# Patient Record
Sex: Female | Born: 1945 | Race: White | Hispanic: No | Smoking: Former smoker
Health system: Southern US, Community
[De-identification: ages and names within clinical notes are randomized; demographics above are authoritative.]

## PROBLEM LIST (undated history)

## (undated) DIAGNOSIS — M797 Fibromyalgia: Secondary | ICD-10-CM

## (undated) DIAGNOSIS — Z8739 Personal history of other diseases of the musculoskeletal system and connective tissue: Secondary | ICD-10-CM

## (undated) DIAGNOSIS — C801 Malignant (primary) neoplasm, unspecified: Secondary | ICD-10-CM

## (undated) DIAGNOSIS — F329 Major depressive disorder, single episode, unspecified: Secondary | ICD-10-CM

## (undated) DIAGNOSIS — I959 Hypotension, unspecified: Secondary | ICD-10-CM

## (undated) DIAGNOSIS — L409 Psoriasis, unspecified: Secondary | ICD-10-CM

## (undated) DIAGNOSIS — G43909 Migraine, unspecified, not intractable, without status migrainosus: Secondary | ICD-10-CM

## (undated) DIAGNOSIS — F32A Depression, unspecified: Secondary | ICD-10-CM

## (undated) DIAGNOSIS — F419 Anxiety disorder, unspecified: Secondary | ICD-10-CM

## (undated) DIAGNOSIS — F3181 Bipolar II disorder: Secondary | ICD-10-CM

## (undated) DIAGNOSIS — M5104 Intervertebral disc disorders with myelopathy, thoracic region: Secondary | ICD-10-CM

## (undated) DIAGNOSIS — E039 Hypothyroidism, unspecified: Secondary | ICD-10-CM

## (undated) DIAGNOSIS — F429 Obsessive-compulsive disorder, unspecified: Secondary | ICD-10-CM

## (undated) DIAGNOSIS — K5900 Constipation, unspecified: Secondary | ICD-10-CM

## (undated) DIAGNOSIS — M810 Age-related osteoporosis without current pathological fracture: Secondary | ICD-10-CM

## (undated) DIAGNOSIS — R011 Cardiac murmur, unspecified: Secondary | ICD-10-CM

## (undated) DIAGNOSIS — M1990 Unspecified osteoarthritis, unspecified site: Secondary | ICD-10-CM

## (undated) DIAGNOSIS — Z85828 Personal history of other malignant neoplasm of skin: Secondary | ICD-10-CM

## (undated) DIAGNOSIS — K219 Gastro-esophageal reflux disease without esophagitis: Secondary | ICD-10-CM

## (undated) DIAGNOSIS — M419 Scoliosis, unspecified: Secondary | ICD-10-CM

## (undated) DIAGNOSIS — S0592XA Unspecified injury of left eye and orbit, initial encounter: Secondary | ICD-10-CM

## (undated) HISTORY — PX: TONSILLECTOMY: SUR1361

## (undated) HISTORY — DX: Migraine, unspecified, not intractable, without status migrainosus: G43.909

## (undated) HISTORY — DX: Fibromyalgia: M79.7

## (undated) HISTORY — PX: KNEE SURGERY: SHX244

## (undated) HISTORY — DX: Hypothyroidism, unspecified: E03.9

## (undated) HISTORY — DX: Obsessive-compulsive disorder, unspecified: F42.9

## (undated) HISTORY — PX: OTHER SURGICAL HISTORY: SHX169

## (undated) HISTORY — PX: MANDIBLE SURGERY: SHX707

## (undated) HISTORY — DX: Scoliosis, unspecified: M41.9

## (undated) HISTORY — DX: Unspecified osteoarthritis, unspecified site: M19.90

## (undated) HISTORY — PX: TUBAL LIGATION: SHX77

## (undated) HISTORY — DX: Major depressive disorder, single episode, unspecified: F32.9

## (undated) HISTORY — DX: Depression, unspecified: F32.A

## (undated) HISTORY — PX: DILATION AND CURETTAGE OF UTERUS: SHX78

## (undated) HISTORY — PX: WRIST SURGERY: SHX841

---

## 1998-10-31 ENCOUNTER — Other Ambulatory Visit: Admission: RE | Admit: 1998-10-31 | Discharge: 1998-10-31 | Payer: Self-pay | Admitting: *Deleted

## 2001-07-06 ENCOUNTER — Ambulatory Visit (HOSPITAL_COMMUNITY): Admission: RE | Admit: 2001-07-06 | Discharge: 2001-07-06 | Payer: Self-pay | Admitting: Internal Medicine

## 2001-07-06 ENCOUNTER — Encounter: Payer: Self-pay | Admitting: Internal Medicine

## 2002-12-25 ENCOUNTER — Emergency Department (HOSPITAL_COMMUNITY): Admission: EM | Admit: 2002-12-25 | Discharge: 2002-12-25 | Payer: Self-pay | Admitting: Emergency Medicine

## 2002-12-25 ENCOUNTER — Encounter: Payer: Self-pay | Admitting: Emergency Medicine

## 2003-04-14 ENCOUNTER — Emergency Department (HOSPITAL_COMMUNITY): Admission: EM | Admit: 2003-04-14 | Discharge: 2003-04-14 | Payer: Self-pay | Admitting: Pediatrics

## 2003-07-18 ENCOUNTER — Emergency Department (HOSPITAL_COMMUNITY): Admission: EM | Admit: 2003-07-18 | Discharge: 2003-07-18 | Payer: Self-pay | Admitting: *Deleted

## 2004-02-27 ENCOUNTER — Other Ambulatory Visit: Admission: RE | Admit: 2004-02-27 | Discharge: 2004-02-27 | Payer: Self-pay | Admitting: Internal Medicine

## 2004-09-01 ENCOUNTER — Ambulatory Visit (HOSPITAL_COMMUNITY): Admission: RE | Admit: 2004-09-01 | Discharge: 2004-09-01 | Payer: Self-pay | Admitting: Internal Medicine

## 2006-03-05 ENCOUNTER — Ambulatory Visit: Payer: Self-pay | Admitting: Psychiatry

## 2006-03-05 ENCOUNTER — Inpatient Hospital Stay (HOSPITAL_COMMUNITY): Admission: AD | Admit: 2006-03-05 | Discharge: 2006-03-19 | Payer: Self-pay | Admitting: Psychiatry

## 2010-11-25 ENCOUNTER — Emergency Department (HOSPITAL_COMMUNITY): Payer: Medicare Other

## 2010-11-25 ENCOUNTER — Emergency Department (HOSPITAL_COMMUNITY)
Admission: EM | Admit: 2010-11-25 | Discharge: 2010-11-26 | Disposition: A | Discharge status: Home / Self Care | Payer: Medicare Other | Attending: Emergency Medicine | Admitting: Emergency Medicine

## 2010-11-25 DIAGNOSIS — Z79899 Other long term (current) drug therapy: Secondary | ICD-10-CM | POA: Insufficient documentation

## 2010-11-25 DIAGNOSIS — IMO0001 Reserved for inherently not codable concepts without codable children: Secondary | ICD-10-CM | POA: Insufficient documentation

## 2010-11-25 DIAGNOSIS — M2550 Pain in unspecified joint: Secondary | ICD-10-CM | POA: Insufficient documentation

## 2010-11-25 DIAGNOSIS — F319 Bipolar disorder, unspecified: Secondary | ICD-10-CM | POA: Insufficient documentation

## 2010-11-25 DIAGNOSIS — F988 Other specified behavioral and emotional disorders with onset usually occurring in childhood and adolescence: Secondary | ICD-10-CM | POA: Insufficient documentation

## 2010-11-25 LAB — POCT I-STAT, CHEM 8
Calcium, Ion: 1.11 mmol/L — ABNORMAL LOW (ref 1.12–1.32)
Creatinine, Ser: 0.8 mg/dL (ref 0.50–1.10)
HCT: 47 % — ABNORMAL HIGH (ref 36.0–46.0)
Hemoglobin: 16 g/dL — ABNORMAL HIGH (ref 12.0–15.0)
Potassium: 4.4 mEq/L (ref 3.5–5.1)
Sodium: 138 mEq/L (ref 135–145)

## 2010-11-25 LAB — CBC
HCT: 43.3 % (ref 36.0–46.0)
MCH: 28.2 pg (ref 26.0–34.0)
MCHC: 34.2 g/dL (ref 30.0–36.0)
Platelets: 261 10*3/uL (ref 150–400)
RBC: 5.24 MIL/uL — ABNORMAL HIGH (ref 3.87–5.11)
RDW: 15.5 % (ref 11.5–15.5)
WBC: 11.8 10*3/uL — ABNORMAL HIGH (ref 4.0–10.5)

## 2010-11-25 LAB — DIFFERENTIAL
Eosinophils Absolute: 0.4 10*3/uL (ref 0.0–0.7)
Eosinophils Relative: 3 % (ref 0–5)
Lymphocytes Relative: 26 % (ref 12–46)
Monocytes Absolute: 0.8 10*3/uL (ref 0.1–1.0)
Monocytes Relative: 7 % (ref 3–12)

## 2010-11-25 LAB — CK: Total CK: 40 U/L (ref 7–177)

## 2010-12-17 ENCOUNTER — Encounter: Payer: Self-pay | Admitting: *Deleted

## 2010-12-18 ENCOUNTER — Encounter: Payer: Self-pay | Admitting: Internal Medicine

## 2010-12-18 ENCOUNTER — Ambulatory Visit (INDEPENDENT_AMBULATORY_CARE_PROVIDER_SITE_OTHER): Payer: Medicare Other | Admitting: Internal Medicine

## 2010-12-18 VITALS — BP 120/88 | HR 85 | Temp 98.0°F | Ht 63.0 in | Wt 146.4 lb

## 2010-12-18 DIAGNOSIS — R0602 Shortness of breath: Secondary | ICD-10-CM

## 2010-12-18 NOTE — Progress Notes (Signed)
Subjective:    Patient ID: Jenna Contreras, female    DOB: 1945-06-01, 65 y.o.   MRN: [id number]  HPI 65 year old female accompanied with husband. States was in hospital ([facility]) with "pneumonia" around early September 2012. Spent 1 week in hospital. At discharge she was informed that the pneumonia had cleared and she no longer needed bronch but needed followup 5 weeks. States she was given a week of levoflox but now realizes that she should not have been given that due to risk of ligament injury. Around 11/25/10 visited [facility] (at this time unhappy with [facility]) due to persistence of dry cough and diffuse bodyache (concerned side effect of levoflox). CXR was clear (personally reviewed). Had CBC, bmet, ck, esr - all normal. Ionized calcium was slightly normal.   Currently being bothered by dyspnea and chronic cough and also states that she forgot to mention she has birds. Reports dyspnea since admission in sept 2012. Feels likely getting worse. Rates it as moderate-severe. Even trying to talk or exertion like getting out of bed makes her dyspneic. Dyspnea improved by nebs. Cough also present since pneumonia. Was productive of brown sputum but now dry. Rates cough as moderate- severe. Coughs during sleep and when changing posture from supine to sitting. There is associated wheezing present.  Denies hemoptysis,  Both preventing her from cleaning birds room (see below)  PMHx 1. Asthma  - diagnosed mid 56s. Unclear how dx was made but she reckons it was based on PEF. Describes cough variant asthma. Hx of "respiratory arrest" in 1998 + but says she refused to go to ER and get Rx. Uses albuterol prn for asthma "attacks". Typicall gets attacks 1-4 times per week upon exposure to birds at home  2. Scoliosis  3. Admitted for depression and OCD in 2007  4. Irritable bowel syndrome.  2. Arthritis.  3. Scoliosis.  4. Migraines.  5. Hypothyroidism.  6. Fibromyalgia.  Social 1. Birds -  parrots with lot of feather dust in one room. Since 2000. Large Macaw and a cocakteel and a quaker. Total 3 birds. Gets exposed atleast 1-2 per week 2. Works as Engineer, site and domestic Corporate treasurer.  3. Husband and she work together in Personnel officer. In [place], [place]. Married x 27 years 4. Ex-smoker. Smoked cigs 15-20  Years x 1 ppd. Quit 35 years ago 5. Denies MJ, coccaine.  6. Severeal grandkids and 1 great grand kid   Review of Systems  Constitutional: Negative for fever and unexpected weight change.  HENT: Negative for ear pain, nosebleeds, congestion, sore throat, rhinorrhea, sneezing, trouble swallowing, dental problem, postnasal drip and sinus pressure.   Eyes: Negative for redness and itching.  Respiratory: Positive for cough and shortness of breath. Negative for chest tightness and wheezing.   Cardiovascular: Negative for palpitations and leg swelling.  Gastrointestinal: Negative for nausea and vomiting.  Genitourinary: Negative for dysuria.  Musculoskeletal: Negative for joint swelling.  Skin: Negative for rash.  Neurological: Negative for headaches.  Hematological: Does not bruise/bleed easily.  Psychiatric/Behavioral: Negative for dysphoric mood. The patient is not nervous/anxious.        Objective:   Physical Exam  Vitals reviewed. Constitutional: She is oriented to person, place, and time. She appears well-developed and well-nourished. No distress.  HENT:  Head: Normocephalic and atraumatic.  Right Ear: External ear normal.  Left Ear: External ear normal.  Mouth/Throat: Oropharynx is clear and moist. No oropharyngeal exudate.  Eyes: Conjunctivae and EOM are  normal. Pupils are equal, round, and reactive to light. Right eye exhibits no discharge. Left eye exhibits no discharge. No scleral icterus.  Neck: Normal range of motion. Neck supple. No JVD present. No tracheal deviation present. No thyromegaly present.    Cardiovascular: Normal rate, regular rhythm, normal heart sounds and intact distal pulses.  Exam reveals no gallop and no friction rub.   No murmur heard. Pulmonary/Chest: Effort normal. No respiratory distress. She has no wheezes. She has rales. She exhibits no tenderness.       Some faint basal crackles  Abdominal: Soft. Bowel sounds are normal. She exhibits no distension and no mass. There is no tenderness. There is no rebound and no guarding.  Musculoskeletal: Normal range of motion. She exhibits no edema and no tenderness.  Lymphadenopathy:    She has no cervical adenopathy.  Neurological: She is alert and oriented to person, place, and time. She has normal reflexes. No cranial nerve deficit. She exhibits normal muscle tone. Coordination normal.  Skin: Skin is warm and dry. No rash noted. She is not diaphoretic. No erythema. No pallor.  Psychiatric: She has a normal mood and affect. Her behavior is normal. Judgment and thought content normal.          Assessment & Plan:

## 2010-12-18 NOTE — Patient Instructions (Signed)
Nurse wil walk you for oxygen levels Have full PFT breathing test - once test done, give [place] a call so I can review you aand advise over phone next step

## 2010-12-30 ENCOUNTER — Ambulatory Visit (INDEPENDENT_AMBULATORY_CARE_PROVIDER_SITE_OTHER): Payer: Medicare Other | Admitting: Internal Medicine

## 2010-12-30 DIAGNOSIS — R0989 Other specified symptoms and signs involving the circulatory and respiratory systems: Secondary | ICD-10-CM

## 2010-12-30 DIAGNOSIS — R0609 Other forms of dyspnea: Secondary | ICD-10-CM

## 2010-12-30 LAB — PULMONARY FUNCTION TEST

## 2010-12-30 NOTE — Progress Notes (Signed)
PFT done today. 

## 2011-01-05 DIAGNOSIS — R0602 Shortness of breath: Secondary | ICD-10-CM | POA: Insufficient documentation

## 2011-01-05 NOTE — Assessment & Plan Note (Signed)
Unclear cause  Plan Nurse wil walk you for oxygen levels Have full PFT breathing test - once test done, give [place] a call so I can review you aand advise over phone next step

## 2011-01-08 ENCOUNTER — Telehealth: Payer: Self-pay | Admitting: Internal Medicine

## 2011-01-08 DIAGNOSIS — J849 Interstitial pulmonary disease, unspecified: Secondary | ICD-10-CM

## 2011-01-08 NOTE — Telephone Encounter (Signed)
PT CALLED BACK AGAIN REQUESTING TO SPEAK TO MR RE: RECENT TIA. WANTS HIM TO CALL HER BACK ASAP. Jenna Contreras

## 2011-01-08 NOTE — Telephone Encounter (Signed)
Called and spoke with pt.  Pt states she just spoke with MR and forgot to mention she had a TIA last week.  Pt wanted this information passed on to MR and would like to discuss this with him directly.  Will forward message to MR

## 2011-01-08 NOTE — Telephone Encounter (Signed)
TIA should not impact getting CT chest. Please hav her do CT chest as before. If it can wait, I will talk to her about tIA after CT chest . Let me know what she says

## 2011-01-08 NOTE — Telephone Encounter (Signed)
PLEASE NOTE: PT ADDS THAT SHE DID SPEAK TO MR EARLIER TODAY BUT SAYS SHE "FORGOT TO TELL HIM SOMETHING AND THAT IT WAS OF AN URGENT MATTER". Jenna Contreras

## 2011-01-08 NOTE — Telephone Encounter (Signed)
I called patient. REviewed PFT 12/30/10. Shows mild isolated reduction in diffusion (71%). This is difficulty with o2 transfer. With bird history and crackles on exam I have recommended a CT scan of chest.   Please arrange high resolution ct chest without contrast to look for ILD (reason: abn PFT, bird exposure, crackles on exam)

## 2011-01-09 NOTE — Telephone Encounter (Signed)
Pt notified and preferred to have the chest CT done at [facility] on [address] [facility]. An order for this has been sent to the [facility] and pt aware they will call her with this appt.

## 2011-01-09 NOTE — Telephone Encounter (Signed)
MRI ordder has to come from PMD. She lvies in [place] area. If she wants both same day then it has to be done at [facility] not at [address] [facility] where only CT can be done. I dont mind her having CT in [employer] but MRI has to be ordered by PMD; I am a pulmonary guy only

## 2011-01-09 NOTE — Telephone Encounter (Signed)
Called and spoke with pt. She states that she is okay with having CT Chest, but she wants to know if MR willing to order MRI of her brain so she can have both done same day. She states that her PCP had previously wanted her to have MRI done, and she had refused at that time but now wants to have both done. Please advise and then I will send order to [facility] for whatever you want to order, thanks!

## 2011-01-12 ENCOUNTER — Encounter: Payer: Self-pay | Admitting: Internal Medicine

## 2011-01-13 ENCOUNTER — Ambulatory Visit (INDEPENDENT_AMBULATORY_CARE_PROVIDER_SITE_OTHER)
Admission: RE | Admit: 2011-01-13 | Discharge: 2011-01-13 | Disposition: A | Discharge status: Home / Self Care | Payer: Medicare Other | Source: Ambulatory Visit | Attending: Internal Medicine | Admitting: Internal Medicine

## 2011-01-13 DIAGNOSIS — J84115 Respiratory bronchiolitis interstitial lung disease: Secondary | ICD-10-CM

## 2011-01-13 DIAGNOSIS — J849 Interstitial pulmonary disease, unspecified: Secondary | ICD-10-CM

## 2011-01-13 NOTE — Telephone Encounter (Signed)
Order placed. Jenna Contreras, CMA  

## 2011-01-14 ENCOUNTER — Telehealth: Payer: Self-pay | Admitting: Internal Medicine

## 2011-01-14 NOTE — Telephone Encounter (Signed)
Updated patient on CT results -   #nomral lung parenchyma. Still with cough and dyspnea.  DDx of post pna cough taking time to resolve explained but she prefers workup now. She does not think that is the case  #small lung nodule < 4mm  Plan as agreed with patient  - get records from Dr Sidney Ace  Esp spirometry, noptes, and allergy testing - [name] please organize this and leave in my look at  - if above are unhelpful, will get Methacholine chalenge and if that is negative will get CPST  - neeeds repeat CT chest wtihout contrast in 1  year

## 2011-01-19 NOTE — Telephone Encounter (Signed)
Pt states she was to call back with her fax number it is: [phone].Jenna Contreras

## 2011-01-19 NOTE — Telephone Encounter (Signed)
Attempted to fax and received error. Form given to TD to give to [facility].

## 2011-01-19 NOTE — Telephone Encounter (Signed)
I spoke with the pt because a release is needed to get records. She lives in [place] and does not drive so I am going  to fax release for her to sign to her husbands office. She will call back with the fax number. Carron Curie, CMA

## 2011-01-20 NOTE — Telephone Encounter (Signed)
I called and verified the pt fax number, she states this is the correct one. She states she will check with her husband and call back. I re-faxed the form to the number given and it did not go through. Will await pt call. Carron Curie, CMA

## 2011-01-26 NOTE — Telephone Encounter (Signed)
I spoke with the pt husband and he verified the fax number. He states he is having his fax machine worked on and to refax the form tomorrow after 10am. If it does not go through I advised that I will mail the release to the pt and she can then fax it back to me. Carron Curie, CMA

## 2011-01-30 NOTE — Telephone Encounter (Signed)
I mailed release to the pt. Jenna Contreras, CMA

## 2011-03-06 ENCOUNTER — Telehealth: Payer: Self-pay | Admitting: Internal Medicine

## 2011-03-06 NOTE — Telephone Encounter (Signed)
I spoke with pt and she states she is wanting to come here and get a PNA shot and flu shot at the same time. Pt states her pcp does not have the PNA shot bc he can;t keep it at the room temp it needs to be. Are you okay with this Dr. Marchelle Gearing. Please advise, thanks

## 2011-03-11 NOTE — Telephone Encounter (Signed)
LMTCB

## 2011-03-11 NOTE — Telephone Encounter (Signed)
That is fine 

## 2011-03-12 NOTE — Telephone Encounter (Signed)
I needed a release of info signed to get the records MR needed and the pt does not drive so she requested I fax the release to her. The fax number the pt gave [place] was not working at the time, so I spoke with the pt and she asked that I mail her a release of information. This was done, but I have not received the release back from the pt. She was supposed to fax it to my attention. (See Phone note from 01-24-11) I LMTCBx1 to ask the pt if she sent the release back to [place]. Carron Curie, CMA

## 2011-03-12 NOTE — Telephone Encounter (Signed)
I spoke with pt and is aware okay to get both PNA and flu vaccine. Pt will be put on allergy scheduled to have this done. Pt is also wanting to know when MR wants her to follow up. Please advise Dr. Marchelle Gearing, thanks

## 2011-03-12 NOTE — Telephone Encounter (Signed)
See phone note 01/14/11 where Jenna Contreras was supposed to coorrdinate getting some infor for me. It all depends on that. I need spirometry data etc. (see 01/14/11)

## 2011-03-12 NOTE — Telephone Encounter (Signed)
Jenna Contreras have you received anything on pt that MR has request. Please advise thanks

## 2011-03-13 ENCOUNTER — Ambulatory Visit (INDEPENDENT_AMBULATORY_CARE_PROVIDER_SITE_OTHER): Payer: Medicare Other

## 2011-03-13 DIAGNOSIS — Z23 Encounter for immunization: Secondary | ICD-10-CM | POA: Diagnosis not present

## 2011-03-13 NOTE — Telephone Encounter (Signed)
Called spoke with patient who stated she mailed the records release back "last week."  Per pt she will be coming by the office shortly for a flu shot and pneumonia shot and offered to sign another one at that time.  Will forward message back to [place] and inform her of this.  Advised pt to let one of the registrars know and we will bring a records release form out to her.  Pt verbalized her understanding.

## 2011-03-19 DIAGNOSIS — Z23 Encounter for immunization: Secondary | ICD-10-CM

## 2011-03-24 DIAGNOSIS — F3162 Bipolar disorder, current episode mixed, moderate: Secondary | ICD-10-CM | POA: Diagnosis not present

## 2011-04-07 DIAGNOSIS — F3162 Bipolar disorder, current episode mixed, moderate: Secondary | ICD-10-CM | POA: Diagnosis not present

## 2011-04-17 DIAGNOSIS — H524 Presbyopia: Secondary | ICD-10-CM | POA: Diagnosis not present

## 2011-04-17 DIAGNOSIS — H2510 Age-related nuclear cataract, unspecified eye: Secondary | ICD-10-CM | POA: Diagnosis not present

## 2011-05-12 DIAGNOSIS — M549 Dorsalgia, unspecified: Secondary | ICD-10-CM | POA: Diagnosis not present

## 2011-05-12 DIAGNOSIS — M129 Arthropathy, unspecified: Secondary | ICD-10-CM | POA: Diagnosis not present

## 2011-05-12 DIAGNOSIS — J156 Pneumonia due to other aerobic Gram-negative bacteria: Secondary | ICD-10-CM | POA: Diagnosis not present

## 2011-05-12 DIAGNOSIS — E039 Hypothyroidism, unspecified: Secondary | ICD-10-CM | POA: Diagnosis not present

## 2011-05-12 DIAGNOSIS — K2900 Acute gastritis without bleeding: Secondary | ICD-10-CM | POA: Diagnosis not present

## 2011-05-12 DIAGNOSIS — H01119 Allergic dermatitis of unspecified eye, unspecified eyelid: Secondary | ICD-10-CM | POA: Diagnosis not present

## 2011-05-12 DIAGNOSIS — F411 Generalized anxiety disorder: Secondary | ICD-10-CM | POA: Diagnosis not present

## 2011-05-12 DIAGNOSIS — J209 Acute bronchitis, unspecified: Secondary | ICD-10-CM | POA: Diagnosis not present

## 2011-05-15 DIAGNOSIS — F3162 Bipolar disorder, current episode mixed, moderate: Secondary | ICD-10-CM | POA: Diagnosis not present

## 2011-05-29 DIAGNOSIS — F331 Major depressive disorder, recurrent, moderate: Secondary | ICD-10-CM | POA: Diagnosis not present

## 2011-05-29 DIAGNOSIS — F3160 Bipolar disorder, current episode mixed, unspecified: Secondary | ICD-10-CM | POA: Diagnosis not present

## 2011-05-29 DIAGNOSIS — F3162 Bipolar disorder, current episode mixed, moderate: Secondary | ICD-10-CM | POA: Diagnosis not present

## 2011-06-19 ENCOUNTER — Encounter (HOSPITAL_COMMUNITY): Payer: Self-pay | Admitting: *Deleted

## 2011-06-19 ENCOUNTER — Emergency Department (HOSPITAL_COMMUNITY)
Admission: EM | Admit: 2011-06-19 | Discharge: 2011-06-20 | Disposition: A | Discharge status: Home / Self Care | Payer: Medicare Other | Attending: Emergency Medicine | Admitting: Emergency Medicine

## 2011-06-19 DIAGNOSIS — J45909 Unspecified asthma, uncomplicated: Secondary | ICD-10-CM | POA: Insufficient documentation

## 2011-06-19 DIAGNOSIS — E039 Hypothyroidism, unspecified: Secondary | ICD-10-CM | POA: Insufficient documentation

## 2011-06-19 DIAGNOSIS — K589 Irritable bowel syndrome without diarrhea: Secondary | ICD-10-CM | POA: Insufficient documentation

## 2011-06-19 DIAGNOSIS — K5900 Constipation, unspecified: Secondary | ICD-10-CM | POA: Diagnosis not present

## 2011-06-19 DIAGNOSIS — Z8739 Personal history of other diseases of the musculoskeletal system and connective tissue: Secondary | ICD-10-CM | POA: Insufficient documentation

## 2011-06-19 NOTE — ED Notes (Signed)
Asked tech first to re-assess vital signs. 

## 2011-06-19 NOTE — ED Notes (Signed)
The pt has not had a bm in 17 days.  She has a history of the same.  seh has irritable bowel

## 2011-06-20 ENCOUNTER — Encounter (HOSPITAL_COMMUNITY): Payer: Self-pay | Admitting: *Deleted

## 2011-06-20 DIAGNOSIS — Z8739 Personal history of other diseases of the musculoskeletal system and connective tissue: Secondary | ICD-10-CM | POA: Diagnosis not present

## 2011-06-20 DIAGNOSIS — E039 Hypothyroidism, unspecified: Secondary | ICD-10-CM | POA: Diagnosis not present

## 2011-06-20 DIAGNOSIS — K5900 Constipation, unspecified: Secondary | ICD-10-CM | POA: Diagnosis not present

## 2011-06-20 DIAGNOSIS — J45909 Unspecified asthma, uncomplicated: Secondary | ICD-10-CM | POA: Diagnosis not present

## 2011-06-20 DIAGNOSIS — K589 Irritable bowel syndrome without diarrhea: Secondary | ICD-10-CM | POA: Diagnosis not present

## 2011-06-20 LAB — POCT I-STAT, CHEM 8
Calcium, Ion: 1.17 mmol/L (ref 1.12–1.32)
HCT: 43 % (ref 36.0–46.0)
TCO2: 25 mmol/L (ref 0–100)

## 2011-06-20 LAB — CBC
MCH: 28.8 pg (ref 26.0–34.0)
MCV: 85.5 fL (ref 78.0–100.0)
RDW: 14.2 % (ref 11.5–15.5)

## 2011-06-20 MED ORDER — POLYETHYLENE GLYCOL 3350 17 GM/SCOOP PO POWD: 17.0000 g | Freq: Every day | ORAL | Status: AC

## 2011-06-20 MED ORDER — FLEET ENEMA 7-19 GM/118ML RE ENEM: 1.0000 | ENEMA | Freq: Once | RECTAL | Status: DC

## 2011-06-20 MED ORDER — SODIUM CHLORIDE 0.9 % IV SOLN
INTRAVENOUS | Status: DC
  Administered 2011-06-20: via INTRAVENOUS

## 2011-06-20 NOTE — ED Provider Notes (Signed)
History     CSN: [id number]  Arrival date & time 06/19/11  1932   First MD Initiated Contact with Patient 06/20/11 0007      Chief Complaint  Patient presents with  . Fecal Impaction    (Consider location/radiation/quality/duration/timing/severity/associated sxs/prior treatment) The history is provided by the patient.   patient states she's not had a bowel movement in 17 days. She has chronic constipation and irritable bowel syndrome and typically does have bowel movements for 5-6 days at a time. No abdominal pain or discomfort. No nausea or vomiting. Patient does feel somewhat bloated. No fevers or chills. No recent travel. No change in medications. She's been trying laxatives. She takes fentanyl Duragesic  for arthritis and pains. She is followed by primary care in [place]. Symptoms moderate in severity. Patient requesting an enema. Patient states she has not been eating a lot of solid foods for last 2 days due to constipation. Past Medical History  Diagnosis Date  . Scoliosis   . Migraines   . IBS (irritable bowel syndrome)   . Arthritis   . Hypothyroidism   . Asthma     Past Surgical History  Procedure Date  . Dilation and curettage of uterus x3    Family History  Problem Relation Age of Onset  . Heart disease Mother   . Diabetes Mother   . Colon cancer Father   . Heart disease Brother     History  Substance Use Topics  . Smoking status: Former Smoker -- 1.0 packs/day    Types: Cigarettes    Quit date: 03/09/1980  . Smokeless tobacco: Not on file  . Alcohol Use: No    OB History    Grav Para Term Preterm Abortions TAB SAB Ect Mult Living                  Review of Systems  Constitutional: Negative for fever and chills.  HENT: Negative for neck pain and neck stiffness.   Eyes: Negative for pain.  Respiratory: Negative for shortness of breath.   Cardiovascular: Negative for chest pain.  Gastrointestinal: Positive for constipation. Negative for nausea,  vomiting, abdominal pain, diarrhea, anal bleeding and rectal pain.  Genitourinary: Negative for dysuria.  Musculoskeletal: Negative for back pain.  Skin: Negative for rash.  Neurological: Negative for headaches.  All other systems reviewed and are negative.    Allergies  Doxycycline; Keflex; Penicillins; and Tetracyclines & related  Home Medications   Current Outpatient Rx  Name Route Sig Dispense Refill  . ALBUTEROL SULFATE HFA 108 (90 BASE) MCG/ACT IN AERS Inhalation Inhale 2 puffs into the lungs every 6 (six) hours as needed. FOR WHEEZING    . DICLOFENAC SODIUM 75 MG PO TBEC Oral Take 75 mg by mouth daily as needed. FOR PAIN    . FENTANYL 25 MCG/HR TD PT72 Transdermal Place 1 patch onto the skin every 3 (three) days.    Marland Kitchen HYDROCODONE-ACETAMINOPHEN 5-325 MG PO TABS Oral Take 1-2 tablets by mouth every 6 (six) hours as needed. FOR PAIN    . LAMOTRIGINE 200 MG PO TABS Oral Take 200 mg by mouth 2 (two) times daily.    Marland Kitchen LEVOTHYROXINE SODIUM 88 MCG PO TABS Oral Take 88 mcg by mouth daily.      Marland Kitchen LORAZEPAM 0.5 MG PO TABS Oral Take 1 mg by mouth at bedtime.     Marland Kitchen RIZATRIPTAN BENZOATE 10 MG PO TABS Oral Take 10 mg by mouth as needed. May repeat in 2 hours  if needed for migraines      BP 122/83  Pulse 80  Temp(Src) 98 F (36.7 C) (Oral)  Resp 16  SpO2 99%  Physical Exam  Constitutional: She is oriented to person, place, and time. She appears well-developed and well-nourished.  HENT:  Head: Normocephalic and atraumatic.  Eyes: Conjunctivae and EOM are normal. Pupils are equal, round, and reactive to light.  Neck: Trachea normal. Neck supple. No thyromegaly present.  Cardiovascular: Normal rate, regular rhythm, S1 normal, S2 normal and normal pulses.     No systolic murmur is present   No diastolic murmur is present  Pulses:      Radial pulses are 2+ on the right side, and 2+ on the left side.  Pulmonary/Chest: Effort normal and breath sounds normal. She has no wheezes. She has  no rhonchi. She has no rales. She exhibits no tenderness.  Abdominal: Soft. Normal appearance and bowel sounds are normal. There is no tenderness. There is no CVA tenderness and negative Murphy's sign.  Musculoskeletal:       BLE:s Calves nontender, no cords or erythema, negative Homans sign  Neurological: She is alert and oriented to person, place, and time. She has normal strength. No cranial nerve deficit or sensory deficit. GCS eye subscore is 4. GCS verbal subscore is 5. GCS motor subscore is 6.  Skin: Skin is warm and dry. No rash noted. She is not diaphoretic.  Psychiatric: Her speech is normal.       Cooperative and appropriate    ED Course  Procedures (including critical care time)   Results for orders placed during the hospital encounter of 06/19/11  CBC      Component Value Range   WBC 8.7  4.0 - 10.5 (K/uL)   RBC 4.90  3.87 - 5.11 (MIL/uL)   Hemoglobin 14.1  12.0 - 15.0 (g/dL)   HCT 16.1  09.6 - 04.5 (%)   MCV 85.5  78.0 - 100.0 (fL)   MCH 28.8  26.0 - 34.0 (pg)   MCHC 33.7  30.0 - 36.0 (g/dL)   RDW 40.9  81.1 - 91.4 (%)   Platelets 217  150 - 400 (K/uL)  POCT I-STAT, CHEM 8      Component Value Range   Sodium 138  135 - 145 (mEq/L)   Potassium 3.6  3.5 - 5.1 (mEq/L)   Chloride 104  96 - 112 (mEq/L)   BUN 26 (*) 6 - 23 (mg/dL)   Creatinine, Ser 7.82  0.50 - 1.10 (mg/dL)   Glucose, Bld 89  70 - 99 (mg/dL)   Calcium, Ion 9.56  2.13 - 1.32 (mmol/L)   TCO2 25  0 - 100 (mmol/L)   Hemoglobin 14.6  12.0 - 15.0 (g/dL)   HCT 08.6  57.8 - 46.9 (%)   IV fluids provided. Fleets enema provided without relief. Soap suds enema provided with min relief   Serial evaluations and abdominal exams without tenderness or peritonitis. Abdomen remains soft with bowel sounds present. No indication for emergent imaging at this time   MDM   Constipation. Plan discharge home with MiraLAX and primary care followup in [place]. Reliable historian and verbalizes understanding constipation  precautions, and all discharge and followup instructions.        Sunnie Nielsen, MD 06/20/11 [phone]

## 2011-06-20 NOTE — ED Notes (Signed)
SPD notified for soap suds enema.

## 2011-06-20 NOTE — ED Notes (Signed)
Assumed care of pt.  No distress noted.  Pt using the bedside commode.

## 2011-06-20 NOTE — Discharge Instructions (Signed)

## 2011-06-29 ENCOUNTER — Other Ambulatory Visit: Payer: Self-pay | Admitting: Obstetrics & Gynecology

## 2011-06-29 ENCOUNTER — Other Ambulatory Visit (HOSPITAL_COMMUNITY)
Admission: RE | Admit: 2011-06-29 | Discharge: 2011-06-29 | Disposition: A | Discharge status: Home / Self Care | Payer: Medicare Other | Source: Ambulatory Visit | Attending: Obstetrics & Gynecology | Admitting: Obstetrics & Gynecology

## 2011-06-29 DIAGNOSIS — Z1389 Encounter for screening for other disorder: Secondary | ICD-10-CM | POA: Diagnosis not present

## 2011-06-29 DIAGNOSIS — Z124 Encounter for screening for malignant neoplasm of cervix: Secondary | ICD-10-CM | POA: Diagnosis not present

## 2011-06-29 DIAGNOSIS — Z1212 Encounter for screening for malignant neoplasm of rectum: Secondary | ICD-10-CM | POA: Diagnosis not present

## 2011-07-03 DIAGNOSIS — F3162 Bipolar disorder, current episode mixed, moderate: Secondary | ICD-10-CM | POA: Diagnosis not present

## 2011-07-03 DIAGNOSIS — K5900 Constipation, unspecified: Secondary | ICD-10-CM | POA: Diagnosis not present

## 2011-07-17 DIAGNOSIS — K5900 Constipation, unspecified: Secondary | ICD-10-CM | POA: Diagnosis not present

## 2011-07-25 DIAGNOSIS — Z1389 Encounter for screening for other disorder: Secondary | ICD-10-CM | POA: Diagnosis not present

## 2011-07-25 DIAGNOSIS — K5900 Constipation, unspecified: Secondary | ICD-10-CM | POA: Diagnosis not present

## 2011-07-28 ENCOUNTER — Telehealth: Payer: Self-pay | Admitting: Internal Medicine

## 2011-07-28 NOTE — Telephone Encounter (Signed)
She signed  A release back in dec 2012 to get recoreds from Dr Sidney Ace ([facility] [facility]) office but they never got here I thinhk. Anyways, beeen > 6 monhs since I saw. Give first available at convenience and I can go over problems

## 2011-07-28 NOTE — Telephone Encounter (Signed)
I spoke with pt and she states she is not sure when she is suppose to follow up with MR. She states she ahd all the test she needed to have done and then was never told when to f/u. Pt is going to have a colonoscopy scheduled soon bc she had not a BM since 05/28/11. Pt is going to see GI tomorrow. Please advise regarding apt Dr. Marchelle Gearing, thanks

## 2011-07-29 DIAGNOSIS — K5900 Constipation, unspecified: Secondary | ICD-10-CM | POA: Diagnosis not present

## 2011-07-29 NOTE — Telephone Encounter (Signed)
LMTCB x 1 

## 2011-07-30 NOTE — Telephone Encounter (Signed)
Lmtcbx2. Darlyn Repsher, CMA  

## 2011-07-31 ENCOUNTER — Telehealth: Payer: Self-pay | Admitting: Internal Medicine

## 2011-07-31 NOTE — Telephone Encounter (Signed)
ATC the pt. Someone answered the phone but did not speak, was unable to speak with pt or leave msg so will hold in triage and try later

## 2011-07-31 NOTE — Telephone Encounter (Signed)
Called and spoke with pt and very confused about why the pt left the message.  She stated that the other doctor told her that they would not be putting her to sleep for this and everything should be ok.  Pt informed me to just put this info in this message to let them know.  Message has been taken and nothing further needed from the pt.  Will sign off of this message.

## 2011-07-31 NOTE — Telephone Encounter (Signed)
OV with MR for 08/05/11 at 1:45 pm.

## 2011-08-04 DIAGNOSIS — F411 Generalized anxiety disorder: Secondary | ICD-10-CM | POA: Diagnosis not present

## 2011-08-04 DIAGNOSIS — K648 Other hemorrhoids: Secondary | ICD-10-CM | POA: Diagnosis not present

## 2011-08-04 DIAGNOSIS — F329 Major depressive disorder, single episode, unspecified: Secondary | ICD-10-CM | POA: Diagnosis not present

## 2011-08-04 DIAGNOSIS — E039 Hypothyroidism, unspecified: Secondary | ICD-10-CM | POA: Diagnosis not present

## 2011-08-04 DIAGNOSIS — Z79899 Other long term (current) drug therapy: Secondary | ICD-10-CM | POA: Diagnosis not present

## 2011-08-04 DIAGNOSIS — Z800 Family history of malignant neoplasm of digestive organs: Secondary | ICD-10-CM | POA: Diagnosis not present

## 2011-08-04 DIAGNOSIS — K5900 Constipation, unspecified: Secondary | ICD-10-CM | POA: Diagnosis not present

## 2011-08-04 DIAGNOSIS — Z8679 Personal history of other diseases of the circulatory system: Secondary | ICD-10-CM | POA: Diagnosis not present

## 2011-08-04 DIAGNOSIS — J45909 Unspecified asthma, uncomplicated: Secondary | ICD-10-CM | POA: Diagnosis not present

## 2011-08-05 ENCOUNTER — Encounter: Payer: Self-pay | Admitting: Internal Medicine

## 2011-08-05 ENCOUNTER — Ambulatory Visit (INDEPENDENT_AMBULATORY_CARE_PROVIDER_SITE_OTHER): Payer: Medicare Other | Admitting: Internal Medicine

## 2011-08-05 VITALS — BP 108/66 | HR 80 | Temp 97.1°F | Ht 61.0 in | Wt 136.2 lb

## 2011-08-05 DIAGNOSIS — R413 Other amnesia: Secondary | ICD-10-CM | POA: Insufficient documentation

## 2011-08-05 DIAGNOSIS — R911 Solitary pulmonary nodule: Secondary | ICD-10-CM | POA: Diagnosis not present

## 2011-08-05 DIAGNOSIS — G459 Transient cerebral ischemic attack, unspecified: Secondary | ICD-10-CM

## 2011-08-05 DIAGNOSIS — J4530 Mild persistent asthma, uncomplicated: Secondary | ICD-10-CM | POA: Insufficient documentation

## 2011-08-05 DIAGNOSIS — R062 Wheezing: Secondary | ICD-10-CM

## 2011-08-05 NOTE — Patient Instructions (Signed)
#  Memory issues  - will refer to neurology   #Wheezing  - have methacholine challenge test   #Followup  - will call with result

## 2011-08-05 NOTE — Assessment & Plan Note (Signed)
Hx of asthma frm 1990s but those records not available. Reporting chronic wheezing with bird exposure. CT chest end 2012 no ILD. Therefore, will get methacholine challenge test and reassess

## 2011-08-05 NOTE — Assessment & Plan Note (Signed)
Acute memory issues without focal deficit that she is calling it as TIA. Will refer to neurology for eval

## 2011-08-05 NOTE — Assessment & Plan Note (Signed)
<   4mm seen Nov 2012 CT chest  Plan  repeat chst nov 2013

## 2011-08-05 NOTE — Progress Notes (Signed)
Subjective:    Patient ID: Jenna Contreras, female    DOB: Jul 19, 1945, 66 y.o.   MRN: [id number]  HPI IOV Nov 6567:  66 year old female accompanied with husband. States was in hospital ([facility]) with "pneumonia" around early September 2012. Spent 1 week in hospital. At discharge she was informed that the pneumonia had cleared and she no longer needed bronch but needed followup 5 weeks. States she was given a week of levoflox but now realizes that she should not have been given that due to risk of ligament injury. Around 11/25/10 visited [place] (at this time unhappy with [facility]) due to persistence of dry cough and diffuse bodyache (concerned side effect of levoflox). CXR was clear (personally reviewed). Had CBC, bmet, ck, esr - all normal. Ionized calcium was slightly normal.   Currently being bothered by dyspnea and chronic cough and also states that she forgot to mention she has birds. Reports dyspnea since admission in sept 2012. Feels likely getting worse. Rates it as moderate-severe. Even trying to talk or exertion like getting out of bed makes her dyspneic. Dyspnea improved by nebs. Cough also present since pneumonia. Was productive of brown sputum but now dry. Rates cough as moderate- severe. Coughs during sleep and when changing posture from supine to sitting. There is associated wheezing present.  Denies hemoptysis,  Both preventing her from cleaning birds room (see below)  PMHx 1. Asthma  - diagnosed mid 73s. Unclear how dx was made but she reckons it was based on PEF. Describes cough variant asthma. Hx of "respiratory arrest" in 1998 + but says she refused to go to ER and get Rx. Uses albuterol prn for asthma "attacks". Typicall gets attacks 1-4 times per week upon exposure to birds at home  2. Scoliosis  3. Admitted for depression and OCD in 2007  4. Irritable bowel syndrome.  2. Arthritis.  3. Scoliosis.  4. Migraines.  5. Hypothyroidism.  6.  Fibromyalgia.  Social 1. Birds - parrots with lot of feather dust in one room. Since 2000. Large Macaw and a cocakteel and a quaker. Total 3 birds. Gets exposed atleast 1-2 per week 2. Works as Engineer, site and domestic Corporate treasurer.  3. Husband and she work together in Personnel officer. In [place], [place]. Married x 27 years 4. Ex-smoker. Smoked cigs 15-20  Years x 1 ppd. Quit 35 years ago 5. Denies MJ, coccaine.  6. Severeal grandkids and 1 great grand kid    Nurse wil walk you for oxygen levels  Have full PFT breathing test - once test done, give [place] a call so I can review you aand advise over phone next step  Telephone call Nov 2012 Updated patient on CT results -   #nomral lung parenchyma. Still with cough and dyspnea.  DDx of post pna cough taking time to resolve explained but she prefers workup now. She does not think that is the case  #small lung nodule < 4mm  Plan as agreed with patient  - get records from Dr Sidney Ace  Esp spirometry, noptes, and allergy testing - [name] please organize this and leave in my look at  - if above are unhelpful, will get Methacholine chalenge and if that is negative will get CPST  - neeeds repeat CT chest wtihout contrast in 1  year     OV 08/05/2011  In terms of dyspnea and cough - lot better, almost gone. Has not had any asthma attacks since sept  2012. Not on any maintenance mdi. Learning today not seen Dr Laneta Simmers since 1996; so no records on details of the asthma diagnosis.Marland Kitchen However, she does report wheezing oonce daily with exposure to her birds. No associaed symptoms. No relieving factors   Past, Family, Social reviewed: reported TIA episodes 2 time since last visit. Says PMD refused to get CT brain and therefore wants me to address this isuseu. Most recent TIA 4 weeks ago was very upset with husband. Feels bp rose and next day could not remember peoples names and had word amnesia. Still  struggling with remembering. No focal weakness. Just memory issues.    Current outpatient prescriptions:albuterol (PROVENTIL HFA;VENTOLIN HFA) 108 (90 BASE) MCG/ACT inhaler, Inhale 2 puffs into the lungs every 6 (six) hours as needed. FOR WHEEZING, Disp: , Rfl: ;  diclofenac (VOLTAREN) 75 MG EC tablet, Take 75 mg by mouth daily as needed. FOR PAIN, Disp: , Rfl: ;  fentaNYL (DURAGESIC - DOSED MCG/HR) 25 MCG/HR, Place 1 patch onto the skin every 3 (three) days., Disp: , Rfl:  HYDROcodone-acetaminophen (NORCO) 5-325 MG per tablet, Take 1-2 tablets by mouth every 6 (six) hours as needed. FOR PAIN, Disp: , Rfl: ;  lamoTRIgine (LAMICTAL) 200 MG tablet, Take 200 mg by mouth 2 (two) times daily., Disp: , Rfl: ;  levothyroxine (SYNTHROID, LEVOTHROID) 88 MCG tablet, Take 88 mcg by mouth daily.  , Disp: , Rfl: ;  LORazepam (ATIVAN) 0.5 MG tablet, Take 1 mg by mouth at bedtime. , Disp: , Rfl:  rizatriptan (MAXALT) 10 MG tablet, Take 10 mg by mouth as needed. May repeat in 2 hours if needed for migraines, Disp: , Rfl:     Review of Systems  Constitutional: Negative for fever and unexpected weight change.  HENT: Negative for ear pain, nosebleeds, congestion, sore throat, rhinorrhea, sneezing, trouble swallowing, dental problem, postnasal drip and sinus pressure.   Eyes: Negative for redness and itching.  Respiratory: Negative for cough, chest tightness, shortness of breath and wheezing.   Cardiovascular: Negative for palpitations and leg swelling.  Gastrointestinal: Negative for nausea and vomiting.  Genitourinary: Negative for dysuria.  Musculoskeletal: Negative for joint swelling.  Skin: Negative for rash.  Neurological: Negative for headaches.  Hematological: Does not bruise/bleed easily.  Psychiatric/Behavioral: Negative for dysphoric mood. The patient is not nervous/anxious.        Objective:   Physical Exam Vitals reviewed. Constitutional: She is oriented to person, place, and time. She appears  well-developed and well-nourished. No distress.  HENT:  Head: Normocephalic and atraumatic.  Right Ear: External ear normal.  Left Ear: External ear normal.  Mouth/Throat: Oropharynx is clear and moist. No oropharyngeal exudate.  Eyes: Conjunctivae and EOM are normal. Pupils are equal, round, and reactive to light. Right eye exhibits no discharge. Left eye exhibits no discharge. No scleral icterus.  Neck: Normal range of motion. Neck supple. No JVD present. No tracheal deviation present. No thyromegaly present.  Cardiovascular: Normal rate, regular rhythm, normal heart sounds and intact distal pulses.  Exam reveals no gallop and no friction rub.   No murmur heard. Pulmonary/Chest: Effort normal. No respiratory distress. She has no wheezes.  She exhibits no tenderness.       Abdominal: Soft. Bowel sounds are normal. She exhibits no distension and no mass. There is no tenderness. There is no rebound and no guarding.  Musculoskeletal: Normal range of motion. She exhibits no edema and no tenderness.  Lymphadenopathy:    She has no cervical adenopathy.  Neurological: She  is alert and oriented to person, place, and time. She has normal reflexes. No cranial nerve deficit. She exhibits normal muscle tone. Coordination normal.  Skin: Skin is warm and dry. No rash noted. She is not diaphoretic. No erythema. No pallor.  Psychiatric: She has a normal mood and affect. Her behavior is normal. Judgment and thought content normal.             Assessment & Plan:

## 2011-08-07 DIAGNOSIS — F3162 Bipolar disorder, current episode mixed, moderate: Secondary | ICD-10-CM | POA: Diagnosis not present

## 2011-08-10 ENCOUNTER — Telehealth: Payer: Self-pay | Admitting: Internal Medicine

## 2011-08-10 ENCOUNTER — Encounter (HOSPITAL_COMMUNITY): Payer: Medicare Other

## 2011-08-10 NOTE — Telephone Encounter (Signed)
Pt given # to call and reschedule her methacholine challenge test Tobe Sos

## 2011-08-12 ENCOUNTER — Ambulatory Visit (HOSPITAL_COMMUNITY)
Admission: RE | Admit: 2011-08-12 | Discharge: 2011-08-12 | Disposition: A | Discharge status: Home / Self Care | Payer: Medicare Other | Source: Ambulatory Visit | Attending: Internal Medicine | Admitting: Internal Medicine

## 2011-08-12 DIAGNOSIS — R062 Wheezing: Secondary | ICD-10-CM | POA: Insufficient documentation

## 2011-08-12 LAB — PULMONARY FUNCTION TEST

## 2011-08-12 MED ORDER — ALBUTEROL SULFATE (5 MG/ML) 0.5% IN NEBU
2.5000 mg | INHALATION_SOLUTION | Freq: Once | RESPIRATORY_TRACT | Status: AC
  Administered 2011-08-12: 2.5 mg via RESPIRATORY_TRACT

## 2011-08-12 MED ORDER — METHACHOLINE 4 MG/ML NEB SOLN
2.0000 mL | Freq: Once | RESPIRATORY_TRACT | Status: AC
  Administered 2011-08-12: 8 mg via RESPIRATORY_TRACT

## 2011-08-12 MED ORDER — METHACHOLINE 16 MG/ML NEB SOLN: 2.0000 mL | Freq: Once | RESPIRATORY_TRACT | Status: DC

## 2011-08-12 MED ORDER — METHACHOLINE 0.0625 MG/ML NEB SOLN
2.0000 mL | Freq: Once | RESPIRATORY_TRACT | Status: AC
  Administered 2011-08-12: 0.125 mg via RESPIRATORY_TRACT

## 2011-08-12 MED ORDER — METHACHOLINE 1 MG/ML NEB SOLN
2.0000 mL | Freq: Once | RESPIRATORY_TRACT | Status: AC
  Administered 2011-08-12: 2 mg via RESPIRATORY_TRACT

## 2011-08-12 MED ORDER — SODIUM CHLORIDE 0.9 % IN NEBU
3.0000 mL | INHALATION_SOLUTION | Freq: Once | RESPIRATORY_TRACT | Status: AC
  Administered 2011-08-12: 3 mL via RESPIRATORY_TRACT

## 2011-08-12 MED ORDER — METHACHOLINE 0.25 MG/ML NEB SOLN
2.0000 mL | Freq: Once | RESPIRATORY_TRACT | Status: AC
  Administered 2011-08-12: 0.5 mg via RESPIRATORY_TRACT

## 2011-08-17 ENCOUNTER — Encounter: Payer: Self-pay | Admitting: Neurology

## 2011-08-17 ENCOUNTER — Telehealth: Payer: Self-pay | Admitting: Internal Medicine

## 2011-08-17 NOTE — Telephone Encounter (Signed)
[name]  pls set up fu first avail is fine as long as < 3 weeks to discuss positive methacholine challenge  THanks  MR  For my perusal below  - 25% reducction in fvc and 28% reductiion in fev1 at dose - done 08/12/11

## 2011-08-18 NOTE — Telephone Encounter (Signed)
appt set for 08-26-11 at 12:00 noon. Carron Curie, CMA

## 2011-08-26 ENCOUNTER — Ambulatory Visit: Payer: Medicare Other | Admitting: Internal Medicine

## 2011-08-26 ENCOUNTER — Telehealth: Payer: Self-pay | Admitting: Internal Medicine

## 2011-08-26 NOTE — Telephone Encounter (Signed)
I put patient on Thursday 08-27-11 at 4:15pm. Pt aware and will be here around 4 pm.

## 2011-08-27 ENCOUNTER — Ambulatory Visit: Payer: Medicare Other | Admitting: Internal Medicine

## 2011-08-28 ENCOUNTER — Telehealth: Payer: Self-pay | Admitting: Internal Medicine

## 2011-08-28 DIAGNOSIS — M549 Dorsalgia, unspecified: Secondary | ICD-10-CM | POA: Diagnosis not present

## 2011-08-28 NOTE — Telephone Encounter (Signed)
She cancelled the ov with MR for 6-20 and also on 6/19.  She can see TP on Monday 08-31-11 if ill LMTCB

## 2011-08-29 DIAGNOSIS — F3162 Bipolar disorder, current episode mixed, moderate: Secondary | ICD-10-CM | POA: Diagnosis not present

## 2011-08-31 NOTE — Telephone Encounter (Signed)
Called, spoke with pt who states she was supposed to be seen on 6/19 to discuss test results but was running late.  She was rescheduled to 08/27/11 but then her husband's car broke down.  She has pending OV with MR on September 03, 2011 at 11:15 am to f/u on these results and is aware of this appt date and time.  She wanted to know if MR had cancellations prior to this date -- advised at this time he does not but she can call back to see if something becomes available as we do not have a cancellation list.  Also, advised if she is sick prior to this appt, she can see TP or another dr.  She verbalized understanding of this and would like to keep pending OV with MR on June 27 at this time.  She will call back if anything is needed prior to this.  Nothing further needed at this time.

## 2011-09-02 DIAGNOSIS — H3400 Transient retinal artery occlusion, unspecified eye: Secondary | ICD-10-CM | POA: Diagnosis not present

## 2011-09-03 ENCOUNTER — Ambulatory Visit (INDEPENDENT_AMBULATORY_CARE_PROVIDER_SITE_OTHER): Payer: Medicare Other | Admitting: Internal Medicine

## 2011-09-03 ENCOUNTER — Encounter: Payer: Self-pay | Admitting: Internal Medicine

## 2011-09-03 VITALS — BP 98/62 | HR 73 | Temp 98.6°F | Ht 61.0 in | Wt 141.0 lb

## 2011-09-03 DIAGNOSIS — J45909 Unspecified asthma, uncomplicated: Secondary | ICD-10-CM

## 2011-09-03 DIAGNOSIS — J4530 Mild persistent asthma, uncomplicated: Secondary | ICD-10-CM

## 2011-09-03 MED ORDER — BECLOMETHASONE DIPROPIONATE 80 MCG/ACT IN AERS: 2.0000 | INHALATION_SPRAY | Freq: Two times a day (BID) | RESPIRATORY_TRACT | Status: DC

## 2011-09-03 MED ORDER — AEROCHAMBER Z-STAT PLUS CHAMBR MISC: Status: DC

## 2011-09-03 MED ORDER — AZITHROMYCIN 250 MG PO TABS: ORAL_TABLET | ORAL | Status: DC

## 2011-09-03 NOTE — Progress Notes (Signed)
Subjective:    Patient ID: Jenna Contreras, female    DOB: [birth year], 66 y.o.   MRN: [id number]  HPI IOV Nov 5175:  66 year old female accompanied with husband. States was in hospital ([facility]) with "pneumonia" around early September 2012. Spent 1 week in hospital. At discharge she was informed that the pneumonia had cleared and she no longer needed bronch but needed followup 5 weeks. States she was given a week of levoflox but now realizes that she should not have been given that due to risk of ligament injury. Around 11/25/10 visited [place] (at this time unhappy with [facility]) due to persistence of dry cough and diffuse bodyache (concerned side effect of levoflox). CXR was clear (personally reviewed). Had CBC, bmet, ck, esr - all normal. Ionized calcium was slightly normal.   Currently being bothered by dyspnea and chronic cough and also states that she forgot to mention she has birds. Reports dyspnea since admission in sept 2012. Feels likely getting worse. Rates it as moderate-severe. Even trying to talk or exertion like getting out of bed makes her dyspneic. Dyspnea improved by nebs. Cough also present since pneumonia. Was productive of brown sputum but now dry. Rates cough as moderate- severe. Coughs during sleep and when changing posture from supine to sitting. There is associated wheezing present.  Denies hemoptysis,  Both preventing her from cleaning birds room (see below)  PMHx 1. Asthma  - diagnosed mid 59s. Unclear how dx was made but she reckons it was based on PEF. Describes cough variant asthma. Hx of "respiratory arrest" in 1998 + but says she refused to go to ER and get Rx. Uses albuterol prn for asthma "attacks". Typicall gets attacks 1-4 times per week upon exposure to birds at home  2. Scoliosis  3. Admitted for depression and OCD in 2007  4. Irritable bowel syndrome.  2. Arthritis.  3. Scoliosis.  4. Migraines.  5. Hypothyroidism.  6.  Fibromyalgia.  Social 1. Birds - parrots with lot of feather dust in one room. Since 2000. Large Macaw and a cocakteel and a quaker. Total 3 birds. Gets exposed atleast 1-2 per week 2. Works as Engineer, site and domestic Corporate treasurer.  3. Husband and she work together in Personnel officer. In [place], [place]. Married x 27 years 4. Ex-smoker. Smoked cigs 15-20  Years x 1 ppd. Quit 35 years ago 5. Denies MJ, coccaine.  6. Severeal grandkids and 1 great grand kid    Nurse wil walk you for oxygen levels  Have full PFT breathing test - once test done, give [place] a call so I can review you aand advise over phone next step  Telephone call Nov 2012 Updated patient on CT results NOv 2012 -   #nomral lung parenchyma. Still with cough and dyspnea.  DDx of post pna cough taking time to resolve explained but she prefers workup now. She does not think that is the case  #small lung nodule < 4mm  Plan as agreed with patient  - get records from Dr Sidney Ace  Esp spirometry, noptes, and allergy testing - [name] please organize this and leave in my look at  - if above are unhelpful, will get Methacholine chalenge and if that is negative will get CPST  - neeeds repeat CT chest wtihout contrast in 1  year     OV 08/05/2011  In terms of dyspnea and cough - lot better, almost gone. Has not had any asthma attacks  since sept 2012. Not on any maintenance mdi. Learning today not seen Dr Laneta Simmers since 1996; so no records on details of the asthma diagnosis.Marland Kitchen However, she does report wheezing oonce daily with exposure to her birds. No associaed symptoms. No relieving factors   Past, Family, Social reviewed: reported TIA episodes 2 time since last visit. Says PMD refused to get CT brain and therefore wants me to address this isuseu. Most recent TIA 4 weeks ago was very upset with husband. Feels bp rose and next day could not remember peoples names and had word amnesia.  Still struggling with remembering. No focal weakness. Just memory issues.  #Memory issues  - will refer to neurology  #Wheezing  - have methacholine challenge test  #Followup  - will call with result  OV 09/03/2011 Here to discuss methacholine challenge results of 08/12/11:  - 25% reducction in fvc and 28% reductiion in fev1 at dose - and c/w asthma/ Her interimittent wheeze continues and is of moderate severity. We discussed her birds: clearly she is very emotionally involved about her birds because she has had them > 10 years. There  Is lot of bird dust exposure but she says they talk to her and are emotionally dependent on her.  Otherwise no new resp issues  Past, Family, Social reviewed: no change since last visit but intermittent "tia" symptoms of blurred vision continue. Neuro appt is only 10/03/11 per hx. She interested in speeding this up.    Current outpatient prescriptions:albuterol (PROVENTIL HFA;VENTOLIN HFA) 108 (90 BASE) MCG/ACT inhaler, Inhale 2 puffs into the lungs every 6 (six) hours as needed. FOR WHEEZING, Disp: , Rfl: ;  fentaNYL (DURAGESIC - DOSED MCG/HR) 25 MCG/HR, Place 1 patch onto the skin every 3 (three) days., Disp: , Rfl: ;  HYDROcodone-acetaminophen (NORCO) 5-325 MG per tablet, Take 1-2 tablets by mouth every 6 (six) hours as needed. FOR PAIN, Disp: , Rfl:  lamoTRIgine (LAMICTAL) 200 MG tablet, Take 200 mg by mouth 2 (two) times daily., Disp: , Rfl: ;  levothyroxine (SYNTHROID, LEVOTHROID) 88 MCG tablet, Take 88 mcg by mouth daily.  , Disp: , Rfl: ;  LORazepam (ATIVAN) 0.5 MG tablet, Take 1 mg by mouth at bedtime. , Disp: , Rfl: ;  rizatriptan (MAXALT) 10 MG tablet, Take 10 mg by mouth as needed. May repeat in 2 hours if needed for migraines, Disp: , Rfl:    Review of Systems  Constitutional: Negative for fever and unexpected weight change.  HENT: Positive for postnasal drip. Negative for ear pain, nosebleeds, congestion, sore throat, rhinorrhea, sneezing, trouble  swallowing, dental problem and sinus pressure.   Eyes: Negative for redness and itching.  Respiratory: Positive for cough. Negative for chest tightness, shortness of breath and wheezing.   Cardiovascular: Negative for palpitations and leg swelling.  Gastrointestinal: Negative for nausea and vomiting.  Genitourinary: Negative for dysuria.  Musculoskeletal: Negative for joint swelling.  Skin: Negative for rash.  Neurological: Positive for headaches.  Hematological: Does not bruise/bleed easily.  Psychiatric/Behavioral: Negative for dysphoric mood. The patient is not nervous/anxious.        Objective:   Physical Exam Vitals reviewed. Constitutional: She is oriented to person, place, and time. She appears well-developed and well-nourished. No distress.  HENT:  Head: Normocephalic and atraumatic.  Right Ear: External ear normal.  Left Ear: External ear normal.  Mouth/Throat: Oropharynx is clear and moist. No oropharyngeal exudate.  Eyes: Conjunctivae and EOM are normal. Pupils are equal, round, and reactive to light. Right eye  exhibits no discharge. Left eye exhibits no discharge. No scleral icterus.  Neck: Normal range of motion. Neck supple. No JVD present. No tracheal deviation present. No thyromegaly present.  Cardiovascular: Normal rate, regular rhythm, normal heart sounds and intact distal pulses.  Exam reveals no gallop and no friction rub.   No murmur heard. Pulmonary/Chest: Effort normal. No respiratory distress. She has no wheezes.  She exhibits no tenderness.       Abdominal: Soft. Bowel sounds are normal. She exhibits no distension and no mass. There is no tenderness. There is no rebound and no guarding.  Musculoskeletal: Normal range of motion. She exhibits no edema and no tenderness.  Lymphadenopathy:    She has no cervical adenopathy.  Neurological: She is alert and oriented to person, place, and time. She has normal reflexes. No cranial nerve deficit. She exhibits normal  muscle tone. Coordination normal.  Skin: Skin is warm and dry. No rash noted. She is not diaphoretic. No erythema. No pallor.  Psychiatric: She has a normal mood and affect. Her behavior is normal. Judgment and thought content normal.             Assessment & Plan:

## 2011-09-03 NOTE — Patient Instructions (Addendum)
#  ASthma  - methacholine challenge test consistent with asthma  - start qvar 2 puff twice daily with spacer; nurse will give sample and teach technique and give script - understand that cutting down bird exposure is difficult; we will closely monitor your lung health  #Followup  - 3 months or sooner for asthma followup - our coordinator will see if neurologist can see you sooner than July 27th, 2013

## 2011-09-03 NOTE — Assessment & Plan Note (Signed)
She has moderate persistent symptoms but spirometry/pft normal. So I would rate her overall has mild persistnet asthma based on symptoms and methacholine challenge test. She is in no mooed to reduce bird exposure. She is not even interested in wearing a mask when she is with them. She has agreed to medical optimization of asthma asa first step. Note: There is no evidence of Hypersensitivity pneumonitis based on clear ct lungs fall 2012. Will start her on qvar with spacer and reassess control.  > 50% of this 15 min visit spent in face to face counseling

## 2011-09-18 DIAGNOSIS — F3162 Bipolar disorder, current episode mixed, moderate: Secondary | ICD-10-CM | POA: Diagnosis not present

## 2011-09-29 ENCOUNTER — Encounter: Payer: Self-pay | Admitting: Cardiology

## 2011-09-29 ENCOUNTER — Ambulatory Visit (INDEPENDENT_AMBULATORY_CARE_PROVIDER_SITE_OTHER): Payer: Medicare Other | Admitting: Cardiology

## 2011-09-29 VITALS — BP 115/74 | HR 81 | Ht 61.0 in | Wt 133.8 lb

## 2011-09-29 DIAGNOSIS — G459 Transient cerebral ischemic attack, unspecified: Secondary | ICD-10-CM

## 2011-09-29 DIAGNOSIS — Z136 Encounter for screening for cardiovascular disorders: Secondary | ICD-10-CM | POA: Diagnosis not present

## 2011-09-29 NOTE — Assessment & Plan Note (Signed)
Based on history, possibly at least 2 events over the last 6 months. No history of cardiac dysrhythmia, hypertension, or significant vascular disease. Her ECG is normal. No obvious carotid bruits on examination, although they have not been assessed formally. We willl arrange a carotid Doppler study, hopefully for the same day that she sees Dr. Modesto Charon in [place] so that he could potentially have the results to go over with her. Otherwise we will inform her of the results.

## 2011-09-29 NOTE — Patient Instructions (Addendum)
Your physician recommends that you schedule a follow-up appointment as needed. Your physician recommends that you continue on your current medications as directed. Please refer to the Current Medication list given to you today.  Your physician has requested that you have a carotid duplex. This test is an ultrasound of the carotid arteries in your neck. It looks at blood flow through these arteries that supply the brain with blood. Allow one hour for this exam. There are no restrictions or special instructions.

## 2011-09-29 NOTE — Progress Notes (Signed)
Clinical Summary [name] is a 66 y.o.female referred for cardiology consultation with Dr. Roger Kill. She reports a history of TIAs, possibly at least 2 events dating back to September of last year. Describes feeling of confusion, memory deficit, also intermittent visual change. Last episode was a month ago when she was seen by her optometrist with question of amaurosis fugax. She is referred to [place] for evaluation of her carotids. She has not undergone any previous carotid Dopplers. It also sounds as if she has pending Neurology consultation with Dr. Modesto Charon.  She reports no history of cardiac dysrhythmia, CAD, or myocardial infarction. Medications are outlined below. Her ECG today is normal.   Allergies  Allergen Reactions  . Levaquin (Levofloxacin In D5w)     Painful joints   . Cephalexin Itching and Rash  . Doxycycline Itching and Rash  . Penicillins Itching and Rash  . Tetracyclines & Related Itching and Rash    Current Outpatient Prescriptions  Medication Sig Dispense Refill  . albuterol (PROVENTIL HFA;VENTOLIN HFA) 108 (90 BASE) MCG/ACT inhaler Inhale 2 puffs into the lungs every 6 (six) hours as needed. FOR WHEEZING      . clonazePAM (KLONOPIN) 1 MG tablet Take 1 mg by mouth as needed.      . fentaNYL (DURAGESIC - DOSED MCG/HR) 50 MCG/HR Place 1 patch onto the skin every 3 (three) days.      Marland Kitchen HYDROcodone-acetaminophen (NORCO) 5-325 MG per tablet Take 1-2 tablets by mouth every 6 (six) hours as needed. FOR PAIN      . lamoTRIgine (LAMICTAL) 200 MG tablet Take 200 mg by mouth 2 (two) times daily.      Marland Kitchen levothyroxine (SYNTHROID, LEVOTHROID) 88 MCG tablet Take 88 mcg by mouth daily.        Marland Kitchen LORazepam (ATIVAN) 0.5 MG tablet Take 1 mg by mouth at bedtime.       Marland Kitchen Spacer/Aero-Holding Chambers (AEROCHAMBER Z-STAT PLUS CHAMBR) MISC As directed  1 each  0  . rizatriptan (MAXALT) 10 MG tablet Take 10 mg by mouth as needed. May repeat in 2 hours if needed for migraines        Past  Medical History  Diagnosis Date  . Scoliosis   . Migraines   . IBS (irritable bowel syndrome)   . Arthritis   . Hypothyroidism   . Asthma   . Fibromyalgia   . Depression   . OCD (obsessive compulsive disorder)     Past Surgical History  Procedure Date  . Dilation and curettage of uterus x3  . Tubal ligation   . Arthroscopic left knee surgery   . Wrist surgery   . Mandible surgery     Family History  Problem Relation Age of Onset  . Heart disease Mother   . Diabetes Mother   . Colon cancer Father   . Heart disease Brother     Social History [name] reports that she quit smoking about 31 years ago. Her smoking use included Cigarettes. She has a 38 pack-year smoking history. She does not have any smokeless tobacco history on file. Ms. Krist reports that she does not drink alcohol.  Review of Systems No palpitations. No chest pain. No syncope.  Asthma control reported to be fairly good recently. Otherwise negative.  Physical Examination Filed Vitals:   09/29/11 1503  BP: 115/74  Pulse: 81   Normally nourished appearing woman, looking younger than stated age. HEENT: Conjunctiva and lids normal, oropharynx clear. Neck: Supple, no elevated JVP or  obvious carotid bruits, no thyromegaly. Lungs: Clear to auscultation, nonlabored breathing at rest. Cardiac: Regular rate and rhythm, no S3 or significant systolic murmur, no pericardial rub. Abdomen: Soft, nontender, bowel sounds present, no guarding or rebound. Extremities: No pitting edema, distal pulses 2+. Skin: Warm and dry. Musculoskeletal: Scoliosis. Neuropsychiatric: Alert and oriented x3, affect grossly appropriate.   ECG Normal sinus rhythm at 72.    Problem List and Plan   TIA (transient ischemic attack) Based on history, possibly at least 2 events over the last 6 months. No history of cardiac dysrhythmia, hypertension, or significant vascular disease. Her ECG is normal. No obvious  carotid bruits on examination, although they have not been assessed formally. We willl arrange a carotid Doppler study, hopefully for the same day that she sees Dr. Modesto Charon in [place] so that he could potentially have the results to go over with her. Otherwise we will inform her of the results.    Jonelle Sidle, M.D., F.A.C.C.

## 2011-10-02 ENCOUNTER — Telehealth: Payer: Self-pay | Admitting: Cardiology

## 2011-10-02 DIAGNOSIS — F3162 Bipolar disorder, current episode mixed, moderate: Secondary | ICD-10-CM | POA: Diagnosis not present

## 2011-10-02 NOTE — Telephone Encounter (Signed)
Patient called and has an emergency at home, unable to keep PV appointment today.  Notified Missy of this and advised patient we would call her back to reschedule appointment.  Please call patient and schedule her PV study.  Verify patient knows location to go to.

## 2011-10-05 ENCOUNTER — Encounter: Payer: Self-pay | Admitting: Neurology

## 2011-10-05 ENCOUNTER — Ambulatory Visit (INDEPENDENT_AMBULATORY_CARE_PROVIDER_SITE_OTHER): Payer: Medicare Other | Admitting: Neurology

## 2011-10-05 ENCOUNTER — Other Ambulatory Visit (INDEPENDENT_AMBULATORY_CARE_PROVIDER_SITE_OTHER): Payer: Medicare Other

## 2011-10-05 ENCOUNTER — Other Ambulatory Visit: Payer: Self-pay | Admitting: Neurology

## 2011-10-05 VITALS — BP 100/74 | HR 88 | Ht 61.0 in | Wt 131.0 lb

## 2011-10-05 DIAGNOSIS — G609 Hereditary and idiopathic neuropathy, unspecified: Secondary | ICD-10-CM

## 2011-10-05 DIAGNOSIS — R930 Abnormal findings on diagnostic imaging of skull and head, not elsewhere classified: Secondary | ICD-10-CM | POA: Diagnosis not present

## 2011-10-05 DIAGNOSIS — R7309 Other abnormal glucose: Secondary | ICD-10-CM

## 2011-10-05 DIAGNOSIS — R413 Other amnesia: Secondary | ICD-10-CM | POA: Diagnosis not present

## 2011-10-05 DIAGNOSIS — H53459 Other localized visual field defect, unspecified eye: Secondary | ICD-10-CM

## 2011-10-05 LAB — CBC WITH DIFFERENTIAL/PLATELET
Basophils Absolute: 0 10*3/uL (ref 0.0–0.1)
Eosinophils Absolute: 0.1 10*3/uL (ref 0.0–0.7)
HCT: 43.6 % (ref 36.0–46.0)
Hemoglobin: 14.6 g/dL (ref 12.0–15.0)
Lymphs Abs: 3.7 10*3/uL (ref 0.7–4.0)
MCHC: 33.5 g/dL (ref 30.0–36.0)
MCV: 87.3 fl (ref 78.0–100.0)
Monocytes Relative: 5.6 % (ref 3.0–12.0)
Neutro Abs: 3.5 10*3/uL (ref 1.4–7.7)
RDW: 13.8 % (ref 11.5–14.6)

## 2011-10-05 LAB — COMPREHENSIVE METABOLIC PANEL
ALT: 23 U/L (ref 0–35)
CO2: 26 mEq/L (ref 19–32)
Creatinine, Ser: 0.9 mg/dL (ref 0.4–1.2)
GFR: 68.9 mL/min (ref >60.00)
Total Bilirubin: 0.6 mg/dL (ref 0.3–1.2)

## 2011-10-05 LAB — SEDIMENTATION RATE: Sed Rate: 13 mm/hr (ref 0–22)

## 2011-10-05 NOTE — Patient Instructions (Addendum)
Go to the basement to have your labs drawn today.  Your MRI and MRa's are scheduled for tomorrow, July 30th at 3:00pm.  Please arrive to [facility], first floor admitting by 2:45pm.  [phone].  Dr. Leonides Cave will call you directly to schedule your appointment for memory loss.  [address] [phone].  We will call you with your appointment for the nerve conduction studies/electromyelogram at [facility] [address] [place], [place] [phone].  We will also call you with your appoitment to see Dr. Dione Booze, the eye doctor at [address] [place]. [phone].

## 2011-10-05 NOTE — Progress Notes (Signed)
Dear Dr. Marchelle Gearing,  Thank you for having me see Jenna Contreras in consultation today at [facility] for her problem with transient neurologic symptoms and progressive memory loss.  As you may recall, she is a 66 y.o. year old female with a history of bipolar disorder, migraine headaches, bipolar disorder, scoliosis and lumbar vertebral compression fractures who has had three spells of transient neurologic symptoms over the last 10 months.  The first began in September about 4 days after getting out of hospital for a pneumonia.  She had been discharged on Levoquin, but did not take it more than a day after discharge for fear of side effects.  In any case, she first felt off balance, and had problems with remembering a friends name who came to visit.  The next day her sense of imbalance was better but she still had persistent memory difficulties(problems in particular with names).  She feels that these memory difficulties have plagued her since then.  She had another spell sometime later when she had an argument with her husband, and the next day felt her memory was much worse.  She was not off balance in that spell.  Finally, she had a spell where vision in both her eyes got "blurry" for at least 5 hours.  She was seen by an optometrist who said she had a "stroke".  She has not had any change in her medications during this time.  She denies focal deficits.  She does say that she has a long term problem with imbalance and falls.  She denies numbness in her legs, but says her balance is worse with her eyes closed.  She has a history of lumbar compression fractures.  She is scheduled for a repeat MRI L-spine this week.   With respect to her Lamictal 200 bid she has been on this dose "for years".  She uses clonazepam infrequently but does take Ativan 1mg  qhs.  Past Medical History  Diagnosis Date  . Scoliosis   . Migraines   . IBS (irritable bowel syndrome)   . Arthritis   .  Hypothyroidism   . Asthma   . Fibromyalgia   . Depression   . OCD (obsessive compulsive disorder)   - no history of confirmed ischemic strokes or seizures. - did have an MRI which just showed WMD in 2006.   Past Surgical History  Procedure Date  . Dilation and curettage of uterus x3  . Tubal ligation   . Arthroscopic left knee surgery   . Wrist surgery   . Mandible surgery     History   Social History  . Marital Status: Married    Spouse Name: N/A    Number of Children: N/A  . Years of Education: N/A   Occupational History  . Counselor and retired Engineer, civil (consulting)    Social History Main Topics  . Smoking status: Former Smoker -- 2.0 packs/day for 19 years    Types: Cigarettes    Quit date: 03/09/1980  . Smokeless tobacco: Never Used  . Alcohol Use: No  . Drug Use: No  . Sexually Active: None   Other Topics Concern  . None   Social History Narrative  . None    Family History  Problem Relation Age of Onset  . Heart disease Mother   . Diabetes Mother   . Colon cancer Father   . Heart disease Brother     Current Outpatient Prescriptions on File Prior to Visit  Medication Sig Dispense Refill  .  albuterol (PROVENTIL HFA;VENTOLIN HFA) 108 (90 BASE) MCG/ACT inhaler Inhale 2 puffs into the lungs every 6 (six) hours as needed. FOR WHEEZING      . clonazePAM (KLONOPIN) 1 MG tablet Take 1 mg by mouth as needed.      . fentaNYL (DURAGESIC - DOSED MCG/HR) 50 MCG/HR Place 1 patch onto the skin every 3 (three) days.      Marland Kitchen HYDROcodone-acetaminophen (NORCO) 5-325 MG per tablet Take 1-2 tablets by mouth every 6 (six) hours as needed. FOR PAIN      . lamoTRIgine (LAMICTAL) 200 MG tablet Take 200 mg by mouth 2 (two) times daily.      Marland Kitchen levothyroxine (SYNTHROID, LEVOTHROID) 88 MCG tablet Take 88 mcg by mouth daily.        Marland Kitchen LORazepam (ATIVAN) 0.5 MG tablet Take 1 mg by mouth at bedtime.       . rizatriptan (MAXALT) 10 MG tablet Take 10 mg by mouth as needed. May repeat in 2 hours if  needed for migraines      . Spacer/Aero-Holding Chambers (AEROCHAMBER Z-STAT PLUS CHAMBR) MISC As directed  1 each  0    Allergies  Allergen Reactions  . Betadine (Povidone Iodine)   . Levaquin (Levofloxacin In D5w)     Painful joints   . Cephalexin Itching and Rash  . Doxycycline Itching and Rash  . Penicillins Itching and Rash  . Tetracyclines & Related Itching and Rash      ROS:  13 systems were reviewed and are notable for chronic back pain.  All other review of systems are unremarkable.   Examination:  Filed Vitals:   10/05/11 1104  BP: 100/74  Pulse: 88  Height: 5\' 1"  (1.549 m)  Weight: 131 lb (59.421 kg)     In general, well appearing women.  Cardiovascular: The patient has a regular rate and rhythm and no carotid bruits.  Fundoscopy:  Disks are flat. Vessel caliber within normal limits.  Mental status:   The patient is oriented to person, place and time. Recent and remote memory are intact. Attention span and concentration are normal. Language including repetition, naming, following commands are intact. Fund of knowledge of current and historical events, as well as vocabulary are normal.  Cranial Nerves: Pupils are equally round and reactive to light. VF in right eye, ?inferior temporal quadrantanopsia, left eye superior quadrantanopsia,  Extraocular movements are intact without nystagmus. Facial sensation and muscles of mastication are intact. Muscles of facial expression are symmetric. Hearing intact to bilateral finger rub. Tongue protrusion, uvula, palate midline.  Shoulder shrug intact  Motor:  The patient has normal bulk and tone, no pronator drift.  She does have some mild slowing of FFM on the right  5/5 muscle strength bilaterally.  Reflexes:   Biceps  Triceps Brachioradialis Knee Ankle  Right 2+  2+  2+   2+ 2+  Left  2+  2+  2+   2+ 2+  Toes down  Coordination:  Normal finger to nose.  No dysdiadokinesia.  Sensation is decreased to temp on  her left face and left arm, length dep loss of temp in legs, with right worse than left vibratory loss and position sense loss bilaterally in toes.  Gait and Station appear tentative, slightly unsteady.  Romberg is positive.   Impression/Recs: 1.  Spells of neurologic dysfunction - I have low suspicion for the first two spells being a TIA, and the 3rd with vision loss seems to long to be a TIA.  Certainly, medication toxicity could cause the symptoms of the first two spells and perhaps the 3rd.  Her Lamictal dose is quite high and the fact that she takes clonazepam, fentanyl, hydrocodone, Ativan as well put her at risk of this.  I am going to get an MRI brain and MRA of her head and neck.  I don't have a great sense of her memory problems.  I really don't think they are progressive and am not sure how they relate to the spells.  I may consider also checking a LTG level in the future. 2.  Memory Loss - NP testing.  It will be interesting to see Dr. Maxwell Marion input into whether medication may be slowing her cognition. 3.  Back pain/poor balance - I am curious to see the results of her MRI of her L-spine.  She has a lack of sensation in her legs, suggesting a PN or radiculopathy.  I am going to get PN labs and bilateral LE EMG/NCS. 4.  Visual field problems - unusual pattern of loss, it is possible from a chiasmal lesion.  I have referred her to Dr. Dione Booze for formal VF testing and to see if she has intrinsic eye disease related to this spell of blurriness.    We will see the patient back in 6 weeks.  Thank you for having [place] see Jenna Contreras in consultation.  Feel free to contact me with any questions.  Lupita Raider Modesto Charon, MD [facility], [facility] [address] [place], [place] [address] Phone: [phone] Fax: [phone].

## 2011-10-06 ENCOUNTER — Ambulatory Visit (HOSPITAL_COMMUNITY)
Admission: RE | Admit: 2011-10-06 | Discharge: 2011-10-06 | Disposition: A | Discharge status: Home / Self Care | Payer: Medicare Other | Source: Ambulatory Visit | Attending: Neurology | Admitting: Neurology

## 2011-10-06 ENCOUNTER — Ambulatory Visit (HOSPITAL_COMMUNITY): Admission: RE | Admit: 2011-10-06 | Payer: Medicare Other | Source: Ambulatory Visit

## 2011-10-06 DIAGNOSIS — G459 Transient cerebral ischemic attack, unspecified: Secondary | ICD-10-CM | POA: Diagnosis not present

## 2011-10-06 DIAGNOSIS — I6789 Other cerebrovascular disease: Secondary | ICD-10-CM | POA: Diagnosis not present

## 2011-10-06 DIAGNOSIS — R413 Other amnesia: Secondary | ICD-10-CM

## 2011-10-06 DIAGNOSIS — R930 Abnormal findings on diagnostic imaging of skull and head, not elsewhere classified: Secondary | ICD-10-CM | POA: Insufficient documentation

## 2011-10-06 DIAGNOSIS — G930 Cerebral cysts: Secondary | ICD-10-CM | POA: Diagnosis not present

## 2011-10-06 MED ORDER — GADOBENATE DIMEGLUMINE 529 MG/ML IV SOLN
12.0000 mL | Freq: Once | INTRAVENOUS | Status: AC
  Administered 2011-10-06: 12 mL via INTRAVENOUS

## 2011-10-07 LAB — METHYLMALONIC ACID, SERUM: Methylmalonic Acid, Quant: 0.09 umol/L (ref <0.40)

## 2011-10-07 LAB — SPEP & IFE WITH QIG
Alpha-1-Globulin: 5.5 % — ABNORMAL HIGH (ref 2.9–4.9)
Beta 2: 5 % (ref 3.2–6.5)
Gamma Globulin: 14.4 % (ref 11.1–18.8)
IgA: 155 mg/dL (ref 69–380)
IgM, Serum: 64 mg/dL (ref 52–322)

## 2011-10-12 ENCOUNTER — Telehealth: Payer: Self-pay | Admitting: Neurology

## 2011-10-12 NOTE — Telephone Encounter (Signed)
Spoke with Jenna Contreras. Informed of normal imaging results and the abnormal TSH. She will f/u with her PCP regarding that. Aware of her next appointment on 09/09 and of our new location as of 10/14/11.

## 2011-10-12 NOTE — Telephone Encounter (Signed)
Message copied by Benay Spice on Mon Oct 12, 2011 11:10 AM ------      Message from: Milas Gain      Created: Mon Oct 12, 2011  5:23 AM       Let Ms. Jenna Contreras know that her TSH was low, indicating that her thyroid replacement may be too much, she should talk to her family doctor about this.

## 2011-10-12 NOTE — Telephone Encounter (Signed)
Spoke with Jenna Contreras. Informed of normal imaging results and her abnormal TSH. She will f/u with her PCP regarding that. Aware of her f/u appointment with Dr. Modesto Charon in September and was informed of our new location as of 10/14/11.

## 2011-10-12 NOTE — Telephone Encounter (Signed)
Message copied by Benay Spice on Mon Oct 12, 2011 11:07 AM ------      Message from: Milas Gain      Created: Mon Oct 12, 2011  5:27 AM       Also let her know that her MRI/MRA looked good.

## 2011-10-13 DIAGNOSIS — H43399 Other vitreous opacities, unspecified eye: Secondary | ICD-10-CM | POA: Diagnosis not present

## 2011-10-13 DIAGNOSIS — H2510 Age-related nuclear cataract, unspecified eye: Secondary | ICD-10-CM | POA: Diagnosis not present

## 2011-10-13 DIAGNOSIS — H5310 Unspecified subjective visual disturbances: Secondary | ICD-10-CM | POA: Diagnosis not present

## 2011-10-13 DIAGNOSIS — H43819 Vitreous degeneration, unspecified eye: Secondary | ICD-10-CM | POA: Diagnosis not present

## 2011-10-21 ENCOUNTER — Telehealth: Payer: Self-pay | Admitting: Neurology

## 2011-10-21 NOTE — Telephone Encounter (Signed)
Pt called to check on referral to [facility] for an EMG/NCS. No one has called her with an appointment yet. 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 She isn't sure if this has bearing on her testing or not.

## 2011-10-23 DIAGNOSIS — M47817 Spondylosis without myelopathy or radiculopathy, lumbosacral region: Secondary | ICD-10-CM | POA: Diagnosis not present

## 2011-10-23 DIAGNOSIS — M4120 Other idiopathic scoliosis, site unspecified: Secondary | ICD-10-CM | POA: Diagnosis not present

## 2011-10-26 NOTE — Telephone Encounter (Signed)
Left appt. info on pt's voicemail.

## 2011-10-29 ENCOUNTER — Telehealth: Payer: Self-pay | Admitting: Neurology

## 2011-10-29 NOTE — Telephone Encounter (Signed)
Pt forgot to mention when she was here last time that she has jerking in her arms and legs.  It happens everyday and has been going on for the last four years.  Sometimes she has hit herself in the mouth or her legs will come completely off the bed.   She was not happy with Dr. Leonides Cave and staff, he won't see her until October which is of course after you are gone.  Do you want to try and get her in at [facility]?  She does not mind going to [facility].

## 2011-10-29 NOTE — Telephone Encounter (Signed)
Pt requested that you call her regarding the "appointment you scheduled" for her.

## 2011-10-29 NOTE — Telephone Encounter (Signed)
as discussed [facility].

## 2011-10-30 ENCOUNTER — Encounter: Payer: Self-pay | Admitting: Cardiology

## 2011-10-30 DIAGNOSIS — F3162 Bipolar disorder, current episode mixed, moderate: Secondary | ICD-10-CM | POA: Diagnosis not present

## 2011-11-03 NOTE — Telephone Encounter (Signed)
Appt at [facility] 11/11 and NCS is 8/30 at 10:00am.  Pt aware of both appts.

## 2011-11-06 DIAGNOSIS — M5137 Other intervertebral disc degeneration, lumbosacral region: Secondary | ICD-10-CM | POA: Diagnosis not present

## 2011-11-12 DIAGNOSIS — M549 Dorsalgia, unspecified: Secondary | ICD-10-CM | POA: Diagnosis not present

## 2011-11-12 DIAGNOSIS — K810 Acute cholecystitis: Secondary | ICD-10-CM | POA: Diagnosis not present

## 2011-11-13 ENCOUNTER — Telehealth: Payer: Self-pay | Admitting: *Deleted

## 2011-11-13 DIAGNOSIS — F3162 Bipolar disorder, current episode mixed, moderate: Secondary | ICD-10-CM | POA: Diagnosis not present

## 2011-11-13 NOTE — Telephone Encounter (Signed)
Spoke with patient in r/e to cancelled carotid test. Patient states she is ready to reschedule this now. Information forwarded to [facility].

## 2011-11-16 ENCOUNTER — Ambulatory Visit (INDEPENDENT_AMBULATORY_CARE_PROVIDER_SITE_OTHER): Payer: Medicare Other | Admitting: Neurology

## 2011-11-16 ENCOUNTER — Encounter: Payer: Self-pay | Admitting: Neurology

## 2011-11-16 VITALS — BP 100/58 | HR 58 | Wt 131.0 lb

## 2011-11-16 DIAGNOSIS — G4710 Hypersomnia, unspecified: Secondary | ICD-10-CM | POA: Diagnosis not present
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
## 2011-11-16 NOTE — Patient Instructions (Addendum)
Your sleep consultation appointment is scheduled for this Wednesday, Sept. 11 at 10:30am with Dr. Shelle Iron at [facility] [address] 2nd floor.  [phone]

## 2011-11-17 ENCOUNTER — Other Ambulatory Visit: Payer: Self-pay | Admitting: Family Medicine

## 2011-11-17 DIAGNOSIS — G8929 Other chronic pain: Secondary | ICD-10-CM | POA: Diagnosis not present

## 2011-11-17 DIAGNOSIS — Z13820 Encounter for screening for osteoporosis: Secondary | ICD-10-CM

## 2011-11-17 DIAGNOSIS — M545 Low back pain: Secondary | ICD-10-CM | POA: Diagnosis not present

## 2011-11-18 ENCOUNTER — Ambulatory Visit (INDEPENDENT_AMBULATORY_CARE_PROVIDER_SITE_OTHER): Payer: Medicare Other | Admitting: Pulmonary Disease

## 2011-11-18 ENCOUNTER — Telehealth: Payer: Self-pay

## 2011-11-18 ENCOUNTER — Encounter: Payer: Self-pay | Admitting: Pulmonary Disease

## 2011-11-18 VITALS — BP 120/72 | HR 65 | Temp 98.2°F | Ht 60.0 in | Wt 133.0 lb

## 2011-11-18 DIAGNOSIS — Z23 Encounter for immunization: Secondary | ICD-10-CM

## 2011-11-18 DIAGNOSIS — G4710 Hypersomnia, unspecified: Secondary | ICD-10-CM | POA: Diagnosis not present
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
## 2011-11-18 NOTE — Patient Instructions (Addendum)
Will have you fill out sleep logs for the next 2 weeks.  Please bring to your sleep study. Will schedule for nocturnal sleep study, and will followup with nap study if it does not explain your sleepiness. Please avoid napping as much as possible during day and early evening leading up to sleep study.  Get up and do something active if you find yourself getting sleepy.   Will arrange followup once results are available.

## 2011-11-18 NOTE — Telephone Encounter (Signed)
Called patient's home and left message asking her to return my call. Need to re-schedule dopplers.

## 2011-11-18 NOTE — Assessment & Plan Note (Signed)
The patient has very profound sleepiness by her history, but it is unclear if this is secondary to a sleep disorder.  She has no history to suggest sleep disordered breathing or a movement disorder of sleep, and the only thing to suggest narcolepsy is her sleepiness that dates back to young adulthood.  She obviously has very poor sleep hygiene, and no set sleep schedule.  She is also taking multiple medications that can alter her alertness level, and it is unknown how much pain medication she could be taking.  She also has a history of bipolar disease, and I wonder how much this is factoring in to the equation.  At this point, will proceed with a sleep evaluation consisting of nocturnal polysomnography, as well as multiple sleep latency testing.  I have given her sleep diaries to complete over the next 2 weeks.  I have also asked her to try and avoid sleeping during the day as much as possible leading up to her testing.

## 2011-11-23 ENCOUNTER — Ambulatory Visit (HOSPITAL_COMMUNITY)
Admission: RE | Admit: 2011-11-23 | Discharge: 2011-11-23 | Disposition: A | Discharge status: Home / Self Care | Payer: Medicare Other | Source: Ambulatory Visit | Attending: Family Medicine | Admitting: Family Medicine

## 2011-11-23 DIAGNOSIS — M949 Disorder of cartilage, unspecified: Secondary | ICD-10-CM | POA: Diagnosis not present

## 2011-11-23 DIAGNOSIS — Z780 Asymptomatic menopausal state: Secondary | ICD-10-CM | POA: Diagnosis not present

## 2011-11-23 DIAGNOSIS — M899 Disorder of bone, unspecified: Secondary | ICD-10-CM | POA: Insufficient documentation

## 2011-11-23 DIAGNOSIS — Z13820 Encounter for screening for osteoporosis: Secondary | ICD-10-CM | POA: Insufficient documentation

## 2011-12-02 ENCOUNTER — Encounter (INDEPENDENT_AMBULATORY_CARE_PROVIDER_SITE_OTHER): Payer: Medicare Other

## 2011-12-02 DIAGNOSIS — G459 Transient cerebral ischemic attack, unspecified: Secondary | ICD-10-CM | POA: Diagnosis not present

## 2011-12-02 DIAGNOSIS — Z136 Encounter for screening for cardiovascular disorders: Secondary | ICD-10-CM

## 2011-12-03 ENCOUNTER — Encounter: Payer: Self-pay | Admitting: Internal Medicine

## 2011-12-03 ENCOUNTER — Ambulatory Visit (INDEPENDENT_AMBULATORY_CARE_PROVIDER_SITE_OTHER): Payer: Medicare Other | Admitting: Internal Medicine

## 2011-12-03 VITALS — BP 108/72 | HR 81 | Temp 97.7°F | Ht 61.0 in | Wt 132.6 lb

## 2011-12-03 DIAGNOSIS — J4530 Mild persistent asthma, uncomplicated: Secondary | ICD-10-CM

## 2011-12-03 DIAGNOSIS — J45909 Unspecified asthma, uncomplicated: Secondary | ICD-10-CM | POA: Diagnosis not present

## 2011-12-03 NOTE — Progress Notes (Signed)
Subjective:    Patient ID: Jenna Contreras, female    DOB: 11/18/1945, 66 y.o.   MRN: [id number]  HPI IOV Nov 393:  66 year old female accompanied with husband. States was in hospital ([facility]) with "pneumonia" around early September 2012. Spent 1 week in hospital. At discharge she was informed that the pneumonia had cleared and she no longer needed bronch but needed followup 5 weeks. States she was given a week of levoflox but now realizes that she should not have been given that due to risk of ligament injury. Around 11/25/10 visited [place] (at this time unhappy with [facility]) due to persistence of dry cough and diffuse bodyache (concerned side effect of levoflox). CXR was clear (personally reviewed). Had CBC, bmet, ck, esr - all normal. Ionized calcium was slightly normal.   Currently being bothered by dyspnea and chronic cough and also states that she forgot to mention she has birds. Reports dyspnea since admission in sept 2012. Feels likely getting worse. Rates it as moderate-severe. Even trying to talk or exertion like getting out of bed makes her dyspneic. Dyspnea improved by nebs. Cough also present since pneumonia. Was productive of brown sputum but now dry. Rates cough as moderate- severe. Coughs during sleep and when changing posture from supine to sitting. There is associated wheezing present.  Denies hemoptysis,  Both preventing her from cleaning birds room (see below)  PMHx 1. Asthma  - diagnosed mid 34s. Unclear how dx was made but she reckons it was based on PEF. Describes cough variant asthma. Hx of "respiratory arrest" in 1998 + but says she refused to go to ER and get Rx. Uses albuterol prn for asthma "attacks". Typicall gets attacks 1-4 times per week upon exposure to birds at home  2. Scoliosis  3. Admitted for depression and OCD in 2007  4. Irritable bowel syndrome.  2. Arthritis.  3. Scoliosis.  4. Migraines.  5. Hypothyroidism.  6.  Fibromyalgia.  Social 1. Birds - parrots with lot of feather dust in one room. Since 2000. Large Macaw and a cocakteel and a quaker. Total 3 birds. Gets exposed atleast 1-2 per week 2. Works as Engineer, site and domestic Corporate treasurer.  3. Husband and she work together in Personnel officer. In [place], [place]. Married x 27 years 4. Ex-smoker. Smoked cigs 15-20  Years x 1 ppd. Quit 35 years ago 5. Denies MJ, coccaine.  6. Severeal grandkids and 1 great grand kid    Nurse wil walk you for oxygen levels  Have full PFT breathing test - once test done, give [place] a call so I can review you aand advise over phone next step  Telephone call Nov 2012 Updated patient on CT results NOv 2012 -   #nomral lung parenchyma. Still with cough and dyspnea.  DDx of post pna cough taking time to resolve explained but she prefers workup now. She does not think that is the case  #small lung nodule < 4mm  Plan as agreed with patient  - get records from Dr Sidney Ace  Esp spirometry, noptes, and allergy testing - [name] please organize this and leave in my look at  - if above are unhelpful, will get Methacholine chalenge and if that is negative will get CPST  - neeeds repeat CT chest wtihout contrast in 1  year     OV 08/05/2011  In terms of dyspnea and cough - lot better, almost gone. Has not had any asthma attacks  since sept 2012. Not on any maintenance mdi. Learning today not seen Dr Laneta Simmers since 1996; so no records on details of the asthma diagnosis.Marland Kitchen However, she does report wheezing oonce daily with exposure to her birds. No associaed symptoms. No relieving factors   Past, Family, Social reviewed: reported TIA episodes 2 time since last visit. Says PMD refused to get CT brain and therefore wants me to address this isuseu. Most recent TIA 4 weeks ago was very upset with husband. Feels bp rose and next day could not remember peoples names and had word amnesia.  Still struggling with remembering. No focal weakness. Just memory issues.  #Memory issues  - will refer to neurology  #Wheezing  - have methacholine challenge test  #Followup  - will call with result  OV 09/03/2011 Here to discuss methacholine challenge results of 08/12/11:  - 25% reducction in fvc and 28% reductiion in fev1 at dose - and c/w asthma/ Her interimittent wheeze continues and is of moderate severity. We discussed her birds: clearly she is very emotionally involved about her birds because she has had them > 10 years. There  Is lot of bird dust exposure but she says they talk to her and are emotionally dependent on her.  Otherwise no new resp issues  Past, Family, Social reviewed: no change since last visit but intermittent "tia" symptoms of blurred vision continue. Neuro appt is only 10/03/11 per hx. She interested in speeding this up.  #ASthma  - methacholine challenge test consistent with asthma  - start qvar 2 puff twice daily with spacer; nurse will give sample and teach technique and give script  - understand that cutting down bird exposure is difficult; we will closely monitor your lung health  #Followup  - 3 months or sooner for asthma followup  - our coordinator will see if neurologist can see you sooner than July 27th, 2013   OV 12/03/2011 Followup mild persistent asthma.   In interim: Took qvar for a while and then stopped because she was feeling well though she knew that qvar is maintenance. She says no adversed events since coming off qvar. Asymptomatic. No albuterol rescue use. No nocturnal awakening. No wheeze. No dyspnea. No cough. She has had her flu shot. She wants to monitor herself off qvar  Past, Family, Social reviewed: seen by Dr Modesto Charon of neuro per her hx: no TIA. One bird died. Another bird has cancer and expected to die in weeks to months  Review of Systems  Constitutional: Negative for fever and unexpected weight change.  HENT: Negative for  ear pain, nosebleeds, congestion, sore throat, rhinorrhea, sneezing, trouble swallowing, dental problem, postnasal drip and sinus pressure.   Eyes: Negative for redness and itching.  Respiratory: Negative for cough, chest tightness, shortness of breath and wheezing.   Cardiovascular: Negative for palpitations and leg swelling.  Gastrointestinal: Negative for nausea and vomiting.  Genitourinary: Negative for dysuria.  Musculoskeletal: Negative for joint swelling.  Skin: Negative for rash.  Neurological: Negative for headaches.  Hematological: Does not bruise/bleed easily.  Psychiatric/Behavioral: Negative for dysphoric mood. The patient is not nervous/anxious.        Objective:   Physical Exam Vitals reviewed. Constitutional: She is oriented to person, place, and time. She appears well-developed and well-nourished. No distress.  HENT:  Head: Normocephalic and atraumatic.  Right Ear: External ear normal.  Left Ear: External ear normal.  Mouth/Throat: Oropharynx is clear and moist. No oropharyngeal exudate.  Eyes: Conjunctivae and EOM are normal.  Pupils are equal, round, and reactive to light. Right eye exhibits no discharge. Left eye exhibits no discharge. No scleral icterus.  Neck: Normal range of motion. Neck supple. No JVD present. No tracheal deviation present. No thyromegaly present.  Cardiovascular: Normal rate, regular rhythm, normal heart sounds and intact distal pulses.  Exam reveals no gallop and no friction rub.   No murmur heard. Pulmonary/Chest: Effort normal. No respiratory distress. She has no wheezes.  She exhibits no tenderness.       Abdominal: Soft. Bowel sounds are normal. She exhibits no distension and no mass. There is no tenderness. There is no rebound and no guarding.  Musculoskeletal: Normal range of motion. She exhibits no edema and no tenderness.  Lymphadenopathy:    She has no cervical adenopathy.  Neurological: She is alert and oriented to person, place,  and time. She has normal reflexes. No cranial nerve deficit. She exhibits normal muscle tone. Coordination normal.  Skin: Skin is warm and dry. No rash noted. She is not diaphoretic. No erythema. No pallor.  Psychiatric: She has a normal mood and affect. Her behavior is normal. Judgment and thought content normal.         Assessment & Plan:

## 2011-12-03 NOTE — Assessment & Plan Note (Signed)
Glad you are feeling better and asymptomatic Glad you had flu shot As discussed we agreed that we will watch you off QVAR (oer your wishes) Return in 9 months or sooner if needed

## 2011-12-03 NOTE — Patient Instructions (Addendum)
Glad you are feeling better and asymptomatic Glad you had flu shot As discussed we agreed that we will watch you off QVAR Return in 9 months or sooner if needed

## 2011-12-07 DIAGNOSIS — F3162 Bipolar disorder, current episode mixed, moderate: Secondary | ICD-10-CM | POA: Diagnosis not present

## 2011-12-10 ENCOUNTER — Encounter: Payer: Self-pay | Admitting: *Deleted

## 2011-12-13 ENCOUNTER — Ambulatory Visit (HOSPITAL_BASED_OUTPATIENT_CLINIC_OR_DEPARTMENT_OTHER): Payer: Medicare Other

## 2011-12-14 ENCOUNTER — Encounter (HOSPITAL_BASED_OUTPATIENT_CLINIC_OR_DEPARTMENT_OTHER): Payer: Medicare Other

## 2011-12-23 ENCOUNTER — Other Ambulatory Visit: Payer: Self-pay | Admitting: Family Medicine

## 2011-12-23 ENCOUNTER — Ambulatory Visit (HOSPITAL_COMMUNITY)
Admission: RE | Admit: 2011-12-23 | Discharge: 2011-12-23 | Disposition: A | Discharge status: Home / Self Care | Payer: Medicare Other | Source: Ambulatory Visit | Attending: Family Medicine | Admitting: Family Medicine

## 2011-12-23 DIAGNOSIS — M542 Cervicalgia: Secondary | ICD-10-CM | POA: Insufficient documentation

## 2011-12-23 DIAGNOSIS — M5030 Other cervical disc degeneration, unspecified cervical region: Secondary | ICD-10-CM | POA: Diagnosis not present

## 2011-12-23 DIAGNOSIS — R209 Unspecified disturbances of skin sensation: Secondary | ICD-10-CM | POA: Insufficient documentation

## 2011-12-23 DIAGNOSIS — M4120 Other idiopathic scoliosis, site unspecified: Secondary | ICD-10-CM | POA: Diagnosis not present

## 2011-12-23 DIAGNOSIS — R200 Anesthesia of skin: Secondary | ICD-10-CM

## 2011-12-23 DIAGNOSIS — M47812 Spondylosis without myelopathy or radiculopathy, cervical region: Secondary | ICD-10-CM | POA: Diagnosis not present

## 2011-12-28 DIAGNOSIS — M4120 Other idiopathic scoliosis, site unspecified: Secondary | ICD-10-CM | POA: Diagnosis not present

## 2011-12-28 DIAGNOSIS — M129 Arthropathy, unspecified: Secondary | ICD-10-CM | POA: Diagnosis not present

## 2011-12-31 DIAGNOSIS — M169 Osteoarthritis of hip, unspecified: Secondary | ICD-10-CM | POA: Diagnosis not present

## 2011-12-31 DIAGNOSIS — M5137 Other intervertebral disc degeneration, lumbosacral region: Secondary | ICD-10-CM | POA: Diagnosis not present

## 2011-12-31 DIAGNOSIS — M4120 Other idiopathic scoliosis, site unspecified: Secondary | ICD-10-CM | POA: Diagnosis not present

## 2012-01-04 DIAGNOSIS — F3162 Bipolar disorder, current episode mixed, moderate: Secondary | ICD-10-CM | POA: Diagnosis not present

## 2012-01-25 DIAGNOSIS — F3162 Bipolar disorder, current episode mixed, moderate: Secondary | ICD-10-CM | POA: Diagnosis not present

## 2012-01-28 DIAGNOSIS — E119 Type 2 diabetes mellitus without complications: Secondary | ICD-10-CM | POA: Diagnosis not present

## 2012-01-28 DIAGNOSIS — Z23 Encounter for immunization: Secondary | ICD-10-CM | POA: Diagnosis not present

## 2012-02-10 DIAGNOSIS — R413 Other amnesia: Secondary | ICD-10-CM | POA: Diagnosis not present

## 2012-02-15 DIAGNOSIS — F3162 Bipolar disorder, current episode mixed, moderate: Secondary | ICD-10-CM | POA: Diagnosis not present

## 2012-02-25 DIAGNOSIS — R413 Other amnesia: Secondary | ICD-10-CM | POA: Diagnosis not present

## 2012-02-25 DIAGNOSIS — J019 Acute sinusitis, unspecified: Secondary | ICD-10-CM | POA: Diagnosis not present

## 2012-03-10 DIAGNOSIS — F3162 Bipolar disorder, current episode mixed, moderate: Secondary | ICD-10-CM | POA: Diagnosis not present

## 2012-03-21 ENCOUNTER — Encounter (HOSPITAL_BASED_OUTPATIENT_CLINIC_OR_DEPARTMENT_OTHER): Payer: Medicare Other

## 2012-03-22 ENCOUNTER — Encounter (HOSPITAL_BASED_OUTPATIENT_CLINIC_OR_DEPARTMENT_OTHER): Payer: Medicare Other

## 2012-04-12 ENCOUNTER — Emergency Department (HOSPITAL_COMMUNITY)
Admission: EM | Admit: 2012-04-12 | Discharge: 2012-04-12 | Disposition: A | Discharge status: Home / Self Care | Payer: Medicare Other | Attending: Emergency Medicine | Admitting: Emergency Medicine

## 2012-04-12 ENCOUNTER — Encounter (HOSPITAL_COMMUNITY): Payer: Self-pay | Admitting: *Deleted

## 2012-04-12 ENCOUNTER — Emergency Department (HOSPITAL_COMMUNITY): Payer: Medicare Other

## 2012-04-12 ENCOUNTER — Telehealth: Payer: Self-pay | Admitting: Internal Medicine

## 2012-04-12 DIAGNOSIS — J45909 Unspecified asthma, uncomplicated: Secondary | ICD-10-CM | POA: Diagnosis not present

## 2012-04-12 DIAGNOSIS — F329 Major depressive disorder, single episode, unspecified: Secondary | ICD-10-CM | POA: Insufficient documentation

## 2012-04-12 DIAGNOSIS — Z8701 Personal history of pneumonia (recurrent): Secondary | ICD-10-CM | POA: Insufficient documentation

## 2012-04-12 DIAGNOSIS — Z8679 Personal history of other diseases of the circulatory system: Secondary | ICD-10-CM | POA: Insufficient documentation

## 2012-04-12 DIAGNOSIS — F429 Obsessive-compulsive disorder, unspecified: Secondary | ICD-10-CM | POA: Insufficient documentation

## 2012-04-12 DIAGNOSIS — Z8709 Personal history of other diseases of the respiratory system: Secondary | ICD-10-CM | POA: Insufficient documentation

## 2012-04-12 DIAGNOSIS — Z79899 Other long term (current) drug therapy: Secondary | ICD-10-CM | POA: Insufficient documentation

## 2012-04-12 DIAGNOSIS — G43909 Migraine, unspecified, not intractable, without status migrainosus: Secondary | ICD-10-CM | POA: Insufficient documentation

## 2012-04-12 DIAGNOSIS — F3289 Other specified depressive episodes: Secondary | ICD-10-CM | POA: Insufficient documentation

## 2012-04-12 DIAGNOSIS — R0602 Shortness of breath: Secondary | ICD-10-CM | POA: Diagnosis not present

## 2012-04-12 DIAGNOSIS — R0789 Other chest pain: Secondary | ICD-10-CM | POA: Insufficient documentation

## 2012-04-12 DIAGNOSIS — Z8739 Personal history of other diseases of the musculoskeletal system and connective tissue: Secondary | ICD-10-CM | POA: Diagnosis not present

## 2012-04-12 DIAGNOSIS — E039 Hypothyroidism, unspecified: Secondary | ICD-10-CM | POA: Diagnosis not present

## 2012-04-12 DIAGNOSIS — R079 Chest pain, unspecified: Secondary | ICD-10-CM | POA: Diagnosis not present

## 2012-04-12 DIAGNOSIS — Z8719 Personal history of other diseases of the digestive system: Secondary | ICD-10-CM | POA: Diagnosis not present

## 2012-04-12 DIAGNOSIS — Z87891 Personal history of nicotine dependence: Secondary | ICD-10-CM | POA: Insufficient documentation

## 2012-04-12 LAB — CBC WITH DIFFERENTIAL/PLATELET
Basophils Absolute: 0.1 10*3/uL (ref 0.0–0.1)
Basophils Relative: 1 % (ref 0–1)
Eosinophils Absolute: 0.1 10*3/uL (ref 0.0–0.7)
Eosinophils Relative: 2 % (ref 0–5)
HCT: 43.8 % (ref 36.0–46.0)
MCHC: 34 g/dL (ref 30.0–36.0)
Monocytes Absolute: 0.5 10*3/uL (ref 0.1–1.0)
Neutro Abs: 3.9 10*3/uL (ref 1.7–7.7)
RDW: 13.4 % (ref 11.5–15.5)

## 2012-04-12 LAB — COMPREHENSIVE METABOLIC PANEL
AST: 20 U/L (ref 0–37)
Albumin: 4 g/dL (ref 3.5–5.2)
Calcium: 9.7 mg/dL (ref 8.4–10.5)
Chloride: 101 mEq/L (ref 96–112)
Creatinine, Ser: 0.69 mg/dL (ref 0.50–1.10)
Total Protein: 7.4 g/dL (ref 6.0–8.3)

## 2012-04-12 LAB — TROPONIN I: Troponin I: <0.3 ng/mL (ref <0.30)

## 2012-04-12 LAB — PRO B NATRIURETIC PEPTIDE: Pro B Natriuretic peptide (BNP): 42.9 pg/mL (ref 0–125)

## 2012-04-12 MED ORDER — ALBUTEROL SULFATE (5 MG/ML) 0.5% IN NEBU
INHALATION_SOLUTION | RESPIRATORY_TRACT | Status: AC
  Filled 2012-04-12: qty 1

## 2012-04-12 MED ORDER — ALBUTEROL SULFATE HFA 108 (90 BASE) MCG/ACT IN AERS: 1.0000 | INHALATION_SPRAY | Freq: Four times a day (QID) | RESPIRATORY_TRACT | Status: DC | PRN

## 2012-04-12 MED ORDER — ALBUTEROL SULFATE (5 MG/ML) 0.5% IN NEBU
2.5000 mg | INHALATION_SOLUTION | Freq: Once | RESPIRATORY_TRACT | Status: AC
  Administered 2012-04-12: 2.5 mg via RESPIRATORY_TRACT

## 2012-04-12 NOTE — ED Provider Notes (Signed)
History  This chart was scribed for Loren Racer, MD by Shari Heritage, ED Scribe. The patient was seen in room WA19/WA19. Patient's care was started at 1846.   CSN: [id number]  Arrival date & time 04/12/12  1738   First MD Initiated Contact with Patient 04/12/12 1846      Chief Complaint  Patient presents with  . Chest Pain     Patient is a 67 y.o. female presenting with chest pain. The history is provided by the patient. No language interpreter was used.  Chest Pain The chest pain began 3 - 5 hours ago. Duration of episode(s) is 3 hours. Chest pain occurs constantly. The chest pain is improving. The pain is currently at 3/10. The severity of the pain is mild. The quality of the pain is described as dull. The pain does not radiate. Primary symptoms include shortness of breath. Pertinent negatives for primary symptoms include no fever and no cough.  The shortness of breath began today. The shortness of breath developed suddenly. The shortness of breath is mild. The patient's medical history is significant for asthma. She tried nothing for the symptoms.     HPI Comments: Jenna Contreras is a 67 y.o. female with history of asthma who presents to the Emergency Department complaining of improving, mild to moderate, non-radiating, dull central chest pain and mild to moderate, shortness of breath that began at 4 PM today. She says that pain is worse with deep inspiration. She states that pain was moderate in severity for 2 hours, but is now improving. Current pain is 3/10. She denies lower extremity swelling, wheezing, cough, fevers or chills. She denies any recent extended travel. She hasn't had any recent surgeries. Patient self-reports a history of lung damage and pneumonia and states that she always has mild baseline SOB. Patient says that she had CT scan several months ago that showed a nodule on lungs. She says that she has not had another follow up due to unavailability of her  pulmonologist.  Other medical history includes depression and OCD.    Past Medical History  Diagnosis Date  . Scoliosis   . Migraines   . IBS (irritable bowel syndrome)   . Arthritis   . Hypothyroidism   . Asthma   . Fibromyalgia   . Depression   . OCD (obsessive compulsive disorder)   . Arthritis     Past Surgical History  Procedure Date  . Dilation and curettage of uterus x3  . Tubal ligation   . Arthroscopic left knee surgery   . Wrist surgery   . Mandible surgery     Family History  Problem Relation Age of Onset  . Heart disease Mother   . Diabetes Mother   . Colon cancer Father   . Heart disease Brother     History  Substance Use Topics  . Smoking status: Former Smoker -- 2.0 packs/day for 19 years    Types: Cigarettes    Quit date: 03/09/1980  . Smokeless tobacco: Never Used  . Alcohol Use: No     Comment: no    OB History    Grav Para Term Preterm Abortions TAB SAB Ect Mult Living                  Review of Systems  Constitutional: Negative for fever and chills.  Respiratory: Positive for shortness of breath. Negative for cough.   Cardiovascular: Positive for chest pain. Negative for leg swelling.  All other systems reviewed and  are negative.    Allergies  Betadine; Latex; Levaquin; Cephalexin; Doxycycline; Penicillins; and Tetracyclines & related  Home Medications   Current Outpatient Rx  Name  Route  Sig  Dispense  Refill  . FENTANYL 75 MCG/HR TD PT72   Transdermal   Place 1 patch onto the skin every 3 (three) days.         Marland Kitchen HYDROCODONE-ACETAMINOPHEN 10-325 MG PO TABS   Oral   Take 2 tablets by mouth every 6 (six) hours as needed. pain         . LAMOTRIGINE 200 MG PO TABS   Oral   Take 200 mg by mouth at bedtime.          Marland Kitchen LEVOTHYROXINE SODIUM 88 MCG PO TABS   Oral   Take 88 mcg by mouth daily.           Marland Kitchen LITHIUM CARBONATE 150 MG PO CAPS   Oral   Take 150 mg by mouth every evening.          Marland Kitchen LORAZEPAM 1 MG PO  TABS   Oral   Take 1 mg by mouth at bedtime.         Marland Kitchen RIZATRIPTAN BENZOATE 10 MG PO TABS   Oral   Take 10 mg by mouth as needed. May repeat in 2 hours if needed for migraines         . ALBUTEROL SULFATE HFA 108 (90 BASE) MCG/ACT IN AERS   Inhalation   Inhale 1-2 puffs into the lungs every 6 (six) hours as needed for wheezing.   1 Inhaler   0     Triage Vitals: BP 118/81  Pulse 78  Temp 98.3 F (36.8 C) (Oral)  Resp 20  SpO2 99%  Physical Exam  Constitutional: She is oriented to person, place, and time. She appears well-developed and well-nourished. No distress.       Comfortable.  HENT:  Head: Normocephalic and atraumatic.  Eyes: Conjunctivae normal and EOM are normal. Pupils are equal, round, and reactive to light.  Neck: Normal range of motion. Neck supple.  Cardiovascular: Normal rate and regular rhythm.   Pulmonary/Chest: Effort normal and breath sounds normal. She exhibits no tenderness.  Musculoskeletal: Normal range of motion. She exhibits no edema and no tenderness.       No calf tenderness or swelling.   Neurological: She is alert and oriented to person, place, and time.  Skin: Skin is warm and dry.  Psychiatric: She has a normal mood and affect. Her behavior is normal.    ED Course  Procedures (including critical care time) DIAGNOSTIC STUDIES: Oxygen Saturation is 99% on room air, normal by my interpretation.    COORDINATION OF CARE: 7:04 PM- Heart is regular rate and rhythm. Lungs are clear. No chest tenderness or calf swelling. No risk factors for DVT or PE. Will work up to rule out. Patient informed of current plan for treatment and evaluation and agrees with plan at this time.   9:27 PM- Labs and imaging reassuring. Reading a book and is comfortable. Had an albuterol treatment which improved symptoms significantly. Will send patient home with inhaler.    Labs Reviewed  CBC WITH DIFFERENTIAL - Abnormal; Notable for the following:    Lymphocytes  Relative 47 (*)     Lymphs Abs 4.1 (*)     All other components within normal limits  COMPREHENSIVE METABOLIC PANEL - Abnormal; Notable for the following:    GFR calc non Af Denyse Dago  87 (*)     All other components within normal limits  D-DIMER, QUANTITATIVE  PRO B NATRIURETIC PEPTIDE  TROPONIN I     Dg Chest 2 View  04/12/2012  *RADIOLOGY REPORT*  Clinical Data: Shortness of breath and chest pain.  CHEST - 2 VIEW  Comparison: 11/25/2010  Findings: Cardiomediastinal silhouette is within normal limits. Lungs are free of focal consolidations and pleural effusions.  No edema. Visualized osseous structures have a normal appearance.  IMPRESSION: Negative exam.   Original Report Authenticated By: Norva Pavlov, M.D.      1. Atypical chest pain       MDM  I personally performed the services described in this documentation, which was scribed in my presence. The recorded information has been reviewed and is accurate.    Loren Racer, MD 04/12/12 2308

## 2012-04-12 NOTE — ED Notes (Addendum)
Pt reports breathing difficulty which started this afternoon at 1600.  Pt also reports pain in her chest with inspiration-which is now resolved.  Pt reports hx of asthma.  Pt reports SOB with exertion as well-denies noticing any swelling in her ankles.

## 2012-04-12 NOTE — Telephone Encounter (Signed)
Called spoke with patient who c/o: Severe pain across the bottom of both lungs Tightness in chest Increased SOB Had "loose cough" yesterday with beige mucus production x1 episode, but above symptoms became acute x1-1.5 hours ago. 8 on pain scale Pain is with regular breathing Feels bad enough to go to the ER Advised pt this is best for immediate evaluation Will sign and forward to MR as [facility]

## 2012-04-12 NOTE — Telephone Encounter (Signed)
Go to ER. AGree

## 2012-04-29 ENCOUNTER — Other Ambulatory Visit (HOSPITAL_COMMUNITY): Payer: Self-pay | Admitting: Family Medicine

## 2012-04-29 DIAGNOSIS — D1430 Benign neoplasm of unspecified bronchus and lung: Secondary | ICD-10-CM | POA: Diagnosis not present

## 2012-04-29 DIAGNOSIS — E039 Hypothyroidism, unspecified: Secondary | ICD-10-CM | POA: Diagnosis not present

## 2012-04-29 DIAGNOSIS — R911 Solitary pulmonary nodule: Secondary | ICD-10-CM

## 2012-04-29 DIAGNOSIS — M545 Low back pain: Secondary | ICD-10-CM | POA: Diagnosis not present

## 2012-05-03 ENCOUNTER — Ambulatory Visit (HOSPITAL_COMMUNITY): Payer: Medicare Other

## 2012-06-23 LAB — COMPREHENSIVE METABOLIC PANEL
Albumin: 3.8 g/dL (ref 3.4–5.0)
Alkaline Phosphatase: 114 U/L (ref 50–136)
BUN: 16 mg/dL (ref 7–18)
Bilirubin,Total: 0.5 mg/dL (ref 0.2–1.0)
Calcium, Total: 9.2 mg/dL (ref 8.5–10.1)
Chloride: 107 mmol/L (ref 98–107)
Creatinine: 0.79 mg/dL (ref 0.60–1.30)
EGFR (African American): >60
EGFR (Non-African Amer.): >60
Glucose: 153 mg/dL — ABNORMAL HIGH (ref 65–99)
Total Protein: 7.3 g/dL (ref 6.4–8.2)

## 2012-06-23 LAB — URINALYSIS, COMPLETE
Glucose,UR: NEGATIVE mg/dL (ref 0–75)
Ph: 5 (ref 4.5–8.0)
Protein: NEGATIVE
WBC UR: 7 /HPF (ref 0–5)

## 2012-06-23 LAB — CBC
HGB: 13.9 g/dL (ref 12.0–16.0)
MCH: 28.7 pg (ref 26.0–34.0)
RBC: 4.82 10*6/uL (ref 3.80–5.20)
WBC: 8.6 10*3/uL (ref 3.6–11.0)

## 2012-06-23 LAB — ETHANOL
Ethanol %: <0.003 % (ref 0.000–0.080)
Ethanol: <3 mg/dL

## 2012-06-23 LAB — TSH: Thyroid Stimulating Horm: 0.11 u[IU]/mL — ABNORMAL LOW

## 2012-06-23 LAB — DRUG SCREEN, URINE
Barbiturates, Ur Screen: NEGATIVE (ref ?–200)
Cannabinoid 50 Ng, Ur ~~LOC~~: NEGATIVE (ref ?–50)
Methadone, Ur Screen: NEGATIVE (ref ?–300)
Opiate, Ur Screen: NEGATIVE (ref ?–300)

## 2012-06-23 LAB — T4, FREE: Free Thyroxine: 1.2 ng/dL (ref 0.76–1.46)

## 2012-06-24 ENCOUNTER — Inpatient Hospital Stay: Payer: Self-pay | Admitting: Psychiatry

## 2012-06-24 DIAGNOSIS — F3289 Other specified depressive episodes: Secondary | ICD-10-CM | POA: Diagnosis not present

## 2012-06-24 DIAGNOSIS — F3189 Other bipolar disorder: Secondary | ICD-10-CM | POA: Diagnosis not present

## 2012-06-24 DIAGNOSIS — K5900 Constipation, unspecified: Secondary | ICD-10-CM | POA: Diagnosis present

## 2012-06-24 DIAGNOSIS — E039 Hypothyroidism, unspecified: Secondary | ICD-10-CM | POA: Diagnosis not present

## 2012-06-24 DIAGNOSIS — J45909 Unspecified asthma, uncomplicated: Secondary | ICD-10-CM | POA: Diagnosis present

## 2012-06-24 DIAGNOSIS — F429 Obsessive-compulsive disorder, unspecified: Secondary | ICD-10-CM | POA: Diagnosis not present

## 2012-06-24 DIAGNOSIS — F319 Bipolar disorder, unspecified: Secondary | ICD-10-CM | POA: Diagnosis not present

## 2012-06-24 DIAGNOSIS — G43909 Migraine, unspecified, not intractable, without status migrainosus: Secondary | ICD-10-CM | POA: Diagnosis not present

## 2012-06-24 DIAGNOSIS — F639 Impulse disorder, unspecified: Secondary | ICD-10-CM | POA: Diagnosis present

## 2012-06-24 DIAGNOSIS — F329 Major depressive disorder, single episode, unspecified: Secondary | ICD-10-CM | POA: Diagnosis not present

## 2012-06-24 DIAGNOSIS — M549 Dorsalgia, unspecified: Secondary | ICD-10-CM | POA: Diagnosis not present

## 2012-06-24 DIAGNOSIS — M4120 Other idiopathic scoliosis, site unspecified: Secondary | ICD-10-CM | POA: Diagnosis not present

## 2012-06-24 DIAGNOSIS — G8929 Other chronic pain: Secondary | ICD-10-CM | POA: Diagnosis present

## 2012-06-24 DIAGNOSIS — R45851 Suicidal ideations: Secondary | ICD-10-CM | POA: Diagnosis not present

## 2012-06-25 LAB — BEHAVIORAL MEDICINE 1 PANEL
Alkaline Phosphatase: 98 U/L (ref 50–136)
Anion Gap: 7 (ref 7–16)
BUN: 16 mg/dL (ref 7–18)
Bilirubin,Total: 0.7 mg/dL (ref 0.2–1.0)
Chloride: 107 mmol/L (ref 98–107)
Comment - H1-Com1: NORMAL
Eosinophil: 1 %
Lymphocytes: 54 %
MCH: 28.9 pg (ref 26.0–34.0)
MCHC: 33.7 g/dL (ref 32.0–36.0)
MCV: 86 fL (ref 80–100)
Osmolality: 283 (ref 275–301)
Potassium: 3.7 mmol/L (ref 3.5–5.1)
RDW: 13 % (ref 11.5–14.5)
Segmented Neutrophils: 17 %
Variant Lymphocyte - H1-Rlymph: 24 %

## 2012-06-28 LAB — URINALYSIS, COMPLETE
Bacteria: NONE SEEN
Bilirubin,UR: NEGATIVE
Blood: NEGATIVE
Glucose,UR: NEGATIVE mg/dL (ref 0–75)
Ketone: NEGATIVE
Leukocyte Esterase: NEGATIVE
Nitrite: NEGATIVE
RBC,UR: 1 /HPF (ref 0–5)
WBC UR: <1 /HPF (ref 0–5)

## 2012-07-09 DIAGNOSIS — F3162 Bipolar disorder, current episode mixed, moderate: Secondary | ICD-10-CM | POA: Diagnosis not present

## 2012-07-21 ENCOUNTER — Telehealth: Payer: Self-pay | Admitting: Internal Medicine

## 2012-07-21 DIAGNOSIS — R911 Solitary pulmonary nodule: Secondary | ICD-10-CM

## 2012-07-21 NOTE — Telephone Encounter (Signed)
MR please advise if pt can have the ct scan prior to her appt with you or do you want to see her first then schedule the Ct scan.  Please advise. Thanks  Allergies  Allergen Reactions  . Betadine (Povidone Iodine)   . Latex   . Levaquin (Levofloxacin In D5w)     Painful joints   . Cephalexin Itching and Rash  . Doxycycline Itching and Rash  . Penicillins Itching and Rash  . Tetracyclines & Related Itching and Rash

## 2012-07-24 NOTE — Telephone Encounter (Signed)
Last scan nov 2012 showed 4mm nodule. This is one year followup for low prob nodule. Should have had it nov 2013 but ok to have it now. Whatever is conveneient to her; before or after seeing me. Doubt results will change management. IF she has it after, I will call her with result   Dr. Kalman Shan, M.D., [facility].C.P Pulmonary and Critical Care Medicine Staff Physician [facility] Pager: [phone], If no answer or between  15:00h - 7:00h: call [phone]  07/24/2012 10:04 PM

## 2012-07-25 NOTE — Telephone Encounter (Signed)
Pt is aware and wants CT first so order placed. Carron Curie, CMA

## 2012-08-10 ENCOUNTER — Other Ambulatory Visit: Payer: Medicare Other

## 2012-08-10 DIAGNOSIS — F3162 Bipolar disorder, current episode mixed, moderate: Secondary | ICD-10-CM | POA: Diagnosis not present

## 2012-08-11 ENCOUNTER — Ambulatory Visit (INDEPENDENT_AMBULATORY_CARE_PROVIDER_SITE_OTHER)
Admission: RE | Admit: 2012-08-11 | Discharge: 2012-08-11 | Disposition: A | Discharge status: Home / Self Care | Payer: Medicare Other | Source: Ambulatory Visit | Attending: Internal Medicine | Admitting: Internal Medicine

## 2012-08-11 DIAGNOSIS — R911 Solitary pulmonary nodule: Secondary | ICD-10-CM | POA: Diagnosis not present

## 2012-08-11 DIAGNOSIS — J984 Other disorders of lung: Secondary | ICD-10-CM | POA: Diagnosis not present

## 2012-08-17 ENCOUNTER — Ambulatory Visit (INDEPENDENT_AMBULATORY_CARE_PROVIDER_SITE_OTHER): Payer: Medicare Other | Admitting: Family Medicine

## 2012-08-17 ENCOUNTER — Encounter: Payer: Self-pay | Admitting: Family Medicine

## 2012-08-17 VITALS — BP 128/82 | Wt 135.4 lb

## 2012-08-17 DIAGNOSIS — M8580 Other specified disorders of bone density and structure, unspecified site: Secondary | ICD-10-CM | POA: Insufficient documentation

## 2012-08-17 DIAGNOSIS — E038 Other specified hypothyroidism: Secondary | ICD-10-CM | POA: Insufficient documentation

## 2012-08-17 DIAGNOSIS — M549 Dorsalgia, unspecified: Secondary | ICD-10-CM | POA: Diagnosis not present

## 2012-08-17 DIAGNOSIS — M949 Disorder of cartilage, unspecified: Secondary | ICD-10-CM | POA: Diagnosis not present

## 2012-08-17 DIAGNOSIS — R911 Solitary pulmonary nodule: Secondary | ICD-10-CM | POA: Diagnosis not present

## 2012-08-17 DIAGNOSIS — E782 Mixed hyperlipidemia: Secondary | ICD-10-CM

## 2012-08-17 DIAGNOSIS — M899 Disorder of bone, unspecified: Secondary | ICD-10-CM | POA: Diagnosis not present

## 2012-08-17 DIAGNOSIS — G8929 Other chronic pain: Secondary | ICD-10-CM | POA: Insufficient documentation

## 2012-08-17 DIAGNOSIS — E039 Hypothyroidism, unspecified: Secondary | ICD-10-CM

## 2012-08-17 MED ORDER — FENTANYL 75 MCG/HR TD PT72: 1.0000 | MEDICATED_PATCH | TRANSDERMAL | Status: DC

## 2012-08-17 MED ORDER — HYDROCODONE-ACETAMINOPHEN 10-325 MG PO TABS: 2.0000 | ORAL_TABLET | Freq: Four times a day (QID) | ORAL | Status: DC | PRN

## 2012-08-17 MED ORDER — ALENDRONATE SODIUM 70 MG PO TABS: 70.0000 mg | ORAL_TABLET | ORAL | Status: DC

## 2012-08-17 NOTE — Patient Instructions (Signed)
Calcium 600 one twice a day  800i.u. Vit D daily

## 2012-08-17 NOTE — Progress Notes (Signed)
  Subjective:    Patient ID: Jenna Contreras, female    DOB: 1946/03/09, 67 y.o.   MRN: [id number]  HPIPatient is in today multiple different things she relates severe pain discomfort in her back she has scoliosis she also has bulging discs she has intermittent pain not only in her back and down the arms and in the legs. In addition to this she denies any shortness of breath chest pressure tightness nausea vomiting diarrhea or fever. She does relate she takes her pain medicine she uses the patch every 3 days she uses hydrocodone anywhere between 1 and 3 per day. She denies abusing it and does not feel she's addicted. In addition to this she is worried about a CAT scan she recently had she has an appointment with the pulmonologist in the near future. Patient with history of scoliosis back problems thyroid lipid in addition to this chronic pain plus also in anxiety she sees a psychiatrist plus also pulmonary nodules Family history noncontributory Social doesn't smoke she is married under a lot of stress in her marriage but she does love her husband.    Review of Systems See per above.No weight loss. No sweats    Objective:   Physical Exam Scoliosis noted subjective discomfort in the back lungs clear heart regular pulse normal extremities no edema       Assessment & Plan:  Pulmonary nodule  Chronic back pain - Plan: CBC with Differential, Hepatic function panel, Basic metabolic panel  Hypothyroid - Plan: T4, free, TSH  Osteopenia  Mixed hyperlipidemia - Plan: Lipid panel  Given her scoliosis as well as her lower back pain I recommend we refer her to do scoliosis centered. They may have to do a multidisciplinary approach between the specialist and possible surgery. Continue current pain medication. Refills given followup 3 months. Greater than half time spent in consultation and 25-30 minutes spent with patient [phone]

## 2012-08-18 NOTE — Progress Notes (Signed)
Quick Note:  lmomtcb for pt ______ 

## 2012-08-19 ENCOUNTER — Telehealth: Payer: Self-pay | Admitting: Internal Medicine

## 2012-08-19 NOTE — Telephone Encounter (Signed)
Notes Recorded by Kalman Shan, MD on 08/18/2012 at 10:35 AM Lung nodule no change since nov 2012 -> June 2014. No further fu needed for nodule  -------  Called, spoke with pt.  Informed her of above results and recs per MR.  She verbalized understanding of this and voiced no further questions or concerns at this time.  She does have an OV with MR on June 26.  I advised she should keep this OV.  She verbalized understanding and was in agreement with this plan.

## 2012-08-19 NOTE — Progress Notes (Signed)
Quick Note:  Spoke with pt. Informed her of ct chest results and recs per MR. She verbalized understanding. ______

## 2012-08-22 ENCOUNTER — Ambulatory Visit: Payer: Medicare Other | Admitting: Internal Medicine

## 2012-09-01 ENCOUNTER — Ambulatory Visit (INDEPENDENT_AMBULATORY_CARE_PROVIDER_SITE_OTHER): Payer: Medicare Other | Admitting: Internal Medicine

## 2012-09-01 ENCOUNTER — Encounter: Payer: Self-pay | Admitting: Internal Medicine

## 2012-09-01 VITALS — BP 100/62 | HR 73 | Temp 97.6°F | Ht 61.0 in | Wt 136.4 lb

## 2012-09-01 DIAGNOSIS — J45909 Unspecified asthma, uncomplicated: Secondary | ICD-10-CM | POA: Diagnosis not present

## 2012-09-01 DIAGNOSIS — J4530 Mild persistent asthma, uncomplicated: Secondary | ICD-10-CM

## 2012-09-01 NOTE — Progress Notes (Signed)
Subjective:    Patient ID: Jenna Contreras, female    DOB: [birth year], 67 y.o.   MRN: [id number]  HPI IOV Nov 7384:  67 year old female accompanied with husband. States was in hospital ([facility]) with "pneumonia" around early September 2012. Spent 1 week in hospital. At discharge she was informed that the pneumonia had cleared and she no longer needed bronch but needed followup 5 weeks. States she was given a week of levoflox but now realizes that she should not have been given that due to risk of ligament injury. Around 11/25/10 visited [facility] (at this time unhappy with [facility]) due to persistence of dry cough and diffuse bodyache (concerned side effect of levoflox). CXR was clear (personally reviewed). Had CBC, bmet, ck, esr - all normal. Ionized calcium was slightly normal.   Currently being bothered by dyspnea and chronic cough and also states that she forgot to mention she has birds. Reports dyspnea since admission in sept 2012. Feels likely getting worse. Rates it as moderate-severe. Even trying to talk or exertion like getting out of bed makes her dyspneic. Dyspnea improved by nebs. Cough also present since pneumonia. Was productive of brown sputum but now dry. Rates cough as moderate- severe. Coughs during sleep and when changing posture from supine to sitting. There is associated wheezing present.  Denies hemoptysis,  Both preventing her from cleaning birds room (see below)  PMHx 1. Asthma  - diagnosed mid 42s. Unclear how dx was made but she reckons it was based on PEF. Describes cough variant asthma. Hx of "respiratory arrest" in 1998 + but says she refused to go to ER and get Rx. Uses albuterol prn for asthma "attacks". Typicall gets attacks 1-4 times per week upon exposure to birds at home  2. Scoliosis  3. Admitted for depression and OCD in 2007  4. Irritable bowel syndrome.  2. Arthritis.  3. Scoliosis.  4. Migraines.  5. Hypothyroidism.  6.  Fibromyalgia.  Social 1. Birds - parrots with lot of feather dust in one room. Since 2000. Large Macaw and a cocakteel and a quaker. Total 3 birds. Gets exposed atleast 1-2 per week 2. Works as Engineer, site and domestic Corporate treasurer.  3. Husband and she work together in Personnel officer. In [place], [place]. Married x 27 years 4. Ex-smoker. Smoked cigs 15-20  Years x 1 ppd. Quit 35 years ago 5. Denies MJ, coccaine.  6. Severeal grandkids and 1 great grand kid    Nurse wil walk you for oxygen levels  Have full PFT breathing test - once test done, give [place] a call so I can review you aand advise over phone next step  Telephone call Nov 2012 Updated patient on CT results NOv 2012 -   #nomral lung parenchyma. Still with cough and dyspnea.  DDx of post pna cough taking time to resolve explained but she prefers workup now. She does not think that is the case  #small lung nodule < 4mm  Plan as agreed with patient  - get records from Dr Sidney Ace  Esp spirometry, noptes, and allergy testing - [name] please organize this and leave in my look at  - if above are unhelpful, will get Methacholine chalenge and if that is negative will get CPST  - neeeds repeat CT chest wtihout contrast in 1  year     OV 08/05/2011  In terms of dyspnea and cough - lot better, almost gone. Has not had any asthma attacks  since sept 2012. Not on any maintenance mdi. Learning today not seen Dr Laneta Simmers since 1996; so no records on details of the asthma diagnosis.Marland Kitchen However, she does report wheezing oonce daily with exposure to her birds. No associaed symptoms. No relieving factors   Past, Family, Social reviewed: reported TIA episodes 2 time since last visit. Says PMD refused to get CT brain and therefore wants me to address this isuseu. Most recent TIA 4 weeks ago was very upset with husband. Feels bp rose and next day could not remember peoples names and had word amnesia.  Still struggling with remembering. No focal weakness. Just memory issues.  #Memory issues  - will refer to neurology  #Wheezing  - have methacholine challenge test  #Followup  - will call with result  OV 09/03/2011 Here to discuss methacholine challenge results of 08/12/11:  - 25% reducction in fvc and 28% reductiion in fev1 at dose - and c/w asthma/ Her interimittent wheeze continues and is of moderate severity. We discussed her birds: clearly she is very emotionally involved about her birds because she has had them > 10 years. There  Is lot of bird dust exposure but she says they talk to her and are emotionally dependent on her.  Otherwise no new resp issues  Past, Family, Social reviewed: no change since last visit but intermittent "tia" symptoms of blurred vision continue. Neuro appt is only 10/03/11 per hx. She interested in speeding this up.  #ASthma  - methacholine challenge test consistent with asthma  - start qvar 2 puff twice daily with spacer; nurse will give sample and teach technique and give script  - understand that cutting down bird exposure is difficult; we will closely monitor your lung health  #Followup  - 3 months or sooner for asthma followup  - our coordinator will see if neurologist can see you sooner than July 27th, 2013   OV 12/03/2011 Followup mild persistent asthma.   In interim: Took qvar for a while and then stopped because she was feeling well though she knew that qvar is maintenance. She says no adversed events since coming off qvar. Asymptomatic. No albuterol rescue use. No nocturnal awakening. No wheeze. No dyspnea. No cough. She has had her flu shot. She wants to monitor herself off qvar  Past, Family, Social reviewed: seen by Dr Modesto Charon of neuro per her hx: no TIA. One bird died. Another bird has cancer and expected to die in weeks to months   REC Glad you are feeling better and asymptomatic  Glad you had flu shot  As discussed we agreed that we  will watch you off QVAR  Return in 9 months or sooner if needed  OV 09/01/2012  Since last visit overall well but on 05/02/12 went tot [facility] ER for for atypical chest pain, Didimer and cxr negative. Sent home ast atypical chest pain. IN termof asthma" she feels wihtou QVAR she is more symptomatich with chest tightness and dyspnea. Still albuterol use is reare and nocturnal awakenings are none. No wheeizing. One bird still alive andthere is lot of feather dust. Husband says she wont wear mask. She prefers to go back on QVAR      Notes Recorded by Kalman Shan, MD on 08/18/2012 at 10:35 AM Lung nodule no change since nov 2012 -> June 2014. No further fu needed for nodule      Past, Family, Social reviewed: no change since last visit     Review of Systems  Constitutional:  Negative for fever and unexpected weight change.  HENT: Negative for ear pain, nosebleeds, congestion, sore throat, rhinorrhea, sneezing, trouble swallowing, dental problem, postnasal drip and sinus pressure.   Eyes: Negative for redness and itching.  Respiratory: Positive for cough and wheezing. Negative for chest tightness and shortness of breath.   Cardiovascular: Negative for palpitations and leg swelling.  Gastrointestinal: Negative for nausea and vomiting.  Genitourinary: Negative for dysuria.  Musculoskeletal: Negative for joint swelling.  Skin: Negative for rash.  Neurological: Negative for headaches.  Hematological: Does not bruise/bleed easily.  Psychiatric/Behavioral: Negative for dysphoric mood. The patient is not nervous/anxious.        Objective:   Physical Exam Vitals reviewed. Constitutional: She is oriented to person, place, and time. She appears well-developed and well-nourished. No distress.  HENT:  Head: Normocephalic and atraumatic.  Right Ear: External ear normal.  Left Ear: External ear normal.  Mouth/Throat: Oropharynx is clear and moist. No oropharyngeal exudate.  Eyes: Conjunctivae  and EOM are normal. Pupils are equal, round, and reactive to light. Right eye exhibits no discharge. Left eye exhibits no discharge. No scleral icterus.  Neck: Normal range of motion. Neck supple. No JVD present. No tracheal deviation present. No thyromegaly present.  Cardiovascular: Normal rate, regular rhythm, normal heart sounds and intact distal pulses.  Exam reveals no gallop and no friction rub.   No murmur heard. Pulmonary/Chest: Effort normal. No respiratory distress. She has no wheezes.  She exhibits no tenderness.       Abdominal: Soft. Bowel sounds are normal. She exhibits no distension and no mass. There is no tenderness. There is no rebound and no guarding.  Musculoskeletal: Normal range of motion. She exhibits no edema and no tenderness.  Lymphadenopathy:    She has no cervical adenopathy.  Neurological: She is alert and oriented to person, place, and time. She has normal reflexes. No cranial nerve deficit. She exhibits normal muscle tone. Coordination normal.  Skin: Skin is warm and dry. No rash noted. She is not diaphoretic. No erythema. No pallor.  Psychiatric: She has a normal mood and affect. Her behavior is normal. Judgment and thought content normal.         Assessment & Plan:

## 2012-09-01 NOTE — Patient Instructions (Addendum)
I think your asthma is acting up with the heat and bird exposure and without QVAR Go back to QVAR 2puff twice daily, CMA will ensure proper technique Return in 9 months orsooner if needed Flu shot in fall

## 2012-09-04 NOTE — Assessment & Plan Note (Signed)
I think your asthma is acting up with the heat and bird exposure and without QVAR Go back to QVAR 80mcg 2puff twice daily, CMA will ensure proper technique Return in 9 months orsooner if needed Flu shot in fall 

## 2012-09-06 DIAGNOSIS — G8929 Other chronic pain: Secondary | ICD-10-CM | POA: Diagnosis not present

## 2012-09-06 DIAGNOSIS — M549 Dorsalgia, unspecified: Secondary | ICD-10-CM | POA: Diagnosis not present

## 2012-09-06 DIAGNOSIS — E039 Hypothyroidism, unspecified: Secondary | ICD-10-CM | POA: Diagnosis not present

## 2012-09-06 DIAGNOSIS — E782 Mixed hyperlipidemia: Secondary | ICD-10-CM | POA: Diagnosis not present

## 2012-09-06 LAB — CBC WITH DIFFERENTIAL/PLATELET
Basophils Absolute: 0.1 10*3/uL (ref 0.0–0.1)
HCT: 40.1 % (ref 36.0–46.0)
Hemoglobin: 13.5 g/dL (ref 12.0–15.0)
Lymphocytes Relative: 51 % — ABNORMAL HIGH (ref 12–46)
Monocytes Absolute: 0.4 10*3/uL (ref 0.1–1.0)
Neutro Abs: 2.5 10*3/uL (ref 1.7–7.7)
Neutrophils Relative %: 38 % — ABNORMAL LOW (ref 43–77)
RDW: 14.2 % (ref 11.5–15.5)
WBC: 6.5 10*3/uL (ref 4.0–10.5)

## 2012-09-06 LAB — BASIC METABOLIC PANEL
BUN: 15 mg/dL (ref 6–23)
Calcium: 9.1 mg/dL (ref 8.4–10.5)
Creat: 0.71 mg/dL (ref 0.50–1.10)

## 2012-09-06 LAB — T4, FREE: Free T4: 0.98 ng/dL (ref 0.80–1.80)

## 2012-09-06 LAB — LIPID PANEL
Cholesterol: 177 mg/dL (ref 0–200)
HDL: 52 mg/dL (ref >39)
LDL Cholesterol: 107 mg/dL — ABNORMAL HIGH (ref 0–99)
Triglycerides: 88 mg/dL (ref <150)

## 2012-09-06 LAB — HEPATIC FUNCTION PANEL
ALT: 19 U/L (ref 0–35)
AST: 20 U/L (ref 0–37)
Albumin: 3.9 g/dL (ref 3.5–5.2)
Alkaline Phosphatase: 87 U/L (ref 39–117)
Total Protein: 6.3 g/dL (ref 6.0–8.3)

## 2012-09-13 ENCOUNTER — Encounter: Payer: Self-pay | Admitting: *Deleted

## 2012-09-13 MED ORDER — LEVOTHYROXINE SODIUM 100 MCG PO TABS: 100.0000 ug | ORAL_TABLET | Freq: Every day | ORAL | Status: DC

## 2012-09-13 NOTE — Addendum Note (Signed)
Addended by: Margaretha Sheffield on: 09/13/2012 11:38 AM   Modules accepted: Orders

## 2012-09-16 DIAGNOSIS — F3162 Bipolar disorder, current episode mixed, moderate: Secondary | ICD-10-CM | POA: Diagnosis not present

## 2012-09-27 ENCOUNTER — Telehealth: Payer: Self-pay | Admitting: Family Medicine

## 2012-09-27 MED ORDER — PREDNISONE 20 MG PO TABS: ORAL_TABLET | ORAL | Status: DC

## 2012-09-27 NOTE — Telephone Encounter (Signed)
Prednisone 20 mg, 3qd for 3d then 2qd for 3d then 1qd for 3d, #18 no refills, if bothers stomach then stop med and call

## 2012-09-27 NOTE — Telephone Encounter (Signed)
Sent into pharmacy Prednisone 20 mg, 3qd for 3d then 2qd for 3d then 1qd for 3d, #18 no refills. Left message on voicemail notifying patient.

## 2012-09-27 NOTE — Telephone Encounter (Signed)
Patient states she has a bad case of poison ivy and would like something called in.    [facility]

## 2012-09-29 DIAGNOSIS — F3162 Bipolar disorder, current episode mixed, moderate: Secondary | ICD-10-CM | POA: Diagnosis not present

## 2012-10-13 DIAGNOSIS — F3162 Bipolar disorder, current episode mixed, moderate: Secondary | ICD-10-CM | POA: Diagnosis not present

## 2012-10-18 ENCOUNTER — Telehealth: Payer: Self-pay | Admitting: Family Medicine

## 2012-10-18 ENCOUNTER — Other Ambulatory Visit: Payer: Self-pay

## 2012-10-18 MED ORDER — LEVOTHYROXINE SODIUM 100 MCG PO TABS: 100.0000 ug | ORAL_TABLET | Freq: Every day | ORAL | Status: DC

## 2012-10-18 NOTE — Telephone Encounter (Signed)
Left message on voicemail notifying patient refill was sent into pharmacy.

## 2012-10-18 NOTE — Telephone Encounter (Signed)
Patient states she needs a prescription for levothyroxine (SYNTHROID) 100 MCG tablet send to [facility].  There is a letter attached from [facility] that states they have tried to contact our office and was unable to get authorization for this prescription.  I was unable to locate documentation where [facility] has contacted our office.    Please call Patient. Thanks

## 2012-10-27 DIAGNOSIS — F3162 Bipolar disorder, current episode mixed, moderate: Secondary | ICD-10-CM | POA: Diagnosis not present

## 2012-10-27 DIAGNOSIS — Z79899 Other long term (current) drug therapy: Secondary | ICD-10-CM | POA: Diagnosis not present

## 2012-11-20 ENCOUNTER — Encounter (HOSPITAL_COMMUNITY): Payer: Self-pay

## 2012-11-22 ENCOUNTER — Encounter: Payer: Self-pay | Admitting: Family Medicine

## 2012-11-22 ENCOUNTER — Ambulatory Visit (INDEPENDENT_AMBULATORY_CARE_PROVIDER_SITE_OTHER): Payer: Medicare Other | Admitting: Family Medicine

## 2012-11-22 VITALS — BP 112/74 | Ht 61.0 in | Wt 143.0 lb

## 2012-11-22 DIAGNOSIS — M549 Dorsalgia, unspecified: Secondary | ICD-10-CM

## 2012-11-22 DIAGNOSIS — E039 Hypothyroidism, unspecified: Secondary | ICD-10-CM | POA: Diagnosis not present

## 2012-11-22 DIAGNOSIS — G8929 Other chronic pain: Secondary | ICD-10-CM | POA: Diagnosis not present

## 2012-11-22 MED ORDER — FENTANYL 50 MCG/HR TD PT72: 1.0000 | MEDICATED_PATCH | TRANSDERMAL | Status: DC

## 2012-11-22 MED ORDER — HYDROCODONE-ACETAMINOPHEN 10-325 MG PO TABS: 1.0000 | ORAL_TABLET | Freq: Four times a day (QID) | ORAL | Status: DC | PRN

## 2012-11-22 MED ORDER — CYCLOBENZAPRINE HCL 10 MG PO TABS: 10.0000 mg | ORAL_TABLET | Freq: Two times a day (BID) | ORAL | Status: DC | PRN

## 2012-11-22 MED ORDER — RIZATRIPTAN BENZOATE 10 MG PO TABS: 10.0000 mg | ORAL_TABLET | Freq: Once | ORAL | Status: DC | PRN

## 2012-11-22 MED ORDER — DICLOFENAC SODIUM 75 MG PO TBEC: 75.0000 mg | DELAYED_RELEASE_TABLET | Freq: Two times a day (BID) | ORAL | Status: DC

## 2012-11-22 NOTE — Patient Instructions (Addendum)
As part of your visit today we have covered chronic pain. You have been given prescription for pain medicines. Certain pain medications such as hydrocodone  allow for refills. Other pain medications such as oxycodone require separate prescriptions. Nonetheless, your expected to come in for a office visit before further pain medications are issued.   We will not refill medications or early nor will we give an extended month supply at the end of these prescriptions.It is your responsibility to keep up with medications. They will not be replaced.  It is your responsibility to schedule an office visit in 3-4 months to be seen before you are out of your medication. Do not call our office to request early refills or additional refills.   We are required by law to adhere to regulations. Failure on our part to follow these regulations could jeopardize our prescription license which in turn would cause [place] not to be able to care for you.  All pain meds can cause drowsiness, if drowsy then you should not operate vehicles or heavy equipment.

## 2012-11-22 NOTE — Progress Notes (Signed)
  Subjective:    Patient ID: Jenna Contreras, female    DOB: 16-Oct-1945, 67 y.o.   MRN: [id number]  HPIHere for check up.   Wants to discuss her hair falling out. May have been due to Lithium from her therapist. Having side effects. Pt was put back on Lamictal  Having back pain. She collapsed this morning.Happens suddenly- legs collapse.  She states this has been going on for awhile. Back pain at a 8. Doing things in her back within reason.  Last colon 2013, last mamo 2011 Past medical history benign chronic back pain patient has a tendency to take significant amount medications. Denies abusing it.   Review of Systems See above. No vomiting diarrhea wheezing or difficulty breathing no fever chills sweats    Objective:   Physical Exam Neck no masses lungs are clear hearts regular abdomen is soft back nontender negative straight leg raise       Assessment & Plan:  Hypothyroid- recheck tsh/t4 Back pain - fentanyl 50 , 3 prescriptions were given. Is possibility she may have to go to 75 mg in the future. Chronic pain as well. Hydrocodone 121 4 times a day with refills followup in 3 months Migraines are stable she requests refills regarding Maxalt Moderate osteoarthritis she requests prescription for Voltaren Back spasms she requests refills of Flexeril recommended no greater than twice per day

## 2012-11-24 DIAGNOSIS — F3162 Bipolar disorder, current episode mixed, moderate: Secondary | ICD-10-CM | POA: Diagnosis not present

## 2012-11-30 ENCOUNTER — Telehealth: Payer: Self-pay | Admitting: Family Medicine

## 2012-11-30 ENCOUNTER — Other Ambulatory Visit: Payer: Self-pay | Admitting: Family Medicine

## 2012-11-30 DIAGNOSIS — R079 Chest pain, unspecified: Secondary | ICD-10-CM

## 2012-11-30 NOTE — Telephone Encounter (Signed)
I put the referral in, please process, thank you, inform patient

## 2012-11-30 NOTE — Telephone Encounter (Signed)
Pt states at her last office visit here we were supposed to do a cardio referral back to Dr. Diona Browner for the chest pain that she experienced one evening for about 4 hours, states she told you that she did take some Nitroglycerin x2 and it helped.  Nothing noted in her last office note, no cardio referral in my "Q".  Pt also states she's unsure if we did a EKG that day.  Please advise

## 2012-12-07 ENCOUNTER — Encounter: Payer: Self-pay | Admitting: *Deleted

## 2012-12-07 ENCOUNTER — Ambulatory Visit (INDEPENDENT_AMBULATORY_CARE_PROVIDER_SITE_OTHER): Payer: Medicare Other | Admitting: Adult Health

## 2012-12-07 ENCOUNTER — Encounter: Payer: Self-pay | Admitting: Adult Health

## 2012-12-07 VITALS — BP 120/64 | HR 71 | Ht 63.0 in | Wt 145.0 lb

## 2012-12-07 DIAGNOSIS — J4530 Mild persistent asthma, uncomplicated: Secondary | ICD-10-CM

## 2012-12-07 DIAGNOSIS — R072 Precordial pain: Secondary | ICD-10-CM | POA: Insufficient documentation

## 2012-12-07 DIAGNOSIS — R079 Chest pain, unspecified: Secondary | ICD-10-CM

## 2012-12-07 DIAGNOSIS — I10 Essential (primary) hypertension: Secondary | ICD-10-CM

## 2012-12-07 DIAGNOSIS — J45909 Unspecified asthma, uncomplicated: Secondary | ICD-10-CM

## 2012-12-07 MED ORDER — NITROGLYCERIN 0.4 MG SL SUBL: 0.4000 mg | SUBLINGUAL_TABLET | SUBLINGUAL | Status: DC | PRN

## 2012-12-07 NOTE — Assessment & Plan Note (Signed)
Continue care by [facility].

## 2012-12-07 NOTE — Progress Notes (Signed)
HPI: Jenna Contreras is a 67 year old patient of Dr. Diona Browner we are following for ongoing assessment and management of questionable TIAs. The patient has a history of confusion, memory deficits, intermittent visual changes with amaurosis fugax. She was last seen by Dr. Diona Browner in July of 2013 were 8 carotid Doppler ultrasound was completed, which was negative for significant stenosis of the carotid arteries. She was to follow with neurology, Dr. Modesto Charon. His comment was I think these are unlikely to be TIA or seizures. I suspect it may be medication toxicity.   She comes today with complaints of "severe chest pain" feeling like her chest would explode. Lasted 4 hours. Has been seen numerous time at [facility] ED and is sent home. Saw PCP, Dr. Gerda Diss, who referred to our office for re-evaluation. She states that she took NTG that she has had since 2011. This gave her relief. She has had a second occurrence of chest pain a few days later. She immediately took NTG and pain was relieved. She has had no further chest pain since.   Allergies  Allergen Reactions  . Ampicillin   . Betadine [Povidone Iodine]   . Keflex [Cephalexin]   . Latex   . Levaquin [Levofloxacin In D5w]     Painful joints   . Cephalexin Itching and Rash  . Doxycycline Itching and Rash  . Penicillins Itching and Rash  . Tetracyclines & Related Itching and Rash    Current Outpatient Prescriptions  Medication Sig Dispense Refill  . albuterol (PROVENTIL HFA;VENTOLIN HFA) 108 (90 BASE) MCG/ACT inhaler Inhale 1-2 puffs into the lungs every 6 (six) hours as needed for wheezing.  1 Inhaler  0  . alendronate (FOSAMAX) 70 MG tablet Take 1 tablet (70 mg total) by mouth every 7 (seven) days. Take with a full glass of water on an empty stomach.  4 tablet  11  . cyclobenzaprine (FLEXERIL) 10 MG tablet Take 1 tablet (10 mg total) by mouth 2 (two) times daily as needed for muscle spasms.  180 tablet  0  . diclofenac (VOLTAREN) 75 MG EC  tablet Take 1 tablet (75 mg total) by mouth 2 (two) times daily.  180 tablet  1  . escitalopram (LEXAPRO) 10 MG tablet Take 10 mg by mouth daily.      . fentaNYL (DURAGESIC) 50 MCG/HR Place 1 patch (50 mcg total) onto the skin every 3 (three) days.  10 patch  0  . HYDROcodone-acetaminophen (NORCO) 10-325 MG per tablet Take 1 tablet by mouth every 6 (six) hours as needed. pain  120 tablet  2  . lamoTRIgine (LAMICTAL) 100 MG tablet Take 100 mg by mouth 2 (two) times daily.      Marland Kitchen levothyroxine (SYNTHROID) 100 MCG tablet Take 1 tablet (100 mcg total) by mouth daily before breakfast.  90 tablet  1  . LORazepam (ATIVAN) 1 MG tablet Take 1 mg by mouth at bedtime.      . nitroGLYCERIN (NITROSTAT) 0.4 MG SL tablet Place 1 tablet (0.4 mg total) under the tongue every 5 (five) minutes as needed for chest pain.  25 tablet  4  . ondansetron (ZOFRAN) 8 MG tablet Take by mouth every 8 (eight) hours as needed for nausea.      . rizatriptan (MAXALT) 10 MG tablet Take 1 tablet (10 mg total) by mouth once as needed for migraine. May repeat in 2 hours if needed  36 tablet  2   No current facility-administered medications for this visit.  Past Medical History  Diagnosis Date  . Scoliosis   . Migraines   . IBS (irritable bowel syndrome)   . Arthritis   . Hypothyroidism   . Asthma   . Fibromyalgia   . Depression   . OCD (obsessive compulsive disorder)   . Arthritis   . Osteopenia   . Spinal pain     Past Surgical History  Procedure Laterality Date  . Dilation and curettage of uterus  x3  . Tubal ligation    . Arthroscopic left knee surgery    . Wrist surgery    . Mandible surgery    . Osteotomy      NFA:OZHYQM of systems complete and found to be negative unless listed above  PHYSICAL EXAM BP 120/64  Pulse 71  Ht 5\' 3"  (1.6 m)  Wt 145 lb (65.772 kg)  BMI 25.69 kg/m2 General: Well developed, well nourished, in no acute distress Head: Eyes PERRLA, No xanthomas.   Normal cephalic and  atramatic  Lungs: Some minimal inspiratory wheezes. No cough or rhonchi. Heart: HRRR S1 S2, without MRG.  Pulses are 2+ & equal.            No carotid bruit. No JVD.  No abdominal bruits. No femoral bruits. Abdomen: Bowel sounds are positive, abdomen soft and non-tender without masses or                  Hernia's noted. Msk:  Back normal, normal gait. Normal strength and tone for age. Extremities: No clubbing, cyanosis or edema.  DP +1 Neuro: Alert and oriented X 3. Psych:  Good affect, responds appropriately  EKG: NSR rate of 69 bpm.  ASSESSMENT AND PLAN

## 2012-12-07 NOTE — Patient Instructions (Addendum)
Your physician recommends that you schedule a follow-up appointment in: With Dr Diona Browner after test  Your physician has requested that you have en exercise stress myoview. For further information please visit [email or web address] Please follow instruction sheet, as given.  Your physician recommends that you continue on your current medications as directed. Please refer to the Current Medication list given to you today.

## 2012-12-07 NOTE — Progress Notes (Deleted)
Name: Jenna Contreras    DOB: 1945-03-27  Age: 67 y.o.  MR#: [id number]       PCP:  Lilyan Punt, MD      Insurance: Payor: MEDICARE / Plan: MEDICARE PART A AND B / Product Type: *No Product type* /   CC:    Chief Complaint  Patient presents with  . Transient Ischemic Attack    VS Filed Vitals:   12/07/12 1329  BP: 120/64  Pulse: 71  Height: 5\' 3"  (1.6 m)  Weight: 145 lb (65.772 kg)    Weights Current Weight  12/07/12 145 lb (65.772 kg)  11/22/12 143 lb (64.864 kg)  09/01/12 136 lb 6.4 oz (61.871 kg)    Blood Pressure  BP Readings from Last 3 Encounters:  12/07/12 120/64  11/22/12 112/74  09/01/12 100/62     Admit date:  (Not on file) Last encounter with RMR:  Visit date not found   Allergy Ampicillin; Betadine; Keflex; Latex; Levaquin; Cephalexin; Doxycycline; Penicillins; and Tetracyclines & related  Current Outpatient Prescriptions  Medication Sig Dispense Refill  . albuterol (PROVENTIL HFA;VENTOLIN HFA) 108 (90 BASE) MCG/ACT inhaler Inhale 1-2 puffs into the lungs every 6 (six) hours as needed for wheezing.  1 Inhaler  0  . alendronate (FOSAMAX) 70 MG tablet Take 1 tablet (70 mg total) by mouth every 7 (seven) days. Take with a full glass of water on an empty stomach.  4 tablet  11  . cyclobenzaprine (FLEXERIL) 10 MG tablet Take 1 tablet (10 mg total) by mouth 2 (two) times daily as needed for muscle spasms.  180 tablet  0  . diclofenac (VOLTAREN) 75 MG EC tablet Take 1 tablet (75 mg total) by mouth 2 (two) times daily.  180 tablet  1  . escitalopram (LEXAPRO) 10 MG tablet Take 10 mg by mouth daily.      . fentaNYL (DURAGESIC) 50 MCG/HR Place 1 patch (50 mcg total) onto the skin every 3 (three) days.  10 patch  0  . HYDROcodone-acetaminophen (NORCO) 10-325 MG per tablet Take 1 tablet by mouth every 6 (six) hours as needed. pain  120 tablet  2  . lamoTRIgine (LAMICTAL) 100 MG tablet Take 100 mg by mouth 2 (two) times daily.      Marland Kitchen levothyroxine (SYNTHROID) 100  MCG tablet Take 1 tablet (100 mcg total) by mouth daily before breakfast.  90 tablet  1  . LORazepam (ATIVAN) 1 MG tablet Take 1 mg by mouth at bedtime.      . nitroGLYCERIN (NITROSTAT) 0.4 MG SL tablet Place 0.4 mg under the tongue every 5 (five) minutes as needed for chest pain.      Marland Kitchen ondansetron (ZOFRAN) 8 MG tablet Take by mouth every 8 (eight) hours as needed for nausea.      . rizatriptan (MAXALT) 10 MG tablet Take 1 tablet (10 mg total) by mouth once as needed for migraine. May repeat in 2 hours if needed  36 tablet  2   No current facility-administered medications for this visit.    Discontinued Meds:   There are no discontinued medications.  Patient Active Problem List   Diagnosis Date Noted  . Chronic back pain 08/17/2012  . Hypothyroid 08/17/2012  . Osteopenia 08/17/2012  . Hypersomnia 11/18/2011  . Asthma, mild persistent 08/05/2011  . Memory change 08/05/2011  . TIA (transient ischemic attack) 08/05/2011  . Pulmonary nodule 08/05/2011  . SOB (shortness of breath) 01/05/2011    LABS    Component Value Date/Time  NA 139 09/06/2012 0920   NA 136 04/12/2012 1917   NA 139 10/05/2011 1232   K 4.2 09/06/2012 0920   K 4.2 04/12/2012 1917   K 4.8 10/05/2011 1232   CL 103 09/06/2012 0920   CL 101 04/12/2012 1917   CL 105 10/05/2011 1232   CO2 29 09/06/2012 0920   CO2 22 04/12/2012 1917   CO2 26 10/05/2011 1232   GLUCOSE 82 09/06/2012 0920   GLUCOSE 97 04/12/2012 1917   GLUCOSE 98 10/05/2011 1232   BUN 15 09/06/2012 0920   BUN 16 04/12/2012 1917   BUN 13 10/05/2011 1232   CREATININE 0.71 09/06/2012 0920   CREATININE 0.69 04/12/2012 1917   CREATININE 0.9 10/05/2011 1232   CREATININE 1.00 06/20/2011 0047   CALCIUM 9.1 09/06/2012 0920   CALCIUM 9.7 04/12/2012 1917   CALCIUM 10.0 10/05/2011 1232   GFRNONAA 87* 04/12/2012 1917   GFRAA >90 04/12/2012 1917   CMP     Component Value Date/Time   NA 139 09/06/2012 0920   K 4.2 09/06/2012 0920   CL 103 09/06/2012 0920   CO2 29 09/06/2012 0920   GLUCOSE 82  09/06/2012 0920   BUN 15 09/06/2012 0920   CREATININE 0.71 09/06/2012 0920   CREATININE 0.69 04/12/2012 1917   CALCIUM 9.1 09/06/2012 0920   PROT 6.3 09/06/2012 0920   ALBUMIN 3.9 09/06/2012 0920   AST 20 09/06/2012 0920   ALT 19 09/06/2012 0920   ALKPHOS 87 09/06/2012 0920   BILITOT 0.5 09/06/2012 0920   GFRNONAA 87* 04/12/2012 1917   GFRAA >90 04/12/2012 1917       Component Value Date/Time   WBC 6.5 09/06/2012 0920   WBC 8.7 04/12/2012 1917   WBC 7.8 10/05/2011 1232   HGB 13.5 09/06/2012 0920   HGB 14.9 04/12/2012 1917   HGB 14.6 10/05/2011 1232   HCT 40.1 09/06/2012 0920   HCT 43.8 04/12/2012 1917   HCT 43.6 10/05/2011 1232   MCV 83.0 09/06/2012 0920   MCV 86.6 04/12/2012 1917   MCV 87.3 10/05/2011 1232    Lipid Panel     Component Value Date/Time   CHOL 177 09/06/2012 0920   TRIG 88 09/06/2012 0920   HDL 52 09/06/2012 0920   CHOLHDL 3.4 09/06/2012 0920   VLDL 18 09/06/2012 0920   LDLCALC 107* 09/06/2012 0920    ABG    Component Value Date/Time   TCO2 25 06/20/2011 0047     Lab Results  Component Value Date   TSH 5.129* 09/06/2012   BNP (last 3 results)  Recent Labs  04/12/12 1917  PROBNP 42.9   Cardiac Panel (last 3 results) No results found for this basename: CKTOTAL, CKMB, TROPONINI, RELINDX,  in the last 72 hours  Iron/TIBC/Ferritin No results found for this basename: iron, tibc, ferritin     EKG Orders placed in visit on 12/07/12  . EKG 12-LEAD     Prior Assessment and Plan Problem List as of 12/07/2012     Cardiovascular and Mediastinum   TIA (transient ischemic attack)   Last Assessment & Plan   09/29/2011 Office Visit Written 09/29/2011  3:37 PM by Jonelle Sidle, MD     Based on history, possibly at least 2 events over the last 6 months. No history of cardiac dysrhythmia, hypertension, or significant vascular disease. Her ECG is normal. No obvious carotid bruits on examination, although they have not been assessed formally. We willl arrange a carotid Doppler study, hopefully for the same  day that she sees Dr. Modesto Charon in [place] so that he could potentially have the results to go over with her. Otherwise we will inform her of the results.      Respiratory   Asthma, mild persistent   Last Assessment & Plan   09/01/2012 Office Visit Written 09/04/2012  8:03 PM by Kalman Shan, MD     I think your asthma is acting up with the heat and bird exposure and without QVAR Go back to QVAR 2puff twice daily, CMA will ensure proper technique Return in 9 months orsooner if needed Flu shot in fall      Endocrine   Hypothyroid     Musculoskeletal and Integument   Osteopenia     Other   SOB (shortness of breath)   Last Assessment & Plan   12/18/2010 Office Visit Written 01/05/2011  9:25 AM by Kalman Shan, MD     Unclear cause  Plan Nurse wil walk you for oxygen levels Have full PFT breathing test - once test done, give [place] a call so I can review you aand advise over phone next step    Memory change   Last Assessment & Plan   08/05/2011 Office Visit Written 08/05/2011 11:51 PM by Kalman Shan, MD     Acute memory issues without focal deficit that she is calling it as TIA. Will refer to neurology for eval    Pulmonary nodule   Last Assessment & Plan   08/05/2011 Office Visit Written 08/05/2011 11:52 PM by Kalman Shan, MD     < 4mm seen Nov 2012 CT chest  Plan  repeat chst nov 2013    Hypersomnia   Last Assessment & Plan   11/18/2011 Office Visit Written 11/18/2011  7:08 PM by Barbaraann Share, MD     The patient has very profound sleepiness by her history, but it is unclear if this is secondary to a sleep disorder.  She has no history to suggest sleep disordered breathing or a movement disorder of sleep, and the only thing to suggest narcolepsy is her sleepiness that dates back to young adulthood.  She obviously has very poor sleep hygiene, and no set sleep schedule.  She is also taking multiple medications that can alter her alertness level, and it is unknown  how much pain medication she could be taking.  She also has a history of bipolar disease, and I wonder how much this is factoring in to the equation.  At this point, will proceed with a sleep evaluation consisting of nocturnal polysomnography, as well as multiple sleep latency testing.  I have given her sleep diaries to complete over the next 2 weeks.  I have also asked her to try and avoid sleeping during the day as much as possible leading up to her testing.    Chronic back pain       Imaging: No results found.

## 2012-12-07 NOTE — Assessment & Plan Note (Signed)
She has had several ER visits in [place] with complaints of CP and has been ruled out for MI. She has not had stress test in 8 years. I will have a NM stress test completed for evaluation of ischemia. EKG is normal. I have provided her a Rx for NTG as her Rx is 67 years old.  IF negative stress, I recommend that she be seen by GI for esophageal evaluation. She has had trouble swallowing and has had ulcers in the esophagus in the past. No GI follow up in years.

## 2012-12-08 DIAGNOSIS — F3162 Bipolar disorder, current episode mixed, moderate: Secondary | ICD-10-CM | POA: Diagnosis not present

## 2012-12-20 ENCOUNTER — Encounter (HOSPITAL_COMMUNITY): Payer: Medicare Other

## 2012-12-20 ENCOUNTER — Inpatient Hospital Stay (HOSPITAL_COMMUNITY): Admission: RE | Admit: 2012-12-20 | Payer: Medicare Other | Source: Ambulatory Visit

## 2012-12-20 ENCOUNTER — Telehealth: Payer: Self-pay | Admitting: *Deleted

## 2012-12-20 DIAGNOSIS — R079 Chest pain, unspecified: Secondary | ICD-10-CM

## 2012-12-20 NOTE — Telephone Encounter (Signed)
Test is changed to Lexiscan due to SOB per pt. KL, NP advised. Called pt to advise her that test was changed and that the office will give her a call with the time and date of test.

## 2012-12-20 NOTE — Telephone Encounter (Signed)
No new recommendations

## 2012-12-20 NOTE — Telephone Encounter (Signed)
Spoke to Pt , states that she doesn't think she can walk on treadmill due to SOB. She would like to change test if possible. Pt also stated she forgot to tell you that she had a CT scan of her lungs on 08-19-12 and it showed aorta atherosclerosis.  Please advise.

## 2012-12-20 NOTE — Telephone Encounter (Signed)
Pt miss understood stress test instructions so did not have test done today. She would like to change the type of test that is ordered because she is unable to walk on treadmill due to SOB.

## 2012-12-22 DIAGNOSIS — F3162 Bipolar disorder, current episode mixed, moderate: Secondary | ICD-10-CM | POA: Diagnosis not present

## 2012-12-26 ENCOUNTER — Encounter (HOSPITAL_COMMUNITY)
Admission: RE | Admit: 2012-12-26 | Discharge: 2012-12-26 | Disposition: A | Discharge status: Home / Self Care | Payer: Medicare Other | Source: Ambulatory Visit | Attending: Adult Health | Admitting: Adult Health

## 2012-12-26 ENCOUNTER — Encounter (HOSPITAL_COMMUNITY): Payer: Self-pay

## 2012-12-26 DIAGNOSIS — R079 Chest pain, unspecified: Secondary | ICD-10-CM

## 2012-12-26 DIAGNOSIS — F17200 Nicotine dependence, unspecified, uncomplicated: Secondary | ICD-10-CM | POA: Insufficient documentation

## 2012-12-26 HISTORY — DX: Malignant (primary) neoplasm, unspecified: C80.1

## 2012-12-26 MED ORDER — TECHNETIUM TC 99M SESTAMIBI - CARDIOLITE
30.0000 | Freq: Once | INTRAVENOUS | Status: AC | PRN
  Administered 2012-12-26: 10:00:00 30 via INTRAVENOUS

## 2012-12-26 MED ORDER — SODIUM CHLORIDE 0.9 % IJ SOLN
INTRAMUSCULAR | Status: AC
  Administered 2012-12-26: 10 mL via INTRAVENOUS
  Filled 2012-12-26: qty 10

## 2012-12-26 MED ORDER — TECHNETIUM TC 99M SESTAMIBI - CARDIOLITE
10.0000 | Freq: Once | INTRAVENOUS | Status: AC | PRN
  Administered 2012-12-26: 08:00:00 10 via INTRAVENOUS

## 2012-12-26 MED ORDER — REGADENOSON 0.4 MG/5ML IV SOLN
INTRAVENOUS | Status: AC
  Administered 2012-12-26: 0.4 mg via INTRAVENOUS
  Filled 2012-12-26: qty 5

## 2012-12-26 NOTE — Progress Notes (Signed)
Stress Lab Nurses Notes - Jenna Contreras 12/26/2012 Reason for doing test: Chest Pain Type of test: Marlane Hatcher Nurse performing test: Parke Poisson, RN Nuclear Medicine Tech: Lyndel Pleasure Echo Tech: Not Applicable MD performing test: Purvis Sheffield / Joni Reining NP Family MD: Lilyan Punt Test explained and consent signed: yes IV started: 22g jelco, Saline lock flushed, No redness or edema and Saline lock started in radiology Symptoms: SOB & pressure in head Treatment/Intervention: None Reason test stopped: protocol completed After recovery IV was: Discontinued via X-ray tech and No redness or edema Patient to return to Nuc. Med at :10:30 Patient discharged: Home Patient's Condition upon discharge was: stable Comments: During test BP 114/66 & HR 106 .  Recovery BP 121/71 & HR 87 . Symptoms resolved in recovery. Erskine Speed T

## 2012-12-28 ENCOUNTER — Encounter: Payer: Self-pay | Admitting: Cardiology

## 2012-12-28 ENCOUNTER — Ambulatory Visit (INDEPENDENT_AMBULATORY_CARE_PROVIDER_SITE_OTHER): Payer: Medicare Other | Admitting: Cardiology

## 2012-12-28 VITALS — BP 122/68 | HR 94 | Ht 63.0 in | Wt 144.0 lb

## 2012-12-28 DIAGNOSIS — R072 Precordial pain: Secondary | ICD-10-CM | POA: Diagnosis not present

## 2012-12-28 NOTE — Patient Instructions (Signed)
Your physician recommends that you schedule a follow-up appointment in: PRN 

## 2012-12-28 NOTE — Progress Notes (Signed)
Clinical Summary [name] is a 67 y.o.female that I last saw in the office in July 2013. She had a recent visit with Ms. Lawrence NP this October with complaints of recurring chest pain. She had reportedly been seen at the [facility] ER for evaluation with normal cardiac markers. A followup stress test was scheduled at her last visit.  Lexiscan Cardiolite on 10/14 was reported as normal, no evidence of scar or ischemia, LVEF 89%. I reviewed this with the patient and her husband today, both were reassured. She tells me that she has not had any further chest pain.   Allergies  Allergen Reactions  . Adhesive [Tape]   . Ampicillin   . Betadine [Povidone Iodine]   . Keflex [Cephalexin]   . Latex   . Levaquin [Levofloxacin In D5w]     Painful joints   . Cephalexin Itching and Rash  . Doxycycline Itching and Rash  . Penicillins Itching and Rash  . Tetracyclines & Related Itching and Rash    Current Outpatient Prescriptions  Medication Sig Dispense Refill  . albuterol (PROVENTIL HFA;VENTOLIN HFA) 108 (90 BASE) MCG/ACT inhaler Inhale 1-2 puffs into the lungs every 6 (six) hours as needed for wheezing.  1 Inhaler  0  . cyclobenzaprine (FLEXERIL) 10 MG tablet Take 1 tablet (10 mg total) by mouth 2 (two) times daily as needed for muscle spasms.  180 tablet  0  . diclofenac (VOLTAREN) 75 MG EC tablet Take 1 tablet (75 mg total) by mouth 2 (two) times daily.  180 tablet  1  . escitalopram (LEXAPRO) 10 MG tablet Take 10 mg by mouth daily.      . fentaNYL (DURAGESIC) 50 MCG/HR Place 1 patch (50 mcg total) onto the skin every 3 (three) days.  10 patch  0  . HYDROcodone-acetaminophen (NORCO) 10-325 MG per tablet Take 1 tablet by mouth every 6 (six) hours as needed. pain  120 tablet  2  . lamoTRIgine (LAMICTAL) 100 MG tablet Take 100 mg by mouth 2 (two) times daily.      Marland Kitchen levothyroxine (SYNTHROID) 100 MCG tablet Take 1 tablet (100 mcg total) by mouth daily before breakfast.  90 tablet  1    . LORazepam (ATIVAN) 1 MG tablet Take 1 mg by mouth at bedtime.      . nitroGLYCERIN (NITROSTAT) 0.4 MG SL tablet Place 1 tablet (0.4 mg total) under the tongue every 5 (five) minutes as needed for chest pain.  25 tablet  4  . rizatriptan (MAXALT) 10 MG tablet Take 1 tablet (10 mg total) by mouth once as needed for migraine. May repeat in 2 hours if needed  36 tablet  2  . ondansetron (ZOFRAN) 8 MG tablet Take by mouth every 8 (eight) hours as needed for nausea.       No current facility-administered medications for this visit.    Past Medical History  Diagnosis Date  . Scoliosis   . Migraines   . IBS (irritable bowel syndrome)   . Arthritis   . Hypothyroidism   . Asthma   . Fibromyalgia   . Depression   . OCD (obsessive compulsive disorder)   . Arthritis   . Osteopenia   . Spinal pain   . Cancer     Social History [name] reports that she quit smoking about 32 years ago. Her smoking use included Cigarettes. She has a 38 pack-year smoking history. She has never used smokeless tobacco. Ms. Laible reports that she does not  drink alcohol.  Review of Systems No palpitations, dizziness, syncope. She states that she is limited by significant back pain due to disc problems. Otherwise negative.  Physical Examination Filed Vitals:   12/28/12 1150  BP: 122/68  Pulse: 94   Filed Weights   12/28/12 1150  Weight: 144 lb (65.318 kg)   Appears comfortable at rest. Normally nourished appearing woman, looking younger than stated age.  HEENT: Conjunctiva and lids normal, oropharynx clear.  Neck: Supple, no elevated JVP or obvious carotid bruits, no thyromegaly.  Lungs: Clear to auscultation, nonlabored breathing at rest.  Cardiac: Regular rate and rhythm, no S3 or significant systolic murmur, no pericardial rub.  Extremities: No pitting edema, distal pulses 2+.    Problem List and Plan   Precordial pain Resolved. Recent Lexiscan Cardiolite was normal arguing  against high risk of significant obstructive CAD or myocardial infarction. I recommended continued symptom observation, followup with Dr. Gerda Diss to investigate other possibilities. Our visits can be PRN.    Jonelle Sidle, M.D., F.A.C.C.

## 2012-12-28 NOTE — Assessment & Plan Note (Signed)
Resolved. Recent Lexiscan Cardiolite was normal arguing against high risk of significant obstructive CAD or myocardial infarction. I recommended continued symptom observation, followup with Dr. Gerda Diss to investigate other possibilities. Our visits can be PRN.

## 2012-12-30 ENCOUNTER — Encounter: Payer: Self-pay | Admitting: Family Medicine

## 2013-02-06 ENCOUNTER — Ambulatory Visit: Payer: Medicare Other | Admitting: Family Medicine

## 2013-02-07 DIAGNOSIS — T1510XA Foreign body in conjunctival sac, unspecified eye, initial encounter: Secondary | ICD-10-CM | POA: Diagnosis not present

## 2013-02-07 DIAGNOSIS — H04129 Dry eye syndrome of unspecified lacrimal gland: Secondary | ICD-10-CM | POA: Diagnosis not present

## 2013-02-07 DIAGNOSIS — H2589 Other age-related cataract: Secondary | ICD-10-CM | POA: Diagnosis not present

## 2013-02-07 DIAGNOSIS — H1045 Other chronic allergic conjunctivitis: Secondary | ICD-10-CM | POA: Diagnosis not present

## 2013-02-15 ENCOUNTER — Other Ambulatory Visit: Payer: Self-pay | Admitting: Family Medicine

## 2013-02-15 ENCOUNTER — Encounter: Payer: Self-pay | Admitting: Family Medicine

## 2013-02-15 ENCOUNTER — Ambulatory Visit (HOSPITAL_COMMUNITY)
Admission: RE | Admit: 2013-02-15 | Discharge: 2013-02-15 | Disposition: A | Discharge status: Home / Self Care | Payer: Medicare Other | Source: Ambulatory Visit | Attending: Family Medicine | Admitting: Family Medicine

## 2013-02-15 ENCOUNTER — Ambulatory Visit (INDEPENDENT_AMBULATORY_CARE_PROVIDER_SITE_OTHER): Payer: Medicare Other | Admitting: Family Medicine

## 2013-02-15 VITALS — BP 122/80 | Ht 61.0 in | Wt 150.0 lb

## 2013-02-15 DIAGNOSIS — R5381 Other malaise: Secondary | ICD-10-CM | POA: Diagnosis not present

## 2013-02-15 DIAGNOSIS — M25559 Pain in unspecified hip: Secondary | ICD-10-CM | POA: Insufficient documentation

## 2013-02-15 DIAGNOSIS — M549 Dorsalgia, unspecified: Secondary | ICD-10-CM

## 2013-02-15 DIAGNOSIS — R102 Pelvic and perineal pain: Secondary | ICD-10-CM

## 2013-02-15 DIAGNOSIS — M25551 Pain in right hip: Secondary | ICD-10-CM

## 2013-02-15 DIAGNOSIS — G8929 Other chronic pain: Secondary | ICD-10-CM

## 2013-02-15 DIAGNOSIS — R109 Unspecified abdominal pain: Secondary | ICD-10-CM | POA: Diagnosis not present

## 2013-02-15 DIAGNOSIS — M1611 Unilateral primary osteoarthritis, right hip: Secondary | ICD-10-CM | POA: Insufficient documentation

## 2013-02-15 DIAGNOSIS — Z79899 Other long term (current) drug therapy: Secondary | ICD-10-CM | POA: Diagnosis not present

## 2013-02-15 DIAGNOSIS — M169 Osteoarthritis of hip, unspecified: Secondary | ICD-10-CM | POA: Diagnosis not present

## 2013-02-15 LAB — CBC WITH DIFFERENTIAL/PLATELET
Basophils Absolute: 0.1 10*3/uL (ref 0.0–0.1)
Basophils Relative: 1 % (ref 0–1)
Eosinophils Absolute: 0.3 10*3/uL (ref 0.0–0.7)
HCT: 41.9 % (ref 36.0–46.0)
Lymphs Abs: 4.9 10*3/uL — ABNORMAL HIGH (ref 0.7–4.0)
MCH: 29.3 pg (ref 26.0–34.0)
MCHC: 34.8 g/dL (ref 30.0–36.0)
Monocytes Relative: 5 % (ref 3–12)
Neutrophils Relative %: 38 % — ABNORMAL LOW (ref 43–77)
Platelets: 294 10*3/uL (ref 150–400)
RBC: 4.98 MIL/uL (ref 3.87–5.11)
RDW: 13.9 % (ref 11.5–15.5)
WBC: 9.4 10*3/uL (ref 4.0–10.5)

## 2013-02-15 LAB — BASIC METABOLIC PANEL
CO2: 27 mEq/L (ref 19–32)
Calcium: 9.5 mg/dL (ref 8.4–10.5)
Creat: 0.7 mg/dL (ref 0.50–1.10)
Sodium: 139 mEq/L (ref 135–145)

## 2013-02-15 LAB — HEPATIC FUNCTION PANEL
Alkaline Phosphatase: 124 U/L — ABNORMAL HIGH (ref 39–117)
Bilirubin, Direct: 0.1 mg/dL (ref 0.0–0.3)
Indirect Bilirubin: 0.4 mg/dL (ref 0.0–0.9)
Total Bilirubin: 0.5 mg/dL (ref 0.3–1.2)
Total Protein: 7.3 g/dL (ref 6.0–8.3)

## 2013-02-15 MED ORDER — HYDROCODONE-ACETAMINOPHEN 10-325 MG PO TABS: 1.0000 | ORAL_TABLET | Freq: Four times a day (QID) | ORAL | Status: DC | PRN

## 2013-02-15 MED ORDER — FENTANYL 50 MCG/HR TD PT72: 50.0000 ug | MEDICATED_PATCH | TRANSDERMAL | Status: DC

## 2013-02-15 NOTE — Progress Notes (Signed)
   Subjective:    Patient ID: Jenna Contreras, female    DOB: 25-Jan-1946, 67 y.o.   MRN: [id number]  HPIHurt right hip 4 weeks getting into van. Severe pain getting into the van, pain with walking, present for 4 weeks, hurts with laying down. Walking with a cane.pain down the leg occas down to the foot.no numbness, leg gives way at time. Arthritis in hip. Hx knee surg left side. Hx of lumbar surg. Medication stable  Right knee pain over 1 year. Taking hydrocodone and pain patches. Patient has a long-standing pain history. She's been compliant with her pain treatments but now she started having increased pain and desiring to be on a stronger dose.  abd pain-gained weight due to lithium, stopped it but still enlarging worried aBOYTN tumor No rectal bleeding Patient relates some abdominal and pelvic discomfort. She is worried about cancer. No rectal bleeding she is up-to-date on her colonoscopy. PMH rather complex with scoliosis back pain discrepancy in leg length right knee pain and previous surgeries and chronic pain meds Family history reviewed Review of Systems Denies vomiting diarrhea rectal bleeding denies sweats chills dysuria    Objective:   Physical Exam  Lungs are clear hearts regular pulse normal abdomen soft swollen mildly tender in the midabdomen the lower pelvic region Subjective discomfort in the lower back discomfort into the legs as well     Assessment & Plan:  #1 abdominal pain pelvic pain-ultrasound to rule out ovarian cancer. Also help to ascertain if any signs of ascites with the swelling #2 severe pain right hip x-rays indicated may need orthopedic referral or weight x-rays first #3 chronic pain worsening referral to pain management specialist Prescriptions were given to cover her for the next 3 months.

## 2013-02-16 ENCOUNTER — Encounter: Payer: Self-pay | Admitting: Family Medicine

## 2013-02-16 NOTE — Progress Notes (Signed)
[facility] 12/11 

## 2013-02-21 ENCOUNTER — Ambulatory Visit (HOSPITAL_COMMUNITY)
Admission: RE | Admit: 2013-02-21 | Discharge: 2013-02-21 | Disposition: A | Discharge status: Home / Self Care | Payer: Medicare Other | Source: Ambulatory Visit | Attending: Family Medicine | Admitting: Family Medicine

## 2013-02-21 ENCOUNTER — Ambulatory Visit: Payer: Medicare Other | Admitting: Obstetrics & Gynecology

## 2013-02-21 ENCOUNTER — Other Ambulatory Visit: Payer: Self-pay | Admitting: Family Medicine

## 2013-02-21 ENCOUNTER — Ambulatory Visit (HOSPITAL_COMMUNITY): Payer: Medicare Other

## 2013-02-21 DIAGNOSIS — R109 Unspecified abdominal pain: Secondary | ICD-10-CM | POA: Insufficient documentation

## 2013-02-21 DIAGNOSIS — D259 Leiomyoma of uterus, unspecified: Secondary | ICD-10-CM | POA: Insufficient documentation

## 2013-02-21 DIAGNOSIS — R9389 Abnormal findings on diagnostic imaging of other specified body structures: Secondary | ICD-10-CM | POA: Insufficient documentation

## 2013-02-21 DIAGNOSIS — N949 Unspecified condition associated with female genital organs and menstrual cycle: Secondary | ICD-10-CM | POA: Insufficient documentation

## 2013-02-21 DIAGNOSIS — N289 Disorder of kidney and ureter, unspecified: Secondary | ICD-10-CM | POA: Insufficient documentation

## 2013-02-21 DIAGNOSIS — Z139 Encounter for screening, unspecified: Secondary | ICD-10-CM

## 2013-02-22 ENCOUNTER — Ambulatory Visit: Payer: Medicare Other | Admitting: Family Medicine

## 2013-02-22 DIAGNOSIS — F3162 Bipolar disorder, current episode mixed, moderate: Secondary | ICD-10-CM | POA: Diagnosis not present

## 2013-02-24 ENCOUNTER — Encounter: Payer: Self-pay | Admitting: Family Medicine

## 2013-02-24 ENCOUNTER — Ambulatory Visit (HOSPITAL_COMMUNITY)
Admission: RE | Admit: 2013-02-24 | Discharge: 2013-02-24 | Disposition: A | Discharge status: Home / Self Care | Payer: Medicare Other | Source: Ambulatory Visit | Attending: Family Medicine | Admitting: Family Medicine

## 2013-02-24 ENCOUNTER — Ambulatory Visit (INDEPENDENT_AMBULATORY_CARE_PROVIDER_SITE_OTHER): Payer: Medicare Other | Admitting: Family Medicine

## 2013-02-24 VITALS — BP 124/76 | Ht 61.0 in | Wt 151.2 lb

## 2013-02-24 DIAGNOSIS — M25561 Pain in right knee: Secondary | ICD-10-CM

## 2013-02-24 DIAGNOSIS — Z139 Encounter for screening, unspecified: Secondary | ICD-10-CM

## 2013-02-24 DIAGNOSIS — M25569 Pain in unspecified knee: Secondary | ICD-10-CM | POA: Diagnosis not present

## 2013-02-24 DIAGNOSIS — M79609 Pain in unspecified limb: Secondary | ICD-10-CM | POA: Diagnosis not present

## 2013-02-24 DIAGNOSIS — Z1231 Encounter for screening mammogram for malignant neoplasm of breast: Secondary | ICD-10-CM | POA: Diagnosis not present

## 2013-02-24 DIAGNOSIS — Z23 Encounter for immunization: Secondary | ICD-10-CM | POA: Diagnosis not present

## 2013-02-24 NOTE — Progress Notes (Signed)
   Subjective:    Patient ID: Jenna Contreras, female    DOB: 08-04-1945, 67 y.o.   MRN: [id number]  HPI Patient is here today for a f/u appt from 12/10. She is here to go over the results. She is concerned about what what showed up on the uterus ultrasound we will discuss that in detail today plus also we went over all of her lab work which looked good is set for one liver enzyme being slightly elevated although more than likely this is fatty liver.  She is also experiencing pain in her groin. This is new. It started on 12/8. She did nothing to injure her groin. PMH benign She would also like the flu and pneumo vaccine.     Review of Systems    denies chest tightness pressure pain shortness of breath. Objective:   Physical Exam On physical exam abdomen is soft lungs are clear hearts regular she does have significant tenderness on the back of her thigh as that goes into the inner pubic rami region. Her calves are nonswollen. She states the pain has become progressive over the past week and a half.  The ultrasounds that she had recently were reviewed in detail including the small possible fibroid in the uterus she will be seeing gynecology sent for this. Also liver shows some evidence of fatty liver one liver enzyme is slightly elevated.     Assessment & Plan:  Leg pain - to do U/S, fortunately this ultrasound came back being negative and does not show any signs of a DVT she will followup if ongoing trouble with her leg stretching exercises warm compresses and taking the medication that she R. he has would be the best approach  Liver enzyme- recheck in March, I would recommend repeating liver profile in 3 months if it stays elevated then possibly patient will need further treatment in doing GI consultation/liver testing.  Tuesday Dr Despina Hidden- to review area in the uterus. See above plus

## 2013-02-28 ENCOUNTER — Ambulatory Visit (INDEPENDENT_AMBULATORY_CARE_PROVIDER_SITE_OTHER): Payer: Medicare Other | Admitting: Obstetrics & Gynecology

## 2013-02-28 ENCOUNTER — Encounter: Payer: Self-pay | Admitting: Obstetrics & Gynecology

## 2013-02-28 VITALS — BP 130/70 | Ht 61.0 in | Wt 152.5 lb

## 2013-02-28 DIAGNOSIS — N840 Polyp of corpus uteri: Secondary | ICD-10-CM | POA: Diagnosis not present

## 2013-02-28 NOTE — Progress Notes (Signed)
Patient ID: Jenna Contreras, female   DOB: 1945/06/29, 67 y.o.   MRN: [id number] Pt had sonogram of abdomen and pelvis which reveals probable endometrial polyp, no bleeding Discussed approach to eating and diet and recommend mirlax powder for regularity  All scans reviewed, including images  Past Medical History  Diagnosis Date  . Scoliosis   . Migraines   . IBS (irritable bowel syndrome)   . Arthritis   . Hypothyroidism   . Asthma   . Fibromyalgia   . Depression   . OCD (obsessive compulsive disorder)   . Arthritis   . Osteopenia   . Spinal pain   . Cancer     Past Surgical History  Procedure Laterality Date  . Dilation and curettage of uterus  x3  . Tubal ligation    . Arthroscopic left knee surgery    . Wrist surgery    . Mandible surgery    . Osteotomy      OB History   Grav Para Term Preterm Abortions TAB SAB Ect Mult Living                  Allergies  Allergen Reactions  . Adhesive [Tape]   . Ampicillin   . Betadine [Povidone Iodine]   . Keflex [Cephalexin]   . Latex   . Levaquin [Levofloxacin In D5w]     Painful joints   . Cephalexin Itching and Rash  . Doxycycline Itching and Rash  . Penicillins Itching and Rash  . Tetracyclines & Related Itching and Rash    History   Social History  . Marital Status: Married    Spouse Name: N/A    Number of Children: N/A  . Years of Education: N/A   Occupational History  . Counselor and retired Engineer, civil (consulting)    Social History Main Topics  . Smoking status: Former Smoker -- 2.00 packs/day for 19 years    Types: Cigarettes    Quit date: 03/09/1980  . Smokeless tobacco: Never Used  . Alcohol Use: No     Comment: no  . Drug Use: No  . Sexual Activity: Not Currently    Birth Control/ Protection: Post-menopausal   Other Topics Concern  . None   Social History Narrative  . None    Family History  Problem Relation Age of Onset  . Heart disease Mother   . Diabetes Mother   . Colon cancer Father   .  Heart disease Brother

## 2013-03-09 HISTORY — PX: HIP SURGERY: SHX245

## 2013-03-10 ENCOUNTER — Telehealth: Payer: Self-pay | Admitting: *Deleted

## 2013-03-10 NOTE — Telephone Encounter (Signed)
Discussed with patient. Pt states she has been taking fentyl 50 mcg patches and she is not having any issues with the patch. She would like to see Dr. Brantley Fling instead of Dr. Francesco Runner for pain management. Discussed this with referral coordinator.

## 2013-03-10 NOTE — Telephone Encounter (Signed)
I discussed the case with Marisa Cyphers our nurse. Given that this patient has been using a fentanyl patch without side effects over the past month even though it was inconsistent I believe this patient could go ahead and restart the fentanyl patch. She is supposed to be seeing a pain specialist coming up. Our nurses will call the patient to help verify when was the last time she used the patch. Also will inform her that with the patch she needs to be cautious about not using her oral medications with it for risk of accidentally overdosing. Also importance of being consistent with using the patch will be discussed with her. If somnolence excessive sleepiness or other issues with the patch she is to stop using it. Nurses please call the patient regarding the above information

## 2013-03-10 NOTE — Telephone Encounter (Signed)
[facility] to report pt last filled fentanyl 44mcg patch on oct 17th for a 30 day supply. Pharm called pt when they received request for refill. Pharm stated pt stated she was using left over 69mcg patch and cutting in half and wearing the 61mcg patch longer than 3 days to make it last longer between refills.

## 2013-03-11 NOTE — Telephone Encounter (Signed)
Dr. Niel Hummer does not do medications for pain management. If she does not want to see Dr. Francesco Runner Dr. Hardin Negus in [place] or the pain management doctor with [facility] would be other options. Dr. Eustaquio Maize a does injections only

## 2013-03-14 ENCOUNTER — Other Ambulatory Visit: Payer: Self-pay | Admitting: Family Medicine

## 2013-03-14 DIAGNOSIS — G8929 Other chronic pain: Secondary | ICD-10-CM

## 2013-03-14 DIAGNOSIS — M069 Rheumatoid arthritis, unspecified: Secondary | ICD-10-CM | POA: Diagnosis not present

## 2013-03-14 DIAGNOSIS — H2510 Age-related nuclear cataract, unspecified eye: Secondary | ICD-10-CM | POA: Diagnosis not present

## 2013-03-14 DIAGNOSIS — H04129 Dry eye syndrome of unspecified lacrimal gland: Secondary | ICD-10-CM | POA: Diagnosis not present

## 2013-03-14 DIAGNOSIS — M549 Dorsalgia, unspecified: Principal | ICD-10-CM

## 2013-03-17 ENCOUNTER — Telehealth: Payer: Self-pay | Admitting: *Deleted

## 2013-03-17 ENCOUNTER — Other Ambulatory Visit: Payer: Self-pay | Admitting: *Deleted

## 2013-03-17 NOTE — Telephone Encounter (Signed)
Both are board certified in pain management. We have used them before with good results. I doubt that their offices will do any type of"get to know you visit". Pain management is not an area that has an abundance of physicians. I would trust Dr. Hardin Negus and [facility] pain management. It is often difficult to get patient's an appointment. We will do our best to get her in with who she would like to see but that is not always possible. But she does need the services of a pain management doctor. I need to move forward on this referral.

## 2013-03-17 NOTE — Telephone Encounter (Signed)
Spoke with patient, I told her Dr. Niel Hummer does not do medications for pain management. She does not want to see Dr. Francesco Runner b/c he is not part of [facility]. I told her about  Dr. Hardin Negus in [place] or the pain management doctor with [facility] would be other options. She would like more info on Dr Hardin Negus and the pain management doctor with [facility].

## 2013-03-21 ENCOUNTER — Other Ambulatory Visit: Payer: Self-pay | Admitting: Family Medicine

## 2013-03-21 DIAGNOSIS — M549 Dorsalgia, unspecified: Principal | ICD-10-CM

## 2013-03-21 DIAGNOSIS — G8929 Other chronic pain: Secondary | ICD-10-CM

## 2013-03-21 NOTE — Telephone Encounter (Signed)
Patient stated that she is willing to see either Dr. Hardin Negus or [facility]. She doesn't have a preference. She just wants the referral process started so hopefully she can get in pretty soon to see someone. She is aware that it could take a while to be seen also.

## 2013-03-21 NOTE — Telephone Encounter (Signed)
Patient referred to Dr. Hardin Negus, Izora Ribas will be working on referral.

## 2013-04-19 ENCOUNTER — Telehealth: Payer: Self-pay | Admitting: Internal Medicine

## 2013-04-19 DIAGNOSIS — M25559 Pain in unspecified hip: Secondary | ICD-10-CM | POA: Diagnosis not present

## 2013-04-19 DIAGNOSIS — M1610 Unilateral primary osteoarthritis, unspecified hip: Secondary | ICD-10-CM | POA: Diagnosis not present

## 2013-04-19 DIAGNOSIS — M1710 Unilateral primary osteoarthritis, unspecified knee: Secondary | ICD-10-CM | POA: Diagnosis not present

## 2013-04-19 NOTE — Telephone Encounter (Signed)
lmomtcb x1 

## 2013-04-20 ENCOUNTER — Telehealth: Payer: Self-pay | Admitting: Family Medicine

## 2013-04-20 NOTE — Telephone Encounter (Signed)
Pt states that Dr. Alvan Dame (ortho) wanted her to let you know that she will soon be getting a hip replacement, once recovered will be getting bilat knee replacements, Dr. Alvan Dame recommends pt stay on her current pain medicine, (her pain management appt with Dr. Letta Pate is pending) Pt is scheduled to be seen here 05/22/13, told her to call to reschedule if she's recovering from hip replacement at that time  Also pt wanted to let you know that since you told her that she has a fatty liver, she's researched online and is following a health diet and has currently lost 8 lbs  Pt may need a surgical clearance appointment, I told her to find out from Dr. Alvan Dame and to call us as soon as she knows due to appointments filling up quickly

## 2013-04-21 NOTE — Telephone Encounter (Signed)
LMTCBx2, pt needs an appt. Pass Jenna Contreras, CMA

## 2013-04-24 NOTE — Telephone Encounter (Signed)
LMTCBx3, pt needs an appt. [place] Bing, CMA

## 2013-04-28 ENCOUNTER — Telehealth: Payer: Self-pay | Admitting: Internal Medicine

## 2013-04-28 NOTE — Telephone Encounter (Signed)
She is hvinag knee surgery 05/09/13. If asthma is stable, low risk but not seen since June 2014. Can she come in next few days to counsel on any concerns she might have?  Dr. Brand Males, M.D., [facility].C.P Pulmonary and Critical Care Medicine Staff Physician [facility] Pager: [phone], If no answer or between  15:00h - 7:00h: call [phone]  04/28/2013 1:46 PM

## 2013-04-28 NOTE — Telephone Encounter (Signed)
Pt called back and appt has been scheduled for her to see MR on 2/27

## 2013-04-28 NOTE — Telephone Encounter (Signed)
lmtcb x1 

## 2013-05-01 NOTE — Telephone Encounter (Signed)
Duplicate message. Pt has an appt to discuss.Jenna Contreras, CMA

## 2013-05-02 ENCOUNTER — Telehealth: Payer: Self-pay | Admitting: Internal Medicine

## 2013-05-02 NOTE — Telephone Encounter (Signed)
Unsure of where Anderson Malta keeps these forms.  Will forward to her to look into.

## 2013-05-03 ENCOUNTER — Ambulatory Visit (INDEPENDENT_AMBULATORY_CARE_PROVIDER_SITE_OTHER): Payer: Medicare Other | Admitting: Family Medicine

## 2013-05-03 ENCOUNTER — Encounter (HOSPITAL_COMMUNITY): Payer: Self-pay | Admitting: Pharmacy Technician

## 2013-05-03 ENCOUNTER — Encounter: Payer: Self-pay | Admitting: Family Medicine

## 2013-05-03 VITALS — BP 102/62 | Ht 61.0 in | Wt 151.0 lb

## 2013-05-03 DIAGNOSIS — M1610 Unilateral primary osteoarthritis, unspecified hip: Secondary | ICD-10-CM

## 2013-05-03 DIAGNOSIS — M169 Osteoarthritis of hip, unspecified: Secondary | ICD-10-CM | POA: Diagnosis not present

## 2013-05-03 DIAGNOSIS — M1611 Unilateral primary osteoarthritis, right hip: Secondary | ICD-10-CM

## 2013-05-03 NOTE — Telephone Encounter (Signed)
I have the form and as stated in previous phone note pt has an appt on 2-27 to discuss clearance. [place] Bing, CMA

## 2013-05-03 NOTE — Progress Notes (Signed)
   Subjective:    Patient ID: Jenna Contreras, female    DOB: September 12, 1945, 68 y.o.   MRN: [id number]   HPI Patient arrives to discuss upcoming surgery. She has appt. with pulmonology on Friday and has preop appt tomm. This patient has chronic pain with her back she has severe scoliosis. She also has chronic pain in the hips and her knees. She is on pain patch currently along with oxycodone. She hopes to get to the point where she can reduce the amount of pain medicine she is taking.  She has upcoming surgery with her hip she is looking forward to getting this completed. She denies any chest tightness pressure pain shortness breath she does not get short of breath with mild activity she is able to do the equivalent of 4 METS she has never had any cardiac problems in the past. She denies any swelling in the legs PND orthopnea. Denies rectal bleeding problems Her medications were reviewed in detail. She does take some anti-inflammatories to help with her discomfort. She also takes hydrocodone when necessary but also uses a pain patch on a regular basis. She does have a history of reactive airway disease-mild asthma she does not typically have flareups. She does have significant chronic back pain and scoliosis She does have hypothyroidism for which she takes medicines have been under good control She also has some mental health issues for which she sees a specialist. Also history of migraines occasional use of medicines. She does take lorazepam at nighttime to help her sleep.   Review of Systems See above.    Objective:   Physical Exam Neck no masses lungs are clear no crackles heart is regular. Pulses normal abdomen soft extremities no edema       Assessment & Plan:  #1 she is in a low risk cardiac profile there is no apparent need to have her go through any cardiac testing currently #2 she is approved for surgery #3 she was told to stay away from all anti-inflammatories at least 7  days but prior to surgery #4 she was told to discuss with preoperative evaluation/anesthesiology her pain patch in my opinion she should not have the pain patch on when she has surgery this should be managed via medications given by the specialist. #5 patient did undergo a nuclear stress test back in October 2014 which was totally normal #6 she was instructed to discuss with orthopedics DVT prophylaxis because of increased risk of DVTs associated with surgery

## 2013-05-04 ENCOUNTER — Other Ambulatory Visit (HOSPITAL_COMMUNITY): Payer: Self-pay | Admitting: Orthopedic Surgery

## 2013-05-04 NOTE — Progress Notes (Signed)
ekg 12-07-12 epic lov dr [name] 12-28-12 epic Chest ct abnormal epic 07-25-12 epic, will do chest xray with pre op 05-05-13

## 2013-05-05 ENCOUNTER — Encounter (HOSPITAL_COMMUNITY)
Admission: RE | Admit: 2013-05-05 | Discharge: 2013-05-05 | Disposition: A | Discharge status: Home / Self Care | Payer: Medicare Other | Source: Ambulatory Visit

## 2013-05-05 ENCOUNTER — Encounter: Payer: Self-pay | Admitting: Internal Medicine

## 2013-05-05 ENCOUNTER — Ambulatory Visit (INDEPENDENT_AMBULATORY_CARE_PROVIDER_SITE_OTHER): Payer: Medicare Other | Admitting: Internal Medicine

## 2013-05-05 ENCOUNTER — Ambulatory Visit (HOSPITAL_COMMUNITY)
Admission: RE | Admit: 2013-05-05 | Discharge: 2013-05-05 | Disposition: A | Discharge status: Home / Self Care | Payer: Medicare Other | Source: Ambulatory Visit | Attending: Orthopedic Surgery | Admitting: Orthopedic Surgery

## 2013-05-05 ENCOUNTER — Encounter (HOSPITAL_COMMUNITY): Payer: Self-pay

## 2013-05-05 VITALS — BP 118/72 | HR 79 | Ht 61.0 in | Wt 136.0 lb

## 2013-05-05 DIAGNOSIS — R0982 Postnasal drip: Secondary | ICD-10-CM

## 2013-05-05 DIAGNOSIS — J45909 Unspecified asthma, uncomplicated: Secondary | ICD-10-CM

## 2013-05-05 DIAGNOSIS — Z01811 Encounter for preprocedural respiratory examination: Secondary | ICD-10-CM | POA: Diagnosis not present

## 2013-05-05 DIAGNOSIS — J329 Chronic sinusitis, unspecified: Secondary | ICD-10-CM | POA: Diagnosis not present

## 2013-05-05 DIAGNOSIS — Z01812 Encounter for preprocedural laboratory examination: Secondary | ICD-10-CM | POA: Diagnosis not present

## 2013-05-05 DIAGNOSIS — Z01818 Encounter for other preprocedural examination: Secondary | ICD-10-CM | POA: Insufficient documentation

## 2013-05-05 DIAGNOSIS — J4530 Mild persistent asthma, uncomplicated: Secondary | ICD-10-CM

## 2013-05-05 HISTORY — DX: Age-related osteoporosis without current pathological fracture: M81.0

## 2013-05-05 HISTORY — DX: Personal history of other malignant neoplasm of skin: Z85.828

## 2013-05-05 HISTORY — DX: Psoriasis, unspecified: L40.9

## 2013-05-05 HISTORY — DX: Gastro-esophageal reflux disease without esophagitis: K21.9

## 2013-05-05 HISTORY — DX: Anxiety disorder, unspecified: F41.9

## 2013-05-05 HISTORY — DX: Constipation, unspecified: K59.00

## 2013-05-05 HISTORY — DX: Bipolar II disorder: F31.81

## 2013-05-05 LAB — BASIC METABOLIC PANEL
BUN: 20 mg/dL (ref 6–23)
CHLORIDE: 98 meq/L (ref 96–112)
CO2: 27 mEq/L (ref 19–32)
Calcium: 9.5 mg/dL (ref 8.4–10.5)
Creatinine, Ser: 0.79 mg/dL (ref 0.50–1.10)
GFR, EST NON AFRICAN AMERICAN: 83 mL/min — AB (ref >90)
Glucose, Bld: 91 mg/dL (ref 70–99)
Potassium: 4.4 mEq/L (ref 3.7–5.3)
SODIUM: 136 meq/L — AB (ref 137–147)

## 2013-05-05 LAB — CBC
HEMATOCRIT: 41.6 % (ref 36.0–46.0)
Hemoglobin: 13.9 g/dL (ref 12.0–15.0)
MCH: 29 pg (ref 26.0–34.0)
MCHC: 33.4 g/dL (ref 30.0–36.0)
MCV: 86.7 fL (ref 78.0–100.0)
Platelets: 269 10*3/uL (ref 150–400)
RBC: 4.8 MIL/uL (ref 3.87–5.11)
RDW: 13.7 % (ref 11.5–15.5)
WBC: 8.5 10*3/uL (ref 4.0–10.5)

## 2013-05-05 LAB — SURGICAL PCR SCREEN
MRSA, PCR: NEGATIVE
Staphylococcus aureus: NEGATIVE

## 2013-05-05 LAB — ABO/RH: ABO/RH(D): AB POS

## 2013-05-05 LAB — URINALYSIS, ROUTINE W REFLEX MICROSCOPIC
Bilirubin Urine: NEGATIVE
Glucose, UA: NEGATIVE mg/dL
Hgb urine dipstick: NEGATIVE
KETONES UR: NEGATIVE mg/dL
Leukocytes, UA: NEGATIVE
NITRITE: NEGATIVE
Protein, ur: NEGATIVE mg/dL
Specific Gravity, Urine: 1.014 (ref 1.005–1.030)
UROBILINOGEN UA: 0.2 mg/dL (ref 0.0–1.0)
pH: 5.5 (ref 5.0–8.0)

## 2013-05-05 LAB — PROTIME-INR
INR: 0.91 (ref 0.00–1.49)
Prothrombin Time: 12.1 seconds (ref 11.6–15.2)

## 2013-05-05 LAB — APTT: aPTT: 30 seconds (ref 24–37)

## 2013-05-05 MED ORDER — FLUTICASONE PROPIONATE 50 MCG/ACT NA SUSP: 2.0000 | Freq: Every day | NASAL | Status: DC

## 2013-05-05 NOTE — Patient Instructions (Addendum)
#  preop pulmonary eval prior to right hip surgery  - low risk for pulmonary complications that include pneumonia, asthma flare up, but at moderate risk for blood clot - Do not do surgery on  the day of surgery if patient has an asthma exacerbation  - recommend duoneb q6h + pulmicort neb  q12h post op + albuterol prn  -recommend dvt prevention per orthopedic protocol  #ASthma  = well controlled  - take albuterol prn  #SInus drainaing  take generic fluticasone inhaler 2 squirts each nostril daily Do netti pot or saline nasal spray at night  #Followup 9 months or sooner if needed

## 2013-05-05 NOTE — Patient Instructions (Addendum)
Reene Britton-Watkins  05/05/2013                           YOUR PROCEDURE IS SCHEDULED ON: 05/09/13               PLEASE REPORT TO SHORT STAY CENTER AT : 5:15AM.               CALL THIS NUMBER IF ANY PROBLEMS THE DAY OF SURGERY :               [phone]                   REMEMBER:   Do not eat food or drink liquids AFTER MIDNIGHT             Take these medicines the morning of surgery with A SIP OF WATER:  LEVOTHYROXINE / HYDROCODONE IF NEEDED   Do not wear jewelry, make-up   Do not wear lotions, powders, or perfumes.   Do not shave legs or underarms 12 hrs. before surgery (men may shave face)  Do not bring valuables to the hospital.  Contacts, dentures or bridgework may not be worn into surgery.  Leave suitcase in the car. After surgery it may be brought to your room.  For patients admitted to the hospital more than one night, checkout time is 11:00 AM                                                       The day of discharge.   Patients discharged the day of surgery will not be allowed to drive home.                If going home same day of surgery, must have someone stay with you            FIRST 24 hrs at home and arrange for some one to drive you home from hospital.    Special Instructions             Please read over the following fact sheets that you were given:               1. [place]                2. INCENTIVE SPIROMETER                3. MRSA INFORMATION SHEET                                                X_____________________________________________________________________        Failure to follow these instructions may result in cancellation of your surgery

## 2013-05-05 NOTE — Progress Notes (Signed)
Subjective:    Patient ID: Jenna Contreras, female    DOB: 03-23-1945, 68 y.o.   MRN: [id number]  HPI Patient with mild persistent asthma  She's having right hip surgery and then later on in several months we'll have bilateral knee surgery. Surgery is being done by Dr. Paralee Cancel of orthopedics. She wants a preoperative pulmonary evaluation. Currently her asthma is well controlled. She only uses her albuterol 2 times a year. No nocturnal awakenings. Has occasional wheeze which resolves by deep inhalation. She does have associated chronic sinus drainage for which she does not take any treatment. This is actually significant and sometimes she feels she cannot swallow. Denies any acid reflux. Currently as was not an exacerbation.  In terms of preoperative evaluation: This a short surgery. The thorax of the abdomen we'll not be incised. The nutritional status and renal status a good at least per history. As was well-controlled. She is functional. All evidence points to a low risk for pulmonary complications except for deep vein thrombosis where risk is higher but orthopedics has a protocol for prophylaxis    has a past medical history of Scoliosis; Migraines; IBS (irritable bowel syndrome); Arthritis; Hypothyroidism; Asthma; Fibromyalgia; Depression; OCD (obsessive compulsive disorder); Arthritis; Osteopenia; Spinal pain; and Cancer.   has past surgical history that includes Dilation and curettage of uterus (x3); Tubal ligation; Arthroscopic left knee surgery; Wrist surgery; Mandible surgery; and Osteotomy.    Review of Systems  Constitutional: Negative for fever and unexpected weight change.  HENT: Negative for congestion, dental problem, ear pain, nosebleeds, postnasal drip, rhinorrhea, sinus pressure, sneezing, sore throat and trouble swallowing.   Eyes: Negative for redness and itching.  Respiratory: Negative for cough, chest tightness, shortness of breath and wheezing.    Cardiovascular: Negative for palpitations and leg swelling.  Gastrointestinal: Negative for nausea and vomiting.  Genitourinary: Negative for dysuria.  Musculoskeletal: Negative for joint swelling.  Skin: Negative for rash.  Neurological: Negative for headaches.  Hematological: Does not bruise/bleed easily.  Psychiatric/Behavioral: Negative for dysphoric mood. The patient is not nervous/anxious.        Objective:   Physical Exam  Vitals reviewed. Constitutional: She is oriented to person, place, and time. She appears well-developed and well-nourished. No distress.  HENT:  Head: Normocephalic and atraumatic.  Right Ear: External ear normal.  Left Ear: External ear normal.  Mouth/Throat: Oropharynx is clear and moist. No oropharyngeal exudate.  Sinus drip +   Eyes: Conjunctivae and EOM are normal. Pupils are equal, round, and reactive to light. Right eye exhibits no discharge. Left eye exhibits no discharge. No scleral icterus.  Neck: Normal range of motion. Neck supple. No JVD present. No tracheal deviation present. No thyromegaly present.  Cardiovascular: Normal rate, regular rhythm, normal heart sounds and intact distal pulses.  Exam reveals no gallop and no friction rub.   No murmur heard. Pulmonary/Chest: Effort normal and breath sounds normal. No respiratory distress. She has no wheezes. She has no rales. She exhibits no tenderness.  Abdominal: Soft. Bowel sounds are normal. She exhibits no distension and no mass. There is no tenderness. There is no rebound and no guarding.  Musculoskeletal: Normal range of motion. She exhibits no edema and no tenderness.  Using cane  Lymphadenopathy:    She has no cervical adenopathy.  Neurological: She is alert and oriented to person, place, and time. She has normal reflexes. No cranial nerve deficit. She exhibits normal muscle tone. Coordination normal.  Skin: Skin is warm and dry. No  rash noted. She is not diaphoretic. No erythema. No  pallor.  Psychiatric: She has a normal mood and affect. Her behavior is normal. Judgment and thought content normal.          Assessment & Plan:
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
## 2013-05-07 DIAGNOSIS — Z01811 Encounter for preprocedural respiratory examination: Secondary | ICD-10-CM | POA: Insufficient documentation

## 2013-05-07 DIAGNOSIS — R52 Pain, unspecified: Secondary | ICD-10-CM | POA: Insufficient documentation

## 2013-05-07 DIAGNOSIS — R0982 Postnasal drip: Secondary | ICD-10-CM | POA: Insufficient documentation

## 2013-05-07 NOTE — Assessment & Plan Note (Signed)
n  #SInus drainaing  take generic fluticasone inhaler 2 squirts each nostril daily Do netti pot or saline nasal spray at night

## 2013-05-07 NOTE — Assessment & Plan Note (Signed)
#  preop pulmonary eval prior to right hip surgery  - low risk for pulmonary complications that include pneumonia, asthma flare up, but at moderate risk for blood clot - Do not do surgery on  the day of surgery if patient has an asthma exacerbation  - recommend duoneb q6h + pulmicort neb  q12h post op + albuterol prn  -recommend dvt prevention per orthopedic protocol

## 2013-05-07 NOTE — Assessment & Plan Note (Signed)
#  ASthma  = well controlled  - take albuterol prn   #Followup 9 months or sooner if needed

## 2013-05-09 ENCOUNTER — Inpatient Hospital Stay (HOSPITAL_COMMUNITY): Payer: Medicare Other

## 2013-05-09 ENCOUNTER — Encounter (HOSPITAL_COMMUNITY): Payer: Self-pay | Admitting: *Deleted

## 2013-05-09 ENCOUNTER — Inpatient Hospital Stay (HOSPITAL_COMMUNITY): Payer: Medicare Other | Admitting: Anesthesiology

## 2013-05-09 ENCOUNTER — Inpatient Hospital Stay (HOSPITAL_COMMUNITY)
Admission: RE | Admit: 2013-05-09 | Discharge: 2013-05-11 | DRG: 470 | Disposition: A | Discharge status: Home Health | Payer: Medicare Other | Source: Ambulatory Visit | Attending: Orthopedic Surgery | Admitting: Orthopedic Surgery

## 2013-05-09 ENCOUNTER — Encounter (HOSPITAL_COMMUNITY)
Admission: RE | Disposition: A | Discharge status: Home Health | Payer: Self-pay | Source: Ambulatory Visit | Attending: Orthopedic Surgery

## 2013-05-09 ENCOUNTER — Encounter (HOSPITAL_COMMUNITY): Payer: Medicare Other | Admitting: Anesthesiology

## 2013-05-09 DIAGNOSIS — Z471 Aftercare following joint replacement surgery: Secondary | ICD-10-CM | POA: Diagnosis not present

## 2013-05-09 DIAGNOSIS — Z6827 Body mass index (BMI) 27.0-27.9, adult: Secondary | ICD-10-CM

## 2013-05-09 DIAGNOSIS — M810 Age-related osteoporosis without current pathological fracture: Secondary | ICD-10-CM | POA: Diagnosis present

## 2013-05-09 DIAGNOSIS — Z800 Family history of malignant neoplasm of digestive organs: Secondary | ICD-10-CM | POA: Diagnosis not present

## 2013-05-09 DIAGNOSIS — E039 Hypothyroidism, unspecified: Secondary | ICD-10-CM | POA: Diagnosis present

## 2013-05-09 DIAGNOSIS — J45909 Unspecified asthma, uncomplicated: Secondary | ICD-10-CM | POA: Diagnosis present

## 2013-05-09 DIAGNOSIS — Z888 Allergy status to other drugs, medicaments and biological substances status: Secondary | ICD-10-CM

## 2013-05-09 DIAGNOSIS — Z833 Family history of diabetes mellitus: Secondary | ICD-10-CM | POA: Diagnosis not present

## 2013-05-09 DIAGNOSIS — M169 Osteoarthritis of hip, unspecified: Secondary | ICD-10-CM | POA: Diagnosis not present

## 2013-05-09 DIAGNOSIS — L408 Other psoriasis: Secondary | ICD-10-CM | POA: Diagnosis present

## 2013-05-09 DIAGNOSIS — G43909 Migraine, unspecified, not intractable, without status migrainosus: Secondary | ICD-10-CM | POA: Diagnosis present

## 2013-05-09 DIAGNOSIS — Z8249 Family history of ischemic heart disease and other diseases of the circulatory system: Secondary | ICD-10-CM

## 2013-05-09 DIAGNOSIS — Z85828 Personal history of other malignant neoplasm of skin: Secondary | ICD-10-CM

## 2013-05-09 DIAGNOSIS — Z8673 Personal history of transient ischemic attack (TIA), and cerebral infarction without residual deficits: Secondary | ICD-10-CM | POA: Diagnosis not present

## 2013-05-09 DIAGNOSIS — K219 Gastro-esophageal reflux disease without esophagitis: Secondary | ICD-10-CM | POA: Diagnosis not present

## 2013-05-09 DIAGNOSIS — Z96649 Presence of unspecified artificial hip joint: Secondary | ICD-10-CM | POA: Diagnosis not present

## 2013-05-09 DIAGNOSIS — D62 Acute posthemorrhagic anemia: Secondary | ICD-10-CM | POA: Diagnosis not present

## 2013-05-09 DIAGNOSIS — Z87891 Personal history of nicotine dependence: Secondary | ICD-10-CM | POA: Diagnosis not present

## 2013-05-09 DIAGNOSIS — IMO0001 Reserved for inherently not codable concepts without codable children: Secondary | ICD-10-CM | POA: Diagnosis not present

## 2013-05-09 DIAGNOSIS — D500 Iron deficiency anemia secondary to blood loss (chronic): Secondary | ICD-10-CM | POA: Diagnosis not present

## 2013-05-09 DIAGNOSIS — Z880 Allergy status to penicillin: Secondary | ICD-10-CM | POA: Diagnosis not present

## 2013-05-09 DIAGNOSIS — M259 Joint disorder, unspecified: Secondary | ICD-10-CM | POA: Diagnosis not present

## 2013-05-09 DIAGNOSIS — F411 Generalized anxiety disorder: Secondary | ICD-10-CM | POA: Diagnosis present

## 2013-05-09 DIAGNOSIS — F429 Obsessive-compulsive disorder, unspecified: Secondary | ICD-10-CM | POA: Diagnosis present

## 2013-05-09 DIAGNOSIS — M1610 Unilateral primary osteoarthritis, unspecified hip: Principal | ICD-10-CM | POA: Diagnosis present

## 2013-05-09 DIAGNOSIS — F3189 Other bipolar disorder: Secondary | ICD-10-CM | POA: Diagnosis not present

## 2013-05-09 DIAGNOSIS — E663 Overweight: Secondary | ICD-10-CM | POA: Diagnosis present

## 2013-05-09 HISTORY — PX: TOTAL HIP ARTHROPLASTY: SHX124

## 2013-05-09 LAB — TYPE AND SCREEN
ABO/RH(D): AB POS
Antibody Screen: NEGATIVE

## 2013-05-09 SURGERY — ARTHROPLASTY, HIP, TOTAL, ANTERIOR APPROACH
Anesthesia: Spinal | Site: Hip | Laterality: Right

## 2013-05-09 MED ORDER — STERILE WATER FOR IRRIGATION IR SOLN
Status: DC | PRN
  Administered 2013-05-09: 1500 mL

## 2013-05-09 MED ORDER — BUPIVACAINE HCL (PF) 0.5 % IJ SOLN
INTRAMUSCULAR | Status: AC
  Filled 2013-05-09: qty 30

## 2013-05-09 MED ORDER — MIDAZOLAM HCL 5 MG/5ML IJ SOLN
INTRAMUSCULAR | Status: DC | PRN
  Administered 2013-05-09 (×2): 1 mg via INTRAVENOUS

## 2013-05-09 MED ORDER — CLINDAMYCIN PHOSPHATE 900 MG/50ML IV SOLN
INTRAVENOUS | Status: AC
  Filled 2013-05-09: qty 50

## 2013-05-09 MED ORDER — ESCITALOPRAM OXALATE 10 MG PO TABS
10.0000 mg | ORAL_TABLET | Freq: Every morning | ORAL | Status: DC
  Administered 2013-05-10 – 2013-05-11 (×2): 10 mg via ORAL
  Filled 2013-05-09 (×2): qty 1

## 2013-05-09 MED ORDER — ALUM & MAG HYDROXIDE-SIMETH 200-200-20 MG/5ML PO SUSP: 30.0000 mL | ORAL | Status: DC | PRN

## 2013-05-09 MED ORDER — ONDANSETRON HCL 4 MG PO TABS: 4.0000 mg | ORAL_TABLET | Freq: Four times a day (QID) | ORAL | Status: DC | PRN

## 2013-05-09 MED ORDER — PROPOFOL 10 MG/ML IV BOLUS
INTRAVENOUS | Status: AC
  Filled 2013-05-09: qty 20

## 2013-05-09 MED ORDER — FERROUS SULFATE 325 (65 FE) MG PO TABS
325.0000 mg | ORAL_TABLET | Freq: Three times a day (TID) | ORAL | Status: DC
  Administered 2013-05-10 – 2013-05-11 (×4): 325 mg via ORAL
  Filled 2013-05-09 (×8): qty 1

## 2013-05-09 MED ORDER — PROPOFOL 10 MG/ML IV BOLUS
INTRAVENOUS | Status: DC | PRN
  Administered 2013-05-09: 20 mg via INTRAVENOUS

## 2013-05-09 MED ORDER — KETOROLAC TROMETHAMINE 15 MG/ML IJ SOLN
15.0000 mg | Freq: Four times a day (QID) | INTRAMUSCULAR | Status: DC
  Administered 2013-05-09 – 2013-05-10 (×4): 15 mg via INTRAVENOUS
  Filled 2013-05-09 (×7): qty 1

## 2013-05-09 MED ORDER — CHLORHEXIDINE GLUCONATE 4 % EX LIQD: 60.0000 mL | Freq: Once | CUTANEOUS | Status: DC

## 2013-05-09 MED ORDER — PROPOFOL INFUSION 10 MG/ML OPTIME
INTRAVENOUS | Status: DC | PRN
  Administered 2013-05-09: 75 ug/kg/min via INTRAVENOUS

## 2013-05-09 MED ORDER — PHENYLEPHRINE 40 MCG/ML (10ML) SYRINGE FOR IV PUSH (FOR BLOOD PRESSURE SUPPORT)
PREFILLED_SYRINGE | INTRAVENOUS | Status: AC
  Filled 2013-05-09: qty 10

## 2013-05-09 MED ORDER — FLUTICASONE PROPIONATE 50 MCG/ACT NA SUSP
2.0000 | Freq: Every day | NASAL | Status: DC
  Administered 2013-05-10: 2 via NASAL
  Filled 2013-05-09: qty 16

## 2013-05-09 MED ORDER — CLINDAMYCIN PHOSPHATE 900 MG/50ML IV SOLN
900.0000 mg | INTRAVENOUS | Status: AC
  Administered 2013-05-09: 900 mg via INTRAVENOUS
  Filled 2013-05-09: qty 50

## 2013-05-09 MED ORDER — MIDAZOLAM HCL 2 MG/2ML IJ SOLN
INTRAMUSCULAR | Status: AC
  Filled 2013-05-09: qty 2

## 2013-05-09 MED ORDER — PHENOL 1.4 % MT LIQD
1.0000 | OROMUCOSAL | Status: DC | PRN
  Filled 2013-05-09: qty 177

## 2013-05-09 MED ORDER — SODIUM CHLORIDE 0.9 % IJ SOLN
INTRAMUSCULAR | Status: AC
  Filled 2013-05-09: qty 10

## 2013-05-09 MED ORDER — ACETAMINOPHEN 650 MG RE SUPP: 650.0000 mg | Freq: Four times a day (QID) | RECTAL | Status: DC | PRN

## 2013-05-09 MED ORDER — POLYETHYLENE GLYCOL 3350 17 G PO PACK: 17.0000 g | PACK | Freq: Every day | ORAL | Status: DC | PRN

## 2013-05-09 MED ORDER — RIVAROXABAN 10 MG PO TABS
10.0000 mg | ORAL_TABLET | Freq: Every day | ORAL | Status: DC
  Administered 2013-05-10 – 2013-05-11 (×2): 10 mg via ORAL
  Filled 2013-05-09 (×3): qty 1

## 2013-05-09 MED ORDER — LORAZEPAM 1 MG PO TABS
1.0000 mg | ORAL_TABLET | Freq: Every day | ORAL | Status: DC
  Filled 2013-05-09 (×2): qty 1

## 2013-05-09 MED ORDER — HYDROMORPHONE HCL PF 1 MG/ML IJ SOLN
0.5000 mg | INTRAMUSCULAR | Status: DC | PRN
  Administered 2013-05-09 (×2): 0.5 mg via INTRAVENOUS
  Filled 2013-05-09 (×2): qty 1

## 2013-05-09 MED ORDER — SODIUM CHLORIDE 0.9 % IV SOLN
INTRAVENOUS | Status: DC
  Administered 2013-05-09 (×2): via INTRAVENOUS
  Filled 2013-05-09 (×8): qty 1000

## 2013-05-09 MED ORDER — 0.9 % SODIUM CHLORIDE (POUR BTL) OPTIME
TOPICAL | Status: DC | PRN
  Administered 2013-05-09: 1000 mL

## 2013-05-09 MED ORDER — ONDANSETRON HCL 4 MG/2ML IJ SOLN
INTRAMUSCULAR | Status: AC
  Filled 2013-05-09: qty 2

## 2013-05-09 MED ORDER — LAMOTRIGINE 25 MG PO TABS
50.0000 mg | ORAL_TABLET | Freq: Every morning | ORAL | Status: DC
  Administered 2013-05-10 – 2013-05-11 (×2): 50 mg via ORAL
  Filled 2013-05-09 (×3): qty 2

## 2013-05-09 MED ORDER — PROMETHAZINE HCL 25 MG/ML IJ SOLN: 6.2500 mg | INTRAMUSCULAR | Status: DC | PRN

## 2013-05-09 MED ORDER — DIPHENHYDRAMINE HCL 12.5 MG/5ML PO ELIX: 25.0000 mg | ORAL_SOLUTION | Freq: Four times a day (QID) | ORAL | Status: DC | PRN

## 2013-05-09 MED ORDER — SENNA 8.6 MG PO TABS
1.0000 | ORAL_TABLET | Freq: Two times a day (BID) | ORAL | Status: DC
  Administered 2013-05-10 – 2013-05-11 (×2): 8.6 mg via ORAL

## 2013-05-09 MED ORDER — LORAZEPAM 1 MG PO TABS
1.0000 mg | ORAL_TABLET | Freq: Four times a day (QID) | ORAL | Status: DC | PRN
  Administered 2013-05-09: 1 mg via ORAL
  Administered 2013-05-10: 0.5 mg via ORAL
  Administered 2013-05-10: 1 mg via ORAL
  Filled 2013-05-09 (×2): qty 1

## 2013-05-09 MED ORDER — FENTANYL 50 MCG/HR TD PT72
75.0000 ug | MEDICATED_PATCH | TRANSDERMAL | Status: DC
  Administered 2013-05-09: 75 ug via TRANSDERMAL
  Filled 2013-05-09 (×2): qty 1

## 2013-05-09 MED ORDER — EPHEDRINE SULFATE 50 MG/ML IJ SOLN
INTRAMUSCULAR | Status: DC | PRN
  Administered 2013-05-09 (×4): 10 mg via INTRAVENOUS

## 2013-05-09 MED ORDER — CLINDAMYCIN PHOSPHATE 600 MG/50ML IV SOLN
600.0000 mg | Freq: Four times a day (QID) | INTRAVENOUS | Status: AC
  Administered 2013-05-09 (×2): 600 mg via INTRAVENOUS
  Filled 2013-05-09 (×2): qty 50

## 2013-05-09 MED ORDER — FENTANYL CITRATE 0.05 MG/ML IJ SOLN: 25.0000 ug | INTRAMUSCULAR | Status: DC | PRN

## 2013-05-09 MED ORDER — LACTATED RINGERS IV SOLN: INTRAVENOUS | Status: DC

## 2013-05-09 MED ORDER — FENTANYL CITRATE 0.05 MG/ML IJ SOLN
INTRAMUSCULAR | Status: AC
Start: 2013-05-09 — End: 2013-05-09
  Filled 2013-05-09: qty 5

## 2013-05-09 MED ORDER — HYDROMORPHONE HCL PF 1 MG/ML IJ SOLN
0.5000 mg | INTRAMUSCULAR | Status: DC | PRN
  Administered 2013-05-09 (×3): 1 mg via INTRAVENOUS
  Administered 2013-05-10: 0.5 mg via INTRAVENOUS
  Administered 2013-05-10 – 2013-05-11 (×2): 1 mg via INTRAVENOUS
  Filled 2013-05-09 (×6): qty 1

## 2013-05-09 MED ORDER — PHENYLEPHRINE HCL 10 MG/ML IJ SOLN
INTRAMUSCULAR | Status: DC | PRN
  Administered 2013-05-09: 80 ug via INTRAVENOUS

## 2013-05-09 MED ORDER — DOCUSATE SODIUM 100 MG PO CAPS
100.0000 mg | ORAL_CAPSULE | Freq: Two times a day (BID) | ORAL | Status: DC
  Administered 2013-05-09 – 2013-05-11 (×3): 100 mg via ORAL

## 2013-05-09 MED ORDER — LEVOTHYROXINE SODIUM 100 MCG PO TABS
100.0000 ug | ORAL_TABLET | Freq: Every day | ORAL | Status: DC
  Administered 2013-05-10 – 2013-05-11 (×2): 100 ug via ORAL
  Filled 2013-05-09 (×3): qty 1

## 2013-05-09 MED ORDER — EPHEDRINE SULFATE 50 MG/ML IJ SOLN
INTRAMUSCULAR | Status: AC
  Filled 2013-05-09: qty 1

## 2013-05-09 MED ORDER — OXYCODONE HCL 5 MG PO TABS
5.0000 mg | ORAL_TABLET | ORAL | Status: DC | PRN
  Administered 2013-05-09 (×2): 5 mg via ORAL
  Administered 2013-05-09 – 2013-05-11 (×4): 10 mg via ORAL
  Filled 2013-05-09: qty 2
  Filled 2013-05-09: qty 1
  Filled 2013-05-09: qty 2
  Filled 2013-05-09: qty 1
  Filled 2013-05-09 (×2): qty 2

## 2013-05-09 MED ORDER — ACETAMINOPHEN 325 MG PO TABS: 650.0000 mg | ORAL_TABLET | Freq: Four times a day (QID) | ORAL | Status: DC | PRN

## 2013-05-09 MED ORDER — MENTHOL 3 MG MT LOZG
1.0000 | LOZENGE | OROMUCOSAL | Status: DC | PRN
  Filled 2013-05-09: qty 9

## 2013-05-09 MED ORDER — BUPIVACAINE HCL (PF) 0.5 % IJ SOLN
INTRAMUSCULAR | Status: DC | PRN
  Administered 2013-05-09: 3 mL

## 2013-05-09 MED ORDER — CYCLOBENZAPRINE HCL 10 MG PO TABS
10.0000 mg | ORAL_TABLET | Freq: Two times a day (BID) | ORAL | Status: DC | PRN
  Administered 2013-05-09 – 2013-05-11 (×4): 10 mg via ORAL
  Filled 2013-05-09 (×4): qty 1

## 2013-05-09 MED ORDER — ONDANSETRON HCL 4 MG/2ML IJ SOLN
INTRAMUSCULAR | Status: DC | PRN
  Administered 2013-05-09: 4 mg via INTRAVENOUS

## 2013-05-09 MED ORDER — ONDANSETRON HCL 4 MG/2ML IJ SOLN: 4.0000 mg | Freq: Four times a day (QID) | INTRAMUSCULAR | Status: DC | PRN

## 2013-05-09 MED ORDER — ALBUTEROL SULFATE (2.5 MG/3ML) 0.083% IN NEBU: 3.0000 mL | INHALATION_SOLUTION | Freq: Four times a day (QID) | RESPIRATORY_TRACT | Status: DC | PRN

## 2013-05-09 MED ORDER — FENTANYL 50 MCG/HR TD PT72: 50.0000 ug | MEDICATED_PATCH | TRANSDERMAL | Status: DC

## 2013-05-09 MED ORDER — FENTANYL CITRATE 0.05 MG/ML IJ SOLN
INTRAMUSCULAR | Status: DC | PRN
  Administered 2013-05-09: 50 ug via INTRAVENOUS

## 2013-05-09 MED ORDER — DEXAMETHASONE SODIUM PHOSPHATE 10 MG/ML IJ SOLN: 10.0000 mg | Freq: Once | INTRAMUSCULAR | Status: DC

## 2013-05-09 MED ORDER — LACTATED RINGERS IV SOLN
INTRAVENOUS | Status: DC | PRN
Start: 2013-05-09 — End: 2013-05-09
  Administered 2013-05-09 (×3): via INTRAVENOUS

## 2013-05-09 SURGICAL SUPPLY — 40 items
ADH SKN CLS APL DERMABOND .7 (GAUZE/BANDAGES/DRESSINGS) ×1
BAG SPEC THK2 15X12 ZIP CLS (MISCELLANEOUS)
BAG ZIPLOCK 12X15 (MISCELLANEOUS) IMPLANT
BLADE SAW SGTL 18X1.27X75 (BLADE) ×2 IMPLANT
BLADE SAW SGTL 18X1.27X75MM (BLADE) ×1
CAPT HIP PF COP ×2 IMPLANT
DERMABOND ADVANCED (GAUZE/BANDAGES/DRESSINGS) ×2
DERMABOND ADVANCED .7 DNX12 (GAUZE/BANDAGES/DRESSINGS) ×1 IMPLANT
DRAPE C-ARM 42X120 X-RAY (DRAPES) ×3 IMPLANT
DRAPE STERI IOBAN 125X83 (DRAPES) ×3 IMPLANT
DRAPE U-SHAPE 47X51 STRL (DRAPES) ×9 IMPLANT
DRSG AQUACEL AG ADV 3.5X10 (GAUZE/BANDAGES/DRESSINGS) ×3 IMPLANT
DRSG TEGADERM 4X4.75 (GAUZE/BANDAGES/DRESSINGS) IMPLANT
DURAPREP 26ML APPLICATOR (WOUND CARE) ×3 IMPLANT
ELECT BLADE TIP CTD 4 INCH (ELECTRODE) ×3 IMPLANT
ELECT REM PT RETURN 9FT ADLT (ELECTROSURGICAL) ×3
ELECTRODE REM PT RTRN 9FT ADLT (ELECTROSURGICAL) ×1 IMPLANT
EVACUATOR 1/8 PVC DRAIN (DRAIN) IMPLANT
FACESHIELD LNG OPTICON STERILE (SAFETY) ×12 IMPLANT
GAUZE SPONGE 2X2 8PLY STRL LF (GAUZE/BANDAGES/DRESSINGS) IMPLANT
GLOVE BIOGEL PI IND STRL 7.5 (GLOVE) ×1 IMPLANT
GLOVE BIOGEL PI IND STRL 8 (GLOVE) ×1 IMPLANT
GLOVE BIOGEL PI INDICATOR 7.5 (GLOVE) ×2
GLOVE BIOGEL PI INDICATOR 8 (GLOVE) ×2
GLOVE ECLIPSE 8.0 STRL XLNG CF (GLOVE) ×3 IMPLANT
GLOVE ORTHO TXT STRL SZ7.5 (GLOVE) ×6 IMPLANT
GOWN SPEC L3 XXLG W/TWL (GOWN DISPOSABLE) ×3 IMPLANT
GOWN STRL REUS W/TWL LRG LVL3 (GOWN DISPOSABLE) ×3 IMPLANT
KIT BASIN OR (CUSTOM PROCEDURE TRAY) ×3 IMPLANT
PACK TOTAL JOINT (CUSTOM PROCEDURE TRAY) ×3 IMPLANT
PADDING CAST COTTON 6X4 STRL (CAST SUPPLIES) ×3 IMPLANT
SPONGE GAUZE 2X2 STER 10/PKG (GAUZE/BANDAGES/DRESSINGS)
SUT MNCRL AB 4-0 PS2 18 (SUTURE) ×3 IMPLANT
SUT VIC AB 1 CT1 36 (SUTURE) ×9 IMPLANT
SUT VIC AB 2-0 CT1 27 (SUTURE) ×6
SUT VIC AB 2-0 CT1 TAPERPNT 27 (SUTURE) ×2 IMPLANT
SUT VLOC 180 0 24IN GS25 (SUTURE) ×3 IMPLANT
TOWEL OR 17X26 10 PK STRL BLUE (TOWEL DISPOSABLE) ×3 IMPLANT
TOWEL OR NON WOVEN STRL DISP B (DISPOSABLE) IMPLANT
TRAY FOLEY CATH 14FRSI W/METER (CATHETERS) ×3 IMPLANT

## 2013-05-09 NOTE — Anesthesia Procedure Notes (Signed)
Spinal  Patient location during procedure: OR Staffing Anesthesiologist: Montez Hageman Performed by: anesthesiologist  Preanesthetic Checklist Completed: patient identified, site marked, surgical consent, pre-op evaluation, timeout performed, IV checked, risks and benefits discussed and monitors and equipment checked Spinal Block Patient position: sitting Prep: ChloraPrep Patient monitoring: heart rate, continuous pulse ox and blood pressure Approach: left paramedian Location: L2-3 Injection technique: single-shot Needle Needle type: Sprotte  Needle gauge: 24 G Needle length: 9 cm Additional Notes Expiration date of kit checked and confirmed. Patient tolerated procedure well, without complications.

## 2013-05-09 NOTE — Transfer of Care (Signed)
Immediate Anesthesia Transfer of Care Note  Patient: Jenna Contreras  Procedure(s) Performed: Procedure(s): RIGHT TOTAL HIP ARTHROPLASTY ANTERIOR APPROACH (Right)  Patient Location: PACU  Anesthesia Type:Spinal  Level of Consciousness: awake, alert , oriented and patient cooperative  Airway & Oxygen Therapy: Patient Spontanous Breathing and Patient connected to face mask oxygen  Post-op Assessment: Report given to PACU RN and Post -op Vital signs reviewed and stable  Post vital signs: Reviewed and stable  Complications: No apparent anesthesia complications

## 2013-05-09 NOTE — Op Note (Signed)
NAME:  Jenna Contreras NO.: [id number]      MEDICAL RECORD NO.: [id number]      FACILITY:  [facility]      PHYSICIAN:  Paralee Cancel D  DATE OF BIRTH:  11-20-1945     DATE OF PROCEDURE:  05/09/2013                                 OPERATIVE REPORT         PREOPERATIVE DIAGNOSIS: Right  hip osteoarthritis.      POSTOPERATIVE DIAGNOSIS:  Right hip osteoarthritis.      PROCEDURE:  Right total hip replacement through an anterior approach   utilizing DePuy THR system, component size 40mm pinnacle cup, a size 32+4 neutral   Altrex liner, a size 4 Hi Tri Lock stem with a 32+1 delta ceramic   ball.      SURGEON:  Pietro Cassis. Alvan Dame, M.D.      ASSISTANT:  Molli Barrows, PA-C     ANESTHESIA:  Spinal.      SPECIMENS:  None.      COMPLICATIONS:  None.      BLOOD LOSS:  500 cc     DRAINS:  None.      INDICATION OF THE PROCEDURE:  Jenna Contreras is a 68 y.o. female who had   presented to office for evaluation of right hip pain.  Radiographs revealed   progressive degenerative changes with bone-on-bone   articulation to the  hip joint.  The patient had painful limited range of   motion significantly affecting their overall quality of life.  The patient was failing to    respond to conservative measures, and at this point was ready   to proceed with more definitive measures.  The patient has noted progressive   degenerative changes in his hip, progressive problems and dysfunction   with regarding the hip prior to surgery.  Consent was obtained for   benefit of pain relief.  Specific risk of infection, DVT, component   failure, dislocation, need for revision surgery, as well discussion of   the anterior versus posterior approach were reviewed.  Consent was   obtained for benefit of anterior pain relief through an anterior   approach.      PROCEDURE IN DETAIL:  The patient was brought to operative theater.   Once adequate anesthesia,  preoperative antibiotics, 900mg  of Cleocin administered.   The patient was positioned supine on the OSI Hanna table.  Once adequate   padding of boney process was carried out, we had predraped out the hip, and  used fluoroscopy to confirm orientation of the pelvis and position.      The right hip was then prepped and draped from proximal iliac crest to   mid thigh with shower curtain technique.      Time-out was performed identifying the patient, planned procedure, and   extremity.     An incision was then made 2 cm distal and lateral to the   anterior superior iliac spine extending over the orientation of the   tensor fascia lata muscle and sharp dissection was carried down to the   fascia of the muscle and protractor placed in the soft tissues.      The fascia was then incised.  The muscle belly was identified and swept  laterally and retractor placed along the superior neck.  Following   cauterization of the circumflex vessels and removing some pericapsular   fat, a second cobra retractor was placed on the inferior neck.  A third   retractor was placed on the anterior acetabulum after elevating the   anterior rectus.  A L-capsulotomy was along the line of the   superior neck to the trochanteric fossa, then extended proximally and   distally.  Tag sutures were placed and the retractors were then placed   intracapsular.  We then identified the trochanteric fossa and   orientation of my neck cut, confirmed this radiographically   and then made a neck osteotomy with the femur on traction.  The femoral   head was removed without difficulty or complication.  Traction was let   off and retractors were placed posterior and anterior around the   acetabulum.      The labrum and foveal tissue were debrided.  I began reaming with a 23mm   reamer and reamed up to 67mm reamer with good bony bed preparation and a 46mm   cup was chosen.  The final 74mm Pinnacle cup was then impacted under  fluoroscopy  to confirm the depth of penetration and orientation with respect to   abduction.  A screw was placed followed by the hole eliminator.  The final   32+4 neutral Altrex liner was impacted with good visualized rim fit.  The cup was positioned anatomically within the acetabular portion of the pelvis.      At this point, the femur was rolled at 80 degrees.  Further capsule was   released off the inferior aspect of the femoral neck.  I then   released the superior capsule proximally.  The hook was placed laterally   along the femur and elevated manually and held in position with the bed   hook.  The leg was then extended and adducted with the leg rolled to 100   degrees of external rotation.  Once the proximal femur was fully   exposed, I used a box osteotome to set orientation.  I then began   broaching with the starting chili pepper broach and passed this by hand and then broached up to 4.  With the 4 broach in place I chose a high offset neck and did a trial reduction.  The offset was appropriate, leg lengths   appeared to be equal, confirmed radiographically.   Given these findings, I went ahead and dislocated the hip, repositioned all   retractors and positioned the right hip in the extended and abducted position.  The final 4 Hi Tri Lock stem was   chosen and it was impacted down to the level of neck cut.  Based on this   and the trial reduction, a 32+1 delta ceramic ball was chosen and   impacted onto a clean and dry trunnion, and the hip was reduced.  The   hip had been irrigated throughout the case again at this point.  I did   reapproximate the superior capsular leaflet to the anterior leaflet   using #1 Vicryl.  The fascia of the   tensor fascia lata muscle was then reapproximated using #1 Vicryl and #0 V-lock sutures.  The   remaining wound was closed with 2-0 Vicryl and running 4-0 Monocryl.   The hip was cleaned, dried, and dressed sterilely using Dermabond and   Aquacel  dressing.  She was then brought   to recovery room  in stable condition tolerating the procedure well.    Molli Barrows, PA-C was present for the entirety of the case involved from   preoperative positioning, perioperative retractor management, general   facilitation of the case, as well as primary wound closure as assistant.            Pietro Cassis Alvan Dame, M.D.        05/09/2013 8:49 AM

## 2013-05-09 NOTE — Evaluation (Addendum)
Physical Therapy Evaluation Patient Details Name: Jenna Contreras MRN: [id number] DOB: [birth year] Today's Date: 05/09/2013 Time: 1355-1445 PT Time Calculation (min): 50 min  PT Assessment / Plan / Recommendation History of Present Illness  R DATHA  Clinical Impression  Pt appears in significant pain, slowly able to mobilize pt to L side and sitting and standing at the bedside, then assisted back to ed in L sidelying. This position was comfortable for a few minutes then began Back pain/spasms. Attempted to extend R knee with increased pain. Pt left in semiturned postion in bed. RN aware of  C/o pain and reported pt had had max amount of pain meds. Ice applied to Ant thigh, small heat pack to her back on the right.. Pt will beneift from PT to address problems listed.    PT Assessment  Patient needs continued PT services    Follow Up Recommendations  snf/ 24 hour caregivers/    Does the patient have the potential to tolerate intense rehabilitation      Barriers to Discharge        Equipment Recommendations  Rolling walker with 5" wheels    Recommendations for Other Services     Frequency 7X/week    Precautions / Restrictions Precautions Precaution Comments: severe spasms of R low back, R knee pain   Pertinent Vitals/Pain Pt in tears related to pain. Repositioned.RN aware.     Mobility  Bed Mobility Overal bed mobility: Needs Assistance;+2 for physical assistance;+ 2 for safety/equipment Bed Mobility: Sidelying to Sit;Sit to Sidelying Sidelying to sit: +2 for physical assistance;+2 for safety/equipment;Max assist;HOB elevated Sit to sidelying: Max assist;HOB elevated General bed mobility comments: extra time required to roll to L side, HOB raised to lift pt to sitting, legs supported and moved over the edge, trunk assisted to sitting upright. Pillow maintained between knees. HOB raised and pt lifted legs onto bed using sheet to lift both feet onto bed with PT assistance,   Transfers Overall transfer level: Needs assistance Equipment used: Rolling walker (2 wheeled) Transfers: Sit to/from Stand Sit to Stand: +2 safety/equipment;Mod assist;From elevated surface General transfer comment: SLOWLY raised bed and pt stood at edge of bed with RW., Pt unable to bear weight on R Leg. Pt scooted on L foot to the left.  to move along the side of the bed. Pt slowly sat down and scooted self back onto bed.    Exercises     PT Diagnosis: Difficulty walking;Acute pain  PT Problem List: Decreased range of motion;Decreased activity tolerance;Decreased mobility;Decreased knowledge of precautions;Decreased safety awareness;Decreased knowledge of use of DME;Pain PT Treatment Interventions: DME instruction;Gait training;Stair training;Functional mobility training;Therapeutic activities;Therapeutic exercise;Patient/family education     PT Goals(Current goals can be found in the care plan section) Acute Rehab PT Goals Patient Stated Goal: I want to not have this bad pain. PT Goal Formulation: With patient Time For Goal Achievement: 05/16/13 Potential to Achieve Goals: Good  Visit Information  Last PT Received On: 05/09/13 Assistance Needed: +2 History of Present Illness: R DATHA       Prior Functioning  Home Living Family/patient expects to be discharged to:: Private residence Living Arrangements: Spouse/significant other Available Help at Discharge: Family Type of Home: House Home Access: Stairs to enter Technical brewer of Steps: 2-3 Home Layout: One level Home Equipment: None Prior Function Level of Independence: Independent Communication Communication: No difficulties    Cognition  Cognition Arousal/Alertness: Awake/alert Behavior During Therapy: Restless;Anxious (appears in pain,) Overall Cognitive Status: Within Functional Limits for tasks  assessed    Extremity/Trunk Assessment Upper Extremity Assessment Upper Extremity Assessment: Defer to OT  evaluation Lower Extremity Assessment Lower Extremity Assessment: RLE deficits/detail RLE Deficits / Details: lwith extra time, able to flex  Hip and knee to about 50/50 in supine, pillow placed between knees for support. Tolerated 90/90 in sitting  Cervical / Trunk Assessment Cervical / Trunk Assessment: Other exceptions Cervical / Trunk Exceptions: scoliotic   Balance    End of Session PT - End of Session Activity Tolerance: Patient limited by pain Patient left: in bed;with call bell/phone within reach;with family/visitor present Nurse Communication: Mobility status;Patient requests pain meds  GP     [name] 05/09/2013, 4:22 PM

## 2013-05-09 NOTE — Progress Notes (Signed)
Clinical Social Work Department BRIEF PSYCHOSOCIAL ASSESSMENT 05/09/2013  Patient:  TUYET, BADER     Account Number:  [id number]     Admit date:  05/09/2013  Clinical Social Worker:  Lacie Scotts  Date/Time:  05/09/2013 02:39 PM  Referred by:  Physician  Date Referred:  05/09/2013 Referred for  SNF Placement   Other Referral:   Interview type:  Patient Other interview type:    PSYCHOSOCIAL DATA Living Status:  HUSBAND Admitted from facility:   Level of care:   Primary support name:  Kennieth Francois Primary support relationship to patient:  SPOUSE Degree of support available:   unclear    CURRENT CONCERNS Current Concerns  Post-Acute Placement   Other Concerns:    SOCIAL WORK ASSESSMENT / PLAN Pt is a 68 yr old female living at home prior to hospitalization. CSW met with pt  /family to assist with d/c planning. ST Rehab will be needed following hospital d/c. CSW has initiated SN[place] in [place] and will provide bed offers in the am. CSW will continue to follow to assist with d/c planning to SNF.   Assessment/plan status:  Psychosocial Support/Ongoing Assessment of Needs Other assessment/ plan:   Information/referral to community resources:   SNF list with bed offers to be provided.    PATIENT'S/FAMILY'S RESPONSE TO PLAN OF CARE: Pt recently arrived to 6e from surgery. She is tearful and complaining of pain. Pt states nsg is aware and has provided meds. Pt and CSW made plans to meet in the am to talk more about rehab when her pain was better controlled.   Werner Lean LCSW [phone]

## 2013-05-09 NOTE — Progress Notes (Signed)
Utilization review completed.  

## 2013-05-09 NOTE — Anesthesia Preprocedure Evaluation (Addendum)
Anesthesia Evaluation  Patient identified by MRN, date of birth, ID band Patient awake    Reviewed: Allergy & Precautions, H&P , NPO status , Patient's Chart, lab work & pertinent test results  History of Anesthesia Complications Negative for: history of anesthetic complications  Airway Mallampati: II TM Distance: >3 FB Neck ROM: Full    Dental no notable dental hx.    Pulmonary asthma , former smoker,  breath sounds clear to auscultation  Pulmonary exam normal       Cardiovascular negative cardio ROS  Rhythm:Regular Rate:Normal     Neuro/Psych negative neurological ROS  negative psych ROS   GI/Hepatic negative GI ROS, Neg liver ROS,   Endo/Other  negative endocrine ROS  Renal/GU negative Renal ROS  negative genitourinary   Musculoskeletal  (+) Fibromyalgia -  Abdominal   Peds negative pediatric ROS (+)  Hematology negative hematology ROS (+)   Anesthesia Other Findings   Reproductive/Obstetrics negative OB ROS                          Anesthesia Physical Anesthesia Plan  ASA: II  Anesthesia Plan: Spinal   Post-op Pain Management:    Induction: Intravenous  Airway Management Planned: Simple Face Mask  Additional Equipment:   Intra-op Plan:   Post-operative Plan:   Informed Consent: I have reviewed the patients History and Physical, chart, labs and discussed the procedure including the risks, benefits and alternatives for the proposed anesthesia with the patient or authorized representative who has indicated his/her understanding and acceptance.   Dental advisory given  Plan Discussed with: CRNA  Anesthesia Plan Comments:         Anesthesia Quick Evaluation

## 2013-05-09 NOTE — Anesthesia Postprocedure Evaluation (Signed)
  Anesthesia Post-op Note  Patient: Jenna Contreras  Procedure(s) Performed: Procedure(s) (LRB): RIGHT TOTAL HIP ARTHROPLASTY ANTERIOR APPROACH (Right)  Patient Location: PACU  Anesthesia Type: Spinal  Level of Consciousness: awake and alert   Airway and Oxygen Therapy: Patient Spontanous Breathing  Post-op Pain: mild  Post-op Assessment: Post-op Vital signs reviewed, Patient's Cardiovascular Status Stable, Respiratory Function Stable, Patent Airway and No signs of Nausea or vomiting  Last Vitals:  Filed Vitals:   05/09/13 1345  BP: 148/69  Pulse: 88  Temp: 36.8 C  Resp: 16    Post-op Vital Signs: stable   Complications: No apparent anesthesia complications

## 2013-05-09 NOTE — Interval H&P Note (Signed)
History and Physical Interval Note:  05/09/2013 7:12 AM  Jenna Contreras  has presented today for surgery, with the diagnosis of right hip osteoarthritis  The various methods of treatment have been discussed with the patient and family. After consideration of risks, benefits and other options for treatment, the patient has consented to  Procedure(s): RIGHT TOTAL HIP ARTHROPLASTY ANTERIOR APPROACH (Right) as a surgical intervention .  The patient's history has been reviewed, patient examined, no change in status, stable for surgery.  I have reviewed the patient's chart and labs.  Questions were answered to the patient's satisfaction.     Mauri Pole

## 2013-05-10 DIAGNOSIS — E663 Overweight: Secondary | ICD-10-CM | POA: Diagnosis present

## 2013-05-10 DIAGNOSIS — D500 Iron deficiency anemia secondary to blood loss (chronic): Secondary | ICD-10-CM | POA: Diagnosis not present

## 2013-05-10 LAB — CBC
HEMATOCRIT: 27.4 % — AB (ref 36.0–46.0)
HEMOGLOBIN: 8.9 g/dL — AB (ref 12.0–15.0)
MCH: 28.5 pg (ref 26.0–34.0)
MCHC: 32.5 g/dL (ref 30.0–36.0)
MCV: 87.8 fL (ref 78.0–100.0)
Platelets: 160 10*3/uL (ref 150–400)
RBC: 3.12 MIL/uL — ABNORMAL LOW (ref 3.87–5.11)
RDW: 14 % (ref 11.5–15.5)
WBC: 5.4 10*3/uL (ref 4.0–10.5)

## 2013-05-10 LAB — BASIC METABOLIC PANEL
BUN: 9 mg/dL (ref 6–23)
CHLORIDE: 106 meq/L (ref 96–112)
CO2: 25 mEq/L (ref 19–32)
Calcium: 7.7 mg/dL — ABNORMAL LOW (ref 8.4–10.5)
Creatinine, Ser: 0.73 mg/dL (ref 0.50–1.10)
GFR, EST NON AFRICAN AMERICAN: 85 mL/min — AB (ref >90)
GLUCOSE: 102 mg/dL — AB (ref 70–99)
Potassium: 3.9 mEq/L (ref 3.7–5.3)
Sodium: 139 mEq/L (ref 137–147)

## 2013-05-10 MED ORDER — FERROUS SULFATE 325 (65 FE) MG PO TABS: 325.0000 mg | ORAL_TABLET | Freq: Three times a day (TID) | ORAL | Status: DC

## 2013-05-10 MED ORDER — RIVAROXABAN 10 MG PO TABS: 10.0000 mg | ORAL_TABLET | Freq: Every day | ORAL | Status: DC

## 2013-05-10 MED ORDER — DSS 100 MG PO CAPS: 100.0000 mg | ORAL_CAPSULE | Freq: Two times a day (BID) | ORAL | Status: DC

## 2013-05-10 MED ORDER — SODIUM CHLORIDE 0.9 % IV BOLUS (SEPSIS)
250.0000 mL | Freq: Once | INTRAVENOUS | Status: AC
  Administered 2013-05-10: 250 mL via INTRAVENOUS

## 2013-05-10 MED ORDER — POLYETHYLENE GLYCOL 3350 17 G PO PACK: 17.0000 g | PACK | Freq: Every day | ORAL | Status: DC | PRN

## 2013-05-10 MED ORDER — OXYCODONE HCL 5 MG PO TABS: 5.0000 mg | ORAL_TABLET | ORAL | Status: DC | PRN

## 2013-05-10 MED ORDER — ASPIRIN EC 325 MG PO TBEC: 325.0000 mg | DELAYED_RELEASE_TABLET | Freq: Two times a day (BID) | ORAL | Status: AC

## 2013-05-10 NOTE — Progress Notes (Signed)
Physical Therapy Treatment Patient Details Name: Laquinta Hazell MRN: [id number] DOB: 05/10/1945 Today's Date: 05/10/2013 Time: 0865-7846 PT Time Calculation (min): 38 min  PT Assessment / Plan / Recommendation  History of Present Illness R DATHA   PT Comments   Pt is much improved this session, no dizziness during mobility and able to ambulate in room x 15'. Pt's spouse present to observe and assist with bed mobility. Pt  With minmal pain today, just R thigh is sore. Will see howpt progresses tomorrow. Pt/ spouse encouraged by progress.  Follow Up Recommendations  Home health PT;SNF     Does the patient have the potential to tolerate intense rehabilitation     Barriers to Discharge        Equipment Recommendations  Rolling walker with 5" wheels;3in1 (PT)    Recommendations for Other Services    Frequency 7X/week   Progress towards PT Goals Progress towards PT goals: Progressing toward goals  Plan Current plan remains appropriate    Precautions / Restrictions Precautions Precaution Comments: monitor BP-low   Pertinent Vitals/Pain Pre 95/59 Post 90/56    Mobility  Bed Mobility Overal bed mobility: Needs Assistance Bed Mobility: Sit to Supine Supine to sit: HOB elevated;Min assist Sit to supine: Min assist;HOB elevated General bed mobility comments: extra time to use leg lifter, spouse present to assist with lifting leg onto bed, pt guiding  him. Use of rail and HOB elevated. Transfers Overall transfer level: Needs assistance Equipment used: Rolling walker (2 wheeled) Transfers: Sit to/from Stand Sit to Stand: Min assist Stand pivot transfers: Min assist General transfer comment: Pt stood at bed side x 5 minutes, increased weight on R leg  while standing. Pt  then able to walk. Ambulation/Gait Ambulation/Gait assistance: Min assist Ambulation Distance (Feet): 15 Feet Assistive device: Rolling walker (2 wheeled) Gait Pattern/deviations: Step-to pattern General  Gait Details: pt placing weight on RLE , not painful.    Exercises Total Joint Exercises Ankle Circles/Pumps: AROM;Both;10 reps;Supine Heel Slides: AAROM;Right;5 reps;Supine   PT Diagnosis:    PT Problem List:   PT Treatment Interventions:     PT Goals (current goals can now be found in the care plan section)    Visit Information  Last PT Received On: 05/10/13 Assistance Needed: +1 History of Present Illness: R DATHA    Subjective Data      Cognition  Cognition Arousal/Alertness: Awake/alert Behavior During Therapy: WFL for tasks assessed/performed Overall Cognitive Status: Within Functional Limits for tasks assessed    Balance     End of Session PT - End of Session Activity Tolerance: Patient tolerated treatment well Patient left: in bed;with call bell/phone within reach;with family/visitor present Nurse Communication: Mobility status   GP     [name] 05/10/2013, 3:28 PM

## 2013-05-10 NOTE — Progress Notes (Signed)
   CARE MANAGEMENT NOTE 05/10/2013  Patient:  Jenna Contreras, Jenna Contreras   Account Number:  [id number]  Date Initiated:  05/10/2013  Documentation initiated by:  [facility]  Subjective/Objective Assessment:   RIGHT TOTAL HIP ARTHROPLASTY ANTERIOR APPROACH     Action/Plan:   SNF   Anticipated DC Date:  05/12/2013   Anticipated DC Plan:  [place]  CM consult      Choice offered to / List presented to:             Status of service:  Completed, signed off Medicare Important Message given?   (If response is "NO", the following Medicare IM given date fields will be blank) Date Medicare IM given:   Date Additional Medicare IM given:    Discharge Disposition:  [place]  Per UR Regulation:    If discussed at Long Length of Stay Meetings, dates discussed:    Comments:  No NCM needs identified. Jonnie Finner RN CCM Case Mgmt phone [phone]

## 2013-05-10 NOTE — Discharge Instructions (Signed)
Information on my medicine - XARELTO (Rivaroxaban)  This medication education was reviewed with me or my healthcare representative as part of my discharge preparation.  The pharmacist that spoke with me during my hospital stay was:  Venson Ferencz, Julieta Bellini, RPH  Why was Xarelto prescribed for you? Xarelto was prescribed for you to reduce the risk of blood clots forming after orthopedic surgery. The medical term for these abnormal blood clots is venous thromboembolism (VTE).  What do you need to know about xarelto ? Take your Xarelto ONCE DAILY at the same time every day. You may take it either with or without food.  If you have difficulty swallowing the tablet whole, you may crush it and mix in applesauce just prior to taking your dose.  Take Xarelto exactly as prescribed by your doctor and DO NOT stop taking Xarelto without talking to the doctor who prescribed the medication.  Stopping without other VTE prevention medication to take the place of Xarelto may increase your risk of developing a clot.  After discharge, you should have regular check-up appointments with your healthcare provider that is prescribing your Xarelto.    What do you do if you miss a dose? If you miss a dose, take it as soon as you remember on the same day then continue your regularly scheduled once daily regimen the next day. Do not take two doses of Xarelto on the same day.   Important Safety Information A possible side effect of Xarelto is bleeding. You should call your healthcare provider right away if you experience any of the following:   Bleeding from an injury or your nose that does not stop.   Unusual colored urine (red or dark brown) or unusual colored stools (red or black).   Unusual bruising for unknown reasons.   A serious fall or if you hit your head (even if there is no bleeding).  Some medicines may interact with Xarelto and might increase your risk of bleeding while on Xarelto. To help avoid  this, consult your healthcare provider or pharmacist prior to using any new prescription or non-prescription medications, including herbals, vitamins, non-steroidal anti-inflammatory drugs (NSAIDs) and supplements.  This website has more information on Xarelto: [email or web address]-[email or web address].[email or web address]

## 2013-05-10 NOTE — Progress Notes (Signed)
   Subjective: 1 Day Post-Op Procedure(s) (LRB): RIGHT TOTAL HIP ARTHROPLASTY ANTERIOR APPROACH (Right)   Patient reports pain as mild, pain controlled. Resting in bed. No events throughout the night. Ready to be discharged home if she does well with PT and pain stays well controlled.   Objective:   VITALS:   Filed Vitals:   05/10/13  BP: 82/55  Pulse: 75  Temp: 99.1 F (37.3 C)   Resp: 16    Neurovascular intact Dorsiflexion/Plantar flexion intact Incision: dressing C/D/I No cellulitis present Compartment soft  LABS  Recent Labs  05/10/13 0415  HGB 8.9*  HCT 27.4*  WBC 5.4  PLT 160     Recent Labs  05/10/13 0415  NA 139  K 3.9  BUN 9  CREATININE 0.73  GLUCOSE 102*     Assessment/Plan: 1 Day Post-Op Procedure(s) (LRB): RIGHT TOTAL HIP ARTHROPLASTY ANTERIOR APPROACH (Right) Foley cath d/c'ed Advance diet Up with therapy D/C IV fluids Discharge home with home health Follow up in 2 weeks at [facility]. Follow up with OLIN,Zamira Hickam D in 2 weeks.  Contact information:  [facility] [address] [place] [phone]    Expected ABLA  Treated with iron and will observe  Overweight (BMI 25-29.9) Estimated body mass index is 27.22 kg/(m^2) as calculated from the following:   Height as of this encounter: 5\' 1"  (1.549 m).   Weight as of this encounter: 65.318 kg (144 lb). Patient also counseled that weight may inhibit the healing process Patient counseled that losing weight will help with future health issues        [place] Pugh. Kalena Mander   PAC  05/10/2013, 9:09 AM

## 2013-05-10 NOTE — Progress Notes (Signed)
OT Cancellation Note  Patient Details Name: Jenna Contreras MRN: [id number] DOB: 1946/01/07   Cancelled Treatment:    Reason Eval/Treat Not Completed: Other (comment)  Pt plans snf for rehab and likely won't need OT for admission. Will defer OT eval to that venue.  Jahayra Mazo 05/10/2013, 7:32 AM Lesle Chris, OTR/L [phone] 05/10/2013

## 2013-05-10 NOTE — Progress Notes (Addendum)
Physical Therapy Treatment Patient Details Name: Danise Dehne MRN: [id number] DOB: 07-Dec-1945 Today's Date: 05/10/2013 Time: 5035-4656 PT Time Calculation (min): 36 min  PT Assessment / Plan / Recommendation  History of Present Illness R DATHA   PT Comments   Pt in less pain, able to mobilize to recliner,  Pt may require ST SNF, will need to see how she progresses.  Follow Up Recommendations  SNF (vs. HHPT)     Does the patient have the potential to tolerate intense rehabilitation     Barriers to Discharge        Equipment Recommendations  Rolling walker with 5" wheels;3in1 (PT)    Recommendations for Other Services    Frequency 7X/week   Progress towards PT Goals Progress towards PT goals: Progressing toward goals  Plan Current plan remains appropriate    Precautions / Restrictions Precautions Precaution Comments: monitor BP-low   Pertinent Vitals/Pain >4 R hip except when foot jerked after catching on sheet. Supine 96/61 Sit95/64 After transfer 109/67 After rest in recliner 93/64  no dizziness.   Mobility  Bed Mobility Overal bed mobility: Needs Assistance Bed Mobility: Supine to Sit Supine to sit: HOB elevated;Min assist General bed mobility comments: extra time for pt to get R leg flexed, assist to slide R leg to edge and lower with pt using leg lifter. pt used rail to assist trunk upright. Transfers Overall transfer level: Needs assistance Equipment used: Rolling walker (2 wheeled) Transfers: Sit to/from Stand Sit to Stand: Min assist;+2 safety/equipment Stand pivot transfers: Mod assist;+2 safety/equipment;+2 physical assistance General transfer comment: Pt sat x 5 minutes to accommodate and monitor BP before standing at RW, Pt reports not placing any weight oN R leg in standing. Pt took small hopping steps to turn and back up to reclner.    Exercises Total Joint Exercises Ankle Circles/Pumps: AROM;Both;10 reps;Supine Heel Slides: AAROM;Right;5  reps;Supine   PT Diagnosis:    PT Problem List:   PT Treatment Interventions:     PT Goals (current goals can now be found in the care plan section)    Visit Information  Last PT Received On: 05/10/13 Assistance Needed: +2 History of Present Illness: R DATHA    Subjective Data      Cognition  Cognition Arousal/Alertness: Awake/alert Behavior During Therapy: WFL for tasks assessed/performed Overall Cognitive Status: Within Functional Limits for tasks assessed    Balance     End of Session PT - End of Session Activity Tolerance: Patient tolerated treatment well Patient left: in chair;with call bell/phone within reach Nurse Communication: Mobility status (BP's)   GP     [name] 05/10/2013, 3:20 PM

## 2013-05-10 NOTE — Evaluation (Addendum)
Occupational Therapy Evaluation Patient Details Name: Jenna Contreras MRN: [id number] DOB: August 31, 1945 Today's Date: 05/10/2013 Time: 0762-2633 OT Time Calculation (min): 42 min  OT Assessment / Plan / Recommendation History of present illness R DATHA   Clinical Impression   Pt was admitted for the above surgery.  She will benefit from skilled OT to increase safety and independence with adls.  Goals in acute are for min guard.  Pt is limited by pain and she needs safety cues during adls.  Pt was seen for OT eval as she now plans to go home instead of rehab.  Would recommend continued HHOT for safety.    OT Assessment  Patient needs continued OT Services    Follow Up Recommendations  Home health OT    Barriers to Discharge      Equipment Recommendations  3 in 1 bedside comode    Recommendations for Other Services    Frequency  Min 2X/week    Precautions / Restrictions Precautions Precaution Comments: monitor BP-low Restrictions Weight Bearing Restrictions: No   Pertinent Vitals/Pain R hip, 8/10 after OT.  Repositioned with ice and requested pain meds    ADL  Grooming: Set up Where Assessed - Grooming: Unsupported sitting Upper Body Bathing: Set up Where Assessed - Upper Body Bathing: Unsupported sitting Lower Body Bathing: Minimal assistance (with ae) Where Assessed - Lower Body Bathing: Supported sit to stand Upper Body Dressing: Set up Where Assessed - Upper Body Dressing: Unsupported sitting Lower Body Dressing: Moderate assistance (with AE, reacher for pants) Where Assessed - Lower Body Dressing: Supported sit to stand Toilet Transfer: Minimal assistance Toilet Transfer Method: Sit to stand Toileting - Clothing Manipulation and Hygiene: Minimal assistance Where Assessed - Toileting Clothing Manipulation and Hygiene: Sit to stand from 3-in-1 or toilet Equipment Used: Reacher;Rolling walker Transfers/Ambulation Related to ADLs: ambulated back to bed after OT  eval.  She was returning from bathroom when I entered.  Min A for balance ADL Comments: Performed LB dressing with reacher:  pt is interested in getting this, although husband will assist.  Cues for safety as she loses balance in standing--performed twice as pt needed to change clothes--panties too tight initially.  Used leg lifter with help to get back to bed.  Pt's concerns are high bed (she uses 2 step step-stool) and dogs.  Advised to keep dogs out of area she is in unless she is sitting and has pillows on lap to protect sitet.  Husband will be with her 24/7.  Pt states she is accident prone.  Discussed 3:1 next to bed a night.  Also recommended she sponge bathe until back to 100% due to high tub and previous falls    OT Diagnosis: Generalized weakness  OT Problem List: Decreased strength;Decreased activity tolerance;Impaired balance (sitting and/or standing);Decreased safety awareness;Decreased knowledge of use of DME or AE;Pain OT Treatment Interventions: Self-care/ADL training;DME and/or AE instruction;Patient/family education;Balance training   OT Goals(Current goals can be found in the care plan section) Acute Rehab OT Goals Patient Stated Goal: get rid of pain OT Goal Formulation: With patient Time For Goal Achievement: 05/17/13 Potential to Achieve Goals: Good ADL Goals Pt Will Perform Grooming: with min guard assist;standing Pt Will Perform Lower Body Dressing: with min guard assist;sit to/from stand;with adaptive equipment (pants only) Pt Will Transfer to Toilet: with min guard assist;ambulating;bedside commode Pt Will Perform Toileting - Clothing Manipulation and hygiene: with min guard assist;sit to/from stand (one cue for keeping one hand on walker for safety)  Visit Information  Last OT Received On: 05/10/13 Assistance Needed: +1 History of Present Illness: R DATHA       Prior [place] expects to be discharged to:: Private  residence Home Equipment: None Prior Function Level of Independence: Independent Communication Communication: No difficulties         Vision/Perception     Cognition  Cognition Arousal/Alertness: Awake/alert Behavior During Therapy: WFL for tasks assessed/performed Overall Cognitive Status: Within Functional Limits for tasks assessed    Extremity/Trunk Assessment Upper Extremity Assessment Upper Extremity Assessment: Overall WFL for tasks assessed (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 Pt verbalizes but does not follow without cues     Exercise    Balance     End of Session OT - End of Session Activity Tolerance: Patient limited by pain Patient left: in bed;with call bell/phone within reach Nurse Communication: Patient requests pain meds  GO     Jenna Contreras 05/10/2013, 5:06 PM Lesle Chris, OTR/L [phone] 05/10/2013

## 2013-05-11 LAB — CBC
HCT: 27.6 % — ABNORMAL LOW (ref 36.0–46.0)
Hemoglobin: 9.1 g/dL — ABNORMAL LOW (ref 12.0–15.0)
MCH: 29.1 pg (ref 26.0–34.0)
MCHC: 33 g/dL (ref 30.0–36.0)
MCV: 88.2 fL (ref 78.0–100.0)
Platelets: 162 10*3/uL (ref 150–400)
RBC: 3.13 MIL/uL — AB (ref 3.87–5.11)
RDW: 14.2 % (ref 11.5–15.5)
WBC: 5.9 10*3/uL (ref 4.0–10.5)

## 2013-05-11 LAB — BASIC METABOLIC PANEL
BUN: 8 mg/dL (ref 6–23)
CHLORIDE: 105 meq/L (ref 96–112)
CO2: 24 meq/L (ref 19–32)
Calcium: 7.9 mg/dL — ABNORMAL LOW (ref 8.4–10.5)
Creatinine, Ser: 0.71 mg/dL (ref 0.50–1.10)
GFR calc Af Amer: >90 mL/min (ref >90)
GFR calc non Af Amer: 86 mL/min — ABNORMAL LOW (ref >90)
GLUCOSE: 93 mg/dL (ref 70–99)
Potassium: 4.1 mEq/L (ref 3.7–5.3)
SODIUM: 138 meq/L (ref 137–147)

## 2013-05-11 NOTE — Progress Notes (Addendum)
   Subjective: 2 Days Post-Op Procedure(s) (LRB): RIGHT TOTAL HIP ARTHROPLASTY ANTERIOR APPROACH (Right)   Seen by Dr. Alvan Dame.  Patient reports pain as mild, controlled with medication. No events throughout the night. Doing well with PT. Ready to be discharged home.  Objective:   VITALS:   Filed Vitals:   05/11/13 0628  BP: 100/62  Pulse: 88  Temp: 99.6 F (37.6 C)  Resp: 16    Neurovascular intact Dorsiflexion/Plantar flexion intact Incision: dressing C/D/I No cellulitis present Compartment soft  LABS  Recent Labs  05/10/13 0415 05/11/13 0508  HGB 8.9* 9.1*  HCT 27.4* 27.6*  WBC 5.4 5.9  PLT 160 162     Recent Labs  05/10/13 0415 05/11/13 0508  NA 139 138  K 3.9 4.1  BUN 9 8  CREATININE 0.73 0.71  GLUCOSE 102* 93     Assessment/Plan: 2 Days Post-Op Procedure(s) (LRB): RIGHT TOTAL HIP ARTHROPLASTY ANTERIOR APPROACH (Right) Up with therapy Discharge home with home health Follow up in 2 weeks at [facility]. Follow up with OLIN,Kelicia Youtz D in 2 weeks.  Contact information:  [facility] [address] [place] [phone]    Expected ABLA  Treated with iron and will observe   Overweight (BMI 25-29.9)  Estimated body mass index is 27.22 kg/(m^2) as calculated from the following:      Height as of this encounter: 5\' 1"  (1.549 m).      Weight as of this encounter: 65.318 kg (144 lb).  Patient also counseled that weight may inhibit the healing process  Patient counseled that losing weight will help with future health issues        [place]. Takila Kronberg   PAC  05/11/2013, 8:30 AM

## 2013-05-11 NOTE — Progress Notes (Addendum)
[facility]  [facility] is providing the following services: Commode and walker  If patient discharges after hours, please call [phone].   Linward Headland 05/11/2013, 1:47 PM

## 2013-05-11 NOTE — Progress Notes (Signed)
Clinical Social Work Department CLINICAL SOCIAL WORK PLACEMENT NOTE 05/11/2013  Patient:  Jenna Contreras, Jenna Contreras  Account Number:  [id number] Admit date:  05/09/2013  Clinical Social Worker:  Werner Lean, LCSW  Date/time:  05/09/2013 02:29 PM  Clinical Social Work is seeking post-discharge placement for this patient at the following level of care:   SKILLED NURSING   (*CSW will update this form in Epic as items are completed)   05/09/2013  Patient/family provided with [place] Department of Clinical Social Work's list of facilities offering this level of care within the geographic area requested by the patient (or if unable, by the patient's family).  05/09/2013  Patient/family informed of their freedom to choose among providers that offer the needed level of care, that participate in Medicare, Medicaid or managed care program needed by the patient, have an available bed and are willing to accept the patient.    Patient/family informed of [facility]' ownership interest in [facility], as well as of the fact that they are under no obligation to receive care at this facility.  PASARR submitted to EDS on 05/09/2013 PASARR number received from EDS on 05/09/2013  FL2 transmitted to all facilities in geographic area requested by pt/family on  05/09/2013 FL2 transmitted to all facilities within larger geographic area on   Patient informed that his/her managed care company has contracts with or will negotiate with  certain facilities, including the following:     Patient/family informed of bed offers received:  05/10/2013 Patient chooses bed at [facility] Physician recommends and patient chooses bed at    Patient to be transferred to  on   Patient to be transferred to facility by   The following physician request were entered in Epic:   Additional Comments: Pt progressed well enough with PT to return home with [facility] services. Pt d/c 05/11/13.  Werner Lean  LCSW [phone]

## 2013-05-11 NOTE — Progress Notes (Signed)
   CARE MANAGEMENT NOTE 05/11/2013  Patient:  Jenna Contreras, Jenna Contreras   Account Number:  [id number]  Date Initiated:  05/10/2013  Documentation initiated by:  [facility]  Subjective/Objective Assessment:   RIGHT TOTAL HIP ARTHROPLASTY ANTERIOR APPROACH     Action/Plan:   HH   Anticipated DC Date:  05/12/2013   Anticipated DC Plan:  [place]  CM consult      [facility] Choice  HOME HEALTH   Choice offered to / List presented to:  C-1 Patient        [place] arranged  [place] [facility]      [place].   Status of service:  Completed, signed off Medicare Important Message given?   (If response is "NO", the following Medicare IM given date fields will be blank) Date Medicare IM given:   Date Additional Medicare IM given:    Discharge Disposition:  [place]  Per UR Regulation:    If discussed at Long Length of Stay Meetings, dates discussed:    Comments:  05/11/2013 1500 NCM spoke to pt and offered choice for [facility]. Pt agreeable to [facility] for HH. Pt requested 3n1 and DME. Jonnie Finner RN CCM Case Mgmt phone [phone]  05/10/2013 No NCM needs identified. Jonnie Finner RN CCM Case Mgmt phone [phone]

## 2013-05-11 NOTE — Progress Notes (Signed)
Physical Therapy Treatment Patient Details Name: Jenna Contreras MRN: [id number] DOB: 1945/11/09 Today's Date: 05/11/2013 Time: 1220-1250 PT Time Calculation (min): 30 min  PT Assessment / Plan / Recommendation  History of Present Illness Jenna Contreras   PT Comments   Discussion had with pt and spouse, pt would benefit from going home  And tolerated steps. Encouraged pt to have a friend assist them  with getting into home due to the weather. Spouse agreed to call a friend. Pt now plans Dc today.  Follow Up Recommendations  Home health PT     Does the patient have the potential to tolerate intense rehabilitation     Barriers to Discharge        Equipment Recommendations  Rolling walker with 5" wheels;3in1 (PT)    Recommendations for Other Services    Frequency 7X/week   Progress towards PT Goals Progress towards PT goals: Progressing toward goals  Plan Discharge plan needs to be updated    Precautions / Restrictions Precautions Precautions: Fall Precaution Comments: monitor BP-low Restrictions Weight Bearing Restrictions: No   Pertinent Vitals/Pain Increased once when RW slipped on step.    Mobility  Bed Mobility Bed Mobility: Supine to Sit Supine to sit: HOB elevated;Min assist Sit to supine: Min assist General bed mobility comments: much increased time, pain, spouse present. to assist with bed mobility Transfers Overall transfer level: Needs assistance Equipment used: Rolling walker (2 wheeled) Transfers: Sit to/from Stand Sit to Stand: Min guard Stand pivot transfers: Min assist General transfer comment: cues for hand placement, Jenna LE management, safety overall. Appears to easily forget instructions Ambulation/Gait Ambulation/Gait assistance: Min assist Ambulation Distance (Feet): 25 Feet Assistive device: Rolling walker (2 wheeled) Gait Pattern/deviations: Step-to pattern General Gait Details: pt placing weight on RLE , not painful. Stairs: Yes Stairs  assistance: Mod assist Stair Management: Backwards;With walker;Step to pattern Number of Stairs: 2 General stair comments: spouse present to assist pt, practiced x 2    Exercises Total Joint Exercises Quad Sets: AROM;Right;10 reps;Supine Heel Slides: AAROM;Right;5 reps;Supine Exercises have been limited due to pt's pain and edema, and the extra  Time to accomplish functional mobility. A copy of exercises provide.   PT Diagnosis:    PT Problem List:   PT Treatment Interventions:     PT Goals (current goals can now be found in the care plan section)    Visit Information  Last PT Received On: 05/11/13 Assistance Needed: +1 History of Present Illness: Jenna Contreras    Subjective Data      Cognition  Cognition Arousal/Alertness: Awake/alert Behavior During Therapy: WFL for tasks assessed/performed Overall Cognitive Status: Within Functional Limits for tasks assessed    Balance     End of Session PT - End of Session Activity Tolerance: Patient tolerated treatment well Patient left: in bed;with call bell/phone within reach;with family/visitor present Nurse Communication: Mobility status   GP     Jenna Contreras 05/11/2013, 2:17 PM

## 2013-05-11 NOTE — Progress Notes (Signed)
Occupational Therapy Treatment Patient Details Name: Jenna Contreras MRN: [id number] DOB: [birth year] Today's Date: 05/11/2013 Time: 7829-5621 OT Time Calculation (min): 45 min  OT Assessment / Plan / Recommendation  History of present illness R DATHA   OT comments  Pt limited by pain (had just had pain meds prior to session) but is also very anxious about all functional mobility and OOB activity. She repeatedly stated during session that she doesn't feel she can go home today. She is wanting to think about still going to rehab but wants to see how she does with PT session. She may benefit from SNF to build confidence with ADL and safety before returning home.   Follow Up Recommendations  SNF;Other (comment);Supervision/Assistance - 24 hour (may need to consider SNF if she isnt able to progress) IF d/c home will definitely need HHOT.    Barriers to Discharge       Equipment Recommendations  3 in 1 bedside comode    Recommendations for Other Services    Frequency Min 2X/week   Progress towards OT Goals Progress towards OT goals: Progressing toward goals (very slow)  Plan Discharge plan needs to be updated    Precautions / Restrictions Precautions Precautions: Fall Precaution Comments: monitor BP-low Restrictions Weight Bearing Restrictions: No   Pertinent Vitals/Pain 10/10 at rest and with activity; reposition, ice    ADL  Lower Body Dressing: Performed;Minimal assistance;Moderate assistance (with reacher to doff pants, don new pants, sock aid) Where Assessed - Lower Body Dressing: Supported sit to stand Toilet Transfer: Performed;Minimal Manufacturing systems engineer: Raised toilet seat with arms (or 3-in-1 over toilet) Toileting - Clothing Manipulation and Hygiene: Performed;Minimal assistance Where Assessed - Best boy and Hygiene: Sit to stand from 3-in-1 or toilet Equipment Used: Rolling walker;Long-handled shoe horn;Long-handled  sponge;Reacher;Sock aid ADL Comments: Demonstrated all AE for pt and she is interested in possibly obtaining AE kit. Left written information regarding where to obtain kit and cost for pt in room. Pt practiced wtih donning pants with reacher, doffing with reacher and using sock aid to don sock. Pt lmiited by 10/10 pain but had just had pain meds at start of session. She is moving very very slowly and is having much difficulty picking up R LE off the floor. She is anxious and concerned about being able to go home. Expressed concenrs to nursing and PA that pt may want to consider SNF. Pt states she doesnt feel she can go home.     OT Diagnosis:    OT Problem List:   OT Treatment Interventions:     OT Goals(current goals can now be found in the care plan section)    Visit Information  Last OT Received On: 05/11/13 Assistance Needed: +1 History of Present Illness: R DATHA    Subjective Data      Prior Functioning       Cognition  Cognition Arousal/Alertness: Awake/alert Behavior During Therapy: Anxious Overall Cognitive Status: Within Functional Limits for tasks assessed    Mobility  Bed Mobility Bed Mobility: Supine to Sit Supine to sit: HOB elevated;Min assist (with leg lifter) Sit to supine: Min assist;HOB elevated (with leg lifter) General bed mobility comments: much increased time, pain Transfers Overall transfer level: Needs assistance Equipment used: Rolling walker (2 wheeled) Transfers: Sit to/from Stand Sit to Stand: Min assist Stand pivot transfers: Min assist General transfer comment: cues for hand placement, R LE management, safety overall. Appears to easily forget instructions    Exercises  Balance    End of Session OT - End of Session Equipment Utilized During Treatment: Rolling walker Activity Tolerance: Patient limited by pain Patient left: in bed;with call bell/phone within reach Nurse Communication: Mobility status  GO     Jules Schick [phone] 05/11/2013, 11:27 AM

## 2013-05-12 DIAGNOSIS — Z96649 Presence of unspecified artificial hip joint: Secondary | ICD-10-CM | POA: Diagnosis not present

## 2013-05-12 DIAGNOSIS — F3289 Other specified depressive episodes: Secondary | ICD-10-CM | POA: Diagnosis not present

## 2013-05-12 DIAGNOSIS — F319 Bipolar disorder, unspecified: Secondary | ICD-10-CM | POA: Diagnosis not present

## 2013-05-12 DIAGNOSIS — M4120 Other idiopathic scoliosis, site unspecified: Secondary | ICD-10-CM | POA: Diagnosis not present

## 2013-05-12 DIAGNOSIS — Z471 Aftercare following joint replacement surgery: Secondary | ICD-10-CM | POA: Diagnosis not present

## 2013-05-12 DIAGNOSIS — IMO0001 Reserved for inherently not codable concepts without codable children: Secondary | ICD-10-CM | POA: Diagnosis not present

## 2013-05-12 NOTE — Discharge Summary (Signed)
Physician Discharge Summary  Patient ID: Jenna Contreras MRN: [id number] DOB/AGE: 08/19/1945 68 y.o.  Admit date: 05/09/2013 Discharge date: 05/11/2013   Procedures:  Procedure(s) (LRB): RIGHT TOTAL HIP ARTHROPLASTY ANTERIOR APPROACH (Right)  Attending Physician:  Dr. Paralee Cancel   Admission Diagnoses:   Right hip OA / pain  Discharge Diagnoses:  Principal Problem:   S/P right THA, AA Active Problems:   Expected blood loss anemia   Overweight (BMI 25.0-29.9)  Past Medical History  Diagnosis Date  . Scoliosis   . Migraines   . Arthritis   . Hypothyroidism   . Asthma   . Fibromyalgia   . Depression   . OCD (obsessive compulsive disorder)   . Arthritis   . Spinal pain   . Cancer   . Complication of anesthesia   . Osteoporosis   . Psoriasis   . History of skin cancer   . Constipation   . GERD (gastroesophageal reflux disease)   . Anxiety   . Bipolar 2 disorder     HPI: Jenna Contreras, 68 y.o. female, has a history of pain and functional disability in the right hip(s) due to arthritis and patient has failed non-surgical conservative treatments for greater than 12 weeks to include NSAID's and/or analgesics and activity modification. Onset of symptoms was abrupt starting 5 months ago with rapidlly worsening course since that time.The patient noted no past surgery on the right hip(s). Patient currently rates pain in the right hip at 9 out of 10 with activity. Patient has night pain, worsening of pain with activity and weight bearing, trendelenberg gait, pain that interfers with activities of daily living and pain with passive range of motion. Patient has evidence of periarticular osteophytes and joint space narrowing by imaging studies. This condition presents safety issues increasing the risk of falls. There is no current active infection. Risks, benefits and expectations were discussed with the patient. Risks including but not limited to the risk of anesthesia,  blood clots, nerve damage, blood vessel damage, failure of the prosthesis, infection and up to and including death. Patient understand the risks, benefits and expectations and wishes to proceed with surgery.  PCP: Sallee Lange, MD   Discharged Condition: good  Hospital Course:  Patient underwent the above stated procedure on 05/09/2013. Patient tolerated the procedure well and brought to the recovery room in good condition and subsequently to the floor.  POD #1 BP: 82/55 ; Pulse: 75 ; Temp: 99.1 F (37.3 C) ; Resp: 16  Patient reports pain as mild, pain controlled. Resting in bed. No events throughout the night. Neurovascular intact, dorsiflexion/plantar flexion intact, incision: dressing C/D/I, no cellulitis present and compartment soft.   LABS  Basename    HGB  8.9  HCT  27.4   POD #2  BP: 100/62 ; Pulse: 88 ; Temp: 99.6 F (37.6 C) ; Resp: 16  Seen by Dr. Alvan Dame. Patient reports pain as mild, controlled with medication. No events throughout the night. Doing well with PT. Ready to be discharged home. Neurovascular intact, dorsiflexion/plantar flexion intact, incision: dressing C/D/I, no cellulitis present and compartment soft.   LABS  Basename    HGB  9.1  HCT  27.6    Discharge Exam: General appearance: alert, cooperative and no distress Extremities: Homans sign is negative, no sign of DVT, no edema, redness or tenderness in the calves or thighs and no ulcers, gangrene or trophic changes  Disposition:     Home-Health Care S[facility] with follow up in 2 weeks  Follow-up Information   Follow up with Mauri Pole, MD. Schedule an appointment as soon as possible for a visit in 2 weeks.   Specialty:  Orthopedic Surgery   Contact information:   [address] [place] [address] [phone]       Discharge Orders   Future Orders Complete By Expires   Call MD / Call 911  As directed    Comments:     If you experience chest pain or shortness of breath, CALL  911 and be transported to the hospital emergency room.  If you develope a fever above 101 F, pus (white drainage) or increased drainage or redness at the wound, or calf pain, call your surgeon's office.   Change dressing  As directed    Comments:     Maintain surgical dressing for 10-14 days, or until follow up in the clinic.   Constipation Prevention  As directed    Comments:     Drink plenty of fluids.  Prune juice may be helpful.  You may use a stool softener, such as Colace (over the counter) 100 mg twice a day.  Use MiraLax (over the counter) for constipation as needed.   Diet - low sodium heart healthy  As directed    Discharge instructions  As directed    Comments:     Maintain surgical dressing for 10-14 days, or until follow up in the clinic. Follow up in 2 weeks at [facility]. Call with any questions or concerns.   Increase activity slowly as tolerated  As directed    TED hose  As directed    Comments:     Use stockings (TED hose) for 2 weeks on both leg(s).  You may remove them at night for sleeping.   Weight bearing as tolerated  As directed    Questions:     Laterality:     Extremity:          Medication List    STOP taking these medications       Diclofenac Sodium CR 100 MG 24 hr tablet     HYDROcodone-acetaminophen 10-325 MG per tablet  Commonly known as:  NORCO      TAKE these medications       albuterol 108 (90 BASE) MCG/ACT inhaler  Commonly known as:  PROVENTIL HFA;VENTOLIN HFA  Inhale 1-2 puffs into the lungs every 6 (six) hours as needed for wheezing.     aspirin EC 325 MG tablet  Take 1 tablet (325 mg total) by mouth 2 (two) times daily. Take for 4 weeks.     cyclobenzaprine 10 MG tablet  Commonly known as:  FLEXERIL  Take 1 tablet (10 mg total) by mouth 2 (two) times daily as needed for muscle spasms.     DSS 100 MG Caps  Take 100 mg by mouth 2 (two) times daily.     escitalopram 10 MG tablet  Commonly known as:  LEXAPRO  Take 10  mg by mouth every morning.     fentaNYL 50 MCG/HR  Commonly known as:  DURAGESIC  Place 1 patch (50 mcg total) onto the skin every 3 (three) days.     ferrous sulfate 325 (65 FE) MG tablet  Take 1 tablet (325 mg total) by mouth 3 (three) times daily after meals.     fluticasone 50 MCG/ACT nasal spray  Commonly known as:  FLONASE  Place 2 sprays into both nostrils daily.     lamoTRIgine 100 MG tablet  Commonly known as:  LAMICTAL  Take 50 mg by mouth every morning. Takes 1/2 tablet     levothyroxine 100 MCG tablet  Commonly known as:  SYNTHROID, LEVOTHROID  Take 100 mcg by mouth daily before breakfast.     LORazepam 1 MG tablet  Commonly known as:  ATIVAN  Take 1 mg by mouth at bedtime.     ondansetron 8 MG tablet  Commonly known as:  ZOFRAN  Take by mouth every 8 (eight) hours as needed for nausea.     oxyCODONE 5 MG immediate release tablet  Commonly known as:  Oxy IR/ROXICODONE  Take 1-2 tablets (5-10 mg total) by mouth every 4 (four) hours as needed for breakthrough pain.     polyethylene glycol packet  Commonly known as:  MIRALAX / GLYCOLAX  Take 17 g by mouth daily as needed for mild constipation.     rivaroxaban 10 MG Tabs tablet  Commonly known as:  XARELTO  Take 1 tablet (10 mg total) by mouth daily with breakfast.     rizatriptan 10 MG tablet  Commonly known as:  MAXALT  Take 1 tablet (10 mg total) by mouth once as needed for migraine. May repeat in 2 hours if needed         Signed: [place]. [place]   PAC  05/12/2013, 10:17 AM

## 2013-05-22 ENCOUNTER — Ambulatory Visit: Payer: Medicare Other | Admitting: Family Medicine

## 2013-05-24 ENCOUNTER — Telehealth: Payer: Self-pay | Admitting: *Deleted

## 2013-05-24 NOTE — Telephone Encounter (Signed)
PT with [facility] called today to report blood pressure lying 140/84 and standing 88/50. Pt having dizziness with standing. Physical therapist states she is not drinking much. Hb low after surgery for hip replacement. She started taking iron today.

## 2013-05-24 NOTE — Telephone Encounter (Signed)
I spoke with a physical therapist I recommended for her to increase her fluid intake plus also start eating better. If she continues to have orthostasis she needs evaluation either here or emergency department. I also recommended to minimize the use of pain medication.

## 2013-05-31 ENCOUNTER — Ambulatory Visit: Payer: Medicare Other | Admitting: Internal Medicine

## 2013-06-06 DIAGNOSIS — R262 Difficulty in walking, not elsewhere classified: Secondary | ICD-10-CM | POA: Diagnosis not present

## 2013-06-06 DIAGNOSIS — M6281 Muscle weakness (generalized): Secondary | ICD-10-CM | POA: Diagnosis not present

## 2013-06-06 DIAGNOSIS — M25559 Pain in unspecified hip: Secondary | ICD-10-CM | POA: Diagnosis not present

## 2013-06-07 DIAGNOSIS — M6281 Muscle weakness (generalized): Secondary | ICD-10-CM | POA: Diagnosis not present

## 2013-06-07 DIAGNOSIS — R262 Difficulty in walking, not elsewhere classified: Secondary | ICD-10-CM | POA: Diagnosis not present

## 2013-06-07 DIAGNOSIS — M25559 Pain in unspecified hip: Secondary | ICD-10-CM | POA: Diagnosis not present

## 2013-06-08 DIAGNOSIS — M6281 Muscle weakness (generalized): Secondary | ICD-10-CM | POA: Diagnosis not present

## 2013-06-08 DIAGNOSIS — R262 Difficulty in walking, not elsewhere classified: Secondary | ICD-10-CM | POA: Diagnosis not present

## 2013-06-08 DIAGNOSIS — M25559 Pain in unspecified hip: Secondary | ICD-10-CM | POA: Diagnosis not present

## 2013-06-12 DIAGNOSIS — R262 Difficulty in walking, not elsewhere classified: Secondary | ICD-10-CM | POA: Diagnosis not present

## 2013-06-12 DIAGNOSIS — M25559 Pain in unspecified hip: Secondary | ICD-10-CM | POA: Diagnosis not present

## 2013-06-12 DIAGNOSIS — M6281 Muscle weakness (generalized): Secondary | ICD-10-CM | POA: Diagnosis not present

## 2013-06-13 DIAGNOSIS — M6281 Muscle weakness (generalized): Secondary | ICD-10-CM | POA: Diagnosis not present

## 2013-06-13 DIAGNOSIS — R262 Difficulty in walking, not elsewhere classified: Secondary | ICD-10-CM | POA: Diagnosis not present

## 2013-06-13 DIAGNOSIS — M25559 Pain in unspecified hip: Secondary | ICD-10-CM | POA: Diagnosis not present

## 2013-06-14 DIAGNOSIS — M6281 Muscle weakness (generalized): Secondary | ICD-10-CM | POA: Diagnosis not present

## 2013-06-14 DIAGNOSIS — R262 Difficulty in walking, not elsewhere classified: Secondary | ICD-10-CM | POA: Diagnosis not present

## 2013-06-14 DIAGNOSIS — M25559 Pain in unspecified hip: Secondary | ICD-10-CM | POA: Diagnosis not present

## 2013-06-19 DIAGNOSIS — M6281 Muscle weakness (generalized): Secondary | ICD-10-CM | POA: Diagnosis not present

## 2013-06-19 DIAGNOSIS — R262 Difficulty in walking, not elsewhere classified: Secondary | ICD-10-CM | POA: Diagnosis not present

## 2013-06-19 DIAGNOSIS — M25559 Pain in unspecified hip: Secondary | ICD-10-CM | POA: Diagnosis not present

## 2013-06-20 DIAGNOSIS — M6281 Muscle weakness (generalized): Secondary | ICD-10-CM | POA: Diagnosis not present

## 2013-06-20 DIAGNOSIS — M25559 Pain in unspecified hip: Secondary | ICD-10-CM | POA: Diagnosis not present

## 2013-06-20 DIAGNOSIS — R262 Difficulty in walking, not elsewhere classified: Secondary | ICD-10-CM | POA: Diagnosis not present

## 2013-06-21 DIAGNOSIS — R262 Difficulty in walking, not elsewhere classified: Secondary | ICD-10-CM | POA: Diagnosis not present

## 2013-06-21 DIAGNOSIS — M6281 Muscle weakness (generalized): Secondary | ICD-10-CM | POA: Diagnosis not present

## 2013-06-21 DIAGNOSIS — M25559 Pain in unspecified hip: Secondary | ICD-10-CM | POA: Diagnosis not present

## 2013-06-23 DIAGNOSIS — Z9660 Presence of unspecified orthopedic joint implant: Secondary | ICD-10-CM | POA: Diagnosis not present

## 2013-07-04 ENCOUNTER — Telehealth: Payer: Self-pay | Admitting: Family Medicine

## 2013-07-04 NOTE — Telephone Encounter (Signed)
Patient recently had hip surgery and she thinks it is URGENT for her to be seen tomorrow.  #1 She wants to follow up on her hip surgery #2 Discuss pain medication #3 Refill all her medications #4 Discuss her fatty liver disease. She said she can only come tomorrow at 2:30 pm and if she can't be seen tomorrow then she wants to be seen Thursday for all of this. She apparently is not having any problems from what she described to me, but she thinks its VERY important she is seen tomorrow.  I told her that we had appointments open only for that those are sick tomorrow and that we may not be able to do this, and she didn't think that was good enough.

## 2013-07-04 NOTE — Telephone Encounter (Signed)
Nurses please call this patient. If she is in fact needing an office visit after a nursing 3 I the office visit needs to be focused on a specific aspect we cannot cover all of these issues in one visit given that we only have urgent care visits available today and tomorrow. Discuss with the patient and get back with me thank you

## 2013-07-05 ENCOUNTER — Telehealth: Payer: Self-pay | Admitting: Family Medicine

## 2013-07-05 NOTE — Telephone Encounter (Signed)
The fentanyl patch may be refilled 50 mcg patch apply every 3 days. #10. When I see her I can go ahead and prescribe additional month's supply. Given that this is a schedule 2 drug each prescription is for 30 days.

## 2013-07-05 NOTE — Telephone Encounter (Signed)
Patient with multiple concerns-consult with Dr. Nicki Reaper- Dr Nicki Reaper advised to tell patient to contact surgeon concerning issues s/p hip surgery and if she feels she is in danger contact [facility] or behavioral health-patient given office visit for Tuesday advised to go to ER if worse.

## 2013-07-05 NOTE — Telephone Encounter (Signed)
Also received #80 Oxycodone 5mg  from ortho 06/16/13

## 2013-07-05 NOTE — Telephone Encounter (Signed)
Pt requesting refill on fentaNYL (DURAGESIC) 50 MCG/HR please send to [facility]  States she's currently on bed rest post hip surgery, has had a fall and PT, patch Rx is about to run out and she doesn't want to experience withdrawals, once hip surgery recovery, will have bilat knee surgery, after knees will possibly have back surgery, then plans to see pain management specialist States she's not currently taking any Percocet or Vicodin due to inactivity, but stopping the patch could cause such withdrawals she would like refill.   Currently living with a friend due to separation from her husband, unable to drive, please call patient when done

## 2013-07-06 MED ORDER — FENTANYL 50 MCG/HR TD PT72: 50.0000 ug | MEDICATED_PATCH | TRANSDERMAL | Status: DC

## 2013-07-06 NOTE — Telephone Encounter (Signed)
Left message on voicemail notifying patient that script is ready for pickup.  

## 2013-07-11 ENCOUNTER — Ambulatory Visit (INDEPENDENT_AMBULATORY_CARE_PROVIDER_SITE_OTHER): Payer: Medicare Other | Admitting: Family Medicine

## 2013-07-11 ENCOUNTER — Encounter: Payer: Self-pay | Admitting: Family Medicine

## 2013-07-11 VITALS — BP 118/82 | Ht 61.0 in | Wt 133.0 lb

## 2013-07-11 DIAGNOSIS — E039 Hypothyroidism, unspecified: Secondary | ICD-10-CM

## 2013-07-11 DIAGNOSIS — D649 Anemia, unspecified: Secondary | ICD-10-CM | POA: Diagnosis not present

## 2013-07-11 DIAGNOSIS — R7402 Elevation of levels of lactic acid dehydrogenase (LDH): Secondary | ICD-10-CM | POA: Diagnosis not present

## 2013-07-11 DIAGNOSIS — E876 Hypokalemia: Secondary | ICD-10-CM

## 2013-07-11 DIAGNOSIS — R740 Nonspecific elevation of levels of transaminase and lactic acid dehydrogenase [LDH]: Secondary | ICD-10-CM

## 2013-07-11 DIAGNOSIS — R7401 Elevation of levels of liver transaminase levels: Secondary | ICD-10-CM

## 2013-07-11 LAB — CBC WITH DIFFERENTIAL/PLATELET
BASOS ABS: 0.1 10*3/uL (ref 0.0–0.1)
Basophils Relative: 1 % (ref 0–1)
EOS ABS: 0.3 10*3/uL (ref 0.0–0.7)
Eosinophils Relative: 4 % (ref 0–5)
HEMATOCRIT: 39.6 % (ref 36.0–46.0)
Hemoglobin: 13 g/dL (ref 12.0–15.0)
Lymphocytes Relative: 48 % — ABNORMAL HIGH (ref 12–46)
Lymphs Abs: 4.2 10*3/uL — ABNORMAL HIGH (ref 0.7–4.0)
MCH: 27.4 pg (ref 26.0–34.0)
MCHC: 32.8 g/dL (ref 30.0–36.0)
MCV: 83.5 fL (ref 78.0–100.0)
MONO ABS: 0.6 10*3/uL (ref 0.1–1.0)
Monocytes Relative: 7 % (ref 3–12)
Neutro Abs: 3.5 10*3/uL (ref 1.7–7.7)
Neutrophils Relative %: 40 % — ABNORMAL LOW (ref 43–77)
PLATELETS: 300 10*3/uL (ref 150–400)
RBC: 4.74 MIL/uL (ref 3.87–5.11)
RDW: 14.5 % (ref 11.5–15.5)
WBC: 8.7 10*3/uL (ref 4.0–10.5)

## 2013-07-11 LAB — BASIC METABOLIC PANEL
BUN: 12 mg/dL (ref 6–23)
CHLORIDE: 103 meq/L (ref 96–112)
CO2: 25 mEq/L (ref 19–32)
CREATININE: 0.58 mg/dL (ref 0.50–1.10)
Calcium: 9.1 mg/dL (ref 8.4–10.5)
Glucose, Bld: 96 mg/dL (ref 70–99)
Potassium: 4 mEq/L (ref 3.5–5.3)
Sodium: 139 mEq/L (ref 135–145)

## 2013-07-11 LAB — HEPATIC FUNCTION PANEL
ALK PHOS: 95 U/L (ref 39–117)
ALT: 15 U/L (ref 0–35)
AST: 20 U/L (ref 0–37)
Albumin: 3.7 g/dL (ref 3.5–5.2)
Bilirubin, Direct: <0.1 mg/dL (ref 0.0–0.3)
Indirect Bilirubin: 0.3 mg/dL (ref 0.2–1.2)
Total Bilirubin: 0.3 mg/dL (ref 0.3–1.2)
Total Protein: 6.8 g/dL (ref 6.0–8.3)

## 2013-07-11 MED ORDER — RIZATRIPTAN BENZOATE 10 MG PO TABS
10.0000 mg | ORAL_TABLET | Freq: Once | ORAL | Status: DC | PRN
Start: 2013-07-11 — End: 2013-07-11

## 2013-07-11 MED ORDER — CYCLOBENZAPRINE HCL 10 MG PO TABS: 10.0000 mg | ORAL_TABLET | Freq: Two times a day (BID) | ORAL | Status: DC | PRN

## 2013-07-11 MED ORDER — LEVOTHYROXINE SODIUM 100 MCG PO TABS: 100.0000 ug | ORAL_TABLET | Freq: Every day | ORAL | Status: DC

## 2013-07-11 MED ORDER — RIZATRIPTAN BENZOATE 10 MG PO TABS: 10.0000 mg | ORAL_TABLET | Freq: Once | ORAL | Status: DC | PRN
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
## 2013-07-12 LAB — FERRITIN: Ferritin: 15 ng/mL (ref 10–291)

## 2013-08-02 ENCOUNTER — Encounter: Payer: Self-pay | Admitting: Family Medicine

## 2013-08-02 ENCOUNTER — Ambulatory Visit (INDEPENDENT_AMBULATORY_CARE_PROVIDER_SITE_OTHER): Payer: Medicare Other | Admitting: Family Medicine

## 2013-08-02 VITALS — BP 112/64 | Temp 98.2°F | Ht 61.5 in | Wt 132.0 lb

## 2013-08-02 DIAGNOSIS — I951 Orthostatic hypotension: Secondary | ICD-10-CM

## 2013-08-02 DIAGNOSIS — N390 Urinary tract infection, site not specified: Secondary | ICD-10-CM | POA: Diagnosis not present

## 2013-08-02 MED ORDER — ESCITALOPRAM OXALATE 10 MG PO TABS: 10.0000 mg | ORAL_TABLET | Freq: Every day | ORAL | Status: DC

## 2013-08-02 MED ORDER — SULFAMETHOXAZOLE-TMP DS 800-160 MG PO TABS: 1.0000 | ORAL_TABLET | Freq: Two times a day (BID) | ORAL | Status: DC

## 2013-08-02 MED ORDER — DICLOFENAC SODIUM 75 MG PO TBEC: 75.0000 mg | DELAYED_RELEASE_TABLET | Freq: Two times a day (BID) | ORAL | Status: DC

## 2013-08-02 NOTE — Progress Notes (Signed)
   Subjective:    Patient ID: Jenna Contreras, female    DOB: 01/13/1946, 68 y.o.   MRN: [id number]  Dysuria  This is a new problem. The current episode started in the past 7 days. Maximum temperature: low grade fever. Associated symptoms include flank pain and urgency. Treatments tried: azo.   Requesting refill on lexapro. [facility]    Problems with blood pressure being low. 96/38 one day.  Feeling dizzy. BP lying 112/64. Sitting 100/68, and lying 90/60 Review of Systems  Constitutional: Negative for fever, diaphoresis and activity change.  HENT: Negative for congestion.   Respiratory: Negative for cough.   Cardiovascular: Negative for chest pain.  Gastrointestinal: Negative for abdominal pain.  Genitourinary: Positive for dysuria, urgency and flank pain.  Neurological: Positive for dizziness.       Objective:   Physical Exam  Patient has orthostatic blood pressure does drop from laying sitting to standing lungs clear heart regular lower abdominal tenderness back nontender      Assessment & Plan:  #1 UTI DS twice a day 7 days, culture taken #2 orthostatic hypotension possible adrenal insufficiency recommended testing may need endocrinology consult

## 2013-08-03 DIAGNOSIS — Z9660 Presence of unspecified orthopedic joint implant: Secondary | ICD-10-CM | POA: Diagnosis not present

## 2013-08-03 DIAGNOSIS — M129 Arthropathy, unspecified: Secondary | ICD-10-CM | POA: Diagnosis not present

## 2013-08-04 LAB — CORTISOL: CORTISOL PLASMA: 14.9 ug/dL

## 2013-08-05 LAB — URINE CULTURE

## 2013-08-07 LAB — ACTH: C206 ACTH: 15 pg/mL (ref 6–50)

## 2013-08-09 NOTE — Progress Notes (Signed)
Pt notified and verbalized understanding. She said she would call us and let us know if her symptoms come back.  

## 2013-08-09 NOTE — Progress Notes (Signed)
Pt notified and verbalized understanding. She said she would call us and let us know if her symptoms come back.

## 2013-09-20 DIAGNOSIS — Z9660 Presence of unspecified orthopedic joint implant: Secondary | ICD-10-CM | POA: Diagnosis not present

## 2013-09-20 DIAGNOSIS — M76899 Other specified enthesopathies of unspecified lower limb, excluding foot: Secondary | ICD-10-CM | POA: Diagnosis not present

## 2013-09-20 DIAGNOSIS — M1710 Unilateral primary osteoarthritis, unspecified knee: Secondary | ICD-10-CM | POA: Diagnosis not present

## 2013-10-12 ENCOUNTER — Ambulatory Visit: Payer: Medicare Other | Admitting: Family Medicine

## 2013-10-13 ENCOUNTER — Ambulatory Visit (INDEPENDENT_AMBULATORY_CARE_PROVIDER_SITE_OTHER): Payer: Medicare Other | Admitting: Family Medicine

## 2013-10-13 ENCOUNTER — Encounter: Payer: Self-pay | Admitting: Family Medicine

## 2013-10-13 VITALS — BP 116/68 | Ht 61.5 in | Wt 128.5 lb

## 2013-10-13 DIAGNOSIS — G8929 Other chronic pain: Secondary | ICD-10-CM | POA: Diagnosis not present

## 2013-10-13 DIAGNOSIS — M549 Dorsalgia, unspecified: Secondary | ICD-10-CM | POA: Diagnosis not present

## 2013-10-13 DIAGNOSIS — E038 Other specified hypothyroidism: Secondary | ICD-10-CM

## 2013-10-13 MED ORDER — FENTANYL 50 MCG/HR TD PT72: 50.0000 ug | MEDICATED_PATCH | TRANSDERMAL | Status: DC

## 2013-10-13 MED ORDER — LAMOTRIGINE 100 MG PO TABS: 100.0000 mg | ORAL_TABLET | Freq: Every day | ORAL | Status: DC

## 2013-10-13 MED ORDER — ESCITALOPRAM OXALATE 10 MG PO TABS: 10.0000 mg | ORAL_TABLET | Freq: Every day | ORAL | Status: DC

## 2013-10-13 NOTE — Progress Notes (Signed)
   Subjective:    Patient ID: Jenna Contreras, female    DOB: 1945/10/02, 69 y.o.   MRN: [id number]  HPI Patient is here today for follow up visit on hypothyroidism/ med check. Patient states she is in more pain now than she was before her hip replacement. Patient has been using ice therapy with no relief.  Patient states that she has no other concerns at this time.  Patient relates that she is having significant hip pain as well as knee pain she expects to see her orthopedist in the coming weeks she recently had surgery on her head she states her KM socket in the orthopedist is showed her some simple things she could do she feels that more needs to be done I encouraged her to talk with her orthopedist regarding all of this. She states the pain medicine is not adequately taking care of her pain but it seems to help more than not taking  Review of Systems  Constitutional: Negative for activity change, appetite change and fatigue.  Gastrointestinal: Negative for abdominal pain.  Neurological: Negative for headaches.  Psychiatric/Behavioral: Negative for behavioral problems.       Objective:   Physical Exam  Vitals reviewed. Constitutional: She appears well-nourished. No distress.  HENT:  Head: Normocephalic.  Cardiovascular: Normal rate, regular rhythm and normal heart sounds.   No murmur heard. Pulmonary/Chest: Effort normal and breath sounds normal.  Musculoskeletal: She exhibits no edema.  Lymphadenopathy:    She has no cervical adenopathy.  Neurological: She is alert.  Psychiatric: Her behavior is normal.          Assessment & Plan:  #1 hypothyroidism continue medication later this year need to check lab work #2 chronic pain refills on her medications were given #3 her hip does not seem to be out of socket on physical examination she does have subjective bilateral knee pain I encouraged her to see her orthopedist regarding this. Patient is going through some  significant stressors separated from her husband but she seems to be handling this well Followup 3 months

## 2013-10-24 ENCOUNTER — Emergency Department (HOSPITAL_COMMUNITY): Payer: Medicare Other

## 2013-10-24 ENCOUNTER — Encounter (HOSPITAL_COMMUNITY): Payer: Self-pay | Admitting: Emergency Medicine

## 2013-10-24 ENCOUNTER — Emergency Department (HOSPITAL_COMMUNITY)
Admission: EM | Admit: 2013-10-24 | Discharge: 2013-10-25 | Disposition: A | Discharge status: Home / Self Care | Payer: Medicare Other | Attending: Emergency Medicine | Admitting: Emergency Medicine

## 2013-10-24 DIAGNOSIS — J45909 Unspecified asthma, uncomplicated: Secondary | ICD-10-CM | POA: Diagnosis not present

## 2013-10-24 DIAGNOSIS — R1011 Right upper quadrant pain: Secondary | ICD-10-CM | POA: Diagnosis not present

## 2013-10-24 DIAGNOSIS — IMO0002 Reserved for concepts with insufficient information to code with codable children: Secondary | ICD-10-CM | POA: Diagnosis not present

## 2013-10-24 DIAGNOSIS — Z91040 Latex allergy status: Secondary | ICD-10-CM | POA: Insufficient documentation

## 2013-10-24 DIAGNOSIS — G43909 Migraine, unspecified, not intractable, without status migrainosus: Secondary | ICD-10-CM | POA: Insufficient documentation

## 2013-10-24 DIAGNOSIS — K5900 Constipation, unspecified: Secondary | ICD-10-CM | POA: Insufficient documentation

## 2013-10-24 DIAGNOSIS — Z880 Allergy status to penicillin: Secondary | ICD-10-CM | POA: Insufficient documentation

## 2013-10-24 DIAGNOSIS — Z872 Personal history of diseases of the skin and subcutaneous tissue: Secondary | ICD-10-CM | POA: Insufficient documentation

## 2013-10-24 DIAGNOSIS — F3189 Other bipolar disorder: Secondary | ICD-10-CM | POA: Diagnosis not present

## 2013-10-24 DIAGNOSIS — M129 Arthropathy, unspecified: Secondary | ICD-10-CM | POA: Insufficient documentation

## 2013-10-24 DIAGNOSIS — E039 Hypothyroidism, unspecified: Secondary | ICD-10-CM | POA: Insufficient documentation

## 2013-10-24 DIAGNOSIS — R109 Unspecified abdominal pain: Secondary | ICD-10-CM

## 2013-10-24 DIAGNOSIS — Z87891 Personal history of nicotine dependence: Secondary | ICD-10-CM | POA: Insufficient documentation

## 2013-10-24 DIAGNOSIS — Z79899 Other long term (current) drug therapy: Secondary | ICD-10-CM | POA: Diagnosis not present

## 2013-10-24 DIAGNOSIS — F411 Generalized anxiety disorder: Secondary | ICD-10-CM | POA: Insufficient documentation

## 2013-10-24 DIAGNOSIS — Z85828 Personal history of other malignant neoplasm of skin: Secondary | ICD-10-CM | POA: Insufficient documentation

## 2013-10-24 DIAGNOSIS — R1031 Right lower quadrant pain: Secondary | ICD-10-CM | POA: Diagnosis present

## 2013-10-24 DIAGNOSIS — F429 Obsessive-compulsive disorder, unspecified: Secondary | ICD-10-CM | POA: Diagnosis not present

## 2013-10-24 LAB — CBC WITH DIFFERENTIAL/PLATELET
BASOS ABS: 0.1 10*3/uL (ref 0.0–0.1)
Basophils Relative: 1 % (ref 0–1)
Eosinophils Absolute: 0.2 10*3/uL (ref 0.0–0.7)
Eosinophils Relative: 3 % (ref 0–5)
HEMATOCRIT: 41.5 % (ref 36.0–46.0)
HEMOGLOBIN: 13.6 g/dL (ref 12.0–15.0)
LYMPHS PCT: 49 % — AB (ref 12–46)
Lymphs Abs: 3.1 10*3/uL (ref 0.7–4.0)
MCH: 28.3 pg (ref 26.0–34.0)
MCHC: 32.8 g/dL (ref 30.0–36.0)
MCV: 86.3 fL (ref 78.0–100.0)
MONO ABS: 0.4 10*3/uL (ref 0.1–1.0)
MONOS PCT: 6 % (ref 3–12)
NEUTROS ABS: 2.5 10*3/uL (ref 1.7–7.7)
Neutrophils Relative %: 41 % — ABNORMAL LOW (ref 43–77)
Platelets: 243 10*3/uL (ref 150–400)
RBC: 4.81 MIL/uL (ref 3.87–5.11)
RDW: 16.4 % — AB (ref 11.5–15.5)
WBC: 6.2 10*3/uL (ref 4.0–10.5)

## 2013-10-24 LAB — URINALYSIS, ROUTINE W REFLEX MICROSCOPIC
BILIRUBIN URINE: NEGATIVE
GLUCOSE, UA: NEGATIVE mg/dL
HGB URINE DIPSTICK: NEGATIVE
Ketones, ur: NEGATIVE mg/dL
Leukocytes, UA: NEGATIVE
Nitrite: NEGATIVE
PH: 5.5 (ref 5.0–8.0)
Protein, ur: NEGATIVE mg/dL
Specific Gravity, Urine: 1.015 (ref 1.005–1.030)
UROBILINOGEN UA: 0.2 mg/dL (ref 0.0–1.0)

## 2013-10-24 LAB — COMPREHENSIVE METABOLIC PANEL
ALBUMIN: 3.5 g/dL (ref 3.5–5.2)
ALK PHOS: 111 U/L (ref 39–117)
ALT: 30 U/L (ref 0–35)
AST: 27 U/L (ref 0–37)
Anion gap: 9 (ref 5–15)
BUN: 13 mg/dL (ref 6–23)
CALCIUM: 9.1 mg/dL (ref 8.4–10.5)
CO2: 30 mEq/L (ref 19–32)
Chloride: 103 mEq/L (ref 96–112)
Creatinine, Ser: 0.79 mg/dL (ref 0.50–1.10)
GFR calc non Af Amer: 83 mL/min — ABNORMAL LOW (ref >90)
GLUCOSE: 112 mg/dL — AB (ref 70–99)
Potassium: 3.9 mEq/L (ref 3.7–5.3)
SODIUM: 142 meq/L (ref 137–147)
TOTAL PROTEIN: 6.7 g/dL (ref 6.0–8.3)
Total Bilirubin: 0.4 mg/dL (ref 0.3–1.2)

## 2013-10-24 LAB — LIPASE, BLOOD: Lipase: 13 U/L (ref 11–59)

## 2013-10-24 MED ORDER — ONDANSETRON HCL 4 MG/2ML IJ SOLN
4.0000 mg | Freq: Once | INTRAMUSCULAR | Status: AC
  Administered 2013-10-24: 4 mg via INTRAVENOUS
  Filled 2013-10-24: qty 2

## 2013-10-24 MED ORDER — IOHEXOL 300 MG/ML  SOLN
50.0000 mL | Freq: Once | INTRAMUSCULAR | Status: AC | PRN
  Administered 2013-10-24: 50 mL via ORAL

## 2013-10-24 MED ORDER — MORPHINE SULFATE 4 MG/ML IJ SOLN
4.0000 mg | Freq: Once | INTRAMUSCULAR | Status: AC
Start: 2013-10-24 — End: 2013-10-24
  Administered 2013-10-24: 4 mg via INTRAVENOUS
  Filled 2013-10-24: qty 1

## 2013-10-24 MED ORDER — SODIUM CHLORIDE 0.9 % IV BOLUS (SEPSIS)
500.0000 mL | Freq: Once | INTRAVENOUS | Status: AC
  Administered 2013-10-24: 500 mL via INTRAVENOUS

## 2013-10-24 MED ORDER — IOHEXOL 300 MG/ML  SOLN
100.0000 mL | Freq: Once | INTRAMUSCULAR | Status: AC | PRN
  Administered 2013-10-24: 100 mL via INTRAVENOUS

## 2013-10-24 MED ORDER — SODIUM CHLORIDE 0.9 % IV SOLN
INTRAVENOUS | Status: DC
  Administered 2013-10-24: 21:00:00 via INTRAVENOUS

## 2013-10-24 MED ORDER — HYDROMORPHONE HCL PF 1 MG/ML IJ SOLN
1.0000 mg | Freq: Once | INTRAMUSCULAR | Status: AC
  Administered 2013-10-24: 1 mg via INTRAVENOUS
  Filled 2013-10-24: qty 1

## 2013-10-24 NOTE — Discharge Instructions (Signed)
Use your pain medicine, if needed. Use a stool softener, such as milk of magnesia, magnesium citrate, or MiraLax to encourage stooling.   Abdominal Pain, Women Abdominal (stomach, pelvic, or belly) pain can be caused by many things. It is important to tell your doctor:  The location of the pain.  Does it come and go or is it present all the time?  Are there things that start the pain (eating certain foods, exercise)?  Are there other symptoms associated with the pain (fever, nausea, vomiting, diarrhea)? All of this is helpful to know when trying to find the cause of the pain. CAUSES   Stomach: virus or bacteria infection, or ulcer.  Intestine: appendicitis (inflamed appendix), regional ileitis (Crohn's disease), ulcerative colitis (inflamed colon), irritable bowel syndrome, diverticulitis (inflamed diverticulum of the colon), or cancer of the stomach or intestine.  Gallbladder disease or stones in the gallbladder.  Kidney disease, kidney stones, or infection.  Pancreas infection or cancer.  Fibromyalgia (pain disorder).  Diseases of the female organs:  Uterus: fibroid (non-cancerous) tumors or infection.  Fallopian tubes: infection or tubal pregnancy.  Ovary: cysts or tumors.  Pelvic adhesions (scar tissue).  Endometriosis (uterus lining tissue growing in the pelvis and on the pelvic organs).  Pelvic congestion syndrome (female organs filling up with blood just before the menstrual period).  Pain with the menstrual period.  Pain with ovulation (producing an egg).  Pain with an IUD (intrauterine device, birth control) in the uterus.  Cancer of the female organs.  Functional pain (pain not caused by a disease, may improve without treatment).  Psychological pain.  Depression. DIAGNOSIS  Your doctor will decide the seriousness of your pain by doing an examination.  Blood tests.  X-rays.  Ultrasound.  CT scan (computed tomography, special type of  X-ray).  MRI (magnetic resonance imaging).  Cultures, for infection.  Barium enema (dye inserted in the large intestine, to better view it with X-rays).  Colonoscopy (looking in intestine with a lighted tube).  Laparoscopy (minor surgery, looking in abdomen with a lighted tube).  Major abdominal exploratory surgery (looking in abdomen with a large incision). TREATMENT  The treatment will depend on the cause of the pain.   Many cases can be observed and treated at home.  Over-the-counter medicines recommended by your caregiver.  Prescription medicine.  Antibiotics, for infection.  Birth control pills, for painful periods or for ovulation pain.  Hormone treatment, for endometriosis.  Nerve blocking injections.  Physical therapy.  Antidepressants.  Counseling with a psychologist or psychiatrist.  Minor or major surgery. HOME CARE INSTRUCTIONS   Do not take laxatives, unless directed by your caregiver.  Take over-the-counter pain medicine only if ordered by your caregiver. Do not take aspirin because it can cause an upset stomach or bleeding.  Try a clear liquid diet (broth or water) as ordered by your caregiver. Slowly move to a bland diet, as tolerated, if the pain is related to the stomach or intestine.  Have a thermometer and take your temperature several times a day, and record it.  Bed rest and sleep, if it helps the pain.  Avoid sexual intercourse, if it causes pain.  Avoid stressful situations.  Keep your follow-up appointments and tests, as your caregiver orders.  If the pain does not go away with medicine or surgery, you may try:  Acupuncture.  Relaxation exercises (yoga, meditation).  Group therapy.  Counseling. SEEK MEDICAL CARE IF:   You notice certain foods cause stomach pain.  Your home  care treatment is not helping your pain.  You need stronger pain medicine.  You want your IUD removed.  You feel faint or lightheaded.  You  develop nausea and vomiting.  You develop a rash.  You are having side effects or an allergy to your medicine. SEEK IMMEDIATE MEDICAL CARE IF:   Your pain does not go away or gets worse.  You have a fever.  Your pain is felt only in portions of the abdomen. The right side could possibly be appendicitis. The left lower portion of the abdomen could be colitis or diverticulitis.  You are passing blood in your stools (bright red or black tarry stools, with or without vomiting).  You have blood in your urine.  You develop chills, with or without a fever.  You pass out. MAKE SURE YOU:   Understand these instructions.  Will watch your condition.  Will get help right away if you are not doing well or get worse. Document Released: 12/21/2006 Document Revised: 07/10/2013 Document Reviewed: 01/10/2009 [facility] Patient Information 2015 [place], [place]. This information is not intended to replace advice given to you by your health care provider. Make sure you discuss any questions you have with your health care provider.  Constipation Constipation is when a person has fewer than three bowel movements a week, has difficulty having a bowel movement, or has stools that are dry, hard, or larger than normal. As people grow older, constipation is more common. If you try to fix constipation with medicines that make you have a bowel movement (laxatives), the problem may get worse. Long-term laxative use may cause the muscles of the colon to become weak. A low-fiber diet, not taking in enough fluids, and taking certain medicines may make constipation worse.  CAUSES   Certain medicines, such as antidepressants, pain medicine, iron supplements, antacids, and water pills.   Certain diseases, such as diabetes, irritable bowel syndrome (IBS), thyroid disease, or depression.   Not drinking enough water.   Not eating enough fiber-rich foods.   Stress or travel.   Lack of physical activity or  exercise.   Ignoring the urge to have a bowel movement.   Using laxatives too much.  SIGNS AND SYMPTOMS   Having fewer than three bowel movements a week.   Straining to have a bowel movement.   Having stools that are hard, dry, or larger than normal.   Feeling full or bloated.   Pain in the lower abdomen.   Not feeling relief after having a bowel movement.  DIAGNOSIS  Your health care provider will take a medical history and perform a physical exam. Further testing may be done for severe constipation. Some tests may include:  A barium enema X-ray to examine your rectum, colon, and, sometimes, your small intestine.   A sigmoidoscopy to examine your lower colon.   A colonoscopy to examine your entire colon. TREATMENT  Treatment will depend on the severity of your constipation and what is causing it. Some dietary treatments include drinking more fluids and eating more fiber-rich foods. Lifestyle treatments may include regular exercise. If these diet and lifestyle recommendations do not help, your health care provider may recommend taking over-the-counter laxative medicines to help you have bowel movements. Prescription medicines may be prescribed if over-the-counter medicines do not work.  HOME CARE INSTRUCTIONS   Eat foods that have a lot of fiber, such as fruits, vegetables, whole grains, and beans.  Limit foods high in fat and processed sugars, such as french fries, hamburgers,  cookies, candies, and soda.   A fiber supplement may be added to your diet if you cannot get enough fiber from foods.   Drink enough fluids to keep your urine clear or pale yellow.   Exercise regularly or as directed by your health care provider.   Go to the restroom when you have the urge to go. Do not hold it.   Only take over-the-counter or prescription medicines as directed by your health care provider. Do not take other medicines for constipation without talking to your health  care provider first.  [place] IF:   You have bright red blood in your stool.   Your constipation lasts for more than 4 days or gets worse.   You have abdominal or rectal pain.   You have thin, pencil-like stools.   You have unexplained weight loss. MAKE SURE YOU:   Understand these instructions.  Will watch your condition.  Will get help right away if you are not doing well or get worse. Document Released: 11/22/2003 Document Revised: 02/28/2013 Document Reviewed: 12/05/2012 [facility] Patient Information 2015 [place], [place]. This information is not intended to replace advice given to you by your health care provider. Make sure you discuss any questions you have with your health care provider.

## 2013-10-24 NOTE — ED Provider Notes (Signed)
CSN: [id number]     Arrival date & time 10/24/13  1749 History  This chart was scribed for Jenna Blade, MD by Delphia Grates, ED Scribe. This patient was seen in room APA03/APA03 and the patient's care was started at 6:25 PM.    Chief Complaint  Patient presents with  . Abdominal Pain      The history is provided by the patient. No language interpreter was used.    HPI Comments: [name] is a 68 y.o. female who presents to the Emergency Department complaining of nonradiating, right, lower abdominal pain onset yesterday. Patient rates her pain as 9/10 and describes it as stabbing. Patient denies history of cholecystectomy or appendectomy. Patient has not taken anything for her pain relief. She denies nausea, emesis, diarrhea, cough, CP, weakness, or dizziness. Patient has history of scoliosis, arthritis, skin cancer, hypothyroidism, asthma, fibromyalgia, depression, and GERD.  Past Medical History  Diagnosis Date  . Scoliosis   . Migraines   . Arthritis   . Hypothyroidism   . Asthma   . Fibromyalgia   . Depression   . OCD (obsessive compulsive disorder)   . Arthritis   . Spinal pain   . Cancer   . Complication of anesthesia   . Osteoporosis   . Psoriasis   . History of skin cancer   . Constipation   . GERD (gastroesophageal reflux disease)   . Anxiety   . Bipolar 2 disorder    Past Surgical History  Procedure Laterality Date  . Dilation and curettage of uterus  x3  . Tubal ligation    . Arthroscopic left knee surgery    . Wrist surgery    . Mandible surgery    . Tonsillectomy    . Total hip arthroplasty Right 05/09/2013    Procedure: RIGHT TOTAL HIP ARTHROPLASTY ANTERIOR APPROACH;  Surgeon: Mauri Pole, MD;  Location: [facility];  Service: Orthopedics;  Laterality: Right;   Family History  Problem Relation Age of Onset  . Heart disease Mother   . Diabetes Mother   . Colon cancer Father   . Heart disease Brother    History  Substance Use Topics   . Smoking status: Former Smoker -- 2.00 packs/day for 19 years    Types: Cigarettes    Quit date: 03/10/1979  . Smokeless tobacco: Never Used  . Alcohol Use: No     Comment: no   OB History   Grav Para Term Preterm Abortions TAB SAB Ect Mult Living                 Review of Systems  Gastrointestinal: Positive for abdominal pain. Negative for nausea, vomiting and diarrhea.  Neurological: Negative for dizziness and weakness.  All other systems reviewed and are negative.     Allergies  Prednisone; Adhesive; Betadine; Demerol; Latex; Levaquin; Ampicillin; Cephalexin; Doxycycline; Erythromycin; Keflex; Penicillins; and Tetracyclines & related  Home Medications   Prior to Admission medications   Medication Sig Start Date End Date Taking? Authorizing Provider  albuterol (PROVENTIL HFA;VENTOLIN HFA) 108 (90 BASE) MCG/ACT inhaler Inhale 1-2 puffs into the lungs every 6 (six) hours as needed for wheezing. 04/12/12  Yes Julianne Rice, MD  cyclobenzaprine (FLEXERIL) 10 MG tablet Take 1 tablet (10 mg total) by mouth 2 (two) times daily as needed for muscle spasms. 07/11/13  Yes Kathyrn Drown, MD  diclofenac (VOLTAREN) 75 MG EC tablet Take 1 tablet (75 mg total) by mouth 2 (two) times daily. 08/02/13  Yes Scott  A Luking, MD  escitalopram (LEXAPRO) 10 MG tablet Take 10 mg by mouth at bedtime. 10/13/13  Yes Kathyrn Drown, MD  fentaNYL (DURAGESIC) 50 MCG/HR Place 1 patch (50 mcg total) onto the skin every 3 (three) days. 10/13/13 10/13/14 Yes Kathyrn Drown, MD  ferrous sulfate 325 (65 FE) MG tablet Take 325 mg by mouth daily.   Yes Historical Provider, MD  fluticasone (FLONASE) 50 MCG/ACT nasal spray Place 2 sprays into both nostrils daily. 05/05/13  Yes Brand Males, MD  HYDROcodone-acetaminophen (NORCO) 10-325 MG per tablet Take 1 tablet by mouth every 6 (six) hours as needed for moderate pain or severe pain.   Yes Historical Provider, MD  lamoTRIgine (LAMICTAL) 100 MG tablet Take 100 mg by mouth  at bedtime. 10/13/13  Yes Kathyrn Drown, MD  levothyroxine (SYNTHROID, LEVOTHROID) 100 MCG tablet Take 1 tablet (100 mcg total) by mouth daily before breakfast. 07/11/13  Yes Kathyrn Drown, MD  LORazepam (ATIVAN) 1 MG tablet Take 1 mg by mouth at bedtime.   Yes Historical Provider, MD  ondansetron (ZOFRAN) 8 MG tablet Take by mouth every 8 (eight) hours as needed for nausea.   Yes Historical Provider, MD  oxyCODONE (OXY IR/ROXICODONE) 5 MG immediate release tablet Take 1-2 tablets (5-10 mg total) by mouth every 4 (four) hours as needed for breakthrough pain. 05/10/13  Yes Lucille Passy Babish, PA-C  rizatriptan (MAXALT) 10 MG tablet Take 1 tablet (10 mg total) by mouth once as needed for migraine. May repeat in 2 hours if needed 07/11/13 07/11/14 Yes Kathyrn Drown, MD   Triage Vitals: BP 103/69  Pulse 76  Temp(Src) 99 F (37.2 C) (Oral)  Resp 20  Ht 5' (1.524 m)  Wt 129 lb (58.514 kg)  BMI 25.19 kg/m2  SpO2 96%  Physical Exam  Nursing note and vitals reviewed. Constitutional: She is oriented to person, place, and time. She appears well-developed and well-nourished.  HENT:  Head: Normocephalic and atraumatic.  Mouth/Throat: Oropharynx is clear and moist.  Eyes: Conjunctivae and EOM are normal. Pupils are equal, round, and reactive to light.  Neck: Normal range of motion and phonation normal. Neck supple.  Cardiovascular: Normal rate, regular rhythm and intact distal pulses.   Pulmonary/Chest: Effort normal and breath sounds normal. She exhibits no tenderness.  Abdominal: Soft. Bowel sounds are normal. She exhibits no distension. There is tenderness. There is no guarding.  Mild RUQ and right mid abdominal tenderness.  Musculoskeletal: Normal range of motion.  Neurological: She is alert and oriented to person, place, and time. She exhibits normal muscle tone.  Skin: Skin is warm and dry.  Psychiatric: She has a normal mood and affect. Her behavior is normal. Judgment and thought content normal.     ED Course  Procedures (including critical care time)  Medications  0.9 %  sodium chloride infusion ( Intravenous Stopped 10/25/13 0024)  sodium chloride 0.9 % bolus 500 mL (0 mLs Intravenous Stopped 10/24/13 1926)  morphine 4 MG/ML injection 4 mg (4 mg Intravenous Given 10/24/13 1845)  ondansetron (ZOFRAN) injection 4 mg (4 mg Intravenous Given 10/24/13 1846)  iohexol (OMNIPAQUE) 300 MG/ML solution 50 mL (50 mLs Oral Contrast Given 10/24/13 1941)  iohexol (OMNIPAQUE) 300 MG/ML solution 100 mL (100 mLs Intravenous Contrast Given 10/24/13 2013)  HYDROmorphone (DILAUDID) injection 1 mg (1 mg Intravenous Given 10/24/13 2036)  ondansetron (ZOFRAN) injection 4 mg (4 mg Intravenous Given 10/24/13 2036)    Patient Vitals for the past 24 hrs:  BP  Temp Temp src Pulse Resp SpO2 Height Weight  10/24/13 2358 120/70 mmHg - - 75 20 98 % - -  10/24/13 2129 122/66 mmHg - - 75 19 98 % - -  10/24/13 1815 103/69 mmHg 99 F (37.2 C) Oral 76 20 96 % 5' (1.524 m) 129 lb (58.514 kg)    6:29 PM-Discussed treatment plan which includes CBC, CMP, and UA with pt at bedside and pt agreed to plan.   At discharge- Reevaluation with update and discussion. After initial assessment and treatment, an updated evaluation reveals the patient was more comfortable. She states that she has pain medicines, and home. The findings were discussed with the patient, and all questions were answered. [place] Review Labs Reviewed  CBC WITH DIFFERENTIAL - Abnormal; Notable for the following:    RDW 16.4 (*)    Neutrophils Relative % 41 (*)    Lymphocytes Relative 49 (*)    All other components within normal limits  COMPREHENSIVE METABOLIC PANEL - Abnormal; Notable for the following:    Glucose, Bld 112 (*)    GFR calc non Af Amer 83 (*)    All other components within normal limits  URINE CULTURE  URINALYSIS, ROUTINE W REFLEX MICROSCOPIC  LIPASE, BLOOD    Imaging Review Ct Abdomen Pelvis W Contrast  10/24/2013    CLINICAL DATA:  Right lower quadrant abdominal pain.  EXAM: CT ABDOMEN AND PELVIS WITH CONTRAST  TECHNIQUE: Multidetector CT imaging of the abdomen and pelvis was performed using the standard protocol following bolus administration of intravenous contrast.  CONTRAST:  79mL OMNIPAQUE IOHEXOL 300 MG/ML SOLN, 157mL OMNIPAQUE IOHEXOL 300 MG/ML SOLN  COMPARISON:  None.  FINDINGS: Lung bases:  Clear.  CT abdomen:  The solid abdominal organs are normal. No inflammatory changes or mass lesions. The gallbladder is normal. No common bile duct dilatation. Mild fatty change and atrophy involving the pancreas. No all inflammation or mass. The portal, hepatic and splenic veins are patent. The spleen is normal in size. No focal lesions. The adrenal glands and kidneys are unremarkable.  The stomach, duodenum, small bowel and colon are unremarkable. No inflammatory changes, mass lesions or obstructive findings. There is a moderate amount of stool throughout the colon which may suggest constipation. There is a low lying cecum deep in the pelvis. The appendix is normal.  No mesenteric or retroperitoneal mass or adenopathy. There is moderate tortuosity of the abdominal aorta largely due to a severe scoliosis but the major branch vessels are patent. Minimal atherosclerotic changes.  CT pelvis:  The uterus and ovaries are unremarkable. The bladder is normal. No pelvic mass, adenopathy or free pelvic fluid collections. No inguinal mass or adenopathy.  Bony structures:  Severe scoliosis with associated advanced degenerative lumbar spondylosis. No acute bony findings. Moderate artifact related to a right hip prosthesis.  IMPRESSION: No acute abdominal/ pelvic findings, mass lesions or adenopathy.   Electronically Signed   By: Kalman Jewels M.D.   On: 10/24/2013 20:45     EKG Interpretation None      MDM   Final diagnoses:  Abdominal pain, unspecified abdominal location  Constipation, unspecified constipation type     Nonspecific abdominal pain, with reassuring findings on evaluation. Her pain may be related to constipation. She has not had a bowel movement in 2 days. Is no evidence for serious bacterial infection or metabolic instability. Her appendix and gallbladder were normal on the CT scan.    Nursing Notes Reviewed/ Care Coordinated Applicable  Imaging Reviewed Interpretation of Laboratory Data incorporated into ED treatment  The patient appears reasonably screened and/or stabilized for discharge and I doubt any other medical condition or other [facility] requiring further screening, evaluation, or treatment in the ED at this time prior to discharge.  Plan: Home Medications- Stool softener of choice; Home Treatments- rest; return here if the recommended treatment, does not improve the symptoms; Recommended follow up- PCP prn  I personally performed the services described in this documentation, which was scribed in my presence. The recorded information has been reviewed and is accurate.      Jenna Blade, MD 10/25/13 [phone]

## 2013-10-24 NOTE — ED Notes (Signed)
Pt c/o RLQ abdominal pain since yesterday. States moving makes worse. Describes pain as stabbing in nature.

## 2013-10-25 ENCOUNTER — Telehealth: Payer: Self-pay | Admitting: *Deleted

## 2013-10-25 NOTE — Telephone Encounter (Signed)
ER last night.  Last BM was Thursday. It was a normal BM. She usually has a BM every 3-4 days.   Her last colonoscopy was 3 years ago, and it was normal.  Patient is taking Milk of Magnesia.

## 2013-10-25 NOTE — Telephone Encounter (Signed)
Patient notified and verbalized understanding. She still hasn't had a bm. I told her if her pain persists even after the bm then she ought to schedule a f/u OV.

## 2013-10-25 NOTE — Telephone Encounter (Signed)
May use fleets enema when necessary if having difficult time with a hard stool. I would also recommend MiraLax one half full in 8 ounces water twice daily also may use Benefiber 2 teaspoons twice daily in water. Should help. If necessary take Dulcolax tablets. If ongoing trouble followup. The goal is to have a soft bowel movement every few days

## 2013-10-26 LAB — URINE CULTURE
CULTURE: NO GROWTH
Colony Count: NO GROWTH

## 2013-11-08 DIAGNOSIS — Z96649 Presence of unspecified artificial hip joint: Secondary | ICD-10-CM | POA: Diagnosis not present

## 2013-11-08 DIAGNOSIS — M1990 Unspecified osteoarthritis, unspecified site: Secondary | ICD-10-CM | POA: Diagnosis not present

## 2013-11-14 DIAGNOSIS — M76899 Other specified enthesopathies of unspecified lower limb, excluding foot: Secondary | ICD-10-CM | POA: Diagnosis not present

## 2013-11-16 DIAGNOSIS — M76899 Other specified enthesopathies of unspecified lower limb, excluding foot: Secondary | ICD-10-CM | POA: Diagnosis not present

## 2013-11-21 DIAGNOSIS — M76899 Other specified enthesopathies of unspecified lower limb, excluding foot: Secondary | ICD-10-CM | POA: Diagnosis not present

## 2013-11-23 DIAGNOSIS — M76899 Other specified enthesopathies of unspecified lower limb, excluding foot: Secondary | ICD-10-CM | POA: Diagnosis not present

## 2013-11-26 ENCOUNTER — Other Ambulatory Visit: Payer: Self-pay | Admitting: Family Medicine

## 2013-11-26 ENCOUNTER — Encounter (HOSPITAL_COMMUNITY): Payer: Self-pay | Admitting: Emergency Medicine

## 2013-11-26 ENCOUNTER — Emergency Department (HOSPITAL_COMMUNITY)
Admission: EM | Admit: 2013-11-26 | Discharge: 2013-11-26 | Disposition: A | Discharge status: Home / Self Care | Payer: Medicare Other | Attending: Emergency Medicine | Admitting: Emergency Medicine

## 2013-11-26 ENCOUNTER — Emergency Department (HOSPITAL_COMMUNITY): Payer: Medicare Other

## 2013-11-26 DIAGNOSIS — M549 Dorsalgia, unspecified: Secondary | ICD-10-CM | POA: Diagnosis not present

## 2013-11-26 DIAGNOSIS — G43909 Migraine, unspecified, not intractable, without status migrainosus: Secondary | ICD-10-CM | POA: Insufficient documentation

## 2013-11-26 DIAGNOSIS — Z79899 Other long term (current) drug therapy: Secondary | ICD-10-CM | POA: Diagnosis not present

## 2013-11-26 DIAGNOSIS — Z8719 Personal history of other diseases of the digestive system: Secondary | ICD-10-CM | POA: Diagnosis not present

## 2013-11-26 DIAGNOSIS — Z872 Personal history of diseases of the skin and subcutaneous tissue: Secondary | ICD-10-CM | POA: Insufficient documentation

## 2013-11-26 DIAGNOSIS — R1031 Right lower quadrant pain: Secondary | ICD-10-CM | POA: Insufficient documentation

## 2013-11-26 DIAGNOSIS — Z880 Allergy status to penicillin: Secondary | ICD-10-CM | POA: Diagnosis not present

## 2013-11-26 DIAGNOSIS — R109 Unspecified abdominal pain: Secondary | ICD-10-CM | POA: Diagnosis not present

## 2013-11-26 DIAGNOSIS — Z791 Long term (current) use of non-steroidal anti-inflammatories (NSAID): Secondary | ICD-10-CM | POA: Insufficient documentation

## 2013-11-26 DIAGNOSIS — F329 Major depressive disorder, single episode, unspecified: Secondary | ICD-10-CM | POA: Insufficient documentation

## 2013-11-26 DIAGNOSIS — R52 Pain, unspecified: Secondary | ICD-10-CM | POA: Diagnosis not present

## 2013-11-26 DIAGNOSIS — F3189 Other bipolar disorder: Secondary | ICD-10-CM | POA: Insufficient documentation

## 2013-11-26 DIAGNOSIS — K7689 Other specified diseases of liver: Secondary | ICD-10-CM | POA: Diagnosis not present

## 2013-11-26 DIAGNOSIS — F3289 Other specified depressive episodes: Secondary | ICD-10-CM | POA: Insufficient documentation

## 2013-11-26 DIAGNOSIS — M129 Arthropathy, unspecified: Secondary | ICD-10-CM | POA: Insufficient documentation

## 2013-11-26 DIAGNOSIS — E039 Hypothyroidism, unspecified: Secondary | ICD-10-CM | POA: Diagnosis not present

## 2013-11-26 DIAGNOSIS — J45909 Unspecified asthma, uncomplicated: Secondary | ICD-10-CM | POA: Diagnosis not present

## 2013-11-26 DIAGNOSIS — R6889 Other general symptoms and signs: Secondary | ICD-10-CM | POA: Diagnosis not present

## 2013-11-26 LAB — COMPREHENSIVE METABOLIC PANEL
ALK PHOS: 103 U/L (ref 39–117)
ALT: 21 U/L (ref 0–35)
ANION GAP: 10 (ref 5–15)
AST: 18 U/L (ref 0–37)
Albumin: 4.1 g/dL (ref 3.5–5.2)
BILIRUBIN TOTAL: 0.6 mg/dL (ref 0.3–1.2)
BUN: 17 mg/dL (ref 6–23)
CO2: 29 meq/L (ref 19–32)
Calcium: 9.3 mg/dL (ref 8.4–10.5)
Chloride: 99 mEq/L (ref 96–112)
Creatinine, Ser: 0.81 mg/dL (ref 0.50–1.10)
GFR calc Af Amer: 84 mL/min — ABNORMAL LOW (ref >90)
GFR calc non Af Amer: 72 mL/min — ABNORMAL LOW (ref >90)
GLUCOSE: 84 mg/dL (ref 70–99)
POTASSIUM: 3.8 meq/L (ref 3.7–5.3)
SODIUM: 138 meq/L (ref 137–147)
Total Protein: 7.7 g/dL (ref 6.0–8.3)

## 2013-11-26 LAB — URINALYSIS, ROUTINE W REFLEX MICROSCOPIC
BILIRUBIN URINE: NEGATIVE
GLUCOSE, UA: NEGATIVE mg/dL
HGB URINE DIPSTICK: NEGATIVE
Ketones, ur: NEGATIVE mg/dL
Nitrite: NEGATIVE
PROTEIN: NEGATIVE mg/dL
Specific Gravity, Urine: 1.015 (ref 1.005–1.030)
UROBILINOGEN UA: 1 mg/dL (ref 0.0–1.0)
pH: 6.5 (ref 5.0–8.0)

## 2013-11-26 LAB — CBC WITH DIFFERENTIAL/PLATELET
Basophils Absolute: 0.1 10*3/uL (ref 0.0–0.1)
Basophils Relative: 1 % (ref 0–1)
Eosinophils Absolute: 0.1 10*3/uL (ref 0.0–0.7)
Eosinophils Relative: 2 % (ref 0–5)
HCT: 44.2 % (ref 36.0–46.0)
HEMOGLOBIN: 15 g/dL (ref 12.0–15.0)
LYMPHS ABS: 2.8 10*3/uL (ref 0.7–4.0)
Lymphocytes Relative: 30 % (ref 12–46)
MCH: 29.9 pg (ref 26.0–34.0)
MCHC: 33.9 g/dL (ref 30.0–36.0)
MCV: 88 fL (ref 78.0–100.0)
MONOS PCT: 6 % (ref 3–12)
Monocytes Absolute: 0.5 10*3/uL (ref 0.1–1.0)
NEUTROS PCT: 61 % (ref 43–77)
Neutro Abs: 5.7 10*3/uL (ref 1.7–7.7)
PLATELETS: 257 10*3/uL (ref 150–400)
RBC: 5.02 MIL/uL (ref 3.87–5.11)
RDW: 14.2 % (ref 11.5–15.5)
WBC: 9.2 10*3/uL (ref 4.0–10.5)

## 2013-11-26 LAB — URINE MICROSCOPIC-ADD ON

## 2013-11-26 LAB — I-STAT CG4 LACTIC ACID, ED: Lactic Acid, Venous: 1.37 mmol/L (ref 0.5–2.2)

## 2013-11-26 MED ORDER — NAPROXEN 375 MG PO TABS: 375.0000 mg | ORAL_TABLET | Freq: Two times a day (BID) | ORAL | Status: DC

## 2013-11-26 MED ORDER — KETOROLAC TROMETHAMINE 30 MG/ML IJ SOLN
15.0000 mg | Freq: Once | INTRAMUSCULAR | Status: AC
  Administered 2013-11-26: 15 mg via INTRAVENOUS
  Filled 2013-11-26: qty 1

## 2013-11-26 MED ORDER — HYDROCODONE-ACETAMINOPHEN 5-325 MG PO TABS: 1.0000 | ORAL_TABLET | ORAL | Status: DC | PRN

## 2013-11-26 MED ORDER — SODIUM CHLORIDE 0.9 % IV BOLUS (SEPSIS)
500.0000 mL | Freq: Once | INTRAVENOUS | Status: AC
  Administered 2013-11-26: 500 mL via INTRAVENOUS

## 2013-11-26 MED ORDER — HYDROMORPHONE HCL 1 MG/ML IJ SOLN
1.0000 mg | Freq: Once | INTRAMUSCULAR | Status: AC
  Administered 2013-11-26: 1 mg via INTRAVENOUS
  Filled 2013-11-26: qty 1

## 2013-11-26 NOTE — Discharge Instructions (Signed)
If you were given medicines take as directed.  If you are on coumadin or contraceptives realize their levels and effectiveness is altered by many different medicines.  If you have any reaction (rash, tongues swelling, other) to the medicines stop taking and see a physician.   Please follow up as directed and return to the ER or see a physician for new or worsening symptoms.  Thank you. Filed Vitals:   11/26/13 1044 11/26/13 1314 11/26/13 1330 11/26/13 1400  BP: 127/84 119/66 128/64 116/63  Pulse: 70 59 61 67  Temp: 97.7 F (36.5 C)     TempSrc: Oral     Resp: 16 16    Height: 5\' 1"  (1.549 m)     Weight: 128 lb (58.06 kg)     SpO2: 100% 95% 87% 88%

## 2013-11-26 NOTE — ED Notes (Signed)
Pt was standing up, started to turn head, and felt sharp stabbing pain in lower right back. Pt reached for back and felt "softball like lump" protruding from back." Pt called EMS. Pt has hx of muscle spasms but states "this is different." pt has 10/10 pain. Pt states she was unable to ambulate after incident. Pt A&O.

## 2013-11-26 NOTE — ED Provider Notes (Signed)
CSN: [id number]     Arrival date & time 11/26/13  1041 History   This chart was scribed for Mariea Clonts, MD by Tula Nakayama, ED Scribe. This patient was seen in room APA07/APA07 and the patient's care was started at 11:30 AM.    Chief Complaint  Patient presents with  . Back Pain   The history is provided by the patient. No language interpreter was used.   HPI Comments: Jenna Contreras is a 68 y.o. female who presents to the Emergency Department complaining of constant, 10/10, severe, sharp back pain that radiates to right groin region and accompanying nausea that started this morning. Pt has no current nausea. Pt has not had pior similar symptoms. Pt denies any recent injury to the area. Pt states she has no vomiting, CP, weakness, numbness or SOB. Pt has hx of asthma, arthritis, herniated disk, hypothyroidism, scoliosis. Pt has no hx of kidney stones, ulcers, pelvic surgery, or abdominal surgery. Pt had a urine infection 5 months ago and a kidney infection that occurred during pregnancy 50 years ago. Pt given pain med in the ED with no relief to symptoms. Pt denies no appendectomy and cholecystectomy. Pt had no change in diet and ate tuna fish on crackers last evening.   Past Medical History  Diagnosis Date  . Scoliosis   . Migraines   . Arthritis   . Hypothyroidism   . Asthma   . Fibromyalgia   . Depression   . OCD (obsessive compulsive disorder)   . Arthritis   . Spinal pain   . Cancer   . Complication of anesthesia   . Osteoporosis   . Psoriasis   . History of skin cancer   . Constipation   . GERD (gastroesophageal reflux disease)   . Anxiety   . Bipolar 2 disorder    Past Surgical History  Procedure Laterality Date  . Dilation and curettage of uterus  x3  . Tubal ligation    . Arthroscopic left knee surgery    . Wrist surgery    . Mandible surgery    . Tonsillectomy    . Total hip arthroplasty Right 05/09/2013    Procedure: RIGHT TOTAL HIP ARTHROPLASTY  ANTERIOR APPROACH;  Surgeon: Mauri Pole, MD;  Location: [facility];  Service: Orthopedics;  Laterality: Right;   Family History  Problem Relation Age of Onset  . Heart disease Mother   . Diabetes Mother   . Colon cancer Father   . Heart disease Brother    History  Substance Use Topics  . Smoking status: Former Smoker -- 2.00 packs/day for 19 years    Types: Cigarettes    Quit date: 03/10/1979  . Smokeless tobacco: Never Used  . Alcohol Use: No     Comment: no   OB History   Grav Para Term Preterm Abortions TAB SAB Ect Mult Living                 Review of Systems  Respiratory: Negative for shortness of breath.   Cardiovascular: Negative for chest pain.  Gastrointestinal: Positive for nausea. Negative for vomiting.  Musculoskeletal: Positive for back pain.  Neurological: Negative for weakness and numbness.      Allergies  Prednisone; Adhesive; Betadine; Demerol; Latex; Levaquin; Ampicillin; Cephalexin; Doxycycline; Erythromycin; Keflex; Penicillins; and Tetracyclines & related  Home Medications   Prior to Admission medications   Medication Sig Start Date End Date Taking? Authorizing Provider  albuterol (PROVENTIL HFA;VENTOLIN HFA) 108 (90 BASE) MCG/ACT inhaler  Inhale 1-2 puffs into the lungs every 6 (six) hours as needed for wheezing. 04/12/12   Julianne Rice, MD  cyclobenzaprine (FLEXERIL) 10 MG tablet Take 1 tablet (10 mg total) by mouth 2 (two) times daily as needed for muscle spasms. 07/11/13   Kathyrn Drown, MD  diclofenac (VOLTAREN) 75 MG EC tablet Take 1 tablet (75 mg total) by mouth 2 (two) times daily. 08/02/13   Kathyrn Drown, MD  escitalopram (LEXAPRO) 10 MG tablet Take 10 mg by mouth at bedtime. 10/13/13   Kathyrn Drown, MD  fentaNYL (DURAGESIC) 50 MCG/HR Place 1 patch (50 mcg total) onto the skin every 3 (three) days. 10/13/13 10/13/14  Kathyrn Drown, MD  ferrous sulfate 325 (65 FE) MG tablet Take 325 mg by mouth daily.    Historical Provider, MD  fluticasone  (FLONASE) 50 MCG/ACT nasal spray Place 2 sprays into both nostrils daily. 05/05/13   Brand Males, MD  HYDROcodone-acetaminophen (NORCO) 10-325 MG per tablet Take 1 tablet by mouth every 6 (six) hours as needed for moderate pain or severe pain.    Historical Provider, MD  lamoTRIgine (LAMICTAL) 100 MG tablet Take 100 mg by mouth at bedtime. 10/13/13   Kathyrn Drown, MD  levothyroxine (SYNTHROID, LEVOTHROID) 100 MCG tablet Take 1 tablet (100 mcg total) by mouth daily before breakfast. 07/11/13   Kathyrn Drown, MD  LORazepam (ATIVAN) 1 MG tablet Take 1 mg by mouth at bedtime.    Historical Provider, MD  ondansetron (ZOFRAN) 8 MG tablet Take by mouth every 8 (eight) hours as needed for nausea.    Historical Provider, MD  oxyCODONE (OXY IR/ROXICODONE) 5 MG immediate release tablet Take 1-2 tablets (5-10 mg total) by mouth every 4 (four) hours as needed for breakthrough pain. 05/10/13   Lucille Passy Babish, PA-C  rizatriptan (MAXALT) 10 MG tablet Take 1 tablet (10 mg total) by mouth once as needed for migraine. May repeat in 2 hours if needed 07/11/13 07/11/14  Kathyrn Drown, MD   BP 127/84  Pulse 70  Temp(Src) 97.7 F (36.5 C) (Oral)  Resp 16  Ht 5\' 1"  (1.549 m)  Wt 128 lb (58.06 kg)  BMI 24.20 kg/m2  SpO2 100% Physical Exam  Nursing note and vitals reviewed. Constitutional: She appears well-developed and well-nourished. No distress.  HENT:  Head: Normocephalic and atraumatic.  Eyes: Conjunctivae and EOM are normal.  Neck: Neck supple. No tracheal deviation present.  Cardiovascular: Normal rate, regular rhythm and normal heart sounds.   No murmur heard. Pulmonary/Chest: Effort normal and breath sounds normal. No respiratory distress.  Anterior lung fields clear  Abdominal: Soft. She exhibits no distension. There is tenderness (Right flank) in the right lower quadrant. There is no guarding.  Musculoskeletal: She exhibits tenderness. She exhibits no edema.  Right paraspinal upper lumbar  Skin:  Skin is warm and dry.  Psychiatric: She has a normal mood and affect. Her behavior is normal.     ED Course  Procedures (including critical care time) DIAGNOSTIC STUDIES: Oxygen Saturation is 100% on RA, normal by my interpretation.    COORDINATION OF CARE: 11:37 AM Will ordered CT scan w/o contrast of region. Pt agreed to plan.   Labs Review Labs Reviewed  URINALYSIS, ROUTINE W REFLEX MICROSCOPIC - Abnormal; Notable for the following:    Leukocytes, UA TRACE (*)    All other components within normal limits  URINE MICROSCOPIC-ADD ON - Abnormal; Notable for the following:    Casts HYALINE CASTS (*)  All other components within normal limits  COMPREHENSIVE METABOLIC PANEL - Abnormal; Notable for the following:    GFR calc non Af Amer 72 (*)    GFR calc Af Amer 84 (*)    All other components within normal limits  CBC WITH DIFFERENTIAL  I-STAT CG4 LACTIC ACID, ED    Imaging Review Ct Renal Stone Study  11/26/2013   CLINICAL DATA:  Right-sided flank pain  EXAM: CT ABDOMEN AND PELVIS WITHOUT CONTRAST  TECHNIQUE: Multidetector CT imaging of the abdomen and pelvis was performed following the standard protocol without IV contrast.  COMPARISON:  CT abdomen pelvis 10/24/2013  FINDINGS: Visualization of the lower thorax demonstrates no consolidative or nodular pulmonary opacities. Normal heart size.  Lack of intravenous contrast material limits evaluation of the solid organ parenchyma.  The liver is diffusely low in attenuation compatible with hepatic steatosis. Gallbladder is unremarkable. Spleen is unremarkable. Mild fatty atrophy of the pancreas. Normal bilateral adrenal glands. The kidneys are symmetric in size. No hydronephrosis. No perinephric fat stranding.  Normal caliber abdominal aorta. No retroperitoneal lymphadenopathy. Uterus and adnexal structures are unremarkable. Urinary bladder is unremarkable.  Stool is present throughout the colon. No abnormal bowel wall thickening or  evidence for bowel obstruction. No free fluid or free intraperitoneal air. The appendix is normal.  Patient status post right hip arthroplasty. Multilevel degenerative disc disease of the lower lumbar spine. No aggressive or acute appearing osseous lesions. Rightward curvature of the lumbar spine.  IMPRESSION: No evidence for acute intra-abdominal process.  Hepatic steatosis.   Electronically Signed   By: Lovey Newcomer M.D.   On: 11/26/2013 12:12     EKG Interpretation None      MDM   Final diagnoses:  Acute right flank pain   I personally performed the services described in this documentation, which was scribed in my presence. The recorded information has been reviewed and is accurate.  Discussed differential including kidney stone, atypical appendix, muscle/skeletal. CT scan results reviewed no acute findings. Pain controlled in the ER. Blood work unremarkable. Patient couples close outpatient followup  Results and differential diagnosis were discussed with the patient/parent/guardian. Close follow up outpatient was discussed, comfortable with the plan.   Medications  HYDROmorphone (DILAUDID) injection 1 mg (1 mg Intravenous Given 11/26/13 1134)  HYDROmorphone (DILAUDID) injection 1 mg (1 mg Intravenous Given 11/26/13 1225)  sodium chloride 0.9 % bolus 500 mL (500 mLs Intravenous New Bag/Given 11/26/13 1225)  ketorolac (TORADOL) 30 MG/ML injection 15 mg (15 mg Intravenous Given 11/26/13 1238)    Filed Vitals:   11/26/13 1044 11/26/13 1314 11/26/13 1330 11/26/13 1400  BP: 127/84 119/66 128/64 116/63  Pulse: 70 59 61 67  Temp: 97.7 F (36.5 C)     TempSrc: Oral     Resp: 16 16    Height: 5\' 1"  (1.549 m)     Weight: 128 lb (58.06 kg)     SpO2: 100% 95% 87% 88%    Final diagnoses:  Acute right flank pain      Mariea Clonts, MD 11/26/13 1436

## 2013-12-04 DIAGNOSIS — M76899 Other specified enthesopathies of unspecified lower limb, excluding foot: Secondary | ICD-10-CM | POA: Diagnosis not present

## 2013-12-07 DIAGNOSIS — M7071 Other bursitis of hip, right hip: Secondary | ICD-10-CM | POA: Diagnosis not present

## 2013-12-11 DIAGNOSIS — M7071 Other bursitis of hip, right hip: Secondary | ICD-10-CM | POA: Diagnosis not present

## 2013-12-20 DIAGNOSIS — M7071 Other bursitis of hip, right hip: Secondary | ICD-10-CM | POA: Diagnosis not present

## 2013-12-28 DIAGNOSIS — M7071 Other bursitis of hip, right hip: Secondary | ICD-10-CM | POA: Diagnosis not present

## 2014-01-03 DIAGNOSIS — M1711 Unilateral primary osteoarthritis, right knee: Secondary | ICD-10-CM | POA: Diagnosis not present

## 2014-01-03 DIAGNOSIS — M1712 Unilateral primary osteoarthritis, left knee: Secondary | ICD-10-CM | POA: Diagnosis not present

## 2014-01-03 DIAGNOSIS — Z471 Aftercare following joint replacement surgery: Secondary | ICD-10-CM | POA: Diagnosis not present

## 2014-01-03 DIAGNOSIS — Z96641 Presence of right artificial hip joint: Secondary | ICD-10-CM | POA: Diagnosis not present

## 2014-01-18 ENCOUNTER — Ambulatory Visit (INDEPENDENT_AMBULATORY_CARE_PROVIDER_SITE_OTHER): Payer: Medicare Other | Admitting: Family Medicine

## 2014-01-18 ENCOUNTER — Encounter: Payer: Self-pay | Admitting: Family Medicine

## 2014-01-18 VITALS — BP 112/80 | Ht 61.5 in | Wt 136.4 lb

## 2014-01-18 DIAGNOSIS — Z23 Encounter for immunization: Secondary | ICD-10-CM

## 2014-01-18 DIAGNOSIS — Z13820 Encounter for screening for osteoporosis: Secondary | ICD-10-CM

## 2014-01-18 DIAGNOSIS — R911 Solitary pulmonary nodule: Secondary | ICD-10-CM | POA: Diagnosis not present

## 2014-01-18 DIAGNOSIS — E038 Other specified hypothyroidism: Secondary | ICD-10-CM | POA: Diagnosis not present

## 2014-01-18 DIAGNOSIS — E785 Hyperlipidemia, unspecified: Secondary | ICD-10-CM | POA: Diagnosis not present

## 2014-01-18 DIAGNOSIS — R5383 Other fatigue: Secondary | ICD-10-CM

## 2014-01-18 DIAGNOSIS — M8580 Other specified disorders of bone density and structure, unspecified site: Secondary | ICD-10-CM | POA: Diagnosis not present

## 2014-01-18 DIAGNOSIS — Z79899 Other long term (current) drug therapy: Secondary | ICD-10-CM | POA: Diagnosis not present

## 2014-01-18 MED ORDER — LEVOTHYROXINE SODIUM 100 MCG PO TABS: ORAL_TABLET | ORAL | Status: DC

## 2014-01-18 MED ORDER — FENTANYL 50 MCG/HR TD PT72
50.0000 ug | MEDICATED_PATCH | TRANSDERMAL | Status: DC
Start: 2014-01-18 — End: 2014-06-05

## 2014-01-18 MED ORDER — FENTANYL 50 MCG/HR TD PT72: 50.0000 ug | MEDICATED_PATCH | TRANSDERMAL | Status: DC

## 2014-01-18 MED ORDER — CYCLOBENZAPRINE HCL 10 MG PO TABS: 10.0000 mg | ORAL_TABLET | Freq: Two times a day (BID) | ORAL | Status: DC | PRN

## 2014-01-18 MED ORDER — DICLOFENAC SODIUM 75 MG PO TBEC: 75.0000 mg | DELAYED_RELEASE_TABLET | Freq: Two times a day (BID) | ORAL | Status: DC

## 2014-01-18 MED ORDER — LAMOTRIGINE 100 MG PO TABS: 100.0000 mg | ORAL_TABLET | Freq: Every day | ORAL | Status: DC

## 2014-01-18 NOTE — Progress Notes (Signed)
   Subjective:    Patient ID: Jenna Contreras, female    DOB: 1945/09/18, 68 y.o.   MRN: [id number]  HPI Patient is here today for her hypothyroidism follow up visit. Patient is doing well. Patient has an upcoming surgery for total knee replacement. Patient needs a handicap placard form completed also. Patient was seen at the [facility] ER 3 weeks ago for severe back pain. Patient states that she is concerned about the pulmonary nodules on her lungs. She is not happy with what her specialist has to say.    Review of Systems She denies chest tightness pressure pain shortness breath nausea vomiting diarrhea    Objective:   Physical Exam Neck no masses Lungs are clear no crackles respiratory rate normal Heart regular no murmurs Pulse normal blood pressure good Extremities no edema       Assessment & Plan:  hypothyroidism-ring-year-old medication check lab work History of iron deficient anemia check lab work await results Surgical clearance-patient is able to do the equivalent of housework walking going up a slope without shortness of breath she does not need cardiac testing she is approved for surgery. The patient was told she would have to stay away from all anti-inflammatories and aspirin at least 7 days before surgery. She was also instructed to follow the directions of the orthopedic specialist to avoid blood clots.  Chronic pain prescriptions were given she was told she must follow-up before additional. She states she is not using hydrocodone currently there is a possibility that she will need to take either hydrocodone or Percocet for breakthrough pain while she is in the postoperative phase. Otherwise she ought to stick with fentanyl patches  Pulmonary nodules these were reviewed they were stable I do not feel she needs further scans and I explained the rationale.  25 minutes spent with patient reviewing overall of this information and gathering it plus also treating her

## 2014-01-20 LAB — CBC WITH DIFFERENTIAL/PLATELET
BASOS ABS: 0.1 10*3/uL (ref 0.0–0.1)
BASOS PCT: 1 % (ref 0–1)
EOS ABS: 0.3 10*3/uL (ref 0.0–0.7)
Eosinophils Relative: 4 % (ref 0–5)
HEMATOCRIT: 40.7 % (ref 36.0–46.0)
HEMOGLOBIN: 13.9 g/dL (ref 12.0–15.0)
Lymphocytes Relative: 41 % (ref 12–46)
Lymphs Abs: 3.3 10*3/uL (ref 0.7–4.0)
MCH: 29 pg (ref 26.0–34.0)
MCHC: 34.2 g/dL (ref 30.0–36.0)
MCV: 84.8 fL (ref 78.0–100.0)
MONO ABS: 0.6 10*3/uL (ref 0.1–1.0)
Monocytes Relative: 8 % (ref 3–12)
NEUTROS ABS: 3.7 10*3/uL (ref 1.7–7.7)
Neutrophils Relative %: 46 % (ref 43–77)
Platelets: 290 10*3/uL (ref 150–400)
RBC: 4.8 MIL/uL (ref 3.87–5.11)
RDW: 14.5 % (ref 11.5–15.5)
WBC: 8.1 10*3/uL (ref 4.0–10.5)

## 2014-01-20 LAB — TSH: TSH: 4.878 u[IU]/mL — ABNORMAL HIGH (ref 0.350–4.500)

## 2014-01-20 LAB — LIPID PANEL
Cholesterol: 206 mg/dL — ABNORMAL HIGH (ref 0–200)
HDL: 69 mg/dL (ref >39)
LDL CALC: 123 mg/dL — AB (ref 0–99)
Total CHOL/HDL Ratio: 3 Ratio
Triglycerides: 69 mg/dL (ref <150)
VLDL: 14 mg/dL (ref 0–40)

## 2014-01-20 LAB — VITAMIN D 25 HYDROXY (VIT D DEFICIENCY, FRACTURES): Vit D, 25-Hydroxy: 32 ng/mL (ref 30–89)

## 2014-01-20 LAB — BASIC METABOLIC PANEL
BUN: 10 mg/dL (ref 6–23)
CHLORIDE: 103 meq/L (ref 96–112)
CO2: 27 mEq/L (ref 19–32)
CREATININE: 0.76 mg/dL (ref 0.50–1.10)
Calcium: 9.3 mg/dL (ref 8.4–10.5)
Glucose, Bld: 83 mg/dL (ref 70–99)
POTASSIUM: 4.2 meq/L (ref 3.5–5.3)
SODIUM: 140 meq/L (ref 135–145)

## 2014-01-22 ENCOUNTER — Other Ambulatory Visit: Payer: Self-pay

## 2014-01-22 MED ORDER — LEVOTHYROXINE SODIUM 112 MCG PO TABS: 112.0000 ug | ORAL_TABLET | Freq: Every day | ORAL | Status: DC

## 2014-01-23 ENCOUNTER — Ambulatory Visit (HOSPITAL_COMMUNITY): Payer: Medicare Other

## 2014-01-26 ENCOUNTER — Other Ambulatory Visit (HOSPITAL_COMMUNITY): Payer: Self-pay | Admitting: *Deleted

## 2014-01-26 NOTE — Patient Instructions (Addendum)
Jenna Contreras  01/26/2014                           YOUR PROCEDURE IS SCHEDULED ON:  02/12/14                ENTER FROM [address] - GO TO PARKING DECK               LOOK FOR [employer]ING  / [employer]                              FOLLOW  SIGNS TO SHORT STAY CENTER                 ARRIVE AT SHORT STAY AT:  8:00 AM               CALL THIS NUMBER IF ANY PROBLEMS THE DAY OF SURGERY :               [phone]                                REMEMBER:   Do not eat food or drink liquids AFTER MIDNIGHT                  Take these medicines the morning of surgery with               A SIPS OF WATER :   FLEXEDRIL / LEVOTHYROXINE / MAY TAKE HYDROCODONE IF NEEDED      Do not wear jewelry, make-up   Do not wear lotions, powders, or perfumes.   Do not shave legs or underarms 12 hrs. before surgery (men may shave face)  Do not bring valuables to the hospital.  Contacts, dentures or bridgework may not be worn into surgery.  Leave suitcase in the car. After surgery it may be brought to your room.  For patients admitted to the hospital more than one night, checkout time is            11:00 AM                                                        ________________________________________________________________________                                                                                                  [name]  Before surgery, you can play an important role.  Because skin is not sterile, your skin needs to be as free of germs as possible.  You can reduce the number of germs on your skin by washing with CHG (chlorahexidine gluconate) soap before surgery.  CHG is an antiseptic cleaner which kills germs and bonds with the skin to continue killing germs  even after washing. Please DO NOT use if you have an allergy to CHG or antibacterial soaps.  If your skin becomes reddened/irritated stop using the CHG and inform your nurse when you  arrive at Short Stay. Do not shave (including legs and underarms) for at least 48 hours prior to the first CHG shower.  You may shave your face. Please follow these instructions carefully:   1.  Shower with CHG Soap the night before surgery and the  morning of Surgery.   2.  If you choose to wash your hair, wash your hair first as usual with your  normal  Shampoo.   3.  After you shampoo, rinse your hair and body thoroughly to remove the  shampoo.                                         4.  Use CHG as you would any other liquid soap.  You can apply chg directly  to the skin and wash . Gently wash with scrungie or clean wascloth    5.  Apply the CHG Soap to your body ONLY FROM THE NECK DOWN.   Do not use on open                           Wound or open sores. Avoid contact with eyes, ears mouth and genitals (private parts).                        Genitals (private parts) with your normal soap.              6.  Wash thoroughly, paying special attention to the area where your surgery  will be performed.   7.  Thoroughly rinse your body with warm water from the neck down.   8.  DO NOT shower/wash with your normal soap after using and rinsing off  the CHG Soap .                9.  Pat yourself dry with a clean towel.             10.  Wear clean pajamas.             11.  Place clean sheets on your bed the night of your first shower and do not  sleep with pets.  Day of Surgery : Do not apply any lotions/deodorants the morning of surgery.  Please wear clean clothes to the hospital/surgery center.  FAILURE TO FOLLOW THESE INSTRUCTIONS MAY RESULT IN THE CANCELLATION OF YOUR SURGERY    PATIENT SIGNATURE_________________________________  ______________________________________________________________________     Jenna Contreras  An incentive spirometer is a tool that can help keep your lungs clear and active. This tool measures how well you are filling your lungs with each breath.  Taking long deep breaths may help reverse or decrease the chance of developing breathing (pulmonary) problems (especially infection) following:  A long period of time when you are unable to move or be active. BEFORE THE PROCEDURE   If the spirometer includes an indicator to show your best effort, your nurse or respiratory therapist will set it to a desired goal.  If possible, sit up straight or lean slightly forward. Try not to slouch.  Hold the incentive spirometer in an upright position. INSTRUCTIONS FOR USE  Sit on the edge of your bed if possible, or sit up as far as you can in bed or on a chair.  Hold the incentive spirometer in an upright position.  Breathe out normally.  Place the mouthpiece in your mouth and seal your lips tightly around it.  Breathe in slowly and as deeply as possible, raising the piston or the ball toward the top of the column.  Hold your breath for 3-5 seconds or for as long as possible. Allow the piston or ball to fall to the bottom of the column.  Remove the mouthpiece from your mouth and breathe out normally.  Rest for a few seconds and repeat Steps 1 through 7 at least 10 times every 1-2 hours when you are awake. Take your time and take a few normal breaths between deep breaths.  The spirometer may include an indicator to show your best effort. Use the indicator as a goal to work toward during each repetition.  After each set of 10 deep breaths, practice coughing to be sure your lungs are clear. If you have an incision (the cut made at the time of surgery), support your incision when coughing by placing a pillow or rolled up towels firmly against it. Once you are able to get out of bed, walk around indoors and cough well. You may stop using the incentive spirometer when instructed by your caregiver.  RISKS AND COMPLICATIONS  Take your time so you do not get dizzy or light-headed.  If you are in pain, you may need to take or ask for pain medication  before doing incentive spirometry. It is harder to take a deep breath if you are having pain. AFTER USE  Rest and breathe slowly and easily.  It can be helpful to keep track of a log of your progress. Your caregiver can provide you with a simple table to help with this. If you are using the spirometer at home, follow these instructions: [place] IF:   You are having difficultly using the spirometer.  You have trouble using the spirometer as often as instructed.  Your pain medication is not giving enough relief while using the spirometer.  You develop fever of 100.5 F (38.1 C) or higher. SEEK IMMEDIATE MEDICAL CARE IF:   You cough up bloody sputum that had not been present before.  You develop fever of 102 F (38.9 C) or greater.  You develop worsening pain at or near the incision site. MAKE SURE YOU:   Understand these instructions.  Will watch your condition.  Will get help right away if you are not doing well or get worse. Document Released: 07/06/2006 Document Revised: 05/18/2011 Document Reviewed: 09/06/2006 Exit[employer] Patient Information 2014 Exit[employer], [place].   ________________________________________________________________________  WHAT IS A BLOOD TRANSFUSION? Blood Transfusion Information  A transfusion is the replacement of blood or some of its parts. Blood is made up of multiple cells which provide different functions.  Red blood cells carry oxygen and are used for blood loss replacement.  White blood cells fight against infection.  Platelets control bleeding.  Plasma helps clot blood.  Other blood products are available for specialized needs, such as hemophilia or other clotting disorders. BEFORE THE TRANSFUSION  Who gives blood for transfusions?   Healthy volunteers who are fully evaluated to make sure their blood is safe. This is blood bank blood. Transfusion therapy is the safest it has ever been in the practice of medicine. Before blood is  taken from a donor,  a complete history is taken to make sure that person has no history of diseases nor engages in risky social behavior (examples are intravenous drug use or sexual activity with multiple partners). The donor's travel history is screened to minimize risk of transmitting infections, such as malaria. The donated blood is tested for signs of infectious diseases, such as HIV and hepatitis. The blood is then tested to be sure it is compatible with you in order to minimize the chance of a transfusion reaction. If you or a relative donates blood, this is often done in anticipation of surgery and is not appropriate for emergency situations. It takes many days to process the donated blood. RISKS AND COMPLICATIONS Although transfusion therapy is very safe and saves many lives, the main dangers of transfusion include:   Getting an infectious disease.  Developing a transfusion reaction. This is an allergic reaction to something in the blood you were given. Every precaution is taken to prevent this. The decision to have a blood transfusion has been considered carefully by your caregiver before blood is given. Blood is not given unless the benefits outweigh the risks. AFTER THE TRANSFUSION  Right after receiving a blood transfusion, you will usually feel much better and more energetic. This is especially true if your red blood cells have gotten low (anemic). The transfusion raises the level of the red blood cells which carry oxygen, and this usually causes an energy increase.  The nurse administering the transfusion will monitor you carefully for complications. HOME CARE INSTRUCTIONS  No special instructions are needed after a transfusion. You may find your energy is better. Speak with your caregiver about any limitations on activity for underlying diseases you may have. SEEK MEDICAL CARE IF:   Your condition is not improving after your transfusion.  You develop redness or irritation at the  intravenous (IV) site. SEEK IMMEDIATE MEDICAL CARE IF:  Any of the following symptoms occur over the next 12 hours:  Shaking chills.  You have a temperature by mouth above 102 F (38.9 C), not controlled by medicine.  Chest, back, or muscle pain.  People around you feel you are not acting correctly or are confused.  Shortness of breath or difficulty breathing.  Dizziness and fainting.  You get a rash or develop hives.  You have a decrease in urine output.  Your urine turns a dark color or changes to pink, red, or brown. Any of the following symptoms occur over the next 10 days:  You have a temperature by mouth above 102 F (38.9 C), not controlled by medicine.  Shortness of breath.  Weakness after normal activity.  The white part of the eye turns yellow (jaundice).  You have a decrease in the amount of urine or are urinating less often.  Your urine turns a dark color or changes to pink, red, or brown. Document Released: 02/21/2000 Document Revised: 05/18/2011 Document Reviewed: 10/10/2007 [facility] Patient Information 2014 [place], [place].  _______________________________________________________________________

## 2014-01-30 ENCOUNTER — Encounter (HOSPITAL_COMMUNITY): Payer: Self-pay

## 2014-01-30 ENCOUNTER — Encounter (HOSPITAL_COMMUNITY)
Admission: RE | Admit: 2014-01-30 | Discharge: 2014-01-30 | Disposition: A | Discharge status: Home / Self Care | Payer: Medicare Other | Source: Ambulatory Visit | Attending: Orthopedic Surgery | Admitting: Orthopedic Surgery

## 2014-01-30 DIAGNOSIS — Z01812 Encounter for preprocedural laboratory examination: Secondary | ICD-10-CM | POA: Diagnosis not present

## 2014-01-30 HISTORY — DX: Personal history of other diseases of the musculoskeletal system and connective tissue: Z87.39

## 2014-01-30 HISTORY — DX: Hypotension, unspecified: I95.9

## 2014-01-30 HISTORY — DX: Intervertebral disc disorders with myelopathy, thoracic region: M51.04

## 2014-01-30 LAB — URINALYSIS, ROUTINE W REFLEX MICROSCOPIC
BILIRUBIN URINE: NEGATIVE
GLUCOSE, UA: NEGATIVE mg/dL
Hgb urine dipstick: NEGATIVE
KETONES UR: NEGATIVE mg/dL
Leukocytes, UA: NEGATIVE
Nitrite: NEGATIVE
PH: 7 (ref 5.0–8.0)
Protein, ur: NEGATIVE mg/dL
Specific Gravity, Urine: 1.004 — ABNORMAL LOW (ref 1.005–1.030)
Urobilinogen, UA: 0.2 mg/dL (ref 0.0–1.0)

## 2014-01-30 LAB — SURGICAL PCR SCREEN
MRSA, PCR: NEGATIVE
Staphylococcus aureus: NEGATIVE

## 2014-01-30 LAB — PROTIME-INR
INR: 1 (ref 0.00–1.49)
Prothrombin Time: 13.3 seconds (ref 11.6–15.2)

## 2014-01-30 LAB — APTT: aPTT: 30 seconds (ref 24–37)

## 2014-01-30 NOTE — Progress Notes (Signed)
CBC / BMET done 01/18/14 in [place] - order cancelled for 01/30/14
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
## 2014-02-06 ENCOUNTER — Ambulatory Visit (INDEPENDENT_AMBULATORY_CARE_PROVIDER_SITE_OTHER): Payer: Medicare Other | Admitting: Internal Medicine

## 2014-02-06 ENCOUNTER — Encounter: Payer: Self-pay | Admitting: Internal Medicine

## 2014-02-06 VITALS — BP 122/72 | HR 80 | Ht 60.0 in | Wt 142.0 lb

## 2014-02-06 DIAGNOSIS — R911 Solitary pulmonary nodule: Secondary | ICD-10-CM | POA: Diagnosis not present

## 2014-02-06 DIAGNOSIS — J4520 Mild intermittent asthma, uncomplicated: Secondary | ICD-10-CM | POA: Diagnosis not present

## 2014-02-06 NOTE — Patient Instructions (Addendum)
ICD-9-CM ICD-10-CM   1. Lung nodule 793.11 R91.1 CT Chest Wo Contrast  2. Asthma, mild intermittent, uncomplicated 681.27 N17.00 CT Chest Wo Contrast    #Lung nodule and hx of smoking  - do CT chest wo contrast; will call with results  #Mild Intermittent asthma  - glad is stable  - use albuterol as needed  - respect need to avoid QVAR  - glad uptodate with flu shot  #Followup 6 months or sooner if needed

## 2014-02-06 NOTE — Progress Notes (Signed)
Subjective:    Patient ID: Jenna Contreras, female    DOB: 04/25/1945, 68 y.o.   MRN: [id number]  HPI  IOV Nov 604:  68 year old female accompanied with husband. States was in hospital ([facility]) with "pneumonia" around early September 2012. Spent 1 week in hospital. At discharge she was informed that the pneumonia had cleared and she no longer needed bronch but needed followup 5 weeks. States she was given a week of levoflox but now realizes that she should not have been given that due to risk of ligament injury. Around 11/25/10 visited [facility] (at this time unhappy with [facility] Rx) due to persistence of dry cough and diffuse bodyache (concerned side effect of levoflox). CXR was clear (personally reviewed). Had CBC, bmet, ck, esr - all normal. Ionized calcium was slightly normal.   Currently being bothered by dyspnea and chronic cough and also states that she forgot to mention she has birds. Reports dyspnea since admission in sept 2012. Feels likely getting worse. Rates it as moderate-severe. Even trying to talk or exertion like getting out of bed makes her dyspneic. Dyspnea improved by nebs. Cough also present since pneumonia. Was productive of brown sputum but now dry. Rates cough as moderate- severe. Coughs during sleep and when changing posture from supine to sitting. There is associated wheezing present.  Denies hemoptysis,  Both preventing her from cleaning birds room (see below)  PMHx 1. Asthma  - diagnosed mid 32s. Unclear how dx was made but she reckons it was based on PEF. Describes cough variant asthma. Hx of "respiratory arrest" in 1998 + but says she refused to go to ER and get Rx. Uses albuterol prn for asthma "attacks". Typicall gets attacks 1-4 times per week upon exposure to birds at home  2. Scoliosis  3. Admitted for depression and OCD in 2007  4. Irritable bowel syndrome.  2. Arthritis.  3. Scoliosis.  4. Migraines.  5. Hypothyroidism.  6.  Fibromyalgia.  Social 1. Birds - parrots with lot of feather dust in one room. Since 2000. Large Macaw and a cocakteel and a quaker. Total 3 birds. Gets exposed atleast 1-2 per week 2. Works as Dentist and domestic Geologist, engineering.  3. Husband and she work together in Scientist, research (physical sciences). In [place], [place]. Married x 27 years 4. Ex-smoker. Smoked cigs 15-20  Years x 1 ppd. Quit 35 years ago 5. Denies MJ, coccaine.  6. Severeal grandkids and 1 great grand kid    Nurse wil walk you for oxygen levels  Have full PFT breathing test - once test done, give [place] a call so I can review you aand advise over phone next step  Telephone call Nov 2012 Updated patient on CT results NOv 2012 -   #nomral lung parenchyma. Still with cough and dyspnea.  DDx of post pna cough taking time to resolve explained but she prefers workup now. She does not think that is the case  #small lung nodule < 72m  Plan as agreed with patient  - get records from Dr RMosetta Anis Esp spirometry, noptes, and allergy testing - [name] please organize this and leave in my look at  - if above are unhelpful, will get Methacholine chalenge and if that is negative will get CPST  - neeeds repeat CT chest wtihout contrast in 1  year     OV 08/05/2011  In terms of dyspnea and cough - lot better, almost gone. Has not had any asthma  attacks since sept 2012. Not on any maintenance mdi. Learning today not seen Dr Donnetta Simpers since 1996; so no records on details of the asthma diagnosis.Marland Kitchen However, she does report wheezing oonce daily with exposure to her birds. No associaed symptoms. No relieving factors   Past, Family, Social reviewed: reported TIA episodes 2 time since last visit. Says PMD refused to get CT brain and therefore wants me to address this isuseu. Most recent TIA 4 weeks ago was very upset with husband. Feels bp rose and next day could not remember peoples names and had word amnesia.  Still struggling with remembering. No focal weakness. Just memory issues.  #Memory issues  - will refer to neurology  #Wheezing  - have methacholine challenge test  #Followup  - will call with result  OV 09/03/2011 Here to discuss methacholine challenge results of 08/12/11:  - 25% reducction in fvc and 28% reductiion in fev1 at 69mg dose - and c/w asthma/ Her interimittent wheeze continues and is of moderate severity. We discussed her birds: clearly she is very emotionally involved about her birds because she has had them > 10 years. There  Is lot of bird dust exposure but she says they talk to her and are emotionally dependent on her.  Otherwise no new resp issues  Past, Family, Social reviewed: no change since last visit but intermittent "tia" symptoms of blurred vision continue. Neuro appt is only 10/03/11 per hx. She interested in speeding this up.  #ASthma  - methacholine challenge test consistent with asthma  - start qvar 879m 2 puff twice daily with spacer; nurse will give sample and teach technique and give script  - understand that cutting down bird exposure is difficult; we will closely monitor your lung health  #Followup  - 3 months or sooner for asthma followup  - our coordinator will see if neurologist can see you sooner than July 27th, 2013   OV 12/03/2011 Followup mild persistent asthma.   In interim: Took qvar for a while and then stopped because she was feeling well though she knew that qvar is maintenance. She says no adversed events since coming off qvar. Asymptomatic. No albuterol rescue use. No nocturnal awakening. No wheeze. No dyspnea. No cough. She has had her flu shot. She wants to monitor herself off qvar  Past, Family, Social reviewed: seen by Dr WoJacelyn Gripf neuro per her hx: no TIA. One bird died. Another bird has cancer and expected to die in weeks to months   REC Glad you are feeling better and asymptomatic  Glad you had flu shot  As discussed we agreed that we  will watch you off QVAR  Return in 9 months or sooner if needed  OV 09/01/2012  Since last visit overall well but on 05/02/12 went tot [place] ER for for atypical chest pain, Didimer and cxr negative. Sent home ast atypical chest pain. IN termof asthma" she feels wihtou QVAR she is more symptomatich with chest tightness and dyspnea. Still albuterol use is reare and nocturnal awakenings are none. No wheeizing. One bird still alive andthere is lot of feather dust. Husband says she wont wear mask. She prefers to go back on QVAR      Notes Recorded by MuBrand MalesMD on 08/18/2012 at 10:35 AM Lung nodule no change since nov 2012 -> June 2014. No further fu needed for nodule      Past, Family, Social reviewed: no change since last visit   OV 02/06/2014  Chief Complaint  Patient presents with  . Follow-up    Pt states she is having a total knee replacement on left knee on 02/12/14. Pt denies SOB, cough and CP/tightness. Pt has no complaints.     Folllowup   - Lung nodule   - Give hx  Of smoking age [age]. Has < 34m nodules - x few stable 2012 - June 2014. BUt she isvery scared of lung cancer. Somone in church dx with cancer. Feels she will benefit from another CT. Asymptomatic  - ASthma Mild Persistent basedon [facility] test  - Supposed tob e on QVAR but not taking. Giving hx of mild intermittent well controlled symptoms. REports being uptodate with flu shot    Past, Family, Social reviewed: has had hip surgery and is due for knee surgery. Tolerated it well   Immunization History  Administered Date(s) Administered  . Influenza Split 03/13/2011, 11/18/2011, 02/24/2013  . Influenza,inj,Quad PF,36+ Mos 01/18/2014  . Influenza-Unspecified 12/08/2011  . Pneumococcal Conjugate-13 01/18/2014  . Pneumococcal Polysaccharide-23 03/13/2011  . Zoster 01/28/2012        Review of Systems  Constitutional: Negative for fever and unexpected weight change.  HENT: Negative for congestion, dental  problem, ear pain, nosebleeds, postnasal drip, rhinorrhea, sinus pressure, sneezing, sore throat and trouble swallowing.   Eyes: Negative for redness and itching.  Respiratory: Negative for cough, chest tightness, shortness of breath and wheezing.   Cardiovascular: Negative for palpitations and leg swelling.  Gastrointestinal: Negative for nausea and vomiting.  Genitourinary: Negative for dysuria.  Musculoskeletal: Negative for joint swelling.  Skin: Negative for rash.  Neurological: Negative for headaches.  Hematological: Does not bruise/bleed easily.  Psychiatric/Behavioral: Negative for dysphoric mood. The patient is not nervous/anxious.    Current outpatient prescriptions: albuterol (PROVENTIL HFA;VENTOLIN HFA) 108 (90 BASE) MCG/ACT inhaler, Inhale 1-2 puffs into the lungs every 6 (six) hours as needed for wheezing., Disp: 1 Inhaler, Rfl: 0;  Cholecalciferol (VITAMIN D3) 10000 UNITS capsule, Take 10,000 Units by mouth every morning., Disp: , Rfl:  cyclobenzaprine (FLEXERIL) 10 MG tablet, Take 1 tablet (10 mg total) by mouth 2 (two) times daily as needed for muscle spasms., Disp: 180 tablet, Rfl: 1;  diclofenac (VOLTAREN) 75 MG EC tablet, Take 1 tablet (75 mg total) by mouth 2 (two) times daily., Disp: 180 tablet, Rfl: 1;  escitalopram (LEXAPRO) 10 MG tablet, Take 10 mg by mouth at bedtime., Disp: , Rfl:  fentaNYL (DURAGESIC) 50 MCG/HR, Place 1 patch (50 mcg total) onto the skin every 3 (three) days., Disp: 10 patch, Rfl: 0;  fluticasone (FLONASE) 50 MCG/ACT nasal spray, Place 2 sprays into both nostrils daily as needed for allergies or rhinitis., Disp: , Rfl: ;  HYDROcodone-acetaminophen (NORCO) 10-325 MG per tablet, Take 1-2 tablets by mouth every 6 (six) hours as needed for moderate pain. , Disp: , Rfl:  lamoTRIgine (LAMICTAL) 100 MG tablet, Take 1 tablet (100 mg total) by mouth at bedtime., Disp: 90 tablet, Rfl: 1;  levothyroxine (SYNTHROID, LEVOTHROID) 125 MCG tablet, Take 125 mcg by mouth  daily before breakfast., Disp: , Rfl: ;  LORazepam (ATIVAN) 1 MG tablet, Take 1 mg by mouth at bedtime., Disp: , Rfl: ;  ondansetron (ZOFRAN) 4 MG tablet, Take 4 mg by mouth every 8 (eight) hours as needed for nausea or vomiting., Disp: , Rfl:  rizatriptan (MAXALT) 10 MG tablet, Take 1 tablet (10 mg total) by mouth once as needed for migraine. May repeat in 2 hours if needed, Disp: 36 tablet, Rfl: 2  Objective:   Physical Exam  Constitutional: She is oriented to person, place, and time. She appears well-developed and well-nourished. No distress.  HENT:  Head: Normocephalic and atraumatic.  Right Ear: External ear normal.  Left Ear: External ear normal.  Mouth/Throat: Oropharynx is clear and moist. No oropharyngeal exudate.  Eyes: Conjunctivae and EOM are normal. Pupils are equal, round, and reactive to light. Right eye exhibits no discharge. Left eye exhibits no discharge. No scleral icterus.  Neck: Normal range of motion. Neck supple. No JVD present. No tracheal deviation present. No thyromegaly present.  Cardiovascular: Normal rate, regular rhythm, normal heart sounds and intact distal pulses.  Exam reveals no gallop and no friction rub.   No murmur heard. Pulmonary/Chest: Effort normal and breath sounds normal. No respiratory distress. She has no wheezes. She has no rales. She exhibits no tenderness.  Abdominal: Soft. Bowel sounds are normal. She exhibits no distension and no mass. There is no tenderness. There is no rebound and no guarding.  Musculoskeletal: Normal range of motion. She exhibits no edema or tenderness.  Lymphadenopathy:    She has no cervical adenopathy.  Neurological: She is alert and oriented to person, place, and time. She has normal reflexes. No cranial nerve deficit. She exhibits normal muscle tone. Coordination normal.  Skin: Skin is warm and dry. No rash noted. She is not diaphoretic. No erythema. No pallor.  Psychiatric: She has a normal mood and affect. Her  behavior is normal. Judgment and thought content normal.  Vitals reviewed.   Filed Vitals:   02/06/14 0926  BP: 122/72  Pulse: 80  Height: 5' (1.524 m)  Weight: 142 lb (64.411 kg)  SpO2: 98%         Assessment & Plan:     ICD-9-CM ICD-10-CM   1. Lung nodule 793.11 R91.1 CT Chest Wo Contrast  2. Asthma, mild intermittent, uncomplicated 871.95 V74.71 CT Chest Wo Contrast    #Lung nodule and hx of smoking  - do CT chest wo contrast; will call with results  #Mild Intermittent asthma  - glad is stable  - use albuterol as needed  - respect need to avoid QVAR  - glad uptodate with flu shot  #Followup 6 months or sooner if needed    (> 50% of this 15 min visit spent in face to face counseling)    Dr. Brand Males, M.D., [facility].C.P Pulmonary and Critical Care Medicine Staff Physician [facility] Pager: [phone], If no answer or between  15:00h - 7:00h: call [phone]  02/06/2014 9:54 AM

## 2014-02-09 ENCOUNTER — Ambulatory Visit (INDEPENDENT_AMBULATORY_CARE_PROVIDER_SITE_OTHER)
Admission: RE | Admit: 2014-02-09 | Discharge: 2014-02-09 | Disposition: A | Discharge status: Home / Self Care | Payer: Medicare Other | Source: Ambulatory Visit | Attending: Internal Medicine | Admitting: Internal Medicine

## 2014-02-09 DIAGNOSIS — R911 Solitary pulmonary nodule: Secondary | ICD-10-CM | POA: Diagnosis not present

## 2014-02-09 DIAGNOSIS — J4520 Mild intermittent asthma, uncomplicated: Secondary | ICD-10-CM | POA: Diagnosis not present

## 2014-02-09 DIAGNOSIS — R918 Other nonspecific abnormal finding of lung field: Secondary | ICD-10-CM | POA: Diagnosis not present

## 2014-02-10 NOTE — Anesthesia Preprocedure Evaluation (Addendum)
Anesthesia Evaluation  Patient identified by MRN, date of birth, ID band Patient awake    Reviewed: Allergy & Precautions, H&P , NPO status , Patient's Chart, lab work & pertinent test results  History of Anesthesia Complications Negative for: history of anesthetic complications  Airway Mallampati: II  TM Distance: >3 FB Neck ROM: Full    Dental  (+) Dental Advisory Given, Chipped, Poor Dentition   Pulmonary shortness of breath and with exertion, asthma , former smoker,  Cleared by pulmonology, will do preop albuterol neb breath sounds clear to auscultation  Pulmonary exam normal       Cardiovascular negative cardio ROS  Rhythm:Regular Rate:Normal     Neuro/Psych  Headaches, PSYCHIATRIC DISORDERS Anxiety Depression Bipolar Disorder TIAnegative psych ROS   GI/Hepatic Neg liver ROS, GERD-  Medicated and Controlled,  Endo/Other  Hypothyroidism   Renal/GU negative Renal ROS  negative genitourinary   Musculoskeletal  (+) Arthritis -, Osteoarthritis,  Fibromyalgia -, narcotic dependentscoliosis   Abdominal   Peds negative pediatric ROS (+)  Hematology  (+) anemia ,   Anesthesia Other Findings   Reproductive/Obstetrics negative OB ROS                            Anesthesia Physical Anesthesia Plan  ASA: II  Anesthesia Plan: Spinal   Post-op Pain Management:    Induction: Intravenous  Airway Management Planned: Nasal Cannula  Additional Equipment:   Intra-op Plan:   Post-operative Plan: Extubation in OR  Informed Consent: I have reviewed the patients History and Physical, chart, labs and discussed the procedure including the risks, benefits and alternatives for the proposed anesthesia with the patient or authorized representative who has indicated his/her understanding and acceptance.   Dental advisory given  Plan Discussed with: CRNA  Anesthesia Plan Comments:          Anesthesia Quick Evaluation

## 2014-02-12 ENCOUNTER — Ambulatory Visit (HOSPITAL_COMMUNITY): Payer: Medicare Other | Admitting: Anesthesiology

## 2014-02-12 ENCOUNTER — Encounter (HOSPITAL_COMMUNITY)
Admission: RE | Disposition: A | Discharge status: Home / Self Care | Payer: Self-pay | Source: Ambulatory Visit | Attending: Orthopedic Surgery

## 2014-02-12 ENCOUNTER — Observation Stay (HOSPITAL_COMMUNITY)
Admission: RE | Admit: 2014-02-12 | Discharge: 2014-02-13 | Disposition: A | Discharge status: Home / Self Care | Payer: Medicare Other | Source: Ambulatory Visit | Attending: Orthopedic Surgery | Admitting: Orthopedic Surgery

## 2014-02-12 ENCOUNTER — Encounter (HOSPITAL_COMMUNITY): Payer: Self-pay | Admitting: *Deleted

## 2014-02-12 DIAGNOSIS — Z96641 Presence of right artificial hip joint: Secondary | ICD-10-CM | POA: Diagnosis not present

## 2014-02-12 DIAGNOSIS — M797 Fibromyalgia: Secondary | ICD-10-CM | POA: Insufficient documentation

## 2014-02-12 DIAGNOSIS — G43909 Migraine, unspecified, not intractable, without status migrainosus: Secondary | ICD-10-CM | POA: Insufficient documentation

## 2014-02-12 DIAGNOSIS — Z8673 Personal history of transient ischemic attack (TIA), and cerebral infarction without residual deficits: Secondary | ICD-10-CM | POA: Insufficient documentation

## 2014-02-12 DIAGNOSIS — K219 Gastro-esophageal reflux disease without esophagitis: Secondary | ICD-10-CM | POA: Insufficient documentation

## 2014-02-12 DIAGNOSIS — M179 Osteoarthritis of knee, unspecified: Secondary | ICD-10-CM | POA: Diagnosis not present

## 2014-02-12 DIAGNOSIS — M109 Gout, unspecified: Secondary | ICD-10-CM | POA: Diagnosis not present

## 2014-02-12 DIAGNOSIS — Z85828 Personal history of other malignant neoplasm of skin: Secondary | ICD-10-CM | POA: Insufficient documentation

## 2014-02-12 DIAGNOSIS — E785 Hyperlipidemia, unspecified: Secondary | ICD-10-CM | POA: Diagnosis not present

## 2014-02-12 DIAGNOSIS — M419 Scoliosis, unspecified: Secondary | ICD-10-CM | POA: Diagnosis not present

## 2014-02-12 DIAGNOSIS — E039 Hypothyroidism, unspecified: Secondary | ICD-10-CM | POA: Insufficient documentation

## 2014-02-12 DIAGNOSIS — M1712 Unilateral primary osteoarthritis, left knee: Secondary | ICD-10-CM | POA: Diagnosis not present

## 2014-02-12 DIAGNOSIS — J45909 Unspecified asthma, uncomplicated: Secondary | ICD-10-CM | POA: Insufficient documentation

## 2014-02-12 DIAGNOSIS — F3181 Bipolar II disorder: Secondary | ICD-10-CM | POA: Insufficient documentation

## 2014-02-12 DIAGNOSIS — Z96652 Presence of left artificial knee joint: Secondary | ICD-10-CM

## 2014-02-12 DIAGNOSIS — Z87891 Personal history of nicotine dependence: Secondary | ICD-10-CM | POA: Diagnosis not present

## 2014-02-12 DIAGNOSIS — F419 Anxiety disorder, unspecified: Secondary | ICD-10-CM | POA: Insufficient documentation

## 2014-02-12 HISTORY — PX: PARTIAL KNEE ARTHROPLASTY: SHX2174

## 2014-02-12 LAB — TYPE AND SCREEN
ABO/RH(D): AB POS
ANTIBODY SCREEN: NEGATIVE

## 2014-02-12 SURGERY — ARTHROPLASTY, KNEE, UNICOMPARTMENTAL
Anesthesia: Spinal | Site: Knee | Laterality: Left

## 2014-02-12 MED ORDER — ONDANSETRON HCL 4 MG/2ML IJ SOLN: 4.0000 mg | Freq: Four times a day (QID) | INTRAMUSCULAR | Status: DC | PRN

## 2014-02-12 MED ORDER — PROPOFOL INFUSION 10 MG/ML OPTIME
INTRAVENOUS | Status: DC | PRN
  Administered 2014-02-12: 75 ug/kg/min via INTRAVENOUS

## 2014-02-12 MED ORDER — SODIUM CHLORIDE 0.9 % IJ SOLN
INTRAMUSCULAR | Status: DC | PRN
  Administered 2014-02-12: 29 mL

## 2014-02-12 MED ORDER — BISACODYL 10 MG RE SUPP: 10.0000 mg | Freq: Every day | RECTAL | Status: DC | PRN

## 2014-02-12 MED ORDER — CLINDAMYCIN PHOSPHATE 900 MG/50ML IV SOLN
900.0000 mg | INTRAVENOUS | Status: AC
  Administered 2014-02-12: 900 mg via INTRAVENOUS

## 2014-02-12 MED ORDER — ESCITALOPRAM OXALATE 10 MG PO TABS
10.0000 mg | ORAL_TABLET | Freq: Every day | ORAL | Status: DC
  Administered 2014-02-12: 10 mg via ORAL
  Filled 2014-02-12 (×2): qty 1

## 2014-02-12 MED ORDER — METHOCARBAMOL 500 MG PO TABS
500.0000 mg | ORAL_TABLET | Freq: Four times a day (QID) | ORAL | Status: DC | PRN
  Administered 2014-02-12 – 2014-02-13 (×3): 500 mg via ORAL
  Filled 2014-02-12 (×3): qty 1

## 2014-02-12 MED ORDER — PROPOFOL 10 MG/ML IV BOLUS
INTRAVENOUS | Status: AC
  Filled 2014-02-12: qty 20

## 2014-02-12 MED ORDER — LEVOTHYROXINE SODIUM 125 MCG PO TABS
125.0000 ug | ORAL_TABLET | Freq: Every day | ORAL | Status: DC
  Administered 2014-02-13: 125 ug via ORAL
  Filled 2014-02-12 (×2): qty 1

## 2014-02-12 MED ORDER — METHOCARBAMOL 1000 MG/10ML IJ SOLN
500.0000 mg | Freq: Four times a day (QID) | INTRAVENOUS | Status: DC | PRN
  Administered 2014-02-12: 500 mg via INTRAVENOUS
  Filled 2014-02-12 (×2): qty 5

## 2014-02-12 MED ORDER — RIVAROXABAN 10 MG PO TABS
10.0000 mg | ORAL_TABLET | ORAL | Status: DC
  Administered 2014-02-13: 10 mg via ORAL
  Filled 2014-02-12 (×3): qty 1

## 2014-02-12 MED ORDER — KETOROLAC TROMETHAMINE 30 MG/ML IJ SOLN
INTRAMUSCULAR | Status: AC
  Filled 2014-02-12: qty 1

## 2014-02-12 MED ORDER — ONDANSETRON HCL 4 MG/2ML IJ SOLN: 4.0000 mg | Freq: Once | INTRAMUSCULAR | Status: DC | PRN

## 2014-02-12 MED ORDER — CELECOXIB 200 MG PO CAPS
200.0000 mg | ORAL_CAPSULE | Freq: Two times a day (BID) | ORAL | Status: DC
Start: 2014-02-12 — End: 2014-02-13
  Administered 2014-02-12 – 2014-02-13 (×2): 200 mg via ORAL
  Filled 2014-02-12 (×3): qty 1

## 2014-02-12 MED ORDER — DIPHENHYDRAMINE HCL 25 MG PO CAPS: 25.0000 mg | ORAL_CAPSULE | Freq: Four times a day (QID) | ORAL | Status: DC | PRN

## 2014-02-12 MED ORDER — FENTANYL 50 MCG/HR TD PT72
50.0000 ug | MEDICATED_PATCH | TRANSDERMAL | Status: DC
  Administered 2014-02-12: 50 ug via TRANSDERMAL
  Filled 2014-02-12: qty 1

## 2014-02-12 MED ORDER — KETOROLAC TROMETHAMINE 30 MG/ML IJ SOLN
INTRAMUSCULAR | Status: DC | PRN
  Administered 2014-02-12: 30 mg

## 2014-02-12 MED ORDER — 0.9 % SODIUM CHLORIDE (POUR BTL) OPTIME
TOPICAL | Status: DC | PRN
  Administered 2014-02-12: 1000 mL

## 2014-02-12 MED ORDER — FERROUS SULFATE 325 (65 FE) MG PO TABS
325.0000 mg | ORAL_TABLET | Freq: Three times a day (TID) | ORAL | Status: DC
  Administered 2014-02-12 – 2014-02-13 (×3): 325 mg via ORAL
  Filled 2014-02-12 (×5): qty 1

## 2014-02-12 MED ORDER — MAGNESIUM CITRATE PO SOLN: 1.0000 | Freq: Once | ORAL | Status: AC | PRN

## 2014-02-12 MED ORDER — ONDANSETRON HCL 4 MG PO TABS: 4.0000 mg | ORAL_TABLET | Freq: Four times a day (QID) | ORAL | Status: DC | PRN

## 2014-02-12 MED ORDER — CLINDAMYCIN PHOSPHATE 900 MG/50ML IV SOLN
INTRAVENOUS | Status: AC
  Filled 2014-02-12: qty 50

## 2014-02-12 MED ORDER — ALBUTEROL SULFATE (2.5 MG/3ML) 0.083% IN NEBU: 3.0000 mL | INHALATION_SOLUTION | Freq: Four times a day (QID) | RESPIRATORY_TRACT | Status: DC | PRN

## 2014-02-12 MED ORDER — SODIUM CHLORIDE 0.9 % IJ SOLN
INTRAMUSCULAR | Status: AC
  Filled 2014-02-12: qty 50

## 2014-02-12 MED ORDER — LACTATED RINGERS IV SOLN
INTRAVENOUS | Status: AC
  Administered 2014-02-12: 1000 mL via INTRAVENOUS
  Administered 2014-02-12 (×3): via INTRAVENOUS

## 2014-02-12 MED ORDER — LORAZEPAM 1 MG PO TABS
1.0000 mg | ORAL_TABLET | Freq: Every day | ORAL | Status: DC
Start: 2014-02-12 — End: 2014-02-13
  Administered 2014-02-12: 1 mg via ORAL
  Filled 2014-02-12: qty 1

## 2014-02-12 MED ORDER — ALBUTEROL SULFATE (2.5 MG/3ML) 0.083% IN NEBU
2.5000 mg | INHALATION_SOLUTION | Freq: Once | RESPIRATORY_TRACT | Status: AC
  Administered 2014-02-12: 2.5 mg via RESPIRATORY_TRACT

## 2014-02-12 MED ORDER — HYDROMORPHONE HCL 1 MG/ML IJ SOLN
0.5000 mg | INTRAMUSCULAR | Status: DC | PRN
  Administered 2014-02-12 (×2): 1 mg via INTRAVENOUS
  Administered 2014-02-13: 0.5 mg via INTRAVENOUS
  Filled 2014-02-12 (×2): qty 1
  Filled 2014-02-12: qty 2
  Filled 2014-02-12: qty 1

## 2014-02-12 MED ORDER — BUPIVACAINE-EPINEPHRINE 0.25% -1:200000 IJ SOLN
INTRAMUSCULAR | Status: DC | PRN
  Administered 2014-02-12: 30 mL

## 2014-02-12 MED ORDER — CEFAZOLIN SODIUM-DEXTROSE 2-3 GM-% IV SOLR: 2.0000 g | INTRAVENOUS | Status: DC

## 2014-02-12 MED ORDER — PROPOFOL 10 MG/ML IV BOLUS
INTRAVENOUS | Status: DC | PRN
  Administered 2014-02-12: 30 mg via INTRAVENOUS
  Administered 2014-02-12: 10 mg via INTRAVENOUS
  Administered 2014-02-12: 20 mg via INTRAVENOUS
  Administered 2014-02-12: 10 mg via INTRAVENOUS

## 2014-02-12 MED ORDER — OXYCODONE HCL 5 MG PO TABS
5.0000 mg | ORAL_TABLET | ORAL | Status: DC
  Administered 2014-02-12: 5 mg via ORAL
  Administered 2014-02-12 – 2014-02-13 (×3): 10 mg via ORAL
  Administered 2014-02-13 (×2): 15 mg via ORAL
  Administered 2014-02-13: 10 mg via ORAL
  Filled 2014-02-12 (×2): qty 2
  Filled 2014-02-12: qty 1
  Filled 2014-02-12: qty 2
  Filled 2014-02-12: qty 3
  Filled 2014-02-12: qty 2
  Filled 2014-02-12 (×2): qty 3

## 2014-02-12 MED ORDER — BUPIVACAINE IN DEXTROSE 0.75-8.25 % IT SOLN
INTRATHECAL | Status: DC | PRN
  Administered 2014-02-12: 1.8 mL via INTRATHECAL

## 2014-02-12 MED ORDER — FENTANYL CITRATE 0.05 MG/ML IJ SOLN
INTRAMUSCULAR | Status: AC
  Filled 2014-02-12: qty 2

## 2014-02-12 MED ORDER — METOCLOPRAMIDE HCL 5 MG/ML IJ SOLN: 5.0000 mg | Freq: Three times a day (TID) | INTRAMUSCULAR | Status: DC | PRN

## 2014-02-12 MED ORDER — CLINDAMYCIN PHOSPHATE 900 MG/50ML IV SOLN
900.0000 mg | Freq: Four times a day (QID) | INTRAVENOUS | Status: AC
  Administered 2014-02-12 – 2014-02-13 (×2): 900 mg via INTRAVENOUS
  Filled 2014-02-12 (×2): qty 50

## 2014-02-12 MED ORDER — ALUM & MAG HYDROXIDE-SIMETH 200-200-20 MG/5ML PO SUSP: 30.0000 mL | ORAL | Status: DC | PRN

## 2014-02-12 MED ORDER — SODIUM CHLORIDE 0.9 % IV SOLN
INTRAVENOUS | Status: DC
  Administered 2014-02-12 – 2014-02-13 (×2): via INTRAVENOUS
  Filled 2014-02-12 (×5): qty 1000

## 2014-02-12 MED ORDER — METOCLOPRAMIDE HCL 10 MG PO TABS: 5.0000 mg | ORAL_TABLET | Freq: Three times a day (TID) | ORAL | Status: DC | PRN

## 2014-02-12 MED ORDER — SODIUM CHLORIDE 0.9 % IV SOLN
2000.0000 mg | Freq: Once | INTRAVENOUS | Status: AC
Start: 2014-02-12 — End: 2014-02-12
  Administered 2014-02-12: 2000 mg via TOPICAL
  Filled 2014-02-12: qty 20

## 2014-02-12 MED ORDER — MIDAZOLAM HCL 5 MG/5ML IJ SOLN
INTRAMUSCULAR | Status: DC | PRN
  Administered 2014-02-12: 2 mg via INTRAVENOUS

## 2014-02-12 MED ORDER — LAMOTRIGINE 100 MG PO TABS
100.0000 mg | ORAL_TABLET | Freq: Every day | ORAL | Status: DC
  Administered 2014-02-12: 100 mg via ORAL
  Filled 2014-02-12 (×2): qty 1

## 2014-02-12 MED ORDER — FENTANYL CITRATE 0.05 MG/ML IJ SOLN
INTRAMUSCULAR | Status: DC | PRN
  Administered 2014-02-12: 50 ug via INTRAVENOUS

## 2014-02-12 MED ORDER — CHLORHEXIDINE GLUCONATE 4 % EX LIQD: 60.0000 mL | Freq: Once | CUTANEOUS | Status: DC

## 2014-02-12 MED ORDER — MENTHOL 3 MG MT LOZG
1.0000 | LOZENGE | OROMUCOSAL | Status: DC | PRN
Start: 2014-02-12 — End: 2014-02-13
  Filled 2014-02-12: qty 9

## 2014-02-12 MED ORDER — ALBUTEROL SULFATE (2.5 MG/3ML) 0.083% IN NEBU
INHALATION_SOLUTION | RESPIRATORY_TRACT | Status: AC
  Filled 2014-02-12: qty 3

## 2014-02-12 MED ORDER — MIDAZOLAM HCL 2 MG/2ML IJ SOLN
INTRAMUSCULAR | Status: AC
Start: 2014-02-12 — End: 2014-02-12
  Filled 2014-02-12: qty 2

## 2014-02-12 MED ORDER — BUPIVACAINE-EPINEPHRINE (PF) 0.25% -1:200000 IJ SOLN
INTRAMUSCULAR | Status: AC
  Filled 2014-02-12: qty 30

## 2014-02-12 MED ORDER — FLUTICASONE PROPIONATE 50 MCG/ACT NA SUSP
2.0000 | Freq: Every day | NASAL | Status: DC | PRN
Start: 2014-02-13 — End: 2014-02-13
  Filled 2014-02-12: qty 16

## 2014-02-12 MED ORDER — SUMATRIPTAN SUCCINATE 100 MG PO TABS
100.0000 mg | ORAL_TABLET | ORAL | Status: DC | PRN
  Filled 2014-02-12: qty 1

## 2014-02-12 MED ORDER — PHENOL 1.4 % MT LIQD
1.0000 | OROMUCOSAL | Status: DC | PRN
  Filled 2014-02-12: qty 177

## 2014-02-12 MED ORDER — PHENYLEPHRINE HCL 10 MG/ML IJ SOLN
10.0000 mg | INTRAVENOUS | Status: DC | PRN
  Administered 2014-02-12: 40 ug/min via INTRAVENOUS

## 2014-02-12 MED ORDER — CLINDAMYCIN PHOSPHATE 900 MG/50ML IV SOLN
900.0000 mg | INTRAVENOUS | Status: DC
  Filled 2014-02-12: qty 50

## 2014-02-12 MED ORDER — POLYETHYLENE GLYCOL 3350 17 G PO PACK
17.0000 g | PACK | Freq: Two times a day (BID) | ORAL | Status: DC
  Administered 2014-02-12 – 2014-02-13 (×2): 17 g via ORAL

## 2014-02-12 MED ORDER — FENTANYL CITRATE 0.05 MG/ML IJ SOLN
25.0000 ug | INTRAMUSCULAR | Status: DC | PRN
Start: 2014-02-12 — End: 2014-02-12

## 2014-02-12 MED ORDER — DOCUSATE SODIUM 100 MG PO CAPS
100.0000 mg | ORAL_CAPSULE | Freq: Two times a day (BID) | ORAL | Status: DC
  Administered 2014-02-12 – 2014-02-13 (×2): 100 mg via ORAL

## 2014-02-12 SURGICAL SUPPLY — 55 items
BAG SPEC THK2 15X12 ZIP CLS (MISCELLANEOUS)
BAG ZIPLOCK 12X15 (MISCELLANEOUS) IMPLANT
BANDAGE ELASTIC 6 VELCRO ST LF (GAUZE/BANDAGES/DRESSINGS) ×3 IMPLANT
BANDAGE ESMARK 6X9 LF (GAUZE/BANDAGES/DRESSINGS) ×1 IMPLANT
BLADE SAW RECIPROCATING 77.5 (BLADE) ×3 IMPLANT
BLADE SAW SGTL 13.0X1.19X90.0M (BLADE) ×3 IMPLANT
BNDG CMPR 9X6 STRL LF SNTH (GAUZE/BANDAGES/DRESSINGS) ×1
BNDG ESMARK 6X9 LF (GAUZE/BANDAGES/DRESSINGS) ×3
BOWL SMART MIX CTS (DISPOSABLE) ×3 IMPLANT
CAPT KNEE PARTIAL 2 ×2 IMPLANT
CEMENT HV SMART SET (Cement) ×3 IMPLANT
CUFF TOURN SGL QUICK 34 (TOURNIQUET CUFF) ×3
CUFF TRNQT CYL 34X4X40X1 (TOURNIQUET CUFF) ×1 IMPLANT
DRAPE EXTREMITY T 121X128X90 (DRAPE) ×3 IMPLANT
DRAPE POUCH INSTRU U-SHP 10X18 (DRAPES) ×3 IMPLANT
DRAPE U-SHAPE 47X51 STRL (DRAPES) ×3 IMPLANT
DRSG AQUACEL AG ADV 3.5X10 (GAUZE/BANDAGES/DRESSINGS) ×3 IMPLANT
DRSG TEGADERM 4X4.75 (GAUZE/BANDAGES/DRESSINGS) IMPLANT
DURAPREP 26ML APPLICATOR (WOUND CARE) ×6 IMPLANT
ELECT REM PT RETURN 9FT ADLT (ELECTROSURGICAL) ×3
ELECTRODE REM PT RTRN 9FT ADLT (ELECTROSURGICAL) ×1 IMPLANT
EVACUATOR 1/8 PVC DRAIN (DRAIN) IMPLANT
FACESHIELD WRAPAROUND (MASK) ×12 IMPLANT
FACESHIELD WRAPAROUND OR TEAM (MASK) ×4 IMPLANT
GAUZE SPONGE 2X2 8PLY STRL LF (GAUZE/BANDAGES/DRESSINGS) IMPLANT
GLOVE BIOGEL PI IND STRL 7.5 (GLOVE) ×1 IMPLANT
GLOVE BIOGEL PI IND STRL 8.5 (GLOVE) ×1 IMPLANT
GLOVE BIOGEL PI INDICATOR 7.5 (GLOVE) ×2
GLOVE BIOGEL PI INDICATOR 8.5 (GLOVE) ×6
GLOVE ECLIPSE 8.0 STRL XLNG CF (GLOVE) ×1 IMPLANT
GLOVE ORTHO TXT STRL SZ7.5 (GLOVE) ×2 IMPLANT
GLOVE SURG SS PI 7.5 STRL IVOR (GLOVE) ×4 IMPLANT
GLOVE SURG SS PI 8.5 STRL IVOR (GLOVE) ×4
GLOVE SURG SS PI 8.5 STRL STRW (GLOVE) IMPLANT
GOWN SPEC L3 XXLG W/TWL (GOWN DISPOSABLE) ×3 IMPLANT
GOWN STRL REUS W/TWL LRG LVL3 (GOWN DISPOSABLE) ×3 IMPLANT
KIT BASIN OR (CUSTOM PROCEDURE TRAY) ×3 IMPLANT
LEGGING LITHOTOMY PAIR STRL (DRAPES) ×3 IMPLANT
LIQUID BAND (GAUZE/BANDAGES/DRESSINGS) ×3 IMPLANT
MANIFOLD NEPTUNE II (INSTRUMENTS) ×3 IMPLANT
NDL SAFETY ECLIPSE 18X1.5 (NEEDLE) ×1 IMPLANT
NEEDLE HYPO 18GX1.5 SHARP (NEEDLE) ×3
PACK TOTAL JOINT (CUSTOM PROCEDURE TRAY) ×3 IMPLANT
SPONGE GAUZE 2X2 STER 10/PKG (GAUZE/BANDAGES/DRESSINGS)
SUCTION FRAZIER TIP 10 FR DISP (SUCTIONS) ×3 IMPLANT
SUT MNCRL AB 4-0 PS2 18 (SUTURE) ×3 IMPLANT
SUT VIC AB 1 CT1 36 (SUTURE) ×3 IMPLANT
SUT VIC AB 2-0 CT1 27 (SUTURE) ×6
SUT VIC AB 2-0 CT1 TAPERPNT 27 (SUTURE) ×2 IMPLANT
SUT VLOC 180 0 24IN GS25 (SUTURE) ×3 IMPLANT
SYR 50ML LL SCALE MARK (SYRINGE) ×3 IMPLANT
TOWEL OR 17X26 10 PK STRL BLUE (TOWEL DISPOSABLE) ×3 IMPLANT
TOWEL OR NON WOVEN STRL DISP B (DISPOSABLE) IMPLANT
TRAY CATH 16FR W/PLASTIC CATH (SET/KITS/TRAYS/PACK) ×2 IMPLANT
TRAY FOLEY CATH 14FRSI W/METER (CATHETERS) IMPLANT

## 2014-02-12 NOTE — Interval H&P Note (Signed)
History and Physical Interval Note:  02/12/2014 10:24 AM  Jenna Contreras  has presented today for surgery, with the diagnosis of LEFT KNEE MEDIAL COMPARTEMENTAL OA   The various methods of treatment have been discussed with the patient and family. After consideration of risks, benefits and other options for treatment, the patient has consented to  Procedure(s): LEFT KNEE UNICOMPARTMENTAL MEDIALLY ARTHROPLASTY  (Left) as a surgical intervention .  The patient's history has been reviewed, patient examined, no change in status, stable for surgery.  I have reviewed the patient's chart and labs.  Questions were answered to the patient's satisfaction.     Mauri Pole

## 2014-02-12 NOTE — Op Note (Signed)
NAME: Jailine Lieder    MEDICAL RECORD NO.: [id number]   FACILITY: [facility] BIRTH: 1945/12/08  PHYSICIAN: Pietro Cassis. Alvan Dame, M.D.    DATE OF PROCEDURE: 02/12/2014    OPERATIVE REPORT   PREOPERATIVE DIAGNOSIS: Left knee medial compartment osteoarthritis.   POSTOPERATIVE DIAGNOSIS: Left knee medial compartment osteoarthritis.  PROCEDURE: Left partial knee replacement utilizing Biomet Oxford knee  component, size small femur, a left medial size AA tibial tray with a size 6 mm insert.   SURGEON: Pietro Cassis. Alvan Dame, M.D.   ASSISTANT: Danae Orleans, PAC.  Please note that Mr. Guinevere Scarlet was present for the entirety of the case,  utilized for preoperative positioning, perioperative retractor  management, general facilitation of the case and primary wound closure.   ANESTHESIA: spinal.   SPECIMENS: None.   COMPLICATIONS: None.  DRAINS: None   TOURNIQUET TIME: 34 minutes at 250 mmHg.   INDICATIONS FOR PROCEDURE: The patient is a 68 y.o. patient of mine who presented for evaluation of left knee pain.  They presented with primary complaints of pain on the medial side of their knee. Radiographs revealed advanced medial compartment arthritis with specifically an antero-medial wear pattern.  There was bone on bone changes noted with subchondral sclerosis and osteophytes present. The patient has had progressive problems failing to respond to conservative measures of medications, injections and activity modification. Risks of infection, DVT, component failure, need for future revision surgery were all discussed and reviewed.  Consent was obtained for benefit of pain relief.   PROCEDURE IN DETAIL: The patient was brought to the operative theater.  Once adequate anesthesia, preoperative antibiotics, 900mg  of Cleocin administered, the patient was positioned in supine position with a left thigh tourniquet  placed. The left lower extremity was prepped and draped in sterile  fashion with the  leg on the Oxford leg holder.  The leg was allowed to flex to 120 degrees. A time-out  was performed identifying the patient, planned procedure, and extremity.  The leg was exsanguinated, tourniquet elevated to 250 mmHg. A midline  incision was made from the proximal pole of the patella to the tibial tubercle. A  soft tissue plane was created and partial median arthrotomy was then  made to allow for subluxation of the patella. Following initial synovectomy and  debridement, the osteophytes were removed off the medial aspect of the  knee.   Attention was first directed to the tibia. The tibial  extramedullary guide was positioned over the anterior crest of the tibia  and pinned into position, and using a measured resection guide from the  [employer] system, a 4 mm resection was made off the proximal tibia. First  the reciprocating saw along the medial aspect of the tibial spines, then the oscillating saw.    At this point, I sized this cut surface seem to be best fit for a size AA tibial tray.  With the retractors out of the wound and the knee held at 90 degrees the 5 feeler gauge had appropriate tension on the medial ligament.   At this point, the femoral canal was opened with a drill and the  intramedullary rod passed. Then using the guide for a small mm resection off  the posterior aspect of the femur was positioned over the mid portion of the medial femoral condyle.  The orientation was set using the guide that mates the femoral guide to the intramedullary rod.  The 2 drill holes were made into the distal femur.  The posterior guide was  then impacted into place and the posterior  femoral cut made.  At this point, I milled the distal femur with a size 5 spigot in place. At this point, we did a trial reduction of the small femur, size AA tibial tray and a size 6 feeler gauge. At 90 degrees of  flexion and at 20 degrees of flexion the knee had symmetric tension on  the ligaments.   Given  these findings, the trial femoral component was removed. Final preparation of tibia was carried out by pinning it in position. Then  using a reciprocating saw I removed bone for the keel. Further bone was  removed with an osteotome.  Trial reduction was now carried out with the small femur, the left keeled medial AA tibia, and a 6 lollipop insert. The balance of the  ligaments appeared to be symmetric at 20 degrees and 90 degrees. Given  all these findings, the trial components were removed.   Cement was mixed. The final components were opened. The knee was irrigated with  normal saline solution. The synovial capsular junction was injected with 30cc of 0.25% Marcaine, 30cc of NS, and 1cc o Toradol.  Then final debridements of the  soft tissue was carried out, I also drilled the sclerotic bone with a drill.  The final components were cemented with a single batch of cement in a  two-stage technique with the tibial component cemented first. The knee  was then brought  to 45 degrees of flexion with a 6 feeler gauge, held with pressure for a minute and half.  After this the femoral component was cemented in place.  The knee was again held at 45 degrees of flexion while the cement fully cured.  Excess cement was removed throughout the knee. Tourniquet was let down  after 34 minutes. After the cement had fully cured and excessive cement  was removed throughout the knee there was no visualized cement present.   The final size 63mm left medial insert to match the small femur was chosen and snapped into position. We re-irrigated  the knee. I placed a medium Hemovac drain deep. The extensor mechanism  was then reapproximated using a #1 Vicryl with the knee in flexion. The  remaining wound was closed with 2-0 Vicryl and a running 4-0 Monocryl.  The knee was cleaned, dried, and dressed sterilely using Dermabond and  Aquacel dressing. The drain site was dressed separately. The patient  was brought to the  recovery room, Ace wrap in place, tolerating the  procedure well. He will be in the hospital for overnight observation.  We will initiate physical therapy and progress to ambulate.     Pietro Cassis Alvan Dame, M.D.

## 2014-02-12 NOTE — Anesthesia Procedure Notes (Addendum)
Spinal Patient location during procedure: OR Start time: 02/12/2014 11:20 AM End time: 02/12/2014 11:28 AM Staffing Anesthesiologist: Milana Obey Resident/CRNA: Anne Fu Performed by: resident/CRNA and anesthesiologist  Preanesthetic Checklist Completed: patient identified, site marked, surgical consent, pre-op evaluation, timeout performed, IV checked, risks and benefits discussed and monitors and equipment checked Spinal Block Patient position: sitting Prep: ChloraPrep Patient monitoring: heart rate, continuous pulse ox and blood pressure Approach: left paramedian Location: L2-3 Injection technique: single-shot Needle Needle type: Spinocan  Needle gauge: 22 G Needle length: 9 cm Assessment Sensory level: T6 Additional Notes Expiration date of kit checked and confirmed. Patient tolerated procedure well, without complications. X 1 attempt first per CRNA Margorie John without success, X 1 MD Jillyn Hidden with noted return of CSF easy aspiration and injection of medication.   Noted loss of motor and sensory on exam post injection.

## 2014-02-12 NOTE — Transfer of Care (Signed)
Immediate Anesthesia Transfer of Care Note  Patient: Jenna Contreras  Procedure(s) Performed: Procedure(s) (LRB): LEFT KNEE UNICOMPARTMENTAL MEDIALLY ARTHROPLASTY  (Left)  Patient Location: PACU  Anesthesia Type: Spinal  Level of Consciousness: sedated, patient cooperative and responds to stimulation  Airway & Oxygen Therapy: Patient Spontanous Breathing and Patient connected to face mask oxgen  Post-op Assessment: Report given to PACU RN and Post -op Vital signs reviewed and stable  Post vital signs: Reviewed and stable  Complications: No apparent anesthesia complications

## 2014-02-12 NOTE — Anesthesia Postprocedure Evaluation (Signed)
  Anesthesia Post-op Note  Patient: Jenna Contreras  Procedure(s) Performed: Procedure(s) (LRB): LEFT KNEE UNICOMPARTMENTAL MEDIALLY ARTHROPLASTY  (Left)  Patient Location: PACU  Anesthesia Type: Spinal  Level of Consciousness: awake and alert   Airway and Oxygen Therapy: Patient Spontanous Breathing  Post-op Pain: mild  Post-op Assessment: Post-op Vital signs reviewed, Patient's Cardiovascular Status Stable, Respiratory Function Stable, Patent Airway and No signs of Nausea or vomiting  Last Vitals:  Filed Vitals:   02/12/14 1317  BP: 107/61  Pulse: 80  Temp: 36.2 C  Resp: 15    Post-op Vital Signs: stable   Complications: No apparent anesthesia complications

## 2014-02-13 ENCOUNTER — Encounter (HOSPITAL_COMMUNITY): Payer: Self-pay | Admitting: Orthopedic Surgery

## 2014-02-13 DIAGNOSIS — M1712 Unilateral primary osteoarthritis, left knee: Secondary | ICD-10-CM | POA: Diagnosis not present

## 2014-02-13 LAB — CBC
HCT: 33.9 % — ABNORMAL LOW (ref 36.0–46.0)
Hemoglobin: 11 g/dL — ABNORMAL LOW (ref 12.0–15.0)
MCH: 29.1 pg (ref 26.0–34.0)
MCHC: 32.4 g/dL (ref 30.0–36.0)
MCV: 89.7 fL (ref 78.0–100.0)
Platelets: 213 10*3/uL (ref 150–400)
RBC: 3.78 MIL/uL — AB (ref 3.87–5.11)
RDW: 13.6 % (ref 11.5–15.5)
WBC: 7.4 10*3/uL (ref 4.0–10.5)

## 2014-02-13 LAB — BASIC METABOLIC PANEL
ANION GAP: 10 (ref 5–15)
BUN: 9 mg/dL (ref 6–23)
CO2: 26 meq/L (ref 19–32)
CREATININE: 0.65 mg/dL (ref 0.50–1.10)
Calcium: 8.3 mg/dL — ABNORMAL LOW (ref 8.4–10.5)
Chloride: 102 mEq/L (ref 96–112)
GFR calc Af Amer: >90 mL/min (ref >90)
GFR calc non Af Amer: 89 mL/min — ABNORMAL LOW (ref >90)
Glucose, Bld: 98 mg/dL (ref 70–99)
Potassium: 4.4 mEq/L (ref 3.7–5.3)
SODIUM: 138 meq/L (ref 137–147)

## 2014-02-13 MED ORDER — FERROUS SULFATE 325 (65 FE) MG PO TABS: 325.0000 mg | ORAL_TABLET | Freq: Three times a day (TID) | ORAL | Status: DC

## 2014-02-13 MED ORDER — CYCLOBENZAPRINE HCL 10 MG PO TABS: 10.0000 mg | ORAL_TABLET | Freq: Three times a day (TID) | ORAL | Status: DC | PRN

## 2014-02-13 MED ORDER — OXYCODONE HCL 5 MG PO TABS: 5.0000 mg | ORAL_TABLET | ORAL | Status: DC | PRN

## 2014-02-13 MED ORDER — POLYETHYLENE GLYCOL 3350 17 G PO PACK: 17.0000 g | PACK | Freq: Two times a day (BID) | ORAL | Status: DC

## 2014-02-13 MED ORDER — ASPIRIN EC 325 MG PO TBEC: 325.0000 mg | DELAYED_RELEASE_TABLET | Freq: Two times a day (BID) | ORAL | Status: AC

## 2014-02-13 MED ORDER — RIVAROXABAN 10 MG PO TABS: 10.0000 mg | ORAL_TABLET | ORAL | Status: DC

## 2014-02-13 MED ORDER — DSS 100 MG PO CAPS: 100.0000 mg | ORAL_CAPSULE | Freq: Two times a day (BID) | ORAL | Status: DC

## 2014-02-13 NOTE — Progress Notes (Signed)
Physical Therapy Treatment Patient Details Name: Jenna Contreras MRN: [id number] DOB: 1946/01/17 Today's Date: 02/13/2014    History of Present Illness Pt is s/p L uniknee    PT Comments    Patient able to ambulate and practice 1 step with spouse assisting. Has  Arranged caregivers, patient unable to perform ROM exercises due to pain.  palns for DC to home  Follow Up Recommendations  Home health PT (patient has a caregiver arranged.)     Equipment Recommendations  None recommended by PT    Recommendations for Other Services       Precautions / Restrictions Precautions Precautions: Fall;Knee    Mobility  Bed Mobility         Supine to sit: Supervision     General bed mobility comments: crossed RLE under L.  Pt used bedrail despite cues to try not to use it  Transfers Overall transfer level: Needs assistance Equipment used: Rolling walker (2 wheeled) Transfers: Sit to/from Stand Sit to Stand: Min guard         General transfer comment: vcs for safety, UE/LE placement and close guard for safety  Ambulation/Gait Ambulation/Gait assistance: Min assist Ambulation Distance (Feet): 25 Feet   Gait Pattern/deviations: Step-to pattern;Antalgic;Decreased step length - left;Decreased stance time - left     General Gait Details: cues for sequence, c/o 10 pain.    Stairs Stairs: Yes   Stair Management: No rails;Step to pattern;Backwards;With walker Number of Stairs: 1 General stair comments: spouse present for instruction, frequent cues for safety  Wheelchair Mobility    Modified Rankin (Stroke Patients Only)       Balance                                    Cognition Arousal/Alertness: Lethargic (awakened easily) Behavior During Therapy: Anxious Overall Cognitive Status: Within Functional Limits for tasks assessed (needs reinforcement for safety)                      Exercises Total Joint Exercises Quad Sets:  AROM;Left;10 reps;Supine    General Comments        Pertinent Vitals/Pain Pain Score: 7  Pain Location: L knee Pain Descriptors / Indicators: Aching;Tightness Pain Intervention(s): Limited activity within patient's tolerance;Premedicated before session;Repositioned    Home Living                      Prior Function            PT Goals (current goals can now be found in the care plan section) Progress towards PT goals: Progressing toward goals    Frequency  7X/week    PT Plan Discharge plan needs to be updated    Co-evaluation             End of Session   Activity Tolerance: Patient limited by pain Patient left: in chair;with call bell/phone within reach;with family/visitor present     Time: 2694-8546 PT Time Calculation (min) (ACUTE ONLY): 21 min  Charges:  $Gait Training: 8-22 mins                    G Codes:  Functional Assessment Tool Used: clinical judgement Functional Limitation: Mobility: Walking and moving around Mobility: Walking and Moving Around Discharge Status [phone]): At least 20 percent but less than 40 percent impaired, limited or restricted   [name] 02/13/2014, 6:31  PM   

## 2014-02-13 NOTE — Plan of Care (Signed)
Problem: Phase I Progression Outcomes Goal: Incision/dressings dry and intact Outcome: Completed/Met Date Met:  02/13/14

## 2014-02-13 NOTE — Progress Notes (Signed)
CSW met with pt to assist with d/c planning. Pt hopes to d/c home with [facility] services following hospital d/c. CSW is available to assist with d/c planning to SNF if pt changes her mind, and hospital status is changed to " in patient " , ( if appropriate ). Pt is presently listed as " outpatient in bed " . Medicare will not cover SNF placement with this hospital status. RNCM is assisting with d/c planning needs.  Werner Lean LCSW [phone]

## 2014-02-13 NOTE — Plan of Care (Signed)
Problem: Phase I Progression Outcomes Goal: Vital signs/hemodynamically stable Outcome: Completed/Met Date Met:  02/13/14

## 2014-02-13 NOTE — Evaluation (Addendum)
Occupational Therapy Evaluation Patient Details Name: Jenna Contreras MRN: [id number] DOB: 07/20/1945 Today's Date: 02/13/2014    History of Present Illness Pt is s/p L uniknee   Clinical Impression   Pt up to [facility] to practice with toileting with walker use. Pt needs frequent verbal cues for safety as she has decreased recall of safety recommendations. Pt is home alone and only has someone that checks in on her briefly. She had more assist last hospital stay this year. Feel she would need SNF for safety at d/c but if she does d/c home, recommend [facility], aide. Will follow on acute. If d/c home, would benefit from an additional day of therapy to practice ADL and improve independence.     Follow Up Recommendations  SNF;Supervision/Assistance - 24 hour (however, if d/c home recommend 24/7 and HHOT) Note pt is outpatient status.    Equipment Recommendations  None recommended by OT    Recommendations for Other Services       Precautions / Restrictions Precautions Precautions: Fall Restrictions Weight Bearing Restrictions: No      Mobility Bed Mobility Overal bed mobility: Needs Assistance Bed Mobility: Supine to Sit     Supine to sit: Min assist     General bed mobility comments: cues for technique.  Transfers Overall transfer level: Needs assistance Equipment used: Rolling walker (2 wheeled) Transfers: Sit to/from [facility] Sit to Stand: Min assist;+2 safety/equipment Stand pivot transfers: Min assist;+2 safety/equipment       General transfer comment: verbal cues for hand placement and LE management. Pt with decreased recall even with practice.     Balance                                            ADL Overall ADL's : Needs assistance/impaired Eating/Feeding: Independent;Sitting   Grooming: Wash/dry hands;Set up;Sitting   Upper Body Bathing: Set up;Sitting   Lower Body Bathing: +2 for safety/equipment;Moderate  assistance;Sit to/from stand   Upper Body Dressing : Set up;Sitting   Lower Body Dressing: Moderate assistance;Sit to/from stand;+2 for safety/equipment   Toilet Transfer: Minimal assistance;+2 for safety/equipment;Stand-pivot;BSC;RW Toilet Transfer Details (indicate cue type and reason): +2 for safety with low BP this am Toileting- Clothing Manipulation and Hygiene: Minimal assistance;Sit to/from stand;+2 for safety/equipment         General ADL Comments: Pt has decreased recall of safety recommendations including hand placement, LE management and requires frequent cues. +2 for safety this am to pivot to chair as pt with low BP reading this am. BP on BSC 94/51 with no complaint of dizziness. Pt was in hospital in March this year for hip replacement but states husband was at home to assist. Pt states this time for d/c she will be home alone. Feel pt will need SNF for safety.  Pt states her shower is small and would not accomodate a shower seat. She has all AE from previous surgery.      Vision                     Perception     Praxis      Pertinent Vitals/Pain Pain Assessment: 0-10 Pain Score: 10-Worst pain ever Pain Location: L knee with activity Pain Descriptors / Indicators: Aching Pain Intervention(s): Patient requesting pain meds-RN notified;Repositioned;Ice applied     Hand Dominance     Extremity/Trunk Assessment Upper Extremity Assessment  Upper Extremity Assessment: RUE deficits/detail RUE Deficits / Details: pt states arthritis is limiting in R hand. wears a soft wrist brace.            Communication Communication Communication: No difficulties   Cognition Arousal/Alertness: Awake/alert Behavior During Therapy: WFL for tasks assessed/performed Overall Cognitive Status: No family/caregiver present to determine baseline cognitive functioning       Memory: Decreased recall of precautions;Decreased short-term memory (pt with difficulty remembering  safety precautions with hand placement, LE management techniques)             General Comments       Exercises       Shoulder Instructions      Home Living Family/patient expects to be discharged to:: Private residence Living Arrangements: Alone   Type of Home: Other(Comment) (condo)             Bathroom Shower/Tub: Walk-in Psychologist, prison and probation services: [place]: Environmental consultant - 2 wheels;Bedside commode;Cane - single point;Adaptive equipment Adaptive Equipment: Reacher;Sock aid;Long-handled shoe horn;Long-handled sponge Additional Comments: pt states spouse lives in separate home and checks on her daily to see if she needs anything but she lives alone.      Prior Functioning/Environment Level of Independence: Independent        Comments: pt states getting dressed was becoming more difficult.    OT Diagnosis: Generalized weakness;Acute pain   OT Problem List: Decreased strength;Decreased knowledge of use of DME or AE;Decreased safety awareness;Decreased cognition   OT Treatment/Interventions: Self-care/ADL training;Patient/family education;Therapeutic activities;DME and/or AE instruction    OT Goals(Current goals can be found in the care plan section) Acute Rehab OT Goals Patient Stated Goal: pt wants to return to independence.  OT Goal Formulation: With patient Time For Goal Achievement: 02/20/14 Potential to Achieve Goals: Good  OT Frequency: Min 2X/week   Barriers to D/C:            Co-evaluation              End of Session Equipment Utilized During Treatment: Gait belt;Rolling walker  Activity Tolerance: Patient limited by pain Patient left: in chair;with call bell/phone within reach   Time: 0950-1030 OT Time Calculation (min): 40 min Charges:  OT General Charges $OT Visit: 1 Procedure OT Evaluation $Initial OT Evaluation Tier I: 1 Procedure OT Treatments $Self Care/Home Management : 8-22 mins $Therapeutic Activity: 8-22  mins G-Codes: OT G-codes **NOT FOR INPATIENT CLASS** Functional Assessment Tool Used: clinical judgement Functional Limitation: Self care Self Care Current Status (B6389): At least 20 percent but less than 40 percent impaired, limited or restricted Self Care Goal Status (H7342): At least 1 percent but less than 20 percent impaired, limited or restricted  [place], [place] 02/13/2014, 11:50 AM

## 2014-02-13 NOTE — Plan of Care (Signed)
Problem: Phase I Progression Outcomes Goal: OOB as tolerated unless otherwise ordered Outcome: Completed/Met Date Met:  02/13/14     

## 2014-02-13 NOTE — Progress Notes (Signed)
Patient ID: Jenna Contreras, female   DOB: 13-Dec-1945, 68 y.o.   MRN: [id number] Subjective: 1 Day Post-Op Procedure(s) (LRB): LEFT KNEE UNICOMPARTMENTAL MEDIALLY ARTHROPLASTY  (Left)    Patient reports pain as mild to moderate.  Worried about ability to progress at home, limited support  Objective:   VITALS:   Filed Vitals:   02/13/14 1015  BP: 94/59  Pulse:   Temp:   Resp:     Neurovascular intact Incision: dressing C/D/I  LABS  Recent Labs  02/13/14 0445  HGB 11.0*  HCT 33.9*  WBC 7.4  PLT 213     Recent Labs  02/13/14 0445  NA 138  K 4.4  BUN 9  CREATININE 0.65  GLUCOSE 98    No results for input(s): LABPT, INR in the last 72 hours.   Assessment/Plan: 1 Day Post-Op Procedure(s) (LRB): LEFT KNEE UNICOMPARTMENTAL MEDIALLY ARTHROPLASTY  (Left)   Advance diet Up with therapy Plan for discharge tomorrow unless unable to make arrangements at home for assistance based on PT recs Waiting to see how she progress with therapy

## 2014-02-13 NOTE — Care Management Note (Signed)
Page 1 of 2   02/13/2014     1:45:40 PM CARE MANAGEMENT NOTE 02/13/2014  Patient:  Jenna Contreras, Jenna Contreras   Account Number:  [id number]  Date Initiated:  02/13/2014  Documentation initiated by:  [facility]  Subjective/Objective Assessment:   adm: LEFT KNEE UNICOMPARTMENTAL MEDIALLY ARTHROPLASTY  (Left)     Action/Plan:   discharge planning   Anticipated DC Date:  02/13/2014   Anticipated DC Plan:  [place]  CM consult      [facility] Choice  HOME HEALTH   Choice offered to / List presented to:  C-1 Patient   DME arranged  NA      DME agency  NA     [place] arranged  HH-2 PT      [place]   Status of service:  Completed, signed off Medicare Important Message given?   (If response is "NO", the following Medicare IM given date fields will be blank) Date Medicare IM given:   Medicare IM given by:   Date Additional Medicare IM given:   Additional Medicare IM given by:    Discharge Disposition:  [place]  Per UR Regulation:    If discussed at Long Length of Stay Meetings, dates discussed:    Comments:  02/13/14 09:00 CM met wiht pt in room to offer choice of home health agency.  Pt chooses [facility] to render HHPT/OT. Cm is estranged from husband but husband is in room and states he will be taking care of her.  Pt's address [place].  Pt has both 3n1 and rolling walker and does NOT need any DME.  Referral with new address called to [facility] rep, mary.  No other CM needs were communicated.  Mariane Masters, BSN, CM [phone].

## 2014-02-13 NOTE — Discharge Instructions (Signed)
Information on my medicine - XARELTO® (Rivaroxaban) ° °This medication education was reviewed with me or my healthcare representative as part of my discharge preparation.  The pharmacist that spoke with me during my hospital stay was:  Jullian Clayson K, RPH ° °Why was Xarelto® prescribed for you? °Xarelto® was prescribed for you to reduce the risk of blood clots forming after orthopedic surgery. The medical term for these abnormal blood clots is venous thromboembolism (VTE). ° °What do you need to know about xarelto® ? °Take your Xarelto® ONCE DAILY at the same time every day. °You may take it either with or without food. ° °If you have difficulty swallowing the tablet whole, you may crush it and mix in applesauce just prior to taking your dose. ° °Take Xarelto® exactly as prescribed by your doctor and DO NOT stop taking Xarelto® without talking to the doctor who prescribed the medication.  Stopping without other VTE prevention medication to take the place of Xarelto® may increase your risk of developing a clot. ° °After discharge, you should have regular check-up appointments with your healthcare provider that is prescribing your Xarelto®.   ° °What do you do if you miss a dose? °If you miss a dose, take it as soon as you remember on the same day then continue your regularly scheduled once daily regimen the next day. Do not take two doses of Xarelto® on the same day.  ° °Important Safety Information °A possible side effect of Xarelto® is bleeding. You should call your healthcare provider right away if you experience any of the following: °  Bleeding from an injury or your nose that does not stop. °  Unusual colored urine (red or dark brown) or unusual colored stools (red or black). °  Unusual bruising for unknown reasons. °  A serious fall or if you hit your head (even if there is no bleeding). ° °Some medicines may interact with Xarelto® and might increase your risk of bleeding while on Xarelto®. To help avoid  this, consult your healthcare provider or pharmacist prior to using any new prescription or non-prescription medications, including herbals, vitamins, non-steroidal anti-inflammatory drugs (NSAIDs) and supplements. ° °This website has more information on Xarelto®: www.xarelto.com. ° ° °

## 2014-02-13 NOTE — Plan of Care (Signed)
Problem: Phase I Progression Outcomes Goal: Initial discharge plan identified Outcome: Completed/Met Date Met:  02/13/14     

## 2014-02-13 NOTE — Progress Notes (Signed)
   02/13/14 1350  PT Time Calculation  PT Start Time (ACUTE ONLY) 1202  PT Stop Time (ACUTE ONLY) 1230  PT Time Calculation (min) (ACUTE ONLY) 28 min  PT G-Codes **NOT FOR INPATIENT CLASS**  Functional Assessment Tool Used clincal judgement  Functional Limitation Mobility: Walking and moving around  Mobility: Walking and Moving Around Current Status (T6546) CK  Mobility: Walking and Moving Around Goal Status (T0354) CI  PT General Charges  $$ ACUTE PT VISIT 1 Procedure  PT Evaluation  $Initial PT Evaluation Tier I 1 Procedure  PT Treatments  $Gait Training 23-37 mins  [place] PT [phone]

## 2014-02-13 NOTE — Plan of Care (Signed)
Problem: Phase I Progression Outcomes Goal: Pain controlled with appropriate interventions Outcome: Completed/Met Date Met:  02/13/14     

## 2014-02-13 NOTE — Progress Notes (Signed)
Occupational Therapy Treatment Patient Details Name: Jenna Contreras MRN: [id number] DOB: [birth year] Today's Date: 02/13/2014    History of present illness Pt is s/p L uniknee   OT comments  Pt is progressing with OT.  Recommend 24/7 for safety.  Pt has help when she first goes home and she has a friend staying overnight with her.  Needs reinforcement for safety.    Follow Up Recommendations  Home health OT;Supervision/Assistance - 24 hour (pt is observation:  does not qualify for SNF)    Equipment Recommendations  None recommended by OT    Recommendations for Other Services      Precautions / Restrictions Precautions Precautions: Fall Restrictions Weight Bearing Restrictions: No       Mobility Bed Mobility Overal bed mobility: Needs Assistance Bed Mobility: Sit to Supine     Supine to sit: Supervision    General bed mobility comments: crossed RLE under L.  Pt used bedrail despite cues to try not to use it  Transfers Overall transfer level: Needs assistance Equipment used: Rolling walker (2 wheeled) Transfers: Sit to/from Stand Sit to Stand: Min guard        General transfer comment: vcs for safety, UE/LE placement and close guard for safety    Balance                                   ADL Overall ADL's : Needs assistance/impaired Eating/Feeding: Independent;Sitting   Grooming: Wash/dry hands;Set up;Sitting   Upper Body Bathing: Set up;Sitting   Lower Body Bathing: +2 for safety/equipment;Moderate assistance;Sit to/from stand   Upper Body Dressing : Set up;Sitting   Lower Body Dressing: Min guard;Sit to/from stand (pants with reacher)   Toilet Transfer: Min guard;Ambulation;BSC;RW  Toileting- Water quality scientist and Hygiene: Min guard;Sit to/from stand         General ADL Comments: cues for UE/LE placement and for scooting to edge of surface.  pt will have help initially when she goes home and friend will stay at night for  as long as she needs.  Cues for safety with sit to stand--not having bil UEs on RW handles.  Pt used reacher for donning pants with min cues as she was getting leg into opposite leg opening.  Cued to keep cell phone with her all the time, in case she needs assistance.  Also cued to keep non-skid socks on all the time      Vision                     Perception     Praxis      Cognition   Behavior During Therapy: Anxious Overall Cognitive Status: Within Functional Limits for tasks assessed (needs reinforcement for safety)       Memory: Decreased recall of precautions;Decreased short-term memory               Extremity/Trunk Assessment  Upper Extremity Assessment Upper Extremity Assessment: RUE deficits/detail RUE Deficits / Details: pt states arthritis is limiting in R hand. wears a soft wrist brace.            Exercises     Shoulder Instructions       General Comments      Pertinent Vitals/ Pain       Pain Score: 9  Pain Location: L knee and hip Pain Descriptors / Indicators: Aching;Sore;Tightness Pain Intervention(s): Limited activity within patient's tolerance;Monitored during session;Premedicated before  session;Repositioned  Home Living Family/patient expects to be discharged to:: Private residence Living Arrangements: Alone Available Help at Discharge: Family Type of Home: Other(Comment) Home Access: Stairs to enter Entrance Stairs-Number of Steps: 1   Home Layout: One level;Able to live on main level with bedroom/bathroom     Bathroom Shower/Tub: Occupational psychologist: Standard     Home Equipment: Environmental consultant - 2 wheels;Bedside commode;Cane - single point;Adaptive equipment Adaptive Equipment: Reacher;Sock aid;Long-handled shoe horn;Long-handled sponge Additional Comments: pt states spouse lives in separate home and checks on her daily to see if she needs anything but she lives alone.      Prior Functioning/Environment Level of  Independence: Independent        Comments: pt states getting dressed was becoming more difficult.   Frequency Min 2X/week     Progress Toward Goals  OT Goals(current goals can now be found in the care plan section)  Progress towards OT goals: Progressing toward goals  Acute Rehab OT Goals Patient Stated Goal: pt wants to return to independence.  OT Goal Formulation: With patient Time For Goal Achievement: 02/20/14 Potential to Achieve Goals: Good ADL Goals Pt Will Perform Grooming: with supervision;standing Pt Will Transfer to Toilet: with supervision;ambulating;bedside commode Pt Will Perform Toileting - Clothing Manipulation and hygiene: with supervision;sit to/from stand Additional ADL Goal #1: Pt will use AE for LB self care with supervision and walker.  Plan Discharge plan remains appropriate    Co-evaluation                 End of Session Equipment Utilized During Treatment: Gait belt;Rolling walker   Activity Tolerance Patient tolerated treatment well   Patient Left in chair;with call bell/phone within reach;with family/visitor present   Nurse Communication      Functional Assessment Tool Used: clinical judgement Functional Limitation: Self care Self Care Current Status (I2979): At least 20 percent but less than 40 percent impaired, limited or restricted Self Care Goal Status (G9211): At least 1 percent but less than 20 percent impaired, limited or restricted   Time: 1433-1510 OT Time Calculation (min): 37 min  Charges:  OT General Charges $OT Visit: 1 Procedure  OT Treatments $Self Care/Home Management : 23-37 mins   Melo Stauber 02/13/2014, 3:30 PM Lesle Chris, OTR/L [phone] 02/13/2014

## 2014-02-13 NOTE — Plan of Care (Signed)
Problem: Phase I Progression Outcomes Goal: Tubes/drains patent Outcome: Not Applicable Date Met:  25/74/93

## 2014-02-13 NOTE — Evaluation (Signed)
Physical Therapy Evaluation Patient Details Name: Jenna Contreras MRN: [id number] DOB: Nov 11, 1945 Today's Date: 02/13/2014   History of Present Illness  Pt is s/p L uniknee  Clinical Impression  Patient moving very slowly, relies on Max UE for offsetting weight on L knee. Patient has limited tolerance to activity, decreased caregiver support. Patient will benefit from  PT while in acute care, recommend 24/7 as patient at times  Is unsteady with use of RW.    Follow Up Recommendations SNF, or Home health PT with/Assistance - 24 hour    Equipment Recommendations  None recommended by PT    Recommendations for Other Services       Precautions / Restrictions Precautions Precautions: Fall Restrictions Weight Bearing Restrictions: No      Mobility  Bed Mobility Overal bed mobility: Needs Assistance Bed Mobility: Sit to Supine     Supine to sit: Min assist Sit to supine: Min assist   General bed mobility comments: support of LLE onto bed  Transfers Overall transfer level: Needs assistance Equipment used: Rolling walker (2 wheeled) Transfers: Sit to/from Stand Sit to Stand: Mod assist Stand pivot transfers: Min assist;+2 safety/equipment       General transfer comment: verbal  cues for safety, extra time to work through c/o pain.  Ambulation/Gait Ambulation/Gait assistance: Min assist;Mod assist Ambulation Distance (Feet): 25 Feet Assistive device: Rolling walker (2 wheeled) Gait Pattern/deviations: Step-to pattern;Antalgic     General Gait Details: cues for sequence, c/o 10 pain.   Stairs            Wheelchair Mobility    Modified Rankin (Stroke Patients Only)       Balance                                             Pertinent Vitals/Pain Pain Assessment: 0-10 Pain Score: 10-Worst pain ever Pain Location: L knee Pain Descriptors / Indicators: Aching;Constant Pain Intervention(s): Limited activity within patient's  tolerance;Monitored during session;Premedicated before session;Repositioned    Home Living Family/patient expects to be discharged to:: Private residence Living Arrangements: Alone Available Help at Discharge: Family Type of Home: Other(Comment) Home Access: Stairs to enter   Technical brewer of Steps: 1 Home Layout: One level;Able to live on main level with bedroom/bathroom Home Equipment: Gilford Rile - 2 wheels;Bedside commode;Cane - single point;Adaptive equipment Additional Comments: pt states spouse lives in separate home and checks on her daily to see if she needs anything but she lives alone.    Prior Function Level of Independence: Independent         Comments: pt states getting dressed was becoming more difficult.     Hand Dominance        Extremity/Trunk Assessment   Upper Extremity Assessment: RUE deficits/detail RUE Deficits / Details: pt states arthritis is limiting in R hand. wears a soft wrist brace.          Lower Extremity Assessment: LLE deficits/detail   LLE Deficits / Details: NT due to pain     Communication   Communication: No difficulties  Cognition Arousal/Alertness: Awake/alert Behavior During Therapy: Anxious Overall Cognitive Status: No family/caregiver present to determine baseline cognitive functioning       Memory: Decreased recall of precautions;Decreased short-term memory              General Comments      Exercises  Assessment/Plan    PT Assessment    PT Diagnosis Difficulty walking;Acute pain   PT Problem List    PT Treatment Interventions     PT Goals (Current goals can be found in the Care Plan section) Acute Rehab PT Goals Patient Stated Goal: pt wants to return to independence.  PT Goal Formulation: With patient Time For Goal Achievement: 02/16/14 Potential to Achieve Goals: Good    Frequency     Barriers to discharge        Co-evaluation               End of Session   Activity  Tolerance: Patient limited by pain Patient left: in bed;with call bell/phone within reach Nurse Communication: Mobility status         Time: 1202-1230 PT Time Calculation (min) (ACUTE ONLY): 28 min   Charges:   PT Evaluation $Initial PT Evaluation Tier I: 1 Procedure PT Treatments $Gait Training: 23-37 mins   PT G Codes:          [name] 02/13/2014, 1:52 PM Tresa Endo PT [phone]

## 2014-02-14 DIAGNOSIS — Z471 Aftercare following joint replacement surgery: Secondary | ICD-10-CM | POA: Diagnosis not present

## 2014-02-14 DIAGNOSIS — M1990 Unspecified osteoarthritis, unspecified site: Secondary | ICD-10-CM | POA: Diagnosis not present

## 2014-02-14 DIAGNOSIS — J4520 Mild intermittent asthma, uncomplicated: Secondary | ICD-10-CM | POA: Diagnosis not present

## 2014-02-14 DIAGNOSIS — M797 Fibromyalgia: Secondary | ICD-10-CM | POA: Diagnosis not present

## 2014-02-14 DIAGNOSIS — F419 Anxiety disorder, unspecified: Secondary | ICD-10-CM | POA: Diagnosis not present

## 2014-02-14 DIAGNOSIS — F3181 Bipolar II disorder: Secondary | ICD-10-CM | POA: Diagnosis not present

## 2014-02-15 DIAGNOSIS — M797 Fibromyalgia: Secondary | ICD-10-CM | POA: Diagnosis not present

## 2014-02-15 DIAGNOSIS — J4520 Mild intermittent asthma, uncomplicated: Secondary | ICD-10-CM | POA: Diagnosis not present

## 2014-02-15 DIAGNOSIS — Z471 Aftercare following joint replacement surgery: Secondary | ICD-10-CM | POA: Diagnosis not present

## 2014-02-15 DIAGNOSIS — F3181 Bipolar II disorder: Secondary | ICD-10-CM | POA: Diagnosis not present

## 2014-02-15 DIAGNOSIS — F419 Anxiety disorder, unspecified: Secondary | ICD-10-CM | POA: Diagnosis not present

## 2014-02-15 DIAGNOSIS — M1990 Unspecified osteoarthritis, unspecified site: Secondary | ICD-10-CM | POA: Diagnosis not present

## 2014-02-16 DIAGNOSIS — J4520 Mild intermittent asthma, uncomplicated: Secondary | ICD-10-CM | POA: Diagnosis not present

## 2014-02-16 DIAGNOSIS — F3181 Bipolar II disorder: Secondary | ICD-10-CM | POA: Diagnosis not present

## 2014-02-16 DIAGNOSIS — M797 Fibromyalgia: Secondary | ICD-10-CM | POA: Diagnosis not present

## 2014-02-16 DIAGNOSIS — Z471 Aftercare following joint replacement surgery: Secondary | ICD-10-CM | POA: Diagnosis not present

## 2014-02-16 DIAGNOSIS — F419 Anxiety disorder, unspecified: Secondary | ICD-10-CM | POA: Diagnosis not present

## 2014-02-16 DIAGNOSIS — M1990 Unspecified osteoarthritis, unspecified site: Secondary | ICD-10-CM | POA: Diagnosis not present

## 2014-02-19 DIAGNOSIS — Z471 Aftercare following joint replacement surgery: Secondary | ICD-10-CM | POA: Diagnosis not present

## 2014-02-19 DIAGNOSIS — J4520 Mild intermittent asthma, uncomplicated: Secondary | ICD-10-CM | POA: Diagnosis not present

## 2014-02-19 DIAGNOSIS — M1990 Unspecified osteoarthritis, unspecified site: Secondary | ICD-10-CM | POA: Diagnosis not present

## 2014-02-19 DIAGNOSIS — F419 Anxiety disorder, unspecified: Secondary | ICD-10-CM | POA: Diagnosis not present

## 2014-02-19 DIAGNOSIS — F3181 Bipolar II disorder: Secondary | ICD-10-CM | POA: Diagnosis not present

## 2014-02-19 DIAGNOSIS — M797 Fibromyalgia: Secondary | ICD-10-CM | POA: Diagnosis not present

## 2014-02-19 NOTE — Discharge Summary (Signed)
Physician Discharge Summary  Patient ID: Jenna Contreras MRN: [id number] DOB/AGE: [birth year] 68 y.o.  Admit date: 02/12/2014 Discharge date: 02/13/2014   Procedures:  Procedure(s) (LRB): LEFT KNEE UNICOMPARTMENTAL MEDIALLY ARTHROPLASTY  (Left)  Attending Physician:  Dr. 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 Scoliosis   . Migraines   . Arthritis   . Hypothyroidism   . Fibromyalgia   . Depression   . OCD (obsessive compulsive disorder)   . Arthritis   . Spinal pain   . Osteoporosis   . Psoriasis   . History of skin cancer   . Constipation   . GERD (gastroesophageal reflux disease)   . Anxiety   . Bipolar 2 disorder   . Low BP   . History of gout   . HNP (herniated nucleus pulposus with myelopathy), thoracic   . Cancer     HX SKIN CANCER  . Asthma     NO CURRENT PROBLEMS    HPI: Jenna Contreras, 68 y.o. female, has a history of pain and functional disability in the left knee due to arthritis and has failed non-surgical conservative treatments for greater than 12 weeks to includeNSAID's and/or analgesics, corticosteriod injections and activity modification. Onset of symptoms was gradual, starting 3+ years ago with gradually worsening course since that time. The patient noted prior procedures on the knee to include arthroscopy on the left knee(s). Patient currently rates pain in the left knee(s) at 10 out of 10 with activity. Patient has night pain, worsening of pain with activity and weight bearing, pain that interferes with activities of daily living, pain with passive range of motion, crepitus and joint swelling. Patient has evidence of periarticular osteophytes and joint space narrowing by imaging studies. There is no active infection. Risks, benefits and expectations were discussed with the patient.  Risks including but not limited to the risk of anesthesia, blood clots, nerve damage, blood vessel damage, failure of the prosthesis, infection and up to and including death. Patient understand the risks, benefits and expectations and wishes to proceed with surgery.   PCP: Sallee Lange, MD   Discharged Condition: good  Hospital Course:  Patient underwent the above stated procedure on 02/12/2014. Patient tolerated the procedure well and brought to the recovery room in good condition and subsequently to the floor.  POD #1 BP: 95/57 ; Pulse: 79 ; Temp: 99.1 F (37.3 C) ; Resp: 16 Patient reports pain as mild to moderate. Worried about ability to progress at home, limited support.  Ready to be discharged home. Dorsiflexion/plantar flexion intact, incision: dressing C/D/I, no cellulitis present and compartment soft.   LABS  Basename    HGB  11.0  HCT  33.9    Discharge Exam: General appearance: alert, cooperative and no distress Extremities: Homans sign is negative, no sign of DVT, no edema, redness or tenderness in the calves or thighs and no ulcers, gangrene or trophic changes  Disposition: Home with follow up in 2 weeks   Follow-up Information    Follow up with Mauri Pole, MD. Schedule an appointment as soon as possible for a visit in 2 weeks.   Specialty:  Orthopedic Surgery   Contact information:   [address] [place] [address] [phone]       Follow up with [facility].   Why:  home health physical therapy  Contact information:   [address] [place] [address] [phone]       Discharge Instructions    Call MD / Call 911    Complete by:  As directed   If you experience chest pain or shortness of breath, CALL 911 and be transported to the hospital emergency room.  If you develope a fever above 101 F, pus (white drainage) or increased drainage or redness at the wound, or calf pain, call your surgeon's office.       Change dressing    Complete by:  As directed   Maintain surgical dressing for 10-14 days, or until follow up in the clinic.     Constipation Prevention    Complete by:  As directed   Drink plenty of fluids.  Prune juice may be helpful.  You may use a stool softener, such as Colace (over the counter) 100 mg twice a day.  Use MiraLax (over the counter) for constipation as needed.     Diet - low sodium heart healthy    Complete by:  As directed      Discharge instructions    Complete by:  As directed   Maintain surgical dressing for 10-14 days, or until follow up in the clinic. Follow up in 2 weeks at [facility]. Call with any questions or concerns.     Increase activity slowly as tolerated    Complete by:  As directed      TED hose    Complete by:  As directed   Use stockings (TED hose) for 2 weeks on both leg(s).  You may remove them at night for sleeping.     Weight bearing as tolerated    Complete by:  As directed   Laterality:  left  Extremity:  Lower             Medication List    STOP taking these medications        diclofenac 75 MG EC tablet  Commonly known as:  VOLTAREN     HYDROcodone-acetaminophen 10-325 MG per tablet  Commonly known as:  NORCO      TAKE these medications        albuterol 108 (90 BASE) MCG/ACT inhaler  Commonly known as:  PROVENTIL HFA;VENTOLIN HFA  Inhale 1-2 puffs into the lungs every 6 (six) hours as needed for wheezing.     aspirin EC 325 MG tablet  Take 1 tablet (325 mg total) by mouth 2 (two) times daily. Take for 4 weeks.  Start taking on:  02/28/2014  Notes to Patient:   Blood thinner. Don't take until Xarelto is finished.      cyclobenzaprine 10 MG tablet  Commonly known as:  FLEXERIL  Take 1 tablet (10 mg total) by mouth 3 (three) times daily as needed for muscle spasms.  Notes to Patient:  Muscle relaxer      DSS 100 MG Caps  Take 100 mg by mouth 2 (two) times daily.  Notes to Patient:  Stool softner      escitalopram 10 MG tablet  Commonly known as:  LEXAPRO  Take 10 mg by mouth at bedtime.     fentaNYL 50 MCG/HR  Commonly known as:  DURAGESIC  Place 1 patch (50 mcg total) onto the skin every 3 (three) days.     ferrous sulfate 325 (65 FE) MG tablet  Take 1 tablet (325 mg total) by mouth 3 (three) times daily after meals.  fluticasone 50 MCG/ACT nasal spray  Commonly known as:  FLONASE  Place 2 sprays into both nostrils daily as needed for allergies or rhinitis.     lamoTRIgine 100 MG tablet  Commonly known as:  LAMICTAL  Take 1 tablet (100 mg total) by mouth at bedtime.     levothyroxine 125 MCG tablet  Commonly known as:  SYNTHROID, LEVOTHROID  Take 125 mcg by mouth daily before breakfast.     LORazepam 1 MG tablet  Commonly known as:  ATIVAN  Take 1 mg by mouth at bedtime.     ondansetron 4 MG tablet  Commonly known as:  ZOFRAN  Take 4 mg by mouth every 8 (eight) hours as needed for nausea or vomiting.  Notes to Patient:  Nausea     oxyCODONE 5 MG immediate release tablet  Commonly known as:  Oxy IR/ROXICODONE  Take 1-3 tablets (5-15 mg total) by mouth every 4 (four) hours as needed for severe pain.     polyethylene glycol packet  Commonly known as:  MIRALAX / GLYCOLAX  Take 17 g by mouth 2 (two) times daily.     rivaroxaban 10 MG Tabs tablet  Commonly known as:  XARELTO  Take 1 tablet (10 mg total) by mouth daily.  Notes to Patient:  Blood thinner. WHEN FINISHED START THE ASPRIN 325 MG TWICE A DAY.     rizatriptan 10 MG tablet  Commonly known as:  MAXALT  Take 1 tablet (10 mg total) by mouth once as needed for migraine. May repeat in 2 hours if needed  Notes to Patient:  As needed.     Vitamin D3 10000 UNITS capsule  Take 10,000 Units by mouth every morning.         Signed: [place] Pugh. Numa Heatwole   PA-C  02/19/2014, 2:19 PM

## 2014-02-21 DIAGNOSIS — F3181 Bipolar II disorder: Secondary | ICD-10-CM | POA: Diagnosis not present

## 2014-02-21 DIAGNOSIS — F419 Anxiety disorder, unspecified: Secondary | ICD-10-CM | POA: Diagnosis not present

## 2014-02-21 DIAGNOSIS — M1990 Unspecified osteoarthritis, unspecified site: Secondary | ICD-10-CM | POA: Diagnosis not present

## 2014-02-21 DIAGNOSIS — J4520 Mild intermittent asthma, uncomplicated: Secondary | ICD-10-CM | POA: Diagnosis not present

## 2014-02-21 DIAGNOSIS — Z471 Aftercare following joint replacement surgery: Secondary | ICD-10-CM | POA: Diagnosis not present

## 2014-02-21 DIAGNOSIS — M797 Fibromyalgia: Secondary | ICD-10-CM | POA: Diagnosis not present

## 2014-02-22 ENCOUNTER — Telehealth: Payer: Self-pay | Admitting: Internal Medicine

## 2014-02-22 NOTE — Telephone Encounter (Signed)
Result Note     All nodules are very small < 82mm and unchanged since 2012. None of them are cancer. No further CT needed  --  I spoke with patient about results and she verbalized understanding and had no questions

## 2014-02-22 NOTE — Progress Notes (Signed)
Quick Note:  lmtcb for pt. ______ 

## 2014-02-23 DIAGNOSIS — F3181 Bipolar II disorder: Secondary | ICD-10-CM | POA: Diagnosis not present

## 2014-02-23 DIAGNOSIS — M797 Fibromyalgia: Secondary | ICD-10-CM | POA: Diagnosis not present

## 2014-02-23 DIAGNOSIS — F419 Anxiety disorder, unspecified: Secondary | ICD-10-CM | POA: Diagnosis not present

## 2014-02-23 DIAGNOSIS — M1990 Unspecified osteoarthritis, unspecified site: Secondary | ICD-10-CM | POA: Diagnosis not present

## 2014-02-23 DIAGNOSIS — Z471 Aftercare following joint replacement surgery: Secondary | ICD-10-CM | POA: Diagnosis not present

## 2014-02-23 DIAGNOSIS — J4520 Mild intermittent asthma, uncomplicated: Secondary | ICD-10-CM | POA: Diagnosis not present

## 2014-02-27 DIAGNOSIS — M1712 Unilateral primary osteoarthritis, left knee: Secondary | ICD-10-CM | POA: Diagnosis not present

## 2014-03-07 DIAGNOSIS — M1712 Unilateral primary osteoarthritis, left knee: Secondary | ICD-10-CM | POA: Diagnosis not present

## 2014-03-12 DIAGNOSIS — M1712 Unilateral primary osteoarthritis, left knee: Secondary | ICD-10-CM | POA: Diagnosis not present

## 2014-03-14 DIAGNOSIS — M1712 Unilateral primary osteoarthritis, left knee: Secondary | ICD-10-CM | POA: Diagnosis not present

## 2014-03-16 DIAGNOSIS — M1712 Unilateral primary osteoarthritis, left knee: Secondary | ICD-10-CM | POA: Diagnosis not present

## 2014-03-23 DIAGNOSIS — M1712 Unilateral primary osteoarthritis, left knee: Secondary | ICD-10-CM | POA: Diagnosis not present

## 2014-03-27 DIAGNOSIS — M1712 Unilateral primary osteoarthritis, left knee: Secondary | ICD-10-CM | POA: Diagnosis not present

## 2014-03-28 DIAGNOSIS — Z96652 Presence of left artificial knee joint: Secondary | ICD-10-CM | POA: Diagnosis not present

## 2014-03-28 DIAGNOSIS — Z96641 Presence of right artificial hip joint: Secondary | ICD-10-CM | POA: Diagnosis not present

## 2014-03-28 DIAGNOSIS — Z471 Aftercare following joint replacement surgery: Secondary | ICD-10-CM | POA: Diagnosis not present

## 2014-03-29 DIAGNOSIS — M1712 Unilateral primary osteoarthritis, left knee: Secondary | ICD-10-CM | POA: Diagnosis not present

## 2014-04-03 DIAGNOSIS — M1712 Unilateral primary osteoarthritis, left knee: Secondary | ICD-10-CM | POA: Diagnosis not present

## 2014-04-06 ENCOUNTER — Emergency Department (INDEPENDENT_AMBULATORY_CARE_PROVIDER_SITE_OTHER)
Admission: EM | Admit: 2014-04-06 | Discharge: 2014-04-06 | Disposition: A | Discharge status: Home / Self Care | Payer: Medicare Other | Source: Home / Self Care | Attending: Family Medicine | Admitting: Family Medicine

## 2014-04-06 ENCOUNTER — Encounter (HOSPITAL_COMMUNITY): Payer: Self-pay | Admitting: Emergency Medicine

## 2014-04-06 ENCOUNTER — Emergency Department (INDEPENDENT_AMBULATORY_CARE_PROVIDER_SITE_OTHER): Payer: Medicare Other

## 2014-04-06 DIAGNOSIS — J40 Bronchitis, not specified as acute or chronic: Secondary | ICD-10-CM

## 2014-04-06 DIAGNOSIS — R0982 Postnasal drip: Secondary | ICD-10-CM

## 2014-04-06 DIAGNOSIS — M1712 Unilateral primary osteoarthritis, left knee: Secondary | ICD-10-CM | POA: Diagnosis not present

## 2014-04-06 DIAGNOSIS — J9809 Other diseases of bronchus, not elsewhere classified: Secondary | ICD-10-CM | POA: Diagnosis not present

## 2014-04-06 MED ORDER — ALBUTEROL SULFATE HFA 108 (90 BASE) MCG/ACT IN AERS: 2.0000 | INHALATION_SPRAY | RESPIRATORY_TRACT | Status: DC | PRN

## 2014-04-06 NOTE — Discharge Instructions (Signed)
Upper Respiratory Infection, Adult An upper respiratory infection (URI) is also sometimes known as the common cold. The upper respiratory tract includes the nose, sinuses, throat, trachea, and bronchi. Bronchi are the airways leading to the lungs. Most people improve within 1 week, but symptoms can last up to 2 weeks. A residual cough may last even longer.  CAUSES Many different viruses can infect the tissues lining the upper respiratory tract. The tissues become irritated and inflamed and often become very moist. Mucus production is also common. A cold is contagious. You can easily spread the virus to others by oral contact. This includes kissing, sharing a glass, coughing, or sneezing. Touching your mouth or nose and then touching a surface, which is then touched by another person, can also spread the virus. SYMPTOMS  Symptoms typically develop 1 to 3 days after you come in contact with a cold virus. Symptoms vary from person to person. They may include:  Runny nose.  Sneezing.  Nasal congestion.  Sinus irritation.  Sore throat.  Loss of voice (laryngitis).  Cough.  Fatigue.  Muscle aches.  Loss of appetite.  Headache.  Low-grade fever. DIAGNOSIS  You might diagnose your own cold based on familiar symptoms, since most people get a cold 2 to 3 times a year. Your caregiver can confirm this based on your exam. Most importantly, your caregiver can check that your symptoms are not due to another disease such as strep throat, sinusitis, pneumonia, asthma, or epiglottitis. Blood tests, throat tests, and X-rays are not necessary to diagnose a common cold, but they may sometimes be helpful in excluding other more serious diseases. Your caregiver will decide if any further tests are required. RISKS AND COMPLICATIONS  You may be at risk for a more severe case of the common cold if you smoke cigarettes, have chronic heart disease (such as heart failure) or lung disease (such as asthma), or if  you have a weakened immune system. The very young and very old are also at risk for more serious infections. Bacterial sinusitis, middle ear infections, and bacterial pneumonia can complicate the common cold. The common cold can worsen asthma and chronic obstructive pulmonary disease (COPD). Sometimes, these complications can require emergency medical care and may be life-threatening. PREVENTION  The best way to protect against getting a cold is to practice good hygiene. Avoid oral or hand contact with people with cold symptoms. Wash your hands often if contact occurs. There is no clear evidence that vitamin C, vitamin E, echinacea, or exercise reduces the chance of developing a cold. However, it is always recommended to get plenty of rest and practice good nutrition. TREATMENT  Treatment is directed at relieving symptoms. There is no cure. Antibiotics are not effective, because the infection is caused by a virus, not by bacteria. Treatment may include:  Increased fluid intake. Sports drinks offer valuable electrolytes, sugars, and fluids.  Breathing heated mist or steam (vaporizer or shower).  Eating chicken soup or other clear broths, and maintaining good nutrition.  Getting plenty of rest.  Using gargles or lozenges for comfort.  Controlling fevers with ibuprofen or acetaminophen as directed by your caregiver.  Increasing usage of your inhaler if you have asthma. Zinc gel and zinc lozenges, taken in the first 24 hours of the common cold, can shorten the duration and lessen the severity of symptoms. Pain medicines may help with fever, muscle aches, and throat pain. A variety of non-prescription medicines are available to treat congestion and runny nose. Your caregiver   can make recommendations and may suggest nasal or lung inhalers for other symptoms.  HOME CARE INSTRUCTIONS   Only take over-the-counter or prescription medicines for pain, discomfort, or fever as directed by your  caregiver.  Use a warm mist humidifier or inhale steam from a shower to increase air moisture. This may keep secretions moist and make it easier to breathe.  Drink enough water and fluids to keep your urine clear or pale yellow.  Rest as needed.  Return to work when your temperature has returned to normal or as your caregiver advises. You may need to stay home longer to avoid infecting others. You can also use a face mask and careful hand washing to prevent spread of the virus. SEEK MEDICAL CARE IF:   After the first few days, you feel you are getting worse rather than better.  You need your caregiver's advice about medicines to control symptoms.  You develop chills, worsening shortness of breath, or brown or red sputum. These may be signs of pneumonia.  You develop yellow or brown nasal discharge or pain in the face, especially when you bend forward. These may be signs of sinusitis.  You develop a fever, swollen neck glands, pain with swallowing, or white areas in the back of your throat. These may be signs of strep throat. SEEK IMMEDIATE MEDICAL CARE IF:   You have a fever.  You develop severe or persistent headache, ear pain, sinus pain, or chest pain.  You develop wheezing, a prolonged cough, cough up blood, or have a change in your usual mucus (if you have chronic lung disease).  You develop sore muscles or a stiff neck. Document Released: 08/19/2000 Document Revised: 05/18/2011 Document Reviewed: 05/31/2013 [employer] Patient Information 2015 [employer]. This information is not intended to replace advice given to you by your health care provider. Make sure you discuss any questions you have with your health care provider.  

## 2014-04-06 NOTE — ED Notes (Signed)
Pt states that she has had a productive cough with extreme fatigue for 2 weeks. Pt is in no acute distress at this time.

## 2014-04-06 NOTE — ED Provider Notes (Signed)
CSN: [id number]     Arrival date & time 04/06/14  1738 History   First MD Initiated Contact with Patient 04/06/14 1818     Chief Complaint  Patient presents with  . Cough  . Fatigue   (Consider location/radiation/quality/duration/timing/severity/associated sxs/prior Treatment) HPI Comments: 69 year old female with history of asthma presents with concern of pneumonia. Her symptoms include fever of 99.8 for 4 days, accompanied by cough and chronic PND. Denies shortness of breath. She states she feels drainage in the back of her throat and is able to cough up her a small amount of phlegm. She is concerned in that she has recently had left knee replacement surgery and during physical therapy she was having increased pain. Since she was told that if she had to have any dental procedures she should take antibiotics, therefore she believes that she may have an upper or lower respiratory infection that may require antibiotics for her symptoms. She appears generally well and in no distress.  Patient is a 69 y.o. female presenting with cough.  Cough Associated symptoms: fever   Associated symptoms: no chest pain, no diaphoresis, no rash, no shortness of breath, no sore throat and no wheezing     Past Medical History  Diagnosis Date  . Scoliosis   . Migraines   . Arthritis   . Hypothyroidism   . Fibromyalgia   . Depression   . OCD (obsessive compulsive disorder)   . Arthritis   . Spinal pain   . Osteoporosis   . Psoriasis   . History of skin cancer   . Constipation   . GERD (gastroesophageal reflux disease)   . Anxiety   . Bipolar 2 disorder   . Low BP   . History of gout   . HNP (herniated nucleus pulposus with myelopathy), thoracic   . Cancer     HX SKIN CANCER  . Asthma     NO CURRENT PROBLEMS   Past Surgical History  Procedure Laterality Date  . Dilation and curettage of uterus  x3  . Tubal ligation    . Arthroscopic left knee surgery    . Wrist surgery    . Mandible  surgery      X 2  . Tonsillectomy    . Total hip arthroplasty Right 05/09/2013    Procedure: RIGHT TOTAL HIP ARTHROPLASTY ANTERIOR APPROACH;  Surgeon: Mauri Pole, MD;  Location: [facility];  Service: Orthopedics;  Laterality: Right;  . Partial knee arthroplasty Left 02/12/2014    Procedure: LEFT KNEE UNICOMPARTMENTAL MEDIALLY ARTHROPLASTY ;  Surgeon: Mauri Pole, MD;  Location: [facility];  Service: Orthopedics;  Laterality: Left;   Family History  Problem Relation Age of Onset  . Heart disease Mother   . Diabetes Mother   . Colon cancer Father   . Heart disease Brother    History  Substance Use Topics  . Smoking status: Former Smoker -- 2.00 packs/day for 19 years    Types: Cigarettes    Quit date: 03/10/1979  . Smokeless tobacco: Never Used  . Alcohol Use: No     Comment: no   OB History    No data available     Review of Systems  Constitutional: Positive for fever and activity change. Negative for diaphoresis.  HENT: Positive for postnasal drip. Negative for sinus pressure and sore throat.   Respiratory: Positive for cough. Negative for shortness of breath and wheezing.   Cardiovascular: Negative for chest pain and leg swelling.  Gastrointestinal: Negative.  Musculoskeletal: Negative.   Skin: Negative for rash.  Neurological: Negative.     Allergies  Prednisone; Adhesive; Betadine; Demerol; Latex; Levaquin; Ampicillin; Cephalexin; Doxycycline; Erythromycin; Keflex; Penicillins; and Tetracyclines & related  Home Medications   Prior to Admission medications   Medication Sig Start Date End Date Taking? Authorizing Provider  albuterol (PROVENTIL HFA;VENTOLIN HFA) 108 (90 BASE) MCG/ACT inhaler Inhale 2 puffs into the lungs every 4 (four) hours as needed for wheezing or shortness of breath. 04/06/14   Janne Napoleon, NP  Cholecalciferol (VITAMIN D3) 10000 UNITS capsule Take 10,000 Units by mouth every morning.    Historical Provider, MD  cyclobenzaprine (FLEXERIL) 10 MG tablet  Take 1 tablet (10 mg total) by mouth 3 (three) times daily as needed for muscle spasms. 02/13/14   Lucille Passy Babish, PA-C  docusate sodium 100 MG CAPS Take 100 mg by mouth 2 (two) times daily. 02/13/14   Lucille Passy Babish, PA-C  escitalopram (LEXAPRO) 10 MG tablet Take 10 mg by mouth at bedtime. 10/13/13   Kathyrn Drown, MD  fentaNYL (DURAGESIC) 50 MCG/HR Place 1 patch (50 mcg total) onto the skin every 3 (three) days. 01/18/14 01/18/15  Kathyrn Drown, MD  ferrous sulfate 325 (65 FE) MG tablet Take 1 tablet (325 mg total) by mouth 3 (three) times daily after meals. 02/13/14   Lucille Passy Babish, PA-C  fluticasone (FLONASE) 50 MCG/ACT nasal spray Place 2 sprays into both nostrils daily as needed for allergies or rhinitis.    Historical Provider, MD  lamoTRIgine (LAMICTAL) 100 MG tablet Take 1 tablet (100 mg total) by mouth at bedtime. 01/18/14   Kathyrn Drown, MD  levothyroxine (SYNTHROID, LEVOTHROID) 125 MCG tablet Take 125 mcg by mouth daily before breakfast.    Historical Provider, MD  LORazepam (ATIVAN) 1 MG tablet Take 1 mg by mouth at bedtime.    Historical Provider, MD  ondansetron (ZOFRAN) 4 MG tablet Take 4 mg by mouth every 8 (eight) hours as needed for nausea or vomiting.    Historical Provider, MD  oxyCODONE (OXY IR/ROXICODONE) 5 MG immediate release tablet Take 1-3 tablets (5-15 mg total) by mouth every 4 (four) hours as needed for severe pain. 02/13/14   Lucille Passy Babish, PA-C  polyethylene glycol [facility] / GLYCOLAX) packet Take 17 g by mouth 2 (two) times daily. 02/13/14   Lucille Passy Babish, PA-C  rivaroxaban (XARELTO) 10 MG TABS tablet Take 1 tablet (10 mg total) by mouth daily. 02/13/14   Lucille Passy Babish, PA-C  rizatriptan (MAXALT) 10 MG tablet Take 1 tablet (10 mg total) by mouth once as needed for migraine. May repeat in 2 hours if needed 07/11/13 07/11/14  Kathyrn Drown, MD   BP 108/66 mmHg  Pulse 82  Temp(Src) 98.7 F (37.1 C) (Oral)  Resp 16  SpO2 96% Physical  Exam  Constitutional: She is oriented to person, place, and time. She appears well-developed and well-nourished. No distress.  HENT:  Bilateral TMs are normal Oropharynx with minor posterior pharyngeal injection. Scant clear PND. No erythema or exudates.  Eyes: Conjunctivae and EOM are normal.  Neck: Normal range of motion. Neck supple.  Cardiovascular: Normal rate, regular rhythm and normal heart sounds.   Pulmonary/Chest: Effort normal and breath sounds normal. No respiratory distress. She has no wheezes. She has no rales.  Musculoskeletal:  Right knee without erythema or swelling. Patient states that she sees no difference in the knee. No lymphangitis, drainage, bleeding, purulence or other signs of infection. Good range of  motion.  Lymphadenopathy:    She has no cervical adenopathy.  Neurological: She is alert and oriented to person, place, and time. She exhibits normal muscle tone.  Skin: Skin is warm.  Psychiatric: She has a normal mood and affect.  Nursing note and vitals reviewed.   ED Course  Procedures (including critical care time) Labs Review Labs Reviewed - No data to display  Imaging Review Dg Chest 2 View  04/06/2014   CLINICAL DATA:  68 year old female with history productive cough for the past 2 days. Fever. Fatigue.  EXAM: CHEST  2 VIEW  COMPARISON:  Chest x-ray 05/05/2013.  FINDINGS: Mild diffuse peribronchial cuffing. Lung volumes are normal. No consolidative airspace disease. No pleural effusions. No pneumothorax. No pulmonary nodule or mass noted. Pulmonary vasculature and the cardiomediastinal silhouette are within normal limits. Atherosclerosis in the thoracic aorta.  IMPRESSION: 1. Diffuse peribronchial cuffing suggestive of acute bronchitis. 2. Atherosclerosis.   Electronically Signed   By: Vinnie Langton M.D.   On: 04/06/2014 19:19     MDM   1. PND (post-nasal drip)   2. Bronchitis    Use her albuterol HFA as needed for cough spasms or shortness of  breath. You Rx written. For drainage use either Claritin, Allegra or Zyrtec. An alternative would be to use Chlor-Trimeton at night time for severe drainage. Lots of fluids. Saline nasal spray. For any worsening such as shortness of breath, fevers, increased cough, increased fatigue or malaise go promptly to the emergency department. Patient stable at discharge. She speaks complete sentences without shortness of breath or evidence of increased effort. Her posturing is relaxed.    Janne Napoleon, NP 04/06/14 1956  Janne Napoleon, NP 04/06/14 2059

## 2014-05-02 DIAGNOSIS — M1712 Unilateral primary osteoarthritis, left knee: Secondary | ICD-10-CM | POA: Diagnosis not present

## 2014-05-09 DIAGNOSIS — Z96652 Presence of left artificial knee joint: Secondary | ICD-10-CM | POA: Diagnosis not present

## 2014-05-09 DIAGNOSIS — Z471 Aftercare following joint replacement surgery: Secondary | ICD-10-CM | POA: Diagnosis not present

## 2014-06-04 ENCOUNTER — Telehealth: Payer: Self-pay | Admitting: Internal Medicine

## 2014-06-04 NOTE — Telephone Encounter (Signed)
LMTCB

## 2014-06-04 NOTE — Telephone Encounter (Signed)
Spoke with the pt and notified of recs per MR She verbalized understanding  Will call us back for ov if not improving soon

## 2014-06-04 NOTE — Telephone Encounter (Signed)
   Her allergies are - significant. Do not know what to Rx her with . She should try to fighit it off. If worse, come for acute visit or go to ER. Of these 5d levaquin might be the least harmsful (painful joints)  Allergies  Allergen Reactions  . Prednisone Other (See Comments)    Suicidal ideation  . Adhesive [Tape]     Pulls her skin off  . Betadine [Povidone Iodine] Other (See Comments)    Sets her on fire  . Demerol [Meperidine] Nausea And Vomiting  . Latex Other (See Comments)    Irritates her skin  . Levaquin [Levofloxacin In D5w]     Painful joints   . Ampicillin Rash  . Cephalexin Itching and Rash  . Doxycycline Itching and Rash  . Erythromycin Rash  . Keflex [Cephalexin] Rash  . Penicillins Itching and Rash  . Tetracyclines & Related Itching and Rash

## 2014-06-04 NOTE — Telephone Encounter (Signed)
Spoke with pt. States that she has a sinus infection and bronchitis. Reports coughing with production of white mucus and sinus congestion. Denies fever, SOB or chest tightness. Was seen at [facility] but wasn't given an antibiotic.  MR - please advise. Thanks.

## 2014-06-05 ENCOUNTER — Telehealth: Payer: Self-pay | Admitting: Family Medicine

## 2014-06-05 MED ORDER — LORAZEPAM 1 MG PO TABS: 1.0000 mg | ORAL_TABLET | Freq: Every day | ORAL | Status: DC

## 2014-06-05 MED ORDER — FENTANYL 50 MCG/HR TD PT72: 50.0000 ug | MEDICATED_PATCH | TRANSDERMAL | Status: DC

## 2014-06-05 NOTE — Telephone Encounter (Signed)
Patient has office visit scheduled for follow up in April.

## 2014-06-05 NOTE — Telephone Encounter (Signed)
May have one month refill on her medicines. Please find out from her pharmacy when she is due for her fentanyl patch and date it appropriately. In addition to this the patient has to have an office visit before we can do further pain medications beyond this prescription

## 2014-06-05 NOTE — Telephone Encounter (Signed)
Patient states she just needs fentanyl patch and lorazepam-has follow up office visit 06/21/14

## 2014-06-05 NOTE — Telephone Encounter (Signed)
For the controlled meds, last seen 01/18/14

## 2014-06-05 NOTE — Telephone Encounter (Signed)
Rx up front for patient pick up. Patient notified. 

## 2014-06-05 NOTE — Telephone Encounter (Signed)
Pt needs refills, has appt 4/14 but thinks she may be in hospital  Again by that time an not be able to make it to that appt either  She needs her meds refilled, I tried to get a list (well tried to tell her  To call her pharmacy to send them but she seemed unable to do so)  Synthroid, Lamotrigine, Lorazepam, Albuterol inhaler, Cyclobenzapin,  Diclofenac sodium, fentanyl patch

## 2014-06-13 NOTE — Progress Notes (Signed)
Please put orders in Epic surgery 06-25-14 pre op 06-15-14 Thanks

## 2014-06-14 NOTE — Progress Notes (Signed)
Surgery on 06/25/2014.  Need orders in EPIC.  Preop appoointment on 06/15/2014 at 2pm.  Thank you.

## 2014-06-14 NOTE — Patient Instructions (Signed)
Jenna Contreras  06/14/2014   Your procedure is scheduled on:  06/25/2014    Report to [facility] Main  Entrance and follow signs to               [place] at     0900 AM.  Call this number if you have problems the morning of surgery [phone]   Remember:  Do not eat food or drink liquids :After Midnight.     Take these medicines the morning of surgery with A SIP OF WATER:  Albuterol Inhaler if needed and bring, Flonase, Synthroid, Refresh if needed                                You may not have any metal on your body including hair pins and              piercings  Do not wear jewelry, make-up, lotions, powders or perfumes., deodorant.               Do not wear nail polish.  Do not shave  48 hours prior to surgery.                 Do not bring valuables to the hospital. [place].  Contacts, dentures or bridgework may not be worn into surgery.  Leave suitcase in the car. After surgery it may be brought to your room.         Special Instructions: coughing and deep breathing exercises, leg exercises               Please read over the following fact sheets you were given: _____________________________________________________________________             [facility] - Preparing for Surgery Before surgery, you can play an important role.  Because skin is not sterile, your skin needs to be as free of germs as possible.  You can reduce the number of germs on your skin by washing with CHG (chlorahexidine gluconate) soap before surgery.  CHG is an antiseptic cleaner which kills germs and bonds with the skin to continue killing germs even after washing. Please DO NOT use if you have an allergy to CHG or antibacterial soaps.  If your skin becomes reddened/irritated stop using the CHG and inform your nurse when you arrive at Short Stay. Do not shave (including legs and underarms) for at least 48 hours  prior to the first CHG shower.  You may shave your face/neck. Please follow these instructions carefully:  1.  Shower with CHG Soap the night before surgery and the  morning of Surgery.  2.  If you choose to wash your hair, wash your hair first as usual with your  normal  shampoo.  3.  After you shampoo, rinse your hair and body thoroughly to remove the  shampoo.                           4.  Use CHG as you would any other liquid soap.  You can apply chg directly  to the skin and wash  Gently with a scrungie or clean washcloth.  5.  Apply the CHG Soap to your body ONLY FROM THE NECK DOWN.   Do not use on face/ open                           Wound or open sores. Avoid contact with eyes, ears mouth and genitals (private parts).                       Wash face,  Genitals (private parts) with your normal soap.             6.  Wash thoroughly, paying special attention to the area where your surgery  will be performed.  7.  Thoroughly rinse your body with warm water from the neck down.  8.  DO NOT shower/wash with your normal soap after using and rinsing off  the CHG Soap.                9.  Pat yourself dry with a clean towel.            10.  Wear clean pajamas.            11.  Place clean sheets on your bed the night of your first shower and do not  sleep with pets. Day of Surgery : Do not apply any lotions/deodorants the morning of surgery.  Please wear clean clothes to the hospital/surgery center.  FAILURE TO FOLLOW THESE INSTRUCTIONS MAY RESULT IN THE CANCELLATION OF YOUR SURGERY PATIENT SIGNATURE_________________________________  NURSE SIGNATURE__________________________________  ________________________________________________________________________  WHAT IS A BLOOD TRANSFUSION? Blood Transfusion Information  A transfusion is the replacement of blood or some of its parts. Blood is made up of multiple cells which provide different functions.  Red blood cells carry  oxygen and are used for blood loss replacement.  White blood cells fight against infection.  Platelets control bleeding.  Plasma helps clot blood.  Other blood products are available for specialized needs, such as hemophilia or other clotting disorders. BEFORE THE TRANSFUSION  Who gives blood for transfusions?   Healthy volunteers who are fully evaluated to make sure their blood is safe. This is blood bank blood. Transfusion therapy is the safest it has ever been in the practice of medicine. Before blood is taken from a donor, a complete history is taken to make sure that person has no history of diseases nor engages in risky social behavior (examples are intravenous drug use or sexual activity with multiple partners). The donor's travel history is screened to minimize risk of transmitting infections, such as malaria. The donated blood is tested for signs of infectious diseases, such as HIV and hepatitis. The blood is then tested to be sure it is compatible with you in order to minimize the chance of a transfusion reaction. If you or a relative donates blood, this is often done in anticipation of surgery and is not appropriate for emergency situations. It takes many days to process the donated blood. RISKS AND COMPLICATIONS Although transfusion therapy is very safe and saves many lives, the main dangers of transfusion include:   Getting an infectious disease.  Developing a transfusion reaction. This is an allergic reaction to something in the blood you were given. Every precaution is taken to prevent this. The decision to have a blood transfusion has been considered carefully by your caregiver before blood is given. Blood is not given unless the benefits outweigh  the risks. AFTER THE TRANSFUSION  Right after receiving a blood transfusion, you will usually feel much better and more energetic. This is especially true if your red blood cells have gotten low (anemic). The transfusion raises the  level of the red blood cells which carry oxygen, and this usually causes an energy increase.  The nurse administering the transfusion will monitor you carefully for complications. HOME CARE INSTRUCTIONS  No special instructions are needed after a transfusion. You may find your energy is better. Speak with your caregiver about any limitations on activity for underlying diseases you may have. SEEK MEDICAL CARE IF:   Your condition is not improving after your transfusion.  You develop redness or irritation at the intravenous (IV) site. SEEK IMMEDIATE MEDICAL CARE IF:  Any of the following symptoms occur over the next 12 hours:  Shaking chills.  You have a temperature by mouth above 102 F (38.9 C), not controlled by medicine.  Chest, back, or muscle pain.  People around you feel you are not acting correctly or are confused.  Shortness of breath or difficulty breathing.  Dizziness and fainting.  You get a rash or develop hives.  You have a decrease in urine output.  Your urine turns a dark color or changes to pink, red, or brown. Any of the following symptoms occur over the next 10 days:  You have a temperature by mouth above 102 F (38.9 C), not controlled by medicine.  Shortness of breath.  Weakness after normal activity.  The white part of the eye turns yellow (jaundice).  You have a decrease in the amount of urine or are urinating less often.  Your urine turns a dark color or changes to pink, red, or brown. Document Released: 02/21/2000 Document Revised: 05/18/2011 Document Reviewed: 10/10/2007 [facility] Patient Information 2014 [place], [place].  _______________________________________________________________________

## 2014-06-15 ENCOUNTER — Encounter (HOSPITAL_COMMUNITY)
Admission: RE | Admit: 2014-06-15 | Discharge: 2014-06-15 | Disposition: A | Discharge status: Home / Self Care | Payer: Medicare Other | Source: Ambulatory Visit | Attending: Orthopedic Surgery | Admitting: Orthopedic Surgery

## 2014-06-15 ENCOUNTER — Encounter (HOSPITAL_COMMUNITY): Payer: Self-pay

## 2014-06-15 ENCOUNTER — Ambulatory Visit (HOSPITAL_COMMUNITY)
Admission: RE | Admit: 2014-06-15 | Discharge: 2014-06-15 | Disposition: A | Discharge status: Home / Self Care | Payer: Medicare Other | Source: Ambulatory Visit | Attending: Orthopedic Surgery | Admitting: Orthopedic Surgery

## 2014-06-15 DIAGNOSIS — Z01818 Encounter for other preprocedural examination: Secondary | ICD-10-CM | POA: Diagnosis not present

## 2014-06-15 DIAGNOSIS — Z01812 Encounter for preprocedural laboratory examination: Secondary | ICD-10-CM | POA: Diagnosis not present

## 2014-06-15 DIAGNOSIS — R918 Other nonspecific abnormal finding of lung field: Secondary | ICD-10-CM | POA: Diagnosis not present

## 2014-06-15 DIAGNOSIS — M1711 Unilateral primary osteoarthritis, right knee: Secondary | ICD-10-CM | POA: Diagnosis not present

## 2014-06-15 HISTORY — DX: Cardiac murmur, unspecified: R01.1

## 2014-06-15 HISTORY — DX: Unspecified injury of left eye and orbit, initial encounter: S05.92XA

## 2014-06-15 LAB — URINALYSIS, ROUTINE W REFLEX MICROSCOPIC
BILIRUBIN URINE: NEGATIVE
GLUCOSE, UA: NEGATIVE mg/dL
Hgb urine dipstick: NEGATIVE
KETONES UR: NEGATIVE mg/dL
Leukocytes, UA: NEGATIVE
Nitrite: NEGATIVE
Protein, ur: NEGATIVE mg/dL
Specific Gravity, Urine: 1.003 — ABNORMAL LOW (ref 1.005–1.030)
Urobilinogen, UA: 0.2 mg/dL (ref 0.0–1.0)
pH: 6.5 (ref 5.0–8.0)

## 2014-06-15 LAB — BASIC METABOLIC PANEL
Anion gap: 6 (ref 5–15)
BUN: 14 mg/dL (ref 6–23)
CALCIUM: 9.2 mg/dL (ref 8.4–10.5)
CO2: 27 mmol/L (ref 19–32)
CREATININE: 0.51 mg/dL (ref 0.50–1.10)
Chloride: 104 mmol/L (ref 96–112)
Glucose, Bld: 96 mg/dL (ref 70–99)
Potassium: 4 mmol/L (ref 3.5–5.1)
Sodium: 137 mmol/L (ref 135–145)

## 2014-06-15 LAB — APTT: APTT: 28 s (ref 24–37)

## 2014-06-15 LAB — CBC
HCT: 42.5 % (ref 36.0–46.0)
HEMOGLOBIN: 14.1 g/dL (ref 12.0–15.0)
MCH: 29.1 pg (ref 26.0–34.0)
MCHC: 33.2 g/dL (ref 30.0–36.0)
MCV: 87.6 fL (ref 78.0–100.0)
Platelets: 267 10*3/uL (ref 150–400)
RBC: 4.85 MIL/uL (ref 3.87–5.11)
RDW: 14.9 % (ref 11.5–15.5)
WBC: 9.8 10*3/uL (ref 4.0–10.5)

## 2014-06-15 LAB — PROTIME-INR
INR: 0.94 (ref 0.00–1.49)
Prothrombin Time: 12.6 seconds (ref 11.6–15.2)

## 2014-06-15 LAB — SURGICAL PCR SCREEN
MRSA, PCR: NEGATIVE
Staphylococcus aureus: NEGATIVE

## 2014-06-15 NOTE — Progress Notes (Signed)
Stress 2014 EPIC  02/06/14 [name] with Dr [name] in [place].

## 2014-06-15 NOTE — Progress Notes (Signed)
CXR results done 06/15/2014 faxed via EPIC to Dr Alvan Dame.

## 2014-06-16 ENCOUNTER — Encounter (HOSPITAL_COMMUNITY): Payer: Self-pay | Admitting: Emergency Medicine

## 2014-06-16 DIAGNOSIS — Z91040 Latex allergy status: Secondary | ICD-10-CM | POA: Insufficient documentation

## 2014-06-16 DIAGNOSIS — F319 Bipolar disorder, unspecified: Secondary | ICD-10-CM | POA: Insufficient documentation

## 2014-06-16 DIAGNOSIS — F419 Anxiety disorder, unspecified: Secondary | ICD-10-CM | POA: Insufficient documentation

## 2014-06-16 DIAGNOSIS — Y929 Unspecified place or not applicable: Secondary | ICD-10-CM | POA: Insufficient documentation

## 2014-06-16 DIAGNOSIS — Z79899 Other long term (current) drug therapy: Secondary | ICD-10-CM | POA: Diagnosis not present

## 2014-06-16 DIAGNOSIS — K5900 Constipation, unspecified: Secondary | ICD-10-CM | POA: Diagnosis not present

## 2014-06-16 DIAGNOSIS — J45909 Unspecified asthma, uncomplicated: Secondary | ICD-10-CM | POA: Insufficient documentation

## 2014-06-16 DIAGNOSIS — Z791 Long term (current) use of non-steroidal anti-inflammatories (NSAID): Secondary | ICD-10-CM | POA: Diagnosis not present

## 2014-06-16 DIAGNOSIS — G43909 Migraine, unspecified, not intractable, without status migrainosus: Secondary | ICD-10-CM | POA: Diagnosis not present

## 2014-06-16 DIAGNOSIS — R011 Cardiac murmur, unspecified: Secondary | ICD-10-CM | POA: Insufficient documentation

## 2014-06-16 DIAGNOSIS — Z872 Personal history of diseases of the skin and subcutaneous tissue: Secondary | ICD-10-CM | POA: Insufficient documentation

## 2014-06-16 DIAGNOSIS — Y939 Activity, unspecified: Secondary | ICD-10-CM | POA: Diagnosis not present

## 2014-06-16 DIAGNOSIS — Z7951 Long term (current) use of inhaled steroids: Secondary | ICD-10-CM | POA: Diagnosis not present

## 2014-06-16 DIAGNOSIS — Y999 Unspecified external cause status: Secondary | ICD-10-CM | POA: Insufficient documentation

## 2014-06-16 DIAGNOSIS — Z87891 Personal history of nicotine dependence: Secondary | ICD-10-CM | POA: Diagnosis not present

## 2014-06-16 DIAGNOSIS — E039 Hypothyroidism, unspecified: Secondary | ICD-10-CM | POA: Diagnosis not present

## 2014-06-16 DIAGNOSIS — Z85828 Personal history of other malignant neoplasm of skin: Secondary | ICD-10-CM | POA: Insufficient documentation

## 2014-06-16 DIAGNOSIS — T1592XA Foreign body on external eye, part unspecified, left eye, initial encounter: Secondary | ICD-10-CM | POA: Insufficient documentation

## 2014-06-16 DIAGNOSIS — W228XXA Striking against or struck by other objects, initial encounter: Secondary | ICD-10-CM | POA: Diagnosis not present

## 2014-06-16 DIAGNOSIS — S0592XA Unspecified injury of left eye and orbit, initial encounter: Secondary | ICD-10-CM | POA: Diagnosis present

## 2014-06-16 DIAGNOSIS — M419 Scoliosis, unspecified: Secondary | ICD-10-CM | POA: Diagnosis not present

## 2014-06-16 DIAGNOSIS — T1591XA Foreign body on external eye, part unspecified, right eye, initial encounter: Secondary | ICD-10-CM | POA: Diagnosis not present

## 2014-06-16 DIAGNOSIS — Z880 Allergy status to penicillin: Secondary | ICD-10-CM | POA: Insufficient documentation

## 2014-06-16 NOTE — ED Notes (Signed)
Laceration to inner canthus of left eye with a branch tonight-- states removed bark from eye. No bleeding,

## 2014-06-17 ENCOUNTER — Emergency Department (HOSPITAL_COMMUNITY)
Admission: EM | Admit: 2014-06-17 | Discharge: 2014-06-17 | Disposition: A | Discharge status: Home / Self Care | Payer: Medicare Other | Attending: Emergency Medicine | Admitting: Emergency Medicine

## 2014-06-17 DIAGNOSIS — T1592XA Foreign body on external eye, part unspecified, left eye, initial encounter: Secondary | ICD-10-CM

## 2014-06-17 MED ORDER — FLUORESCEIN SODIUM 1 MG OP STRP
1.0000 | ORAL_STRIP | Freq: Once | OPHTHALMIC | Status: AC
  Administered 2014-06-17: 1 via OPHTHALMIC
  Filled 2014-06-17: qty 1

## 2014-06-17 MED ORDER — TETRACAINE HCL 0.5 % OP SOLN
1.0000 [drp] | Freq: Once | OPHTHALMIC | Status: AC
  Administered 2014-06-17: 1 [drp] via OPHTHALMIC
  Filled 2014-06-17: qty 2

## 2014-06-17 MED ORDER — CIPROFLOXACIN HCL 0.3 % OP SOLN
1.0000 [drp] | Freq: Once | OPHTHALMIC | Status: AC
  Administered 2014-06-17: 1 [drp] via OPHTHALMIC
  Filled 2014-06-17: qty 2.5

## 2014-06-17 NOTE — ED Provider Notes (Signed)
CSN: [id number]     Arrival date & time 06/16/14  2258 History  This chart was scribed for Noemi Chapel, MD by Eustaquio Maize, ED Scribe. This patient was seen in room D34C/D34C and the patient's care was started at 2:33 AM.    Chief Complaint  Patient presents with  . Eye Injury    The history is provided by the patient. No language interpreter was used.     HPI Comments: Jenna Contreras is a 69 y.o. female who presents to the Emergency Department complaining of  left eye injury that occurred earlier tonight around 9:15 PM. Pt was bending over to place a plant down and when she poked her eye with the stem of a branch. Pt states that she could see piece of wood in her eye earlier but believes it moved around. She also complains of laceration to the right eye. Pt came to the ED due to concern for foreign body still being in eye. No bleeding is noted. Pt denies tearing, redness, changes in vision, or eye pain.   Past Medical History  Diagnosis Date  . Scoliosis   . Migraines   . Arthritis   . Hypothyroidism   . Fibromyalgia   . Depression   . OCD (obsessive compulsive disorder)   . Arthritis   . Spinal pain   . Osteoporosis   . Psoriasis   . History of skin cancer   . Constipation   . GERD (gastroesophageal reflux disease)   . Anxiety   . Bipolar 2 disorder   . Low BP   . History of gout   . HNP (herniated nucleus pulposus with myelopathy), thoracic   . Cancer     HX SKIN CANCER  . Asthma     NO CURRENT PROBLEMS  . Heart murmur     hx of as small child    Past Surgical History  Procedure Laterality Date  . Dilation and curettage of uterus  x3  . Tubal ligation    . Arthroscopic left knee surgery    . Wrist surgery    . Mandible surgery      X 2  . Tonsillectomy    . Total hip arthroplasty Right 05/09/2013    Procedure: RIGHT TOTAL HIP ARTHROPLASTY ANTERIOR APPROACH;  Surgeon: Mauri Pole, MD;  Location: [facility];  Service: Orthopedics;  Laterality: Right;  .  Partial knee arthroplasty Left 02/12/2014    Procedure: LEFT KNEE UNICOMPARTMENTAL MEDIALLY ARTHROPLASTY ;  Surgeon: Mauri Pole, MD;  Location: [facility];  Service: Orthopedics;  Laterality: Left;   Family History  Problem Relation Age of Onset  . Heart disease Mother   . Diabetes Mother   . Colon cancer Father   . Heart disease Brother    History  Substance Use Topics  . Smoking status: Former Smoker -- 2.00 packs/day for 19 years    Types: Cigarettes    Quit date: 03/10/1979  . Smokeless tobacco: Never Used  . Alcohol Use: No     Comment: no   OB History    No data available     Review of Systems  All other systems reviewed and are negative.     Allergies  Prednisone; Adhesive; Betadine; Demerol; Latex; Levaquin; Ampicillin; Cephalexin; Doxycycline; Erythromycin; Keflex; Penicillins; and Tetracyclines & related  Home Medications   Prior to Admission medications   Medication Sig Start Date End Date Taking? Authorizing Provider  albuterol (PROVENTIL HFA;VENTOLIN HFA) 108 (90 BASE) MCG/ACT inhaler Inhale 2  puffs into the lungs every 4 (four) hours as needed for wheezing or shortness of breath. 04/06/14   Janne Napoleon, NP  Artificial Tear Ointment (REFRESH P.M. OP) Apply 1 application to eye at bedtime.    Historical Provider, MD  Cholecalciferol (VITAMIN D3) 10000 UNITS capsule Take 10,000 Units by mouth every morning.    Historical Provider, MD  cyclobenzaprine (FLEXERIL) 10 MG tablet Take 1 tablet (10 mg total) by mouth 3 (three) times daily as needed for muscle spasms. 02/13/14   Danae Orleans, PA-C  diclofenac (VOLTAREN) 75 MG EC tablet Take 75 mg by mouth 2 (two) times daily.    Historical Provider, MD  docusate sodium 100 MG CAPS Take 100 mg by mouth 2 (two) times daily. Patient taking differently: Take 100 mg by mouth 2 (two) times daily as needed.  02/13/14   Danae Orleans, PA-C  fentaNYL (DURAGESIC) 50 MCG/HR Place 1 patch (50 mcg total) onto the skin every 3 (three)  days. 06/05/14 06/05/15  Kathyrn Drown, MD  ferrous sulfate 325 (65 FE) MG tablet Take 1 tablet (325 mg total) by mouth 3 (three) times daily after meals. Patient not taking: Reported on 06/14/2014 02/13/14   Danae Orleans, PA-C  fluticasone [facility]) 50 MCG/ACT nasal spray Place 2 sprays into both nostrils 2 (two) times daily.     Historical Provider, MD  lamoTRIgine (LAMICTAL) 100 MG tablet Take 1 tablet (100 mg total) by mouth at bedtime. 01/18/14   Kathyrn Drown, MD  levothyroxine (SYNTHROID, LEVOTHROID) 100 MCG tablet Take 100 mcg by mouth daily before breakfast.    Historical Provider, MD  LORazepam (ATIVAN) 1 MG tablet Take 1 tablet (1 mg total) by mouth at bedtime. 06/05/14   Kathyrn Drown, MD  ondansetron (ZOFRAN) 4 MG tablet Take 4 mg by mouth every 8 (eight) hours as needed for nausea or vomiting.    Historical Provider, MD  oxyCODONE (OXY IR/ROXICODONE) 5 MG immediate release tablet Take 1-3 tablets (5-15 mg total) by mouth every 4 (four) hours as needed for severe pain. Patient not taking: Reported on 06/14/2014 02/13/14   Danae Orleans, PA-C  polyethylene glycol (MIRALAX / Floria Raveling) packet Take 17 g by mouth 2 (two) times daily. Patient taking differently: Take 17 g by mouth daily as needed for mild constipation.  02/13/14   Danae Orleans, PA-C  Polyvinyl Alcohol-Povidone (REFRESH OP) Apply 1 drop to eye 2 (two) times daily as needed (Dry eyes).    Historical Provider, MD  rivaroxaban (XARELTO) 10 MG TABS tablet Take 1 tablet (10 mg total) by mouth daily. Patient not taking: Reported on 06/14/2014 02/13/14   Danae Orleans, PA-C  rizatriptan (MAXALT) 10 MG tablet Take 1 tablet (10 mg total) by mouth once as needed for migraine. May repeat in 2 hours if needed 07/11/13 07/11/14  Kathyrn Drown, MD   Triage Vitals: BP 126/57 mmHg  Pulse 78  Temp(Src) 98.5 F (36.9 C) (Oral)  Resp 18  SpO2 98%   Physical Exam  Constitutional: She appears well-developed and well-nourished.  HENT:  Head:  Normocephalic and atraumatic.  Eyes: Conjunctivae are normal. Pupils are equal, round, and reactive to light. Right eye exhibits no discharge. Left eye exhibits no discharge.  No consensual pain. Small organic foreign body in the left eye near the medial canthus removed with irrigation. No corneal abrasion seen under fluorescein and tet exam.   Pulmonary/Chest: Effort normal. No respiratory distress.  Neurological: She is alert. Coordination normal.  Skin: Skin is warm  and dry. No rash noted. She is not diaphoretic. No erythema.  Psychiatric: She has a normal mood and affect.  Nursing note and vitals reviewed.   ED Course  Procedures (including critical care time)  DIAGNOSTIC STUDIES: Oxygen Saturation is 98% on RA, normal by my interpretation.    COORDINATION OF CARE: 2:37 AM-Discussed treatment plan which includes woods lamp exam and prescription for antibiotics with pt at bedside and pt agreed to plan.   Labs Review Labs Reviewed - No data to display  Imaging Review Dg Chest 2 View  06/15/2014   CLINICAL DATA:  Preoperative evaluation for knee surgery.  EXAM: CHEST  2 VIEW  COMPARISON:  April 06, 2014  FINDINGS: There is a new 6 mm nodular opacity in the left apex. Lungs elsewhere are clear. Heart size and pulmonary vascularity are normal. No adenopathy. No bone lesions.  IMPRESSION: New 6 mm nodular opacity left apex. Advise noncontrast enhanced chest CT to further assess. Lungs elsewhere clear.  These results will be called to the ordering clinician or representative by the Radiologist Assistant, and communication documented in the PACS or zVision Dashboard.   Electronically Signed   By: Lowella Grip III M.D.   On: 06/15/2014 15:31    MDM   Final diagnoses:  None    The patient has a small piece of foreign body, organic material, this was irrigated from the eye, residual eye exam with fluorescein and tetracaine shows no signs of foreign bodies or corneal abrasions or  corneal injuries. The patient will be treated with a topical antibiotic, she can follow up routinely with ophthalmology, no acute injuries.  I personally performed the services described in this documentation, which was scribed in my presence. The recorded information has been reviewed and is accurate.        Noemi Chapel, MD 06/17/14 [phone]

## 2014-06-17 NOTE — Discharge Instructions (Signed)
Use the eye drops - one drop every 4 hours until you follow up with the eye doctor.  Please call your doctor for a followup appointment within 24-48 hours. When you talk to your doctor please let them know that you were seen in the emergency department and have them acquire all of your records so that they can discuss the findings with you and formulate a treatment plan to fully care for your new and ongoing problems.

## 2014-06-18 ENCOUNTER — Encounter (HOSPITAL_COMMUNITY): Payer: Self-pay

## 2014-06-18 DIAGNOSIS — S0502XA Injury of conjunctiva and corneal abrasion without foreign body, left eye, initial encounter: Secondary | ICD-10-CM | POA: Diagnosis not present

## 2014-06-18 DIAGNOSIS — H04123 Dry eye syndrome of bilateral lacrimal glands: Secondary | ICD-10-CM | POA: Diagnosis not present

## 2014-06-19 NOTE — Progress Notes (Signed)
Left Orson Slick a message regarding the fact that I had faxed on 06/15/2014 CXR report to Dr Aurea Graff fax and had not heard back anything.  Resent CXR results to [phone].
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
## 2014-06-21 ENCOUNTER — Encounter: Payer: Self-pay | Admitting: Family Medicine

## 2014-06-21 ENCOUNTER — Ambulatory Visit (INDEPENDENT_AMBULATORY_CARE_PROVIDER_SITE_OTHER): Payer: Medicare Other | Admitting: Family Medicine

## 2014-06-21 VITALS — BP 128/82 | Ht 61.25 in | Wt 137.6 lb

## 2014-06-21 DIAGNOSIS — M549 Dorsalgia, unspecified: Secondary | ICD-10-CM

## 2014-06-21 DIAGNOSIS — E038 Other specified hypothyroidism: Secondary | ICD-10-CM | POA: Diagnosis not present

## 2014-06-21 DIAGNOSIS — G8929 Other chronic pain: Secondary | ICD-10-CM | POA: Diagnosis not present

## 2014-06-21 DIAGNOSIS — M8580 Other specified disorders of bone density and structure, unspecified site: Secondary | ICD-10-CM | POA: Diagnosis not present

## 2014-06-21 MED ORDER — FENTANYL 50 MCG/HR TD PT72: 50.0000 ug | MEDICATED_PATCH | TRANSDERMAL | Status: DC

## 2014-06-21 MED ORDER — CYCLOBENZAPRINE HCL 10 MG PO TABS: 10.0000 mg | ORAL_TABLET | Freq: Two times a day (BID) | ORAL | Status: DC | PRN

## 2014-06-21 MED ORDER — DICLOFENAC SODIUM 75 MG PO TBEC: 75.0000 mg | DELAYED_RELEASE_TABLET | Freq: Two times a day (BID) | ORAL | Status: DC

## 2014-06-21 NOTE — Progress Notes (Signed)
   Subjective:    Patient ID: Jenna Contreras, female    DOB: February 19, 1946, 69 y.o.   MRN: [id number]  HPI This patient was seen today for chronic pain  The medication list was reviewed and updated.   -Compliance with pain medication: She states she is just using the patch currently not pills  The patient was advised the importance of maintaining medication and not using illegal substances with these.  Refills needed: She does need refills on the patches  The patient was educated that we can provide 3 monthly scripts for their medication, it is their responsibility to follow the instructions.  Side effects or complications from medications: She denies any side effects from the medications  Patient is aware that pain medications are meant to minimize the severity of the pain to allow their pain levels to improve to allow for better function. They are aware of that pain medications cannot totally remove their pain.  Due for UDT ( at least once per year) : Next visit        Review of Systems She relates back pain discomfort. Using the fentanyl patch but no oxycodone currently.    Objective:   Physical Exam  Lungs are clear hearts regular and lumbar area back painful tender extremities no edema skin warm dry neurologic gross normal      Assessment & Plan:  Chronic pain-prescriptions for the patches were given. Patient to follow-up in 3 months. She will be having surgery soon she was encouraged to stop all anti-inflammatories at least 10 days before surgery she was also told not to take any anti-inflammatories while on any type of blood thinner. She was also instructed to follow all direct in from the orthopedist to avoid DVTs  Patient has history of osteopenia check bone density  Patient has hypothyroidism on medication currently taking 100 g recommend checking TSH

## 2014-06-21 NOTE — Patient Instructions (Signed)

## 2014-06-22 ENCOUNTER — Encounter: Payer: Self-pay | Admitting: Family Medicine

## 2014-06-22 LAB — TSH: TSH: 1.4 u[IU]/mL (ref 0.450–4.500)

## 2014-06-25 DIAGNOSIS — M1811 Unilateral primary osteoarthritis of first carpometacarpal joint, right hand: Secondary | ICD-10-CM | POA: Diagnosis not present

## 2014-06-28 ENCOUNTER — Ambulatory Visit (HOSPITAL_COMMUNITY)
Admission: RE | Admit: 2014-06-28 | Discharge: 2014-06-28 | Disposition: A | Discharge status: Home / Self Care | Payer: Medicare Other | Source: Ambulatory Visit | Attending: Family Medicine | Admitting: Family Medicine

## 2014-06-28 ENCOUNTER — Telehealth: Payer: Self-pay | Admitting: Internal Medicine

## 2014-06-28 DIAGNOSIS — M8580 Other specified disorders of bone density and structure, unspecified site: Secondary | ICD-10-CM | POA: Diagnosis not present

## 2014-06-28 NOTE — Telephone Encounter (Signed)
Called and spoke to pt. Pt stated she has a slight cough every 2-3 days that is producing little white mucus that presented on Monday 4/18. Pt denies SOB, CP/tightness, chest congestion, f/c/s. Advised pt to call back if cough becomes more frequent or worsens with congestion or mucus production. Pt verbalized understanding and denied any further questions or concerns at this time.
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
## 2014-06-29 NOTE — Progress Notes (Signed)
Called patient with time of surgery on 07/02/2014 and arrival time of 0515am.   Patient stated she has added chlortrimeton to medication list as needed.  Instructed patient that she could take Chlortrimeton if needed am of surgery.  Patient stated that no other medications have changed.  Patient still has Hibiclens and reviewed with her shower nite before and am of surgery and patient still has instruction sheet from 06/15/2014 preop appointment.

## 2014-06-29 NOTE — Progress Notes (Addendum)
Spoke  with Orson Slick at office of Dr Alvan Dame. Dr Alvan Dame is ok with CXR from 06/15/2014.  Last telephone note on 06/28/2014 shared with Orson Slick.  Will show CXR to anesthesia and let Judeen Hammans know if there is a problem. Dr Landry Dyke has seen 2V CXR results from 06/15/2014 along with CXR done 04/06/2014 and CT chest done 02/2014.  No new orders given.

## 2014-06-29 NOTE — Consult Note (Signed)
Brief Consult Note: Diagnosis: 69 yr  old female with chronci back pain for 10 yrs on pain meds,admitted to psych untit [facility] fro suicidal ideation.   Patient was seen by consultant.   Consult note dictated.   Orders entered.   Comments: 69 yr old female with chronic back pain;resume home meds,start pain patch,oxycodone 2.hypothyroidism;;hold synthyroid, tsh low,recheck TSH in one month 3.migraines;use maxalt PRN h/o depression/suicidal ideation;psych following.  Electronic Signatures: Epifanio Lesches (MD)  (Signed 18-Apr-14 10:54)  Authored: Brief Consult Note   Last Updated: 18-Apr-14 10:54 by Epifanio Lesches (MD)
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
## 2014-07-02 ENCOUNTER — Encounter (HOSPITAL_COMMUNITY): Payer: Self-pay | Admitting: *Deleted

## 2014-07-02 ENCOUNTER — Encounter (HOSPITAL_COMMUNITY)
Admission: RE | Disposition: A | Discharge status: Home / Self Care | Payer: Self-pay | Source: Ambulatory Visit | Attending: Orthopedic Surgery

## 2014-07-02 ENCOUNTER — Ambulatory Visit (HOSPITAL_COMMUNITY): Payer: Medicare Other | Admitting: Anesthesiology

## 2014-07-02 ENCOUNTER — Observation Stay (HOSPITAL_COMMUNITY)
Admission: RE | Admit: 2014-07-02 | Discharge: 2014-07-03 | Disposition: A | Discharge status: Home / Self Care | Payer: Medicare Other | Source: Ambulatory Visit | Attending: Orthopedic Surgery | Admitting: Orthopedic Surgery

## 2014-07-02 DIAGNOSIS — J45909 Unspecified asthma, uncomplicated: Secondary | ICD-10-CM | POA: Diagnosis not present

## 2014-07-02 DIAGNOSIS — F419 Anxiety disorder, unspecified: Secondary | ICD-10-CM | POA: Diagnosis not present

## 2014-07-02 DIAGNOSIS — K219 Gastro-esophageal reflux disease without esophagitis: Secondary | ICD-10-CM | POA: Diagnosis not present

## 2014-07-02 DIAGNOSIS — G43909 Migraine, unspecified, not intractable, without status migrainosus: Secondary | ICD-10-CM | POA: Diagnosis not present

## 2014-07-02 DIAGNOSIS — M25561 Pain in right knee: Secondary | ICD-10-CM | POA: Insufficient documentation

## 2014-07-02 DIAGNOSIS — F3181 Bipolar II disorder: Secondary | ICD-10-CM | POA: Diagnosis not present

## 2014-07-02 DIAGNOSIS — M797 Fibromyalgia: Secondary | ICD-10-CM | POA: Insufficient documentation

## 2014-07-02 DIAGNOSIS — E039 Hypothyroidism, unspecified: Secondary | ICD-10-CM | POA: Insufficient documentation

## 2014-07-02 DIAGNOSIS — Z87891 Personal history of nicotine dependence: Secondary | ICD-10-CM | POA: Diagnosis not present

## 2014-07-02 DIAGNOSIS — G8918 Other acute postprocedural pain: Secondary | ICD-10-CM | POA: Diagnosis not present

## 2014-07-02 DIAGNOSIS — M1711 Unilateral primary osteoarthritis, right knee: Principal | ICD-10-CM | POA: Insufficient documentation

## 2014-07-02 DIAGNOSIS — M179 Osteoarthritis of knee, unspecified: Secondary | ICD-10-CM | POA: Diagnosis not present

## 2014-07-02 DIAGNOSIS — D649 Anemia, unspecified: Secondary | ICD-10-CM | POA: Insufficient documentation

## 2014-07-02 DIAGNOSIS — Z96651 Presence of right artificial knee joint: Secondary | ICD-10-CM

## 2014-07-02 DIAGNOSIS — Z96659 Presence of unspecified artificial knee joint: Secondary | ICD-10-CM

## 2014-07-02 HISTORY — PX: PARTIAL KNEE ARTHROPLASTY: SHX2174

## 2014-07-02 LAB — TYPE AND SCREEN
ABO/RH(D): AB POS
ANTIBODY SCREEN: NEGATIVE

## 2014-07-02 SURGERY — ARTHROPLASTY, KNEE, UNICOMPARTMENTAL
Anesthesia: Spinal | Site: Knee | Laterality: Right

## 2014-07-02 MED ORDER — MENTHOL 3 MG MT LOZG
1.0000 | LOZENGE | OROMUCOSAL | Status: DC | PRN
Start: 2014-07-02 — End: 2014-07-03

## 2014-07-02 MED ORDER — BUPIVACAINE-EPINEPHRINE (PF) 0.25% -1:200000 IJ SOLN
INTRAMUSCULAR | Status: AC
  Filled 2014-07-02: qty 30

## 2014-07-02 MED ORDER — PROPOFOL INFUSION 10 MG/ML OPTIME
INTRAVENOUS | Status: DC | PRN
  Administered 2014-07-02: 120 ug/kg/min via INTRAVENOUS

## 2014-07-02 MED ORDER — MAGNESIUM CITRATE PO SOLN: 1.0000 | Freq: Once | ORAL | Status: AC | PRN

## 2014-07-02 MED ORDER — METOCLOPRAMIDE HCL 5 MG/ML IJ SOLN
5.0000 mg | Freq: Three times a day (TID) | INTRAMUSCULAR | Status: DC | PRN
Start: 2014-07-02 — End: 2014-07-03

## 2014-07-02 MED ORDER — LORAZEPAM 1 MG PO TABS
1.0000 mg | ORAL_TABLET | Freq: Every day | ORAL | Status: DC
  Administered 2014-07-02: 1 mg via ORAL
  Filled 2014-07-02: qty 1

## 2014-07-02 MED ORDER — PROPOFOL 10 MG/ML IV BOLUS
INTRAVENOUS | Status: AC
  Filled 2014-07-02: qty 20

## 2014-07-02 MED ORDER — CLINDAMYCIN PHOSPHATE 900 MG/50ML IV SOLN
INTRAVENOUS | Status: AC
  Filled 2014-07-02: qty 50

## 2014-07-02 MED ORDER — MIDAZOLAM HCL 2 MG/2ML IJ SOLN
INTRAMUSCULAR | Status: AC
  Filled 2014-07-02: qty 2

## 2014-07-02 MED ORDER — OXYCODONE HCL 5 MG PO TABS
5.0000 mg | ORAL_TABLET | ORAL | Status: DC | PRN
  Administered 2014-07-02 – 2014-07-03 (×8): 10 mg via ORAL
  Filled 2014-07-02 (×8): qty 2

## 2014-07-02 MED ORDER — HYDROMORPHONE HCL 1 MG/ML IJ SOLN
0.5000 mg | INTRAMUSCULAR | Status: DC | PRN
  Administered 2014-07-02: 0.5 mg via INTRAVENOUS
  Administered 2014-07-03 (×2): 1 mg via INTRAVENOUS
  Administered 2014-07-03: 0.5 mg via INTRAVENOUS
  Filled 2014-07-02 (×4): qty 1

## 2014-07-02 MED ORDER — POLYVINYL ALCOHOL 1.4 % OP SOLN
1.0000 [drp] | Freq: Every day | OPHTHALMIC | Status: DC
  Administered 2014-07-02: 1 [drp] via OPHTHALMIC
  Filled 2014-07-02: qty 15

## 2014-07-02 MED ORDER — DIPHENHYDRAMINE HCL 25 MG PO CAPS
25.0000 mg | ORAL_CAPSULE | Freq: Four times a day (QID) | ORAL | Status: DC | PRN
  Administered 2014-07-02 – 2014-07-03 (×2): 25 mg via ORAL
  Filled 2014-07-02 (×5): qty 1

## 2014-07-02 MED ORDER — DEXAMETHASONE SODIUM PHOSPHATE 10 MG/ML IJ SOLN
INTRAMUSCULAR | Status: AC
  Filled 2014-07-02: qty 1

## 2014-07-02 MED ORDER — CELECOXIB 200 MG PO CAPS
200.0000 mg | ORAL_CAPSULE | Freq: Two times a day (BID) | ORAL | Status: DC
  Administered 2014-07-03: 200 mg via ORAL
  Filled 2014-07-02 (×4): qty 1

## 2014-07-02 MED ORDER — 0.9 % SODIUM CHLORIDE (POUR BTL) OPTIME
TOPICAL | Status: DC | PRN
  Administered 2014-07-02: 1000 mL

## 2014-07-02 MED ORDER — BUPIVACAINE IN DEXTROSE 0.75-8.25 % IT SOLN
INTRATHECAL | Status: DC | PRN
  Administered 2014-07-02: 1.5 mL via INTRATHECAL

## 2014-07-02 MED ORDER — SODIUM CHLORIDE 0.9 % IJ SOLN
INTRAMUSCULAR | Status: AC
  Filled 2014-07-02: qty 50

## 2014-07-02 MED ORDER — ONDANSETRON HCL 4 MG/2ML IJ SOLN: 4.0000 mg | Freq: Four times a day (QID) | INTRAMUSCULAR | Status: DC | PRN

## 2014-07-02 MED ORDER — LAMOTRIGINE 100 MG PO TABS
100.0000 mg | ORAL_TABLET | Freq: Every day | ORAL | Status: DC
  Administered 2014-07-02: 100 mg via ORAL
  Filled 2014-07-02 (×2): qty 1

## 2014-07-02 MED ORDER — BISACODYL 10 MG RE SUPP: 10.0000 mg | Freq: Every day | RECTAL | Status: DC | PRN

## 2014-07-02 MED ORDER — METHOCARBAMOL 1000 MG/10ML IJ SOLN
500.0000 mg | Freq: Four times a day (QID) | INTRAVENOUS | Status: DC | PRN
  Administered 2014-07-02: 500 mg via INTRAVENOUS
  Filled 2014-07-02 (×2): qty 5

## 2014-07-02 MED ORDER — BUPIVACAINE-EPINEPHRINE 0.25% -1:200000 IJ SOLN
INTRAMUSCULAR | Status: DC | PRN
  Administered 2014-07-02: 30 mL

## 2014-07-02 MED ORDER — LEVOTHYROXINE SODIUM 100 MCG PO TABS
100.0000 ug | ORAL_TABLET | Freq: Every day | ORAL | Status: DC
  Administered 2014-07-03: 100 ug via ORAL
  Filled 2014-07-02 (×2): qty 1

## 2014-07-02 MED ORDER — SODIUM CHLORIDE 0.9 % IV SOLN
INTRAVENOUS | Status: DC
  Administered 2014-07-02: 17:00:00 via INTRAVENOUS
  Filled 2014-07-02 (×6): qty 1000

## 2014-07-02 MED ORDER — KETOROLAC TROMETHAMINE 30 MG/ML IJ SOLN
INTRAMUSCULAR | Status: DC | PRN
  Administered 2014-07-02: 30 mg

## 2014-07-02 MED ORDER — MIDAZOLAM HCL 5 MG/5ML IJ SOLN
INTRAMUSCULAR | Status: DC | PRN
  Administered 2014-07-02: 2 mg via INTRAVENOUS

## 2014-07-02 MED ORDER — METOCLOPRAMIDE HCL 10 MG PO TABS: 5.0000 mg | ORAL_TABLET | Freq: Three times a day (TID) | ORAL | Status: DC | PRN

## 2014-07-02 MED ORDER — PROMETHAZINE HCL 25 MG/ML IJ SOLN: 6.2500 mg | INTRAMUSCULAR | Status: DC | PRN

## 2014-07-02 MED ORDER — PROPOFOL 10 MG/ML IV BOLUS
INTRAVENOUS | Status: DC | PRN
  Administered 2014-07-02: 30 mg via INTRAVENOUS

## 2014-07-02 MED ORDER — ARTIFICIAL TEARS OP OINT
1.0000 "application " | TOPICAL_OINTMENT | Freq: Every day | OPHTHALMIC | Status: DC
  Filled 2014-07-02: qty 3.5

## 2014-07-02 MED ORDER — FENTANYL 50 MCG/HR TD PT72
50.0000 ug | MEDICATED_PATCH | TRANSDERMAL | Status: DC
  Administered 2014-07-02: 50 ug via TRANSDERMAL
  Filled 2014-07-02: qty 1

## 2014-07-02 MED ORDER — CLINDAMYCIN PHOSPHATE 600 MG/50ML IV SOLN
600.0000 mg | Freq: Four times a day (QID) | INTRAVENOUS | Status: AC
  Administered 2014-07-02 (×2): 600 mg via INTRAVENOUS
  Filled 2014-07-02 (×2): qty 50

## 2014-07-02 MED ORDER — CLINDAMYCIN PHOSPHATE 900 MG/50ML IV SOLN
900.0000 mg | INTRAVENOUS | Status: AC
  Administered 2014-07-02: 900 mg via INTRAVENOUS

## 2014-07-02 MED ORDER — FLUTICASONE PROPIONATE 50 MCG/ACT NA SUSP
2.0000 | Freq: Two times a day (BID) | NASAL | Status: DC
  Administered 2014-07-02: 2 via NASAL
  Filled 2014-07-02: qty 16

## 2014-07-02 MED ORDER — FENTANYL CITRATE (PF) 100 MCG/2ML IJ SOLN
INTRAMUSCULAR | Status: AC
  Filled 2014-07-02: qty 2

## 2014-07-02 MED ORDER — ALBUTEROL SULFATE (2.5 MG/3ML) 0.083% IN NEBU: 3.0000 mL | INHALATION_SOLUTION | RESPIRATORY_TRACT | Status: DC | PRN

## 2014-07-02 MED ORDER — DOCUSATE SODIUM 100 MG PO CAPS
100.0000 mg | ORAL_CAPSULE | Freq: Two times a day (BID) | ORAL | Status: DC
  Administered 2014-07-02 – 2014-07-03 (×2): 100 mg via ORAL

## 2014-07-02 MED ORDER — SODIUM CHLORIDE 0.9 % IJ SOLN
INTRAMUSCULAR | Status: DC | PRN
  Administered 2014-07-02: 30 mL

## 2014-07-02 MED ORDER — LACTATED RINGERS IV SOLN
INTRAVENOUS | Status: DC | PRN
  Administered 2014-07-02 (×3): via INTRAVENOUS

## 2014-07-02 MED ORDER — TRANEXAMIC ACID 100 MG/ML IV SOLN
1000.0000 mg | Freq: Once | INTRAVENOUS | Status: AC
  Administered 2014-07-02: 1000 mg via INTRAVENOUS
  Filled 2014-07-02: qty 10

## 2014-07-02 MED ORDER — FENTANYL CITRATE (PF) 100 MCG/2ML IJ SOLN: 25.0000 ug | INTRAMUSCULAR | Status: DC | PRN

## 2014-07-02 MED ORDER — SUMATRIPTAN SUCCINATE 100 MG PO TABS: 100.0000 mg | ORAL_TABLET | ORAL | Status: DC | PRN

## 2014-07-02 MED ORDER — ALUM & MAG HYDROXIDE-SIMETH 200-200-20 MG/5ML PO SUSP
30.0000 mL | ORAL | Status: DC | PRN
Start: 2014-07-02 — End: 2014-07-03

## 2014-07-02 MED ORDER — PHENOL 1.4 % MT LIQD: 1.0000 | OROMUCOSAL | Status: DC | PRN

## 2014-07-02 MED ORDER — FENTANYL CITRATE (PF) 100 MCG/2ML IJ SOLN
INTRAMUSCULAR | Status: DC | PRN
  Administered 2014-07-02: 50 ug via INTRAVENOUS

## 2014-07-02 MED ORDER — METHOCARBAMOL 500 MG PO TABS
500.0000 mg | ORAL_TABLET | Freq: Four times a day (QID) | ORAL | Status: DC | PRN
  Administered 2014-07-02 – 2014-07-03 (×3): 500 mg via ORAL
  Filled 2014-07-02 (×3): qty 1

## 2014-07-02 MED ORDER — FERROUS SULFATE 325 (65 FE) MG PO TABS
325.0000 mg | ORAL_TABLET | Freq: Three times a day (TID) | ORAL | Status: DC
  Administered 2014-07-03: 325 mg via ORAL
  Filled 2014-07-02 (×5): qty 1

## 2014-07-02 MED ORDER — RIVAROXABAN 10 MG PO TABS
10.0000 mg | ORAL_TABLET | Freq: Every day | ORAL | Status: DC
  Administered 2014-07-03: 10 mg via ORAL
  Filled 2014-07-02 (×2): qty 1

## 2014-07-02 MED ORDER — ONDANSETRON HCL 4 MG PO TABS: 4.0000 mg | ORAL_TABLET | Freq: Four times a day (QID) | ORAL | Status: DC | PRN

## 2014-07-02 MED ORDER — POLYETHYLENE GLYCOL 3350 17 G PO PACK
17.0000 g | PACK | Freq: Two times a day (BID) | ORAL | Status: DC
  Administered 2014-07-02 – 2014-07-03 (×2): 17 g via ORAL

## 2014-07-02 MED ORDER — KETOROLAC TROMETHAMINE 30 MG/ML IJ SOLN
INTRAMUSCULAR | Status: AC
  Filled 2014-07-02: qty 1

## 2014-07-02 MED ORDER — PHENYLEPHRINE HCL 10 MG/ML IJ SOLN
INTRAMUSCULAR | Status: DC | PRN
  Administered 2014-07-02: 40 ug via INTRAVENOUS
  Administered 2014-07-02: 80 ug via INTRAVENOUS
  Administered 2014-07-02: 120 ug via INTRAVENOUS
  Administered 2014-07-02 (×2): 40 ug via INTRAVENOUS
  Administered 2014-07-02: 80 ug via INTRAVENOUS
  Administered 2014-07-02 (×2): 120 ug via INTRAVENOUS

## 2014-07-02 MED ORDER — CHLORHEXIDINE GLUCONATE 4 % EX LIQD: 60.0000 mL | Freq: Once | CUTANEOUS | Status: DC

## 2014-07-02 MED ORDER — LACTATED RINGERS IV SOLN: INTRAVENOUS | Status: DC

## 2014-07-02 SURGICAL SUPPLY — 66 items
BAG DECANTER FOR FLEXI CONT (MISCELLANEOUS) IMPLANT
BAG SPEC THK2 15X12 ZIP CLS (MISCELLANEOUS) ×1
BAG ZIPLOCK 12X15 (MISCELLANEOUS) ×2 IMPLANT
BANDAGE ELASTIC 6 VELCRO ST LF (GAUZE/BANDAGES/DRESSINGS) ×3 IMPLANT
BANDAGE ESMARK 6X9 LF (GAUZE/BANDAGES/DRESSINGS) ×1 IMPLANT
BLADE SAW RECIPROCATING 77.5 (BLADE) ×3 IMPLANT
BLADE SAW SGTL 13.0X1.19X90.0M (BLADE) ×3 IMPLANT
BNDG CMPR 9X6 STRL LF SNTH (GAUZE/BANDAGES/DRESSINGS) ×1
BNDG ESMARK 6X9 LF (GAUZE/BANDAGES/DRESSINGS) ×3
BOWL SMART MIX CTS (DISPOSABLE) ×3 IMPLANT
CAPT KNEE PARTIAL 2 ×2 IMPLANT
CATH FOLEY LATEX FREE 16FR (CATHETERS) ×2 IMPLANT
CEMENT HV SMART SET (Cement) ×2 IMPLANT
CHLORAPREP W/TINT 26ML (MISCELLANEOUS) ×2 IMPLANT
CUFF TOURN SGL QUICK 34 (TOURNIQUET CUFF) ×3
CUFF TRNQT CYL 34X4X40X1 (TOURNIQUET CUFF) ×1 IMPLANT
DRAPE EXTREMITY T 121X128X90 (DRAPE) ×3 IMPLANT
DRAPE INCISE 23X17 IOBAN STRL (DRAPES) ×2
DRAPE INCISE 23X17 STRL (DRAPES) IMPLANT
DRAPE INCISE IOBAN 23X17 STRL (DRAPES) ×1 IMPLANT
DRAPE POUCH INSTRU U-SHP 10X18 (DRAPES) ×3 IMPLANT
DRAPE U-SHAPE 47X51 STRL (DRAPES) ×3 IMPLANT
DRSG AQUACEL AG ADV 3.5X10 (GAUZE/BANDAGES/DRESSINGS) ×3 IMPLANT
DURAPREP 26ML APPLICATOR (WOUND CARE) ×2 IMPLANT
ELECT REM PT RETURN 9FT ADLT (ELECTROSURGICAL) ×3
ELECTRODE REM PT RTRN 9FT ADLT (ELECTROSURGICAL) ×1 IMPLANT
FACESHIELD WRAPAROUND (MASK) ×9 IMPLANT
FACESHIELD WRAPAROUND OR TEAM (MASK) ×4 IMPLANT
GLOVE BIOGEL PI IND STRL 6 (GLOVE) IMPLANT
GLOVE BIOGEL PI IND STRL 7.0 (GLOVE) IMPLANT
GLOVE BIOGEL PI IND STRL 7.5 (GLOVE) ×1 IMPLANT
GLOVE BIOGEL PI IND STRL 8.5 (GLOVE) ×1 IMPLANT
GLOVE BIOGEL PI INDICATOR 6 (GLOVE) ×2
GLOVE BIOGEL PI INDICATOR 7.0 (GLOVE) ×2
GLOVE BIOGEL PI INDICATOR 7.5 (GLOVE) ×4
GLOVE BIOGEL PI INDICATOR 8.5 (GLOVE) ×2
GLOVE ECLIPSE 8.0 STRL XLNG CF (GLOVE) ×1 IMPLANT
GLOVE ORTHO TXT STRL SZ7.5 (GLOVE) ×2 IMPLANT
GLOVE SURG SS PI 6.0 STRL IVOR (GLOVE) ×2 IMPLANT
GLOVE SURG SS PI 7.5 STRL IVOR (GLOVE) ×6 IMPLANT
GLOVE SURG SS PI 8.5 STRL IVOR (GLOVE) ×2
GLOVE SURG SS PI 8.5 STRL STRW (GLOVE) IMPLANT
GOWN BRE IMP SLV AUR XL STRL (GOWN DISPOSABLE) ×2 IMPLANT
GOWN SPEC L3 XXLG W/TWL (GOWN DISPOSABLE) ×3 IMPLANT
GOWN STRL REIN 3XL XLG LVL4 (GOWN DISPOSABLE) ×2 IMPLANT
GOWN STRL REUS W/TWL LRG LVL3 (GOWN DISPOSABLE) ×3 IMPLANT
KIT BASIN OR (CUSTOM PROCEDURE TRAY) ×3 IMPLANT
LEGGING LITHOTOMY PAIR STRL (DRAPES) ×3 IMPLANT
LIQUID BAND (GAUZE/BANDAGES/DRESSINGS) ×3 IMPLANT
MANIFOLD NEPTUNE II (INSTRUMENTS) ×3 IMPLANT
NDL SAFETY ECLIPSE 18X1.5 (NEEDLE) ×1 IMPLANT
NEEDLE HYPO 18GX1.5 SHARP (NEEDLE) ×3
PACK TOTAL JOINT (CUSTOM PROCEDURE TRAY) ×3 IMPLANT
PEN SKIN MARKING BROAD (MISCELLANEOUS) ×3 IMPLANT
SUCTION FRAZIER 12FR DISP (SUCTIONS) ×3 IMPLANT
SUT MNCRL AB 4-0 PS2 18 (SUTURE) ×3 IMPLANT
SUT VIC AB 1 CT1 36 (SUTURE) ×3 IMPLANT
SUT VIC AB 2-0 CT1 27 (SUTURE) ×6
SUT VIC AB 2-0 CT1 TAPERPNT 27 (SUTURE) ×2 IMPLANT
SUT VLOC 180 0 24IN GS25 (SUTURE) ×3 IMPLANT
SYR 50ML LL SCALE MARK (SYRINGE) ×3 IMPLANT
TOWEL OR 17X26 10 PK STRL BLUE (TOWEL DISPOSABLE) ×3 IMPLANT
TOWEL OR NON WOVEN STRL DISP B (DISPOSABLE) ×2 IMPLANT
TRAY FOLEY W/METER SILVER 14FR (SET/KITS/TRAYS/PACK) IMPLANT
YANKAUER SUCT BULB TIP 10FT TU (MISCELLANEOUS) ×2 IMPLANT
YANKAUER SUCT BULB TIP NO VENT (SUCTIONS) ×1 IMPLANT

## 2014-07-02 NOTE — Anesthesia Postprocedure Evaluation (Signed)
  Anesthesia Post-op Note  Patient: Jenna Contreras  Procedure(s) Performed: Procedure(s) (LRB): RIGHT UNI KNEE ARTHOPLASTY MEDIALLY (Right)  Patient Location: PACU  Anesthesia Type: Spinal  Level of Consciousness: awake and alert   Airway and Oxygen Therapy: Patient Spontanous Breathing  Post-op Pain: mild  Post-op Assessment: Post-op Vital signs reviewed, Patient's Cardiovascular Status Stable, Respiratory Function Stable, Patent Airway and No signs of Nausea or vomiting  Last Vitals:  Filed Vitals:   07/02/14 0930  BP: 105/63  Pulse: 76  Temp:   Resp: 15    Post-op Vital Signs: stable   Complications: No apparent anesthesia complications

## 2014-07-02 NOTE — Interval H&P Note (Signed)
History and Physical Interval Note:  07/02/2014 6:39 AM  Jenna Contreras  has presented today for surgery, with the diagnosis of RIGHT MEDIAL COMPARTMENTAL ARTHRITIS  The various methods of treatment have been discussed with the patient and family. After consideration of risks, benefits and other options for treatment, the patient has consented to  Procedure(s): RIGHT UNI KNEE ARTHOPLASTY MEDIALLY (Right) as a surgical intervention .  The patient's history has been reviewed, patient examined, no change in status, stable for surgery.  I have reviewed the patient's chart and labs.  Questions were answered to the patient's satisfaction.     Mauri Pole

## 2014-07-02 NOTE — Evaluation (Signed)
Physical Therapy Evaluation Patient Details Name: Jenna Contreras MRN: [id number] DOB: 01-20-1946 Today's Date: 07/02/2014   History of Present Illness  s/p R UKR  Clinical Impression  Patient tolerated very well. Will need a platform  For her RW due to R hand arthritis, using a splint. patiebnt will benefit from PT to address problems listed in note below.    Follow Up Recommendations Home health PT;Supervision/Assistance - 24 hour    Equipment Recommendations   (R platform for patient's RW)    Recommendations for Other Services       Precautions / Restrictions Precautions Precautions: Knee Required Braces or Orthoses: Other Brace/Splint Other Brace/Splint: R hand, to have reconstructive surgery of  Thumb/carpals      Mobility  Bed Mobility Overal bed mobility: Needs Assistance Bed Mobility: Supine to Sit     Supine to sit: Supervision        Transfers Overall transfer level: Needs assistance Equipment used: Right platform walker Transfers: Sit to/from Stand Sit to Stand: Min assist         General transfer comment: cues for safety, technique  Ambulation/Gait Ambulation/Gait assistance: Min assist Ambulation Distance (Feet): 180 Feet Assistive device: Right platform walker Gait Pattern/deviations: Step-through pattern     General Gait Details: cues for use of platform  Stairs            Wheelchair Mobility    Modified Rankin (Stroke Patients Only)       Balance                                             Pertinent Vitals/Pain Pain Assessment: 0-10 Pain Score: 2  Pain Location: L knee, R hand  in splint, back pain    Home Living Family/patient expects to be discharged to:: Private residence Living Arrangements: Spouse/significant other;Alone;Non-relatives/Friends Available Help at Discharge: Family;Friend(s) Type of Home: House Home Access: Stairs to enter   [employer] of Steps: 1 Home  Layout: One level;Able to live on main level with bedroom/bathroom Home Equipment: Gilford Rile - 2 wheels;Bedside commode;Cane - single point;Adaptive equipment Additional Comments: pt states spouse lives in separate home and checks on her daily to see if she needs anything but she lives alone.    Prior Function Level of Independence: Independent               Hand Dominance        Extremity/Trunk Assessment               Lower Extremity Assessment: RLE deficits/detail RLE Deficits / Details: + SLR, knee flexion 60 degrees.       Communication   Communication: No difficulties  Cognition Arousal/Alertness: Awake/alert Behavior During Therapy: WFL for tasks assessed/performed Overall Cognitive Status: Within Functional Limits for tasks assessed                      General Comments      Exercises        Assessment/Plan    PT Assessment Patient needs continued PT services  PT Diagnosis Difficulty walking   PT Problem List Decreased strength;Decreased range of motion;Decreased activity tolerance;Decreased mobility;Pain;Decreased knowledge of precautions;Decreased knowledge of use of DME  PT Treatment Interventions DME instruction;Gait training;Stair training;Functional mobility training;Therapeutic activities;Therapeutic exercise;Patient/family education   PT Goals (Current goals can be found in the Care Plan section) Acute Rehab PT Goals  Patient Stated Goal: to get this over, not be in pain PT Goal Formulation: With patient Time For Goal Achievement: 07/09/14 Potential to Achieve Goals: Good    Frequency 7X/week   Barriers to discharge        Co-evaluation               End of Session   Activity Tolerance: Patient tolerated treatment well Patient left: in chair;with call bell/phone within reach Nurse Communication: Mobility status    Functional Assessment Tool Used: clinical judgement Functional Limitation: Mobility: Walking and moving  around Mobility: Walking and Moving Around Current Status (L9747): At least 20 percent but less than 40 percent impaired, limited or restricted Mobility: Walking and Moving Around Goal Status [phone]): At least 1 percent but less than 20 percent impaired, limited or restricted    Time: 1586-8257 PT Time Calculation (min) (ACUTE ONLY): 33 min   Charges:   PT Evaluation $Initial PT Evaluation Tier I: 1 Procedure PT Treatments $Gait Training: 8-22 mins   PT G Codes:   PT G-Codes **NOT FOR INPATIENT CLASS** Functional Assessment Tool Used: clinical judgement Functional Limitation: Mobility: Walking and moving around Mobility: Walking and Moving Around Current Status (K9355): At least 20 percent but less than 40 percent impaired, limited or restricted Mobility: Walking and Moving Around Goal Status [phone]): At least 1 percent but less than 20 percent impaired, limited or restricted    [name] 07/02/2014, 6:50 PM Tresa Endo PT [phone]

## 2014-07-02 NOTE — Anesthesia Procedure Notes (Signed)
Epidural Patient location during procedure: holding area  Staffing Anesthesiologist: Montez Hageman Performed by: anesthesiologist   Preanesthetic Checklist Completed: patient identified, site marked, surgical consent, pre-op evaluation, timeout performed, IV checked, risks and benefits discussed, monitors and equipment checked and post-op pain management  Epidural Patient position: sitting Prep: Betadine Patient monitoring: heart rate, continuous pulse ox and blood pressure Approach: left paramedian Location: L3-L4  Needle:  Needle type: Hustead  Needle gauge: 18 G Needle length: 9 cm and 9 Catheter type: closed end flexible Catheter size: 20 Guage Test dose: negative and 1.5% lidocaine  Additional Notes Test dose 1.5% Lidocaine with epi 1:200,000  Patient tolerated the insertion well without complications.Reason for block:post-op pain management

## 2014-07-02 NOTE — Op Note (Signed)
NAME: Jenna Contreras    MEDICAL RECORD NO.: [id number]   FACILITY: [facility] OF BIRTH: [birth year]  PHYSICIAN: Pietro Cassis. Alvan Dame, M.D.    DATE OF PROCEDURE: 07/02/2014    OPERATIVE REPORT   PREOPERATIVE DIAGNOSIS: Right knee medial compartment osteoarthritis.   POSTOPERATIVE DIAGNOSIS: Right knee medial compartment osteoarthritis.  PROCEDURE: Right partial knee replacement utilizing Biomet Oxford knee  component, size X-small femur, a right medial size AA tibial tray with a size 5 mm insert.   SURGEON: Pietro Cassis. Alvan Dame, M.D.   ASSISTANT: Danae Orleans, PAC.  Please note that Mr. Guinevere Scarlet was present for the entirety of the case,  utilized for preoperative positioning, perioperative retractor  management, general facilitation of the case and primary wound closure.   ANESTHESIA: Spinal.   SPECIMENS: None.   COMPLICATIONS: None.  DRAINS: None   TOURNIQUET TIME: 36 minutes at 250 mmHg.   INDICATIONS FOR PROCEDURE: The patient is a 69 y.o. patient of mine who presented for evaluation of right knee pain after having already undergoing a left partial knee arthroplasty.  She had presented with primary complaints of pain on the medial side of both of her knees. Radiographs revealed advanced medial compartment arthritis with specifically an antero-medial wear pattern.  There was bone on bone changes noted with subchondral sclerosis and osteophytes present. The patient has had progressive problems failing to respond to conservative measures of medications, injections and activity modification. Risks of infection, DVT, component failure, need for future revision surgery were all discussed and reviewed.  Consent was obtained for benefit of pain relief.   PROCEDURE IN DETAIL: The patient was brought to the operative theater.  Once adequate anesthesia, preoperative antibiotics, 900mg  of Cleocin administered, the patient was positioned in supine position with a right thigh tourniquet   placed. The right lower extremity was prepped and draped in sterile  fashion with the leg on the Oxford leg holder.  The leg was allowed to flex to 120 degrees. A time-out  was performed identifying the patient, planned procedure, and extremity.  The leg was exsanguinated, tourniquet elevated to 250 mmHg. A midline  incision was made from the proximal pole of the patella to the tibial tubercle. A  soft tissue plane was created and partial median arthrotomy was then  made to allow for subluxation of the patella. Following initial synovectomy and  debridement, the osteophytes were removed off the medial aspect of the  knee.   Attention was first directed to the tibia. The tibial  extramedullary guide was positioned over the anterior crest of the tibia  and pinned into position and attached to the size X-small femoral sizing spoon.  Using a measured resection guide from the  [place] system, a 4 mm resection was made off the proximal tibia. First  the reciprocating saw along the medial aspect of the tibial spines, then the oscillating saw.    At this point, I sized this cut surface seem to be best fit for a size AA right medial tibial tray.  With the retractors out of the wound and the knee held at 90 degrees the 4 feeler gauge had appropriate tension on the medial ligament.   At this point, the femoral canal was opened with a drill and the  intramedullary rod passed. Then using the guide for a X-small posterior resection off  the posterior aspect of the femur was positioned over the mid portion of the medial femoral condyle.  The orientation was set using the guide that mates  the femoral guide to the intramedullary rod.  The 2 drill holes were made into the distal femur.  The posterior guide was then impacted into place and the posterior  femoral cut made.  At this point, I milled the distal femur with a size 4 spigot in place. At this point, we did a trial reduction of the X-small femur, size  right medial AA tibial tray and first the 4 then the 5 feeler gauge. At 90 degrees of  flexion and at 20 degrees of flexion the knee had symmetric tension on  the ligaments.   Given these findings, the trial femoral component was removed. Final preparation of tibia was carried out by pinning it in position. Then  using a reciprocating saw I removed bone for the keel. Further bone was  removed with an osteotome.  Trial reduction was now carried out with the X-small femur, the right medial keeled tibia, and the size 5 lollipop insert. The balance of the  ligaments appeared to be symmetric at 20 degrees and 90 degrees. Given  all these findings, the trial components were removed.   Cement was mixed. The final components were opened. I injected the synovial/capsular junction with a Marcaine, Toradol, and saline combination.  The knee was irrigated with  normal saline solution. Then final debridements of the  soft tissue was carried out, I also drilled the sclerotic bone with a drill.  The final components were cemented with a single batch of cement in a  two-stage technique with the tibial component cemented first. The knee  was then brought  to 45 degrees of flexion with a 5 feeler gauge, held with pressure for a minute and half.  After this the femoral component was cemented in place.  The knee was again held at 45 degrees of flexion while the cement fully cured.  Excess cement was removed throughout the knee. Tourniquet was let down  after 36 minutes. After the cement had fully cured and excessive cement  was removed throughout the knee there was no visualized cement present.   The final size 5 X-small right medial insert was chosen and snapped into position. We re-irrigated  the knee. The extensor mechanism  was then reapproximated using a #1 Vicryl and #0 V-lock sutures with the knee in flexion. The  remaining wound was closed with 2-0 Vicryl and a running 4-0 Monocryl.  The knee was  cleaned, dried, and dressed sterilely using Dermabond and  Aquacel dressing. The patient  was brought to the recovery room, Ace wrap in place, tolerating the  procedure well. She will be in the hospital for overnight observation.  We will initiate physical therapy and progress to ambulate.     Pietro Cassis Alvan Dame, M.D.

## 2014-07-02 NOTE — Anesthesia Preprocedure Evaluation (Addendum)
Anesthesia Evaluation  Patient identified by MRN, date of birth, ID band Patient awake    Reviewed: Allergy & Precautions, H&P , NPO status , Patient's Chart, lab work & pertinent test results  History of Anesthesia Complications Negative for: history of anesthetic complications  Airway Mallampati: II  TM Distance: >3 FB Neck ROM: Full    Dental  (+) Dental Advisory Given, Chipped, Poor Dentition   Pulmonary shortness of breath and with exertion, asthma , former smoker,  breath sounds clear to auscultation  Pulmonary exam normal       Cardiovascular negative cardio ROS  Rhythm:Regular Rate:Normal     Neuro/Psych  Headaches, PSYCHIATRIC DISORDERS Anxiety Depression Bipolar Disorder TIAnegative psych ROS   GI/Hepatic Neg liver ROS, GERD-  Medicated and Controlled,  Endo/Other  Hypothyroidism   Renal/GU negative Renal ROS  negative genitourinary   Musculoskeletal  (+) Arthritis -, Osteoarthritis,  Fibromyalgia -, narcotic dependentscoliosis   Abdominal   Peds negative pediatric ROS (+)  Hematology  (+) anemia ,   Anesthesia Other Findings   Reproductive/Obstetrics negative OB ROS                            Anesthesia Physical  Anesthesia Plan  ASA: II  Anesthesia Plan: Spinal   Post-op Pain Management:    Induction: Intravenous  Airway Management Planned: Simple Face Mask  Additional Equipment:   Intra-op Plan:   Post-operative Plan:   Informed Consent: I have reviewed the patients History and Physical, chart, labs and discussed the procedure including the risks, benefits and alternatives for the proposed anesthesia with the patient or authorized representative who has indicated his/her understanding and acceptance.   Dental advisory given  Plan Discussed with: CRNA  Anesthesia Plan Comments:        Anesthesia Quick Evaluation

## 2014-07-02 NOTE — Transfer of Care (Signed)
Immediate Anesthesia Transfer of Care Note  Patient: Jenna Contreras  Procedure(s) Performed: Procedure(s): RIGHT UNI KNEE ARTHOPLASTY MEDIALLY (Right)  Patient Location: PACU  Anesthesia Type:Regional and Spinal  Level of Consciousness: awake, sedated and patient cooperative  Airway & Oxygen Therapy: Patient Spontanous Breathing and Patient connected to face mask oxygen  Post-op Assessment: Report given to RN and Post -op Vital signs reviewed and stable  Post vital signs: Reviewed and stable  Last Vitals:  Filed Vitals:   07/02/14 0538  BP: 104/59  Pulse: 86  Temp: 36.7 C  Resp: 16    Complications: No apparent anesthesia complications

## 2014-07-03 DIAGNOSIS — F419 Anxiety disorder, unspecified: Secondary | ICD-10-CM | POA: Diagnosis not present

## 2014-07-03 DIAGNOSIS — M1711 Unilateral primary osteoarthritis, right knee: Secondary | ICD-10-CM | POA: Diagnosis not present

## 2014-07-03 DIAGNOSIS — K219 Gastro-esophageal reflux disease without esophagitis: Secondary | ICD-10-CM | POA: Diagnosis not present

## 2014-07-03 DIAGNOSIS — J45909 Unspecified asthma, uncomplicated: Secondary | ICD-10-CM | POA: Diagnosis not present

## 2014-07-03 DIAGNOSIS — Z87891 Personal history of nicotine dependence: Secondary | ICD-10-CM | POA: Diagnosis not present

## 2014-07-03 DIAGNOSIS — M25561 Pain in right knee: Secondary | ICD-10-CM | POA: Diagnosis not present

## 2014-07-03 LAB — BASIC METABOLIC PANEL
ANION GAP: 4 — AB (ref 5–15)
BUN: 8 mg/dL (ref 6–23)
CALCIUM: 8.4 mg/dL (ref 8.4–10.5)
CO2: 26 mmol/L (ref 19–32)
CREATININE: 0.63 mg/dL (ref 0.50–1.10)
Chloride: 105 mmol/L (ref 96–112)
GFR calc Af Amer: >90 mL/min (ref >90)
GFR calc non Af Amer: 89 mL/min — ABNORMAL LOW (ref >90)
Glucose, Bld: 93 mg/dL (ref 70–99)
POTASSIUM: 3.8 mmol/L (ref 3.5–5.1)
SODIUM: 135 mmol/L (ref 135–145)

## 2014-07-03 LAB — CBC
HCT: 35.7 % — ABNORMAL LOW (ref 36.0–46.0)
HEMOGLOBIN: 11.7 g/dL — AB (ref 12.0–15.0)
MCH: 28.4 pg (ref 26.0–34.0)
MCHC: 32.8 g/dL (ref 30.0–36.0)
MCV: 86.7 fL (ref 78.0–100.0)
Platelets: 246 10*3/uL (ref 150–400)
RBC: 4.12 MIL/uL (ref 3.87–5.11)
RDW: 14.4 % (ref 11.5–15.5)
WBC: 8.2 10*3/uL (ref 4.0–10.5)

## 2014-07-03 MED ORDER — FERROUS SULFATE 325 (65 FE) MG PO TABS: 325.0000 mg | ORAL_TABLET | Freq: Three times a day (TID) | ORAL | Status: DC

## 2014-07-03 MED ORDER — OXYCODONE HCL 5 MG PO TABS: 5.0000 mg | ORAL_TABLET | ORAL | Status: DC | PRN

## 2014-07-03 MED ORDER — CYCLOBENZAPRINE HCL 10 MG PO TABS: 10.0000 mg | ORAL_TABLET | Freq: Three times a day (TID) | ORAL | Status: DC | PRN

## 2014-07-03 MED ORDER — RIVAROXABAN 10 MG PO TABS: 10.0000 mg | ORAL_TABLET | Freq: Every day | ORAL | Status: AC

## 2014-07-03 MED ORDER — METHOCARBAMOL 500 MG PO TABS: 500.0000 mg | ORAL_TABLET | Freq: Four times a day (QID) | ORAL | Status: DC | PRN

## 2014-07-03 MED ORDER — POLYETHYLENE GLYCOL 3350 17 G PO PACK: 17.0000 g | PACK | Freq: Two times a day (BID) | ORAL | Status: DC

## 2014-07-03 MED ORDER — ASPIRIN EC 325 MG PO TBEC: 325.0000 mg | DELAYED_RELEASE_TABLET | Freq: Two times a day (BID) | ORAL | Status: AC

## 2014-07-03 NOTE — Care Management Note (Addendum)
    Page 1 of 2   07/03/2014     11:01:27 AM CARE MANAGEMENT NOTE 07/03/2014  Patient:  Jenna Contreras, Jenna Contreras   Account Number:  [id number]  Date Initiated:  07/03/2014  Documentation initiated by:  [facility]  Subjective/Objective Assessment:   adm: OBSERVATION: RIGHT UNI KNEE ARTHOPLASTY MEDIALLY (Right)     Action/Plan:   DISCHARGE PLANNING   Anticipated DC Date:  07/03/2014   Anticipated DC Plan:  [place]  CM consult      [facility] Choice  HOME HEALTH   Choice offered to / List presented to:  C-1 Patient   DME arranged  [place]      DME agency  [place] arranged  [place]   Status of service:  Completed, signed off Medicare Important Message given?   (If response is "NO", the following Medicare IM given date fields will be blank) Date Medicare IM given:   Medicare IM given by:   Date Additional Medicare IM given:   Additional Medicare IM given by:    Discharge Disposition:  [place]  Per UR Regulation:    If discussed at Long Length of Stay Meetings, dates discussed:    Comments:  07/03/14 08:45 CM met with pt in room to offer choice of home health agency.  Pt chooses [facility] to render HHPT/OT. Cm called [facility] DME rep, Kristen to please deliver a Right Platform for pt's walker.  Pt has a 3n1 at home. Referral emailed to [employer], Tim.  No other CM needs were communicated.  Mariane Masters, BSN, CM [phone].

## 2014-07-03 NOTE — Progress Notes (Signed)
Patient ID: Jenna Contreras, female   DOB: 1945-10-15, 69 y.o.   MRN: [id number] Subjective: 1 Day Post-Op Procedure(s) (LRB): RIGHT UNI KNEE ARTHOPLASTY MEDIALLY (Right)    Patient reports pain as mild.  Seems to be doing well.  Walked hall yesterday X2  Objective:   VITALS:   Filed Vitals:   07/03/14 0523  BP: 107/64  Pulse: 84  Temp: 99.4 F (37.4 C)  Resp: 16    Neurovascular intact Incision: dressing C/D/I  LABS  Recent Labs  07/03/14 0546  HGB 11.7*  HCT 35.7*  WBC 8.2  PLT 246     Recent Labs  07/03/14 0546  NA 135  K 3.8  BUN 8  CREATININE 0.63  GLUCOSE 93    No results for input(s): LABPT, INR in the last 72 hours.   Assessment/Plan: 1 Day Post-Op Procedure(s) (LRB): RIGHT UNI KNEE ARTHOPLASTY MEDIALLY (Right)   Advance diet Up with therapy Discharge home with home health today after am PT  RTC in 2 weeks Will follow up with regards to outpatient PT and her left knee which has aggravated her recently

## 2014-07-03 NOTE — Progress Notes (Signed)
Physical Therapy Treatment Patient Details Name: Jenna Contreras MRN: [id number] DOB: [birth year] Today's Date: 07/03/2014    History of Present Illness s/p R UKR    PT Comments    Patient  Tolerated  mobility well. Practiced Step with platform RW. .  Follow Up Recommendations  Home health PT;Supervision/Assistance - 24 hour     Equipment Recommendations   (platform)    Recommendations for Other Services       Precautions / Restrictions Precautions Precautions: Knee Required Braces or Orthoses: Other Brace/Splint Other Brace/Splint: R hand, to have reconstructive surgery of  Thumb/carpals    Mobility  Bed Mobility   Bed Mobility: Supine to Sit     Supine to sit: Supervision        Transfers   Equipment used: Right platform walker Transfers: Sit to/from Stand Sit to Stand: Supervision         General transfer comment: cues for safety, technique  Ambulation/Gait Ambulation/Gait assistance: Supervision Ambulation Distance (Feet): 50 Feet Assistive device: Right platform walker Gait Pattern/deviations: Step-through pattern;Antalgic;Step-to pattern;Decreased step length - left     General Gait Details: cues for use of platform   Stairs Stairs: Yes Stairs assistance: Min assist Stair Management: Step to pattern;Backwards;With walker Number of Stairs: 1 General stair comments: instructed spouse in how to guard and trechnique after patient had practiced.  Wheelchair Mobility    Modified Rankin (Stroke Patients Only)       Balance                                    Cognition   Behavior During Therapy: Anxious                        Exercises Total Joint Exercises Quad Sets: AROM;Right;10 reps Heel Slides: AAROM;Right;10 reps Hip ABduction/ADduction: AAROM;Right;10 reps Straight Leg Raises: AAROM;Right;10 reps Goniometric ROM: 10-60 R knee    General Comments        Pertinent Vitals/Pain Pain Score: 6   Pain Location: R hip, groin and  R knee    Home Living                      Prior Function            PT Goals (current goals can now be found in the care plan section) Progress towards PT goals: Progressing toward goals    Frequency       PT Plan Current plan remains appropriate    Co-evaluation             End of Session   Activity Tolerance: Patient tolerated treatment well Patient left: in bed;with call bell/phone within reach     Time: 1322-1403 PT Time Calculation (min) (ACUTE ONLY): 41 min  Charges:  $Gait Training: 8-22 mins $Therapeutic Exercise: 8-22 mins $Self Care/Home Management: 8-22                    G Codes:  Mobility: Walking and Moving Around Discharge Status [phone]): At least 1 percent but less than 20 percent impaired, limited or restricted   [name] 07/03/2014, 5:06 PM

## 2014-07-03 NOTE — Discharge Instructions (Signed)
INSTRUCTIONS AFTER JOINT REPLACEMENT  ° °o Remove items at home which could result in a fall. This includes throw rugs or furniture in walking pathways °o ICE to the affected joint every three hours while awake for 30 minutes at a time, for at least the first 3-5 days, and then as needed for pain and swelling.  Continue to use ice for pain and swelling. You may notice swelling that will progress down to the foot and ankle.  This is normal after surgery.  Elevate your leg when you are not up walking on it.   °o Continue to use the breathing machine you got in the hospital (incentive spirometer) which will help keep your temperature down.  It is common for your temperature to cycle up and down following surgery, especially at night when you are not up moving around and exerting yourself.  The breathing machine keeps your lungs expanded and your temperature down. ° ° °DIET:  As you were doing prior to hospitalization, we recommend a well-balanced diet. ° °DRESSING / WOUND CARE / SHOWERING ° °Keep the surgical dressing until follow up.  The dressing is water proof, so you can shower without any extra covering.  IF THE DRESSING FALLS OFF or the wound gets wet inside, change the dressing with sterile gauze.  Please use good hand washing techniques before changing the dressing.  Do not use any lotions or creams on the incision until instructed by your surgeon.   ° °ACTIVITY ° °o Increase activity slowly as tolerated, but follow the weight bearing instructions below.   °o No driving for 6 weeks or until further direction given by your physician.  You cannot drive while taking narcotics.  °o No lifting or carrying greater than 10 lbs. until further directed by your surgeon. °o Avoid periods of inactivity such as sitting longer than an hour when not asleep. This helps prevent blood clots.  °o You may return to work once you are authorized by your doctor.  ° ° ° °WEIGHT BEARING  ° °Weight bearing as tolerated with assist  device (walker, cane, etc) as directed, use it as long as suggested by your surgeon or therapist, typically at least 4-6 weeks. ° ° °EXERCISES ° °Results after joint replacement surgery are often greatly improved when you follow the exercise, range of motion and muscle strengthening exercises prescribed by your doctor. Safety measures are also important to protect the joint from further injury. Any time any of these exercises cause you to have increased pain or swelling, decrease what you are doing until you are comfortable again and then slowly increase them. If you have problems or questions, call your caregiver or physical therapist for advice.  ° °Rehabilitation is important following a joint replacement. After just a few days of immobilization, the muscles of the leg can become weakened and shrink (atrophy).  These exercises are designed to build up the tone and strength of the thigh and leg muscles and to improve motion. Often times heat used for twenty to thirty minutes before working out will loosen up your tissues and help with improving the range of motion but do not use heat for the first two weeks following surgery (sometimes heat can increase post-operative swelling).  ° °These exercises can be done on a training (exercise) mat, on the floor, on a table or on a bed. Use whatever works the best and is most comfortable for you.    Use music or television while you are exercising so that   the exercises are a pleasant break in your day. This will make your life better with the exercises acting as a break in your routine that you can look forward to.   Perform all exercises about fifteen times, three times per day or as directed.  You should exercise both the operative leg and the other leg as well. ° °Exercises include: °  °• Quad Sets - Tighten up the muscle on the front of the thigh (Quad) and hold for 5-10 seconds.   °• Straight Leg Raises - With your knee straight (if you were given a brace, keep it on),  lift the leg to 60 degrees, hold for 3 seconds, and slowly lower the leg.  Perform this exercise against resistance later as your leg gets stronger.  °• Leg Slides: Lying on your back, slowly slide your foot toward your buttocks, bending your knee up off the floor (only go as far as is comfortable). Then slowly slide your foot back down until your leg is flat on the floor again.  °• Angel Wings: Lying on your back spread your legs to the side as far apart as you can without causing discomfort.  °• Hamstring Strength:  Lying on your back, push your heel against the floor with your leg straight by tightening up the muscles of your buttocks.  Repeat, but this time bend your knee to a comfortable angle, and push your heel against the floor.  You may put a pillow under the heel to make it more comfortable if necessary.  ° °A rehabilitation program following joint replacement surgery can speed recovery and prevent re-injury in the future due to weakened muscles. Contact your doctor or a physical therapist for more information on knee rehabilitation.  ° ° °CONSTIPATION ° °Constipation is defined medically as fewer than three stools per week and severe constipation as less than one stool per week.  Even if you have a regular bowel pattern at home, your normal regimen is likely to be disrupted due to multiple reasons following surgery.  Combination of anesthesia, postoperative narcotics, change in appetite and fluid intake all can affect your bowels.  ° °YOU MUST use at least one of the following options; they are listed in order of increasing strength to get the job done.  They are all available over the counter, and you may need to use some, POSSIBLY even all of these options:   ° °Drink plenty of fluids (prune juice may be helpful) and high fiber foods °Colace 100 mg by mouth twice a day  °Senokot for constipation as directed and as needed Dulcolax (bisacodyl), take with full glass of water  °Miralax (polyethylene glycol)  once or twice a day as needed. ° °If you have tried all these things and are unable to have a bowel movement in the first 3-4 days after surgery call either your surgeon or your primary doctor.   ° °If you experience loose stools or diarrhea, hold the medications until you stool forms back up.  If your symptoms do not get better within 1 week or if they get worse, check with your doctor.  If you experience "the worst abdominal pain ever" or develop nausea or vomiting, please contact the office immediately for further recommendations for treatment. ° ° °ITCHING:  If you experience itching with your medications, try taking only a single pain pill, or even half a pain pill at a time.  You can also use Benadryl over the counter for itching or also to   help with sleep.   TED HOSE STOCKINGS:  Use stockings on both legs until for at least 2 weeks or as directed by physician office. They may be removed at night for sleeping.  MEDICATIONS:  See your medication summary on the After Visit Summary that nursing will review with you.  You may have some home medications which will be placed on hold until you complete the course of blood thinner medication.  It is important for you to complete the blood thinner medication as prescribed.  PRECAUTIONS:  If you experience chest pain or shortness of breath - call 911 immediately for transfer to the hospital emergency department.   If you develop a fever greater that 101 F, purulent drainage from wound, increased redness or drainage from wound, foul odor from the wound/dressing, or calf pain - CONTACT YOUR SURGEON.                                                   FOLLOW-UP APPOINTMENTS:  If you do not already have a post-op appointment, please call the office for an appointment to be seen by your surgeon.  Guidelines for how soon to be seen are listed in your After Visit Summary, but are typically between 1-4 weeks after surgery.  OTHER INSTRUCTIONS:   Knee  Replacement:  Do not place pillow under knee, focus on keeping the knee straight while resting.   MAKE SURE YOU:   Understand these instructions.   Get help right away if you are not doing well or get worse.    Thank you for letting us be a part of your medical care team.  It is a privilege we respect greatly.  We hope these instructions will help you stay on track for a fast and full recovery!   Information on my medicine - XARELTO (Rivaroxaban)  This medication education was reviewed with me or my healthcare representative as part of my discharge preparation.  The pharmacist that spoke with me during my hospital stay was:  Minda Ditto, [facility]  Why was Xarelto prescribed for you? Xarelto was prescribed for you to reduce the risk of blood clots forming after orthopedic surgery. The medical term for these abnormal blood clots is venous thromboembolism (VTE).  What do you need to know about xarelto ? Take your Xarelto ONCE DAILY at the same time every day. You may take it either with or without food.  If you have difficulty swallowing the tablet whole, you may crush it and mix in applesauce just prior to taking your dose.  Take Xarelto exactly as prescribed by your doctor and DO NOT stop taking Xarelto without talking to the doctor who prescribed the medication.  Stopping without other VTE prevention medication to take the place of Xarelto may increase your risk of developing a clot.  After discharge, you should have regular check-up appointments with your healthcare provider that is prescribing your Xarelto.    What do you do if you miss a dose? If you miss a dose, take it as soon as you remember on the same day then continue your regularly scheduled once daily regimen the next day. Do not take two doses of Xarelto on the same day.   Important Safety Information A possible side effect of Xarelto is bleeding. You should call your healthcare provider right away if you  experience any of the following: ? Bleeding from an injury or your nose that does not stop. ? Unusual colored urine (red or dark brown) or unusual colored stools (red or black). ? Unusual bruising for unknown reasons. ? A serious fall or if you hit your head (even if there is no bleeding).  Some medicines may interact with Xarelto and might increase your risk of bleeding while on Xarelto. To help avoid this, consult your healthcare provider or pharmacist prior to using any new prescription or non-prescription medications, including herbals, vitamins, non-steroidal anti-inflammatory drugs (NSAIDs) and supplements.  We discussed Celebrex which could increase risk of bleed. Dr Alvan Dame continues this medication in the hospital for added pain relief. This med has less risk of bleed than Ibuprofen or Aspirin.  This website has more information on Xarelto: [email or web address]

## 2014-07-03 NOTE — Evaluation (Signed)
Occupational Therapy Evaluation Patient Details Name: Jenna Contreras MRN: [id number] DOB: 17-Jul-1945 Today's Date: 07/03/2014    History of Present Illness s/p R UKR   Clinical Impression   This 69 year old female was admitted for the above surgery. She has a h/o L hip and knee sx and needs sx on R hand, per pt.  She was mod I with adls prior to admission.  Pt will benefit from skilled OT to increase safety and independence with adls and bathroom transfers. She currently needs min A and goals are for min guard.      Follow Up Recommendations  Home health OT ; supervision 24/7   Equipment Recommendations  None recommended by OT    Recommendations for Other Services       Precautions / Restrictions Precautions Precautions: Knee Required Braces or Orthoses: Other Brace/Splint Other Brace/Splint: R hand, to have reconstructive surgery of  Thumb/carpals Restrictions Weight Bearing Restrictions: No      Mobility Bed Mobility                  Transfers   Equipment used: Right platform walker Transfers: Sit to/from Stand Sit to Stand: Min assist         General transfer comment: cues for safety, technique    Balance                                            ADL Overall ADL's : Needs assistance/impaired     Grooming: Set up;Sitting   Upper Body Bathing: Set up;Sitting   Lower Body Bathing: Minimal assistance;With adaptive equipment;Sit to/from stand   Upper Body Dressing : Set up;Sitting   Lower Body Dressing: Minimal assistance;Sit to/from stand;With adaptive equipment   Toilet Transfer: Minimal assistance;Ambulation;BSC;RW   Toileting- Clothing Manipulation and Hygiene: Supervision/safety;Sitting/lateral lean         General ADL Comments: Pt ambulated to bathroom.  Unsteady with sit to stand and ambulating, requiring assist to stabilize.  Will need 24/7.  Pt has AE at home:  reviewed and used AE including reacher and  sock aide.       Vision     Perception     Praxis      Pertinent Vitals/Pain Pain Score: 5  Pain Location: L hip and knee (premedicated, repositioned and reapplied ice)     Hand Dominance Right   Extremity/Trunk Assessment Upper Extremity Assessment Upper Extremity Assessment: Generalized weakness (R hand needs sx:  wearing neoprene thumb support brace)           Communication Communication Communication: No difficulties   Cognition Arousal/Alertness: Awake/alert Behavior During Therapy: WFL for tasks assessed/performed Overall Cognitive Status: Within Functional Limits for tasks assessed                     General Comments       Exercises       Shoulder Instructions      Home Living Family/patient expects to be discharged to:: Private residence Living Arrangements: Spouse/significant other;Alone;Non-relatives/Friends Available Help at Discharge: Family;Friend(s) Type of Home: House             Bathroom Shower/Tub: Walk-in Corporate treasurer Toilet: Standard     Home Equipment: Environmental consultant - 2 wheels;Bedside commode;Cane - single point;Adaptive equipment          Prior Functioning/Environment Level of Independence: Independent  OT Diagnosis: Acute pain;Generalized weakness   OT Problem List: Decreased strength;Decreased activity tolerance;Impaired balance (sitting and/or standing);Decreased knowledge of use of DME or AE;Pain   OT Treatment/Interventions: Self-care/ADL training;DME and/or AE instruction;Patient/family education;Balance training    OT Goals(Current goals can be found in the care plan section) Acute Rehab OT Goals Patient Stated Goal: to get this over, not be in pain OT Goal Formulation: With patient Time For Goal Achievement: 07/10/14 Potential to Achieve Goals: Good ADL Goals Pt Will Perform Lower Body Bathing: with min guard assist;sit to/from stand;with adaptive equipment Pt Will Perform Lower Body  Dressing: with min guard assist;with adaptive equipment;sit to/from stand Pt Will Transfer to Toilet: with min guard assist;bedside commode;ambulating Pt Will Perform Toileting - Clothing Manipulation and hygiene: with min guard assist;sit to/from stand  OT Frequency: Min 2X/week   Barriers to D/C:            Co-evaluation              End of Session    Activity Tolerance: Patient tolerated treatment well;No increased pain Patient left: in chair;with call bell/phone within reach   Time: 0903-0931 OT Time Calculation (min): 28 min Charges:  OT General Charges $OT Visit: 1 Procedure OT Evaluation $Initial OT Evaluation Tier I: 1 Procedure OT Treatments $Self Care/Home Management : 8-22 mins G-Codes: OT G-codes **NOT FOR INPATIENT CLASS** Functional Assessment Tool Used: clinical observation and judgment Functional Limitation: Self care Self Care Current Status (J6734): At least 20 percent but less than 40 percent impaired, limited or restricted Self Care Goal Status (L9379): At least 1 percent but less than 20 percent impaired, limited or restricted  [facility] 07/03/2014, 10:27 AM  Lesle Chris, OTR/L [phone] 07/03/2014

## 2014-07-03 NOTE — Progress Notes (Signed)
UR completed 

## 2014-07-04 ENCOUNTER — Telehealth: Payer: Self-pay

## 2014-07-04 NOTE — Telephone Encounter (Signed)
Left message with Glendell Docker (husband) to have patient return my call

## 2014-07-04 NOTE — Telephone Encounter (Signed)
Per Dr. Nicki Reaper (Bone Density Test results)- Significant severe osteopenia noted with increased risk of spontaneous fracture. Pt should consider starting Fosamax 70, 1 qweek,(taken in the am on a empty stomach,upright,with 8 oz of water obviously if side effects then we may need to try something different) If pt consents then #4, 1 qweek as described with 6 refills

## 2014-07-05 DIAGNOSIS — Z471 Aftercare following joint replacement surgery: Secondary | ICD-10-CM | POA: Diagnosis not present

## 2014-07-05 DIAGNOSIS — G8929 Other chronic pain: Secondary | ICD-10-CM | POA: Diagnosis not present

## 2014-07-05 DIAGNOSIS — M1990 Unspecified osteoarthritis, unspecified site: Secondary | ICD-10-CM | POA: Diagnosis not present

## 2014-07-05 DIAGNOSIS — M797 Fibromyalgia: Secondary | ICD-10-CM | POA: Diagnosis not present

## 2014-07-05 DIAGNOSIS — J4520 Mild intermittent asthma, uncomplicated: Secondary | ICD-10-CM | POA: Diagnosis not present

## 2014-07-05 DIAGNOSIS — F329 Major depressive disorder, single episode, unspecified: Secondary | ICD-10-CM | POA: Diagnosis not present

## 2014-07-06 ENCOUNTER — Telehealth: Payer: Self-pay | Admitting: *Deleted

## 2014-07-06 ENCOUNTER — Other Ambulatory Visit: Payer: Self-pay | Admitting: *Deleted

## 2014-07-06 MED ORDER — LAMOTRIGINE 100 MG PO TABS: 100.0000 mg | ORAL_TABLET | Freq: Every day | ORAL | Status: DC

## 2014-07-06 MED ORDER — ALENDRONATE SODIUM 70 MG PO TABS: 70.0000 mg | ORAL_TABLET | ORAL | Status: DC

## 2014-07-06 NOTE — Discharge Summary (Signed)
Physician Discharge Summary  Patient ID: Jenna Contreras MRN: [id number] DOB/AGE: [birth year] 69 y.o.  Admit date: 07/02/2014 Discharge date: 07/03/2014   Procedures:  Procedure(s) (LRB): RIGHT UNI KNEE ARTHOPLASTY MEDIALLY (Right)  Attending Physician:  Dr. Paralee Cancel   Admission Diagnoses:   Right knee medial compartmental primary OA /pain  Discharge Diagnoses:  Principal Problem:   S/P right UKR Active Problems:   Status post unilateral knee replacement  Past Medical History  Diagnosis Date  . Scoliosis   . Migraines   . Arthritis   . Hypothyroidism   . Fibromyalgia   . Depression   . OCD (obsessive compulsive disorder)   . Arthritis   . Spinal pain   . Osteoporosis   . Psoriasis   . History of skin cancer   . Constipation   . GERD (gastroesophageal reflux disease)   . Anxiety   . Bipolar 2 disorder   . Low BP   . History of gout   . HNP (herniated nucleus pulposus with myelopathy), thoracic   . Cancer     HX SKIN CANCER  . Asthma     NO CURRENT PROBLEMS  . Heart murmur     hx of as small child   . Left eye injury     In ED 06/17/2014     HPI:    Jenna Contreras, 69 y.o. female female, has a history of pain and functional disability in the right and has failed non-surgical conservative treatments for greater than 12 weeks to include NSAID's and/or analgesics, corticosteriod injections and activity modification. Onset of symptoms was gradual, starting ~3 years ago with gradually worsening course since that time. The patient noted prior procedures on the knee to include unicompartmental arthroplasty on the left knee(s). Patient currently rates pain in the right knee(s) at 10 out of 10 with activity. Patient has night pain, worsening of pain with activity and weight bearing, pain that interferes with activities of daily living, pain with passive range of motion, crepitus and joint swelling. Patient has evidence of periarticular osteophytes and  joint space narrowing of the medial compartment by imaging studies. There is no active infection. Risks, benefits and expectations were discussed with the patient. Risks including but not limited to the risk of anesthesia, blood clots, nerve damage, blood vessel damage, failure of the prosthesis, infection and up to and including death. Patient understand the risks, benefits and expectations and wishes to proceed with surgery.   PCP: Sallee Lange, MD   Discharged Condition: good  Hospital Course:  Patient underwent the above stated procedure on 07/02/2014. Patient tolerated the procedure well and brought to the recovery room in good condition and subsequently to the floor.  POD #1 BP: 107/64 ; Pulse: 84 ; Temp: 99.4 F (37.4 C) ; Resp: 16 Patient reports pain as mild. Seems to be doing well. Walked hall Personnel officer. Ready to be discharged home.  LABS  Basename    HGB  11.7  HCT  35.7    Discharge Exam: General appearance: alert, cooperative and no distress Extremities: Homans sign is negative, no sign of DVT, no edema, redness or tenderness in the calves or thighs and no ulcers, gangrene or trophic changes  Disposition: Home with follow up in 2 weeks   Follow-up Information    Follow up with Mauri Pole, MD. Schedule an appointment as soon as possible for a visit in 2 weeks.   Specialty:  Orthopedic Surgery   Contact information:   [address]  [address] [place] [address] [phone]       Follow up with [facility].   Why:  home health physical therapy   Contact information:   [place] [address] [phone]       Follow up with [facility].   Why:  Right platform for walker   Contact information:   [address] [place] [address] [phone]       Discharge Instructions    Call MD / Call 911    Complete by:  As directed   If you experience chest pain or shortness of breath,  CALL 911 and be transported to the hospital emergency room.  If you develope a fever above 101 F, pus (white drainage) or increased drainage or redness at the wound, or calf pain, call your surgeon's office.     Change dressing    Complete by:  As directed   Maintain surgical dressing until follow up in the clinic. If the edges start to pull up, may reinforce with tape. If the dressing is no longer working, may remove and cover with gauze and tape, but must keep the area dry and clean.  Call with any questions or concerns.     Constipation Prevention    Complete by:  As directed   Drink plenty of fluids.  Prune juice may be helpful.  You may use a stool softener, such as Colace (over the counter) 100 mg twice a day.  Use MiraLax (over the counter) for constipation as needed.     Diet - low sodium heart healthy    Complete by:  As directed      Discharge instructions    Complete by:  As directed   Maintain surgical dressing until follow up in the clinic. If the edges start to pull up, may reinforce with tape. If the dressing is no longer working, may remove and cover with gauze and tape, but must keep the area dry and clean.  Follow up in 2 weeks at [facility]. Call with any questions or concerns.     Increase activity slowly as tolerated    Complete by:  As directed      TED hose    Complete by:  As directed   Use stockings (TED hose) for 2 weeks on both leg(s).  You may remove them at night for sleeping.     Weight bearing as tolerated    Complete by:  As directed   Laterality:  right  Extremity:  Lower             Medication List    STOP taking these medications        cyclobenzaprine 10 MG tablet  Commonly known as:  FLEXERIL     diclofenac 75 MG EC tablet  Commonly known as:  VOLTAREN      TAKE these medications        albuterol 108 (90 BASE) MCG/ACT inhaler  Commonly known as:  PROVENTIL HFA;VENTOLIN HFA  Inhale 2 puffs into the lungs every 4 (four) hours  as needed for wheezing or shortness of breath.     aspirin EC 325 MG tablet  Take 1 tablet (325 mg total) by mouth 2 (two) times daily. Take for 4 weeks.  Start taking on:  07/18/2014     chlorpheniramine 4 MG tablet  Commonly known as:  CHLOR-TRIMETON  Take 4 mg by mouth 2 (two) times daily  as needed for allergies.     DSS 100 MG Caps  Take 100 mg by mouth 2 (two) times daily.     fentaNYL 50 MCG/HR  Commonly known as:  DURAGESIC  Place 1 patch (50 mcg total) onto the skin every 3 (three) days.     ferrous sulfate 325 (65 FE) MG tablet  Take 1 tablet (325 mg total) by mouth 3 (three) times daily after meals.     fluticasone 50 MCG/ACT nasal spray  Commonly known as:  FLONASE  Place 2 sprays into both nostrils 2 (two) times daily.     lamoTRIgine 100 MG tablet  Commonly known as:  LAMICTAL  Take 1 tablet (100 mg total) by mouth at bedtime.     levothyroxine 100 MCG tablet  Commonly known as:  SYNTHROID, LEVOTHROID  Take 100 mcg by mouth daily before breakfast.     LORazepam 1 MG tablet  Commonly known as:  ATIVAN  Take 1 tablet (1 mg total) by mouth at bedtime.     methocarbamol 500 MG tablet  Commonly known as:  ROBAXIN  Take 1 tablet (500 mg total) by mouth every 6 (six) hours as needed for muscle spasms.     ondansetron 4 MG tablet  Commonly known as:  ZOFRAN  Take 4 mg by mouth every 8 (eight) hours as needed for nausea or vomiting.     oxyCODONE 5 MG immediate release tablet  Commonly known as:  Oxy IR/ROXICODONE  Take 1-3 tablets (5-15 mg total) by mouth every 4 (four) hours as needed for breakthrough pain.     polyethylene glycol packet  Commonly known as:  MIRALAX / GLYCOLAX  Take 17 g by mouth 2 (two) times daily.     REFRESH OP  Apply 1 drop to eye 2 (two) times daily as needed (Dry eyes).     REFRESH P.M. OP  Apply 1 application to eye at bedtime.     rivaroxaban 10 MG Tabs tablet  Commonly known as:  XARELTO  Take 1 tablet (10 mg total) by mouth  daily with breakfast.     rizatriptan 10 MG tablet  Commonly known as:  MAXALT  Take 1 tablet (10 mg total) by mouth once as needed for migraine. May repeat in 2 hours if needed     Vitamin D3 10000 UNITS capsule  Take 10,000 Units by mouth every morning.         Signed: West Pugh. Jimmi Sidener   PA-C  07/06/2014, 7:31 AM

## 2014-07-06 NOTE — Telephone Encounter (Signed)
Discussed with pt. Pt does want to start fosamax. [facility] Pt verbalized understanding.

## 2014-07-06 NOTE — Telephone Encounter (Signed)
Pt needs refill on synthroid. Last prescribed dose was 112 mcg. Pt states she ran out of the 112 and has been using the 165mcg since dec. Last tsh in April. Which dose should she be taking. Pt would like a 30 day suppy to [facility] and [facility]. Pt aware you will get this message on Monday.

## 2014-07-08 NOTE — Telephone Encounter (Signed)
With her being on the current dose since December her TSH in April would be a good reflection of how well that dose is doing. Since her TSH showed that the current dose is doing a good job I would stick with the current dose and then 90 day prescription with 3 refills please. 100 g.

## 2014-07-09 DIAGNOSIS — F329 Major depressive disorder, single episode, unspecified: Secondary | ICD-10-CM | POA: Diagnosis not present

## 2014-07-09 DIAGNOSIS — Z471 Aftercare following joint replacement surgery: Secondary | ICD-10-CM | POA: Diagnosis not present

## 2014-07-09 DIAGNOSIS — M797 Fibromyalgia: Secondary | ICD-10-CM | POA: Diagnosis not present

## 2014-07-09 DIAGNOSIS — G8929 Other chronic pain: Secondary | ICD-10-CM | POA: Diagnosis not present

## 2014-07-09 DIAGNOSIS — J4520 Mild intermittent asthma, uncomplicated: Secondary | ICD-10-CM | POA: Diagnosis not present

## 2014-07-09 DIAGNOSIS — M1990 Unspecified osteoarthritis, unspecified site: Secondary | ICD-10-CM | POA: Diagnosis not present

## 2014-07-09 MED ORDER — LEVOTHYROXINE SODIUM 100 MCG PO TABS: 100.0000 ug | ORAL_TABLET | Freq: Every day | ORAL | Status: DC

## 2014-07-09 NOTE — Telephone Encounter (Signed)
Notified patient with her being on the current dose since December her TSH in April would be a good reflection of how well that dose is doing. Since her TSH showed that the current dose is doing a good job Dr. Nicki Reaper would stick with the current dose and then 90 day prescription with 3 refills please. 100 g. Patient verbalized understanding. Med sent to pharmacy.

## 2014-07-10 NOTE — Progress Notes (Signed)
   07/03/14 1705  PT G-Codes **NOT FOR INPATIENT CLASS**  Functional Assessment Tool Used clinical judgement  Functional Limitation Mobility: Walking and moving around  Mobility: Walking and Moving Around Goal Status [phone]) CI  Mobility: Walking and Moving Around Discharge Status [phone]) CI  adding goal status for KAren Hill's Discharge G -code.  Clide Dales, PT Pager: [phone] 07/10/2014

## 2014-07-11 DIAGNOSIS — J4520 Mild intermittent asthma, uncomplicated: Secondary | ICD-10-CM | POA: Diagnosis not present

## 2014-07-11 DIAGNOSIS — F329 Major depressive disorder, single episode, unspecified: Secondary | ICD-10-CM | POA: Diagnosis not present

## 2014-07-11 DIAGNOSIS — M1990 Unspecified osteoarthritis, unspecified site: Secondary | ICD-10-CM | POA: Diagnosis not present

## 2014-07-11 DIAGNOSIS — G8929 Other chronic pain: Secondary | ICD-10-CM | POA: Diagnosis not present

## 2014-07-11 DIAGNOSIS — M797 Fibromyalgia: Secondary | ICD-10-CM | POA: Diagnosis not present

## 2014-07-11 DIAGNOSIS — Z471 Aftercare following joint replacement surgery: Secondary | ICD-10-CM | POA: Diagnosis not present

## 2014-07-13 DIAGNOSIS — G8929 Other chronic pain: Secondary | ICD-10-CM | POA: Diagnosis not present

## 2014-07-13 DIAGNOSIS — M1990 Unspecified osteoarthritis, unspecified site: Secondary | ICD-10-CM | POA: Diagnosis not present

## 2014-07-13 DIAGNOSIS — J4520 Mild intermittent asthma, uncomplicated: Secondary | ICD-10-CM | POA: Diagnosis not present

## 2014-07-13 DIAGNOSIS — Z471 Aftercare following joint replacement surgery: Secondary | ICD-10-CM | POA: Diagnosis not present

## 2014-07-13 DIAGNOSIS — M797 Fibromyalgia: Secondary | ICD-10-CM | POA: Diagnosis not present

## 2014-07-13 DIAGNOSIS — F329 Major depressive disorder, single episode, unspecified: Secondary | ICD-10-CM | POA: Diagnosis not present

## 2014-07-16 ENCOUNTER — Other Ambulatory Visit: Payer: Self-pay | Admitting: Family Medicine

## 2014-07-16 DIAGNOSIS — M797 Fibromyalgia: Secondary | ICD-10-CM | POA: Diagnosis not present

## 2014-07-16 DIAGNOSIS — F329 Major depressive disorder, single episode, unspecified: Secondary | ICD-10-CM | POA: Diagnosis not present

## 2014-07-16 DIAGNOSIS — M1990 Unspecified osteoarthritis, unspecified site: Secondary | ICD-10-CM | POA: Diagnosis not present

## 2014-07-16 DIAGNOSIS — J4520 Mild intermittent asthma, uncomplicated: Secondary | ICD-10-CM | POA: Diagnosis not present

## 2014-07-16 DIAGNOSIS — G8929 Other chronic pain: Secondary | ICD-10-CM | POA: Diagnosis not present

## 2014-07-16 DIAGNOSIS — Z471 Aftercare following joint replacement surgery: Secondary | ICD-10-CM | POA: Diagnosis not present

## 2014-07-16 NOTE — Telephone Encounter (Signed)
Ok this times 5

## 2014-07-17 DIAGNOSIS — Z96651 Presence of right artificial knee joint: Secondary | ICD-10-CM | POA: Diagnosis not present

## 2014-07-17 DIAGNOSIS — Z471 Aftercare following joint replacement surgery: Secondary | ICD-10-CM | POA: Diagnosis not present

## 2014-07-18 DIAGNOSIS — F329 Major depressive disorder, single episode, unspecified: Secondary | ICD-10-CM | POA: Diagnosis not present

## 2014-07-18 DIAGNOSIS — J4520 Mild intermittent asthma, uncomplicated: Secondary | ICD-10-CM | POA: Diagnosis not present

## 2014-07-18 DIAGNOSIS — G8929 Other chronic pain: Secondary | ICD-10-CM | POA: Diagnosis not present

## 2014-07-18 DIAGNOSIS — Z471 Aftercare following joint replacement surgery: Secondary | ICD-10-CM | POA: Diagnosis not present

## 2014-07-18 DIAGNOSIS — M1990 Unspecified osteoarthritis, unspecified site: Secondary | ICD-10-CM | POA: Diagnosis not present

## 2014-07-18 DIAGNOSIS — M797 Fibromyalgia: Secondary | ICD-10-CM | POA: Diagnosis not present

## 2014-07-20 DIAGNOSIS — M1711 Unilateral primary osteoarthritis, right knee: Secondary | ICD-10-CM | POA: Diagnosis not present

## 2014-07-24 DIAGNOSIS — M1711 Unilateral primary osteoarthritis, right knee: Secondary | ICD-10-CM | POA: Diagnosis not present

## 2014-07-26 DIAGNOSIS — M1711 Unilateral primary osteoarthritis, right knee: Secondary | ICD-10-CM | POA: Diagnosis not present

## 2014-07-30 DIAGNOSIS — M1711 Unilateral primary osteoarthritis, right knee: Secondary | ICD-10-CM | POA: Diagnosis not present

## 2014-08-02 DIAGNOSIS — M1711 Unilateral primary osteoarthritis, right knee: Secondary | ICD-10-CM | POA: Diagnosis not present

## 2014-08-07 DIAGNOSIS — M1711 Unilateral primary osteoarthritis, right knee: Secondary | ICD-10-CM | POA: Diagnosis not present

## 2014-08-10 DIAGNOSIS — M1711 Unilateral primary osteoarthritis, right knee: Secondary | ICD-10-CM | POA: Diagnosis not present

## 2014-08-14 DIAGNOSIS — M1711 Unilateral primary osteoarthritis, right knee: Secondary | ICD-10-CM | POA: Diagnosis not present

## 2014-08-15 DIAGNOSIS — Z96651 Presence of right artificial knee joint: Secondary | ICD-10-CM | POA: Diagnosis not present

## 2014-08-15 DIAGNOSIS — M25551 Pain in right hip: Secondary | ICD-10-CM | POA: Diagnosis not present

## 2014-08-15 DIAGNOSIS — Z471 Aftercare following joint replacement surgery: Secondary | ICD-10-CM | POA: Diagnosis not present

## 2014-08-21 ENCOUNTER — Telehealth: Payer: Self-pay | Admitting: Family Medicine

## 2014-08-21 DIAGNOSIS — M5441 Lumbago with sciatica, right side: Secondary | ICD-10-CM | POA: Diagnosis not present

## 2014-08-21 DIAGNOSIS — M4125 Other idiopathic scoliosis, thoracolumbar region: Secondary | ICD-10-CM | POA: Diagnosis not present

## 2014-08-21 NOTE — Telephone Encounter (Signed)
Ntsw, was this already disc with dr Nicki Reaper, im all in favor of reducing when do they want to do it?

## 2014-08-21 NOTE — Telephone Encounter (Signed)
Patient is needing Dr. Nicki Reaper to reduce fentaNYL (DURAGESIC) 50 MCG/HR patch from 50 to 25 due to upcoming surgery.

## 2014-08-22 DIAGNOSIS — M1711 Unilateral primary osteoarthritis, right knee: Secondary | ICD-10-CM | POA: Diagnosis not present

## 2014-08-22 MED ORDER — FENTANYL 25 MCG/HR TD PT72: 25.0000 ug | MEDICATED_PATCH | TRANSDERMAL | Status: DC

## 2014-08-22 NOTE — Telephone Encounter (Signed)
Ok lets do 

## 2014-08-22 NOTE — Telephone Encounter (Signed)
Patient was notified that script is ready for pickup at front desk.

## 2014-08-22 NOTE — Telephone Encounter (Signed)
Patient states that Dr. Nicki Reaper is not aware of this. Patient is having back surgery and she states that her spine specialist recommended that her Fentanyl patches be reduced from 50 mcg to 25 mcg.

## 2014-08-27 DIAGNOSIS — M1711 Unilateral primary osteoarthritis, right knee: Secondary | ICD-10-CM | POA: Diagnosis not present

## 2014-08-28 ENCOUNTER — Ambulatory Visit (HOSPITAL_COMMUNITY)
Admission: RE | Admit: 2014-08-28 | Discharge: 2014-08-28 | Disposition: A | Discharge status: Home / Self Care | Payer: Medicare Other | Source: Ambulatory Visit | Attending: Orthopedic Surgery | Admitting: Orthopedic Surgery

## 2014-08-28 ENCOUNTER — Other Ambulatory Visit (HOSPITAL_COMMUNITY): Payer: Self-pay | Admitting: Orthopedic Surgery

## 2014-08-28 DIAGNOSIS — M4186 Other forms of scoliosis, lumbar region: Secondary | ICD-10-CM | POA: Diagnosis not present

## 2014-08-28 DIAGNOSIS — M4125 Other idiopathic scoliosis, thoracolumbar region: Secondary | ICD-10-CM

## 2014-08-28 DIAGNOSIS — M5136 Other intervertebral disc degeneration, lumbar region: Secondary | ICD-10-CM | POA: Diagnosis not present

## 2014-08-29 ENCOUNTER — Ambulatory Visit (INDEPENDENT_AMBULATORY_CARE_PROVIDER_SITE_OTHER): Payer: Medicare Other | Admitting: Family Medicine

## 2014-08-29 ENCOUNTER — Encounter: Payer: Self-pay | Admitting: Family Medicine

## 2014-08-29 VITALS — BP 124/76 | Temp 98.2°F | Ht 61.25 in | Wt 128.0 lb

## 2014-08-29 DIAGNOSIS — M546 Pain in thoracic spine: Secondary | ICD-10-CM

## 2014-08-29 DIAGNOSIS — J0100 Acute maxillary sinusitis, unspecified: Secondary | ICD-10-CM | POA: Diagnosis not present

## 2014-08-29 DIAGNOSIS — J208 Acute bronchitis due to other specified organisms: Secondary | ICD-10-CM

## 2014-08-29 MED ORDER — SULFAMETHOXAZOLE-TRIMETHOPRIM 800-160 MG PO TABS: 1.0000 | ORAL_TABLET | Freq: Two times a day (BID) | ORAL | Status: DC

## 2014-08-29 MED ORDER — FLUTICASONE PROPIONATE 50 MCG/ACT NA SUSP: 2.0000 | Freq: Two times a day (BID) | NASAL | Status: DC

## 2014-08-29 MED ORDER — FENTANYL 25 MCG/HR TD PT72: 25.0000 ug | MEDICATED_PATCH | TRANSDERMAL | Status: DC

## 2014-08-29 NOTE — Progress Notes (Signed)
   Subjective:    Patient ID: Jenna Contreras, female    DOB: December 19, 1945, 68 y.o.   MRN: [id number]  Cough This is a new problem. Episode onset: 3 days. Associated symptoms include a fever, headaches and nasal congestion.  pt wanted to know how much vit d she should be taking. pt was taking 8,000 units of vit d. Last vit d note states to take 2,000 units. p t wanted to update you on her surgeries. Pt had hip surgery in march 2015. Left knee dec 7th 2015 and right knee April 2016. Will have rods in back in august.  Patient discussed in detail the surgery that she will be coming up. Patient should take vitamin D 2000 units daily  Review of Systems  Constitutional: Positive for fever.  Respiratory: Positive for cough.   Neurological: Positive for headaches.      Dr Marykay Lex in [place] will be doing rods in the back  Objective:   Physical Exam  Please see below, electronic medical records would not allow proper documentation in this space  Patient not respiratory distress heart regular    Assessment & Plan:  Significant discussion held with patient regarding her back pain she is going to be getting back surgery mid-August she will be tapering off fentanyl patch. She will use oxycodone code own for pain control notify us if she needs additional prescription. I also warned the patient not to take pain medicine near bedtime warned her to avoid using it with a muscle relaxant at the same time in a new discussed with her the risk for addiction as well as accidental death  Bronchial illness with sinusitis antibiotics prescribed warning signs discussed. She was cautioned to avoid excessive pain medication while sick. She was also warned regarding complications of infection and need for follow-up and possible x-rays if getting worse  25 minutes spent with patient today. She does have significant kyphosis in her back she will getting getting back surgery she has cough but no crackles in the lungs  heart is regular mild sinus tenderness eardrums normal

## 2014-08-29 NOTE — Progress Notes (Signed)
   08/29/14 1600  OT G-codes **NOT FOR INPATIENT CLASS**  Functional Assessment Tool Used clinical observation and judgment  Functional Limitation Self care  Self Care Current Status (A2505) CJ  Self Care Goal Status (L9767) CI  Self Care Discharge Status [phone]) Kae Heller, OTR/L [phone] 08/29/2014

## 2014-09-03 DIAGNOSIS — M1711 Unilateral primary osteoarthritis, right knee: Secondary | ICD-10-CM | POA: Diagnosis not present

## 2014-09-06 DIAGNOSIS — M1711 Unilateral primary osteoarthritis, right knee: Secondary | ICD-10-CM | POA: Diagnosis not present

## 2014-09-12 DIAGNOSIS — M4125 Other idiopathic scoliosis, thoracolumbar region: Secondary | ICD-10-CM | POA: Diagnosis not present

## 2014-09-17 DIAGNOSIS — M4125 Other idiopathic scoliosis, thoracolumbar region: Secondary | ICD-10-CM | POA: Diagnosis not present

## 2014-09-20 DIAGNOSIS — M1711 Unilateral primary osteoarthritis, right knee: Secondary | ICD-10-CM | POA: Diagnosis not present

## 2014-09-21 DIAGNOSIS — M4125 Other idiopathic scoliosis, thoracolumbar region: Secondary | ICD-10-CM | POA: Diagnosis not present

## 2014-09-24 DIAGNOSIS — M4125 Other idiopathic scoliosis, thoracolumbar region: Secondary | ICD-10-CM | POA: Diagnosis not present

## 2014-09-27 ENCOUNTER — Ambulatory Visit: Payer: Medicare Other | Admitting: Family Medicine

## 2014-09-27 ENCOUNTER — Telehealth: Payer: Self-pay | Admitting: Family Medicine

## 2014-09-27 DIAGNOSIS — M4125 Other idiopathic scoliosis, thoracolumbar region: Secondary | ICD-10-CM | POA: Diagnosis not present

## 2014-09-27 NOTE — Telephone Encounter (Signed)
Pt is requesting robaxin to be called in. Pt is requesting a 90 day supply.  [facility] [place]

## 2014-09-27 NOTE — Telephone Encounter (Signed)
May send in [facility] a, #30, one 3 times a day when necessary, caution drowsiness, 2 refills

## 2014-09-27 NOTE — Telephone Encounter (Signed)
Patient would like rx for parafon forte

## 2014-09-28 MED ORDER — CHLORZOXAZONE 500 MG PO TABS: ORAL_TABLET | ORAL | Status: DC

## 2014-09-28 NOTE — Telephone Encounter (Signed)
Called patient and informed her that [facility] per [place]. I also informed patient to stop robaxin while on Parafon. Patient verbalized understanding.

## 2014-10-01 DIAGNOSIS — M4125 Other idiopathic scoliosis, thoracolumbar region: Secondary | ICD-10-CM | POA: Diagnosis not present

## 2014-10-03 ENCOUNTER — Telehealth: Payer: Self-pay | Admitting: Family Medicine

## 2014-10-03 ENCOUNTER — Other Ambulatory Visit: Payer: Self-pay | Admitting: *Deleted

## 2014-10-03 DIAGNOSIS — Z01810 Encounter for preprocedural cardiovascular examination: Secondary | ICD-10-CM

## 2014-10-03 MED ORDER — ALBUTEROL SULFATE HFA 108 (90 BASE) MCG/ACT IN AERS: 2.0000 | INHALATION_SPRAY | RESPIRATORY_TRACT | Status: DC | PRN

## 2014-10-03 MED ORDER — FLUTICASONE PROPIONATE 50 MCG/ACT NA SUSP: 2.0000 | Freq: Two times a day (BID) | NASAL | Status: DC

## 2014-10-03 NOTE — Telephone Encounter (Signed)
Discussed with pt. Pt scheduled ov for august 2nd. Forms are at nurse's station. Order for cardiology referral put in.

## 2014-10-03 NOTE — Telephone Encounter (Signed)
Nurse's-I received notification from the [facility] [place] [phone] and fax [phone]) who will be doing her spinal surgery. The surgery she has coming up is a fairly major surgery. The doctor is seeking medical clearance. The patient will need 2 evaluations. The first evaluation is with [place], this is for the purpose of medical clearance. She will need a up-to-date CBC, comprehensive metabolic profile, chest x-ray and EKG. This can be ordered and completed when she comes in for this evaluation. The second evaluation is with cardiology as cardiac preoperative clearance. Please do the following: Find out from the patient when her surgery is. More than likely we will need to quickly move forward to meet approval before her surgery. Go ahead and initiate cardiology referral for the purpose of cardiac clearance before surgery. Also schedule the patient with [place] for medical clearance. Please keep the information from the [facility] available for when the patient comes to be seen.

## 2014-10-03 NOTE — Telephone Encounter (Signed)
[facility]

## 2014-10-04 DIAGNOSIS — M4125 Other idiopathic scoliosis, thoracolumbar region: Secondary | ICD-10-CM | POA: Diagnosis not present

## 2014-10-05 ENCOUNTER — Other Ambulatory Visit: Payer: Self-pay | Admitting: *Deleted

## 2014-10-05 DIAGNOSIS — Z471 Aftercare following joint replacement surgery: Secondary | ICD-10-CM | POA: Diagnosis not present

## 2014-10-05 DIAGNOSIS — Z96651 Presence of right artificial knee joint: Secondary | ICD-10-CM | POA: Diagnosis not present

## 2014-10-05 MED ORDER — FLUTICASONE PROPIONATE 50 MCG/ACT NA SUSP: 1.0000 | Freq: Two times a day (BID) | NASAL | Status: DC

## 2014-10-08 ENCOUNTER — Ambulatory Visit: Payer: Medicare Other | Admitting: Cardiology

## 2014-10-08 DIAGNOSIS — M4125 Other idiopathic scoliosis, thoracolumbar region: Secondary | ICD-10-CM | POA: Diagnosis not present

## 2014-10-09 ENCOUNTER — Encounter: Payer: Self-pay | Admitting: Family Medicine

## 2014-10-09 ENCOUNTER — Ambulatory Visit (INDEPENDENT_AMBULATORY_CARE_PROVIDER_SITE_OTHER): Payer: Medicare Other | Admitting: Family Medicine

## 2014-10-09 ENCOUNTER — Ambulatory Visit (HOSPITAL_COMMUNITY)
Admission: RE | Admit: 2014-10-09 | Discharge: 2014-10-09 | Disposition: A | Discharge status: Home / Self Care | Payer: Medicare Other | Source: Ambulatory Visit | Attending: Family Medicine | Admitting: Family Medicine

## 2014-10-09 ENCOUNTER — Other Ambulatory Visit: Payer: Self-pay | Admitting: *Deleted

## 2014-10-09 ENCOUNTER — Telehealth: Payer: Self-pay | Admitting: Family Medicine

## 2014-10-09 VITALS — BP 110/74 | Ht 61.25 in | Wt 129.5 lb

## 2014-10-09 DIAGNOSIS — E038 Other specified hypothyroidism: Secondary | ICD-10-CM | POA: Diagnosis not present

## 2014-10-09 DIAGNOSIS — J45909 Unspecified asthma, uncomplicated: Secondary | ICD-10-CM | POA: Diagnosis not present

## 2014-10-09 DIAGNOSIS — G8929 Other chronic pain: Secondary | ICD-10-CM

## 2014-10-09 DIAGNOSIS — J4530 Mild persistent asthma, uncomplicated: Secondary | ICD-10-CM | POA: Diagnosis not present

## 2014-10-09 DIAGNOSIS — R3589 Other polyuria: Secondary | ICD-10-CM

## 2014-10-09 DIAGNOSIS — Z01818 Encounter for other preprocedural examination: Secondary | ICD-10-CM

## 2014-10-09 DIAGNOSIS — R358 Other polyuria: Secondary | ICD-10-CM | POA: Diagnosis not present

## 2014-10-09 DIAGNOSIS — M549 Dorsalgia, unspecified: Secondary | ICD-10-CM

## 2014-10-09 LAB — POCT URINALYSIS DIPSTICK
Blood, UA: NEGATIVE
LEUKOCYTES UA: NEGATIVE
Nitrite, UA: NEGATIVE
Protein, UA: NEGATIVE
SPEC GRAV UA: 1.02
pH, UA: 5

## 2014-10-09 MED ORDER — BECLOMETHASONE DIPROPIONATE 80 MCG/ACT IN AERS: 2.0000 | INHALATION_SPRAY | Freq: Two times a day (BID) | RESPIRATORY_TRACT | Status: DC

## 2014-10-09 NOTE — Telephone Encounter (Signed)
Med sent to  Pharm. Pt notified on voicemail.

## 2014-10-09 NOTE — Telephone Encounter (Signed)
Pt is needing a refill on her qvar 80 mcq inhaler   [facility]

## 2014-10-09 NOTE — Progress Notes (Signed)
   Subjective:    Patient ID: Jenna Contreras, female    DOB: September 15, 1945, 69 y.o.   MRN: [id number]  Back Pain This is a chronic problem. The current episode started more than 1 year ago. Associated symptoms include headaches. Pertinent negatives include no chest pain or fever. Treatments tried: Parfon Forte.   Reactive airway-patient relates intermittent cough and wheeze recently using albuterol she states in the past if she uses her steroid inhaler for 1-2 weeks it will typically calm situationthe   Patient in today for surgical clearance for back surgery. Patient states she is no longer using fentanyl patch he states her pain is actually doing much much better she recently stated that people at church prayed for her  This patient doesn't fact have history of migraines takes medication and does a good job helping her She also suffers with mild insomnia uses Ativan at nighttime to help with this Patient does have osteopenia P [place]/osteoporosis she uses positive neck once a week with calcium and vitamin D Patient also has hypothyroidism takes medication on a regular basis does not have issues with it \ Patient would also like to discuss asthma symptoms. Review of Systems  Constitutional: Negative for fever and fatigue.  HENT: Negative for congestion.   Respiratory: Positive for cough, shortness of breath and wheezing.   Cardiovascular: Negative for chest pain and leg swelling.  Musculoskeletal: Positive for back pain and arthralgias.  Neurological: Positive for headaches.       Objective:   Physical Exam  Constitutional: She appears well-nourished. No distress.  Cardiovascular: Normal rate, regular rhythm and normal heart sounds.   No murmur heard. Pulmonary/Chest: Effort normal and breath sounds normal. No respiratory distress.  Musculoskeletal: She exhibits no edema.  Lymphadenopathy:    She has no cervical adenopathy.  Neurological: She is alert. She exhibits normal  muscle tone.  Psychiatric: Her behavior is normal.  Vitals reviewed.   On physical exam significant kyphosis and scoliosis lab work was ordered for her upcoming surgery Chest x-ray ordered I believe she is having minimal asthma issue. She can use steroid inhaler over the next 2 weeks I believe it would be fine for her to have surgery. Because of the extensive nature of the surgery plus significant hospitalization I believe the patient should go through cardiac evaluation preoperatively Pain control very good currently hopefully she will not have to go back on a pain patch in the future Ativan at nighttime as needed for insomnia   await the results of the tests greater than 25 minutes spent with patient reviewing multiple issues

## 2014-10-09 NOTE — Telephone Encounter (Signed)
QVAR 80 2 PUFFS BID FOR THE NEXT COUPLE OF WEEKS.

## 2014-10-09 NOTE — Telephone Encounter (Signed)
Called pt bc i did not see on med list. Pt states while at ov she discuss her asthma issues and was told to call back with name of inhaler. Pt states it expired 2010. She has not been taking so she also needs directions on how often to take.

## 2014-10-10 ENCOUNTER — Encounter: Payer: Self-pay | Admitting: Cardiology

## 2014-10-10 ENCOUNTER — Ambulatory Visit (INDEPENDENT_AMBULATORY_CARE_PROVIDER_SITE_OTHER): Payer: Medicare Other | Admitting: Cardiology

## 2014-10-10 VITALS — BP 118/82 | HR 82 | Ht 61.0 in | Wt 128.0 lb

## 2014-10-10 DIAGNOSIS — Z01818 Encounter for other preprocedural examination: Secondary | ICD-10-CM

## 2014-10-10 DIAGNOSIS — J4520 Mild intermittent asthma, uncomplicated: Secondary | ICD-10-CM | POA: Diagnosis not present

## 2014-10-10 DIAGNOSIS — G459 Transient cerebral ischemic attack, unspecified: Secondary | ICD-10-CM

## 2014-10-10 DIAGNOSIS — Z01810 Encounter for preprocedural cardiovascular examination: Secondary | ICD-10-CM | POA: Diagnosis not present

## 2014-10-10 DIAGNOSIS — I6523 Occlusion and stenosis of bilateral carotid arteries: Secondary | ICD-10-CM | POA: Diagnosis not present

## 2014-10-10 LAB — CBC WITH DIFFERENTIAL/PLATELET
BASOS ABS: 0.1 10*3/uL (ref 0.0–0.2)
Basos: 1 %
EOS (ABSOLUTE): 0.2 10*3/uL (ref 0.0–0.4)
EOS: 2 %
HEMOGLOBIN: 14.9 g/dL (ref 11.1–15.9)
Hematocrit: 44.3 % (ref 34.0–46.6)
Immature Grans (Abs): 0 10*3/uL (ref 0.0–0.1)
Immature Granulocytes: 0 %
Lymphocytes Absolute: 4 10*3/uL — ABNORMAL HIGH (ref 0.7–3.1)
Lymphs: 37 %
MCH: 28.9 pg (ref 26.6–33.0)
MCHC: 33.6 g/dL (ref 31.5–35.7)
MCV: 86 fL (ref 79–97)
MONOS ABS: 0.7 10*3/uL (ref 0.1–0.9)
Monocytes: 7 %
NEUTROS ABS: 5.8 10*3/uL (ref 1.4–7.0)
Neutrophils: 53 %
Platelets: 356 10*3/uL (ref 150–379)
RBC: 5.15 x10E6/uL (ref 3.77–5.28)
RDW: 15 % (ref 12.3–15.4)
WBC: 10.8 10*3/uL (ref 3.4–10.8)

## 2014-10-10 LAB — BASIC METABOLIC PANEL
BUN / CREAT RATIO: 17 (ref 11–26)
BUN: 12 mg/dL (ref 8–27)
CHLORIDE: 101 mmol/L (ref 97–108)
CO2: 23 mmol/L (ref 18–29)
Calcium: 10 mg/dL (ref 8.7–10.3)
Creatinine, Ser: 0.71 mg/dL (ref 0.57–1.00)
GFR calc Af Amer: 100 mL/min/{1.73_m2} (ref >59)
GFR calc non Af Amer: 87 mL/min/{1.73_m2} (ref >59)
GLUCOSE: 85 mg/dL (ref 65–99)
Potassium: 4.4 mmol/L (ref 3.5–5.2)
SODIUM: 142 mmol/L (ref 134–144)

## 2014-10-10 LAB — TSH: TSH: 0.275 u[IU]/mL — ABNORMAL LOW (ref 0.450–4.500)

## 2014-10-10 NOTE — Progress Notes (Signed)
Cardiology Office Note  Date: 10/10/2014   ID: Jenna Contreras, DOB 1945/06/21, MRN [id number]  PCP: Sallee Lange, MD  Consulting Cardiologist: Rozann Lesches, MD   Chief Complaint  Patient presents with  . Preoperative evaluation    History of Present Illness: Jenna Contreras is a 69 y.o. female referred for cardiology consultation by Dr. Wolfgang Phoenix. I reviewed his office visit note from yesterday as well as information from Dr. Rowe Pavy in [place]. She is scheduled to undergo spine surgery under general anesthesia later this month and is referred for preoperative cardiac evaluation.  From a symptom perspective she reports chronic back pain, has been undergoing physical therapy in preparation for spine surgery, also wearing a brace. Asthma was felt to be under reasonable control on evaluation by Dr. Wolfgang Phoenix yesterday, she was placed on QVAR. Chest x-ray from 10/09/2014 reported no active cardiopulmonary disease. She has already undergone several orthopedic surgeries in the last few years without cardiac complication.  She is functionally limited by her back pain, but lives in her own home alone, states that she is able to vacuum and do other cleaning, has also been walking up to a mile at a time outdoors recently. With these activities she reports no angina or unusual shortness of breath, has had no palpitations or syncope.  She underwent previous cardiac evaluation in December 2014, had a normal Cardiolite study at that time as detailed below. Her follow-up ECG today is normal.   Past Medical History  Diagnosis Date  . Scoliosis   . Migraines   . Arthritis   . Hypothyroidism   . Fibromyalgia   . Depression   . OCD (obsessive compulsive disorder)   . Arthritis   . Osteoporosis   . Psoriasis   . Constipation   . GERD (gastroesophageal reflux disease)   . Anxiety   . Bipolar 2 disorder   . Low BP   . History of gout   . HNP (herniated nucleus pulposus with  myelopathy), thoracic   . History of skin cancer   . Asthma   . Heart murmur     As small child   . Left eye injury     In ED 06/17/2014     Past Surgical History  Procedure Laterality Date  . Dilation and curettage of uterus  x3  . Tubal ligation    . Arthroscopic left knee surgery    . Wrist surgery    . Mandible surgery      X 2  . Tonsillectomy    . Total hip arthroplasty Right 05/09/2013    Procedure: RIGHT TOTAL HIP ARTHROPLASTY ANTERIOR APPROACH;  Surgeon: Mauri Pole, MD;  Location: [facility];  Service: Orthopedics;  Laterality: Right;  . Partial knee arthroplasty Left 02/12/2014    Procedure: LEFT KNEE UNICOMPARTMENTAL MEDIALLY ARTHROPLASTY ;  Surgeon: Mauri Pole, MD;  Location: [facility];  Service: Orthopedics;  Laterality: Left;  . Partial knee arthroplasty Right 07/02/2014    Procedure: RIGHT UNI KNEE ARTHOPLASTY MEDIALLY;  Surgeon: Paralee Cancel, MD;  Location: [facility];  Service: Orthopedics;  Laterality: Right;  . Hip surgery Right 2015  . Knee surgery Bilateral 2015, 2016    Current Outpatient Prescriptions  Medication Sig Dispense Refill  . albuterol (PROVENTIL HFA;VENTOLIN HFA) 108 (90 BASE) MCG/ACT inhaler Inhale 2 puffs into the lungs every 4 (four) hours as needed for wheezing or shortness of breath. 1 Inhaler 2  . alendronate (FOSAMAX) 70 MG tablet Take 1 tablet (70 mg  total) by mouth every 7 (seven) days. Take with a full glass of water on an empty stomach. 12 tablet 1  . Artificial Tear Ointment (REFRESH P.M. OP) Apply 1 application to eye at bedtime.    . beclomethasone (QVAR) 80 MCG/ACT inhaler Inhale 2 puffs into the lungs 2 (two) times daily. 1 Inhaler 0  . chlorpheniramine (CHLOR-TRIMETON) 4 MG tablet Take 4 mg by mouth 2 (two) times daily as needed for allergies.    . chlorzoxazone (PARAFON) 500 MG tablet Take 1 tablet three times a day when necessary *Caution Drowsiness* 30 tablet 2  . Cholecalciferol (VITAMIN D) 2000 UNITS CAPS Take by mouth daily.    Marland Kitchen  docusate sodium 100 MG CAPS Take 100 mg by mouth 2 (two) times daily. (Patient taking differently: Take 100 mg by mouth 2 (two) times daily as needed. ) 10 capsule 0  . ferrous sulfate 325 (65 FE) MG tablet Take 1 tablet (325 mg total) by mouth 3 (three) times daily after meals.  3  . fluticasone (FLONASE) 50 MCG/ACT nasal spray Place 1 spray into both nostrils 2 (two) times daily. 16 g 1  . lamoTRIgine (LAMICTAL) 100 MG tablet Take 1 tablet (100 mg total) by mouth at bedtime. 90 tablet 0  . levothyroxine (SYNTHROID, LEVOTHROID) 100 MCG tablet Take 1 tablet (100 mcg total) by mouth daily before breakfast. 90 tablet 3  . LORazepam (ATIVAN) 1 MG tablet TAKE 1 TABLET BY MOUTH AT BEDTIME 30 tablet 5  . ondansetron (ZOFRAN) 4 MG tablet Take 4 mg by mouth every 8 (eight) hours as needed for nausea or vomiting.    . polyethylene glycol (MIRALAX / GLYCOLAX) packet Take 17 g by mouth 2 (two) times daily. 14 each 0  . Polyvinyl Alcohol-Povidone (REFRESH OP) Apply 1 drop to eye 2 (two) times daily as needed (Dry eyes).    . rizatriptan (MAXALT) 10 MG tablet Take 1 tablet (10 mg total) by mouth once as needed for migraine. May repeat in 2 hours if needed 36 tablet 2   No current facility-administered medications for this visit.    Allergies:  Prednisone; Adhesive; Betadine; Demerol; Latex; Levaquin; Ampicillin; Cephalexin; Doxycycline; Erythromycin; Keflex; Penicillins; and Tetracyclines & related   Social History: The patient  reports that she quit smoking about 35 years ago. Her smoking use included Cigarettes. She has a 38 pack-year smoking history. She has never used smokeless tobacco. She reports that she does not drink alcohol or use illicit drugs.   Family History: The patient's family history includes Colon cancer in her father; Diabetes in her mother; Heart disease in her brother and mother.   ROS:  Please see the history of present illness. Otherwise, complete review of systems is positive for  chronic recurring back pain, also migraine headaches.  All other systems are reviewed and negative.   Physical Exam: VS:  BP 118/82 mmHg  Pulse 82  Ht 5\' 1"  (1.549 m)  Wt 128 lb (58.06 kg)  BMI 24.20 kg/m2  SpO2 96%, BMI Body mass index is 24.2 kg/(m^2).  Wt Readings from Last 3 Encounters:  10/10/14 128 lb (58.06 kg)  10/09/14 129 lb 8 oz (58.741 kg)  08/29/14 128 lb (58.06 kg)     General: Patient appears younger than stated age, no distress. HEENT: Conjunctiva and lids normal, oropharynx clear. Neck: Supple, no elevated JVP or carotid bruits, no thyromegaly. Lungs: Clear to auscultation, nonlabored breathing at rest. Cardiac: Regular rate and rhythm, no S3 or significant systolic murmur,  no pericardial rub. Abdomen: Soft, nontender, bowel sounds present. Extremities: No pitting edema, distal pulses 2+. Skin: Warm and dry. Musculoskeletal: Kyphoscoliosis present. Neuropsychiatric: Alert and oriented x3, affect grossly appropriate.   ECG: ECG is ordered today and shows normal sinus rhythm. Previous tracing in October 2014 showed normal sinus rhythm with nonspecific T-wave changes lead artifact.   Recent Labwork: 11/26/2013: ALT 21; AST 18 07/03/2014: Hemoglobin 11.7*; Platelets 246 10/09/2014: BUN 12; Creatinine, Ser 0.71; Potassium 4.4; Sodium 142; TSH 0.275*     Component Value Date/Time   CHOL 206* 01/18/2014 1223   TRIG 69 01/18/2014 1223   HDL 69 01/18/2014 1223   CHOLHDL 3.0 01/18/2014 1223   VLDL 14 01/18/2014 1223   LDLCALC 123* 01/18/2014 1223    Other Studies Reviewed Today:  Lexiscan Cardiolite 12/26/2012: NUCLEAR MEDICINE STRESS MYOVIEW STUDY WITH SPECT AND LEFT VENTRICULAR EJECTION FRACTION  Radionuclide Data: One-day rest/stress protocol performed with 10/30 mCi of Tc-31m Cardiolite.  Stress Data: The patient was stressed according to the Lexiscan protocol for 3:07. The resting HR of 68 bpm rose to a maximum of 109 bpm. This value represents 71% of  the maximal, age-predicted HR. The resting BP of 112/83 mmHg rose to a maximum of 121/71 mmHg. The test was stopped due to protocol completion.  EKG: Resting ECG showed normal sinus rhythm. With infusion, there were nondiagnostic T wave changes. One PVC noted in recovery.  Scintigraphic Data: The raw data showed soft tissue breast attenuation artifact.  The stress images showed normal homogeneous uptake of the myocardial radioactive tracer in all wall segments, with no changes seen when compared to the resting images.  Wall motion was normal in all wall segments, with a calculated LVEF of 89%.  IMPRESSION: 1. Normal Lexiscan Cardiolite stress test.  2. No evidence of ischemia or scar.  3. Normal LV systolic function, LVEF 69%.  Carotid Dopplers 12/02/2011: 0-39% bilateral ICA stenoses.   Assessment and Plan:  1. Preoperative cardiac evaluation in a 69 year old woman scheduled for spine surgery under general anesthesia by Dr. Marykay Lex in [place] later this month. She has no documented history of obstructive CAD or myocardial infarction, underwent a Lexiscan Cardiolite within the last 2 years that was normal. Follow-up ECG today is normal and recent chest x-ray showed no acute process. She does not report any angina or unusual shortness of breath with typical ADLs, and describes activities exceeding 4 METs without major functional cardiac limitation. At this time I do not anticipate any further cardiac testing, and expect that she should be able to proceed with planned surgery at an acceptable and relatively low perioperative risk for major cardiac event.  2. Prior history of mild carotid atherosclerosis (0-39% in 2013), asymptomatic at this time. No significant bruits on examination. Keep routine follow-up with Dr. Wolfgang Phoenix and focus on risk factor modification. LDL was 123 last year. Consider low-dose aspirin and potentially statin for aggressive management.  3. History of asthma,  recently assessed by Dr. Wolfgang Phoenix.  Current medicines were reviewed with the patient today.   Orders Placed This Encounter  Procedures  . EKG 12-Lead    Disposition: FU with me as needed.   Signed, Satira Sark, MD, [facility] 10/10/2014 3:54 PM    [place] [facility] [place], [place],  [address] Phone: [phone]; Fax: [phone]

## 2014-10-10 NOTE — Patient Instructions (Signed)
Your physician recommends that you continue on your current medications as directed. Please refer to the Current Medication list given to you today.  Your physician recommends that you schedule a follow-up appointment in: as needed  

## 2014-10-11 DIAGNOSIS — M4125 Other idiopathic scoliosis, thoracolumbar region: Secondary | ICD-10-CM | POA: Diagnosis not present

## 2014-10-13 ENCOUNTER — Encounter: Payer: Self-pay | Admitting: Family Medicine

## 2014-10-15 DIAGNOSIS — M4125 Other idiopathic scoliosis, thoracolumbar region: Secondary | ICD-10-CM | POA: Diagnosis not present

## 2014-10-16 ENCOUNTER — Other Ambulatory Visit: Payer: Self-pay | Admitting: *Deleted

## 2014-10-16 ENCOUNTER — Telehealth: Payer: Self-pay | Admitting: Family Medicine

## 2014-10-16 DIAGNOSIS — E039 Hypothyroidism, unspecified: Secondary | ICD-10-CM

## 2014-10-16 MED ORDER — LEVOTHYROXINE SODIUM 88 MCG PO TABS: 88.0000 ug | ORAL_TABLET | Freq: Every day | ORAL | Status: DC

## 2014-10-18 DIAGNOSIS — M4125 Other idiopathic scoliosis, thoracolumbar region: Secondary | ICD-10-CM | POA: Diagnosis not present

## 2014-10-19 ENCOUNTER — Telehealth: Payer: Self-pay | Admitting: Internal Medicine

## 2014-10-19 DIAGNOSIS — Z01818 Encounter for other preprocedural examination: Secondary | ICD-10-CM | POA: Diagnosis not present

## 2014-10-19 DIAGNOSIS — M4125 Other idiopathic scoliosis, thoracolumbar region: Secondary | ICD-10-CM | POA: Diagnosis not present

## 2014-10-19 NOTE — Telephone Encounter (Signed)
Patient is having surgery on 10/30/14 in [place].  Patient spoke with Surgeon's office (Dr. Rowe Pavy, [phone]) today at  3:15pm for PreOp.  Patient is concerned because she has not obtained surgical clearance from [name].  Patient says that she believes that Dr. Derry Lory office did not send in the appropriate forms.  Per Daneil Dan - we have not received forms from Dr. Derry Lory office. Called and spoke with Anderson Malta at Dr. Derry Lory office.  She Is faxing form to [place] today.   Scheduled patient to see [name] on 8/17 for surgical clearance. Patient notified.  To Daneil Dan for follow up

## 2014-10-19 NOTE — Telephone Encounter (Signed)
Received form. Will hold form until pt's appt with MR on 8.17.16. Form will be completed at appt time. Nothing further needed at this time.

## 2014-10-24 ENCOUNTER — Encounter: Payer: Self-pay | Admitting: Internal Medicine

## 2014-10-24 ENCOUNTER — Ambulatory Visit (INDEPENDENT_AMBULATORY_CARE_PROVIDER_SITE_OTHER): Payer: Medicare Other | Admitting: Internal Medicine

## 2014-10-24 VITALS — BP 98/66 | HR 87 | Ht 61.0 in | Wt 130.0 lb

## 2014-10-24 DIAGNOSIS — Z01811 Encounter for preprocedural respiratory examination: Secondary | ICD-10-CM | POA: Diagnosis not present

## 2014-10-24 DIAGNOSIS — J4520 Mild intermittent asthma, uncomplicated: Secondary | ICD-10-CM

## 2014-10-24 DIAGNOSIS — I6523 Occlusion and stenosis of bilateral carotid arteries: Secondary | ICD-10-CM | POA: Diagnosis not present

## 2014-10-24 MED ORDER — FLUTICASONE PROPIONATE HFA 110 MCG/ACT IN AERO: 2.0000 | INHALATION_SPRAY | Freq: Two times a day (BID) | RESPIRATORY_TRACT | Status: DC

## 2014-10-24 NOTE — Progress Notes (Signed)
Subjective:    Patient ID: Jenna Contreras, female    DOB: [birth year], 69 y.o.   MRN: [id number]  HPI IOV Nov 7454:  69 year old female accompanied with husband. States was in hospital ([facility]) with "pneumonia" around early September 2012. Spent 1 week in hospital. At discharge she was informed that the pneumonia had cleared and she no longer needed bronch but needed followup 5 weeks. States she was given a week of levoflox but now realizes that she should not have been given that due to risk of ligament injury. Around 11/25/10 visited [facility] (at this time unhappy with [facility]) due to persistence of dry cough and diffuse bodyache (concerned side effect of levoflox). CXR was clear (personally reviewed). Had CBC, bmet, ck, esr - all normal. Ionized calcium was slightly normal.   Currently being bothered by dyspnea and chronic cough and also states that she forgot to mention she has birds. Reports dyspnea since admission in sept 2012. Feels likely getting worse. Rates it as moderate-severe. Even trying to talk or exertion like getting out of bed makes her dyspneic. Dyspnea improved by nebs. Cough also present since pneumonia. Was productive of brown sputum but now dry. Rates cough as moderate- severe. Coughs during sleep and when changing posture from supine to sitting. There is associated wheezing present.  Denies hemoptysis,  Both preventing her from cleaning birds room (see below)  PMHx 1. Asthma  - diagnosed mid 66s. Unclear how dx was made but she reckons it was based on PEF. Describes cough variant asthma. Hx of "respiratory arrest" in 1998 + but says she refused to go to ER and get Rx. Uses albuterol prn for asthma "attacks". Typicall gets attacks 1-4 times per week upon exposure to birds at home  2. Scoliosis  3. Admitted for depression and OCD in 2007  4. Irritable bowel syndrome.  2. Arthritis.  3. Scoliosis.  4. Migraines.  5. Hypothyroidism.  6.  Fibromyalgia.  Social 1. Birds - parrots with lot of feather dust in one room. Since 2000. Large Macaw and a cocakteel and a quaker. Total 3 birds. Gets exposed atleast 1-2 per week 2. Works as Dentist and domestic Geologist, engineering.  3. Husband and she work together in Scientist, research (physical sciences). In [place], [place]. Married x 27 years 4. Ex-smoker. Smoked cigs 15-20  Years x 1 ppd. Quit 35 years ago 5. Denies MJ, coccaine.  6. Severeal grandkids and 1 great grand kid    Nurse wil walk you for oxygen levels  Have full PFT breathing test - once test done, give [place] a call so I can review you aand advise over phone next step  Telephone call Nov 2012 Updated patient on CT results NOv 2012 -   #nomral lung parenchyma. Still with cough and dyspnea.  DDx of post pna cough taking time to resolve explained but she prefers workup now. She does not think that is the case  #small lung nodule < 65m  Plan as agreed with patient  - get records from Dr RMosetta Anis Esp spirometry, noptes, and allergy testing - [name] please organize this and leave in my look at  - if above are unhelpful, will get Methacholine chalenge and if that is negative will get CPST  - neeeds repeat CT chest wtihout contrast in 1  year     OV 08/05/2011  In terms of dyspnea and cough - lot better, almost gone. Has not had any asthma attacks  since sept 2012. Not on any maintenance mdi. Learning today not seen Dr Donnetta Simpers since 1996; so no records on details of the asthma diagnosis.Marland Kitchen However, she does report wheezing oonce daily with exposure to her birds. No associaed symptoms. No relieving factors   Past, Family, Social reviewed: reported TIA episodes 2 time since last visit. Says PMD refused to get CT brain and therefore wants me to address this isuseu. Most recent TIA 4 weeks ago was very upset with husband. Feels bp rose and next day could not remember peoples names and had word amnesia.  Still struggling with remembering. No focal weakness. Just memory issues.  #Memory issues  - will refer to neurology  #Wheezing  - have methacholine challenge test  #Followup  - will call with result  OV 09/03/2011 Here to discuss methacholine challenge results of 08/12/11:  - 25% reducction in fvc and 28% reductiion in fev1 at 30mcg dose - and c/w asthma/ Her interimittent wheeze continues and is of moderate severity. We discussed her birds: clearly she is very emotionally involved about her birds because she has had them > 10 years. There  Is lot of bird dust exposure but she says they talk to her and are emotionally dependent on her.  Otherwise no new resp issues  Past, Family, Social reviewed: no change since last visit but intermittent "tia" symptoms of blurred vision continue. Neuro appt is only 10/03/11 per hx. She interested in speeding this up.  #ASthma  - methacholine challenge test consistent with asthma  - start qvar 71mcg 2 puff twice daily with spacer; nurse will give sample and teach technique and give script  - understand that cutting down bird exposure is difficult; we will closely monitor your lung health  #Followup  - 3 months or sooner for asthma followup  - our coordinator will see if neurologist can see you sooner than July 27th, 2013   OV 12/03/2011 Followup mild persistent asthma.   In interim: Took qvar for a while and then stopped because she was feeling well though she knew that qvar is maintenance. She says no adversed events since coming off qvar. Asymptomatic. No albuterol rescue use. No nocturnal awakening. No wheeze. No dyspnea. No cough. She has had her flu shot. She wants to monitor herself off qvar  Past, Family, Social reviewed: seen by Dr Jacelyn Grip of neuro per her hx: no TIA. One bird died. Another bird has cancer and expected to die in weeks to months   REC Glad you are feeling better and asymptomatic  Glad you had flu shot  As discussed we agreed that we  will watch you off QVAR  Return in 9 months or sooner if needed  OV 09/01/2012  Since last visit overall well but on 05/02/12 went tot [facility] ER for for atypical chest pain, Didimer and cxr negative. Sent home ast atypical chest pain. IN termof asthma" she feels wihtou QVAR she is more symptomatich with chest tightness and dyspnea. Still albuterol use is reare and nocturnal awakenings are none. No wheeizing. One bird still alive andthere is lot of feather dust. Husband says she wont wear mask. She prefers to go back on QVAR      Notes Recorded by Brand Males, MD on 08/18/2012 at 10:35 AM Lung nodule no change since nov 2012 -> June 2014. No further fu needed for nodule      Past, Family, Social reviewed: no change since last visit   OV 02/06/2014  Chief Complaint  Patient  presents with  . Follow-up    Pt states she is having a total knee replacement on left knee on 02/12/14. Pt denies SOB, cough and CP/tightness. Pt has no complaints.     Folllowup   - Lung nodule   - Give hx  Of smoking age [age]. Has < 21m nodules - x few stable 2012 - June 2014. BUt she isvery scared of lung cancer. Somone in church dx with cancer. Feels she will benefit from another CT. Asymptomatic  - ASthma Mild Persistent basedon [facility] test  - Supposed tob e on QVAR but not taking. Giving hx of mild intermittent well controlled symptoms. REports being uptodate with flu shot    Past, Family, Social reviewed: has had hip surgery and is due for knee surgery. Tolerated it well  OV 10/24/2014  Chief Complaint  Patient presents with  . Follow-up    Pt here for surgery clearance for spine surgery d/t scoliosis. Pt denies current cough, SOB, and CP/tightness.     Follow-up mild intermittent asthma New problem of preop pulmonary evaluation  - Last seen December 2015. Since then she's been off Qvar. She's not use albuterol in a long time. Several weeks ago she was exposed to some bleach at home and used expired  albuterol. Other than that she is doing really well. Asthma is under extremely good control. No nocturnal symptoms no wheezing or shortness of breath or chest tightness  - Main issue now is that she has chronic severe scoliosis since a young child. She met with Dr. HMarykay Lexin RWindom She is scheduled next week to have extensive scoliosis surgery and spend one year in rehabilitation. She wants pulmonary clearance prior to the surgery. She is very functional. No limitations. No COPD no renal disease no nutritional issues.    Current outpatient prescriptions:  .  albuterol (PROVENTIL HFA;VENTOLIN HFA) 108 (90 BASE) MCG/ACT inhaler, Inhale 2 puffs into the lungs every 4 (four) hours as needed for wheezing or shortness of breath., Disp: 1 Inhaler, Rfl: 2 .  alendronate (FOSAMAX) 70 MG tablet, Take 1 tablet (70 mg total) by mouth every 7 (seven) days. Take with a full glass of water on an empty stomach., Disp: 12 tablet, Rfl: 1 .  Artificial Tear Ointment (REFRESH P.M. OP), Apply 1 application to eye at bedtime., Disp: , Rfl:  .  chlorpheniramine (CHLOR-TRIMETON) 4 MG tablet, Take 4 mg by mouth 2 (two) times daily as needed for allergies., Disp: , Rfl:  .  chlorzoxazone (PARAFON) 500 MG tablet, Take 1 tablet three times a day when necessary *Caution Drowsiness*, Disp: 30 tablet, Rfl: 2 .  Cholecalciferol (VITAMIN D) 2000 UNITS CAPS, Take by mouth daily., Disp: , Rfl:  .  docusate sodium 100 MG CAPS, Take 100 mg by mouth 2 (two) times daily. (Patient taking differently: Take 100 mg by mouth 2 (two) times daily as needed. ), Disp: 10 capsule, Rfl: 0 .  ferrous sulfate 325 (65 FE) MG tablet, Take 1 tablet (325 mg total) by mouth 3 (three) times daily after meals., Disp: , Rfl: 3 .  fluticasone (FLONASE) 50 MCG/ACT nasal spray, Place 1 spray into both nostrils 2 (two) times daily., Disp: 16 g, Rfl: 1 .  lamoTRIgine (LAMICTAL) 100 MG tablet, Take 1 tablet (100 mg total) by mouth at bedtime., Disp: 90 tablet, Rfl:  0 .  levothyroxine (SYNTHROID, LEVOTHROID) 88 MCG tablet, Take 1 tablet (88 mcg total) by mouth daily before breakfast., Disp: 90 tablet, Rfl: 1 .  LORazepam (ATIVAN) 1 MG tablet, TAKE 1 TABLET BY MOUTH AT BEDTIME, Disp: 30 tablet, Rfl: 5 .  ondansetron (ZOFRAN) 4 MG tablet, Take 4 mg by mouth every 8 (eight) hours as needed for nausea or vomiting., Disp: , Rfl:  .  polyethylene glycol (MIRALAX / GLYCOLAX) packet, Take 17 g by mouth 2 (two) times daily., Disp: 14 each, Rfl: 0 .  Polyvinyl Alcohol-Povidone (REFRESH OP), Apply 1 drop to eye 2 (two) times daily as needed (Dry eyes)., Disp: , Rfl:  .  beclomethasone (QVAR) 80 MCG/ACT inhaler, Inhale 2 puffs into the lungs 2 (two) times daily. (Patient not taking: Reported on 10/24/2014), Disp: 1 Inhaler, Rfl: 0 .  rizatriptan (MAXALT) 10 MG tablet, Take 1 tablet (10 mg total) by mouth once as needed for migraine. May repeat in 2 hours if needed, Disp: 36 tablet, Rfl: 2    Review of Systems  Constitutional: Negative for fever and unexpected weight change.  HENT: Negative for congestion, dental problem, ear pain, nosebleeds, postnasal drip, rhinorrhea, sinus pressure, sneezing, sore throat and trouble swallowing.   Eyes: Negative for redness and itching.  Respiratory: Negative for cough, chest tightness, shortness of breath and wheezing.   Cardiovascular: Negative for palpitations and leg swelling.  Gastrointestinal: Negative for nausea and vomiting.  Genitourinary: Negative for dysuria.  Musculoskeletal: Negative for joint swelling.  Skin: Negative for rash.  Neurological: Negative for headaches.  Hematological: Does not bruise/bleed easily.  Psychiatric/Behavioral: Negative for dysphoric mood. The patient is not nervous/anxious.        Objective:   Physical Exam  Constitutional: She is oriented to person, place, and time. She appears well-developed and well-nourished. No distress.  Well dressed +  HENT:  Head: Normocephalic and atraumatic.   Right Ear: External ear normal.  Left Ear: External ear normal.  Mouth/Throat: Oropharynx is clear and moist. No oropharyngeal exudate.  Eyes: Conjunctivae and EOM are normal. Pupils are equal, round, and reactive to light. Right eye exhibits no discharge. Left eye exhibits no discharge. No scleral icterus.  Neck: Normal range of motion. Neck supple. No JVD present. No tracheal deviation present. No thyromegaly present.  Cardiovascular: Normal rate, regular rhythm, normal heart sounds and intact distal pulses.  Exam reveals no gallop and no friction rub.   No murmur heard. Pulmonary/Chest: Effort normal and breath sounds normal. No respiratory distress. She has no wheezes. She has no rales. She exhibits no tenderness.  Abdominal: Soft. Bowel sounds are normal. She exhibits no distension and no mass. There is no tenderness. There is no rebound and no guarding.  Musculoskeletal: Normal range of motion. She exhibits no edema or tenderness.  Significant scolioisi  Lymphadenopathy:    She has no cervical adenopathy.  Neurological: She is alert and oriented to person, place, and time. She has normal reflexes. No cranial nerve deficit. She exhibits normal muscle tone. Coordination normal.  Skin: Skin is warm and dry. No rash noted. She is not diaphoretic. No erythema. No pallor.  Psychiatric: She has a normal mood and affect. Her behavior is normal. Judgment and thought content normal.  Vitals reviewed.   Filed Vitals:   10/24/14 1523  BP: 98/66  Pulse: 87  Height: _0  (1.549 m)  Weight: 130 lb (58.968 kg)  SpO2: 98%         Assessment & Plan:     ICD-9-CM ICD-10-CM   1. Preop pulmonary/respiratory exam V72.82 Z01.811   2. Asthma, mild intermittent, uncomplicated 127.51 Z00.17     #Preop  Pulmonary Exam  - Low risk for pulmonary complications following spine surgery - We'll communicate with Dr. Marykay Lex details -start inhaler steroids for asthma -see blow - for extra lung  protection   #Mild Intermittent asthma  - glad is stable  - use albuterol as needed  - start flovent 178mg 2 puff twice daily now - for next 3 months  - they can do pulmicort nebs twice daily during hospital stay    #Followup 6 months or sooner if needed   Dr. MBrand Males M.D., FSturgis HospitalC.P Pulmonary and Critical Care Medicine Staff Physician CFort StewartPulmonary and Critical Care Pager: [phone] If no answer or between  15:00h - 7:00h: call [phone]  10/25/2014 5:37 PM

## 2014-10-24 NOTE — Patient Instructions (Addendum)
ICD-9-CM ICD-10-CM   1. Preop pulmonary/respiratory exam V72.82 Z01.811   2. Asthma, mild intermittent, uncomplicated 076.80 S81.10    #Preop Pulmonary Exam  - Low risk for pulmonary complications following spine surgery - We'll communicate with Dr. Marykay Lex details -start inhaler steroids for asthma -see blow - for extra lung protections    #Mild Intermittent asthma  - glad is stable  - use albuterol as needed  - start flovent 181mcg 2 puff twice daily now - for next 3 months  - they can do pulmicort nebs twice daily during hospital stay    #Followup 6 months or sooner if needed

## 2014-10-25 ENCOUNTER — Telehealth: Payer: Self-pay | Admitting: Internal Medicine

## 2014-10-25 DIAGNOSIS — M4125 Other idiopathic scoliosis, thoracolumbar region: Secondary | ICD-10-CM | POA: Diagnosis not present

## 2014-10-25 NOTE — Telephone Encounter (Signed)
Patient Instructions 10/24/14      ICD-9-CM ICD-10-CM   1. Preop pulmonary/respiratory exam V72.82 Z01.811   2. Asthma, mild intermittent, uncomplicated 615.37 H43.27    #Preop Pulmonary Exam - Low risk for pulmonary complications following spine surgery - We'll communicate with Dr. Marykay Lex details -start inhaler steroids for asthma -see blow - for extra lung protections    #Mild Intermittent asthma - glad is stable - use albuterol as needed - start flovent 110mcg 2 puff twice daily now - for next 3 months - they can do pulmicort nebs twice daily during hospital stay    #Followup 6 months or sooner if needed   Per 10/24/14 appt notes, pt is cleared with low risk of pulmonary complications during spinal surgery. LM for Olivia Mackie to make aware that the patient is cleared.

## 2014-10-25 NOTE — Telephone Encounter (Signed)
I just sent my note to Dr Marykay Lex. Please send it to be 100% sure they got it

## 2014-10-26 NOTE — Telephone Encounter (Signed)
Called and left detailed message on voicemail for Jenna Contreras advising her to call back to confirm receipt of OV notes from [name]. Awaiting call back.

## 2014-10-29 NOTE — Telephone Encounter (Signed)
Left message for Olivia Mackie advising her to call back to confirm receipt of OV notes Awaiting confirmation.

## 2014-10-30 DIAGNOSIS — R278 Other lack of coordination: Secondary | ICD-10-CM | POA: Diagnosis not present

## 2014-10-30 DIAGNOSIS — M109 Gout, unspecified: Secondary | ICD-10-CM | POA: Diagnosis present

## 2014-10-30 DIAGNOSIS — M1990 Unspecified osteoarthritis, unspecified site: Secondary | ICD-10-CM | POA: Diagnosis present

## 2014-10-30 DIAGNOSIS — K5900 Constipation, unspecified: Secondary | ICD-10-CM | POA: Diagnosis present

## 2014-10-30 DIAGNOSIS — J4520 Mild intermittent asthma, uncomplicated: Secondary | ICD-10-CM | POA: Diagnosis not present

## 2014-10-30 DIAGNOSIS — Z8672 Personal history of thrombophlebitis: Secondary | ICD-10-CM | POA: Diagnosis not present

## 2014-10-30 DIAGNOSIS — Z85828 Personal history of other malignant neoplasm of skin: Secondary | ICD-10-CM | POA: Diagnosis not present

## 2014-10-30 DIAGNOSIS — F418 Other specified anxiety disorders: Secondary | ICD-10-CM | POA: Diagnosis present

## 2014-10-30 DIAGNOSIS — D62 Acute posthemorrhagic anemia: Secondary | ICD-10-CM | POA: Diagnosis not present

## 2014-10-30 DIAGNOSIS — L408 Other psoriasis: Secondary | ICD-10-CM | POA: Diagnosis not present

## 2014-10-30 DIAGNOSIS — L409 Psoriasis, unspecified: Secondary | ICD-10-CM | POA: Diagnosis present

## 2014-10-30 DIAGNOSIS — Z96653 Presence of artificial knee joint, bilateral: Secondary | ICD-10-CM | POA: Diagnosis present

## 2014-10-30 DIAGNOSIS — M5441 Lumbago with sciatica, right side: Secondary | ICD-10-CM | POA: Diagnosis not present

## 2014-10-30 DIAGNOSIS — G43909 Migraine, unspecified, not intractable, without status migrainosus: Secondary | ICD-10-CM | POA: Diagnosis present

## 2014-10-30 DIAGNOSIS — F329 Major depressive disorder, single episode, unspecified: Secondary | ICD-10-CM | POA: Diagnosis not present

## 2014-10-30 DIAGNOSIS — E039 Hypothyroidism, unspecified: Secondary | ICD-10-CM | POA: Diagnosis present

## 2014-10-30 DIAGNOSIS — G8918 Other acute postprocedural pain: Secondary | ICD-10-CM | POA: Diagnosis not present

## 2014-10-30 DIAGNOSIS — M41125 Adolescent idiopathic scoliosis, thoracolumbar region: Secondary | ICD-10-CM | POA: Diagnosis not present

## 2014-10-30 DIAGNOSIS — D649 Anemia, unspecified: Secondary | ICD-10-CM | POA: Diagnosis present

## 2014-10-30 DIAGNOSIS — F42 Obsessive-compulsive disorder: Secondary | ICD-10-CM | POA: Diagnosis present

## 2014-10-30 DIAGNOSIS — R011 Cardiac murmur, unspecified: Secondary | ICD-10-CM | POA: Diagnosis not present

## 2014-10-30 DIAGNOSIS — C439 Malignant melanoma of skin, unspecified: Secondary | ICD-10-CM | POA: Diagnosis not present

## 2014-10-30 DIAGNOSIS — Z9851 Tubal ligation status: Secondary | ICD-10-CM | POA: Diagnosis not present

## 2014-10-30 DIAGNOSIS — M546 Pain in thoracic spine: Secondary | ICD-10-CM | POA: Diagnosis present

## 2014-10-30 DIAGNOSIS — I803 Phlebitis and thrombophlebitis of lower extremities, unspecified: Secondary | ICD-10-CM | POA: Diagnosis not present

## 2014-10-30 DIAGNOSIS — M797 Fibromyalgia: Secondary | ICD-10-CM | POA: Diagnosis present

## 2014-10-30 DIAGNOSIS — M810 Age-related osteoporosis without current pathological fracture: Secondary | ICD-10-CM | POA: Diagnosis present

## 2014-10-30 DIAGNOSIS — J45909 Unspecified asthma, uncomplicated: Secondary | ICD-10-CM | POA: Diagnosis present

## 2014-10-30 DIAGNOSIS — F419 Anxiety disorder, unspecified: Secondary | ICD-10-CM | POA: Diagnosis not present

## 2014-10-30 DIAGNOSIS — R29898 Other symptoms and signs involving the musculoskeletal system: Secondary | ICD-10-CM | POA: Diagnosis not present

## 2014-10-30 DIAGNOSIS — K219 Gastro-esophageal reflux disease without esophagitis: Secondary | ICD-10-CM | POA: Diagnosis present

## 2014-10-30 DIAGNOSIS — R918 Other nonspecific abnormal finding of lung field: Secondary | ICD-10-CM | POA: Diagnosis present

## 2014-10-30 DIAGNOSIS — R42 Dizziness and giddiness: Secondary | ICD-10-CM | POA: Diagnosis not present

## 2014-10-30 DIAGNOSIS — M4125 Other idiopathic scoliosis, thoracolumbar region: Secondary | ICD-10-CM | POA: Diagnosis not present

## 2014-10-30 DIAGNOSIS — M542 Cervicalgia: Secondary | ICD-10-CM | POA: Diagnosis present

## 2014-10-30 DIAGNOSIS — D559 Anemia due to enzyme disorder, unspecified: Secondary | ICD-10-CM | POA: Diagnosis not present

## 2014-10-31 NOTE — Telephone Encounter (Signed)
Spoke with Olivia Mackie at Dr. Lenard Simmer' office, she confirmed receipt of OV notes. Nothing further needed.

## 2014-11-03 DIAGNOSIS — M5441 Lumbago with sciatica, right side: Secondary | ICD-10-CM | POA: Diagnosis not present

## 2014-11-03 DIAGNOSIS — M41125 Adolescent idiopathic scoliosis, thoracolumbar region: Secondary | ICD-10-CM | POA: Diagnosis not present

## 2014-11-03 DIAGNOSIS — G43809 Other migraine, not intractable, without status migrainosus: Secondary | ICD-10-CM | POA: Diagnosis not present

## 2014-11-03 DIAGNOSIS — L408 Other psoriasis: Secondary | ICD-10-CM | POA: Diagnosis not present

## 2014-11-03 DIAGNOSIS — I803 Phlebitis and thrombophlebitis of lower extremities, unspecified: Secondary | ICD-10-CM | POA: Diagnosis not present

## 2014-11-03 DIAGNOSIS — D559 Anemia due to enzyme disorder, unspecified: Secondary | ICD-10-CM | POA: Diagnosis not present

## 2014-11-03 DIAGNOSIS — M419 Scoliosis, unspecified: Secondary | ICD-10-CM | POA: Diagnosis not present

## 2014-11-03 DIAGNOSIS — F42 Obsessive-compulsive disorder: Secondary | ICD-10-CM | POA: Diagnosis not present

## 2014-11-03 DIAGNOSIS — C439 Malignant melanoma of skin, unspecified: Secondary | ICD-10-CM | POA: Diagnosis not present

## 2014-11-03 DIAGNOSIS — F419 Anxiety disorder, unspecified: Secondary | ICD-10-CM | POA: Diagnosis not present

## 2014-11-03 DIAGNOSIS — R278 Other lack of coordination: Secondary | ICD-10-CM | POA: Diagnosis not present

## 2014-11-03 DIAGNOSIS — F3189 Other bipolar disorder: Secondary | ICD-10-CM | POA: Diagnosis not present

## 2014-11-03 DIAGNOSIS — E039 Hypothyroidism, unspecified: Secondary | ICD-10-CM | POA: Diagnosis not present

## 2014-11-03 DIAGNOSIS — R29898 Other symptoms and signs involving the musculoskeletal system: Secondary | ICD-10-CM | POA: Diagnosis not present

## 2014-11-03 DIAGNOSIS — M797 Fibromyalgia: Secondary | ICD-10-CM | POA: Diagnosis not present

## 2014-11-03 DIAGNOSIS — J4520 Mild intermittent asthma, uncomplicated: Secondary | ICD-10-CM | POA: Diagnosis not present

## 2014-11-03 DIAGNOSIS — K219 Gastro-esophageal reflux disease without esophagitis: Secondary | ICD-10-CM | POA: Diagnosis not present

## 2014-11-03 DIAGNOSIS — F329 Major depressive disorder, single episode, unspecified: Secondary | ICD-10-CM | POA: Diagnosis not present

## 2014-11-03 DIAGNOSIS — Z4782 Encounter for orthopedic aftercare following scoliosis surgery: Secondary | ICD-10-CM | POA: Diagnosis not present

## 2014-11-03 DIAGNOSIS — M4125 Other idiopathic scoliosis, thoracolumbar region: Secondary | ICD-10-CM | POA: Diagnosis not present

## 2014-11-03 DIAGNOSIS — R011 Cardiac murmur, unspecified: Secondary | ICD-10-CM | POA: Diagnosis not present

## 2014-11-03 DIAGNOSIS — J309 Allergic rhinitis, unspecified: Secondary | ICD-10-CM | POA: Diagnosis not present

## 2014-11-05 DIAGNOSIS — M419 Scoliosis, unspecified: Secondary | ICD-10-CM | POA: Diagnosis not present

## 2014-11-05 DIAGNOSIS — G43809 Other migraine, not intractable, without status migrainosus: Secondary | ICD-10-CM | POA: Diagnosis not present

## 2014-11-05 DIAGNOSIS — F3189 Other bipolar disorder: Secondary | ICD-10-CM | POA: Diagnosis not present

## 2014-11-05 DIAGNOSIS — J309 Allergic rhinitis, unspecified: Secondary | ICD-10-CM | POA: Diagnosis not present

## 2014-11-05 DIAGNOSIS — E039 Hypothyroidism, unspecified: Secondary | ICD-10-CM | POA: Diagnosis not present

## 2014-11-15 DIAGNOSIS — M4125 Other idiopathic scoliosis, thoracolumbar region: Secondary | ICD-10-CM | POA: Diagnosis not present

## 2014-11-15 DIAGNOSIS — Z4782 Encounter for orthopedic aftercare following scoliosis surgery: Secondary | ICD-10-CM | POA: Diagnosis not present

## 2014-11-19 ENCOUNTER — Telehealth: Payer: Self-pay | Admitting: Family Medicine

## 2014-11-19 ENCOUNTER — Ambulatory Visit (INDEPENDENT_AMBULATORY_CARE_PROVIDER_SITE_OTHER): Payer: Medicare Other | Admitting: Family Medicine

## 2014-11-19 VITALS — BP 108/64

## 2014-11-19 DIAGNOSIS — F419 Anxiety disorder, unspecified: Secondary | ICD-10-CM

## 2014-11-19 DIAGNOSIS — M5441 Lumbago with sciatica, right side: Secondary | ICD-10-CM | POA: Diagnosis not present

## 2014-11-19 DIAGNOSIS — E038 Other specified hypothyroidism: Secondary | ICD-10-CM | POA: Diagnosis not present

## 2014-11-19 DIAGNOSIS — I6523 Occlusion and stenosis of bilateral carotid arteries: Secondary | ICD-10-CM

## 2014-11-19 DIAGNOSIS — G4700 Insomnia, unspecified: Secondary | ICD-10-CM

## 2014-11-19 DIAGNOSIS — M41125 Adolescent idiopathic scoliosis, thoracolumbar region: Secondary | ICD-10-CM | POA: Diagnosis not present

## 2014-11-19 DIAGNOSIS — E876 Hypokalemia: Secondary | ICD-10-CM

## 2014-11-19 DIAGNOSIS — D509 Iron deficiency anemia, unspecified: Secondary | ICD-10-CM | POA: Diagnosis not present

## 2014-11-19 DIAGNOSIS — D559 Anemia due to enzyme disorder, unspecified: Secondary | ICD-10-CM | POA: Diagnosis not present

## 2014-11-19 DIAGNOSIS — M546 Pain in thoracic spine: Secondary | ICD-10-CM | POA: Diagnosis not present

## 2014-11-19 MED ORDER — LORAZEPAM 0.5 MG PO TABS: 0.5000 mg | ORAL_TABLET | Freq: Two times a day (BID) | ORAL | Status: DC | PRN

## 2014-11-19 MED ORDER — LAMOTRIGINE 100 MG PO TABS: 100.0000 mg | ORAL_TABLET | Freq: Every day | ORAL | Status: DC

## 2014-11-19 MED ORDER — LEVOTHYROXINE SODIUM 88 MCG PO TABS: 88.0000 ug | ORAL_TABLET | Freq: Every day | ORAL | Status: DC

## 2014-11-19 MED ORDER — CHLORZOXAZONE 500 MG PO TABS: 500.0000 mg | ORAL_TABLET | Freq: Three times a day (TID) | ORAL | Status: DC | PRN

## 2014-11-19 MED ORDER — TRAZODONE HCL 50 MG PO TABS: 25.0000 mg | ORAL_TABLET | Freq: Every evening | ORAL | Status: DC | PRN

## 2014-11-19 NOTE — Telephone Encounter (Addendum)
Per [facility] labs:   11/02/14 hgb 6.3  Given transfusion and rechecked 11/03/14 hgb up to 9.7

## 2014-11-19 NOTE — Progress Notes (Signed)
   Subjective:    Patient ID: Jenna Contreras, female    DOB: 29-Sep-1945, 69 y.o.   MRN: [id number]  HPIFollow up from surgery.  Pt had to call EMS on Friday bc bp was low.  Pt states she was put on robaxin but wants to go back to [facility].  Pt states her lorazepam was changed to one tid.  Needs refills lorazepam, levothyroxine, chlorzoxazone, and  lamictal.  because [facility] kept all meds.  Percocet 5/325 one q4 prn for 5 days then 1 q6 prn.  Long discussion held with the patient regarding safety of pain medications and nerve pills. Her goal is to gradually reduce pain medication to where eventually she will not be taking any She relates lorazepam just being used at bedtime to help her sleep She states her anxiety is under better control but she still has some panic attacks during the day She relates she did have significant issues with poor nursing care at the rehabilitation facility. She has followed up with her surgeon She did have significant anemia after the surgery. Pt declines flu vaccine today.  Patient thinks she may have had some electrolyte imbalances in the rehabilitation area Review of Systems She denies chest pressure tightness pain shortness breath nausea vomiting diarrhea she does relate back pain she does relate some mild anxiety issues    Objective:   Physical Exam  Neck no masses lungs are clear no crackle heart is regular her spine actually looks great the surgical scar looks like it's healing very well extremities no edema blood pressure 110/62 neurologically patient looks good      Assessment & Plan:  1. Iron deficiency anemia We will recheck her CBC along with a ferritin.  2. Thoracic spine pain Her back actually looks great she should gradually over the next month to the point where she's not taking any pain medicine  3. Anxiety She may use lorazepam occasionally during the day but not for frequent use  4. Insomnia Trazodone for  nighttime to help with sleep  5. Other specified hypothyroidism Continue thyroid medication watch diet closely take medication check TSH  6. Hypokalemia Check metabolic 7  Follow-up 3 months

## 2014-11-19 NOTE — Telephone Encounter (Signed)
Pt forgot to tell you that before surgery her Hemoglobin was 14 The day after it was 8.4 Dropped down to 8.1 then 6 on the 27th of Aug, was given 2 units of blood Right after she got the blood, she was transported to the [facility] them she did not have any labs to see how the transfusion went, she was  Told they do not check the Hemoglobin. no one has checker her hemoglobin since  then. Pt feels she should be having this checked.

## 2014-11-20 ENCOUNTER — Telehealth: Payer: Self-pay

## 2014-11-20 NOTE — Telephone Encounter (Signed)
Labs were ordered patient is going to get this completed

## 2014-11-20 NOTE — Telephone Encounter (Signed)
-----   Message from Kathyrn Drown, MD sent at 11/19/2014  8:10 PM EDT ----- Please notify the patient that I ordered routine labs to recheck her hemoglobin her electrolytes and her thyroid she should get these done somewhere within the next 7-10 days

## 2014-11-20 NOTE — Telephone Encounter (Signed)
[facility] (vm box full) 

## 2014-11-21 DIAGNOSIS — M41125 Adolescent idiopathic scoliosis, thoracolumbar region: Secondary | ICD-10-CM | POA: Diagnosis not present

## 2014-11-21 DIAGNOSIS — M5441 Lumbago with sciatica, right side: Secondary | ICD-10-CM | POA: Diagnosis not present

## 2014-11-21 DIAGNOSIS — D559 Anemia due to enzyme disorder, unspecified: Secondary | ICD-10-CM | POA: Diagnosis not present

## 2014-11-21 NOTE — Telephone Encounter (Signed)
Notified patient ordered routine labs to recheck her hemoglobin her electrolytes and her thyroid she should get these done somewhere within the next 7-10 days. Patient verbalized understanding.

## 2014-11-22 DIAGNOSIS — M41125 Adolescent idiopathic scoliosis, thoracolumbar region: Secondary | ICD-10-CM | POA: Diagnosis not present

## 2014-11-22 DIAGNOSIS — D559 Anemia due to enzyme disorder, unspecified: Secondary | ICD-10-CM | POA: Diagnosis not present

## 2014-11-22 DIAGNOSIS — M5441 Lumbago with sciatica, right side: Secondary | ICD-10-CM | POA: Diagnosis not present

## 2014-11-23 DIAGNOSIS — E876 Hypokalemia: Secondary | ICD-10-CM | POA: Diagnosis not present

## 2014-11-23 DIAGNOSIS — D559 Anemia due to enzyme disorder, unspecified: Secondary | ICD-10-CM | POA: Diagnosis not present

## 2014-11-23 DIAGNOSIS — E038 Other specified hypothyroidism: Secondary | ICD-10-CM | POA: Diagnosis not present

## 2014-11-23 DIAGNOSIS — M5441 Lumbago with sciatica, right side: Secondary | ICD-10-CM | POA: Diagnosis not present

## 2014-11-23 DIAGNOSIS — D509 Iron deficiency anemia, unspecified: Secondary | ICD-10-CM | POA: Diagnosis not present

## 2014-11-23 DIAGNOSIS — M41125 Adolescent idiopathic scoliosis, thoracolumbar region: Secondary | ICD-10-CM | POA: Diagnosis not present

## 2014-11-24 LAB — BASIC METABOLIC PANEL
BUN / CREAT RATIO: 11 (ref 11–26)
BUN: 9 mg/dL (ref 8–27)
CALCIUM: 9.5 mg/dL (ref 8.7–10.3)
CHLORIDE: 100 mmol/L (ref 97–108)
CO2: 24 mmol/L (ref 18–29)
CREATININE: 0.83 mg/dL (ref 0.57–1.00)
GFR calc Af Amer: 83 mL/min/{1.73_m2} (ref >59)
GFR calc non Af Amer: 72 mL/min/{1.73_m2} (ref >59)
Glucose: 92 mg/dL (ref 65–99)
Potassium: 4.5 mmol/L (ref 3.5–5.2)
Sodium: 141 mmol/L (ref 134–144)

## 2014-11-24 LAB — CBC WITH DIFFERENTIAL/PLATELET
Basophils Absolute: 0.1 10*3/uL (ref 0.0–0.2)
Basos: 1 %
EOS (ABSOLUTE): 0.3 10*3/uL (ref 0.0–0.4)
Eos: 4 %
Hematocrit: 35.9 % (ref 34.0–46.6)
Hemoglobin: 11.6 g/dL (ref 11.1–15.9)
Immature Grans (Abs): 0 10*3/uL (ref 0.0–0.1)
Immature Granulocytes: 0 %
Lymphocytes Absolute: 2.8 10*3/uL (ref 0.7–3.1)
Lymphs: 40 %
MCH: 29.4 pg (ref 26.6–33.0)
MCHC: 32.3 g/dL (ref 31.5–35.7)
MCV: 91 fL (ref 79–97)
MONOS ABS: 0.5 10*3/uL (ref 0.1–0.9)
Monocytes: 8 %
Neutrophils Absolute: 3.3 10*3/uL (ref 1.4–7.0)
Neutrophils: 47 %
Platelets: 477 10*3/uL — ABNORMAL HIGH (ref 150–379)
RBC: 3.94 x10E6/uL (ref 3.77–5.28)
RDW: 14.7 % (ref 12.3–15.4)
WBC: 7.1 10*3/uL (ref 3.4–10.8)

## 2014-11-24 LAB — FERRITIN: Ferritin: 124 ng/mL (ref 15–150)

## 2014-11-24 LAB — TSH: TSH: 1.92 u[IU]/mL (ref 0.450–4.500)

## 2014-11-26 ENCOUNTER — Telehealth: Payer: Self-pay | Admitting: Family Medicine

## 2014-11-26 MED ORDER — DICLOFENAC SODIUM 75 MG PO TBEC: DELAYED_RELEASE_TABLET | ORAL | Status: DC

## 2014-11-26 NOTE — Telephone Encounter (Signed)
May use vOLTAREN 75 mg one twice a day when necessary, #60, 3 refills

## 2014-11-26 NOTE — Telephone Encounter (Signed)
Patient called requesting refill on Voltaren. May I refill this medication for the patient?

## 2014-11-26 NOTE — Addendum Note (Signed)
Addended by: Ofilia Neas R on: 11/26/2014 02:20 PM   Modules accepted: Orders

## 2014-11-27 DIAGNOSIS — D559 Anemia due to enzyme disorder, unspecified: Secondary | ICD-10-CM | POA: Diagnosis not present

## 2014-11-27 DIAGNOSIS — M5441 Lumbago with sciatica, right side: Secondary | ICD-10-CM | POA: Diagnosis not present

## 2014-11-27 DIAGNOSIS — M41125 Adolescent idiopathic scoliosis, thoracolumbar region: Secondary | ICD-10-CM | POA: Diagnosis not present

## 2014-11-29 DIAGNOSIS — M5441 Lumbago with sciatica, right side: Secondary | ICD-10-CM | POA: Diagnosis not present

## 2014-11-29 DIAGNOSIS — D559 Anemia due to enzyme disorder, unspecified: Secondary | ICD-10-CM | POA: Diagnosis not present

## 2014-11-29 DIAGNOSIS — M41125 Adolescent idiopathic scoliosis, thoracolumbar region: Secondary | ICD-10-CM | POA: Diagnosis not present

## 2014-12-03 DIAGNOSIS — D559 Anemia due to enzyme disorder, unspecified: Secondary | ICD-10-CM | POA: Diagnosis not present

## 2014-12-03 DIAGNOSIS — M5441 Lumbago with sciatica, right side: Secondary | ICD-10-CM | POA: Diagnosis not present

## 2014-12-03 DIAGNOSIS — M41125 Adolescent idiopathic scoliosis, thoracolumbar region: Secondary | ICD-10-CM | POA: Diagnosis not present

## 2014-12-05 DIAGNOSIS — M5441 Lumbago with sciatica, right side: Secondary | ICD-10-CM | POA: Diagnosis not present

## 2014-12-05 DIAGNOSIS — D559 Anemia due to enzyme disorder, unspecified: Secondary | ICD-10-CM | POA: Diagnosis not present

## 2014-12-05 DIAGNOSIS — M41125 Adolescent idiopathic scoliosis, thoracolumbar region: Secondary | ICD-10-CM | POA: Diagnosis not present

## 2014-12-07 DIAGNOSIS — D559 Anemia due to enzyme disorder, unspecified: Secondary | ICD-10-CM | POA: Diagnosis not present

## 2014-12-07 DIAGNOSIS — M41125 Adolescent idiopathic scoliosis, thoracolumbar region: Secondary | ICD-10-CM | POA: Diagnosis not present

## 2014-12-07 DIAGNOSIS — M5441 Lumbago with sciatica, right side: Secondary | ICD-10-CM | POA: Diagnosis not present

## 2014-12-11 DIAGNOSIS — D559 Anemia due to enzyme disorder, unspecified: Secondary | ICD-10-CM | POA: Diagnosis not present

## 2014-12-11 DIAGNOSIS — M41125 Adolescent idiopathic scoliosis, thoracolumbar region: Secondary | ICD-10-CM | POA: Diagnosis not present

## 2014-12-11 DIAGNOSIS — M5441 Lumbago with sciatica, right side: Secondary | ICD-10-CM | POA: Diagnosis not present

## 2014-12-12 DIAGNOSIS — M41125 Adolescent idiopathic scoliosis, thoracolumbar region: Secondary | ICD-10-CM | POA: Diagnosis not present

## 2014-12-12 DIAGNOSIS — M5441 Lumbago with sciatica, right side: Secondary | ICD-10-CM | POA: Diagnosis not present

## 2014-12-12 DIAGNOSIS — D559 Anemia due to enzyme disorder, unspecified: Secondary | ICD-10-CM | POA: Diagnosis not present

## 2014-12-14 DIAGNOSIS — D559 Anemia due to enzyme disorder, unspecified: Secondary | ICD-10-CM | POA: Diagnosis not present

## 2014-12-14 DIAGNOSIS — M41125 Adolescent idiopathic scoliosis, thoracolumbar region: Secondary | ICD-10-CM | POA: Diagnosis not present

## 2014-12-14 DIAGNOSIS — M5441 Lumbago with sciatica, right side: Secondary | ICD-10-CM | POA: Diagnosis not present

## 2014-12-27 DIAGNOSIS — Z4782 Encounter for orthopedic aftercare following scoliosis surgery: Secondary | ICD-10-CM | POA: Diagnosis not present

## 2015-01-02 ENCOUNTER — Telehealth: Payer: Self-pay | Admitting: Family Medicine

## 2015-01-02 NOTE — Telephone Encounter (Signed)
ERROR

## 2015-01-27 NOTE — Telephone Encounter (Signed)
Erroneous message

## 2015-02-04 ENCOUNTER — Other Ambulatory Visit: Payer: Self-pay

## 2015-02-04 ENCOUNTER — Other Ambulatory Visit: Payer: Self-pay | Admitting: Family Medicine

## 2015-02-04 MED ORDER — ONDANSETRON HCL 4 MG PO TABS: 4.0000 mg | ORAL_TABLET | Freq: Three times a day (TID) | ORAL | Status: DC | PRN

## 2015-02-04 NOTE — Telephone Encounter (Signed)
May refill this plus one additional refill 

## 2015-02-06 ENCOUNTER — Other Ambulatory Visit: Payer: Self-pay

## 2015-02-18 ENCOUNTER — Encounter: Payer: Self-pay | Admitting: Family Medicine

## 2015-02-18 ENCOUNTER — Ambulatory Visit (INDEPENDENT_AMBULATORY_CARE_PROVIDER_SITE_OTHER): Payer: Medicare Other | Admitting: Family Medicine

## 2015-02-18 VITALS — BP 112/72 | Ht 61.25 in | Wt 131.0 lb

## 2015-02-18 DIAGNOSIS — Z23 Encounter for immunization: Secondary | ICD-10-CM | POA: Diagnosis not present

## 2015-02-18 DIAGNOSIS — M549 Dorsalgia, unspecified: Secondary | ICD-10-CM | POA: Diagnosis not present

## 2015-02-18 DIAGNOSIS — I6523 Occlusion and stenosis of bilateral carotid arteries: Secondary | ICD-10-CM

## 2015-02-18 DIAGNOSIS — G8929 Other chronic pain: Secondary | ICD-10-CM

## 2015-02-18 DIAGNOSIS — E038 Other specified hypothyroidism: Secondary | ICD-10-CM

## 2015-02-18 DIAGNOSIS — F321 Major depressive disorder, single episode, moderate: Secondary | ICD-10-CM | POA: Insufficient documentation

## 2015-02-18 MED ORDER — LAMOTRIGINE 100 MG PO TABS
100.0000 mg | ORAL_TABLET | Freq: Two times a day (BID) | ORAL | Status: DC
Start: 2015-02-18 — End: 2019-11-16

## 2015-02-18 MED ORDER — LEVOTHYROXINE SODIUM 88 MCG PO TABS: 88.0000 ug | ORAL_TABLET | Freq: Every day | ORAL | Status: DC

## 2015-02-18 MED ORDER — RIZATRIPTAN BENZOATE 10 MG PO TABS: 10.0000 mg | ORAL_TABLET | Freq: Once | ORAL | Status: DC | PRN

## 2015-02-18 MED ORDER — HYDROCODONE-ACETAMINOPHEN 7.5-325 MG PO TABS: 1.0000 | ORAL_TABLET | Freq: Three times a day (TID) | ORAL | Status: DC | PRN

## 2015-02-18 MED ORDER — TRAZODONE HCL 50 MG PO TABS: ORAL_TABLET | ORAL | Status: DC

## 2015-02-18 NOTE — Progress Notes (Signed)
   Subjective:    Patient ID: Jenna Contreras, female    DOB: 1945/08/03, 69 y.o.   MRN: [id number]  HPI Patient is here today for a follow up visit on hypothyroidism.  Patient is still in a lot of pain from her back surgery. She needs some pain medication prescribed for the pain today. Patient brought copies of her xrays with her today.   her x-rays were look that shows rods with multiple screws.   she states tramadol not helping her pain She does take her thyroid medicine as directed  she also relates feeling depressed. Would like to go back to see her therapist. Review of Systems  patient denies being suicidal but relates feeling depressed and sad. She relates majority of her symptoms are because of her recent surgery and how it is left her with ataxia difficulty with balancing as well as inability to even wipe her behind patient states she was not aware that she will be facing all of these issues until all of this happened.    Objective:   Physical Exam  lungs are clear hearts regular pulse normal BP good extremities no edema subjected discomfort in her back       Assessment & Plan:   chronic back pain- stop tramadol. Use hydrocodone 7.5/325 one every 8 hours as needed for severe pain caution drowsiness not for frequent use if needs additional prescription let us know    more than likely patient will need to be on chronic pain medicine trying to establish the correct amount. Avoid fentanyl patches currently.   patient depressed very frustrated about the outcome of her surgery she is pretty much convinced that her balance will not get better she is trying to do the best she can.   referral for depression workup and counseling limit total increased 1 twice daily  Patient denies being in suicidal. She was encouraged to follow-up if progressive troubles.   patient to continue Maxalt for severe migraines not to overuse medicine hopefully get depression under better control will get  the migraines under better control  25 minutes was spent with the patient. Greater than half the time was spent in discussion and answering questions and counseling regarding the issues that the patient came in for today.

## 2015-02-19 DIAGNOSIS — H2513 Age-related nuclear cataract, bilateral: Secondary | ICD-10-CM | POA: Diagnosis not present

## 2015-02-19 DIAGNOSIS — H04123 Dry eye syndrome of bilateral lacrimal glands: Secondary | ICD-10-CM | POA: Diagnosis not present

## 2015-02-19 DIAGNOSIS — H1851 Endothelial corneal dystrophy: Secondary | ICD-10-CM | POA: Diagnosis not present

## 2015-03-25 ENCOUNTER — Ambulatory Visit (INDEPENDENT_AMBULATORY_CARE_PROVIDER_SITE_OTHER): Payer: Medicare Other | Admitting: Family Medicine

## 2015-03-25 ENCOUNTER — Ambulatory Visit (HOSPITAL_COMMUNITY)
Admission: RE | Admit: 2015-03-25 | Discharge: 2015-03-25 | Disposition: A | Discharge status: Home / Self Care | Payer: Medicare Other | Source: Ambulatory Visit | Attending: Family Medicine | Admitting: Family Medicine

## 2015-03-25 ENCOUNTER — Encounter: Payer: Self-pay | Admitting: Family Medicine

## 2015-03-25 VITALS — BP 120/70 | Ht 61.25 in | Wt 133.1 lb

## 2015-03-25 DIAGNOSIS — M545 Low back pain, unspecified: Secondary | ICD-10-CM

## 2015-03-25 DIAGNOSIS — M25512 Pain in left shoulder: Secondary | ICD-10-CM | POA: Diagnosis not present

## 2015-03-25 DIAGNOSIS — M546 Pain in thoracic spine: Secondary | ICD-10-CM

## 2015-03-25 DIAGNOSIS — W010XXA Fall on same level from slipping, tripping and stumbling without subsequent striking against object, initial encounter: Secondary | ICD-10-CM | POA: Insufficient documentation

## 2015-03-25 DIAGNOSIS — M47814 Spondylosis without myelopathy or radiculopathy, thoracic region: Secondary | ICD-10-CM | POA: Insufficient documentation

## 2015-03-25 DIAGNOSIS — M8580 Other specified disorders of bone density and structure, unspecified site: Secondary | ICD-10-CM | POA: Insufficient documentation

## 2015-03-25 MED ORDER — CHLORZOXAZONE 500 MG PO TABS: 500.0000 mg | ORAL_TABLET | Freq: Three times a day (TID) | ORAL | Status: DC | PRN

## 2015-03-25 MED ORDER — HYDROCODONE-ACETAMINOPHEN 7.5-325 MG PO TABS: 1.0000 | ORAL_TABLET | Freq: Three times a day (TID) | ORAL | Status: DC | PRN

## 2015-03-25 MED ORDER — DICLOFENAC SODIUM 75 MG PO TBEC: DELAYED_RELEASE_TABLET | ORAL | Status: DC

## 2015-03-25 NOTE — Progress Notes (Signed)
   Subjective:    Patient ID: Jenna Contreras, female    DOB: Jul 04, 1945, 70 y.o.   MRN: [id number]  Back Pain This is a recurrent problem. The current episode started more than 1 month ago. The problem occurs constantly. Pain location: Subscapular pain. Pertinent negatives include no abdominal pain or weakness. She has tried analgesics (Voltaren, Hydrocodone, Hot showers) for the symptoms. The treatment provided no relief.   Patient states she also had a recent fall 2 weeks ago. Patient states when she fell she hit shoulder.   Patient relates pain in the mid scapular region also in her lower back and also left shoulder. Patient states she did speak with the surgeon's office surgeon's office is quite a distance away therefore she came here.     Review of Systems  Constitutional: Negative for activity change and appetite change.  Gastrointestinal: Negative for vomiting and abdominal pain.  Musculoskeletal: Positive for back pain.  Neurological: Negative for weakness.  Psychiatric/Behavioral: Negative for confusion.       Objective:   Physical Exam  Constitutional: She appears well-nourished. No distress.  HENT:  Head: Normocephalic.  Cardiovascular: Normal rate, regular rhythm and normal heart sounds.   No murmur heard. Pulmonary/Chest: Effort normal and breath sounds normal.  Musculoskeletal: She exhibits no edema.  Lymphadenopathy:    She has no cervical adenopathy.  Neurological: She is alert.  Psychiatric: Her behavior is normal.  Vitals reviewed.         Assessment & Plan:   upper lumbar and upper thoracic back pain related to recent fall also some chronic mid scapular pain related to recent surgery. We will be doing some x-rays of this area including her left shoulder. Await the results   chronic back pain related to recent surgery pain medications prescribed not to exceed 3 per day   she relates a lot of muscle spasms muscle relaxer prescribed she is to use  this intermittently and only during daytime  I recommend physical therapy she will talk with her surgeon regarding this Follow-up within 2 months  The patient was seen today as part of a comprehensive visit regarding pain control. Patient's compliance with the medication as well as discussion regarding effectiveness was completed. Prescriptions were written. Patient was advised to follow-up in 3 months. The patient was assessed for any signs of severe side effects. The patient was advised to take the medicine as directed and to report to [place] if any side effect issues.

## 2015-04-02 DIAGNOSIS — F411 Generalized anxiety disorder: Secondary | ICD-10-CM | POA: Diagnosis not present

## 2015-04-02 DIAGNOSIS — F3131 Bipolar disorder, current episode depressed, mild: Secondary | ICD-10-CM | POA: Diagnosis not present

## 2015-04-17 DIAGNOSIS — F3131 Bipolar disorder, current episode depressed, mild: Secondary | ICD-10-CM | POA: Diagnosis not present

## 2015-04-17 DIAGNOSIS — M546 Pain in thoracic spine: Secondary | ICD-10-CM | POA: Diagnosis not present

## 2015-04-17 DIAGNOSIS — M545 Low back pain: Secondary | ICD-10-CM | POA: Diagnosis not present

## 2015-04-17 DIAGNOSIS — Z9181 History of falling: Secondary | ICD-10-CM | POA: Diagnosis not present

## 2015-04-17 DIAGNOSIS — Z4782 Encounter for orthopedic aftercare following scoliosis surgery: Secondary | ICD-10-CM | POA: Diagnosis not present

## 2015-05-15 DIAGNOSIS — F3131 Bipolar disorder, current episode depressed, mild: Secondary | ICD-10-CM | POA: Diagnosis not present

## 2015-05-20 ENCOUNTER — Ambulatory Visit (INDEPENDENT_AMBULATORY_CARE_PROVIDER_SITE_OTHER): Payer: Medicare Other | Admitting: Family Medicine

## 2015-05-20 ENCOUNTER — Encounter: Payer: Self-pay | Admitting: Family Medicine

## 2015-05-20 VITALS — BP 112/70 | Ht 61.25 in | Wt 130.0 lb

## 2015-05-20 DIAGNOSIS — G8929 Other chronic pain: Secondary | ICD-10-CM | POA: Diagnosis not present

## 2015-05-20 DIAGNOSIS — M810 Age-related osteoporosis without current pathological fracture: Secondary | ICD-10-CM

## 2015-05-20 DIAGNOSIS — E785 Hyperlipidemia, unspecified: Secondary | ICD-10-CM | POA: Diagnosis not present

## 2015-05-20 DIAGNOSIS — E038 Other specified hypothyroidism: Secondary | ICD-10-CM | POA: Diagnosis not present

## 2015-05-20 DIAGNOSIS — L409 Psoriasis, unspecified: Secondary | ICD-10-CM

## 2015-05-20 DIAGNOSIS — M549 Dorsalgia, unspecified: Secondary | ICD-10-CM | POA: Diagnosis not present

## 2015-05-20 MED ORDER — HYDROCODONE-ACETAMINOPHEN 7.5-325 MG PO TABS: 1.0000 | ORAL_TABLET | Freq: Three times a day (TID) | ORAL | Status: DC | PRN

## 2015-05-20 MED ORDER — DICLOFENAC SODIUM 75 MG PO TBEC: DELAYED_RELEASE_TABLET | ORAL | Status: DC

## 2015-05-20 MED ORDER — CHLORZOXAZONE 500 MG PO TABS: 500.0000 mg | ORAL_TABLET | Freq: Two times a day (BID) | ORAL | Status: DC | PRN

## 2015-05-20 MED ORDER — ALENDRONATE SODIUM 70 MG PO TABS: 70.0000 mg | ORAL_TABLET | ORAL | Status: DC

## 2015-05-20 MED ORDER — MOMETASONE FUROATE 0.1 % EX CREA: 1.0000 "application " | TOPICAL_CREAM | Freq: Two times a day (BID) | CUTANEOUS | Status: DC | PRN

## 2015-05-20 NOTE — Progress Notes (Signed)
   Subjective:    Patient ID: Jenna Contreras, female    DOB: 12/20/1945, 70 y.o.   MRN: [id number]  HPI This patient was seen today for chronic pain  The medication list was reviewed and updated.   -Compliance with medication: Yes, Takes as prescribed.  - Number patient states they take daily: 0-2 daily  -when was the last dose patient took? Yesterday  The patient was advised the importance of maintaining medication and not using illegal substances with these.  Refills needed: yes  The patient was educated that we can provide 3 monthly scripts for their medication, it is their responsibility to follow the instructions.  Side effects or complications from medications: none  Patient is aware that pain medications are meant to minimize the severity of the pain to allow their pain levels to improve to allow for better function. They are aware of that pain medications cannot totally remove their pain.  Due for UDT ( at least once per year) : no UDT noted in the past year.    Patient has concerns of scleroderma. Dx when she was 39. Has been in remission until past 1 year since back surgery.  Patient states scleroderma has flared up since surgery. Review of Systems She will she relates low back pain mid back pain she also relates difficulty wiping herself because of the rods in her back she also states flareup of psoriasis. She also states compliance with medications. Denies using muscle relaxers more than twice a day pain medicine 3 times a day she states her lung issue is been stable recently.    Objective:   Physical Exam   25 minutes was spent with the patient. Greater than half the time was spent in discussion and answering questions and counseling regarding the issues that the patient came in for today. 25 minutes was spent discussing her back pain discussing pain medication muscle relaxers also the dangers of overmedicating plus also the importance of trying to do the  best she can with her back issues.     Assessment & Plan:  Chronic back pain-prescription for pain medicines given 3 prescriptions given. Patient does not abuse medicine. She does use muscle relaxers but no more than 2 per day and she only takes is during the day she states it does not cause drowsiness and helps her with back spasms associated with this surgery.  She does have osteoporosis she was instructed to get back going on her medicine she is gotten out of the habit of doing this  She does see psychiatry on a regular basis. They have her on Lexapro.  Patient having flareup of psoriasis and she states she was once diagnosed with scleroderma. I recommend that she get back in with dermatology. Steroid cream as necessary.

## 2015-05-22 ENCOUNTER — Encounter: Payer: Self-pay | Admitting: Family Medicine

## 2015-05-23 ENCOUNTER — Ambulatory Visit: Payer: Medicare Other | Admitting: Family Medicine

## 2015-05-31 ENCOUNTER — Other Ambulatory Visit: Payer: Self-pay | Admitting: *Deleted

## 2015-05-31 ENCOUNTER — Telehealth: Payer: Self-pay | Admitting: Family Medicine

## 2015-05-31 MED ORDER — CLINDAMYCIN HCL 300 MG PO CAPS
ORAL_CAPSULE | ORAL | Status: DC
Start: 2015-05-31 — End: 2015-09-18

## 2015-05-31 NOTE — Telephone Encounter (Signed)
Med sent to pharm. Pt notified on voicemail.  

## 2015-05-31 NOTE — Telephone Encounter (Signed)
What is recommended is clindamycin 300 mg, take 2 approximately 30-60 minutes before dental procedure-as per the American Heart Association's guidelines on antibiotic prophylaxis

## 2015-05-31 NOTE — Telephone Encounter (Signed)
Patient needs antibiotic prophalaxis called in she is having dental work done on Monday. She uses [facility] [place].

## 2015-06-17 DIAGNOSIS — F3131 Bipolar disorder, current episode depressed, mild: Secondary | ICD-10-CM | POA: Diagnosis not present

## 2015-06-18 ENCOUNTER — Other Ambulatory Visit: Payer: Self-pay

## 2015-06-18 DIAGNOSIS — N6320 Unspecified lump in the left breast, unspecified quadrant: Secondary | ICD-10-CM

## 2015-06-18 DIAGNOSIS — Z9882 Breast implant status: Secondary | ICD-10-CM

## 2015-06-27 DIAGNOSIS — M25561 Pain in right knee: Secondary | ICD-10-CM | POA: Diagnosis not present

## 2015-06-27 DIAGNOSIS — M25562 Pain in left knee: Secondary | ICD-10-CM | POA: Diagnosis not present

## 2015-06-27 DIAGNOSIS — M25551 Pain in right hip: Secondary | ICD-10-CM | POA: Diagnosis not present

## 2015-06-27 DIAGNOSIS — Z96651 Presence of right artificial knee joint: Secondary | ICD-10-CM | POA: Diagnosis not present

## 2015-06-27 DIAGNOSIS — Z471 Aftercare following joint replacement surgery: Secondary | ICD-10-CM | POA: Diagnosis not present

## 2015-07-16 ENCOUNTER — Other Ambulatory Visit: Payer: Self-pay | Admitting: Dermatology

## 2015-07-16 DIAGNOSIS — L309 Dermatitis, unspecified: Secondary | ICD-10-CM | POA: Diagnosis not present

## 2015-07-16 DIAGNOSIS — D485 Neoplasm of uncertain behavior of skin: Secondary | ICD-10-CM | POA: Diagnosis not present

## 2015-07-16 DIAGNOSIS — D239 Other benign neoplasm of skin, unspecified: Secondary | ICD-10-CM | POA: Diagnosis not present

## 2015-07-16 DIAGNOSIS — L820 Inflamed seborrheic keratosis: Secondary | ICD-10-CM | POA: Diagnosis not present

## 2015-07-16 DIAGNOSIS — L940 Localized scleroderma [morphea]: Secondary | ICD-10-CM | POA: Diagnosis not present

## 2015-07-29 DIAGNOSIS — F3131 Bipolar disorder, current episode depressed, mild: Secondary | ICD-10-CM | POA: Diagnosis not present

## 2015-08-06 ENCOUNTER — Other Ambulatory Visit: Payer: Self-pay | Admitting: Family Medicine

## 2015-08-08 ENCOUNTER — Encounter: Payer: Self-pay | Admitting: Family Medicine

## 2015-08-08 ENCOUNTER — Ambulatory Visit (INDEPENDENT_AMBULATORY_CARE_PROVIDER_SITE_OTHER): Payer: Medicare Other | Admitting: Family Medicine

## 2015-08-08 VITALS — BP 116/72 | Ht 61.25 in | Wt 124.4 lb

## 2015-08-08 DIAGNOSIS — R1013 Epigastric pain: Secondary | ICD-10-CM

## 2015-08-08 DIAGNOSIS — R1011 Right upper quadrant pain: Secondary | ICD-10-CM

## 2015-08-08 MED ORDER — PANTOPRAZOLE SODIUM 40 MG PO TBEC: 40.0000 mg | DELAYED_RELEASE_TABLET | Freq: Every day | ORAL | Status: DC

## 2015-08-08 NOTE — Progress Notes (Signed)
   Subjective:    Patient ID: Jenna Contreras, female    DOB: [birth year], 70 y.o.   MRN: [id number]  HPI  Patient arrives with c/o epigastric pain off and on for 6 months-severe at times and has a swelling with it at times. Patient also having fatigue and migraines. Patient with severe pain discomfort in epigastrium off and on for the past 6 months worse at sometimes better and other times nothing seems to make it better nothing seems to make it worse she has not tried anything other than varying her food and occasional over-the-counter antacids she denies rectal bleeding denies a magnesium denies dysphagia denies lower abdominal pain is epigastric pain right upper quadrant pain she's never had her gallbladder taken out. She does have history of back surgery and is on multiple medications including anti-inflammatories Review of Systems See above Greater and 25 minutes spent with the patient discussing multiple aspects of this illness    Objective:   Physical Exam  Lungs clear hearts regular abdomen moderate epigastric right upper quadrant tenderness no guarding rebound extremities no edema skin warm dry      Assessment & Plan:  Severe abdominal pain and discomfort epigastrium radiating right upper quadrant patient does have her gallbladder. We need to rule out the possibility of pancreatic disease liver disease as well as gallbladder disease could well be epigastric gastritis I recommend PPI also recommend avoiding all anti-inflammatories and inciting medications. Follow-up within the next few weeks for recheck if not dramatically better within 1 week may need to do additional testing patient does need ultrasound and is clinically indicated rule out gallstones liver cyst and pancreatitis or swelling of the pancreas patient may well need EGD and gastroenterology referral but await the results of these tests patient has been well versed what warning signs to watch for  Recheck the patient  next week we will discuss fatigue and migraines closer at that time

## 2015-08-09 DIAGNOSIS — M810 Age-related osteoporosis without current pathological fracture: Secondary | ICD-10-CM | POA: Diagnosis not present

## 2015-08-09 DIAGNOSIS — R1013 Epigastric pain: Secondary | ICD-10-CM | POA: Diagnosis not present

## 2015-08-09 DIAGNOSIS — M549 Dorsalgia, unspecified: Secondary | ICD-10-CM | POA: Diagnosis not present

## 2015-08-09 DIAGNOSIS — E038 Other specified hypothyroidism: Secondary | ICD-10-CM | POA: Diagnosis not present

## 2015-08-09 DIAGNOSIS — E785 Hyperlipidemia, unspecified: Secondary | ICD-10-CM | POA: Diagnosis not present

## 2015-08-09 DIAGNOSIS — G8929 Other chronic pain: Secondary | ICD-10-CM | POA: Diagnosis not present

## 2015-08-09 DIAGNOSIS — R1011 Right upper quadrant pain: Secondary | ICD-10-CM | POA: Diagnosis not present

## 2015-08-10 LAB — BASIC METABOLIC PANEL
BUN/Creatinine Ratio: 23 (ref 12–28)
BUN: 21 mg/dL (ref 8–27)
CO2: 27 mmol/L (ref 18–29)
Calcium: 10.2 mg/dL (ref 8.7–10.3)
Chloride: 99 mmol/L (ref 96–106)
Creatinine, Ser: 0.9 mg/dL (ref 0.57–1.00)
GFR calc Af Amer: 74 mL/min/{1.73_m2} (ref >59)
GFR, EST NON AFRICAN AMERICAN: 65 mL/min/{1.73_m2} (ref >59)
Glucose: 83 mg/dL (ref 65–99)
POTASSIUM: 4.8 mmol/L (ref 3.5–5.2)
SODIUM: 142 mmol/L (ref 134–144)

## 2015-08-10 LAB — CBC WITH DIFFERENTIAL/PLATELET
BASOS: 1 %
Basophils Absolute: 0.1 10*3/uL (ref 0.0–0.2)
EOS (ABSOLUTE): 0.2 10*3/uL (ref 0.0–0.4)
Eos: 2 %
HEMATOCRIT: 42 % (ref 34.0–46.6)
Hemoglobin: 14 g/dL (ref 11.1–15.9)
IMMATURE GRANS (ABS): 0 10*3/uL (ref 0.0–0.1)
Immature Granulocytes: 0 %
LYMPHS ABS: 3.5 10*3/uL — AB (ref 0.7–3.1)
Lymphs: 45 %
MCH: 29.2 pg (ref 26.6–33.0)
MCHC: 33.3 g/dL (ref 31.5–35.7)
MCV: 88 fL (ref 79–97)
MONOS ABS: 0.3 10*3/uL (ref 0.1–0.9)
Monocytes: 5 %
NEUTROS ABS: 3.6 10*3/uL (ref 1.4–7.0)
Neutrophils: 47 %
PLATELETS: 322 10*3/uL (ref 150–379)
RBC: 4.8 x10E6/uL (ref 3.77–5.28)
RDW: 14.5 % (ref 12.3–15.4)
WBC: 7.6 10*3/uL (ref 3.4–10.8)

## 2015-08-10 LAB — LIPID PANEL
CHOL/HDL RATIO: 2.8 ratio (ref 0.0–4.4)
CHOLESTEROL TOTAL: 229 mg/dL — AB (ref 100–199)
HDL: 81 mg/dL (ref >39)
LDL CALC: 136 mg/dL — AB (ref 0–99)
Triglycerides: 62 mg/dL (ref 0–149)
VLDL CHOLESTEROL CAL: 12 mg/dL (ref 5–40)

## 2015-08-10 LAB — TSH: TSH: 1.12 u[IU]/mL (ref 0.450–4.500)

## 2015-08-10 LAB — HEPATIC FUNCTION PANEL
ALBUMIN: 4.8 g/dL (ref 3.5–4.8)
ALT: 19 IU/L (ref 0–32)
AST: 15 IU/L (ref 0–40)
Alkaline Phosphatase: 78 IU/L (ref 39–117)
BILIRUBIN TOTAL: 0.5 mg/dL (ref 0.0–1.2)
Bilirubin, Direct: 0.15 mg/dL (ref 0.00–0.40)
TOTAL PROTEIN: 7.6 g/dL (ref 6.0–8.5)

## 2015-08-10 LAB — LIPASE: Lipase: 14 U/L (ref 0–59)

## 2015-08-10 LAB — VITAMIN D 25 HYDROXY (VIT D DEFICIENCY, FRACTURES): VIT D 25 HYDROXY: 78.7 ng/mL (ref 30.0–100.0)

## 2015-08-11 ENCOUNTER — Encounter: Payer: Self-pay | Admitting: Family Medicine

## 2015-08-12 ENCOUNTER — Other Ambulatory Visit: Payer: Self-pay | Admitting: *Deleted

## 2015-08-12 DIAGNOSIS — R1013 Epigastric pain: Secondary | ICD-10-CM

## 2015-08-15 ENCOUNTER — Ambulatory Visit (HOSPITAL_COMMUNITY)
Admission: RE | Admit: 2015-08-15 | Discharge: 2015-08-15 | Disposition: A | Discharge status: Home / Self Care | Payer: Medicare Other | Source: Ambulatory Visit | Attending: Family Medicine | Admitting: Family Medicine

## 2015-08-15 DIAGNOSIS — R1013 Epigastric pain: Secondary | ICD-10-CM | POA: Diagnosis not present

## 2015-08-15 DIAGNOSIS — K760 Fatty (change of) liver, not elsewhere classified: Secondary | ICD-10-CM | POA: Diagnosis not present

## 2015-08-16 ENCOUNTER — Other Ambulatory Visit: Payer: Self-pay

## 2015-08-16 DIAGNOSIS — R1013 Epigastric pain: Secondary | ICD-10-CM

## 2015-08-19 ENCOUNTER — Encounter: Payer: Self-pay | Admitting: Family Medicine

## 2015-08-20 ENCOUNTER — Ambulatory Visit: Payer: Medicare Other | Admitting: Family Medicine

## 2015-08-21 ENCOUNTER — Encounter (HOSPITAL_COMMUNITY)
Admission: RE | Admit: 2015-08-21 | Discharge: 2015-08-21 | Disposition: A | Discharge status: Home / Self Care | Payer: Medicare Other | Source: Ambulatory Visit | Attending: Family Medicine | Admitting: Family Medicine

## 2015-08-21 ENCOUNTER — Encounter (HOSPITAL_COMMUNITY): Payer: Self-pay

## 2015-08-21 DIAGNOSIS — R1013 Epigastric pain: Secondary | ICD-10-CM | POA: Diagnosis not present

## 2015-08-21 DIAGNOSIS — R109 Unspecified abdominal pain: Secondary | ICD-10-CM | POA: Diagnosis not present

## 2015-08-21 MED ORDER — SINCALIDE 5 MCG IJ SOLR
INTRAMUSCULAR | Status: AC
  Administered 2015-08-21: 1.12 ug via INTRAVENOUS
  Filled 2015-08-21: qty 5

## 2015-08-21 MED ORDER — STERILE WATER FOR INJECTION IJ SOLN
INTRAMUSCULAR | Status: AC
Start: 2015-08-21 — End: 2015-08-21
  Administered 2015-08-21: 1.12 mL via INTRAVENOUS
  Filled 2015-08-21: qty 10

## 2015-08-21 MED ORDER — SODIUM CHLORIDE 0.9% FLUSH
INTRAVENOUS | Status: AC
  Filled 2015-08-21: qty 40

## 2015-08-21 MED ORDER — TECHNETIUM TC 99M MEBROFENIN IV KIT
5.0000 | PACK | Freq: Once | INTRAVENOUS | Status: AC | PRN
  Administered 2015-08-21: 5.2 via INTRAVENOUS

## 2015-08-22 ENCOUNTER — Encounter: Payer: Self-pay | Admitting: Gastroenterology

## 2015-08-29 ENCOUNTER — Encounter: Payer: Self-pay | Admitting: Family Medicine

## 2015-08-29 ENCOUNTER — Other Ambulatory Visit: Payer: Self-pay

## 2015-08-29 ENCOUNTER — Ambulatory Visit (INDEPENDENT_AMBULATORY_CARE_PROVIDER_SITE_OTHER): Payer: Medicare Other | Admitting: Family Medicine

## 2015-08-29 VITALS — BP 92/58 | Ht 61.25 in | Wt 123.0 lb

## 2015-08-29 DIAGNOSIS — M549 Dorsalgia, unspecified: Secondary | ICD-10-CM | POA: Diagnosis not present

## 2015-08-29 DIAGNOSIS — Z79891 Long term (current) use of opiate analgesic: Secondary | ICD-10-CM

## 2015-08-29 DIAGNOSIS — G8929 Other chronic pain: Secondary | ICD-10-CM | POA: Diagnosis not present

## 2015-08-29 DIAGNOSIS — R1013 Epigastric pain: Secondary | ICD-10-CM

## 2015-08-29 MED ORDER — HYDROCODONE-ACETAMINOPHEN 10-325 MG PO TABS: ORAL_TABLET | ORAL | Status: DC

## 2015-08-29 NOTE — Patient Instructions (Signed)
Narcotic medication treatment agreement-educational material and consent form. Your health problem-because you are having problems with pain you are being prescribed narcotic medication to help control your pain. The purpose of narcotic medication-narcotics are a type of drug that should help you with your pain and let you be more active in your daily life. It is not expected that your pain will go away completely. There are risks associated with these drugs and you can also have side effects. It is important for you to be honest with your doctor about your pain and the dose of medication you are taking. It is also important to be honest with your doctor about any potential problems or side effects with the medications that you are having. Risk and common problems-narcotic use has been associated with the following issues Addiction- there is a chance that you could become addicted to narcotic drugs. This means that you once the drug and will try very hard to get it, even if it causes you harm or other problems in your life. This chance is greater in people who are young, have mental illness, have been addicted to any drug in the past, or have a close relative that has been addicted to a drug in the past. Your doctor may tell you that you need tests or should see other health providers to help you avoid addiction. Allergic reaction - all kinds of allergic reactions can happen. You could have a minor reaction such as a rash or severe reaction such as swelling of your tongue or throat. A severe reaction as a medical emergency that can cause death. Incomplete relief of pain - narcotic medications do not take away all of your pain. Your doctor will work with you to try to optimize treatment but it is not reasonable to expect complete relief of pain. Low testosterone levels in men - narcotic drugs may cause the levels of the hormone testosterone to drop in men. This could change your mood and energy level. It may  also lessen the desire to have sex. Testosterone supplementation in this situation is not recommended. Physical dependence- you may not feel well if your dose is suddenly stopped. Some common symptoms of withdraw are runny nose, excessive yawning, goosebumps, nausea with stomach pains, diarrhea, body aches, and increased irritability. Side effects- there are many side effects of narcotic drugs. Constipation, nausea, vomiting, itching, dizziness are all potential side effects. Slowed breathing- excessive doses of narcotics can slow your breathing. Do not use other drugs or drink alcohol while taking narcotic drugs. This can cause accidental death. Follow directions on how the medication is prescribed. If you feel you are having problems then notify your doctor. Slowed reaction time- you may feel sleepy and be slow to react. If this happens, then you should not drive, use heavy machinery or guns, or be at unsafe heights, or be caring for someone else. Tolerance- your body could become use to the dose of narcotic drugs that your doctor tells you to take, and you may not get the same relief of pain that you had before. A higher dose may not help and could cause potential side effects. Increased risk of accidental death can occur due to the narcotic medication or side effects. If you are having any of the problems listed above you need to discuss these with your physician.  Other choices- you do not have to take narcotics. The decision to take narcotics for pain his ureters. There are other choices that you may choose.  There are several alternatives that may be helpful. These can be done in place of narcotics or in some cases along with narcotics. - Anti-inflammatories, antidepressants, and seizure medications can be helpful -Physical therapy, wearing a brace, surgical referral, home exercise regimens, could potentially help -Referral to a specialist-you may wish to see a specialist who specializes in pain  management -You can choose to do nothing and live with the pain you have -Your doctor will discuss with you your choices but the decision his ureters. How well any other treatment works will depend on your specific health problem. More facts-there may be local, [place], or federal laws that your doctor must follow with prescribing narcotic drugs. It is not clear if narcotic painkillers are good for you to take for a long period of time. You should discuss with your doctor often about the good and bad effects that these drugs may have on you.  You should not take narcotic drugs if you are pregnant. If you become pregnant notify your doctor right away. Narcotic drugs can raise a chance of having a miscarriage or having a baby born with a birth defect. Your baby can also be born addicted to the drug. Should you become pregnant you will have to discontinue narcotic use. Treatment agreement - by signing the consent form you agree that you understand the rules for taking narcotic drugs. If you do not follow these rules, then your doctor may refer you to a specialist, no longer prescribe pain medications for you, and release you/terminate you from his or her care. Drug safety-you must lock your drugs in a safe place. They must be kept away from children. We will also were review with you the right way to get rid of any extra drugs.  You may not sell, share, or let other people use your drugs. This is a crime and can cause overdoses. You may be asked to come to our office between scheduled appointments for random pill counts. Failure to comply with this will result in dismissal. Instructions for taking narcotic drugs-you are only to take pain medications that is prescribed by our office. Do not combine your pain medication with other physician narcotic pain medications or other peoples pain medications. Do not stop taking your narcotic suddenly.  Do not drive after a new pain drug is started or after a dose is  increased until you are sure it does not make you sleepy or confused. Do not try to cut or crush your drug unless told to do so. This could cause death. Your drug will be stopped if it is not helping enough or if it is showing signs of harm to you. You must tell your doctor about any new drugs or health problems. Your drug may not work well or may work differently if you have certain health problems.  You must tell your doctor about problems you have with any drugs prescribed or illegal. If you feel you're having an addiction problem with prescribed or illegal drugs you will discuss this with your doctor in order to be referred for treatment.  Your doctor reserves the right to limit other medications that can interact with pain medications. Prescriptions and refills- your narcotic drugs will be prescribed by our office only. You may not ask for pain drugs from any other doctors including emergency room doctors. Our office will decide how many refills you will be given and how often he will need to be seen in our office in order to get  them area at the very least every 3 month appointments are required. Never try to change a prescription. If you do this then it will be reported to the police. You will also be terminated from the practice. Your prescription will not be replaced if lost, stolen, or destroyed by accident. Patients on regular narcotics must keep their office visits on a regular basis-always every 3 months or less. It is at that appointment they will receive their prescriptions. Do not call for an additional month supply. An office visit is necessary. Appointments- you will keep all appointments with doctors, therapist, and counselors. If you miss your scheduled appointments often, then your doctor may slowly decrease your dose of narcotic drugs until you are no longer taking it. The dates when your prescription was filled at the pharmacy will be per 5. The state wide database will be checked at  standard visits to make sure the patient is not receiving pain prescriptions from other physicians.  Random drug testing for illegal substances will be standard. Your doctor may have you give urine, blood, hair, or saliva to run tests. If you have test results that are not normal then your doctor may slowly decrease your dose of narcotic drugs until you are no longer taking the medication or stop prescribing additional pain medications immediately. Refusal to do urine drug testing is basis for the practice no longer prescribe narcotic pain medications. Pain management specialist If your provider feels it is in your best interest to see a pain medicine specialist we will advise you have such and help you with the referral. Sometimes this referral is made because standard dosing of short acting medication is no longer keeping the patient's pain under reasonable control. Sometimes this is based upon a patient's health issue, and it is felt that pain management would best serve the patient's needs.  Once under the care of pain management we will no longer be prescribing the narcotic medications. This will be under the guidance of the pain management specialist. Further pain medication prescriptions will not be reassumed by this office once referred to pain management. In these situations we will continue to provide primary care but not pain medications.  Violations of the pain management agreement will result in our office no longer prescribing pain medications. In some situations it will also result in dismissal of the patient from our practice.  Health information-your doctor may need to discuss your treatment with pharmacists or other providers. Legal authorities may ask for your pain treatment records. If this happens then records will be given to them up on proper documentation/release.  You should also be aware that properly taking pain medications is very important in regards to operating a vehicle.  Even with following proper prescriptions instructions it is possible to be charged with operating a vehicle under influence. It is highly important that if you feel drowsy or drug that you do not operate a motor vehicle for the safety of yourself and others.

## 2015-08-29 NOTE — Progress Notes (Signed)
Subjective:    Patient ID: Jenna Contreras, female    DOB: 21-Jul-1945, 70 y.o.   MRN: [id number]  HPI This patient was seen today for chronic pain. Takes for back pain. Med helps somewhat. Doesn't completely take it away. Pt states she is in pain all the time.   The medication list was reviewed and updated.   -Compliance with medication: yes  - Number patient states they take daily:  One tid but wants increased.   -when was the last dose patient took? yesterday  The patient was advised the importance of maintaining medication and not using illegal substances with these.  Refills needed: yes  The patient was educated that we can provide 3 monthly scripts for their medication, it is their responsibility to follow the instructions.  Side effects or complications from medications: makes loopy  Patient is aware that pain medications are meant to minimize the severity of the pain to allow their pain levels to improve to allow for better function. They are aware of that pain medications cannot totally remove their pain.  Due for UDT ( at least once per year) : done today  Pt wants to increase chlorzoxazone from bid to tid.   Needs refill on zofran.  Pt not taking protonix because of side effects. Still having abd pain. Wants referral to GI. Sees one in [place]. Pt unsure of the name.   Swollen areas on right and left side since surgery in August.   Patient is concerned about her back she wonders if she ought to be doing some exercise her back specialist told her know physical therapy for at least one year after the surgery She also has ongoing abdominal pain and discomfort she describes as aching and burning she did not start taking her PPI she denies any rectal bleeding denies any vomiting denies wheezing difficulty breathing chest tightness or pain She states the pain medication is helping some but not doing enough to keep her pain under control she would like to consider going up  on a hard at   Review of Systems  Constitutional: Negative for activity change and appetite change.  Gastrointestinal: Negative for vomiting and abdominal pain.  Neurological: Negative for weakness.  Psychiatric/Behavioral: Negative for confusion.       Objective:   Physical Exam  Constitutional: She appears well-nourished. No distress.  HENT:  Head: Normocephalic.  Cardiovascular: Normal rate, regular rhythm and normal heart sounds.   No murmur heard. Pulmonary/Chest: Effort normal and breath sounds normal.  Musculoskeletal: She exhibits no edema.  Lymphadenopathy:    She has no cervical adenopathy.  Neurological: She is alert.  Psychiatric: Her behavior is normal.  Vitals reviewed.   25 minutes was spent with the patient. Greater than half the time was spent in discussion and answering questions and counseling regarding the issues that the patient came in for today.      Assessment & Plan:  Pain management paced states pain medicine is not known quite well enough to help alleviate the pain the laboratory to be more functional she denies a causing any drowsiness in the past she's been able to tolerate oxycodone I do not one ago back oxycodone at this point increase her hydrocodone 10 mg 3 times daily in addition to this I recommend that we hold the other 2 scripts until her urine drug screen comes back long discussion held regarding pain contract in the importance of safely improperly taking the medication  As for her muscle relaxers I  told her that she should not do more than 2 per day  Patient with significant abdominal pain and discomfort in the epigastric region she did not start taking the PPI because she was afraid that it could cause lupus I encouraged her to start taking the PPI she is staying away from Fosamax we will go ahead and have her see gastroenterology in [facility] more than likely they will do EGD. Hopefully they will also comment on whether or not  they recommend for her to go back on Fosamax or not. I do not feel she needs a scan at this point.

## 2015-09-06 ENCOUNTER — Ambulatory Visit: Payer: Medicare Other | Admitting: Family Medicine

## 2015-09-06 LAB — TOXASSURE SELECT 13 (MW), URINE: PDF: 0

## 2015-09-06 LAB — PLEASE NOTE

## 2015-09-09 ENCOUNTER — Encounter: Payer: Self-pay | Admitting: Family Medicine

## 2015-09-18 ENCOUNTER — Telehealth: Payer: Self-pay | Admitting: Gastroenterology

## 2015-09-18 ENCOUNTER — Ambulatory Visit: Payer: Medicare Other | Admitting: Gastroenterology

## 2015-09-18 ENCOUNTER — Other Ambulatory Visit: Payer: Self-pay

## 2015-09-18 ENCOUNTER — Encounter: Payer: Self-pay | Admitting: Gastroenterology

## 2015-09-18 ENCOUNTER — Ambulatory Visit (INDEPENDENT_AMBULATORY_CARE_PROVIDER_SITE_OTHER): Payer: Medicare Other | Admitting: Gastroenterology

## 2015-09-18 VITALS — BP 94/62 | HR 76 | Temp 97.7°F | Ht 62.0 in | Wt 123.0 lb

## 2015-09-18 DIAGNOSIS — R1013 Epigastric pain: Secondary | ICD-10-CM | POA: Insufficient documentation

## 2015-09-18 DIAGNOSIS — Z800 Family history of malignant neoplasm of digestive organs: Secondary | ICD-10-CM | POA: Diagnosis not present

## 2015-09-18 DIAGNOSIS — G8929 Other chronic pain: Secondary | ICD-10-CM

## 2015-09-18 MED ORDER — PEG 3350-KCL-NA BICARB-NACL 420 G PO SOLR: 4000.0000 mL | ORAL | Status: DC

## 2015-09-18 MED ORDER — NA SULFATE-K SULFATE-MG SULF 17.5-3.13-1.6 GM/177ML PO SOLN: 1.0000 | ORAL | Status: DC

## 2015-09-18 NOTE — Progress Notes (Signed)
Primary Care Physician:  Sallee Lange, MD Primary Gastroenterologist:  Dr. Oneida Alar   Chief Complaint  Patient presents with  . Abdominal Pain    HPI:   Jenna Contreras is a 70 y.o. female presenting today at the request of Dr. Wolfgang Phoenix secondary to abdominal pain. She notes a remote history of unprovoked vomiting that has now resolved, but now she is dealing with intermittent epigastric pain for at least 6 months. States this is unrelated to eating or drinking. Sometimes will last up to 8 hours at a time, lasting into the next day. Sometimes only 4 hours at a time. Denies any typical reflux, heartburn. No dysphagia. States she was given Protonix by Dr. Wolfgang Phoenix but did not want to take this. She states her oral Voltaren has been put on hold for now. She takes [facility] powders occasionally for migraines. She notes that in the 90s, she was told she had "no lining in my stomach" after an endoscopy by [facility]. She notes that she has seen Dr. Britta Mccreedy in the past and is due for a colonoscopy. Her father had a history of colon cancer and succumbed to the disease. She states she would like to establish care here now. I discussed with her that we recommend keeping care within one GI practice, and she understands this. She also endorses chronic constipation, taking stool softeners and laxatives without much improvement.   Normal HIDA scan with EF of 97%. US abdomen with fatty liver. HFP normal. Lipase normal.   Past Medical History  Diagnosis Date  . Scoliosis   . Migraines   . Arthritis   . Hypothyroidism   . Fibromyalgia   . Depression   . OCD (obsessive compulsive disorder)   . Arthritis   . Osteoporosis   . Psoriasis   . Constipation   . GERD (gastroesophageal reflux disease)   . Anxiety   . Bipolar 2 disorder ([place])   . Low BP   . History of gout   . HNP (herniated nucleus pulposus with myelopathy), thoracic   . History of skin cancer   . Asthma   . Heart murmur     As small child   .  Left eye injury     In ED 06/17/2014   . Cancer ([place])     Basal Cell    Past Surgical History  Procedure Laterality Date  . Dilation and curettage of uterus  x3  . Tubal ligation    . Arthroscopic left knee surgery    . Wrist surgery    . Mandible surgery      X 2  . Tonsillectomy    . Total hip arthroplasty Right 05/09/2013    Procedure: RIGHT TOTAL HIP ARTHROPLASTY ANTERIOR APPROACH;  Surgeon: Mauri Pole, MD;  Location: [facility];  Service: Orthopedics;  Laterality: Right;  . Partial knee arthroplasty Left 02/12/2014    Procedure: LEFT KNEE UNICOMPARTMENTAL MEDIALLY ARTHROPLASTY ;  Surgeon: Mauri Pole, MD;  Location: [facility];  Service: Orthopedics;  Laterality: Left;  . Partial knee arthroplasty Right 07/02/2014    Procedure: RIGHT UNI KNEE ARTHOPLASTY MEDIALLY;  Surgeon: Paralee Cancel, MD;  Location: [facility];  Service: Orthopedics;  Laterality: Right;  . Hip surgery Right 2015  . Knee surgery Bilateral 2015, 2016  . Iliac wings    . Rod in spine      scoliosis     Current Outpatient Prescriptions  Medication Sig Dispense Refill  . albuterol (PROVENTIL HFA;VENTOLIN HFA)  108 (90 BASE) MCG/ACT inhaler Inhale 2 puffs into the lungs every 4 (four) hours as needed for wheezing or shortness of breath. 1 Inhaler 2  . Artificial Tear Ointment (REFRESH P.M. OP) Apply 1 application to eye at bedtime.    . chlorpheniramine (CHLOR-TRIMETON) 4 MG tablet Take 4 mg by mouth 2 (two) times daily as needed for allergies. Reported on 05/20/2015    . Cholecalciferol (VITAMIN D) 2000 UNITS CAPS Take by mouth daily. Reported on 09/18/2015    . escitalopram (LEXAPRO) 10 MG tablet Take 10 mg by mouth daily.    . fluticasone (FLONASE) 50 MCG/ACT nasal spray Place 1 spray into both nostrils 2 (two) times daily. 16 g 1  . fluticasone (FLOVENT HFA) 110 MCG/ACT inhaler Inhale 2 puffs into the lungs 2 (two) times daily. 1 Inhaler 3  . HYDROcodone-acetaminophen (NORCO) 10-325 MG tablet One tid prn 90 tablet 0  .  lamoTRIgine (LAMICTAL) 100 MG tablet Take 1 tablet (100 mg total) by mouth 2 (two) times daily. 180 tablet 2  . levothyroxine (SYNTHROID, LEVOTHROID) 88 MCG tablet Take 1 tablet (88 mcg total) by mouth daily before breakfast. 90 tablet 1  . LORazepam (ATIVAN) 0.5 MG tablet TAKE 1 TABLET BY MOUTH TWICE A DAY AS NEEDED FOR ANXIETY (Patient taking differently: TAKE 1 TABLET BY MOUTH  THREE TIMES A DAY AS NEEDED FOR ANXIETY) 35 tablet 1  . MAXALT 10 MG tablet TAKE 1 TABLET ONCE AS NEEDED FOR MIGRAINE. MAY REPEAT IN 2 HOURS IF NEEDED, NO GREATER THAN 4 TABLETS PER WEEK (MUST LAST 90 DAYS) 48 tablet 3  . mometasone (ELOCON) 0.1 % cream Apply 1 application topically 2 (two) times daily as needed. 135 g 2  . NON FORMULARY MAGNESIUM  3 TABLETS DAILY ( ? STRENTH)    . ondansetron (ZOFRAN) 4 MG tablet Take 1 tablet (4 mg total) by mouth every 8 (eight) hours as needed for nausea or vomiting. 30 tablet 2  . Polyvinyl Alcohol-Povidone (REFRESH OP) Apply 1 drop to eye 2 (two) times daily as needed (Dry eyes). Reported on 05/20/2015    . traZODone (DESYREL) 50 MG tablet Take 1 1/2 tablet qhs 135 tablet 2  . Na Sulfate-K Sulfate-Mg Sulf (SUPREP BOWEL PREP KIT) 17.5-3.13-1.6 GM/180ML SOLN Take 1 kit by mouth as directed. 1 Bottle 0   No current facility-administered medications for this visit.    Allergies as of 09/18/2015 - Review Complete 09/18/2015  Allergen Reaction Noted  . Prednisone Other (See Comments) 05/09/2013  . Adhesive [tape]  12/26/2012  . Betadine [povidone iodine] Other (See Comments) 10/05/2011  . Demerol [meperidine] Nausea And Vomiting 05/05/2013  . Latex Other (See Comments) 11/18/2011  . Levaquin [levofloxacin in d5w]  08/05/2011  . Silvadene [silver sulfadiazine]    . Ampicillin Rash 09/13/2012  . Cephalexin Itching and Rash 12/18/2010  . Doxycycline Itching and Rash 12/18/2010  . Erythromycin Rash 05/03/2013  . Keflex [cephalexin] Rash 09/13/2012  . Penicillins Itching and Rash  12/18/2010  . Tetracyclines & related Itching and Rash 12/18/2010    Family History  Problem Relation Age of Onset  . Heart disease Mother   . Diabetes Mother   . Colon cancer Father 75  . Heart disease Brother     Social History   Social History  . Marital Status: Married    Spouse Name: N/A  . Number of Children: N/A  . Years of Education: N/A   Occupational History  . Counselor and retired Marine scientist  Social History Main Topics  . Smoking status: Former Smoker -- 2.00 packs/day for 19 years    Types: Cigarettes    Quit date: 03/10/1979  . Smokeless tobacco: Never Used     Comment: Quit in 1981  . Alcohol Use: No     Comment: no  . Drug Use: No  . Sexual Activity: Not Currently    Birth Control/ Protection: Post-menopausal   Other Topics Concern  . Not on file   Social History Narrative    Review of Systems: As mentioned in HPI   Physical Exam: BP 94/62 mmHg  Pulse 76  Temp(Src) 97.7 F (36.5 C) (Oral)  Ht '5\' 2"'  (1.575 m)  Wt 123 lb (55.792 kg)  BMI 22.49 kg/m2 General:   Alert and oriented. Pleasant and cooperative. Well-nourished and well-developed.  Head:  Normocephalic and atraumatic. Eyes:  Without icterus, sclera clear and conjunctiva pink.  Ears:  Normal auditory acuity. Nose:  No deformity, discharge,  or lesions. Lungs:  Clear to auscultation bilaterally. No wheezes, rales, or rhonchi. No distress.  Heart:  S1, S2 present without murmurs appreciated.  Abdomen:  +BS, soft, TTP epigastric and non-distended. No HSM noted. No guarding or rebound. No masses appreciated.  Rectal:  Deferred  Extremities:  Without  edema. Neurologic:  Alert and  oriented x4;  grossly normal neurologically. Psych:  Alert and cooperative. Normal mood and affect.

## 2015-09-18 NOTE — Telephone Encounter (Signed)
LMOM to call.

## 2015-09-18 NOTE — Assessment & Plan Note (Signed)
Previously has had colonoscopies by Dr. Britta Mccreedy, but she would like to transfer care here. I am requesting last colonoscopy reports. Patient states she is due for a colonoscopy and denies any prior history of polyps. Chronic constipation not ideally managed with OTC agents. No concerning lower GI symptoms currently. Will trial Linzess 72 mcg once each day. Samples provided. Will also proceed with colonoscopy at time of EGD.   Proceed with colonoscopy with Dr. Oneida Alar in the near future. The risks, benefits, and alternatives have been discussed in detail with the patient. They state understanding and desire to proceed.  PROPOFOL due to polypharmacy.

## 2015-09-18 NOTE — Patient Instructions (Signed)
For constipation: start taking Linzess 1 capsule each morning, 30 minutes before breakfast. You MIGHT have a small amount of loose stool for a few days but should get better. If not, call me. Let me know if you would like a prescription.  We have scheduled you for a colonoscopy and upper endoscopy with Dr. Oneida Alar.   Avoid BC powders, aspirin, Ibuprofen, and Voltaren as you are doing.

## 2015-09-18 NOTE — Assessment & Plan Note (Signed)
70 year old female with 6 month history of epigastric pain that is concerning for gastritis, PUD in the setting of intermittent NSAIDs, aspirin powders. She reports a history of what sounds like some type of endoscopic procedure in the 1990s by [facility], but these reports are not available. I have told her to take the Protonix that was prescribed by Dr. Wolfgang Phoenix and to continue holding the Voltaren and avoiding any other NSAIDs or aspirin powders for now. Does not appear to be biliary in origin, as HIDA and [place] were unrevealing. IF EGD were negative, would proceed with CT.   Proceed with upper endoscopy in the near future with Dr. Oneida Alar. The risks, benefits, and alternatives have been discussed in detail with patient. They have stated understanding and desire to proceed.  PROPOFOL due to polypharmacy

## 2015-09-18 NOTE — Progress Notes (Signed)
cc'ed to pcp °

## 2015-09-18 NOTE — Telephone Encounter (Signed)
I just want to make sure the patient knows to start taking Protonix once each day. She should have a prescription for this from Dr. Wolfgang Phoenix. If not, please call in Protonix 40 mg, take one each morning 30 minutes before breakfast, disp#30 with 3 refills.

## 2015-09-19 ENCOUNTER — Telehealth: Payer: Self-pay | Admitting: Family Medicine

## 2015-09-19 ENCOUNTER — Other Ambulatory Visit: Payer: Self-pay | Admitting: *Deleted

## 2015-09-19 MED ORDER — LEVOTHYROXINE SODIUM 88 MCG PO TABS: 88.0000 ug | ORAL_TABLET | Freq: Every day | ORAL | Status: DC

## 2015-09-19 NOTE — Telephone Encounter (Signed)
Pt called and said she talked to Dr. Wolfgang Phoenix and he is going to change her PPI and send in something else. She will call and let us know when he sends it in.

## 2015-09-19 NOTE — Telephone Encounter (Signed)
levothyroxine (SYNTHROID, LEVOTHROID) 88 MCG tablet  [facility] pt is out of this med  2) pt states you agreed to a different proton pump inhibitor  She would like that sent in too, called into [facility], [place] please

## 2015-09-19 NOTE — Telephone Encounter (Signed)
protonix 40 mg ,90, 1 rf 1qd

## 2015-09-19 NOTE — Telephone Encounter (Signed)
Last seen in June for med check. levothryroxine sent to pharm per protocol. See note about PPI.

## 2015-09-20 ENCOUNTER — Other Ambulatory Visit: Payer: Self-pay | Admitting: *Deleted

## 2015-09-20 MED ORDER — PANTOPRAZOLE SODIUM 40 MG PO TBEC: 40.0000 mg | DELAYED_RELEASE_TABLET | Freq: Every day | ORAL | Status: DC

## 2015-09-20 MED ORDER — LEVOTHYROXINE SODIUM 88 MCG PO TABS: 88.0000 ug | ORAL_TABLET | Freq: Every day | ORAL | Status: DC

## 2015-09-20 NOTE — Telephone Encounter (Signed)
30 day supply of both sent to [facility] [place] and 90 day supply of both sent to [facility]. Pt notified.

## 2015-09-25 IMAGING — CR DG PORTABLE PELVIS
1 series · 1 of 1 positions shown · non-contrast
Comparison: None.

CLINICAL DATA: Postop right hip replacement

EXAM:
PORTABLE PELVIS 1-2 VIEWS

[AP]
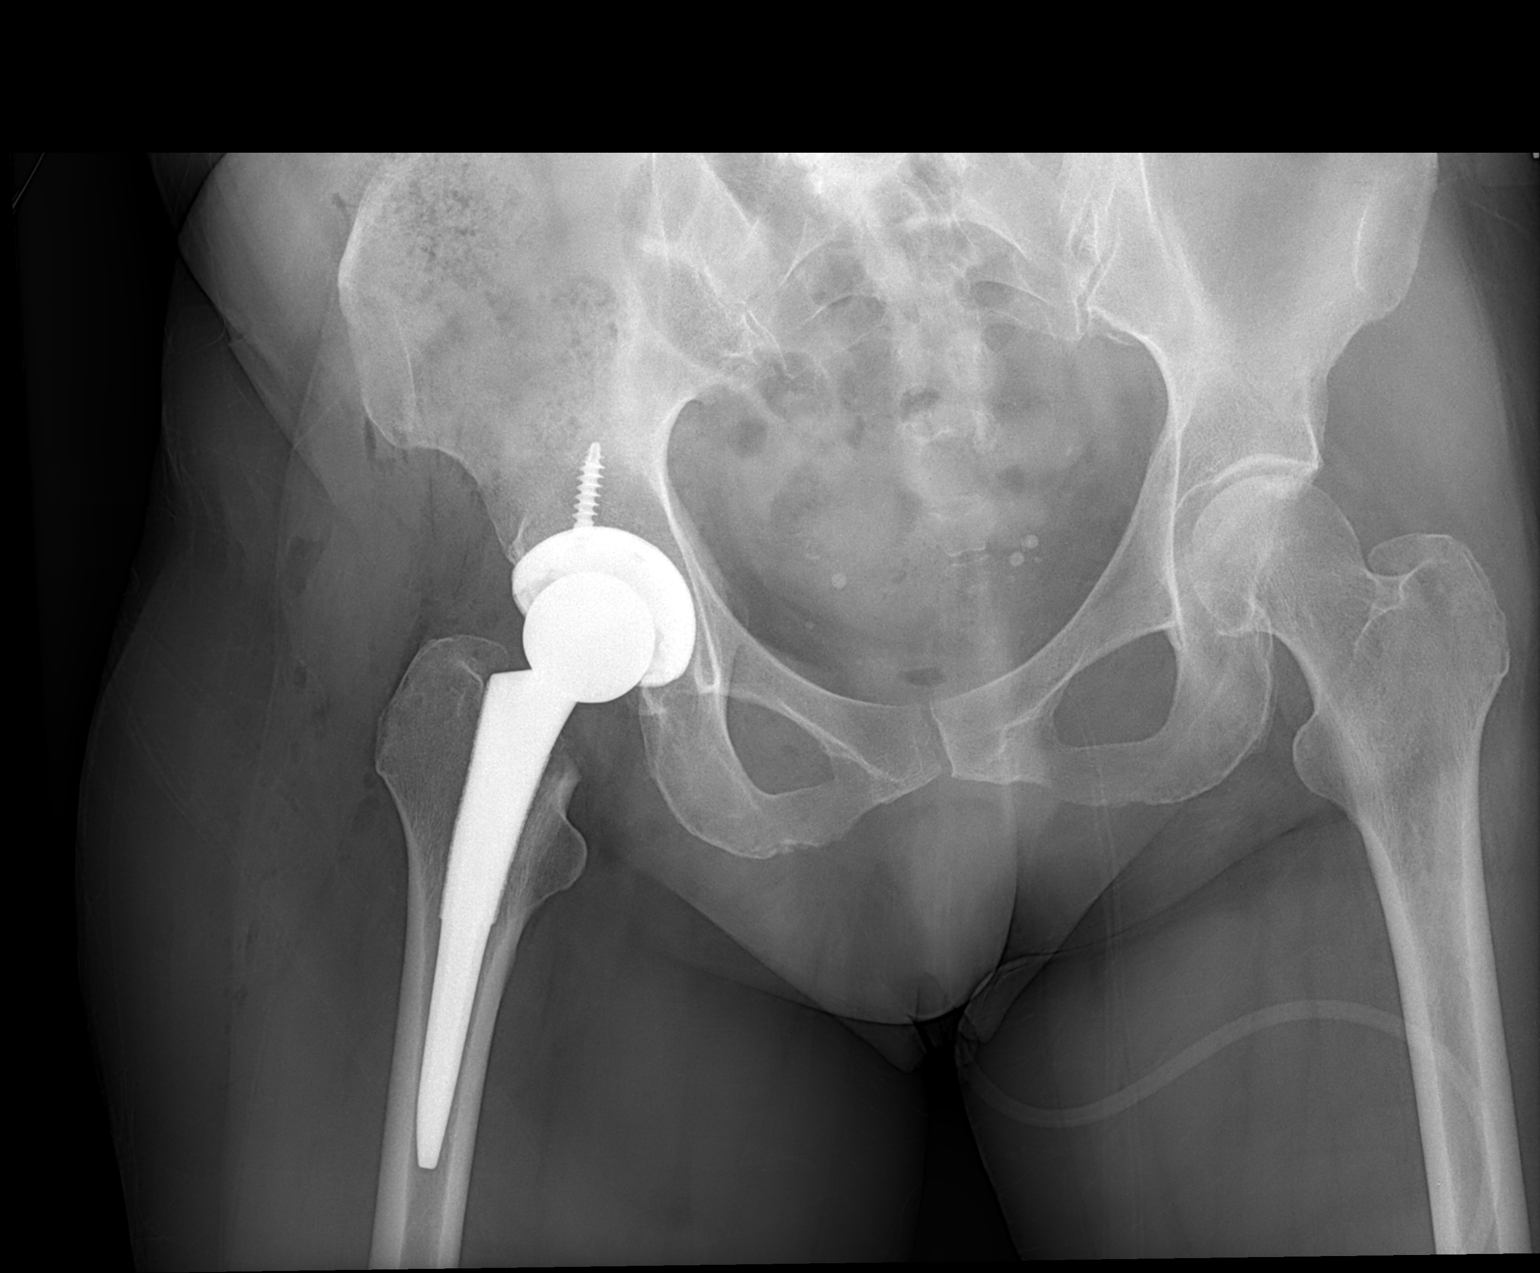

[1 of 1 positions shown; findings below may reference images not displayed]

FINDINGS: Right hip replacement in satisfactory position alignment. No
fracture or complication.
IMPRESSION: Satisfactory right hip replacement.

## 2015-09-28 DIAGNOSIS — F3131 Bipolar disorder, current episode depressed, mild: Secondary | ICD-10-CM | POA: Diagnosis not present

## 2015-10-02 ENCOUNTER — Telehealth: Payer: Self-pay | Admitting: Gastroenterology

## 2015-10-02 NOTE — Telephone Encounter (Signed)
Noted  

## 2015-10-02 NOTE — Telephone Encounter (Signed)
Pt called to cancel her pre op and her procedure for 8/1 with [facility]. She has another appointment that she will need to go to first.

## 2015-10-02 NOTE — Patient Instructions (Signed)
Jenna Contreras  10/02/2015     @PREFPERIOPPHARMACY @   Your procedure is scheduled on  10/08/2015   Report to [facility] at  830   A.M.  Call this number if you have problems the morning of surgery:  [phone]   Remember:  Do not eat food or drink liquids after midnight.  Take these medicines the morning of surgery with A SIP OF WATER lexapro, norco, synthroid, ativan, maxalt, zofran, protonix.   Do not wear jewelry, make-up or nail polish.  Do not wear lotions, powders, or perfumes.  You may wear deoderant.  Do not shave 48 hours prior to surgery.  Men may shave face and neck.  Do not bring valuables to the hospital.  [facility] is not responsible for any belongings or valuables.  Contacts, dentures or bridgework may not be worn into surgery.  Leave your suitcase in the car.  After surgery it may be brought to your room.  For patients admitted to the hospital, discharge time will be determined by your treatment team.  Patients discharged the day of surgery will not be allowed to drive home.   Name and phone number of your driver:   family Special instructions:  Follow the diet and prep instructions given to you by Dr Nona Dell office.  Please read over the following fact sheets that you were given. Coughing and Deep Breathing, Surgical Site Infection Prevention, Anesthesia Post-op Instructions and Care and Recovery After Surgery       Colonoscopy A colonoscopy is an exam to look at the entire large intestine (colon). This exam can help find problems such as tumors, polyps, inflammation, and areas of bleeding. The exam takes about 1 hour.  LET [facility] CARE PROVIDER KNOW ABOUT:   Any allergies you have.  All medicines you are taking, including vitamins, herbs, eye drops, creams, and over-the-counter medicines.  Previous problems you or members of your family have had with the use of anesthetics.  Any blood disorders you  have.  Previous surgeries you have had.  Medical conditions you have. RISKS AND COMPLICATIONS  Generally, this is a safe procedure. However, as with any procedure, complications can occur. Possible complications include:  Bleeding.  Tearing or rupture of the colon wall.  Reaction to medicines given during the exam.  Infection (rare). BEFORE THE PROCEDURE   Ask your health care provider about changing or stopping your regular medicines.  You may be prescribed an oral bowel prep. This involves drinking a large amount of medicated liquid, starting the day before your procedure. The liquid will cause you to have multiple loose stools until your stool is almost clear or light green. This cleans out your colon in preparation for the procedure.  Do not eat or drink anything else once you have started the bowel prep, unless your health care provider tells you it is safe to do so.  Arrange for someone to drive you home after the procedure. PROCEDURE   You will be given medicine to help you relax (sedative).  You will lie on your side with your knees bent.  A long, flexible tube with a light and camera on the end (colonoscope) will be inserted through the rectum and into the colon. The camera sends video back to a computer screen as it moves through the colon. The colonoscope also releases carbon dioxide gas to inflate the colon. This helps your health care provider see  the area better.  During the exam, your health care provider may take a small tissue sample (biopsy) to be examined under a microscope if any abnormalities are found.  The exam is finished when the entire colon has been viewed. AFTER THE PROCEDURE   Do not drive for 24 hours after the exam.  You may have a small amount of blood in your stool.  You may pass moderate amounts of gas and have mild abdominal cramping or bloating. This is caused by the gas used to inflate your colon during the exam.  Ask when your test  results will be ready and how you will get your results. Make sure you get your test results.   This information is not intended to replace advice given to you by your health care provider. Make sure you discuss any questions you have with your health care provider.   Document Released: 02/21/2000 Document Revised: 12/14/2012 Document Reviewed: 10/31/2012 Elsevier Interactive Patient Education 2016 [employer] Colonoscopy, Care After Refer to this sheet in the next few weeks. These instructions provide you with information on caring for yourself after your procedure. Your health care provider may also give you more specific instructions. Your treatment has been planned according to current medical practices, but problems sometimes occur. Call your health care provider if you have any problems or questions after your procedure. WHAT TO EXPECT AFTER THE PROCEDURE  After your procedure, it is typical to have the following:  A small amount of blood in your stool.  Moderate amounts of gas and mild abdominal cramping or bloating. HOME CARE INSTRUCTIONS  Do not drive, operate machinery, or sign important documents for 24 hours.  You may shower and resume your regular physical activities, but move at a slower pace for the first 24 hours.  Take frequent rest periods for the first 24 hours.  Walk around or put a warm pack on your abdomen to help reduce abdominal cramping and bloating.  Drink enough fluids to keep your urine clear or pale yellow.  You may resume your normal diet as instructed by your health care provider. Avoid heavy or fried foods that are hard to digest.  Avoid drinking alcohol for 24 hours or as instructed by your health care provider.  Only take over-the-counter or prescription medicines as directed by your health care provider.  If a tissue sample (biopsy) was taken during your procedure:  Do not take aspirin or blood thinners for 7 days, or as instructed by your  health care provider.  Do not drink alcohol for 7 days, or as instructed by your health care provider.  Eat soft foods for the first 24 hours. SEEK MEDICAL CARE IF: You have persistent spotting of blood in your stool 2-3 days after the procedure. SEEK IMMEDIATE MEDICAL CARE IF:  You have more than a small spotting of blood in your stool.  You pass large blood clots in your stool.  Your abdomen is swollen (distended).  You have nausea or vomiting.  You have a fever.  You have increasing abdominal pain that is not relieved with medicine.   This information is not intended to replace advice given to you by your health care provider. Make sure you discuss any questions you have with your health care provider.   Document Released: 10/08/2003 Document Revised: 12/14/2012 Document Reviewed: 10/31/2012 Elsevier Interactive Patient Education 2016 [place]. Esophagogastroduodenoscopy Esophagogastroduodenoscopy (EGD) is a procedure that is used to examine the lining of the esophagus, stomach, and first part  of the small intestine (duodenum). A long, flexible, lighted tube with a camera attached (endoscope) is inserted down the throat to view these organs. This procedure is done to detect problems or abnormalities, such as inflammation, bleeding, ulcers, or growths, in order to treat them. The procedure lasts 5-20 minutes. It is usually an outpatient procedure, but it may need to be performed in a hospital in emergency cases. LET [facility] CARE PROVIDER KNOW ABOUT:  Any allergies you have.  All medicines you are taking, including vitamins, herbs, eye drops, creams, and over-the-counter medicines.  Previous problems you or members of your family have had with the use of anesthetics.  Any blood disorders you have.  Previous surgeries you have had.  Medical conditions you have. RISKS AND COMPLICATIONS Generally, this is a safe procedure. However, problems can occur and  include:  Infection.  Bleeding.  Tearing (perforation) of the esophagus, stomach, or duodenum.  Difficulty breathing or not being able to breathe.  Excessive sweating.  Spasms of the larynx.  Slowed heartbeat.  Low blood pressure. BEFORE THE PROCEDURE  Do not eat or drink anything after midnight on the night before the procedure or as directed by your health care provider.  Do not take your regular medicines before the procedure if your health care provider asks you not to. Ask your health care provider about changing or stopping those medicines.  If you wear dentures, be prepared to remove them before the procedure.  Arrange for someone to drive you home after the procedure. PROCEDURE  A numbing medicine (local anesthetic) may be sprayed in your throat for comfort and to stop you from gagging or coughing.  You will have an IV tube inserted in a vein in your hand or arm. You will receive medicines and fluids through this tube.  You will be given a medicine to relax you (sedative).  A pain reliever will be given through the IV tube.  A mouth guard may be placed in your mouth to protect your teeth and to keep you from biting on the endoscope.  You will be asked to lie on your left side.  The endoscope will be inserted down your throat and into your esophagus, stomach, and duodenum.  Air will be put through the endoscope to allow your health care provider to clearly view the lining of your esophagus.  The lining of your esophagus, stomach, and duodenum will be examined. During the exam, your health care provider may:  Remove tissue to be examined under a microscope (biopsy) for inflammation, infection, or other medical problems.  Remove growths.  Remove objects (foreign bodies) that are stuck.  Treat any bleeding with medicines or other devices that stop tissues from bleeding (hot cautery, clipping devices).  Widen (dilate) or stretch narrowed areas of your  esophagus and stomach.  The endoscope will be withdrawn. AFTER THE PROCEDURE  You will be taken to a recovery area for observation. Your blood pressure, heart rate, breathing rate, and blood oxygen level will be monitored often until the medicines you were given have worn off.  Do not eat or drink anything until the numbing medicine has worn off and your gag reflex has returned. You may choke.  Your health care provider should be able to discuss his or her findings with you. It will take longer to discuss the test results if any biopsies were taken.   This information is not intended to replace advice given to you by your health care provider. Make sure  you discuss any questions you have with your health care provider.   Document Released: 06/26/2004 Document Revised: 03/16/2014 Document Reviewed: 01/27/2012 Elsevier Interactive Patient Education 2016 [place]. Esophagogastroduodenoscopy, Care After Refer to this sheet in the next few weeks. These instructions provide you with information about caring for yourself after your procedure. Your health care provider may also give you more specific instructions. Your treatment has been planned according to current medical practices, but problems sometimes occur. Call your health care provider if you have any problems or questions after your procedure. WHAT TO EXPECT AFTER THE PROCEDURE After your procedure, it is typical to feel:  Soreness in your throat.  Pain with swallowing.  Sick to your stomach (nauseous).  Bloated.  Dizzy.  Fatigued. HOME CARE INSTRUCTIONS  Do not eat or drink anything until the numbing medicine (local anesthetic) has worn off and your gag reflex has returned. You will know that the local anesthetic has worn off when you can swallow comfortably.  Do not drive or operate machinery until directed by your health care provider.  Take medicines only as directed by your health care provider. SEEK MEDICAL CARE IF:    You cannot stop coughing.  You are not urinating at all or less than usual. SEEK IMMEDIATE MEDICAL CARE IF:  You have difficulty swallowing.  You cannot eat or drink.  You have worsening throat or chest pain.  You have dizziness or lightheadedness or you faint.  You have nausea or vomiting.  You have chills.  You have a fever.  You have severe abdominal pain.  You have black, tarry, or bloody stools.   This information is not intended to replace advice given to you by your health care provider. Make sure you discuss any questions you have with your health care provider.   Document Released: 02/10/2012 Document Revised: 03/16/2014 Document Reviewed: 02/10/2012 Elsevier Interactive Patient Education 2016 [place] Monitored anesthesia care is an anesthesia service for a medical procedure. Anesthesia is the loss of the ability to feel pain. It is produced by medicines called anesthetics. It may affect a small area of your body (local anesthesia), a large area of your body (regional anesthesia), or your entire body (general anesthesia). The need for monitored anesthesia care depends your procedure, your condition, and the potential need for regional or general anesthesia. It is often provided during procedures where:   General anesthesia may be needed if there are complications. This is because you need special care when you are under general anesthesia.   You will be under local or regional anesthesia. This is so that you are able to have higher levels of anesthesia if needed.   You will receive calming medicines (sedatives). This is especially the case if sedatives are given to put you in a semi-conscious state of relaxation (deep sedation). This is because the amount of sedative needed to produce this state can be hard to predict. Too much of a sedative can produce general anesthesia. Monitored anesthesia care is performed by one or more health  care providers who have special training in all types of anesthesia. You will need to meet with these health care providers before your procedure. During this meeting, they will ask you about your medical history. They will also give you instructions to follow. (For example, you will need to stop eating and drinking before your procedure. You may also need to stop or change medicines you are taking.) During your procedure, your health care providers will  stay with you. They will:   Watch your condition. This includes watching your blood pressure, breathing, and level of pain.   Diagnose and treat problems that occur.   Give medicines if they are needed. These may include calming medicines (sedatives) and anesthetics.   Make sure you are comfortable.  Having monitored anesthesia care does not necessarily mean that you will be under anesthesia. It does mean that your health care providers will be able to manage anesthesia if you need it or if it occurs. It also means that you will be able to have a different type of anesthesia than you are having if you need it. When your procedure is complete, your health care providers will continue to watch your condition. They will make sure any medicines wear off before you are allowed to go home.    This information is not intended to replace advice given to you by your health care provider. Make sure you discuss any questions you have with your health care provider.   Document Released: 11/19/2004 Document Revised: 03/16/2014 Document Reviewed: 04/06/2012 Elsevier Interactive Patient Education 2016 [employer] PATIENT INSTRUCTIONS POST-ANESTHESIA  IMMEDIATELY FOLLOWING SURGERY:  Do not drive or operate machinery for the first twenty four hours after surgery.  Do not make any important decisions for twenty four hours after surgery or while taking narcotic pain medications or sedatives.  If you develop intractable nausea and vomiting or a severe headache  please notify your doctor immediately.  FOLLOW-UP:  Please make an appointment with your surgeon as instructed. You do not need to follow up with anesthesia unless specifically instructed to do so.  WOUND CARE INSTRUCTIONS (if applicable):  Keep a dry clean dressing on the anesthesia/puncture wound site if there is drainage.  Once the wound has quit draining you may leave it open to air.  Generally you should leave the bandage intact for twenty four hours unless there is drainage.  If the epidural site drains for more than 36-48 hours please call the anesthesia department.  QUESTIONS?:  Please feel free to call your physician or the hospital operator if you have any questions, and they will be happy to assist you.

## 2015-10-03 ENCOUNTER — Encounter (HOSPITAL_COMMUNITY)
Admission: RE | Admit: 2015-10-03 | Discharge: 2015-10-03 | Disposition: A | Discharge status: Home / Self Care | Payer: Medicare Other | Source: Ambulatory Visit | Attending: Gastroenterology | Admitting: Gastroenterology

## 2015-10-03 ENCOUNTER — Encounter (HOSPITAL_COMMUNITY): Payer: Self-pay

## 2015-10-08 ENCOUNTER — Encounter (HOSPITAL_COMMUNITY): Admission: RE | Payer: Self-pay | Source: Ambulatory Visit

## 2015-10-08 ENCOUNTER — Ambulatory Visit (HOSPITAL_COMMUNITY): Admission: RE | Admit: 2015-10-08 | Payer: Medicare Other | Source: Ambulatory Visit | Admitting: Gastroenterology

## 2015-10-08 SURGERY — COLONOSCOPY WITH PROPOFOL
Anesthesia: Monitor Anesthesia Care

## 2015-10-31 ENCOUNTER — Ambulatory Visit: Payer: Medicare Other | Admitting: Internal Medicine

## 2015-10-31 DIAGNOSIS — Z4782 Encounter for orthopedic aftercare following scoliosis surgery: Secondary | ICD-10-CM | POA: Diagnosis not present

## 2015-11-01 ENCOUNTER — Ambulatory Visit: Payer: Medicare Other | Admitting: Internal Medicine

## 2015-11-08 ENCOUNTER — Other Ambulatory Visit: Payer: Self-pay

## 2015-11-22 ENCOUNTER — Encounter: Payer: Self-pay | Admitting: Internal Medicine

## 2015-11-22 ENCOUNTER — Ambulatory Visit (INDEPENDENT_AMBULATORY_CARE_PROVIDER_SITE_OTHER): Payer: Medicare Other | Admitting: Internal Medicine

## 2015-11-22 VITALS — BP 114/60 | HR 74 | Ht 62.0 in | Wt 124.0 lb

## 2015-11-22 DIAGNOSIS — J4520 Mild intermittent asthma, uncomplicated: Secondary | ICD-10-CM

## 2015-11-22 DIAGNOSIS — Z23 Encounter for immunization: Secondary | ICD-10-CM | POA: Diagnosis not present

## 2015-11-22 DIAGNOSIS — R911 Solitary pulmonary nodule: Secondary | ICD-10-CM

## 2015-11-22 MED ORDER — FLUTICASONE FUROATE 100 MCG/ACT IN AEPB: 1.0000 | INHALATION_SPRAY | Freq: Every day | RESPIRATORY_TRACT | 3 refills | Status: DC
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
## 2015-11-22 NOTE — Patient Instructions (Signed)
ICD-9-CM ICD-10-CM   1. Asthma, mild intermittent, uncomplicated 123456 [id number]   2. Lung nodule 793.11 R91.1     Symptoms appear active   Plan - start ARNUITY daily - flu shot 11/22/2015 - CT chest wo contrast for history of lung nodules -  Probability of cancer is very low  - can do at [place]  Followup - 3 months to see response   - can consider feno at followup

## 2015-11-29 ENCOUNTER — Encounter: Payer: Self-pay | Admitting: Family Medicine

## 2015-11-29 ENCOUNTER — Ambulatory Visit (INDEPENDENT_AMBULATORY_CARE_PROVIDER_SITE_OTHER): Payer: Medicare Other | Admitting: Family Medicine

## 2015-11-29 VITALS — BP 130/70 | Ht 61.25 in | Wt 123.5 lb

## 2015-11-29 DIAGNOSIS — Z79891 Long term (current) use of opiate analgesic: Secondary | ICD-10-CM

## 2015-11-29 MED ORDER — HYDROCODONE-ACETAMINOPHEN 10-325 MG PO TABS: ORAL_TABLET | ORAL | 0 refills | Status: DC

## 2015-11-29 NOTE — Progress Notes (Signed)
   Subjective:    Patient ID: Jenna Contreras, female    DOB: 1945/03/22, 70 y.o.   MRN: [id number]  HPI This patient was seen today for chronic pain  The medication list was reviewed and updated.   -Compliance with medication: yes   - Number patient states they take daily: 3 daily   -when was the last dose patient took: today   The patient was advised the importance of maintaining medication and not using illegal substances with these.  Refills needed: yes`  The patient was educated that we can provide 3 monthly scripts for their medication, it is their responsibility to follow the instructions.  Side effects or complications from medications: yes  Patient is aware that pain medications are meant to minimize the severity of the pain to allow their pain levels to improve to allow for better function. They are aware of that pain medications cannot totally remove their pain.  Due for UDT ( at least once per year): no   Patient states that she has no other concerns at this time.      Review of Systems  Constitutional: Negative for activity change and appetite change.  Gastrointestinal: Negative for abdominal pain and vomiting.  Neurological: Negative for weakness.  Psychiatric/Behavioral: Negative for confusion.       Objective:   Physical Exam  Constitutional: She appears well-nourished. No distress.  HENT:  Head: Normocephalic.  Cardiovascular: Normal rate, regular rhythm and normal heart sounds.   No murmur heard. Pulmonary/Chest: Effort normal and breath sounds normal.  Musculoskeletal: She exhibits no edema.  Lymphadenopathy:    She has no cervical adenopathy.  Neurological: She is alert.  Psychiatric: Her behavior is normal.  Vitals reviewed.         Assessment & Plan:  The patient was seen today as part of a comprehensive visit regarding pain control. Patient's compliance with the medication as well as discussion regarding effectiveness was  completed. Prescriptions were written. Patient was advised to follow-up in 3 months. The patient was assessed for any signs of severe side effects. The patient was advised to take the medicine as directed and to report to [place] if any side effect issues. Patient actually doing much better continue current medications. Follow-up in several months

## 2015-12-10 ENCOUNTER — Ambulatory Visit (HOSPITAL_COMMUNITY)
Admission: RE | Admit: 2015-12-10 | Discharge: 2015-12-10 | Disposition: A | Discharge status: Home / Self Care | Payer: Medicare Other | Source: Ambulatory Visit | Attending: Internal Medicine | Admitting: Internal Medicine

## 2015-12-10 DIAGNOSIS — R911 Solitary pulmonary nodule: Secondary | ICD-10-CM

## 2015-12-10 DIAGNOSIS — I313 Pericardial effusion (noninflammatory): Secondary | ICD-10-CM | POA: Insufficient documentation

## 2015-12-10 DIAGNOSIS — R918 Other nonspecific abnormal finding of lung field: Secondary | ICD-10-CM | POA: Diagnosis not present

## 2015-12-10 DIAGNOSIS — I700 Atherosclerosis of aorta: Secondary | ICD-10-CM | POA: Diagnosis not present

## 2015-12-10 DIAGNOSIS — F3131 Bipolar disorder, current episode depressed, mild: Secondary | ICD-10-CM | POA: Diagnosis not present

## 2015-12-10 DIAGNOSIS — I2510 Atherosclerotic heart disease of native coronary artery without angina pectoris: Secondary | ICD-10-CM | POA: Insufficient documentation

## 2015-12-13 ENCOUNTER — Telehealth: Payer: Self-pay | Admitting: Internal Medicine

## 2015-12-13 NOTE — Telephone Encounter (Signed)
Old 85mm nodule stable since 2015. Now there are new x 2 x 58mm nodules in lingula and LLL. This is NOT cancer but to be 100% sure please order repeat CT chest wo contrast in 1 year. Please let Anshika Britton-Watkins know   Thanks  Dr. Brand Males, M.D., [facility].C.P Pulmonary and Critical Care Medicine Staff Physician [place] [facility] Pager: [phone], If no answer or between  15:00h - 7:00h: call [phone]  12/13/2015 9:58 AM      Ct Chest Wo Contrast  Result Date: 12/10/2015 CLINICAL DATA:  Follow-up pulmonary nodule. No current chest symptoms are reported. EXAM: CT CHEST WITHOUT CONTRAST TECHNIQUE: Multidetector CT imaging of the chest was performed following the standard protocol without IV contrast. COMPARISON:  02/09/2014 chest CT. FINDINGS: Cardiovascular: Normal heart size. Stable trace pericardial effusion/ thickening. Left anterior descending and right coronary atherosclerosis. Atherosclerotic nonaneurysmal thoracic aorta. Normal caliber pulmonary arteries. Mediastinum/Nodes: No discrete thyroid nodules. Unremarkable esophagus. No pathologically enlarged axillary, mediastinal or gross hilar lymph nodes, noting limited sensitivity for the detection of hilar adenopathy on this noncontrast study. Lungs/Pleura: No pneumothorax. No pleural effusion. Five previously described scattered pulmonary nodules in both lungs are stable back to 02/09/2014 and considered benign, largest 3 mm in the right upper lobe (series 3/ image 61). New 3 mm peripheral lingular pulmonary nodule (series 3/ image 96). New 3 mm pulmonary nodule in the medial basilar left lower lobe (series 3/ image 122). No acute consolidative airspace disease, lung masses or additional significant pulmonary nodules. Upper abdomen: Unremarkable. Musculoskeletal: No aggressive appearing focal osseous lesions. Mild thoracic spondylosis. Partially visualized bilateral posterior spinal fusion  hardware extending inferiorly from T4 into the lumbar spine. Intact appearing bilateral breast prostheses. IMPRESSION: 1. Previously described tiny scattered pulmonary nodules measuring up to 3 mm in the right upper lobe are all stable back to 02/09/2014 and considered benign. 2. Two new 3 mm pulmonary nodules in the lingula and left lower lobe. No follow-up needed if patient is low-risk (and has no known or suspected primary neoplasm). Non-contrast chest CT can be considered in 12 months if patient is high-risk. This recommendation follows the consensus statement: Guidelines for Management of Incidental Pulmonary Nodules Detected on CT Images: From the Fleischner Society 2017; Radiology 2017; 284:228-243. 3. Additional findings include aortic atherosclerosis, 2 vessel coronary atherosclerosis and stable trace pericardial effusion/thickening. Electronically Signed   By: Ilona Sorrel M.D.   On: 12/10/2015 12:43

## 2015-12-13 NOTE — Telephone Encounter (Signed)
lmtcb for pt.  

## 2015-12-16 NOTE — Telephone Encounter (Signed)
lmtcb for pt.  

## 2015-12-17 ENCOUNTER — Encounter: Payer: Self-pay | Admitting: Emergency Medicine

## 2015-12-17 NOTE — Telephone Encounter (Signed)
lmtcb for pt. Will send to letter to pt as there have been several unsuccessful attempts to reach pt. Will sign off.

## 2015-12-31 ENCOUNTER — Telehealth: Payer: Self-pay | Admitting: Internal Medicine

## 2015-12-31 MED ORDER — ALBUTEROL SULFATE HFA 108 (90 BASE) MCG/ACT IN AERS: 2.0000 | INHALATION_SPRAY | RESPIRATORY_TRACT | 3 refills | Status: DC | PRN

## 2015-12-31 MED ORDER — FLUTICASONE FUROATE 100 MCG/ACT IN AEPB
1.0000 | INHALATION_SPRAY | Freq: Every day | RESPIRATORY_TRACT | 3 refills | Status: DC
Start: 2015-12-31 — End: 2016-02-26

## 2015-12-31 NOTE — Telephone Encounter (Signed)
Called and spoke with pt and she is aware of [facility]  She is aware of ct results as well. Nothing further is needed.

## 2016-01-16 DIAGNOSIS — F3131 Bipolar disorder, current episode depressed, mild: Secondary | ICD-10-CM | POA: Diagnosis not present

## 2016-02-06 ENCOUNTER — Ambulatory Visit (INDEPENDENT_AMBULATORY_CARE_PROVIDER_SITE_OTHER): Payer: Medicare Other | Admitting: Family Medicine

## 2016-02-06 ENCOUNTER — Encounter: Payer: Self-pay | Admitting: Family Medicine

## 2016-02-06 VITALS — BP 116/72 | Temp 97.7°F | Ht 63.0 in | Wt 124.0 lb

## 2016-02-06 DIAGNOSIS — B9689 Other specified bacterial agents as the cause of diseases classified elsewhere: Secondary | ICD-10-CM | POA: Diagnosis not present

## 2016-02-06 DIAGNOSIS — E784 Other hyperlipidemia: Secondary | ICD-10-CM | POA: Diagnosis not present

## 2016-02-06 DIAGNOSIS — Z79899 Other long term (current) drug therapy: Secondary | ICD-10-CM | POA: Diagnosis not present

## 2016-02-06 DIAGNOSIS — J019 Acute sinusitis, unspecified: Secondary | ICD-10-CM

## 2016-02-06 DIAGNOSIS — E7849 Other hyperlipidemia: Secondary | ICD-10-CM

## 2016-02-06 DIAGNOSIS — E038 Other specified hypothyroidism: Secondary | ICD-10-CM | POA: Diagnosis not present

## 2016-02-06 MED ORDER — CLINDAMYCIN HCL 300 MG PO CAPS: 300.0000 mg | ORAL_CAPSULE | Freq: Three times a day (TID) | ORAL | 0 refills | Status: DC

## 2016-02-06 NOTE — Progress Notes (Signed)
   Subjective:    Patient ID: Jenna Contreras, female    DOB: [birth year], 70 y.o.   MRN: [id number]  HPICat bit took about one month to heal. Later a deep scratch. Took 3 weeks to heal. Sick for over 2 weeks. Coughing, runny nose, headache.  Patient has been having coughing congestion drainage. States this been going on intermittently over the past few days  His had a Bite with scratch is took several weeks to heal she is had some joint pains and discomfort she is had previous joint replacements as well as spinal surgery   Review of Systems She denies high fever chills sweats nausea vomiting diarrhea    Objective:   Physical Exam Lungs clear hearts regular spine stiff anticipated with surgery HEENT benign   Lab work before next chronic health is    Assessment & Plan:  Viral syndrome should heal up on its own Recent Related scratches I do not feel she has cat scratch disease. I recommend prescription of clindamycin that she can use if she starts having By her infected related Scratch follow-up if ongoing troubles

## 2016-02-10 DIAGNOSIS — N6342 Unspecified lump in left breast, subareolar: Secondary | ICD-10-CM | POA: Diagnosis not present

## 2016-02-10 DIAGNOSIS — Z1151 Encounter for screening for human papillomavirus (HPV): Secondary | ICD-10-CM | POA: Diagnosis not present

## 2016-02-10 DIAGNOSIS — Z124 Encounter for screening for malignant neoplasm of cervix: Secondary | ICD-10-CM | POA: Diagnosis not present

## 2016-02-26 ENCOUNTER — Ambulatory Visit (INDEPENDENT_AMBULATORY_CARE_PROVIDER_SITE_OTHER): Payer: Medicare Other | Admitting: Internal Medicine

## 2016-02-26 ENCOUNTER — Encounter: Payer: Self-pay | Admitting: Internal Medicine

## 2016-02-26 VITALS — BP 104/58 | HR 61 | Ht 63.0 in | Wt 124.4 lb

## 2016-02-26 DIAGNOSIS — R911 Solitary pulmonary nodule: Secondary | ICD-10-CM

## 2016-02-26 DIAGNOSIS — J4530 Mild persistent asthma, uncomplicated: Secondary | ICD-10-CM | POA: Diagnosis not present

## 2016-02-26 MED ORDER — FLUTICASONE FUROATE 100 MCG/ACT IN AEPB: 1.0000 | INHALATION_SPRAY | Freq: Every day | RESPIRATORY_TRACT | 0 refills | Status: DC

## 2016-02-26 MED ORDER — ALBUTEROL SULFATE HFA 108 (90 BASE) MCG/ACT IN AERS: 2.0000 | INHALATION_SPRAY | RESPIRATORY_TRACT | 3 refills | Status: DC | PRN

## 2016-02-26 MED ORDER — FLUTICASONE FUROATE 100 MCG/ACT IN AEPB: 1.0000 | INHALATION_SPRAY | Freq: Every day | RESPIRATORY_TRACT | 3 refills | Status: DC

## 2016-02-26 NOTE — Progress Notes (Signed)
Subjective:     Patient ID: Jenna Contreras, female   DOB: 1945/07/07, 70 y.o.   MRN: [id number]  HPI    OV 11/22/2015  Chief Complaint  Patient presents with  . Follow-up    Pt states she feels her breathing has worsened since last OV in 10/2014. Pt c/o increase in SOB with activity, non prod cough with chest congestion and occasional chest tightness. . Pt states she has had an increase in "asthma attacks".      Jenna Contreras 70 y.o. female - Last seen August 2016 for mild intermittent asthma. We got her off inhale steroids. She only has 4 mm pulmonary nodules last seen December 2015 and was stable since 2012. In the interim she's had major spinal surgery for scoliosis and had a rough postoperative course. She still dealing with significant myofascial spasm because of surgical incisions. At this point in time for the last few months her cough has returned. She feels her asthma is active. In addition she is asking me to repeat CT chest 4 mm nodule which are deemed stable for over 3 years. She is extremely anxious about the nodules being cancer. In fact she tells me that her daughter is asked me to biopsy this 4 mm nodules. She is very keen on getting the CT chest.  OV 02/26/2016  Chief Complaint  Patient presents with  . Follow-up    Pt states she is not on the Arnuity because she did not receive it from her pharmacy. Pt states when she was taking Arnuity she noticed a significant imporvement. Pt states she had a bad "asthma attack" on 12.19.17. Pt denies f/c/s.    Jenna Contreras returns for follow-up of asthma and lung nodules   Last visit started on inhaled fluticasone ARNUITY she says it made a big difference for her. She says it has the biggest impact in her life in terms of her breathing. However [facility] did not [facility] this medication and she is indicated. She is now run out of the medication. Last night she had an asthma flare but is back to baseline. She  says she almost went to the emergency room.    In terms of lung nodules:CT chest October 2017: Old 50m nodule stable since 2015. Now there are new x 2 x 392mnodules in lingula and LLL. This is NOT cancer but to be 100% sure please order repeat CT chest wo contrast in 1 year. Please let [name] know   Past medical history: She continues to deal with back pain even after back surgery. She inquired about alternative forms of treatment suggest chiropractor, dry needling, acupuncture. I gave her some names locally.    has a past medical history of Anxiety; Arthritis; Arthritis; Asthma; Bipolar 2 disorder ([facility] ([facility] Constipation; Depression; Fibromyalgia; GERD (gastroesophageal reflux disease); Heart murmur; History of gout; History of skin cancer; HNP (herniated nucleus pulposus with myelopathy), thoracic; Hypothyroidism; Left eye injury; Low BP; Migraines; OCD (obsessive compulsive disorder); Osteoporosis; Psoriasis; and Scoliosis.   reports that she quit smoking about 36 years ago. Her smoking use included Cigarettes. She has a 38.00 pack-year smoking history. She has never used smokeless tobacco.  Past Surgical History:  Procedure Laterality Date  . Arthroscopic left knee surgery    . DILATION AND CURETTAGE OF UTERUS  x3  . HIP SURGERY Right 2015  . iliac wings    . KNEE SURGERY Bilateral 2015, 2016  . MANDIBLE SURGERY     X 2  .  PARTIAL KNEE ARTHROPLASTY Left 02/12/2014   Procedure: LEFT KNEE UNICOMPARTMENTAL MEDIALLY ARTHROPLASTY ;  Surgeon: Shelda Pal, MD;  Location: [facility];  Service: Orthopedics;  Laterality: Left;  . PARTIAL KNEE ARTHROPLASTY Right 07/02/2014   Procedure: RIGHT UNI KNEE ARTHOPLASTY MEDIALLY;  Surgeon: Durene Romans, MD;  Location: [facility];  Service: Orthopedics;  Laterality: Right;  . rod in spine     scoliosis   . TONSILLECTOMY    . TOTAL HIP ARTHROPLASTY Right 05/09/2013   Procedure: RIGHT TOTAL HIP ARTHROPLASTY ANTERIOR APPROACH;  Surgeon:  Shelda Pal, MD;  Location: [facility];  Service: Orthopedics;  Laterality: Right;  . TUBAL LIGATION    . WRIST SURGERY      Allergies  Allergen Reactions  . Prednisone Other (See Comments)    Suicidal ideation  . Adhesive [Tape]     Pulls her skin off  . Betadine [Povidone Iodine] Other (See Comments)    Sets her on fire  . Demerol [Meperidine] Nausea And Vomiting  . Latex Other (See Comments)    Irritates her skin  . Levaquin [Levofloxacin In D5w]     Painful joints   . Silvadene [Silver Sulfadiazine]   . Ampicillin Rash  . Cephalexin Itching and Rash  . Doxycycline Itching and Rash  . Erythromycin Rash  . Keflex [Cephalexin] Rash  . Penicillins Itching and Rash  . Tetracyclines & Related Itching and Rash    Immunization History  Administered Date(s) Administered  . Influenza Split 03/13/2011, 11/18/2011, 02/24/2013  . Influenza,inj,Quad PF,36+ Mos 01/18/2014, 02/18/2015, 11/22/2015  . Influenza-Unspecified 12/08/2011  . Pneumococcal Conjugate-13 01/18/2014  . Pneumococcal Polysaccharide-23 03/13/2011  . Zoster 01/28/2012    Family History  Problem Relation Age of Onset  . Heart disease Mother   . Diabetes Mother   . Colon cancer Father 77  . Heart disease Brother      Current Outpatient Prescriptions:  .  albuterol (PROVENTIL HFA;VENTOLIN HFA) 108 (90 Base) MCG/ACT inhaler, Inhale 2 puffs into the lungs every 4 (four) hours as needed for wheezing or shortness of breath., Disp: 3 Inhaler, Rfl: 3 .  Artificial Tear Ointment (REFRESH P.M. OP), Apply 1 application to eye at bedtime., Disp: , Rfl:  .  chlorpheniramine (CHLOR-TRIMETON) 4 MG tablet, Take 4 mg by mouth 2 (two) times daily as needed for allergies. Reported on 05/20/2015, Disp: , Rfl:  .  Cholecalciferol (VITAMIN D) 2000 UNITS CAPS, Take by mouth daily. Reported on 09/18/2015, Disp: , Rfl:  .  clindamycin (CLEOCIN) 300 MG capsule, Take 1 capsule (300 mg total) by mouth 3 (three) times daily., Disp: 21  capsule, Rfl: 0 .  escitalopram (LEXAPRO) 10 MG tablet, Take 10 mg by mouth daily., Disp: , Rfl:  .  fluticasone (FLONASE) 50 MCG/ACT nasal spray, Place 1 spray into both nostrils 2 (two) times daily., Disp: 16 g, Rfl: 1 .  HYDROcodone-acetaminophen (NORCO) 10-325 MG tablet, One tid prn, Disp: 90 tablet, Rfl: 0 .  lamoTRIgine (LAMICTAL) 100 MG tablet, Take 1 tablet (100 mg total) by mouth 2 (two) times daily., Disp: 180 tablet, Rfl: 2 .  levothyroxine (SYNTHROID, LEVOTHROID) 88 MCG tablet, Take 1 tablet (88 mcg total) by mouth daily before breakfast., Disp: 90 tablet, Rfl: 1 .  LORazepam (ATIVAN) 0.5 MG tablet, TAKE 1 TABLET BY MOUTH TWICE A DAY AS NEEDED FOR ANXIETY (Patient taking differently: TAKE 1 TABLET BY MOUTH  THREE TIMES A DAY AS NEEDED FOR ANXIETY), Disp: 35 tablet, Rfl: 1 .  MAXALT 10 MG  tablet, TAKE 1 TABLET ONCE AS NEEDED FOR MIGRAINE. MAY REPEAT IN 2 HOURS IF NEEDED, NO GREATER THAN 4 TABLETS PER WEEK (MUST LAST 90 DAYS), Disp: 48 tablet, Rfl: 3 .  mometasone (ELOCON) 0.1 % cream, Apply 1 application topically 2 (two) times daily as needed., Disp: 135 g, Rfl: 2 .  Na Sulfate-K Sulfate-Mg Sulf (SUPREP BOWEL PREP KIT) 17.5-3.13-1.6 GM/180ML SOLN, Take 1 kit by mouth as directed., Disp: 1 Bottle, Rfl: 0 .  NON FORMULARY, MAGNESIUM  3 TABLETS DAILY ( ? STRENTH), Disp: , Rfl:  .  ondansetron (ZOFRAN) 4 MG tablet, Take 1 tablet (4 mg total) by mouth every 8 (eight) hours as needed for nausea or vomiting., Disp: 30 tablet, Rfl: 2 .  pantoprazole (PROTONIX) 40 MG tablet, Take 1 tablet (40 mg total) by mouth daily., Disp: 90 tablet, Rfl: 1 .  Polyvinyl Alcohol-Povidone (REFRESH OP), Apply 1 drop to eye 2 (two) times daily as needed (Dry eyes). Reported on 05/20/2015, Disp: , Rfl:  .  traZODone (DESYREL) 50 MG tablet, Take 1 1/2 tablet qhs, Disp: 135 tablet, Rfl: 2 .  Fluticasone Furoate (ARNUITY ELLIPTA) 100 MCG/ACT AEPB, Inhale 1 puff into the lungs daily. (Patient not taking: Reported on  02/26/2016), Disp: 90 each, Rfl: 3    Review of Systems     Objective:   Physical Exam  Constitutional: She is oriented to person, place, and time. She appears well-developed and well-nourished. No distress.  HENT:  Head: Normocephalic and atraumatic.  Right Ear: External ear normal.  Left Ear: External ear normal.  Mouth/Throat: Oropharynx is clear and moist. No oropharyngeal exudate.  Eyes: Conjunctivae and EOM are normal. Pupils are equal, round, and reactive to light. Right eye exhibits no discharge. Left eye exhibits no discharge. No scleral icterus.  Neck: Normal range of motion. Neck supple. No JVD present. No tracheal deviation present. No thyromegaly present.  Cardiovascular: Normal rate, regular rhythm, normal heart sounds and intact distal pulses.  Exam reveals no gallop and no friction rub.   No murmur heard. Pulmonary/Chest: Effort normal and breath sounds normal. No respiratory distress. She has no wheezes. She has no rales. She exhibits no tenderness.  Abdominal: Soft. Bowel sounds are normal. She exhibits no distension and no mass. There is no tenderness. There is no rebound and no guarding.  Musculoskeletal: Normal range of motion. She exhibits no edema or tenderness.  Lymphadenopathy:    She has no cervical adenopathy.  Neurological: She is alert and oriented to person, place, and time. She has normal reflexes. No cranial nerve deficit. She exhibits normal muscle tone. Coordination normal.  Skin: Skin is warm and dry. No rash noted. She is not diaphoretic. No erythema. No pallor.  Psychiatric: She has a normal mood and affect. Her behavior is normal. Judgment and thought content normal.  Vitals reviewed.    Vitals:   02/26/16 1157  BP: (!) 104/58  Pulse: 61  SpO2: 98%  Weight: 124 lb 6.4 oz (56.4 kg)  Height: '5\' 3"'$  (1.6 m)        Assessment:       ICD-9-CM ICD-10-CM   1. Mild persistent asthma, unspecified whether complicated 542.70 W23.76   2. Lung  nodule 793.11 R91.1 CT Chest Wo Contrast       Plan:      #asthma - glad arnuity helped a lot  - contnue t ARNUITY daily  - take samples  - will investigate why there was dely  #Lung nodule  - repeat  ct chest wo contrast in 1 year  Followup - 6 months or sooner iif needed  Dr. Brand Males, M.D., [facility].C.P Pulmonary and Critical Care Medicine Staff Physician [facility] Pager: [phone], If no answer or between  15:00h - 7:00h: call [phone]  02/26/2016 12:59 PM

## 2016-02-26 NOTE — Patient Instructions (Addendum)
ICD-9-CM ICD-10-CM   1. Asthma, mild intermittent, uncomplicated 123456 [id number]   2. Lung nodule 793.11 R91.1     #asthma - glad arnuity helped a lot  - contnue t ARNUITY daily  - take samples  - will investigate why there was dely  #Lung nodule  - repeat ct chest wo contrast in 1 year  Followup - 6 months or sooner iif needed

## 2016-02-28 ENCOUNTER — Ambulatory Visit: Payer: Medicare Other | Admitting: Family Medicine

## 2016-03-05 ENCOUNTER — Ambulatory Visit (INDEPENDENT_AMBULATORY_CARE_PROVIDER_SITE_OTHER): Payer: Medicare Other | Admitting: Family Medicine

## 2016-03-05 ENCOUNTER — Encounter: Payer: Self-pay | Admitting: Family Medicine

## 2016-03-05 ENCOUNTER — Telehealth: Payer: Self-pay | Admitting: Family Medicine

## 2016-03-05 VITALS — BP 122/82 | Ht 63.0 in | Wt 128.2 lb

## 2016-03-05 DIAGNOSIS — M549 Dorsalgia, unspecified: Secondary | ICD-10-CM

## 2016-03-05 DIAGNOSIS — E038 Other specified hypothyroidism: Secondary | ICD-10-CM | POA: Diagnosis not present

## 2016-03-05 MED ORDER — GABAPENTIN 100 MG PO CAPS: 100.0000 mg | ORAL_CAPSULE | Freq: Three times a day (TID) | ORAL | 1 refills | Status: DC

## 2016-03-05 MED ORDER — HYDROCODONE-ACETAMINOPHEN 10-325 MG PO TABS
ORAL_TABLET | ORAL | 0 refills | Status: DC
Start: 2016-03-05 — End: 2016-03-05

## 2016-03-05 MED ORDER — HYDROCODONE-ACETAMINOPHEN 10-325 MG PO TABS: ORAL_TABLET | ORAL | 0 refills | Status: DC

## 2016-03-05 MED ORDER — CHLORZOXAZONE 500 MG PO TABS
500.0000 mg | ORAL_TABLET | Freq: Three times a day (TID) | ORAL | 4 refills | Status: DC | PRN
Start: 2016-03-05 — End: 2016-05-28

## 2016-03-05 NOTE — Progress Notes (Signed)
   Subjective:    Patient ID: Jenna Contreras, female    DOB: 05-02-1945, 70 y.o.   MRN: [id number]  HPI This patient was seen today for chronic pain  The medication list was reviewed and updated.   -Compliance with medication: yes  - Number patient states they take daily: 3 a day  -when was the last dose patient took? today  The patient was advised the importance of maintaining medication and not using illegal substances with these.  Refills needed: yes  The patient was educated that we can provide 3 monthly scripts for their medication, it is their responsibility to follow the instructions.  Side effects or complications from medications: none  Patient is aware that pain medications are meant to minimize the severity of the pain to allow their pain levels to improve to allow for better function. They are aware of that pain medications cannot totally remove their pain.  Due for UDT ( at least once per year) : 08/2015 She relates she does take her pain medicine 3 times daily denies abusing it She does use trazodone at nighttime to help her with rest and sleep She states her moods overall been stable    She relates taking her pain medicine denies a causing any difficulty with her.    Review of Systems Patient denies any chest tightness pressure pain she does relate back pain and discomfort have any use medication to help with this. Denies abdominal symptoms.    Objective:   Physical Exam  Lungs are clear no crackles heart regular pulse normal BP is good subjective discomfort in her back limited range of motion because of previous surgery Patient does state she takes a thyroid medicine as directed denies missing it She relates she takes her reflux medicine she is under good control with this She states she saw different specialists the one who did her surgery who recommended gabapentin to try to see if that might help her with her pain     Assessment & Plan:  The  patient was seen today as part of a comprehensive visit regarding pain control. Patient's compliance with the medication as well as discussion regarding effectiveness was completed. Prescriptions were written. Patient was advised to follow-up in 3 months. The patient was assessed for any signs of severe side effects. The patient was advised to take the medicine as directed and to report to [place] if any side effect issues. Possibly physical therapy will help her. Referral for physical therapy. Recommend to minimize muscle relaxer uses much is possible Tried gabapentin as an alternative to help with pain 100 mg slowly titrate up to 1 taken 3 times daily Patient brings up multiple other issues 25 minutes spent with patient greater than half discussing multiple different questions related to her back pain previous surgery Patient states she will get her lab work completed

## 2016-03-05 NOTE — Telephone Encounter (Signed)
Pt's found a lump in her left breast, thinks it's been back in May  Pt's GYN couldn't do her mammo at her OV with them because it needs to be a diagnostic   Needs [place] to call or order a 3D diagnostic mammo   Please advise

## 2016-03-06 NOTE — Telephone Encounter (Signed)
Left message on voicemail to return call.

## 2016-03-06 NOTE — Telephone Encounter (Signed)
#  1 the patient sees gynecology for her female checkups. This is an issue that her gynecologist should be able to take care of without difficulty I recommend that the patient re-contact gynecology with the concern about a lump in her breasts in make sure that gynecology sees her examined's her in orders a diagnostic mammogram. If for some reason the gynecologist is completely unable to do this then the patient will need to schedule another office visit with [place] for examination then we can set up the tests. Given that she sees her gynecologist as for her female checkups gynecologist is certainly capable of doing an exam of this lump and ordering a diagnostic test. Once again if the patient does not want to go to the gynecologist for this she will need to set up an office visit for this

## 2016-03-11 NOTE — Telephone Encounter (Signed)
Called and left message for patient to return call.  

## 2016-03-12 NOTE — Telephone Encounter (Signed)
Patient states that since the tomo couldn't be done at the gyne office that she contact her pcp to place the order for that at the breast center. She states Dr Nicki Reaper is already aware of the lump and she doesn't want to waste anyone's time with another office visit.

## 2016-03-15 NOTE — Telephone Encounter (Signed)
It is fine to set up a diagnostic mammogram and an ultrasound of the left breast because of the lump. It is also mandatory that the patient doesn't follow-up office visit for an examination of this area either with [place] or with the nurse practitioner since she does not feel that this is her gynecologist area. If she would rather follow-up with gynecology that would be fine. Office visit is necessary as follow-up.

## 2016-03-16 ENCOUNTER — Other Ambulatory Visit: Payer: Self-pay | Admitting: Family Medicine

## 2016-03-16 ENCOUNTER — Ambulatory Visit (HOSPITAL_COMMUNITY): Payer: Medicare Other | Attending: Family Medicine | Admitting: Physical Therapy

## 2016-03-16 ENCOUNTER — Encounter (HOSPITAL_COMMUNITY): Payer: Self-pay | Admitting: Physical Therapy

## 2016-03-16 DIAGNOSIS — Z9882 Breast implant status: Secondary | ICD-10-CM

## 2016-03-16 DIAGNOSIS — R29898 Other symptoms and signs involving the musculoskeletal system: Secondary | ICD-10-CM | POA: Diagnosis not present

## 2016-03-16 DIAGNOSIS — M546 Pain in thoracic spine: Secondary | ICD-10-CM | POA: Diagnosis not present

## 2016-03-16 DIAGNOSIS — M6281 Muscle weakness (generalized): Secondary | ICD-10-CM | POA: Diagnosis not present

## 2016-03-16 DIAGNOSIS — N6320 Unspecified lump in the left breast, unspecified quadrant: Secondary | ICD-10-CM

## 2016-03-16 DIAGNOSIS — M25661 Stiffness of right knee, not elsewhere classified: Secondary | ICD-10-CM | POA: Diagnosis not present

## 2016-03-16 DIAGNOSIS — M25662 Stiffness of left knee, not elsewhere classified: Secondary | ICD-10-CM | POA: Diagnosis not present

## 2016-03-16 NOTE — Therapy (Signed)
[facility] [address] [place], [place], [address] Phone: [phone]   Fax:  [phone]  Physical Therapy Evaluation  Patient Details  Name: Jenna Contreras MRN: [id number] Date of Birth: 1945/08/08 Referring Provider: Sallee Lange, MD  Encounter Date: 03/16/2016      PT End of Session - 03/16/16 1413    Visit Number 1   Number of Visits 13   Date for PT Re-Evaluation 04/06/16   Authorization Type Medicare A/B and tricare   Authorization Time Period 03/16/16 to 05/02/16   PT Start Time 1325  pt arrived late    PT Stop Time [id number]  kept pt late due to availability in therapist schedule   PT Time Calculation (min) 38 min   Activity Tolerance No increased pain;Patient tolerated treatment well   Behavior During Therapy [facility] for tasks assessed/performed      Past Medical History:  Diagnosis Date  . Anxiety   . Arthritis   . Arthritis   . Asthma   . Bipolar 2 disorder ([place])   . Cancer (HCC)    Basal Cell  . Constipation   . Depression   . Fibromyalgia   . GERD (gastroesophageal reflux disease)   . Heart murmur    As small child   . History of gout   . History of skin cancer   . HNP (herniated nucleus pulposus with myelopathy), thoracic   . Hypothyroidism   . Left eye injury    In ED 06/17/2014   . Low BP   . Migraines   . OCD (obsessive compulsive disorder)   . Osteoporosis   . Psoriasis   . Scoliosis     Past Surgical History:  Procedure Laterality Date  . Arthroscopic left knee surgery    . DILATION AND CURETTAGE OF UTERUS  x3  . HIP SURGERY Right 2015  . iliac wings    . KNEE SURGERY Bilateral 2015, 2016  . MANDIBLE SURGERY     X 2  . PARTIAL KNEE ARTHROPLASTY Left 02/12/2014   Procedure: LEFT KNEE UNICOMPARTMENTAL MEDIALLY ARTHROPLASTY ;  Surgeon: Mauri Pole, MD;  Location: [facility];  Service: Orthopedics;  Laterality: Left;  . PARTIAL KNEE ARTHROPLASTY Right 07/02/2014   Procedure: RIGHT UNI KNEE  ARTHOPLASTY MEDIALLY;  Surgeon: Paralee Cancel, MD;  Location: [facility];  Service: Orthopedics;  Laterality: Right;  . rod in spine     scoliosis   . TONSILLECTOMY    . TOTAL HIP ARTHROPLASTY Right 05/09/2013   Procedure: RIGHT TOTAL HIP ARTHROPLASTY ANTERIOR APPROACH;  Surgeon: Mauri Pole, MD;  Location: [facility];  Service: Orthopedics;  Laterality: Right;  . TUBAL LIGATION    . WRIST SURGERY      There were no vitals filed for this visit.       Subjective Assessment - 03/16/16 1325    Subjective Pt reports that she has undergone several surgeries in the past couple of years. Mostly her mid back and her hips are bothering her post spinal surgery in 2016. She has trouble driving and lifting her leg to switch gas pedals.    Pertinent History Rt hip replacement '15, B knee replacement '15/'16, scoliosis rod and iliac wings '16   Patient Stated Goals return to driving and decrease pain    Currently in Pain? No/denies   Pain Location Thoracic   Pain Orientation Left;Right   Pain Descriptors / Indicators Aching   Pain Type Chronic pain   Pain Radiating Towards none  Pain Onset Other (comment)  following back surgery   Pain Frequency Intermittent   Aggravating Factors  always notes it             [facility] PT Assessment - 03/16/16 0001      Assessment   Medical Diagnosis back pain unspecified   Referring Provider Sallee Lange, MD   Next MD Visit 03/21/16   Prior Therapy following all surgeries     Balance Screen   Has the patient fallen in the past 6 months No   Has the patient had a decrease in activity level because of a fear of falling?  No   Is the patient reluctant to leave their home because of a fear of falling?  No     Home [employer] residence     Prior Function   Level of Independence Independent     Cognition   Overall Cognitive Status Within Functional Limits for tasks assessed     Sensation   Light Touch Appears Intact      Posture/Postural Control   Posture Comments slight Rt hip IR noted during stance/sitting     ROM / Strength   AROM / PROM / Strength Strength     Strength   Strength Assessment Site Hip   Right/Left Hip Right;Left   Right Hip Flexion 3-/5   Right Hip Extension 4/5   Right Hip ABduction 4/5   Left Hip Flexion 3-/5   Left Hip Extension 4/5   Left Hip ABduction 4/5     Flexibility   Soft Tissue Assessment /Muscle Length yes   Hamstrings Rt: 30 deg, Lt: 50 deg   lacking      Palpation   Palpation comment Tenderness and muscle knotting noted along B thoracic paraspinals/rhomboids     Transfers   Five time sit to stand comments  10.5 sec, no UE     Ambulation/Gait   Gait Comments slight hip IR noted on Rt                    [facility] Adult PT Treatment/Exercise - 03/16/16 0001      Exercises   Exercises Knee/Hip     Knee/Hip Exercises: Stretches   Passive Hamstring Stretch 1 rep;30 seconds;Both   Passive Hamstring Stretch Limitations HEP demo                PT Education - 03/16/16 1408    Education provided Yes   Education Details eval findings/POC; HEP initiated and reviewed; discussed causes of muscle knotting following surgery and secondary to lack of thoracic mobility; discussed purpose of hip flexors and primary acting muscle depending on the angle of the thigh    Person(s) Educated Patient   Methods Explanation;Demonstration;Handout   Comprehension Verbalized understanding;Returned demonstration          PT Short Term Goals - 03/16/16 1425      PT SHORT TERM GOAL #1   Title Pt will demo consistency and independence with her HEP to improve flexibility and strength    Time 2   Period Weeks   Status New     PT SHORT TERM GOAL #2   Title Pt will demo improved hamstring flexibility to lacking no more than 30 deg on her RLE, to improve sitting posture and knee flexibility.    Time 3   Period Weeks   Status New           PT Long Term  Goals -  2016-03-19 1426      PT LONG TERM GOAL #1   Title Pt will demo improved B seated hip flexor strength to atleast 4-/5 MMT, to allow her safely/quickly switch pedals during driving.    Time 6   Period Weeks   Status New     PT LONG TERM GOAL #2   Title Pt will demo improved remaining BLE strength to atleast 4/5 MMT to improve safety with functional activity.   Time 6   Period Weeks   Status New     PT LONG TERM GOAL #3   Title Pt will demo improved hamstring flexibility to lacking no more than 20 deg, BLE, to allow her to long sit in bed to perform nightly reading.    Time 6   Period Weeks   Status New     PT LONG TERM GOAL #4   Title Pt will report no more than 3/10 pain in her upper back throughout the day, to improve general activity tolerance.    Time 6   Period Weeks   Status New               Plan - 03/19/2016 1415    Clinical Impression Statement Pt is pleasant and motivated 71 yo F referred to OPPT with complaints of back pain and knee stiffness. She has undergone several extensive back/hip/knee surgeries in the past couple of years, with the most recent being a rod placement and iliac wings in 2016. She presents today with significant limitations in hamstring flexibility, hip strength and muscle spasm/knotting throughout her thoracic paraspinals and rhomboids. Due to the lack of mobility in her cervical/thoracic/lumbar spine, her hamstring tightness and hip weakness is impairing her mobility and ability to drive safely. She would benefit from skilled PT to address her limitations listed above, to facilitate improved mobility and independence with daily activity. Eval findings were discussed with the pt and her HEP was initiated. She demonstrated good understanding of her HEP and verbalized agreement with proposed PT POC/frequency.   Rehab Potential Good   PT Frequency 2x / week  decrease to 1x/week as pt symptoms improve and she becomes more independent with HEP    PT Duration 6 weeks   PT Treatment/Interventions ADLs/Self Care Home Management;Moist Heat;Stair training;Functional mobility training;Therapeutic activities;Neuromuscular re-education;Patient/family education;Therapeutic exercise;Balance training;Manual techniques;Dry needling;Passive range of motion   PT Next Visit Plan Ober test/ITB, review HEP, hip extensor/flexor/abd strength, STM as needed along thoracic paraspinals, RTC strengthening   PT Home Exercise Plan standing hamstring stretch    Recommended Other Services none    Consulted and Agree with Plan of Care Patient      Patient will benefit from skilled therapeutic intervention in order to improve the following deficits and impairments:  Decreased activity tolerance, Decreased mobility, Decreased strength, Decreased range of motion, Impaired flexibility, Pain, Increased muscle spasms, Hypomobility  Visit Diagnosis: Pain in thoracic spine  Muscle weakness (generalized)  Other symptoms and signs involving the musculoskeletal system  Stiffness of right knee, not elsewhere classified  Stiffness of left knee, not elsewhere classified      G-Codes - 03/19/2016 1519    Functional Assessment Tool Used Clinical judgement based on assessment of strength, flexibility and activity tolerance.   Functional Limitation Changing and maintaining body position   Changing and Maintaining Body Position Current Status [phone]) At least 40 percent but less than 60 percent impaired, limited or restricted   Changing and Maintaining Body Position Goal Status [id number]) At least 20 percent  but less than 40 percent impaired, limited or restricted       Problem List Patient Active Problem List   Diagnosis Date Noted  . Abdominal pain, epigastric 09/18/2015  . FH: colon cancer 09/18/2015  . Osteoporosis 05/20/2015  . Moderate single current episode of major depressive disorder ([place]) 02/18/2015  . S/P right UKR 07/02/2014  . Status post unilateral knee  replacement 07/02/2014  . Asthma, mild intermittent 02/06/2014  . Lung nodule 02/06/2014  . Hyperlipidemia 01/18/2014  . Expected blood loss anemia 05/10/2013  . Overweight (BMI 25.0-29.9) 05/10/2013  . S/P right THA, AA 05/09/2013  . Pre-operative respiratory examination 05/07/2013  . Post-nasal drainage 05/07/2013  . Endometrial polyp 02/28/2013  . Osteoarthritis of right hip 02/15/2013  . Precordial pain 12/07/2012  . Chronic back pain 08/17/2012  . Hypothyroid 08/17/2012  . Osteopenia 08/17/2012  . Hypersomnia 11/18/2011  . Asthma, mild persistent 08/05/2011  . Memory change 08/05/2011  . TIA (transient ischemic attack) 08/05/2011  . Pulmonary nodule 08/05/2011  . SOB (shortness of breath) 01/05/2011   3:26 PM,03/16/16 Elly Modena PT, DPT [facility] [address] [place], [place], [address] Phone: [phone]   Fax:  [phone]  Name: Enisa Roysdon MRN: [id number] Date of Birth: Jun 25, 1945

## 2016-03-16 NOTE — Telephone Encounter (Signed)
DG mammo and [place] sch for 03/21/2015 arrive at 1220, [facility].  F/U appt made for 03/25/16 w/ Dr Nicki Reaper.  Patient voiced understanding of mammo instructions and agreed with plan.

## 2016-03-19 ENCOUNTER — Other Ambulatory Visit: Payer: Self-pay

## 2016-03-19 DIAGNOSIS — Z9882 Breast implant status: Secondary | ICD-10-CM

## 2016-03-19 DIAGNOSIS — N6320 Unspecified lump in the left breast, unspecified quadrant: Secondary | ICD-10-CM

## 2016-03-20 ENCOUNTER — Ambulatory Visit
Admission: RE | Admit: 2016-03-20 | Discharge: 2016-03-20 | Disposition: A | Discharge status: Home / Self Care | Payer: Medicare Other | Source: Ambulatory Visit | Attending: Family Medicine | Admitting: Family Medicine

## 2016-03-20 ENCOUNTER — Ambulatory Visit (HOSPITAL_COMMUNITY): Payer: Medicare Other | Admitting: Physical Therapy

## 2016-03-20 ENCOUNTER — Other Ambulatory Visit: Payer: Self-pay | Admitting: Family Medicine

## 2016-03-20 DIAGNOSIS — Z9882 Breast implant status: Secondary | ICD-10-CM

## 2016-03-20 DIAGNOSIS — N6320 Unspecified lump in the left breast, unspecified quadrant: Secondary | ICD-10-CM

## 2016-03-20 DIAGNOSIS — R29898 Other symptoms and signs involving the musculoskeletal system: Secondary | ICD-10-CM

## 2016-03-20 DIAGNOSIS — M25662 Stiffness of left knee, not elsewhere classified: Secondary | ICD-10-CM

## 2016-03-20 DIAGNOSIS — M25661 Stiffness of right knee, not elsewhere classified: Secondary | ICD-10-CM

## 2016-03-20 DIAGNOSIS — M546 Pain in thoracic spine: Secondary | ICD-10-CM | POA: Diagnosis not present

## 2016-03-20 DIAGNOSIS — N6489 Other specified disorders of breast: Secondary | ICD-10-CM | POA: Diagnosis not present

## 2016-03-20 DIAGNOSIS — M6281 Muscle weakness (generalized): Secondary | ICD-10-CM | POA: Diagnosis not present

## 2016-03-20 DIAGNOSIS — R928 Other abnormal and inconclusive findings on diagnostic imaging of breast: Secondary | ICD-10-CM | POA: Diagnosis not present

## 2016-03-20 NOTE — Therapy (Signed)
[facility] [address] [place], [place], [address] Phone: [phone]   Fax:  [phone]  Physical Therapy Treatment  Patient Details  Name: Jenna Contreras MRN: [id number] Date of Birth: 08/13/1945 Referring Provider: Sallee Lange, MD  Encounter Date: 03/20/2016      PT End of Session - 03/20/16 1219    Visit Number 2   Number of Visits 13   Date for PT Re-Evaluation 04/06/16   Authorization Type Medicare A/B and tricare   Authorization Time Period 03/16/16 to 05/02/16   PT Start Time 7412  pt arrived late   PT Stop Time 1035   PT Time Calculation (min) 40 min   Activity Tolerance No increased pain;Patient tolerated treatment well   Behavior During Therapy [facility] for tasks assessed/performed      Past Medical History:  Diagnosis Date  . Anxiety   . Arthritis   . Arthritis   . Asthma   . Bipolar 2 disorder ([place])   . Cancer (HCC)    Basal Cell  . Constipation   . Depression   . Fibromyalgia   . GERD (gastroesophageal reflux disease)   . Heart murmur    As small child   . History of gout   . History of skin cancer   . HNP (herniated nucleus pulposus with myelopathy), thoracic   . Hypothyroidism   . Left eye injury    In ED 06/17/2014   . Low BP   . Migraines   . OCD (obsessive compulsive disorder)   . Osteoporosis   . Psoriasis   . Scoliosis     Past Surgical History:  Procedure Laterality Date  . Arthroscopic left knee surgery    . DILATION AND CURETTAGE OF UTERUS  x3  . HIP SURGERY Right 2015  . iliac wings    . KNEE SURGERY Bilateral 2015, 2016  . MANDIBLE SURGERY     X 2  . PARTIAL KNEE ARTHROPLASTY Left 02/12/2014   Procedure: LEFT KNEE UNICOMPARTMENTAL MEDIALLY ARTHROPLASTY ;  Surgeon: Mauri Pole, MD;  Location: [facility];  Service: Orthopedics;  Laterality: Left;  . PARTIAL KNEE ARTHROPLASTY Right 07/02/2014   Procedure: RIGHT UNI KNEE ARTHOPLASTY MEDIALLY;  Surgeon: Paralee Cancel, MD;  Location:  [facility];  Service: Orthopedics;  Laterality: Right;  . rod in spine     scoliosis   . TONSILLECTOMY    . TOTAL HIP ARTHROPLASTY Right 05/09/2013   Procedure: RIGHT TOTAL HIP ARTHROPLASTY ANTERIOR APPROACH;  Surgeon: Mauri Pole, MD;  Location: [facility];  Service: Orthopedics;  Laterality: Right;  . TUBAL LIGATION    . WRIST SURGERY      There were no vitals filed for this visit.      Subjective Assessment - 03/20/16 0956    Subjective Pt reports that her upper back is feeling better after trying the massage against the wall. Her knees are bothering her today after performing her hamstring stretches.    Pertinent History Rt hip replacement '15, B knee replacement '15/'16, scoiosis rod and iliac wings '16   Patient Stated Goals return to driving and decrease pain    Currently in Pain? Yes   Pain Score 7    Pain Location Knee   Pain Orientation Right;Left  "deep in the knee"   Pain Descriptors / Indicators Aching   Pain Type Chronic pain   Pain Onset --  following back surgery  [facility] Adult PT Treatment/Exercise - 03/20/16 0001      Knee/Hip Exercises: Stretches   Sports administrator Left;1 rep;20 seconds   Sports administrator Limitations HEP demo      Manual Therapy   Manual Therapy Soft tissue mobilization;Joint mobilization;Passive ROM   Manual therapy comments separate rest of session   Joint Mobilization Prone: Grade III anterolateral tibial jt mobs 4x30 sec each   Soft tissue mobilization Rolling Rt distal ITB/lateral hamstring; STM mid thoracic paraspinals/rhomboids   Passive ROM B quad stretch 3x20 sec each, followed by MET contract relax, stretch x5 reps each                 PT Education - 03/20/16 1220    Education provided Yes   Education Details Continued HEP adherence and update missed at evaluation; implications for manual treatment and purpose of flexibility testing.    Person(s) Educated Patient   Methods  Explanation;Handout;Verbal cues   Comprehension Verbalized understanding;Returned demonstration          PT Short Term Goals - 03/16/16 1425      PT SHORT TERM GOAL #1   Title Pt will demo consistency and independence with her HEP to improve flexibility and strength    Time 2   Period Weeks   Status New     PT SHORT TERM GOAL #2   Title Pt will demo improved hamstring flexibility to lacking no more than 30 deg on her RLE, to improve sitting posture and knee flexibility.    Time 3   Period Weeks   Status New           PT Long Term Goals - 03/16/16 1426      PT LONG TERM GOAL #1   Title Pt will demo improved B seated hip flexor strength to atleast 4-/5 MMT, to allow her safely/quickly switch pedals during driving.    Time 6   Period Weeks   Status New     PT LONG TERM GOAL #2   Title Pt will demo improved remaining BLE strength to atleast 4/5 MMT to improve safety with functional activity.   Time 6   Period Weeks   Status New     PT LONG TERM GOAL #3   Title Pt will demo improved hamstring flexibility to lacking no more than 20 deg, BLE, to allow her to long sit in bed to perform nightly reading.    Time 6   Period Weeks   Status New     PT LONG TERM GOAL #4   Title Pt will report no more than 3/10 pain in her upper back throughout the day, to improve general activity tolerance.    Time 6   Period Weeks   Status New               Plan - 03/20/16 1220    Clinical Impression Statement Due to limited time during the pt's evaluation, beginning of today's session was spent further assessing hip flexibility and symptom reproduction. Overall, she reports improved upper thoracic pain and muscle spasm following HEP adherence. She demonstrates ITB tightness, quadriceps tightness with flexibility testing likely contributing to her knee ROM and pain with activity. Manual techniques were performed to address muscle spasm and impaired flexibility, noting no increase in  pain following today's session. Will continue with current POC.   Rehab Potential Good   PT Frequency 2x / week  decrease to 1x/week as pt symptoms improve and she becomes more independent with  HEP   PT Duration 6 weeks   PT Treatment/Interventions ADLs/Self Care Home Management;Moist Heat;Stair training;Functional mobility training;Therapeutic activities;Neuromuscular re-education;Patient/family education;Therapeutic exercise;Balance training;Manual techniques;Dry needling;Passive range of motion   PT Next Visit Plan review hamstring/quad stretch in standing, hip extensor/flexor/abd strength, STM as needed along thoracic paraspinals, possible RTC strengthening further along POC   PT Home Exercise Plan standing hamstring stretch, quad stretch     Consulted and Agree with Plan of Care Patient      Patient will benefit from skilled therapeutic intervention in order to improve the following deficits and impairments:  Decreased activity tolerance, Decreased mobility, Decreased strength, Decreased range of motion, Impaired flexibility, Pain, Increased muscle spasms, Hypomobility  Visit Diagnosis: Pain in thoracic spine  Muscle weakness (generalized)  Other symptoms and signs involving the musculoskeletal system  Stiffness of left knee, not elsewhere classified  Stiffness of right knee, not elsewhere classified     Problem List Patient Active Problem List   Diagnosis Date Noted  . Abdominal pain, epigastric 09/18/2015  . FH: colon cancer 09/18/2015  . Osteoporosis 05/20/2015  . Moderate single current episode of major depressive disorder ([place]) 02/18/2015  . S/P right UKR 07/02/2014  . Status post unilateral knee replacement 07/02/2014  . Asthma, mild intermittent 02/06/2014  . Lung nodule 02/06/2014  . Hyperlipidemia 01/18/2014  . Expected blood loss anemia 05/10/2013  . Overweight (BMI 25.0-29.9) 05/10/2013  . S/P right THA, AA 05/09/2013  . Pre-operative respiratory examination  05/07/2013  . Post-nasal drainage 05/07/2013  . Endometrial polyp 02/28/2013  . Osteoarthritis of right hip 02/15/2013  . Precordial pain 12/07/2012  . Chronic back pain 08/17/2012  . Hypothyroid 08/17/2012  . Osteopenia 08/17/2012  . Hypersomnia 11/18/2011  . Asthma, mild persistent 08/05/2011  . Memory change 08/05/2011  . TIA (transient ischemic attack) 08/05/2011  . Pulmonary nodule 08/05/2011  . SOB (shortness of breath) 01/05/2011   12:32 PM,03/20/16 Elly Modena PT, DPT [facility] [address] [place], [place], [address] Phone: [phone]   Fax:  [phone]  Name: Jenna Contreras MRN: [id number] Date of Birth: 28-Feb-1946

## 2016-03-23 ENCOUNTER — Ambulatory Visit (HOSPITAL_COMMUNITY): Payer: Medicare Other | Admitting: Physical Therapy

## 2016-03-23 DIAGNOSIS — R29898 Other symptoms and signs involving the musculoskeletal system: Secondary | ICD-10-CM

## 2016-03-23 DIAGNOSIS — M546 Pain in thoracic spine: Secondary | ICD-10-CM | POA: Diagnosis not present

## 2016-03-23 DIAGNOSIS — M6281 Muscle weakness (generalized): Secondary | ICD-10-CM

## 2016-03-23 DIAGNOSIS — M25661 Stiffness of right knee, not elsewhere classified: Secondary | ICD-10-CM

## 2016-03-23 DIAGNOSIS — M25662 Stiffness of left knee, not elsewhere classified: Secondary | ICD-10-CM | POA: Diagnosis not present

## 2016-03-23 NOTE — Therapy (Signed)
[facility] [address] [place], [place], [address] Phone: [phone]   Fax:  [phone]  Physical Therapy Treatment  Patient Details  Name: Jenna Contreras MRN: [id number] Date of Birth: 1945-08-03 Referring Provider: Sallee Lange, MD  Encounter Date: 03/23/2016      PT End of Session - 03/23/16 1225    Visit Number 3   Number of Visits 13   Date for PT Re-Evaluation 04/06/16   Authorization Type Medicare A/B and tricare   Authorization Time Period 03/16/16 to 05/02/16   PT Start Time 1032   PT Stop Time 1114   PT Time Calculation (min) 42 min   Activity Tolerance No increased pain;Patient tolerated treatment well   Behavior During Therapy [facility] for tasks assessed/performed      Past Medical History:  Diagnosis Date  . Anxiety   . Arthritis   . Arthritis   . Asthma   . Bipolar 2 disorder ([place])   . Cancer (HCC)    Basal Cell  . Constipation   . Depression   . Fibromyalgia   . GERD (gastroesophageal reflux disease)   . Heart murmur    As small child   . History of gout   . History of skin cancer   . HNP (herniated nucleus pulposus with myelopathy), thoracic   . Hypothyroidism   . Left eye injury    In ED 06/17/2014   . Low BP   . Migraines   . OCD (obsessive compulsive disorder)   . Osteoporosis   . Psoriasis   . Scoliosis     Past Surgical History:  Procedure Laterality Date  . Arthroscopic left knee surgery    . DILATION AND CURETTAGE OF UTERUS  x3  . HIP SURGERY Right 2015  . iliac wings    . KNEE SURGERY Bilateral 2015, 2016  . MANDIBLE SURGERY     X 2  . PARTIAL KNEE ARTHROPLASTY Left 02/12/2014   Procedure: LEFT KNEE UNICOMPARTMENTAL MEDIALLY ARTHROPLASTY ;  Surgeon: Mauri Pole, MD;  Location: [facility];  Service: Orthopedics;  Laterality: Left;  . PARTIAL KNEE ARTHROPLASTY Right 07/02/2014   Procedure: RIGHT UNI KNEE ARTHOPLASTY MEDIALLY;  Surgeon: Paralee Cancel, MD;  Location: [facility];  Service:  Orthopedics;  Laterality: Right;  . rod in spine     scoliosis   . TONSILLECTOMY    . TOTAL HIP ARTHROPLASTY Right 05/09/2013   Procedure: RIGHT TOTAL HIP ARTHROPLASTY ANTERIOR APPROACH;  Surgeon: Mauri Pole, MD;  Location: [facility];  Service: Orthopedics;  Laterality: Right;  . TUBAL LIGATION    . WRIST SURGERY      There were no vitals filed for this visit.      Subjective Assessment - 03/23/16 1059    Subjective Pt reports things are going well. She continues to perform her HEP. No pain or issues currently.    Pertinent History Rt hip replacement '15, B knee replacement '15/'16, scoiosis rod and iliac wings '16   Patient Stated Goals return to driving and decrease pain    Currently in Pain? No/denies   Pain Onset --  following back surgery                         [facility] Adult PT Treatment/Exercise - 03/23/16 0001      Knee/Hip Exercises: Stretches   Quad Stretch Both;3 reps;30 seconds   Quad Stretch Limitations standing with towel      Knee/Hip  Exercises: Standing   Wall Squat 2 sets;10 reps     Knee/Hip Exercises: Seated   Ball Squeeze x15 reps, 3 sec hold      Knee/Hip Exercises: Supine   Bridges Limitations x15 reps      Knee/Hip Exercises: Sidelying   Hip ABduction Left;2 sets;10 reps   Clams Rt side 2x10 reps with green TB                PT Education - 03/23/16 1224    Education provided Yes   Education Details discussed noted improvements in strength/mobility with performance of HEP; addition to [employer]) Educated Patient   Methods Explanation;Verbal cues;Handout   Comprehension Verbalized understanding;Returned demonstration          PT Short Term Goals - 03/16/16 1425      PT SHORT TERM GOAL #1   Title Pt will demo consistency and independence with her HEP to improve flexibility and strength    Time 2   Period Weeks   Status New     PT SHORT TERM GOAL #2   Title Pt will demo improved hamstring flexibility to  lacking no more than 30 deg on her RLE, to improve sitting posture and knee flexibility.    Time 3   Period Weeks   Status New           PT Long Term Goals - 03/16/16 1426      PT LONG TERM GOAL #1   Title Pt will demo improved B seated hip flexor strength to atleast 4-/5 MMT, to allow her safely/quickly switch pedals during driving.    Time 6   Period Weeks   Status New     PT LONG TERM GOAL #2   Title Pt will demo improved remaining BLE strength to atleast 4/5 MMT to improve safety with functional activity.   Time 6   Period Weeks   Status New     PT LONG TERM GOAL #3   Title Pt will demo improved hamstring flexibility to lacking no more than 20 deg, BLE, to allow her to long sit in bed to perform nightly reading.    Time 6   Period Weeks   Status New     PT LONG TERM GOAL #4   Title Pt will report no more than 3/10 pain in her upper back throughout the day, to improve general activity tolerance.    Time 6   Period Weeks   Status New               Plan - 03/23/16 1225    Clinical Impression Statement Pt arrived today reporting improvements in functional hip strength, evident by her ability to get in/out of the car without assistance. She continues to perform her HEP without reported difficulty. Session focused on exercises to improve hip flexibility and strength, with pt able to perform all activities without difficulty/pain. Discussed decreasing PT frequency at next session assuming she continues to make these improvements towards her goals. Will shift focus back to mid thoracic spine at next session if this remains unchanged from today.   Rehab Potential Good   PT Frequency 2x / week  decrease to 1x/week as pt symptoms improve and she becomes more independent with HEP   PT Duration 6 weeks   PT Treatment/Interventions ADLs/Self Care Home Management;Moist Heat;Stair training;Functional mobility training;Therapeutic activities;Neuromuscular  re-education;Patient/family education;Therapeutic exercise;Balance training;Manual techniques;Dry needling;Passive range of motion   PT Next Visit Plan STM along thoracic  paraspinals, RTC strengthening; hip extensor/flexor/abd strength   PT Home Exercise Plan standing hamstring stretch, quad stretch , sidelying clamshells with green TB    Consulted and Agree with Plan of Care Patient      Patient will benefit from skilled therapeutic intervention in order to improve the following deficits and impairments:  Decreased activity tolerance, Decreased mobility, Decreased strength, Decreased range of motion, Impaired flexibility, Pain, Increased muscle spasms, Hypomobility  Visit Diagnosis: Pain in thoracic spine  Muscle weakness (generalized)  Other symptoms and signs involving the musculoskeletal system  Stiffness of left knee, not elsewhere classified  Stiffness of right knee, not elsewhere classified     Problem List Patient Active Problem List   Diagnosis Date Noted  . Abdominal pain, epigastric 09/18/2015  . FH: colon cancer 09/18/2015  . Osteoporosis 05/20/2015  . Moderate single current episode of major depressive disorder ([place]) 02/18/2015  . S/P right UKR 07/02/2014  . Status post unilateral knee replacement 07/02/2014  . Asthma, mild intermittent 02/06/2014  . Lung nodule 02/06/2014  . Hyperlipidemia 01/18/2014  . Expected blood loss anemia 05/10/2013  . Overweight (BMI 25.0-29.9) 05/10/2013  . S/P right THA, AA 05/09/2013  . Pre-operative respiratory examination 05/07/2013  . Post-nasal drainage 05/07/2013  . Endometrial polyp 02/28/2013  . Osteoarthritis of right hip 02/15/2013  . Precordial pain 12/07/2012  . Chronic back pain 08/17/2012  . Hypothyroid 08/17/2012  . Osteopenia 08/17/2012  . Hypersomnia 11/18/2011  . Asthma, mild persistent 08/05/2011  . Memory change 08/05/2011  . TIA (transient ischemic attack) 08/05/2011  . Pulmonary nodule 08/05/2011  . SOB  (shortness of breath) 01/05/2011    12:31 PM,03/23/16 Elly Modena PT, DPT [facility] [address] [place], [place], [address] Phone: [phone]   Fax:  [phone]  Name: Jenna Contreras MRN: [id number] Date of Birth: Sep 14, 1945

## 2016-03-25 ENCOUNTER — Ambulatory Visit: Payer: Self-pay | Admitting: Family Medicine

## 2016-03-27 ENCOUNTER — Telehealth (HOSPITAL_COMMUNITY): Payer: Self-pay | Admitting: Family Medicine

## 2016-03-27 ENCOUNTER — Encounter (HOSPITAL_COMMUNITY): Payer: Medicare Other | Admitting: Physical Therapy

## 2016-03-27 NOTE — Telephone Encounter (Signed)
03/27/16 pt left a message on 1/18 that she would be cx today's appt. - no reason was given

## 2016-03-30 ENCOUNTER — Ambulatory Visit (HOSPITAL_COMMUNITY): Payer: Medicare Other | Admitting: Physical Therapy

## 2016-04-01 ENCOUNTER — Ambulatory Visit (HOSPITAL_COMMUNITY): Payer: Medicare Other | Admitting: Physical Therapy

## 2016-04-01 DIAGNOSIS — R29898 Other symptoms and signs involving the musculoskeletal system: Secondary | ICD-10-CM

## 2016-04-01 DIAGNOSIS — M546 Pain in thoracic spine: Secondary | ICD-10-CM

## 2016-04-01 DIAGNOSIS — M25662 Stiffness of left knee, not elsewhere classified: Secondary | ICD-10-CM | POA: Diagnosis not present

## 2016-04-01 DIAGNOSIS — M25661 Stiffness of right knee, not elsewhere classified: Secondary | ICD-10-CM | POA: Diagnosis not present

## 2016-04-01 DIAGNOSIS — M6281 Muscle weakness (generalized): Secondary | ICD-10-CM

## 2016-04-01 NOTE — Therapy (Signed)
[facility] [address] [place], [place], [address] Phone: [phone]   Fax:  [phone]  Physical Therapy Treatment  Patient Details  Name: Jenna Contreras MRN: [id number] Date of Birth: 11-17-1945 Referring Provider: Sallee Lange, MD  Encounter Date: 04/01/2016      PT End of Session - 04/01/16 1256    Visit Number 4   Number of Visits 13   Date for PT Re-Evaluation 04/06/16   Authorization Type Medicare A/B and tricare   Authorization Time Period 03/16/16 to 05/02/16   PT Start Time 1032   PT Stop Time 1115   PT Time Calculation (min) 43 min   Activity Tolerance No increased pain;Patient tolerated treatment well   Behavior During Therapy [facility] for tasks assessed/performed      Past Medical History:  Diagnosis Date  . Anxiety   . Arthritis   . Arthritis   . Asthma   . Bipolar 2 disorder ([place])   . Cancer (HCC)    Basal Cell  . Constipation   . Depression   . Fibromyalgia   . GERD (gastroesophageal reflux disease)   . Heart murmur    As small child   . History of gout   . History of skin cancer   . HNP (herniated nucleus pulposus with myelopathy), thoracic   . Hypothyroidism   . Left eye injury    In ED 06/17/2014   . Low BP   . Migraines   . OCD (obsessive compulsive disorder)   . Osteoporosis   . Psoriasis   . Scoliosis     Past Surgical History:  Procedure Laterality Date  . Arthroscopic left knee surgery    . DILATION AND CURETTAGE OF UTERUS  x3  . HIP SURGERY Right 2015  . iliac wings    . KNEE SURGERY Bilateral 2015, 2016  . MANDIBLE SURGERY     X 2  . PARTIAL KNEE ARTHROPLASTY Left 02/12/2014   Procedure: LEFT KNEE UNICOMPARTMENTAL MEDIALLY ARTHROPLASTY ;  Surgeon: Mauri Pole, MD;  Location: [facility];  Service: Orthopedics;  Laterality: Left;  . PARTIAL KNEE ARTHROPLASTY Right 07/02/2014   Procedure: RIGHT UNI KNEE ARTHOPLASTY MEDIALLY;  Surgeon: Paralee Cancel, MD;  Location: [facility];  Service:  Orthopedics;  Laterality: Right;  . rod in spine     scoliosis   . TONSILLECTOMY    . TOTAL HIP ARTHROPLASTY Right 05/09/2013   Procedure: RIGHT TOTAL HIP ARTHROPLASTY ANTERIOR APPROACH;  Surgeon: Mauri Pole, MD;  [facility];  Service: Orthopedics;  Laterality: Right;  . TUBAL LIGATION    . WRIST SURGERY      There were no vitals filed for this visit.      Subjective Assessment - 04/01/16 1036    Subjective (P)  Pt reports things are going well. She tried her band at home but it seems to be too stretchy. She reports that her knees are improving but they have a long ways to go. She has been trying to perform her HEP and it is going well.    Pertinent History (P)  Rt hip replacement '15, B knee replacement '15/'16, scoiosis rod and iliac wings '16   Patient Stated Goals (P)  return to driving and decrease pain    Currently in Pain? (P)  Yes   Pain Score (P)  7    Pain Location (P)  Thoracic   Pain Orientation (P)  Upper;Mid   Pain Descriptors / Indicators (P)  Aching  Pain Type (P)  Chronic pain   Pain Radiating Towards (P)  none    Pain Onset (P)  More than a month ago  following back surgery   Pain Frequency (P)  Intermittent                         [facility] Adult PT Treatment/Exercise - 04/01/16 0001      Exercises   Exercises Shoulder     Knee/Hip Exercises: Stretches   Hip Flexor Stretch Left;3 reps;30 seconds   Hip Flexor Stretch Limitations supine    Other Knee/Hip Stretches [place] 6, RLE only x10 reps      Knee/Hip Exercises: Supine   Other Supine Knee/Hip Exercises Lt hip flexion x10 reps      Manual Therapy   Manual Therapy Soft tissue mobilization;Passive ROM   Manual therapy comments separate rest of session   Soft tissue mobilization thoracic paraspinals and B levator scap   Passive ROM Rt scapular upward rotation 5x15 sec hold                PT Education - 04/01/16 1250    Education provided Yes   Education Details  importance of strengthening RTC to assist with overhead activity; updated HEP   Person(s) Educated Patient   Methods Explanation;Handout;Verbal cues   Comprehension Verbalized understanding;Returned demonstration          PT Short Term Goals - 03/16/16 1425      PT SHORT TERM GOAL #1   Title Pt will demo consistency and independence with her HEP to improve flexibility and strength    Time 2   Period Weeks   Status New     PT SHORT TERM GOAL #2   Title Pt will demo improved hamstring flexibility to lacking no more than 30 deg on her RLE, to improve sitting posture and knee flexibility.    Time 3   Period Weeks   Status New           PT Long Term Goals - 03/16/16 1426      PT LONG TERM GOAL #1   Title Pt will demo improved B seated hip flexor strength to atleast 4-/5 MMT, to allow her safely/quickly switch pedals during driving.    Time 6   Period Weeks   Status New     PT LONG TERM GOAL #2   Title Pt will demo improved remaining BLE strength to atleast 4/5 MMT to improve safety with functional activity.   Time 6   Period Weeks   Status New     PT LONG TERM GOAL #3   Title Pt will demo improved hamstring flexibility to lacking no more than 20 deg, BLE, to allow her to long sit in bed to perform nightly reading.    Time 6   Period Weeks   Status New     PT LONG TERM GOAL #4   Title Pt will report no more than 3/10 pain in her upper back throughout the day, to improve general activity tolerance.    Time 6   Period Weeks   Status New               Plan - 04/01/16 1256    Clinical Impression Statement Pt continues to make improvements with hip/knee mobility and strength per her report of improving pain/symptoms since her initial evaluation. Today's session focused on soft tissue massage and rotator cuff strengthening to improve muscle spasm and  pain along her thoracic paraspinals and other shoulder stabilizers. Pt reporting 2 point decrease in pain following  today's session and encouraged to continue with her HEP.   Rehab Potential Good   PT Frequency 2x / week  decrease to 1x/week as pt symptoms improve and she becomes more independent with HEP   PT Duration 6 weeks   PT Treatment/Interventions ADLs/Self Care Home Management;Moist Heat;Stair training;Functional mobility training;Therapeutic activities;Neuromuscular re-education;Patient/family education;Therapeutic exercise;Balance training;Manual techniques;Dry needling;Passive range of motion   PT Next Visit Plan discussed shoulder movements and muscle activity in combo with thoracic mobility; STM along thoracic paraspinals, RTC strengthening on massage table; hip extensor/flexor/abd strength   PT Home Exercise Plan standing hamstring stretch, quad stretch, sidelying clamshells with green TB    Consulted and Agree with Plan of Care Patient      Patient will benefit from skilled therapeutic intervention in order to improve the following deficits and impairments:  Decreased activity tolerance, Decreased mobility, Decreased strength, Decreased range of motion, Impaired flexibility, Pain, Increased muscle spasms, Hypomobility  Visit Diagnosis: Pain in thoracic spine  Muscle weakness (generalized)  Other symptoms and signs involving the musculoskeletal system  Stiffness of left knee, not elsewhere classified  Stiffness of right knee, not elsewhere classified     Problem List Patient Active Problem List   Diagnosis Date Noted  . Abdominal pain, epigastric 09/18/2015  . FH: colon cancer 09/18/2015  . Osteoporosis 05/20/2015  . Moderate single current episode of major depressive disorder ([place]) 02/18/2015  . S/P right UKR 07/02/2014  . Status post unilateral knee replacement 07/02/2014  . Asthma, mild intermittent 02/06/2014  . Lung nodule 02/06/2014  . Hyperlipidemia 01/18/2014  . Expected blood loss anemia 05/10/2013  . Overweight (BMI 25.0-29.9) 05/10/2013  . S/P right THA, AA  05/09/2013  . Pre-operative respiratory examination 05/07/2013  . Post-nasal drainage 05/07/2013  . Endometrial polyp 02/28/2013  . Osteoarthritis of right hip 02/15/2013  . Precordial pain 12/07/2012  . Chronic back pain 08/17/2012  . Hypothyroid 08/17/2012  . Osteopenia 08/17/2012  . Hypersomnia 11/18/2011  . Asthma, mild persistent 08/05/2011  . Memory change 08/05/2011  . TIA (transient ischemic attack) 08/05/2011  . Pulmonary nodule 08/05/2011  . SOB (shortness of breath) 01/05/2011   1:05 PM,04/01/16 Elly Modena PT, DPT [facility] [address] [place], [place], [address] Phone: [phone]   Fax:  [phone]  Name: Jenna Contreras MRN: [id number] Date of Birth: [birth year]

## 2016-04-03 ENCOUNTER — Ambulatory Visit (HOSPITAL_COMMUNITY): Payer: Medicare Other | Admitting: Physical Therapy

## 2016-04-03 DIAGNOSIS — M25661 Stiffness of right knee, not elsewhere classified: Secondary | ICD-10-CM

## 2016-04-03 DIAGNOSIS — M546 Pain in thoracic spine: Secondary | ICD-10-CM | POA: Diagnosis not present

## 2016-04-03 DIAGNOSIS — M6281 Muscle weakness (generalized): Secondary | ICD-10-CM

## 2016-04-03 DIAGNOSIS — M25662 Stiffness of left knee, not elsewhere classified: Secondary | ICD-10-CM | POA: Diagnosis not present

## 2016-04-03 DIAGNOSIS — R29898 Other symptoms and signs involving the musculoskeletal system: Secondary | ICD-10-CM | POA: Diagnosis not present

## 2016-04-03 NOTE — Therapy (Signed)
[facility] [address] [place], [place], [address] Phone: [phone]   Fax:  [phone]  Physical Therapy Treatment  Patient Details  Name: Jenna Contreras MRN: [id number] Date of Birth: 01-Jul-1945 Referring Provider: Sallee Lange, MD  Encounter Date: 04/03/2016      PT End of Session - 04/03/16 1051    Visit Number 5   Number of Visits 13   Date for PT Re-Evaluation 04/06/16   Authorization Type Medicare A/B and tricare   Authorization Time Period 03/16/16 to 05/02/16   PT Start Time 1040   PT Stop Time 1119   PT Time Calculation (min) 39 min   Activity Tolerance No increased pain;Patient tolerated treatment well   Behavior During Therapy [facility] for tasks assessed/performed      Past Medical History:  Diagnosis Date  . Anxiety   . Arthritis   . Arthritis   . Asthma   . Bipolar 2 disorder ([place])   . Cancer (HCC)    Basal Cell  . Constipation   . Depression   . Fibromyalgia   . GERD (gastroesophageal reflux disease)   . Heart murmur    As small child   . History of gout   . History of skin cancer   . HNP (herniated nucleus pulposus with myelopathy), thoracic   . Hypothyroidism   . Left eye injury    In ED 06/17/2014   . Low BP   . Migraines   . OCD (obsessive compulsive disorder)   . Osteoporosis   . Psoriasis   . Scoliosis     Past Surgical History:  Procedure Laterality Date  . Arthroscopic left knee surgery    . DILATION AND CURETTAGE OF UTERUS  x3  . HIP SURGERY Right 2015  . iliac wings    . KNEE SURGERY Bilateral 2015, 2016  . MANDIBLE SURGERY     X 2  . PARTIAL KNEE ARTHROPLASTY Left 02/12/2014   Procedure: LEFT KNEE UNICOMPARTMENTAL MEDIALLY ARTHROPLASTY ;  Surgeon: Mauri Pole, MD;  Location: [facility];  Service: Orthopedics;  Laterality: Left;  . PARTIAL KNEE ARTHROPLASTY Right 07/02/2014   Procedure: RIGHT UNI KNEE ARTHOPLASTY MEDIALLY;  Surgeon: Paralee Cancel, MD;  Location: [facility];  Service:  Orthopedics;  Laterality: Right;  . rod in spine     scoliosis   . TONSILLECTOMY    . TOTAL HIP ARTHROPLASTY Right 05/09/2013   Procedure: RIGHT TOTAL HIP ARTHROPLASTY ANTERIOR APPROACH;  Surgeon: Mauri Pole, MD;  Location: [facility];  Service: Orthopedics;  Laterality: Right;  . TUBAL LIGATION    . WRIST SURGERY      There were no vitals filed for this visit.      Subjective Assessment - 04/03/16 1041    Subjective Pt reports that she felt great after her last session with full resolution of pain, but felt increasingly sore the following 2 days. She is now back to "normal".   Pertinent History Rt hip replacement '15, B knee replacement '15/'16, scoiosis rod and iliac wings '16   Patient Stated Goals return to driving and decrease pain    Pain Score 3    Pain Location Thoracic   Pain Orientation Mid   Pain Descriptors / Indicators Aching   Pain Type Chronic pain   Pain Onset More than a month ago  following back surgery   Pain Frequency Constant  [place] Adult PT Treatment/Exercise - 04/03/16 0001      Knee/Hip Exercises: Supine   Other Supine Knee/Hip Exercises ab set x15 reps, 5 sec hold      Shoulder Exercises: Seated   Retraction Both;15 reps;Other (comment)  3 sec      Shoulder Exercises: Stretch   Cross Chest Stretch 3 reps;30 seconds     Manual Therapy   Manual Therapy Soft tissue mobilization;Passive ROM   Manual therapy comments separate rest of session   Soft tissue mobilization STM and TPR along thoracic paraspinals and B levator scap   Passive ROM Lt levator stretch x5 reps                 PT Education - 04/03/16 1204    Education provided Yes   Education Details impact the lack of thoracic mobility will have on shoulder mobility and the importance of improving and maintaining as much shoulder strength as possible. Importance of finding exercises/stretches to manage muscle spasm/stiffness in the future    Person(s) Educated Patient   Methods Explanation;Verbal cues   Comprehension Verbalized understanding          PT Short Term Goals - 03/16/16 1425      PT SHORT TERM GOAL #1   Title Pt will demo consistency and independence with her HEP to improve flexibility and strength    Time 2   Period Weeks   Status New     PT SHORT TERM GOAL #2   Title Pt will demo improved hamstring flexibility to lacking no more than 30 deg on her RLE, to improve sitting posture and knee flexibility.    Time 3   Period Weeks   Status New           PT Long Term Goals - 03/16/16 1426      PT LONG TERM GOAL #1   Title Pt will demo improved B seated hip flexor strength to atleast 4-/5 MMT, to allow her safely/quickly switch pedals during driving.    Time 6   Period Weeks   Status New     PT LONG TERM GOAL #2   Title Pt will demo improved remaining BLE strength to atleast 4/5 MMT to improve safety with functional activity.   Time 6   Period Weeks   Status New     PT LONG TERM GOAL #3   Title Pt will demo improved hamstring flexibility to lacking no more than 20 deg, BLE, to allow her to long sit in bed to perform nightly reading.    Time 6   Period Weeks   Status New     PT LONG TERM GOAL #4   Title Pt will report no more than 3/10 pain in her upper back throughout the day, to improve general activity tolerance.    Time 6   Period Weeks   Status New               Plan - 04/03/16 1214    Clinical Impression Statement Pt reporting decreased pain report today since her last session, rating her pain 3/10 upon arrival. Session focused on education and addressing many of her concerns about function, etc because of her history of spinal procedures. Also performed some exercises to address the strength of thoracic musculature and ended with STM along the mid thoracic region to decrease palpable trigger points and improve pain. Decreased pain report to 0/10 by the end of the session. Will  continue with current POC.  Rehab Potential Good   PT Frequency 2x / week  decrease to 1x/week as pt symptoms improve and she becomes more independent with HEP   PT Duration 6 weeks   PT Treatment/Interventions ADLs/Self Care Home Management;Moist Heat;Stair training;Functional mobility training;Therapeutic activities;Neuromuscular re-education;Patient/family education;Therapeutic exercise;Balance training;Manual techniques;Dry needling;Passive range of motion   PT Next Visit Plan cross arm stretch, reassessment   PT Home Exercise Plan standing hamstring stretch, quad stretch, sidelying clamshells with green TB    Consulted and Agree with Plan of Care Patient      Patient will benefit from skilled therapeutic intervention in order to improve the following deficits and impairments:  Decreased activity tolerance, Decreased mobility, Decreased strength, Decreased range of motion, Impaired flexibility, Pain, Increased muscle spasms, Hypomobility  Visit Diagnosis: Pain in thoracic spine  Muscle weakness (generalized)  Other symptoms and signs involving the musculoskeletal system  Stiffness of left knee, not elsewhere classified  Stiffness of right knee, not elsewhere classified     Problem List Patient Active Problem List   Diagnosis Date Noted  . Abdominal pain, epigastric 09/18/2015  . FH: colon cancer 09/18/2015  . Osteoporosis 05/20/2015  . Moderate single current episode of major depressive disorder ([place]) 02/18/2015  . S/P right UKR 07/02/2014  . Status post unilateral knee replacement 07/02/2014  . Asthma, mild intermittent 02/06/2014  . Lung nodule 02/06/2014  . Hyperlipidemia 01/18/2014  . Expected blood loss anemia 05/10/2013  . Overweight (BMI 25.0-29.9) 05/10/2013  . S/P right THA, AA 05/09/2013  . Pre-operative respiratory examination 05/07/2013  . Post-nasal drainage 05/07/2013  . Endometrial polyp 02/28/2013  . Osteoarthritis of right hip 02/15/2013  .  Precordial pain 12/07/2012  . Chronic back pain 08/17/2012  . Hypothyroid 08/17/2012  . Osteopenia 08/17/2012  . Hypersomnia 11/18/2011  . Asthma, mild persistent 08/05/2011  . Memory change 08/05/2011  . TIA (transient ischemic attack) 08/05/2011  . Pulmonary nodule 08/05/2011  . SOB (shortness of breath) 01/05/2011   12:18 PM,04/03/16 Elly Modena PT, DPT [facility] [address] [place], [place], [address] Phone: [phone]   Fax:  [phone]  Name: Cinthya Datta MRN: [id number] Date of Birth: 1945-03-13

## 2016-04-06 ENCOUNTER — Ambulatory Visit (HOSPITAL_COMMUNITY): Payer: Medicare Other | Admitting: Physical Therapy

## 2016-04-06 DIAGNOSIS — M6281 Muscle weakness (generalized): Secondary | ICD-10-CM

## 2016-04-06 DIAGNOSIS — R29898 Other symptoms and signs involving the musculoskeletal system: Secondary | ICD-10-CM | POA: Diagnosis not present

## 2016-04-06 DIAGNOSIS — M546 Pain in thoracic spine: Secondary | ICD-10-CM | POA: Diagnosis not present

## 2016-04-06 DIAGNOSIS — M25662 Stiffness of left knee, not elsewhere classified: Secondary | ICD-10-CM

## 2016-04-06 DIAGNOSIS — M25661 Stiffness of right knee, not elsewhere classified: Secondary | ICD-10-CM

## 2016-04-06 NOTE — Therapy (Signed)
[place], [place], [address] Phone: [phone]   Fax:  [phone]  Physical Therapy Treatment/Reassessment  Patient Details  Name: Jenna Contreras MRN: [id number] Date of Birth: 12-Jun-1945 Referring Provider: Sallee Lange, MD  Encounter Date: 04/06/2016      PT End of Session - 04/06/16 1210    Visit Number 6   Number of Visits 13   Date for PT Re-Evaluation 05/01/16   Authorization Type Medicare A/B and tricare   Authorization Time Period 03/16/16 to 05/02/16   PT Start Time 1032   PT Stop Time 1114   PT Time Calculation (min) 42 min   Activity Tolerance No increased pain;Patient tolerated treatment well   Behavior During Therapy [facility] for tasks assessed/performed      Past Medical History:  Diagnosis Date  . Anxiety   . Arthritis   . Arthritis   . Asthma   . Bipolar 2 disorder ([place])   . Cancer (HCC)    Basal Cell  . Constipation   . Depression   . Fibromyalgia   . GERD (gastroesophageal reflux disease)   . Heart murmur    As small child   . History of gout   . History of skin cancer   . HNP (herniated nucleus pulposus with myelopathy), thoracic   . Hypothyroidism   . Left eye injury    In ED 06/17/2014   . Low BP   . Migraines   . OCD (obsessive compulsive disorder)   . Osteoporosis   . Psoriasis   . Scoliosis     Past Surgical History:  Procedure Laterality Date  . Arthroscopic left knee surgery    . DILATION AND CURETTAGE OF UTERUS  x3  . HIP SURGERY Right 2015  . iliac wings    . KNEE SURGERY Bilateral 2015, 2016  . MANDIBLE SURGERY     X 2  . PARTIAL KNEE ARTHROPLASTY Left 02/12/2014   Procedure: LEFT KNEE UNICOMPARTMENTAL MEDIALLY ARTHROPLASTY ;  Surgeon: Mauri Pole, MD;  [facility];  Service: Orthopedics;  Laterality: Left;  . PARTIAL KNEE ARTHROPLASTY Right 07/02/2014   Procedure: RIGHT UNI KNEE ARTHOPLASTY MEDIALLY;  Surgeon: Paralee Cancel, MD;  [facility];  Service: Orthopedics;  Laterality: Right;  . rod in spine     scoliosis   . TONSILLECTOMY    . TOTAL HIP ARTHROPLASTY Right 05/09/2013   Procedure: RIGHT TOTAL HIP ARTHROPLASTY ANTERIOR APPROACH;  Surgeon: Mauri Pole, MD;  [facility]: [facility];  Service: Orthopedics;  Laterality: Right;  . TUBAL LIGATION    . WRIST SURGERY      There were no vitals filed for this visit.      Subjective Assessment - 04/06/16 1035    Subjective Pt reports that she has seen a huge difference in her pain since beginning PT. She has not taken her muscle relaxer for the past several days. She continues to perform her HEP, but on Sunday she was very busy and wasn't able to perform her HEP completely. She was able to pain furniture on Friday without her muscle relaxer.    Pertinent History Rt hip replacement '15, B knee replacement '15/'16, scoiosis rod and iliac wings '16   Patient Stated Goals return to driving and decrease pain    Currently in Pain? No/denies   Pain Onset More than a month ago  following back surgery            [facility] PT  Assessment - 04/06/16 0001      Assessment   Medical Diagnosis back pain unspecified   Referring Provider Sallee Lange, MD   Next MD Visit 03/21/16   Prior Therapy following all surgeries     [place] residence     Prior Function   Level of Independence Independent     Cognition   Overall Cognitive Status Within Functional Limits for tasks assessed     Sensation   Light Touch Appears Intact     Posture/Postural Control   Posture Comments --     Strength   Right Hip Flexion 3/5   Right Hip Extension 4+/5   Right Hip ABduction 4+/5   Left Hip Flexion 3+/5   Left Hip Extension 4+/5   Left Hip ABduction 4+/5     Flexibility   Soft Tissue Assessment /Muscle Length yes   Hamstrings Rt: 35 deg, Lt: 30 deg   lacking      Palpation   Palpation comment --     Transfers   Five time sit to stand comments  9.2  sec, no UE  low mat table      Ambulation/Gait   Gait Comments --                     [facility] Adult PT Treatment/Exercise - 04/06/16 0001      Manual Therapy   Manual Therapy Passive ROM   Manual therapy comments separate rest of session    Soft tissue mobilization Rt TFL   Passive ROM B hamstring stretch 5x20 sec each                 PT Education - 04/06/16 1208    Education provided Yes   Education Details improvements in strength, activity tolerance, etc. Discussed upcoming update with HEP; importance of pacing her activity to improve tolerance and independence with activity.     Person(s) Educated Patient   Methods Explanation;Handout;Verbal cues   Comprehension Verbalized understanding;Returned demonstration          PT Short Term Goals - 04/06/16 1216      PT SHORT TERM GOAL #1   Title Pt will demo consistency and independence with her HEP to improve flexibility and strength    Time 2   Period Weeks   Status Achieved     PT SHORT TERM GOAL #2   Title Pt will demo improved hamstring flexibility to lacking no more than 30 deg on her RLE, to improve sitting posture and knee flexibility.    Baseline 35 deg lacking    Time 3   Period Weeks   Status Partially Met           PT Long Term Goals - 04/06/16 1215      PT LONG TERM GOAL #1   Title Pt will demo improved B seated hip flexor strength to atleast 4-/5 MMT, to allow her safely/quickly switch pedals during driving.    Baseline hip flexor strength less than 4-/5   Time 6   Period Weeks   Status Partially Met     PT LONG TERM GOAL #2   Title Pt will demo improved remaining BLE strength to atleast 4/5 MMT to improve safety with functional activity.   Time 6   Period Weeks   Status Achieved     PT LONG TERM GOAL #3   Title Pt will demo improved hamstring flexibility to lacking no more than 20  deg, BLE, to allow her to long sit in bed to perform nightly reading.    Baseline lacking  more than 20 deg   Time 6   Period Weeks   Status Partially Met     PT LONG TERM GOAL #4   Title Pt will report no more than 3/10 pain in her upper back throughout the day, to improve general activity tolerance.    Time 6   Period Weeks   Status Achieved               Plan - 04-14-16 1210    Clinical Impression Statement Pt was reassessed this visit having reported significant improvements in pain and activity tolerance since beginning PT several weeks ago. She reports decreasing need for pain medication and muscle relaxers as well 0/10 pain report upon arrival with the last 2 visits. She is performing her HEP regularly and is now walking daily to improve her endurance. She demonstrates improvements in BLE strength and flexibility, however this does continue to be an area of limitation for her, and she would benefit from continued skilled PT to address them and facilitate progress towards her goals. Encouraged continued HEP adherence and PT POC moving forward. Pt verbalized understanding.    Rehab Potential Good   PT Frequency 2x / week  decrease to 1x/week as pt symptoms improve and she becomes more independent with HEP   PT Duration 6 weeks   PT Treatment/Interventions ADLs/Self Care Home Management;Moist Heat;Stair training;Functional mobility training;Therapeutic activities;Neuromuscular re-education;Patient/family education;Therapeutic exercise;Balance training;Manual techniques;Dry needling;Passive range of motion   PT Next Visit Plan [place] 6 provide for HEP; hamstring stretch/hip flexor stretch, ITB stretch    PT Home Exercise Plan standing hamstring stretch, quad stretch, sidelying clamshells with green TB    Consulted and Agree with Plan of Care Patient      Patient will benefit from skilled therapeutic intervention in order to improve the following deficits and impairments:  Decreased activity tolerance, Decreased mobility, Decreased strength, Decreased range of motion,  Impaired flexibility, Pain, Increased muscle spasms, Hypomobility  Visit Diagnosis: Pain in thoracic spine  Muscle weakness (generalized)  Other symptoms and signs involving the musculoskeletal system  Stiffness of left knee, not elsewhere classified  Stiffness of right knee, not elsewhere classified       G-Codes - 2016/04/14 1216    Functional Assessment Tool Used Clinical judgement based on assessment of strength, flexibility and activity tolerance.   Functional Limitation Changing and maintaining body position   Changing and Maintaining Body Position Current Status [id number]) At least 20 percent but less than 40 percent impaired, limited or restricted   Changing and Maintaining Body Position Goal Status ([id number]) At least 20 percent but less than 40 percent impaired, limited or restricted      Problem List Patient Active Problem List   Diagnosis Date Noted  . Abdominal pain, epigastric 09/18/2015  . FH: colon cancer 09/18/2015  . Osteoporosis 05/20/2015  . Moderate single current episode of major depressive disorder ([place]) 02/18/2015  . S/P right UKR 07/02/2014  . Status post unilateral knee replacement 07/02/2014  . Asthma, mild intermittent 02/06/2014  . Lung nodule 02/06/2014  . Hyperlipidemia 01/18/2014  . Expected blood loss anemia 05/10/2013  . Overweight (BMI 25.0-29.9) 05/10/2013  . S/P right THA, AA 05/09/2013  . Pre-operative respiratory examination 05/07/2013  . Post-nasal drainage 05/07/2013  . Endometrial polyp 02/28/2013  . Osteoarthritis of right hip 02/15/2013  . Precordial pain 12/07/2012  . Chronic back pain 08/17/2012  .  Hypothyroid 08/17/2012  . Osteopenia 08/17/2012  . Hypersomnia 11/18/2011  . Asthma, mild persistent 08/05/2011  . Memory change 08/05/2011  . TIA (transient ischemic attack) 08/05/2011  . Pulmonary nodule 08/05/2011  . SOB (shortness of breath) 01/05/2011    12:20 PM,04/06/16 Elly Modena PT, DPT [facility] [address] [place], [place], [address] Phone: [phone]   Fax:  [phone]  Name: Jenna Contreras MRN: [id number] Date of Birth: Oct 17, 1945

## 2016-04-10 ENCOUNTER — Ambulatory Visit (HOSPITAL_COMMUNITY): Payer: Medicare Other | Attending: Family Medicine | Admitting: Physical Therapy

## 2016-04-10 DIAGNOSIS — M25662 Stiffness of left knee, not elsewhere classified: Secondary | ICD-10-CM | POA: Insufficient documentation

## 2016-04-10 DIAGNOSIS — M25661 Stiffness of right knee, not elsewhere classified: Secondary | ICD-10-CM | POA: Insufficient documentation

## 2016-04-10 DIAGNOSIS — R29898 Other symptoms and signs involving the musculoskeletal system: Secondary | ICD-10-CM | POA: Insufficient documentation

## 2016-04-10 DIAGNOSIS — M6281 Muscle weakness (generalized): Secondary | ICD-10-CM | POA: Diagnosis not present

## 2016-04-10 DIAGNOSIS — M546 Pain in thoracic spine: Secondary | ICD-10-CM | POA: Insufficient documentation

## 2016-04-10 NOTE — Therapy (Signed)
[facility] [place], [place], [address] Phone: [phone]   Fax:  [phone]  Physical Therapy Treatment  Patient Details  Name: Jenna Contreras MRN: [id number] Date of Birth: [birth year] Referring Provider: Sallee Lange, MD  Encounter Date: 04/10/2016      PT End of Session - 04/10/16 1359    Visit Number 7   Number of Visits 13   Date for PT Re-Evaluation 05/01/16   Authorization Type Medicare A/B and tricare   Authorization Time Period 03/16/16 to 05/02/16   PT Start Time 1032   PT Stop Time 1115   PT Time Calculation (min) 43 min   Activity Tolerance No increased pain;Patient tolerated treatment well   Behavior During Therapy [facility] for tasks assessed/performed      Past Medical History:  Diagnosis Date  . Anxiety   . Arthritis   . Arthritis   . Asthma   . Bipolar 2 disorder ([place])   . Cancer (HCC)    Basal Cell  . Constipation   . Depression   . Fibromyalgia   . GERD (gastroesophageal reflux disease)   . Heart murmur    As small child   . History of gout   . History of skin cancer   . HNP (herniated nucleus pulposus with myelopathy), thoracic   . Hypothyroidism   . Left eye injury    In ED 06/17/2014   . Low BP   . Migraines   . OCD (obsessive compulsive disorder)   . Osteoporosis   . Psoriasis   . Scoliosis     Past Surgical History:  Procedure Laterality Date  . Arthroscopic left knee surgery    . DILATION AND CURETTAGE OF UTERUS  x3  . HIP SURGERY Right 2015  . iliac wings    . KNEE SURGERY Bilateral 2015, 2016  . MANDIBLE SURGERY     X 2  . PARTIAL KNEE ARTHROPLASTY Left 02/12/2014   Procedure: LEFT KNEE UNICOMPARTMENTAL MEDIALLY ARTHROPLASTY ;  Surgeon: Mauri Pole, MD;  Location: [facility];  Service: Orthopedics;  Laterality: Left;  . PARTIAL KNEE ARTHROPLASTY Right 07/02/2014   Procedure: RIGHT UNI KNEE ARTHOPLASTY MEDIALLY;  Surgeon: Paralee Cancel, MD;  Location: [facility];  Service:  Orthopedics;  Laterality: Right;  . rod in spine     scoliosis   . TONSILLECTOMY    . TOTAL HIP ARTHROPLASTY Right 05/09/2013   Procedure: RIGHT TOTAL HIP ARTHROPLASTY ANTERIOR APPROACH;  Surgeon: Mauri Pole, MD;  Location: [facility];  Service: Orthopedics;  Laterality: Right;  . TUBAL LIGATION    . WRIST SURGERY      There were no vitals filed for this visit.      Subjective Assessment - 04/10/16 1035    Subjective Pt reports that things are going great. She had no pain this morning but has some soreness in her upper back after trying to get her shoes on in the car. This is about 2/10 currently.    Pertinent History Rt hip replacement '15, B knee replacement '15/'16, scoiosis rod and iliac wings '16   Patient Stated Goals return to driving and decrease pain    Currently in Pain? Other (Comment)  see above for more detail   Pain Onset More than a month ago  following back surgery                         [facility] Adult PT Treatment/Exercise - 04/10/16  0001      Knee/Hip Exercises: Stretches   Passive Hamstring Stretch 2 reps;30 seconds   Passive Hamstring Stretch Limitations 12" box    Hip Flexor Stretch Both;3 reps;20 seconds   Hip Flexor Stretch Limitations in chair      Shoulder Exercises: Prone   Other Prone Exercises Blackburn 6, BUE x10 reps each      Manual Therapy   Manual Therapy Soft tissue mobilization;Myofascial release   Manual therapy comments separate rest of session    Soft tissue mobilization mid thoracic paraspinals   Myofascial Release Lt levator scap                 PT Education - 04/10/16 1359    Education provided Yes   Education Details updated HEP; technique with therex    Person(s) Educated Patient   Methods Explanation;Demonstration;Verbal cues;Handout   Comprehension Verbalized understanding;Returned demonstration          PT Short Term Goals - 04/06/16 1216      PT SHORT TERM GOAL #1   Title Pt will demo  consistency and independence with her HEP to improve flexibility and strength    Time 2   Period Weeks   Status Achieved     PT SHORT TERM GOAL #2   Title Pt will demo improved hamstring flexibility to lacking no more than 30 deg on her RLE, to improve sitting posture and knee flexibility.    Baseline 35 deg lacking    Time 3   Period Weeks   Status Partially Met           PT Long Term Goals - 04/06/16 1215      PT LONG TERM GOAL #1   Title Pt will demo improved B seated hip flexor strength to atleast 4-/5 MMT, to allow her safely/quickly switch pedals during driving.    Baseline hip flexor strength less than 4-/5   Time 6   Period Weeks   Status Partially Met     PT LONG TERM GOAL #2   Title Pt will demo improved remaining BLE strength to atleast 4/5 MMT to improve safety with functional activity.   Time 6   Period Weeks   Status Achieved     PT LONG TERM GOAL #3   Title Pt will demo improved hamstring flexibility to lacking no more than 20 deg, BLE, to allow her to long sit in bed to perform nightly reading.    Baseline lacking more than 20 deg   Time 6   Period Weeks   Status Partially Met     PT LONG TERM GOAL #4   Title Pt will report no more than 3/10 pain in her upper back throughout the day, to improve general activity tolerance.    Time 6   Period Weeks   Status Achieved               Plan - 04/10/16 1400    Clinical Impression Statement Today's session focused on updating her HEP to improve flexibility and UE strength to assist with overhead activity. Pt continues to report improvements in understanding and performance of her HEP and is demonstrating the ability to self-manage her symptoms at home with trigger point release techniques. Ended session with good understanding of HEP and reporting resolution of all pain/symptoms following manual techniques.    Rehab Potential Good   PT Frequency 2x / week  decrease to 1x/week as pt symptoms improve and  she becomes more independent  with HEP   PT Duration 6 weeks   PT Treatment/Interventions ADLs/Self Care Home Management;Moist Heat;Stair training;Functional mobility training;Therapeutic activities;Neuromuscular re-education;Patient/family education;Therapeutic exercise;Balance training;Manual techniques;Dry needling;Passive range of motion   PT Next Visit Plan review blackburn 6, standing TB scapular strengthening   PT Home Exercise Plan hamstring stretch, hip flexor stretch, blackburn 6   Consulted and Agree with Plan of Care Patient      Patient will benefit from skilled therapeutic intervention in order to improve the following deficits and impairments:  Decreased activity tolerance, Decreased mobility, Decreased strength, Decreased range of motion, Impaired flexibility, Pain, Increased muscle spasms, Hypomobility  Visit Diagnosis: Pain in thoracic spine  Muscle weakness (generalized)  Other symptoms and signs involving the musculoskeletal system  Stiffness of left knee, not elsewhere classified  Stiffness of right knee, not elsewhere classified     Problem List Patient Active Problem List   Diagnosis Date Noted  . Abdominal pain, epigastric 09/18/2015  . FH: colon cancer 09/18/2015  . Osteoporosis 05/20/2015  . Moderate single current episode of major depressive disorder ([place]) 02/18/2015  . S/P right UKR 07/02/2014  . Status post unilateral knee replacement 07/02/2014  . Asthma, mild intermittent 02/06/2014  . Lung nodule 02/06/2014  . Hyperlipidemia 01/18/2014  . Expected blood loss anemia 05/10/2013  . Overweight (BMI 25.0-29.9) 05/10/2013  . S/P right THA, AA 05/09/2013  . Pre-operative respiratory examination 05/07/2013  . Post-nasal drainage 05/07/2013  . Endometrial polyp 02/28/2013  . Osteoarthritis of right hip 02/15/2013  . Precordial pain 12/07/2012  . Chronic back pain 08/17/2012  . Hypothyroid 08/17/2012  . Osteopenia 08/17/2012  . Hypersomnia  11/18/2011  . Asthma, mild persistent 08/05/2011  . Memory change 08/05/2011  . TIA (transient ischemic attack) 08/05/2011  . Pulmonary nodule 08/05/2011  . SOB (shortness of breath) 01/05/2011   2:12 PM,04/10/16 Elly Modena PT, DPT [facility] [address] [place], [place], [address] Phone: [phone]   Fax:  [phone]  Name: Jenna Contreras MRN: [id number] Date of Birth: [birth year]

## 2016-04-13 ENCOUNTER — Telehealth (HOSPITAL_COMMUNITY): Payer: Self-pay | Admitting: Family Medicine

## 2016-04-13 ENCOUNTER — Ambulatory Visit (HOSPITAL_COMMUNITY): Payer: Medicare Other | Admitting: Physical Therapy

## 2016-04-13 NOTE — Telephone Encounter (Signed)
04/13/16 pt cx - said that she was sick this morning

## 2016-04-17 ENCOUNTER — Ambulatory Visit (HOSPITAL_COMMUNITY): Payer: Medicare Other | Admitting: Physical Therapy

## 2016-04-17 DIAGNOSIS — M25662 Stiffness of left knee, not elsewhere classified: Secondary | ICD-10-CM

## 2016-04-17 DIAGNOSIS — R29898 Other symptoms and signs involving the musculoskeletal system: Secondary | ICD-10-CM | POA: Diagnosis not present

## 2016-04-17 DIAGNOSIS — M25661 Stiffness of right knee, not elsewhere classified: Secondary | ICD-10-CM

## 2016-04-17 DIAGNOSIS — M6281 Muscle weakness (generalized): Secondary | ICD-10-CM | POA: Diagnosis not present

## 2016-04-17 DIAGNOSIS — M546 Pain in thoracic spine: Secondary | ICD-10-CM | POA: Diagnosis not present

## 2016-04-17 NOTE — Therapy (Signed)
[facility] [address] [place], [place], [address] Phone: [phone]   Fax:  [phone]  Physical Therapy Treatment  Patient Details  Name: Jenna Contreras MRN: [id number] Date of Birth: 22-Apr-1945 Referring Provider: Sallee Lange, MD  Encounter Date: 04/17/2016      PT End of Session - 04/17/16 1109    Visit Number 8   Number of Visits 13   Date for PT Re-Evaluation 05/01/16   Authorization Type Medicare A/B and tricare   Authorization Time Period 03/16/16 to 05/02/16   PT Start Time 1034   PT Stop Time 1113   PT Time Calculation (min) 39 min   Activity Tolerance No increased pain;Patient tolerated treatment well   Behavior During Therapy [facility] for tasks assessed/performed      Past Medical History:  Diagnosis Date  . Anxiety   . Arthritis   . Arthritis   . Asthma   . Bipolar 2 disorder ([place])   . Cancer (HCC)    Basal Cell  . Constipation   . Depression   . Fibromyalgia   . GERD (gastroesophageal reflux disease)   . Heart murmur    As small child   . History of gout   . History of skin cancer   . HNP (herniated nucleus pulposus with myelopathy), thoracic   . Hypothyroidism   . Left eye injury    In ED 06/17/2014   . Low BP   . Migraines   . OCD (obsessive compulsive disorder)   . Osteoporosis   . Psoriasis   . Scoliosis     Past Surgical History:  Procedure Laterality Date  . Arthroscopic left knee surgery    . DILATION AND CURETTAGE OF UTERUS  x3  . HIP SURGERY Right 2015  . iliac wings    . KNEE SURGERY Bilateral 2015, 2016  . MANDIBLE SURGERY     X 2  . PARTIAL KNEE ARTHROPLASTY Left 02/12/2014   Procedure: LEFT KNEE UNICOMPARTMENTAL MEDIALLY ARTHROPLASTY ;  Surgeon: Mauri Pole, MD;  Location: [facility];  Service: Orthopedics;  Laterality: Left;  . PARTIAL KNEE ARTHROPLASTY Right 07/02/2014   Procedure: RIGHT UNI KNEE ARTHOPLASTY MEDIALLY;  Surgeon: Paralee Cancel, MD;  Location: [facility];  Service:  Orthopedics;  Laterality: Right;  . rod in spine     scoliosis   . TONSILLECTOMY    . TOTAL HIP ARTHROPLASTY Right 05/09/2013   Procedure: RIGHT TOTAL HIP ARTHROPLASTY ANTERIOR APPROACH;  Surgeon: Mauri Pole, MD;  Location: [facility];  Service: Orthopedics;  Laterality: Right;  . TUBAL LIGATION    . WRIST SURGERY      There were no vitals filed for this visit.      Subjective Assessment - 04/17/16 1036    Subjective Pt reports things are going well. She had an ordeal while cleaning her house the other day. Overall, her back is feeling ok but she has not been doing her exercises because of the headaches from the gas can she spilled in her house.    Pertinent History Rt hip replacement '15, B knee replacement '15/'16, scoiosis rod and iliac wings '16   Patient Stated Goals return to driving and decrease pain    Currently in Pain? No/denies   Pain Onset More than a month ago  following back surgery                         [facility] Adult PT Treatment/Exercise -  04/17/16 0001      Shoulder Exercises: Seated   External Rotation Both;15 reps   External Rotation Limitations using red TB   Other Seated Exercises PNF D2 flexion with red TB x8 reps each      Shoulder Exercises: Prone   Retraction Both;15 reps   Other Prone Exercises blackburn 6: x15 reps each      Shoulder Exercises: Standing   Other Standing Exercises PNF D2 flexion with each UE x15 reps, red TB     Manual Therapy   Manual therapy comments separate rest of session    Joint Mobilization Lt and Rt scapular mobilizations superior/inferior; upward/downward rotation.                 PT Education - 04/17/16 1228    Education provided Yes   Education Details updated HEP and encouraged increased adherence to address muscle spasm   Person(s) Educated Patient   Methods Explanation;Handout;Verbal cues   Comprehension Verbalized understanding;Returned demonstration          PT Short Term Goals -  04/06/16 1216      PT SHORT TERM GOAL #1   Title Pt will demo consistency and independence with her HEP to improve flexibility and strength    Time 2   Period Weeks   Status Achieved     PT SHORT TERM GOAL #2   Title Pt will demo improved hamstring flexibility to lacking no more than 30 deg on her RLE, to improve sitting posture and knee flexibility.    Baseline 35 deg lacking    Time 3   Period Weeks   Status Partially Met           PT Long Term Goals - 04/06/16 1215      PT LONG TERM GOAL #1   Title Pt will demo improved B seated hip flexor strength to atleast 4-/5 MMT, to allow her safely/quickly switch pedals during driving.    Baseline hip flexor strength less than 4-/5   Time 6   Period Weeks   Status Partially Met     PT LONG TERM GOAL #2   Title Pt will demo improved remaining BLE strength to atleast 4/5 MMT to improve safety with functional activity.   Time 6   Period Weeks   Status Achieved     PT LONG TERM GOAL #3   Title Pt will demo improved hamstring flexibility to lacking no more than 20 deg, BLE, to allow her to long sit in bed to perform nightly reading.    Baseline lacking more than 20 deg   Time 6   Period Weeks   Status Partially Met     PT LONG TERM GOAL #4   Title Pt will report no more than 3/10 pain in her upper back throughout the day, to improve general activity tolerance.    Time 6   Period Weeks   Status Achieved               Plan - 04/17/16 1229    Clinical Impression Statement Continued this session with exercise to improve shoulder and scapular strength. Pt was able to perform prone rotator cuff exercises with improved technique this visit. Ended with some manual scapular mobilizations to improve relaxation. Although she is demonstrating improved independence with self trigger point release techniques at home, we discussed the option of contacting a massage therapist for management of her muscle spasm once discharged. Pt  verbalized understanding of this. Updated her HEP  without any report of pain/symptoms. Will continue with current POC.   Rehab Potential Good   PT Frequency 2x / week  decrease to 1x/week as pt symptoms improve and she becomes more independent with HEP   PT Duration 6 weeks   PT Treatment/Interventions ADLs/Self Care Home Management;Moist Heat;Stair training;Functional mobility training;Therapeutic activities;Neuromuscular re-education;Patient/family education;Therapeutic exercise;Balance training;Manual techniques;Dry needling;Passive range of motion   PT Next Visit Plan review blackburn 6, standing TB scapular strengthening   PT Home Exercise Plan hamstring stretch, hip flexor stretch, blackburn 6, shoulder ER with red TB   Consulted and Agree with Plan of Care Patient      Patient will benefit from skilled therapeutic intervention in order to improve the following deficits and impairments:  Decreased activity tolerance, Decreased mobility, Decreased strength, Decreased range of motion, Impaired flexibility, Pain, Increased muscle spasms, Hypomobility  Visit Diagnosis: Pain in thoracic spine  Muscle weakness (generalized)  Other symptoms and signs involving the musculoskeletal system  Stiffness of left knee, not elsewhere classified  Stiffness of right knee, not elsewhere classified     Problem List Patient Active Problem List   Diagnosis Date Noted  . Abdominal pain, epigastric 09/18/2015  . FH: colon cancer 09/18/2015  . Osteoporosis 05/20/2015  . Moderate single current episode of major depressive disorder ([place]) 02/18/2015  . S/P right UKR 07/02/2014  . Status post unilateral knee replacement 07/02/2014  . Asthma, mild intermittent 02/06/2014  . Lung nodule 02/06/2014  . Hyperlipidemia 01/18/2014  . Expected blood loss anemia 05/10/2013  . Overweight (BMI 25.0-29.9) 05/10/2013  . S/P right THA, AA 05/09/2013  . Pre-operative respiratory examination 05/07/2013  .  Post-nasal drainage 05/07/2013  . Endometrial polyp 02/28/2013  . Osteoarthritis of right hip 02/15/2013  . Precordial pain 12/07/2012  . Chronic back pain 08/17/2012  . Hypothyroid 08/17/2012  . Osteopenia 08/17/2012  . Hypersomnia 11/18/2011  . Asthma, mild persistent 08/05/2011  . Memory change 08/05/2011  . TIA (transient ischemic attack) 08/05/2011  . Pulmonary nodule 08/05/2011  . SOB (shortness of breath) 01/05/2011   12:37 PM,04/17/16 Elly Modena PT, DPT [facility] [address] [place], [place], [address] Phone: [phone]   Fax:  [phone]  Name: Jenna Contreras MRN: [id number] Date of Birth: [birth year]

## 2016-04-20 ENCOUNTER — Ambulatory Visit (HOSPITAL_COMMUNITY): Payer: Medicare Other | Admitting: Physical Therapy

## 2016-04-20 DIAGNOSIS — M546 Pain in thoracic spine: Secondary | ICD-10-CM | POA: Diagnosis not present

## 2016-04-20 DIAGNOSIS — M25661 Stiffness of right knee, not elsewhere classified: Secondary | ICD-10-CM | POA: Diagnosis not present

## 2016-04-20 DIAGNOSIS — R29898 Other symptoms and signs involving the musculoskeletal system: Secondary | ICD-10-CM | POA: Diagnosis not present

## 2016-04-20 DIAGNOSIS — M25662 Stiffness of left knee, not elsewhere classified: Secondary | ICD-10-CM

## 2016-04-20 DIAGNOSIS — M6281 Muscle weakness (generalized): Secondary | ICD-10-CM | POA: Diagnosis not present

## 2016-04-20 NOTE — Therapy (Signed)
[place], [place], [address] Phone: [phone]   Fax:  [phone]  Physical Therapy Treatment  Patient Details  Name: Jenna Contreras MRN: [id number] Date of Birth: 11-Oct-1945 Referring Provider: Sallee Lange, MD  Encounter Date: 04/20/2016      PT End of Session - 04/20/16 1221    Visit Number 9   Number of Visits 13   Date for PT Re-Evaluation 05/01/16   Authorization Type Medicare A/B and tricare   Authorization Time Period 03/16/16 to 05/02/16   PT Start Time 1032   PT Stop Time 1114   PT Time Calculation (min) 42 min   Activity Tolerance No increased pain;Patient tolerated treatment well   Behavior During Therapy [facility] for tasks assessed/performed      Past Medical History:  Diagnosis Date  . Anxiety   . Arthritis   . Arthritis   . Asthma   . Bipolar 2 disorder ([place])   . Cancer (HCC)    Basal Cell  . Constipation   . Depression   . Fibromyalgia   . GERD (gastroesophageal reflux disease)   . Heart murmur    As small child   . History of gout   . History of skin cancer   . HNP (herniated nucleus pulposus with myelopathy), thoracic   . Hypothyroidism   . Left eye injury    In ED 06/17/2014   . Low BP   . Migraines   . OCD (obsessive compulsive disorder)   . Osteoporosis   . Psoriasis   . Scoliosis     Past Surgical History:  Procedure Laterality Date  . Arthroscopic left knee surgery    . DILATION AND CURETTAGE OF UTERUS  x3  . HIP SURGERY Right 2015  . iliac wings    . KNEE SURGERY Bilateral 2015, 2016  . MANDIBLE SURGERY     X 2  . PARTIAL KNEE ARTHROPLASTY Left 02/12/2014   Procedure: LEFT KNEE UNICOMPARTMENTAL MEDIALLY ARTHROPLASTY ;  Surgeon: Mauri Pole, MD;  Location: [facility];  Service: Orthopedics;  Laterality: Left;  . PARTIAL KNEE ARTHROPLASTY Right 07/02/2014   Procedure: RIGHT UNI KNEE ARTHOPLASTY MEDIALLY;  Surgeon: Paralee Cancel, MD;  Location: [facility];  Service:  Orthopedics;  Laterality: Right;  . rod in spine     scoliosis   . TONSILLECTOMY    . TOTAL HIP ARTHROPLASTY Right 05/09/2013   Procedure: RIGHT TOTAL HIP ARTHROPLASTY ANTERIOR APPROACH;  Surgeon: Mauri Pole, MD;  Location: [facility];  Service: Orthopedics;  Laterality: Right;  . TUBAL LIGATION    . WRIST SURGERY      There were no vitals filed for this visit.      Subjective Assessment - 04/20/16 1042    Subjective Pt continues to perform her HEP and reports some thoracic stiffness/soreness following her last session. She feels that she continues to make progress.    Pertinent History Rt hip replacement '15, B knee replacement '15/'16, scoiosis rod and iliac wings '16   Patient Stated Goals return to driving and decrease pain    Currently in Pain? No/denies   Pain Onset More than a month ago  following back surgery                         [facility] Adult PT Treatment/Exercise - 04/20/16 0001      Shoulder Exercises: Seated   Row Both   Theraband Level (Shoulder Row) Level  2 (Red)   Row Limitations x2 sets    External Rotation Both;10 reps   Theraband Level (Shoulder External Rotation) Level 3 (Green)   External Rotation Limitations against the wall      Shoulder Exercises: Prone   Other Prone Exercises blackburn 6 x10 reps, each LE #1 weight      Shoulder Exercises: Standing   Other Standing Exercises cross arm stretch with self trigger point release agains the wall, 2x30 sec      Manual Therapy   Manual Therapy Soft tissue mobilization   Manual therapy comments separate rest of session    Soft tissue mobilization STM thoracic paraspinals, subscapularis on the Rt                PT Education - 04/20/16 1219    Education provided Yes   Education Details discussed ongoing limitations as a result of pt's spinal fusion and importance of using techniques learned during PT at home to manage pain and muscle spasm; also discussed upcoming re-evaluation and  finding massage therapist's in the area to address muscle spasm/pain throughout the year.    Person(s) Educated Patient   Methods Explanation;Verbal cues   Comprehension Verbalized understanding;Returned demonstration          PT Short Term Goals - 04/06/16 1216      PT SHORT TERM GOAL #1   Title Pt will demo consistency and independence with her HEP to improve flexibility and strength    Time 2   Period Weeks   Status Achieved     PT SHORT TERM GOAL #2   Title Pt will demo improved hamstring flexibility to lacking no more than 30 deg on her RLE, to improve sitting posture and knee flexibility.    Baseline 35 deg lacking    Time 3   Period Weeks   Status Partially Met           PT Long Term Goals - 04/06/16 1215      PT LONG TERM GOAL #1   Title Pt will demo improved B seated hip flexor strength to atleast 4-/5 MMT, to allow her safely/quickly switch pedals during driving.    Baseline hip flexor strength less than 4-/5   Time 6   Period Weeks   Status Partially Met     PT LONG TERM GOAL #2   Title Pt will demo improved remaining BLE strength to atleast 4/5 MMT to improve safety with functional activity.   Time 6   Period Weeks   Status Achieved     PT LONG TERM GOAL #3   Title Pt will demo improved hamstring flexibility to lacking no more than 20 deg, BLE, to allow her to long sit in bed to perform nightly reading.    Baseline lacking more than 20 deg   Time 6   Period Weeks   Status Partially Met     PT LONG TERM GOAL #4   Title Pt will report no more than 3/10 pain in her upper back throughout the day, to improve general activity tolerance.    Time 6   Period Weeks   Status Achieved               Plan - 04/20/16 1223    Clinical Impression Statement Today's session continued with therex to improve shoulder and scapular muscle strength. She was able to complete several of her HEP exercises without significant adjustments or cues and she was able to  perform with increased  resistance reporting no increase in pain or discomfort. Educated the pt on the importance of using the techniques learned during PT to address pain and muscle spasm from here on out and she verbalized understanding and agreement with this. Will perform re-evaluation at her next appointment to determine progress towards goals, etc.    Rehab Potential Good   PT Frequency 2x / week  decrease to 1x/week as pt symptoms improve and she becomes more independent with HEP   PT Duration 6 weeks   PT Treatment/Interventions ADLs/Self Care Home Management;Moist Heat;Stair training;Functional mobility training;Therapeutic activities;Neuromuscular re-education;Patient/family education;Therapeutic exercise;Balance training;Manual techniques;Dry needling;Passive range of motion   PT Next Visit Plan re-evaluation; info regarding massage therapists in the area    PT Home Exercise Plan hamstring stretch, hip flexor stretch, blackburn 6, shoulder ER with red TB   Consulted and Agree with Plan of Care Patient      Patient will benefit from skilled therapeutic intervention in order to improve the following deficits and impairments:  Decreased activity tolerance, Decreased mobility, Decreased strength, Decreased range of motion, Impaired flexibility, Pain, Increased muscle spasms, Hypomobility  Visit Diagnosis: Pain in thoracic spine  Other symptoms and signs involving the musculoskeletal system  Stiffness of left knee, not elsewhere classified  Stiffness of right knee, not elsewhere classified  Muscle weakness (generalized)     Problem List Patient Active Problem List   Diagnosis Date Noted  . Abdominal pain, epigastric 09/18/2015  . FH: colon cancer 09/18/2015  . Osteoporosis 05/20/2015  . Moderate single current episode of major depressive disorder ([place]) 02/18/2015  . S/P right UKR 07/02/2014  . Status post unilateral knee replacement 07/02/2014  . Asthma, mild intermittent  02/06/2014  . Lung nodule 02/06/2014  . Hyperlipidemia 01/18/2014  . Expected blood loss anemia 05/10/2013  . Overweight (BMI 25.0-29.9) 05/10/2013  . S/P right THA, AA 05/09/2013  . Pre-operative respiratory examination 05/07/2013  . Post-nasal drainage 05/07/2013  . Endometrial polyp 02/28/2013  . Osteoarthritis of right hip 02/15/2013  . Precordial pain 12/07/2012  . Chronic back pain 08/17/2012  . Hypothyroid 08/17/2012  . Osteopenia 08/17/2012  . Hypersomnia 11/18/2011  . Asthma, mild persistent 08/05/2011  . Memory change 08/05/2011  . TIA (transient ischemic attack) 08/05/2011  . Pulmonary nodule 08/05/2011  . SOB (shortness of breath) 01/05/2011    12:31 PM,04/20/16 Elly Modena PT, DPT [facility] [place] [address] [place], [place], [address] Phone: [phone]   Fax:  [phone]  Name: Jenna Contreras MRN: [id number] Date of Birth: 17-Sep-1945

## 2016-04-24 ENCOUNTER — Ambulatory Visit (HOSPITAL_COMMUNITY): Payer: Medicare Other | Admitting: Physical Therapy

## 2016-04-28 ENCOUNTER — Encounter (HOSPITAL_COMMUNITY): Payer: Medicare Other | Admitting: Physical Therapy

## 2016-05-01 ENCOUNTER — Ambulatory Visit (HOSPITAL_COMMUNITY): Payer: Medicare Other | Admitting: Physical Therapy

## 2016-05-01 DIAGNOSIS — M546 Pain in thoracic spine: Secondary | ICD-10-CM

## 2016-05-01 DIAGNOSIS — M25661 Stiffness of right knee, not elsewhere classified: Secondary | ICD-10-CM

## 2016-05-01 DIAGNOSIS — M6281 Muscle weakness (generalized): Secondary | ICD-10-CM | POA: Diagnosis not present

## 2016-05-01 DIAGNOSIS — R29898 Other symptoms and signs involving the musculoskeletal system: Secondary | ICD-10-CM | POA: Diagnosis not present

## 2016-05-01 DIAGNOSIS — M25662 Stiffness of left knee, not elsewhere classified: Secondary | ICD-10-CM

## 2016-05-01 NOTE — Therapy (Signed)
[place], [place], [address] Phone: [phone]   Fax:  [phone]  Physical Therapy Treatment/Discharge  Patient Details  Name: Jenna Contreras MRN: [id number] Date of Birth: May 08, 1945 Referring Provider: Sallee Lange, MD  Encounter Date: 05/01/2016      PT End of Session - 05/01/16 1132    Visit Number 10   Number of Visits 13   Date for PT Re-Evaluation 05/01/16   Authorization Type Medicare A/B and tricare   Authorization Time Period 03/16/16 to 05/02/16   PT Start Time 1031   PT Stop Time 1113   PT Time Calculation (min) 42 min   Activity Tolerance No increased pain;Patient tolerated treatment well   Behavior During Therapy [facility] for tasks assessed/performed      Past Medical History:  Diagnosis Date  . Anxiety   . Arthritis   . Arthritis   . Asthma   . Bipolar 2 disorder ([place])   . Cancer (HCC)    Basal Cell  . Constipation   . Depression   . Fibromyalgia   . GERD (gastroesophageal reflux disease)   . Heart murmur    As small child   . History of gout   . History of skin cancer   . HNP (herniated nucleus pulposus with myelopathy), thoracic   . Hypothyroidism   . Left eye injury    In ED 06/17/2014   . Low BP   . Migraines   . OCD (obsessive compulsive disorder)   . Osteoporosis   . Psoriasis   . Scoliosis     Past Surgical History:  Procedure Laterality Date  . Arthroscopic left knee surgery    . DILATION AND CURETTAGE OF UTERUS  x3  . HIP SURGERY Right 2015  . iliac wings    . KNEE SURGERY Bilateral 2015, 2016  . MANDIBLE SURGERY     X 2  . PARTIAL KNEE ARTHROPLASTY Left 02/12/2014   Procedure: LEFT KNEE UNICOMPARTMENTAL MEDIALLY ARTHROPLASTY ;  Surgeon: Mauri Pole, MD;  Location: [facility];  Service: Orthopedics;  Laterality: Left;  . PARTIAL KNEE ARTHROPLASTY Right 07/02/2014   Procedure: RIGHT UNI KNEE ARTHOPLASTY MEDIALLY;  Surgeon: Paralee Cancel, MD;  Location: [facility];   Service: Orthopedics;  Laterality: Right;  . rod in spine     scoliosis   . TONSILLECTOMY    . TOTAL HIP ARTHROPLASTY Right 05/09/2013   Procedure: RIGHT TOTAL HIP ARTHROPLASTY ANTERIOR APPROACH;  Surgeon: Mauri Pole, MD;  Location: [facility];  Service: Orthopedics;  Laterality: Right;  . TUBAL LIGATION    . WRIST SURGERY      There were no vitals filed for this visit.      Subjective Assessment - 05/01/16 1039    Subjective Pt reports that she has made significant improvements and feels she is at a 10/10 overall.    Pertinent History Rt hip replacement '15, B knee replacement '15/'16, scoiosis rod and iliac wings '16   Patient Stated Goals return to driving and decrease pain    Currently in Pain? No/denies   Pain Onset More than a month ago  following back surgery            [facility] PT Assessment - 05/01/16 0001      Assessment   Medical Diagnosis back pain unspecified   Referring Provider Sallee Lange, MD   Next MD Visit end of March    Prior Therapy following all surgeries  Balance Screen   Has the patient fallen in the past 6 months No   Has the patient had a decrease in activity level because of a fear of falling?  No   Is the patient reluctant to leave their home because of a fear of falling?  No     Home [employer] residence     Prior Function   Level of Independence Independent     Cognition   Overall Cognitive Status Within Functional Limits for tasks assessed     Observation/Other Assessments   Focus on Therapeutic Outcomes (FOTO)  32% limited      Sensation   Light Touch Appears Intact     Posture/Postural Control   Posture/Postural Control No significant limitations     Strength   Right Hip Flexion 4/5   Right Hip Extension 4+/5   Right Hip ABduction 4+/5   Left Hip Flexion 4/5   Left Hip Extension 4+/5   Left Hip ABduction 4+/5     Flexibility   Soft Tissue Assessment /Muscle Length yes   Hamstrings Rt: 20  deg, Lt: 10 deg   lacking      Transfers   Five time sit to stand comments  6 sec, no UE  low mat table                              PT Education - 05/01/16 1130    Education provided Yes   Education Details discussed goals and progress since beginning PT; encouraged continued HEP adherence to maintain progress   Person(s) Educated Patient   Methods Explanation   Comprehension Verbalized understanding          PT Short Term Goals - 05/01/16 1056      PT SHORT TERM GOAL #1   Title Pt will demo consistency and independence with her HEP to improve flexibility and strength    Time 2   Period Weeks   Status Achieved     PT SHORT TERM GOAL #2   Title Pt will demo improved hamstring flexibility to lacking no more than 30 deg on her RLE, to improve sitting posture and knee flexibility.    Baseline 10 and 20 deg lacking    Time 3   Period Weeks   Status Achieved           PT Long Term Goals - 05/01/16 1056      PT LONG TERM GOAL #1   Title Pt will demo improved B seated hip flexor strength to atleast 4-/5 MMT, to allow her safely/quickly switch pedals during driving.    Time 6   Period Weeks   Status Achieved     PT LONG TERM GOAL #2   Title Pt will demo improved remaining BLE strength to atleast 4/5 MMT to improve safety with functional activity.   Time 6   Period Weeks   Status Achieved     PT LONG TERM GOAL #3   Title Pt will demo improved hamstring flexibility to lacking no more than 20 deg, BLE, to allow her to long sit in bed to perform nightly reading.    Baseline lacking more than 20 deg   Time 6   Period Weeks   Status Achieved     PT LONG TERM GOAL #4   Title Pt will report no more than 3/10 pain in her upper back throughout the day, to improve  general activity tolerance.    Time 6   Period Weeks   Status Achieved               Plan - 2016-05-24 1227    Clinical Impression Statement Pt has made excellent progress since  beginning PT, having met all of her short and long term goals and reporting significant improvements in her function and activity tolerance. She demonstrates improved BLE strength, flexibility and functional strength as well. She has been diligently performing her HEP to manage muscle spasm and overall demonstrates a better understanding of her limitations. At this time, she is very pleased with her progress and plans to continue with her exercises to maintain her fitness and improve remaining areas of hip weakness. She is agreeable to discharge at this time and is confident in her ability to perform her advanced HEP.     Rehab Potential Good   PT Frequency 2x / week  decrease to 1x/week as pt symptoms improve and she becomes more independent with HEP   PT Duration 6 weeks   PT Treatment/Interventions ADLs/Self Care Home Management;Moist Heat;Stair training;Functional mobility training;Therapeutic activities;Neuromuscular re-education;Patient/family education;Therapeutic exercise;Balance training;Manual techniques;Dry needling;Passive range of motion   PT Next Visit Plan d/c this visit    PT Home Exercise Plan hamstring stretch, hip flexor stretch, blackburn 6, shoulder ER with red TB   Consulted and Agree with Plan of Care Patient      Patient will benefit from skilled therapeutic intervention in order to improve the following deficits and impairments:  Decreased activity tolerance, Decreased mobility, Decreased strength, Decreased range of motion, Impaired flexibility, Pain, Increased muscle spasms, Hypomobility  Visit Diagnosis: Pain in thoracic spine  Other symptoms and signs involving the musculoskeletal system  Stiffness of left knee, not elsewhere classified  Stiffness of right knee, not elsewhere classified  Muscle weakness (generalized)       G-Codes - 24-May-2016 1234    Functional Assessment Tool Used (Outpatient Only) FOTO: 32% limited   Functional Limitation Changing and  maintaining body position   Changing and Maintaining Body Position Goal Status (O8325) At least 20 percent but less than 40 percent impaired, limited or restricted   Changing and Maintaining Body Position Discharge Status (Q9826) At least 20 percent but less than 40 percent impaired, limited or restricted      Problem List Patient Active Problem List   Diagnosis Date Noted  . Abdominal pain, epigastric 09/18/2015  . FH: colon cancer 09/18/2015  . Osteoporosis 05/20/2015  . Moderate single current episode of major depressive disorder ([place]) 02/18/2015  . S/P right UKR 07/02/2014  . Status post unilateral knee replacement 07/02/2014  . Asthma, mild intermittent 02/06/2014  . Lung nodule 02/06/2014  . Hyperlipidemia 01/18/2014  . Expected blood loss anemia 05/10/2013  . Overweight (BMI 25.0-29.9) 05/10/2013  . S/P right THA, AA 05/09/2013  . Pre-operative respiratory examination 05/07/2013  . Post-nasal drainage 05/07/2013  . Endometrial polyp 02/28/2013  . Osteoarthritis of right hip 02/15/2013  . Precordial pain 12/07/2012  . Chronic back pain 08/17/2012  . Hypothyroid 08/17/2012  . Osteopenia 08/17/2012  . Hypersomnia 11/18/2011  . Asthma, mild persistent 08/05/2011  . Memory change 08/05/2011  . TIA (transient ischemic attack) 08/05/2011  . Pulmonary nodule 08/05/2011  . SOB (shortness of breath) 01/05/2011   PHYSICAL THERAPY DISCHARGE SUMMARY  Visits from Start of Care: 10  Current functional level related to goals / functional outcomes: See above for most current functional performance   Remaining deficits: Minimal  limitations remain in hip flexor strength, this should continue to improve with HEP   Education / Equipment: discussed goals and progress since beginning PT; encouraged continued HEP adherence to maintain progress Plan: Patient agrees to discharge.  Patient goals were met. Patient is being discharged due to meeting the stated rehab goals.  ?????        12:36 PM,05/01/16 Elly Modena PT, DPT [facility] [place] [address] [place], [place], [address] Phone: [phone]   Fax:  [phone]  Name: Jenna Contreras MRN: [id number] Date of Birth: 1945/05/19

## 2016-05-04 DIAGNOSIS — E038 Other specified hypothyroidism: Secondary | ICD-10-CM | POA: Diagnosis not present

## 2016-05-04 DIAGNOSIS — J019 Acute sinusitis, unspecified: Secondary | ICD-10-CM | POA: Diagnosis not present

## 2016-05-04 DIAGNOSIS — B9689 Other specified bacterial agents as the cause of diseases classified elsewhere: Secondary | ICD-10-CM | POA: Diagnosis not present

## 2016-05-04 DIAGNOSIS — Z79899 Other long term (current) drug therapy: Secondary | ICD-10-CM | POA: Diagnosis not present

## 2016-05-04 DIAGNOSIS — E784 Other hyperlipidemia: Secondary | ICD-10-CM | POA: Diagnosis not present

## 2016-05-05 ENCOUNTER — Encounter: Payer: Self-pay | Admitting: Family Medicine

## 2016-05-05 LAB — CBC WITH DIFFERENTIAL/PLATELET
BASOS: 1 %
Basophils Absolute: 0.1 10*3/uL (ref 0.0–0.2)
EOS (ABSOLUTE): 0.2 10*3/uL (ref 0.0–0.4)
Eos: 3 %
Hematocrit: 43.3 % (ref 34.0–46.6)
Hemoglobin: 14.5 g/dL (ref 11.1–15.9)
IMMATURE GRANS (ABS): 0 10*3/uL (ref 0.0–0.1)
Immature Granulocytes: 0 %
LYMPHS ABS: 2.8 10*3/uL (ref 0.7–3.1)
Lymphs: 42 %
MCH: 29.1 pg (ref 26.6–33.0)
MCHC: 33.5 g/dL (ref 31.5–35.7)
MCV: 87 fL (ref 79–97)
MONOS ABS: 0.5 10*3/uL (ref 0.1–0.9)
Monocytes: 7 %
NEUTROS ABS: 3.2 10*3/uL (ref 1.4–7.0)
Neutrophils: 47 %
Platelets: 322 10*3/uL (ref 150–379)
RBC: 4.98 x10E6/uL (ref 3.77–5.28)
RDW: 13.6 % (ref 12.3–15.4)
WBC: 6.7 10*3/uL (ref 3.4–10.8)

## 2016-05-05 LAB — LIPID PANEL
CHOLESTEROL TOTAL: 224 mg/dL — AB (ref 100–199)
Chol/HDL Ratio: 2.4 ratio units (ref 0.0–4.4)
HDL: 92 mg/dL (ref >39)
LDL Calculated: 121 mg/dL — ABNORMAL HIGH (ref 0–99)
TRIGLYCERIDES: 55 mg/dL (ref 0–149)
VLDL Cholesterol Cal: 11 mg/dL (ref 5–40)

## 2016-05-05 LAB — BASIC METABOLIC PANEL
BUN / CREAT RATIO: 15 (ref 12–28)
BUN: 15 mg/dL (ref 8–27)
CO2: 25 mmol/L (ref 18–29)
Calcium: 10 mg/dL (ref 8.7–10.3)
Chloride: 99 mmol/L (ref 96–106)
Creatinine, Ser: 1.03 mg/dL — ABNORMAL HIGH (ref 0.57–1.00)
GFR, EST AFRICAN AMERICAN: 63 mL/min/{1.73_m2} (ref >59)
GFR, EST NON AFRICAN AMERICAN: 54 mL/min/{1.73_m2} — AB (ref >59)
Glucose: 92 mg/dL (ref 65–99)
POTASSIUM: 5.1 mmol/L (ref 3.5–5.2)
SODIUM: 143 mmol/L (ref 134–144)

## 2016-05-05 LAB — HEPATIC FUNCTION PANEL
ALBUMIN: 4.6 g/dL (ref 3.5–4.8)
ALK PHOS: 94 IU/L (ref 39–117)
ALT: 21 IU/L (ref 0–32)
AST: 19 IU/L (ref 0–40)
BILIRUBIN TOTAL: 0.6 mg/dL (ref 0.0–1.2)
Bilirubin, Direct: 0.13 mg/dL (ref 0.00–0.40)
Total Protein: 7.4 g/dL (ref 6.0–8.5)

## 2016-05-05 LAB — T4, FREE: Free T4: 1.51 ng/dL (ref 0.82–1.77)

## 2016-05-05 LAB — TSH: TSH: 1.17 u[IU]/mL (ref 0.450–4.500)

## 2016-05-15 ENCOUNTER — Emergency Department (HOSPITAL_COMMUNITY)
Admission: EM | Admit: 2016-05-15 | Discharge: 2016-05-15 | Disposition: A | Discharge status: Home / Self Care | Payer: Medicare Other | Attending: Emergency Medicine | Admitting: Emergency Medicine

## 2016-05-15 ENCOUNTER — Encounter (HOSPITAL_COMMUNITY): Payer: Self-pay | Admitting: Cardiology

## 2016-05-15 ENCOUNTER — Emergency Department (HOSPITAL_COMMUNITY): Payer: Medicare Other

## 2016-05-15 DIAGNOSIS — M542 Cervicalgia: Secondary | ICD-10-CM

## 2016-05-15 DIAGNOSIS — Y999 Unspecified external cause status: Secondary | ICD-10-CM | POA: Insufficient documentation

## 2016-05-15 DIAGNOSIS — J45909 Unspecified asthma, uncomplicated: Secondary | ICD-10-CM | POA: Diagnosis not present

## 2016-05-15 DIAGNOSIS — S199XXA Unspecified injury of neck, initial encounter: Secondary | ICD-10-CM | POA: Diagnosis not present

## 2016-05-15 DIAGNOSIS — R918 Other nonspecific abnormal finding of lung field: Secondary | ICD-10-CM | POA: Diagnosis not present

## 2016-05-15 DIAGNOSIS — E039 Hypothyroidism, unspecified: Secondary | ICD-10-CM | POA: Insufficient documentation

## 2016-05-15 DIAGNOSIS — Z79899 Other long term (current) drug therapy: Secondary | ICD-10-CM | POA: Diagnosis not present

## 2016-05-15 DIAGNOSIS — M549 Dorsalgia, unspecified: Secondary | ICD-10-CM

## 2016-05-15 DIAGNOSIS — Z87891 Personal history of nicotine dependence: Secondary | ICD-10-CM | POA: Diagnosis not present

## 2016-05-15 DIAGNOSIS — Z85828 Personal history of other malignant neoplasm of skin: Secondary | ICD-10-CM | POA: Insufficient documentation

## 2016-05-15 DIAGNOSIS — M546 Pain in thoracic spine: Secondary | ICD-10-CM | POA: Diagnosis not present

## 2016-05-15 DIAGNOSIS — Y9389 Activity, other specified: Secondary | ICD-10-CM | POA: Diagnosis not present

## 2016-05-15 DIAGNOSIS — I700 Atherosclerosis of aorta: Secondary | ICD-10-CM | POA: Diagnosis not present

## 2016-05-15 DIAGNOSIS — W010XXA Fall on same level from slipping, tripping and stumbling without subsequent striking against object, initial encounter: Secondary | ICD-10-CM | POA: Insufficient documentation

## 2016-05-15 DIAGNOSIS — M5489 Other dorsalgia: Secondary | ICD-10-CM | POA: Diagnosis not present

## 2016-05-15 DIAGNOSIS — Y929 Unspecified place or not applicable: Secondary | ICD-10-CM | POA: Diagnosis not present

## 2016-05-15 DIAGNOSIS — M545 Low back pain: Secondary | ICD-10-CM | POA: Diagnosis not present

## 2016-05-15 DIAGNOSIS — W19XXXA Unspecified fall, initial encounter: Secondary | ICD-10-CM

## 2016-05-15 DIAGNOSIS — S0990XA Unspecified injury of head, initial encounter: Secondary | ICD-10-CM

## 2016-05-15 DIAGNOSIS — T148XXA Other injury of unspecified body region, initial encounter: Secondary | ICD-10-CM | POA: Diagnosis not present

## 2016-05-15 MED ORDER — METHOCARBAMOL 500 MG PO TABS: 500.0000 mg | ORAL_TABLET | Freq: Two times a day (BID) | ORAL | 0 refills | Status: DC | PRN

## 2016-05-15 NOTE — Discharge Instructions (Signed)
Take the prescription as directed.  Apply moist heat or ice to the area(s) of discomfort, for 15 minutes at a time, several times per day for the next few days.  Do not fall asleep on a heating or ice pack.  Call your regular medical doctor tomorrow to schedule a follow up appointment in the next 3 days.  Return to the Emergency Department immediately if worsening.

## 2016-05-15 NOTE — ED Triage Notes (Signed)
Fall today while hanging curtains.  States she fell backwards ,.  C/o neck , head and lower back pain.

## 2016-05-15 NOTE — ED Provider Notes (Signed)
[facility] DEPT Provider Note   CSN: [id number] Arrival date & time: 05/15/16  1330     History   Chief Complaint Chief Complaint  Patient presents with  . Fall    HPI Jenna Contreras is a 71 y.o. female.  HPI  Pt was seen at 1340. Per pt, c/o sudden onset and resolution of one episode of slip and fall that occurred PTA. Pt states she was standing and "fell into a chair." Pt states she hit her head, neck and bilat upper back area on the "arm of the chair." Denies LOC, no AMS, no CP/SOB, no abd pain, no N/V/D, no focal motor weakness, no tingling/numbness in extremities.   Past Medical History:  Diagnosis Date  . Anxiety   . Arthritis   . Arthritis   . Asthma   . Bipolar 2 disorder ([place])   . Cancer (HCC)    Basal Cell  . Constipation   . Depression   . Fibromyalgia   . GERD (gastroesophageal reflux disease)   . Heart murmur    As small child   . History of gout   . History of skin cancer   . HNP (herniated nucleus pulposus with myelopathy), thoracic   . Hypothyroidism   . Left eye injury    In ED 06/17/2014   . Low BP   . Migraines   . OCD (obsessive compulsive disorder)   . Osteoporosis   . Psoriasis   . Scoliosis     Patient Active Problem List   Diagnosis Date Noted  . Abdominal pain, epigastric 09/18/2015  . FH: colon cancer 09/18/2015  . Osteoporosis 05/20/2015  . Moderate single current episode of major depressive disorder ([place]) 02/18/2015  . S/P right UKR 07/02/2014  . Status post unilateral knee replacement 07/02/2014  . Asthma, mild intermittent 02/06/2014  . Lung nodule 02/06/2014  . Hyperlipidemia 01/18/2014  . Expected blood loss anemia 05/10/2013  . Overweight (BMI 25.0-29.9) 05/10/2013  . S/P right THA, AA 05/09/2013  . Pre-operative respiratory examination 05/07/2013  . Post-nasal drainage 05/07/2013  . Endometrial polyp 02/28/2013  . Osteoarthritis of right hip 02/15/2013  . Precordial pain 12/07/2012  . Chronic back pain  08/17/2012  . Hypothyroid 08/17/2012  . Osteopenia 08/17/2012  . Hypersomnia 11/18/2011  . Asthma, mild persistent 08/05/2011  . Memory change 08/05/2011  . TIA (transient ischemic attack) 08/05/2011  . Pulmonary nodule 08/05/2011  . SOB (shortness of breath) 01/05/2011    Past Surgical History:  Procedure Laterality Date  . Arthroscopic left knee surgery    . DILATION AND CURETTAGE OF UTERUS  x3  . HIP SURGERY Right 2015  . iliac wings    . KNEE SURGERY Bilateral 2015, 2016  . MANDIBLE SURGERY     X 2  . PARTIAL KNEE ARTHROPLASTY Left 02/12/2014   Procedure: LEFT KNEE UNICOMPARTMENTAL MEDIALLY ARTHROPLASTY ;  Surgeon: Mauri Pole, MD;  Location: [facility];  Service: Orthopedics;  Laterality: Left;  . PARTIAL KNEE ARTHROPLASTY Right 07/02/2014   Procedure: RIGHT UNI KNEE ARTHOPLASTY MEDIALLY;  Surgeon: Paralee Cancel, MD;  Location: [facility];  Service: Orthopedics;  Laterality: Right;  . rod in spine     scoliosis   . TONSILLECTOMY    . TOTAL HIP ARTHROPLASTY Right 05/09/2013   Procedure: RIGHT TOTAL HIP ARTHROPLASTY ANTERIOR APPROACH;  Surgeon: Mauri Pole, MD;  Location: [facility];  Service: Orthopedics;  Laterality: Right;  . TUBAL LIGATION    . WRIST SURGERY  OB History    No data available       Home Medications    Prior to Admission medications   Medication Sig Start Date End Date Taking? Authorizing Provider  albuterol (PROVENTIL HFA;VENTOLIN HFA) 108 (90 Base) MCG/ACT inhaler Inhale 2 puffs into the lungs every 4 (four) hours as needed for wheezing or shortness of breath. 02/26/16  Yes Brand Males, MD  Artificial Tear Ointment (REFRESH P.M. OP) Apply 1 application to eye at bedtime.   Yes Historical Provider, MD  chlorpheniramine (CHLOR-TRIMETON) 4 MG tablet Take 4 mg by mouth 2 (two) times daily as needed for allergies. Reported on 05/20/2015   Yes Historical Provider, MD  Cholecalciferol (VITAMIN D) 2000 UNITS CAPS Take 1 capsule by mouth 2 (two) times daily.  Reported on 09/18/2015   Yes Historical Provider, MD  diclofenac (VOLTAREN) 75 MG EC tablet Take 1 tablet by mouth 2 (two) times daily. 04/12/16  Yes Historical Provider, MD  escitalopram (LEXAPRO) 10 MG tablet Take 10 mg by mouth daily.   Yes Historical Provider, MD  fluticasone (FLONASE) 50 MCG/ACT nasal spray Place 1 spray into both nostrils 2 (two) times daily. Patient taking differently: Place 2 sprays into both nostrils 2 (two) times daily.  10/05/14  Yes Kathyrn Drown, MD  gabapentin (NEURONTIN) 100 MG capsule Take 1 capsule (100 mg total) by mouth 3 (three) times daily. Patient taking differently: Take 100 mg by mouth at bedtime.  03/05/16  Yes Kathyrn Drown, MD  lamoTRIgine (LAMICTAL) 100 MG tablet Take 1 tablet (100 mg total) by mouth 2 (two) times daily. 02/18/15  Yes Kathyrn Drown, MD  levothyroxine (SYNTHROID, LEVOTHROID) 88 MCG tablet Take 1 tablet (88 mcg total) by mouth daily before breakfast. 09/20/15  Yes Scott A Luking, MD  LORazepam (ATIVAN) 0.5 MG tablet TAKE 1 TABLET BY MOUTH TWICE A DAY AS NEEDED FOR ANXIETY Patient taking differently: TAKE 1 TABLET BY MOUTH  THREE TIMES A DAY AS NEEDED FOR ANXIETY 02/04/15  Yes Scott A Luking, MD  MAXALT 10 MG tablet TAKE 1 TABLET ONCE AS NEEDED FOR MIGRAINE. MAY REPEAT IN 2 HOURS IF NEEDED, NO GREATER THAN 4 TABLETS PER WEEK (MUST LAST 90 DAYS) 08/06/15  Yes Scott A Luking, MD  mometasone (ELOCON) 0.1 % cream Apply 1 application topically 2 (two) times daily as needed. 05/20/15  Yes Kathyrn Drown, MD  ondansetron (ZOFRAN) 4 MG tablet Take 1 tablet (4 mg total) by mouth every 8 (eight) hours as needed for nausea or vomiting. 02/04/15  Yes Kathyrn Drown, MD  Polyvinyl Alcohol-Povidone (REFRESH OP) Apply 1 drop to eye 2 (two) times daily as needed (Dry eyes). Reported on 05/20/2015   Yes Historical Provider, MD  traZODone (DESYREL) 50 MG tablet Take 1 1/2 tablet qhs 02/18/15  Yes Kathyrn Drown, MD  chlorzoxazone (PARAFON FORTE DSC) 500 MG tablet  Take 1 tablet (500 mg total) by mouth 3 (three) times daily as needed for muscle spasms. Patient not taking: Reported on 05/15/2016 03/05/16   Kathyrn Drown, MD  HYDROcodone-acetaminophen [facility]) 10-325 MG tablet One tid prn Patient not taking: Reported on 05/15/2016 03/05/16   Kathyrn Drown, MD    Family History Family History  Problem Relation Age of Onset  . Heart disease Mother   . Diabetes Mother   . Colon cancer Father 93  . Heart disease Brother     Social History Social History  Substance Use Topics  . Smoking status: Former Smoker  Packs/day: 2.00    Years: 19.00    Types: Cigarettes    Quit date: 03/10/1979  . Smokeless tobacco: Never Used     Comment: Quit in 1981  . Alcohol use No     Comment: no     Allergies   Prednisone; Adhesive [tape]; Betadine [povidone iodine]; Demerol [meperidine]; Latex; Levaquin [levofloxacin in d5w]; Silvadene [silver sulfadiazine]; Ampicillin; Cephalexin; Doxycycline; Erythromycin; Keflex [cephalexin]; Penicillins; and Tetracyclines & related   Review of Systems Review of Systems ROS: Statement: All systems negative except as marked or noted in the HPI; Constitutional: Negative for fever and chills. ; ; Eyes: Negative for eye pain, redness and discharge. ; ; ENMT: Negative for ear pain, hoarseness, nasal congestion, sinus pressure and sore throat. ; ; Cardiovascular: Negative for chest pain, palpitations, diaphoresis, dyspnea and peripheral edema. ; ; Respiratory: Negative for cough, wheezing and stridor. ; ; Gastrointestinal: Negative for nausea, vomiting, diarrhea, abdominal pain, blood in stool, hematemesis, jaundice and rectal bleeding. . ; ; Genitourinary: Negative for dysuria, flank pain and hematuria. ; ; Musculoskeletal: +head injury, back pain and neck pain. Negative for swelling and deformity.; ; Skin: Negative for pruritus, rash, abrasions, blisters, bruising and skin lesion.; ; Neuro: Negative for headache, lightheadedness and  neck stiffness. Negative for weakness, altered level of consciousness, altered mental status, extremity weakness, paresthesias, involuntary movement, seizure and syncope.      Physical Exam Updated Vital Signs BP 122/77   Pulse (!) 56   Temp 98.2 F (36.8 C) (Oral)   Resp 16   Ht 5\' 2"  (1.575 m)   Wt 127 lb (57.6 kg)   SpO2 100%   BMI 23.23 kg/m   Physical Exam 1345: Physical examination:  Nursing notes reviewed; Vital signs and O2 SAT reviewed;  Constitutional: Well developed, Well nourished, Well hydrated, In no acute distress; Head:  Normocephalic, atraumatic; Eyes: EOMI, PERRL, No scleral icterus; ENMT: Mouth and pharynx normal, Mucous membranes moist; Neck: Supple, Full range of motion, No lymphadenopathy; Cardiovascular: Regular rate and rhythm, No gallop; Respiratory: Breath sounds clear & equal bilaterally, No wheezes.  Speaking full sentences with ease, Normal respiratory effort/excursion; Chest: Nontender, Movement normal; Abdomen: Soft, Nontender, Nondistended, Normal bowel sounds; Genitourinary: No CVA tenderness; Spine:  No midline CS, TS, LS tenderness. +TTP bilat cervical and upper thoracic paraspinal muscles. No soft tissue crepitus, no deformity.;; Extremities: Pulses normal, No tenderness, No edema, No calf edema or asymmetry.; Neuro: AA&Ox3, Major CN grossly intact.  Speech clear. No gross focal motor or sensory deficits in extremities.; Skin: Color normal, Warm, Dry.   ED Treatments / Results  Labs (all labs ordered are listed, but only abnormal results are displayed)   EKG  EKG Interpretation None       Radiology   Procedures Procedures (including critical care time)  Medications Ordered in ED Medications - No data to display   Initial Impression / Assessment and Plan / ED Course  I have reviewed the triage vital signs and the nursing notes.  Pertinent labs & imaging results that were available during my care of the patient were reviewed by me and  considered in my medical decision making (see chart for details).  MDM Reviewed: previous chart, nursing note and vitals Interpretation: CT scan    Ct Head Wo Contrast Result Date: 05/15/2016 CLINICAL DATA:  Status post fall.  Back pain. EXAM: CT HEAD WITHOUT CONTRAST CT CERVICAL SPINE WITHOUT CONTRAST TECHNIQUE: Multidetector CT imaging of the head and cervical spine was performed following the  standard protocol without intravenous contrast. Multiplanar CT image reconstructions of the cervical spine were also generated. COMPARISON:  None. FINDINGS: CT HEAD FINDINGS Brain: No evidence of acute infarction, hemorrhage, hydrocephalus, extra-axial collection or mass lesion/mass effect. Vascular: No hyperdense vessel or unexpected calcification. Skull: No osseous abnormality. Sinuses/Orbits: Visualized paranasal sinuses are clear. Visualized mastoid sinuses are clear. Visualized orbits demonstrate no focal abnormality. Other: None CT CERVICAL SPINE FINDINGS Alignment: 2 mm anterolisthesis of C4 on C5 secondary to facet disease. Skull base and vertebrae: No acute fracture. No primary bone lesion or focal pathologic process. Soft tissues and spinal canal: No prevertebral fluid or swelling. No visible canal hematoma. Disc levels: Mild degenerative disc disease with disc height loss at C4-5 and C5-6. Moderate right facet arthropathy at C2-3. Moderate left facet arthropathy at C3-4. Moderate left facet arthropathy at C4-5. Moderate left facet arthropathy at C5-6. Upper chest: Patchy ground-glass opacities of the lung apices. Other: No fluid collection or hematoma. Bilateral carotid artery atherosclerosis. IMPRESSION: 1. No acute intracranial pathology. 2. No acute osseous injury of the cervical spine. Electronically Signed   By: Kathreen Devoid   On: 05/15/2016 16:06   Ct Chest Wo Contrast Result Date: 05/15/2016 CLINICAL DATA:  Fall today while hanging occurrence. Neck, head and low back pain. EXAM: CT CHEST WITHOUT  CONTRAST TECHNIQUE: Multidetector CT imaging of the chest was performed following the standard protocol without IV contrast. COMPARISON:  Chest CT 12/10/2015. FINDINGS: Cardiovascular: Mild atherosclerosis of the aorta, great vessels and coronary arteries. No evidence of mediastinal hematoma or acute vascular findings on noncontrast imaging. The heart size is normal. There is no pericardial effusion. Mediastinum/Nodes: There are no enlarged mediastinal, hilar or axillary lymph nodes.Hilar assessment is limited by the lack of intravenous contrast, although the hilar contours appear unchanged. The thyroid gland, trachea and esophagus demonstrate no significant findings. Lungs/Pleura: There is no pleural effusion. Scattered tiny pulmonary nodules are stable. The new nodules described on the most recent study in the lingula and left lower lobe are no longer seen. There are no new or enlarging pulmonary nodules. Mild emphysematous changes are present. Upper abdomen: The visualized upper abdomen appears stable without suspicious findings. Musculoskeletal/Chest wall: There is no chest wall mass or suspicious osseous finding. Status post thoracolumbar fusion. The visualized thoracic hardware is intact. There are bilateral breast implants. IMPRESSION: 1. No acute posttraumatic chest findings. 2. Stable mild atherosclerosis. 3. Stable scattered small pleural nodules consistent with benign findings. 4. Previous thoracolumbar fusion, grossly stable. Electronically Signed   By: Richardean Sale M.D.   On: 05/15/2016 16:09   Ct Cervical Spine Wo Contrast Result Date: 05/15/2016 CLINICAL DATA:  Status post fall.  Back pain. EXAM: CT HEAD WITHOUT CONTRAST CT CERVICAL SPINE WITHOUT CONTRAST TECHNIQUE: Multidetector CT imaging of the head and cervical spine was performed following the standard protocol without intravenous contrast. Multiplanar CT image reconstructions of the cervical spine were also generated. COMPARISON:  None.  FINDINGS: CT HEAD FINDINGS Brain: No evidence of acute infarction, hemorrhage, hydrocephalus, extra-axial collection or mass lesion/mass effect. Vascular: No hyperdense vessel or unexpected calcification. Skull: No osseous abnormality. Sinuses/Orbits: Visualized paranasal sinuses are clear. Visualized mastoid sinuses are clear. Visualized orbits demonstrate no focal abnormality. Other: None CT CERVICAL SPINE FINDINGS Alignment: 2 mm anterolisthesis of C4 on C5 secondary to facet disease. Skull base and vertebrae: No acute fracture. No primary bone lesion or focal pathologic process. Soft tissues and spinal canal: No prevertebral fluid or swelling. No visible canal hematoma. Disc levels: Mild  degenerative disc disease with disc height loss at C4-5 and C5-6. Moderate right facet arthropathy at C2-3. Moderate left facet arthropathy at C3-4. Moderate left facet arthropathy at C4-5. Moderate left facet arthropathy at C5-6. Upper chest: Patchy ground-glass opacities of the lung apices. Other: No fluid collection or hematoma. Bilateral carotid artery atherosclerosis. IMPRESSION: 1. No acute intracranial pathology. 2. No acute osseous injury of the cervical spine. Electronically Signed   By: Kathreen Devoid   On: 05/15/2016 16:06    1625:  CT reassuring. Tx symptomatically, f/u PMD. Dx and testing d/w pt and family.  Questions answered.  Verb understanding, agreeable to d/c home with outpt f/u.      Final Clinical Impressions(s) / ED Diagnoses   Final diagnoses:  None    New Prescriptions New Prescriptions   No medications on file     Francine Graven, DO 05/20/16 0354

## 2016-05-28 ENCOUNTER — Encounter: Payer: Self-pay | Admitting: Family Medicine

## 2016-05-28 ENCOUNTER — Ambulatory Visit (INDEPENDENT_AMBULATORY_CARE_PROVIDER_SITE_OTHER): Payer: Medicare Other | Admitting: Family Medicine

## 2016-05-28 VITALS — BP 128/82 | Ht 63.0 in | Wt 129.2 lb

## 2016-05-28 DIAGNOSIS — E784 Other hyperlipidemia: Secondary | ICD-10-CM

## 2016-05-28 DIAGNOSIS — E038 Other specified hypothyroidism: Secondary | ICD-10-CM

## 2016-05-28 DIAGNOSIS — E7849 Other hyperlipidemia: Secondary | ICD-10-CM

## 2016-05-28 DIAGNOSIS — M549 Dorsalgia, unspecified: Secondary | ICD-10-CM

## 2016-05-28 MED ORDER — LEVOTHYROXINE SODIUM 88 MCG PO TABS: 88.0000 ug | ORAL_TABLET | Freq: Every day | ORAL | 1 refills | Status: DC

## 2016-05-28 NOTE — Progress Notes (Signed)
   Subjective:    Patient ID: Jenna Contreras, female    DOB: 1945-08-10, 71 y.o.   MRN: [id number]  HPI This patient was seen today for chronic pain  The medication list was reviewed and updated.   -Compliance with medication: no longer taking the pain medication  - Number patient states they take daily: 0  -when was the last dose patient took? Last filled 03/2016 and 97 pills left  The patient was advised the importance of maintaining medication and not using illegal substances with these.  Refills needed: no  The patient was educated that we can provide 3 monthly scripts for their medication, it is their responsibility to follow the instructions.  Side effects or complications from medications:   Patient is aware that pain medications are meant to minimize the severity of the pain to allow their pain levels to improve to allow for better function. They are aware of that pain medications cannot totally remove their pain.  Due for UDT ( at least once per year) : 08/2015  Patient has had fantastic results with physical therapy she states that is helped her get her pain under control and she states she no longer needs to take muscle relaxers or pain medicines. She is using the gabapentin but only one twice daily states overall good control  States her mental health is under good control  She is taking Voltaren as needed for arthritic discomforts and denies any problems  She does take her thyroid medicine on a regular basis does well for her energy level good recent lab work looks good  Hyperlipidemia has an amazing amount of HDL LDL slightly elevated no need for medications      Review of Systems  Constitutional: Negative for activity change and appetite change.  Gastrointestinal: Negative for abdominal pain and vomiting.  Neurological: Negative for weakness.  Psychiatric/Behavioral: Negative for confusion.       Objective:   Physical Exam  Constitutional: She  appears well-nourished. No distress.  HENT:  Head: Normocephalic.  Cardiovascular: Normal rate, regular rhythm and normal heart sounds.   No murmur heard. Pulmonary/Chest: Effort normal and breath sounds normal.  Musculoskeletal: She exhibits no edema.  Lymphadenopathy:    She has no cervical adenopathy.  Neurological: She is alert.  Psychiatric: Her behavior is normal.  Vitals reviewed. 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 there are only 2 mm in size one in the left lower lip one on the left pupil mucosa no sign of any hardness on examination  Oral lesion recheck in 3 to 4 months      Assessment & Plan:  Hyperlipidemia doing great overall watch diet Chronic pain from her back surgeries doing much better no longer needing pain medicine therefore discontinue pain medicine  Thyroid good control takes her medicine lab work looks good  Mental health follows with specialist doing well  Intermittent burning discomfort into the legs from her back Neurontin seems to be helping continue this no more than 2 per day currently  Arthritic discomfort Voltaren when necessary  Follow-up patient in 4 months

## 2016-05-29 ENCOUNTER — Ambulatory Visit: Payer: Medicare Other | Admitting: Family Medicine

## 2016-06-03 ENCOUNTER — Ambulatory Visit: Payer: Medicare Other | Admitting: Family Medicine

## 2016-06-08 DIAGNOSIS — S61551A Open bite of right wrist, initial encounter: Secondary | ICD-10-CM | POA: Diagnosis not present

## 2016-07-23 ENCOUNTER — Ambulatory Visit (INDEPENDENT_AMBULATORY_CARE_PROVIDER_SITE_OTHER): Payer: Medicare Other | Admitting: Family Medicine

## 2016-07-23 ENCOUNTER — Other Ambulatory Visit (HOSPITAL_COMMUNITY)
Admission: RE | Admit: 2016-07-23 | Discharge: 2016-07-23 | Disposition: A | Discharge status: Home / Self Care | Payer: Medicare Other | Source: Ambulatory Visit | Attending: Family Medicine | Admitting: Family Medicine

## 2016-07-23 ENCOUNTER — Encounter: Payer: Self-pay | Admitting: Family Medicine

## 2016-07-23 VITALS — BP 94/60 | Temp 97.6°F | Ht 63.0 in | Wt 129.5 lb

## 2016-07-23 DIAGNOSIS — G8929 Other chronic pain: Secondary | ICD-10-CM | POA: Diagnosis not present

## 2016-07-23 DIAGNOSIS — R1013 Epigastric pain: Secondary | ICD-10-CM | POA: Insufficient documentation

## 2016-07-23 DIAGNOSIS — M546 Pain in thoracic spine: Secondary | ICD-10-CM

## 2016-07-23 LAB — CBC WITH DIFFERENTIAL/PLATELET
Basophils Absolute: 0.1 10*3/uL (ref 0.0–0.1)
Basophils Relative: 1 %
Eosinophils Absolute: 0.2 10*3/uL (ref 0.0–0.7)
Eosinophils Relative: 3 %
HEMATOCRIT: 40.7 % (ref 36.0–46.0)
Hemoglobin: 13.1 g/dL (ref 12.0–15.0)
LYMPHS ABS: 2.5 10*3/uL (ref 0.7–4.0)
LYMPHS PCT: 33 %
MCH: 29.4 pg (ref 26.0–34.0)
MCHC: 32.2 g/dL (ref 30.0–36.0)
MCV: 91.3 fL (ref 78.0–100.0)
Monocytes Absolute: 0.5 10*3/uL (ref 0.1–1.0)
Monocytes Relative: 7 %
NEUTROS ABS: 4.4 10*3/uL (ref 1.7–7.7)
NEUTROS PCT: 56 %
Platelets: 294 10*3/uL (ref 150–400)
RBC: 4.46 MIL/uL (ref 3.87–5.11)
RDW: 13.8 % (ref 11.5–15.5)
WBC: 7.7 10*3/uL (ref 4.0–10.5)

## 2016-07-23 LAB — BASIC METABOLIC PANEL
Anion gap: 9 (ref 5–15)
BUN: 18 mg/dL (ref 6–20)
CALCIUM: 9.5 mg/dL (ref 8.9–10.3)
CO2: 29 mmol/L (ref 22–32)
CREATININE: 0.72 mg/dL (ref 0.44–1.00)
Chloride: 100 mmol/L — ABNORMAL LOW (ref 101–111)
GFR calc non Af Amer: >60 mL/min (ref >60)
Glucose, Bld: 146 mg/dL — ABNORMAL HIGH (ref 65–99)
Potassium: 3.8 mmol/L (ref 3.5–5.1)
SODIUM: 138 mmol/L (ref 135–145)

## 2016-07-23 LAB — LIPASE, BLOOD: Lipase: 24 U/L (ref 11–51)

## 2016-07-23 LAB — HEPATIC FUNCTION PANEL
ALBUMIN: 4.1 g/dL (ref 3.5–5.0)
ALT: 23 U/L (ref 14–54)
AST: 19 U/L (ref 15–41)
Alkaline Phosphatase: 84 U/L (ref 38–126)
BILIRUBIN TOTAL: 0.3 mg/dL (ref 0.3–1.2)
Bilirubin, Direct: <0.1 mg/dL — ABNORMAL LOW (ref 0.1–0.5)
TOTAL PROTEIN: 7.5 g/dL (ref 6.5–8.1)

## 2016-07-23 MED ORDER — PANTOPRAZOLE SODIUM 40 MG PO TBEC: 40.0000 mg | DELAYED_RELEASE_TABLET | Freq: Every day | ORAL | 2 refills | Status: DC

## 2016-07-23 MED ORDER — HYDROCODONE-ACETAMINOPHEN 10-325 MG PO TABS: ORAL_TABLET | ORAL | 0 refills | Status: DC

## 2016-07-23 NOTE — Progress Notes (Signed)
   Subjective:    Patient ID: Jenna Contreras, female    DOB: 06-14-1945, 71 y.o.   MRN: [id number]  Abdominal Pain  This is a new problem. The current episode started in the past 7 days. The pain is located in the epigastric region. Associated symptoms include constipation and diarrhea. Pertinent negatives include no nausea.  Patient relates epigastric pain discomfort some reflux issues intermittent constipation intermittent diarrhea severe epigastric pain the past few days is taking NSAID in addition to this not taking a PPI  She is due for colonoscopy Patient also concerns of back pain Patient on her last visit stated that she was no longer having back pain now she states that she lied about that hoping that she will have to take pain medicine she relates a lot of back pain and discomfort in the mid back radiates up and down her back she's had previous surgery no unilateral numbness or weakness Review of Systems  HENT: Negative for congestion.   Respiratory: Negative for cough and shortness of breath.   Cardiovascular: Negative for chest pain.  Gastrointestinal: Positive for abdominal pain, constipation and diarrhea. Negative for blood in stool and nausea.  Musculoskeletal: Positive for back pain.       Objective:   Physical Exam  Constitutional: She appears well-nourished. No distress.  Cardiovascular: Normal rate, regular rhythm and normal heart sounds.   No murmur heard. Pulmonary/Chest: Effort normal and breath sounds normal. No respiratory distress.  Abdominal: Soft. There is tenderness.  Musculoskeletal: She exhibits no edema.  Lymphadenopathy:    She has no cervical adenopathy.  Neurological: She is alert. She exhibits normal muscle tone.  Psychiatric: Her behavior is normal.  Vitals reviewed.         Assessment & Plan:  Stat lab work ordered Start PPI Use Maalox or Mylanta when necessary Urgent referral gastroenterology would benefit from EGD she would like  to see Dr. Britta Mccreedy at [facility]  Patient was told to go to ER if increased pain lab work ordered await the results  Patient to follow-up in 4 weeks  Chronic back pain I believe it is reasonable to reinstate her pain medicine no greater than 3 per day caution drowsiness not for frequent use maximum use 3 per day #90 follow-up in 3-4 weeks follow-up sooner problems  If she has problems with medicines she will let us know

## 2016-07-24 ENCOUNTER — Encounter: Payer: Self-pay | Admitting: Family Medicine

## 2016-08-20 ENCOUNTER — Encounter: Payer: Self-pay | Admitting: Family Medicine

## 2016-08-20 ENCOUNTER — Ambulatory Visit (INDEPENDENT_AMBULATORY_CARE_PROVIDER_SITE_OTHER): Payer: Medicare Other | Admitting: Family Medicine

## 2016-08-20 VITALS — BP 112/74 | Ht 63.0 in | Wt 128.0 lb

## 2016-08-20 DIAGNOSIS — R1013 Epigastric pain: Secondary | ICD-10-CM

## 2016-08-20 DIAGNOSIS — M549 Dorsalgia, unspecified: Secondary | ICD-10-CM | POA: Diagnosis not present

## 2016-08-20 MED ORDER — CHLORZOXAZONE 500 MG PO TABS: 500.0000 mg | ORAL_TABLET | Freq: Three times a day (TID) | ORAL | 2 refills | Status: DC | PRN

## 2016-08-20 MED ORDER — LORAZEPAM 0.5 MG PO TABS: ORAL_TABLET | ORAL | 1 refills | Status: DC

## 2016-08-20 MED ORDER — ESCITALOPRAM OXALATE 10 MG PO TABS: 10.0000 mg | ORAL_TABLET | Freq: Every day | ORAL | 2 refills | Status: DC

## 2016-08-20 NOTE — Progress Notes (Signed)
   Subjective:    Patient ID: Jenna Contreras, female    DOB: [birth year], 71 y.o.   MRN: [id number]  Abdominal Pain  This is a recurrent problem. The current episode started 1 to 4 weeks ago. The problem has been gradually improving. The pain is located in the epigastric region.  The patient is taking acid blocker. Denies dysphagia denies vomiting diarrhea sweats chills Patient in today for a 4 week follow up on epigastric pain. Review of Systems  Gastrointestinal: Positive for abdominal pain.       Objective:   Physical Exam  Lungs clear heart regular abdomen soft no guarding rebound or tenderness      Assessment & Plan:  Epigastric pain doing better she does need to see gastroenterology but she states that she is going to wait a few days in then call them and get set up for later this summer  Pain control with her back she is using pain medicine sparingly  Anxiety stress depression her psychiatrist left she is in the process of getting a new one refills given she was warned specifically do not take nerve pills with pain pill  Muscle relaxers when necessary for back pain not to take with pain medicine and nerve pills  Follow-up within 4 months

## 2016-09-10 ENCOUNTER — Ambulatory Visit: Payer: Medicare Other | Admitting: Family Medicine

## 2016-09-23 ENCOUNTER — Encounter: Payer: Self-pay | Admitting: Family Medicine

## 2016-09-27 ENCOUNTER — Other Ambulatory Visit: Payer: Self-pay | Admitting: Family Medicine

## 2016-10-09 ENCOUNTER — Telehealth: Payer: Self-pay | Admitting: Family Medicine

## 2016-10-09 NOTE — Telephone Encounter (Signed)
Patient called needing refill on HYDROcodone-acetaminophen ([facility]CO) 10-325 MG tablet Patient advise that it would probably not be today and she said that was fine. [phone]

## 2016-10-12 ENCOUNTER — Other Ambulatory Visit: Payer: Self-pay | Admitting: *Deleted

## 2016-10-12 MED ORDER — HYDROCODONE-ACETAMINOPHEN 10-325 MG PO TABS: ORAL_TABLET | ORAL | 0 refills | Status: DC

## 2016-10-12 NOTE — Telephone Encounter (Signed)
Ok for 75 tablets one tid prn keep follow up oin sept

## 2016-10-12 NOTE — Telephone Encounter (Signed)
Pt.notified

## 2016-11-19 ENCOUNTER — Ambulatory Visit: Payer: Medicare Other | Admitting: Family Medicine

## 2016-11-20 ENCOUNTER — Ambulatory Visit: Payer: Medicare Other | Admitting: Family Medicine

## 2016-11-24 DIAGNOSIS — F319 Bipolar disorder, unspecified: Secondary | ICD-10-CM | POA: Diagnosis not present

## 2016-11-24 DIAGNOSIS — F4310 Post-traumatic stress disorder, unspecified: Secondary | ICD-10-CM | POA: Diagnosis not present

## 2016-11-24 DIAGNOSIS — F411 Generalized anxiety disorder: Secondary | ICD-10-CM | POA: Diagnosis not present

## 2016-11-25 ENCOUNTER — Encounter: Payer: Self-pay | Admitting: Family Medicine

## 2016-11-25 ENCOUNTER — Ambulatory Visit (INDEPENDENT_AMBULATORY_CARE_PROVIDER_SITE_OTHER): Payer: Medicare Other | Admitting: Family Medicine

## 2016-11-25 VITALS — BP 120/78 | Ht 63.0 in | Wt 128.0 lb

## 2016-11-25 DIAGNOSIS — Z79891 Long term (current) use of opiate analgesic: Secondary | ICD-10-CM

## 2016-11-25 DIAGNOSIS — Z23 Encounter for immunization: Secondary | ICD-10-CM

## 2016-11-25 MED ORDER — HYDROCODONE-ACETAMINOPHEN 10-325 MG PO TABS: ORAL_TABLET | ORAL | 0 refills | Status: DC

## 2016-11-25 NOTE — Progress Notes (Signed)
   Subjective:    Patient ID: Jenna Contreras, female    DOB: June 17, 1945, 71 y.o.   MRN: [id number]  HPI  This patient was seen today for chronic pain  The medication list was reviewed and updated.   -Compliance with medication: yes   - Number patient states they take daily: 1-3  -when was the last dose patient took? Yesterday at 3pm  The patient was advised the importance of maintaining medication and not using illegal substances with these.  Refills needed: yes  The patient was educated that we can provide 3 monthly scripts for their medication, it is their responsibility to follow the instructions.  Side effects or complications from medications: none  Patient is aware that pain medications are meant to minimize the severity of the pain to allow their pain levels to improve to allow for better function. They are aware of that pain medications cannot totally remove their pain.  Due for UDT ( at least once per year) : today 11/25/16 Patient has previous back troubles had surgery she has ongoing back pain and discomfort she used to be on fentanyl so she is doing much better being on hydrocodone less symptoms and side effects and tolerating her pain medicine does help her make the pain at a more functional      Review of Systems  Constitutional: Negative for activity change and appetite change.  HENT: Negative for congestion.   Respiratory: Negative for cough.   Cardiovascular: Negative for chest pain.  Gastrointestinal: Negative for abdominal pain and vomiting.  Musculoskeletal: Positive for arthralgias and back pain.  Skin: Negative for color change.  Neurological: Negative for weakness.  Psychiatric/Behavioral: Negative for confusion.       Objective:   Physical Exam  Constitutional: She appears well-nourished. No distress.  HENT:  Head: Normocephalic.  Right Ear: External ear normal.  Left Ear: External ear normal.  Eyes: Right eye exhibits no discharge.  Left eye exhibits no discharge.  Neck: No tracheal deviation present.  Cardiovascular: Normal rate, regular rhythm and normal heart sounds.   No murmur heard. Pulmonary/Chest: Effort normal and breath sounds normal. No respiratory distress. She has no wheezes. She has no rales.  Musculoskeletal: She exhibits no edema.  Lymphadenopathy:    She has no cervical adenopathy.  Neurological: She is alert.  Psychiatric: Her behavior is normal.  Vitals reviewed.  Urine drug screen collected Long discussion held regarding safety of opioids Drug contract discussed in detail One prescription given 2 prescriptions will be held Follow-up in approximate 3-4 months Patient takes approximately 2 or 3 per day       Assessment & Plan:  The patient was seen today as part of a comprehensive visit regarding pain control. Patient's compliance with the medication as well as discussion regarding effectiveness was completed. Prescriptions were written. Patient was advised to follow-up in 3 months. The patient was assessed for any signs of severe side effects. The patient was advised to take the medicine as directed and to report to [place] if any side effect issues.

## 2016-12-01 LAB — TOXASSURE SELECT 13 (MW), URINE

## 2016-12-22 ENCOUNTER — Telehealth: Payer: Self-pay | Admitting: Family Medicine

## 2016-12-22 MED ORDER — CHLORZOXAZONE 500 MG PO TABS: 500.0000 mg | ORAL_TABLET | Freq: Three times a day (TID) | ORAL | 2 refills | Status: DC | PRN

## 2016-12-22 NOTE — Telephone Encounter (Signed)
Pt. requests a refill on: chlorzoxazone (PARAFON FORTE DSC) 500 MG tablet  [facility]

## 2016-12-22 NOTE — Telephone Encounter (Signed)
Spoke with patient and informed him per Dr.Scott Luking-  We are sending in Carlyss patient verbalized understanding.

## 2016-12-22 NOTE — Telephone Encounter (Signed)
Refill2

## 2016-12-23 ENCOUNTER — Other Ambulatory Visit: Payer: Self-pay

## 2016-12-23 MED ORDER — CHLORZOXAZONE 500 MG PO TABS: 500.0000 mg | ORAL_TABLET | Freq: Three times a day (TID) | ORAL | 2 refills | Status: DC | PRN

## 2016-12-23 NOTE — Progress Notes (Unsigned)
Parafon resent into pharmacy due to medication being sent to the wrong pharmacy.

## 2016-12-24 DIAGNOSIS — F411 Generalized anxiety disorder: Secondary | ICD-10-CM | POA: Diagnosis not present

## 2016-12-24 DIAGNOSIS — F4310 Post-traumatic stress disorder, unspecified: Secondary | ICD-10-CM | POA: Diagnosis not present

## 2016-12-24 DIAGNOSIS — F319 Bipolar disorder, unspecified: Secondary | ICD-10-CM | POA: Diagnosis not present

## 2016-12-24 DIAGNOSIS — Z8659 Personal history of other mental and behavioral disorders: Secondary | ICD-10-CM | POA: Diagnosis not present

## 2016-12-31 DIAGNOSIS — H2513 Age-related nuclear cataract, bilateral: Secondary | ICD-10-CM | POA: Diagnosis not present

## 2016-12-31 DIAGNOSIS — H1851 Endothelial corneal dystrophy: Secondary | ICD-10-CM | POA: Diagnosis not present

## 2016-12-31 DIAGNOSIS — H04123 Dry eye syndrome of bilateral lacrimal glands: Secondary | ICD-10-CM | POA: Diagnosis not present

## 2017-01-06 ENCOUNTER — Emergency Department (HOSPITAL_COMMUNITY)
Admission: EM | Admit: 2017-01-06 | Discharge: 2017-01-06 | Disposition: A | Discharge status: Home / Self Care | Payer: Medicare Other | Attending: Emergency Medicine | Admitting: Emergency Medicine

## 2017-01-06 ENCOUNTER — Ambulatory Visit (INDEPENDENT_AMBULATORY_CARE_PROVIDER_SITE_OTHER): Payer: Medicare Other | Admitting: Family Medicine

## 2017-01-06 ENCOUNTER — Encounter (HOSPITAL_COMMUNITY): Payer: Self-pay | Admitting: Emergency Medicine

## 2017-01-06 ENCOUNTER — Emergency Department (HOSPITAL_COMMUNITY): Payer: Medicare Other

## 2017-01-06 DIAGNOSIS — F319 Bipolar disorder, unspecified: Secondary | ICD-10-CM | POA: Diagnosis not present

## 2017-01-06 DIAGNOSIS — Z96641 Presence of right artificial hip joint: Secondary | ICD-10-CM | POA: Insufficient documentation

## 2017-01-06 DIAGNOSIS — J45909 Unspecified asthma, uncomplicated: Secondary | ICD-10-CM | POA: Insufficient documentation

## 2017-01-06 DIAGNOSIS — Y929 Unspecified place or not applicable: Secondary | ICD-10-CM | POA: Diagnosis not present

## 2017-01-06 DIAGNOSIS — Z87891 Personal history of nicotine dependence: Secondary | ICD-10-CM | POA: Insufficient documentation

## 2017-01-06 DIAGNOSIS — E039 Hypothyroidism, unspecified: Secondary | ICD-10-CM | POA: Diagnosis not present

## 2017-01-06 DIAGNOSIS — Z79899 Other long term (current) drug therapy: Secondary | ICD-10-CM | POA: Insufficient documentation

## 2017-01-06 DIAGNOSIS — Z91040 Latex allergy status: Secondary | ICD-10-CM | POA: Insufficient documentation

## 2017-01-06 DIAGNOSIS — Y998 Other external cause status: Secondary | ICD-10-CM | POA: Diagnosis not present

## 2017-01-06 DIAGNOSIS — Z96653 Presence of artificial knee joint, bilateral: Secondary | ICD-10-CM | POA: Insufficient documentation

## 2017-01-06 DIAGNOSIS — Z85828 Personal history of other malignant neoplasm of skin: Secondary | ICD-10-CM | POA: Insufficient documentation

## 2017-01-06 DIAGNOSIS — W5501XA Bitten by cat, initial encounter: Secondary | ICD-10-CM | POA: Diagnosis not present

## 2017-01-06 DIAGNOSIS — Z203 Contact with and (suspected) exposure to rabies: Secondary | ICD-10-CM | POA: Insufficient documentation

## 2017-01-06 DIAGNOSIS — Y9389 Activity, other specified: Secondary | ICD-10-CM | POA: Diagnosis not present

## 2017-01-06 DIAGNOSIS — S81851A Open bite, right lower leg, initial encounter: Secondary | ICD-10-CM | POA: Diagnosis not present

## 2017-01-06 DIAGNOSIS — S81831A Puncture wound without foreign body, right lower leg, initial encounter: Secondary | ICD-10-CM | POA: Diagnosis not present

## 2017-01-06 DIAGNOSIS — M7989 Other specified soft tissue disorders: Secondary | ICD-10-CM | POA: Diagnosis not present

## 2017-01-06 DIAGNOSIS — S61230A Puncture wound without foreign body of right index finger without damage to nail, initial encounter: Secondary | ICD-10-CM | POA: Diagnosis not present

## 2017-01-06 DIAGNOSIS — M419 Scoliosis, unspecified: Secondary | ICD-10-CM | POA: Diagnosis not present

## 2017-01-06 DIAGNOSIS — S61250A Open bite of right index finger without damage to nail, initial encounter: Secondary | ICD-10-CM | POA: Diagnosis not present

## 2017-01-06 DIAGNOSIS — Z23 Encounter for immunization: Secondary | ICD-10-CM | POA: Insufficient documentation

## 2017-01-06 DIAGNOSIS — Z8673 Personal history of transient ischemic attack (TIA), and cerebral infarction without residual deficits: Secondary | ICD-10-CM | POA: Insufficient documentation

## 2017-01-06 MED ORDER — CIPROFLOXACIN HCL 250 MG PO TABS
500.0000 mg | ORAL_TABLET | Freq: Once | ORAL | Status: AC
  Administered 2017-01-06: 500 mg via ORAL
  Filled 2017-01-06: qty 2

## 2017-01-06 MED ORDER — RABIES VACCINE, PCEC IM SUSR
1.0000 mL | Freq: Once | INTRAMUSCULAR | Status: AC
  Administered 2017-01-06: 1 mL via INTRAMUSCULAR
  Filled 2017-01-06: qty 1

## 2017-01-06 MED ORDER — ONDANSETRON HCL 4 MG PO TABS: 4.0000 mg | ORAL_TABLET | Freq: Four times a day (QID) | ORAL | 0 refills | Status: DC

## 2017-01-06 MED ORDER — CLINDAMYCIN HCL 300 MG PO CAPS: 300.0000 mg | ORAL_CAPSULE | Freq: Three times a day (TID) | ORAL | 0 refills | Status: DC

## 2017-01-06 MED ORDER — TETANUS-DIPHTH-ACELL PERTUSSIS 5-2.5-18.5 LF-MCG/0.5 IM SUSP
0.5000 mL | Freq: Once | INTRAMUSCULAR | Status: AC
  Administered 2017-01-06: 0.5 mL via INTRAMUSCULAR
  Filled 2017-01-06: qty 0.5

## 2017-01-06 MED ORDER — RABIES IMMUNE GLOBULIN 150 UNIT/ML IM INJ
20.0000 [IU]/kg | INJECTION | Freq: Once | INTRAMUSCULAR | Status: AC
  Administered 2017-01-06: 1125 [IU]
  Filled 2017-01-06: qty 2

## 2017-01-06 MED ORDER — CIPROFLOXACIN HCL 500 MG PO TABS: 500.0000 mg | ORAL_TABLET | Freq: Two times a day (BID) | ORAL | 0 refills | Status: DC

## 2017-01-06 MED ORDER — POVIDONE-IODINE 10 % EX SOLN
CUTANEOUS | Status: AC
  Filled 2017-01-06: qty 15

## 2017-01-06 MED ORDER — CLINDAMYCIN HCL 150 MG PO CAPS
300.0000 mg | ORAL_CAPSULE | Freq: Once | ORAL | Status: AC
  Administered 2017-01-06: 300 mg via ORAL
  Filled 2017-01-06: qty 2

## 2017-01-06 NOTE — Progress Notes (Signed)
This patient came in today because she had a cat bite to her right hand and also her right lower leg had  a fair amount of bleeding from the leg as well as swelling around where the bite mark occurred. She states that this cat is from the area around her house in she is been feeding this cat over the past 4 months. This cat is not domesticated. This Also has not had any rabies shots. To the best of her knowledge the cat had been acting normal. She states that she was holding the cath in the cat got frightened and her in the hand and then in the leg area she states that there really is no way to catch this cat. On physical exam she has swelling around the lower leg where the cat bite was she also has puncture marks on her hand where cat bite was both of these were cleaned up and dressings applied. Assessment-Bite Risk of rabies because this cat was unvaccinated in deemed to be feral.  Plan-I recommended the patient go to the emergency department for further evaluation. More than likely will need a tetanus shot as well as antibiotics plus also I will I would recommend rabies vaccination especially if this Cannot be found or observed  ER was spoken with and expecting patient

## 2017-01-06 NOTE — ED Provider Notes (Signed)
[facility] EMERGENCY DEPARTMENT Provider Note   CSN: [id number] Arrival date & time: 01/06/17  1407     History   Chief Complaint Chief Complaint  Patient presents with  . Animal Bite    HPI Jenna Contreras is a 71 y.o. female.  HPI   Jenna Contreras is a 71 y.o. female who presents to the Emergency Department complaining of cat bites to right index finger and right lower leg.  Incident occurred shortly prior to arrival.  Patient was seen by her PCP initially and advised to come to ER for care.  Patient states she has been caring for a feral cat for several months and the cat bite her when it's leg became pinned between two wooden boards.  No prior therapies were tried.  She complains of mild bleeding and pain to her finger and lower leg.  Still able to move the finger, but states it feels "sore"  Last tetanus is unknown.  She denies numbness, redness, use of blood thinners.    Past Medical History:  Diagnosis Date  . Anxiety   . Arthritis   . Arthritis   . Asthma   . Bipolar 2 disorder ([place])   . Cancer (HCC)    Basal Cell  . Constipation   . Depression   . Fibromyalgia   . GERD (gastroesophageal reflux disease)   . Heart murmur    As small child   . History of gout   . History of skin cancer   . HNP (herniated nucleus pulposus with myelopathy), thoracic   . Hypothyroidism   . Left eye injury    In ED 06/17/2014   . Low BP   . Migraines   . OCD (obsessive compulsive disorder)   . Osteoporosis   . Psoriasis   . Scoliosis     Patient Active Problem List   Diagnosis Date Noted  . Abdominal pain, epigastric 09/18/2015  . FH: colon cancer 09/18/2015  . Osteoporosis 05/20/2015  . Moderate single current episode of major depressive disorder ([place]) 02/18/2015  . S/P right UKR 07/02/2014  . Status post unilateral knee replacement 07/02/2014  . Asthma, mild intermittent 02/06/2014  . Lung nodule 02/06/2014  . Hyperlipidemia 01/18/2014  . Expected  blood loss anemia 05/10/2013  . Overweight (BMI 25.0-29.9) 05/10/2013  . S/P right THA, AA 05/09/2013  . Pre-operative respiratory examination 05/07/2013  . Post-nasal drainage 05/07/2013  . Endometrial polyp 02/28/2013  . Osteoarthritis of right hip 02/15/2013  . Precordial pain 12/07/2012  . Chronic back pain 08/17/2012  . Hypothyroid 08/17/2012  . Osteopenia 08/17/2012  . Hypersomnia 11/18/2011  . Asthma, mild persistent 08/05/2011  . Memory change 08/05/2011  . TIA (transient ischemic attack) 08/05/2011  . Pulmonary nodule 08/05/2011  . SOB (shortness of breath) 01/05/2011    Past Surgical History:  Procedure Laterality Date  . Arthroscopic left knee surgery    . DILATION AND CURETTAGE OF UTERUS  x3  . HIP SURGERY Right 2015  . iliac wings    . KNEE SURGERY Bilateral 2015, 2016  . MANDIBLE SURGERY     X 2  . PARTIAL KNEE ARTHROPLASTY Left 02/12/2014   Procedure: LEFT KNEE UNICOMPARTMENTAL MEDIALLY ARTHROPLASTY ;  Surgeon: Mauri Pole, MD;  Location: [facility]S;  Service: Orthopedics;  Laterality: Left;  . PARTIAL KNEE ARTHROPLASTY Right 07/02/2014   Procedure: RIGHT UNI KNEE ARTHOPLASTY MEDIALLY;  Surgeon: Paralee Cancel, MD;  Location: [facility];  Service: Orthopedics;  Laterality: Right;  . rod in spine  scoliosis   . TONSILLECTOMY    . TOTAL HIP ARTHROPLASTY Right 05/09/2013   Procedure: RIGHT TOTAL HIP ARTHROPLASTY ANTERIOR APPROACH;  Surgeon: Mauri Pole, MD;  Location: [facility];  Service: Orthopedics;  Laterality: Right;  . TUBAL LIGATION    . WRIST SURGERY      OB History    No data available       Home Medications    Prior to Admission medications   Medication Sig Start Date End Date Taking? Authorizing Provider  albuterol (PROVENTIL HFA;VENTOLIN HFA) 108 (90 Base) MCG/ACT inhaler Inhale 2 puffs into the lungs every 4 (four) hours as needed for wheezing or shortness of breath. 02/26/16   Brand Males, MD  Artificial Tear Ointment (REFRESH P.M. OP) Apply  1 application to eye at bedtime.    [provider]  chlorpheniramine (CHLOR-TRIMETON) 4 MG tablet Take 4 mg by mouth 2 (two) times daily as needed for allergies. Reported on 05/20/2015    [provider]  chlorzoxazone (PARAFON FORTE DSC) 500 MG tablet Take 1 tablet (500 mg total) by mouth 3 (three) times daily as needed for muscle spasms. 12/23/16   Kathyrn Drown, MD  Cholecalciferol (VITAMIN D) 2000 UNITS CAPS Take 1 capsule by mouth 2 (two) times daily. Reported on 09/18/2015    [provider]  escitalopram (LEXAPRO) 10 MG tablet Take 1 tablet (10 mg total) by mouth daily. 08/20/16   Kathyrn Drown, MD  fluticasone (FLONASE) 50 MCG/ACT nasal spray Place 1 spray into both nostrils 2 (two) times daily. Patient taking differently: Place 2 sprays into both nostrils 2 (two) times daily.  10/05/14   Kathyrn Drown, MD  gabapentin (NEURONTIN) 100 MG capsule Take 1 capsule (100 mg total) by mouth 3 (three) times daily. Patient taking differently: Take 100 mg by mouth at bedtime.  03/05/16   Kathyrn Drown, MD  HYDROcodone-acetaminophen [facility]) 10-325 MG tablet One tid prn 11/25/16   Kathyrn Drown, MD  lamoTRIgine (LAMICTAL) 100 MG tablet Take 1 tablet (100 mg total) by mouth 2 (two) times daily. 02/18/15   Kathyrn Drown, MD  levothyroxine (SYNTHROID, LEVOTHROID) 88 MCG tablet Take 1 tablet (88 mcg total) by mouth daily before breakfast. 05/28/16   Luking, Elayne Snare, MD  LORazepam (ATIVAN) 0.5 MG tablet TAKE 1 TABLET BY MOUTH TWICE A DAY AS NEEDED FOR ANXIETY 08/20/16   Luking, Scott A, MD  MAXALT 10 MG tablet TAKE 1 TABLET ONCE AS NEEDED FOR MIGRAINE. MAY REPEAT IN 2 HOURS IF NEEDED, NO GREATER THAN 4 TABLETS PER WEEK (MUST LAST 90 DAYS) 09/28/16   Luking, Scott A, MD  mometasone (ELOCON) 0.1 % cream Apply 1 application topically 2 (two) times daily as needed. 05/20/15   Kathyrn Drown, MD  ondansetron (ZOFRAN) 4 MG tablet Take 1 tablet (4 mg total) by mouth every 8 (eight)  hours as needed for nausea or vomiting. 02/04/15   Kathyrn Drown, MD  pantoprazole (PROTONIX) 40 MG tablet Take 1 tablet (40 mg total) by mouth daily. 07/23/16   Kathyrn Drown, MD  Polyvinyl Alcohol-Povidone (REFRESH OP) Apply 1 drop to eye 2 (two) times daily as needed (Dry eyes). Reported on 05/20/2015    [provider]    Family History Family History  Problem Relation Age of Onset  . Heart disease Mother   . Diabetes Mother   . Colon cancer Father 43  . Heart disease Brother     Social History Social History  Substance Use Topics  . Smoking status: Former Smoker    Packs/day: 2.00    Years: 19.00    Types: Cigarettes    Quit date: 03/10/1979  . Smokeless tobacco: Never Used     Comment: Quit in 1981  . Alcohol use No     Comment: no     Allergies   Prednisone; Adhesive [tape]; Betadine [povidone iodine]; Demerol [meperidine]; Latex; Levaquin [levofloxacin in d5w]; Silvadene [silver sulfadiazine]; Ampicillin; Cephalexin; Doxycycline; Erythromycin; Keflex [cephalexin]; Penicillins; and Tetracyclines & related   Review of Systems Review of Systems  Constitutional: Negative for chills and fever.  Musculoskeletal: Positive for arthralgias and myalgias.  Skin: Positive for wound.       Puncture wounds to right lower leg and right index finger  Neurological: Negative for dizziness, weakness and numbness.  Hematological: Does not bruise/bleed easily.  All other systems reviewed and are negative.    Physical Exam Updated Vital Signs BP (!) 114/50 (BP Location: Right Arm)   Pulse 67   Temp 98.2 F (36.8 C) (Oral)   Resp 18   Ht 5' 3.5" (1.613 m)   Wt 56.7 kg (125 lb)   SpO2 95%   BMI 21.80 kg/m   Physical Exam  Constitutional: She is oriented to person, place, and time. She appears well-developed and well-nourished. No distress.  HENT:  Head: Normocephalic and atraumatic.  Cardiovascular: Normal rate, regular rhythm and intact distal pulses.   No  murmur heard. Pulmonary/Chest: Effort normal and breath sounds normal. No respiratory distress.  Musculoskeletal: She exhibits tenderness. She exhibits no edema.  Pt has full ROM of the index finger and RLE.  Mild edema of the finger.  Bleeding conterolled  Neurological: She is alert and oriented to person, place, and time. No sensory deficit. She exhibits normal muscle tone. Coordination normal.  Skin: Skin is warm. Capillary refill takes less than 2 seconds. Laceration noted.  Several puncture wounds to the mid right index finger and proximal, lateral right lower leg.    Psychiatric: She has a normal mood and affect.  Nursing note and vitals reviewed.         Pt gave verbal approval for photos.  Understands that images are for clinical use and no images were stored on any devices.    ED Treatments / Results  Labs (all labs ordered are listed, but only abnormal results are displayed) Labs Reviewed - No data to display  EKG  EKG Interpretation None       Radiology Dg Finger Index Right  Result Date: 01/06/2017 CLINICAL DATA:  Recent CAT bite with pain and swelling EXAM: RIGHT INDEX FINGER 2+V COMPARISON:  None. FINDINGS: Degenerative changes of the interphalangeal joints are seen. Mild soft tissue swelling is noted related to the recent injury. No subcutaneous air is seen. No acute fracture is seen. No foreign body is noted. IMPRESSION: Soft tissue swelling and degenerative change without acute bony abnormality. Electronically Signed   By: Inez Catalina M.D.   On: 01/06/2017 16:25    Procedures Procedures (including critical care time)   Puncture wounds were cleaned with saline and irrigated thoroughly by me using saline.  Pt tolerated procedure well. Wounds bandaged by nursing.    Medications Ordered in ED Medications  Tdap (BOOSTRIX) injection 0.5 mL (not administered)  rabies vaccine (RABAVERT) injection 1 mL (not administered)  rabies immune globulin  (HYPERAB/KEDRAB) injection 1,125 Units (not administered)  povidone-iodine (BETADINE) 10 % external solution (  Refused 01/06/17 1500)  ciprofloxacin (CIPRO)  tablet 500 mg (not administered)  clindamycin (CLEOCIN) capsule 300 mg (not administered)     Initial Impression / Assessment and Plan / ED Course  I have reviewed the triage vital signs and the nursing notes.  Pertinent labs & imaging results that were available during my care of the patient were reviewed by me and considered in my medical decision making (see chart for details).     Pt also seen by Dr. Laverta Baltimore and care plan discussed.   [facility] dept contacted and request that pt come to dept to file report.    Bleeding controlled.  NV intact.  Rabies protocol initiated along with Td updated and prophylactic abx coverage.  Pt advised of risks of infection and agrees to ER return in 1-2 days for recheck.    Second rabies vaccine falls on weekend, so pt advised to return here for next injection on Saturday.    Final Clinical Impressions(s) / ED Diagnoses   Final diagnoses:  Cat bite, initial encounter    New Prescriptions Discharge Medication List as of 01/06/2017  4:34 PM    START taking these medications   Details  ciprofloxacin (CIPRO) 500 MG tablet Take 1 tablet (500 mg total) by mouth 2 (two) times daily., Starting Wed 01/06/2017, Print    clindamycin (CLEOCIN) 300 MG capsule Take 1 capsule (300 mg total) by mouth 3 (three) times daily., Starting Wed 01/06/2017, Print         [place], [place], PA-C 01/07/17 1404    Margette Fast, MD 01/07/17 1616

## 2017-01-06 NOTE — ED Notes (Signed)
ED Provider at bedside. 

## 2017-01-06 NOTE — ED Notes (Signed)
Family at bedside. 

## 2017-01-06 NOTE — Discharge Instructions (Signed)
Clean the wounds with mild soap and water and keep them bandaged.  Return here in 2 days for wound recheck and you will probably need to return here for the next vaccine since it falls on Saturday.  The specialty clinic here should contact you to arrange the remaining rabies vaccines.

## 2017-01-06 NOTE — ED Triage Notes (Signed)
Pt reports was bitten by a stray cat on RLE and right index finger approximately 1330. Moderate swelling noted to RLE.

## 2017-01-08 ENCOUNTER — Emergency Department (HOSPITAL_COMMUNITY)
Admission: EM | Admit: 2017-01-08 | Discharge: 2017-01-08 | Disposition: A | Discharge status: Home / Self Care | Payer: Medicare Other | Attending: Emergency Medicine | Admitting: Emergency Medicine

## 2017-01-08 ENCOUNTER — Encounter (HOSPITAL_COMMUNITY): Payer: Self-pay | Admitting: Cardiology

## 2017-01-08 DIAGNOSIS — W5501XA Bitten by cat, initial encounter: Secondary | ICD-10-CM | POA: Diagnosis not present

## 2017-01-08 DIAGNOSIS — Z79899 Other long term (current) drug therapy: Secondary | ICD-10-CM | POA: Insufficient documentation

## 2017-01-08 DIAGNOSIS — M7918 Myalgia, other site: Secondary | ICD-10-CM | POA: Insufficient documentation

## 2017-01-08 DIAGNOSIS — Z5189 Encounter for other specified aftercare: Secondary | ICD-10-CM

## 2017-01-08 DIAGNOSIS — Z91040 Latex allergy status: Secondary | ICD-10-CM | POA: Insufficient documentation

## 2017-01-08 DIAGNOSIS — Z85828 Personal history of other malignant neoplasm of skin: Secondary | ICD-10-CM | POA: Insufficient documentation

## 2017-01-08 DIAGNOSIS — Z87891 Personal history of nicotine dependence: Secondary | ICD-10-CM | POA: Insufficient documentation

## 2017-01-08 DIAGNOSIS — E039 Hypothyroidism, unspecified: Secondary | ICD-10-CM | POA: Insufficient documentation

## 2017-01-08 DIAGNOSIS — Z4800 Encounter for change or removal of nonsurgical wound dressing: Secondary | ICD-10-CM | POA: Insufficient documentation

## 2017-01-08 DIAGNOSIS — J45909 Unspecified asthma, uncomplicated: Secondary | ICD-10-CM | POA: Insufficient documentation

## 2017-01-08 NOTE — ED Triage Notes (Signed)
Pt reports has been seen for cat bite and told to return should it be worse Pt believes it is worse and would like to be rechecked

## 2017-01-08 NOTE — ED Provider Notes (Signed)
[facility] EMERGENCY DEPARTMENT Provider Note   CSN: [id number] Arrival date & time: 01/08/17  1207     History   Chief Complaint Chief Complaint  Patient presents with  . Wound Check    HPI Jenna Contreras is a 71 y.o. female.  HPI   Jenna Contreras is a 71yo female with a history of chronic back pain, asthma, hypothyroidism, hyperlipidemia, lung nodule presents the emergency department for wound check of her recent cat bite.  She was seen in the ER two days ago with bites to the right index finger and right lower leg.  These bites were sustained by a feral cat which she had been taking care of for several months.  At that time wounds were irrigated, rabies series was started, Tetanus updated and she was started on prophylactic antibiotics.  Today she states that she continues to have pain over her right index finger which is 7/10 in severity, constant and feels aching in nature.  States that her pain is worsened when she bends the index finger or moves index finger in any way.  She also states that she thinks that her right lower leg is more swollen than it was initially.  She denies numbness, weakness, fever, drainage, worsening erythema or rash.  States that she has been taking the antibiotic prescribed to her.  Is asking whether it will cause her stomach pain given she has a history of ulcers.  She is due for her next rabies vaccine tomorrow.  Past Medical History:  Diagnosis Date  . Anxiety   . Arthritis   . Arthritis   . Asthma   . Bipolar 2 disorder ([place])   . Cancer (HCC)    Basal Cell  . Constipation   . Depression   . Fibromyalgia   . GERD (gastroesophageal reflux disease)   . Heart murmur    As small child   . History of gout   . History of skin cancer   . HNP (herniated nucleus pulposus with myelopathy), thoracic   . Hypothyroidism   . Left eye injury    In ED 06/17/2014   . Low BP   . Migraines   . OCD (obsessive compulsive disorder)   .  Osteoporosis   . Psoriasis   . Scoliosis     Patient Active Problem List   Diagnosis Date Noted  . Abdominal pain, epigastric 09/18/2015  . FH: colon cancer 09/18/2015  . Osteoporosis 05/20/2015  . Moderate single current episode of major depressive disorder ([place]) 02/18/2015  . S/P right UKR 07/02/2014  . Status post unilateral knee replacement 07/02/2014  . Asthma, mild intermittent 02/06/2014  . Lung nodule 02/06/2014  . Hyperlipidemia 01/18/2014  . Expected blood loss anemia 05/10/2013  . Overweight (BMI 25.0-29.9) 05/10/2013  . S/P right THA, AA 05/09/2013  . Pre-operative respiratory examination 05/07/2013  . Post-nasal drainage 05/07/2013  . Endometrial polyp 02/28/2013  . Osteoarthritis of right hip 02/15/2013  . Precordial pain 12/07/2012  . Chronic back pain 08/17/2012  . Hypothyroid 08/17/2012  . Osteopenia 08/17/2012  . Hypersomnia 11/18/2011  . Asthma, mild persistent 08/05/2011  . Memory change 08/05/2011  . TIA (transient ischemic attack) 08/05/2011  . Pulmonary nodule 08/05/2011  . SOB (shortness of breath) 01/05/2011    Past Surgical History:  Procedure Laterality Date  . Arthroscopic left knee surgery    . DILATION AND CURETTAGE OF UTERUS  x3  . HIP SURGERY Right 2015  . iliac wings    . KNEE  SURGERY Bilateral 2015, 2016  . MANDIBLE SURGERY     X 2  . PARTIAL KNEE ARTHROPLASTY Left 02/12/2014   Procedure: LEFT KNEE UNICOMPARTMENTAL MEDIALLY ARTHROPLASTY ;  Surgeon: Mauri Pole, MD;  Location: [facility];  Service: Orthopedics;  Laterality: Left;  . PARTIAL KNEE ARTHROPLASTY Right 07/02/2014   Procedure: RIGHT UNI KNEE ARTHOPLASTY MEDIALLY;  Surgeon: Paralee Cancel, MD;  Location: [facility];  Service: Orthopedics;  Laterality: Right;  . rod in spine     scoliosis   . TONSILLECTOMY    . TOTAL HIP ARTHROPLASTY Right 05/09/2013   Procedure: RIGHT TOTAL HIP ARTHROPLASTY ANTERIOR APPROACH;  Surgeon: Mauri Pole, MD;  Location: [facility];  Service: Orthopedics;   Laterality: Right;  . TUBAL LIGATION    . WRIST SURGERY      OB History    No data available       Home Medications    Prior to Admission medications   Medication Sig Start Date End Date Taking? Authorizing Provider  albuterol (PROVENTIL HFA;VENTOLIN HFA) 108 (90 Base) MCG/ACT inhaler Inhale 2 puffs into the lungs every 4 (four) hours as needed for wheezing or shortness of breath. 02/26/16  Yes Brand Males, MD  Artificial Tear Ointment (REFRESH P.M. OP) Apply 1 application to eye at bedtime.   Yes [provider]  chlorpheniramine (CHLOR-TRIMETON) 4 MG tablet Take 4 mg by mouth 2 (two) times daily as needed for allergies. Reported on 05/20/2015   Yes [provider]  chlorzoxazone (PARAFON FORTE DSC) 500 MG tablet Take 1 tablet (500 mg total) by mouth 3 (three) times daily as needed for muscle spasms. 12/23/16  Yes Kathyrn Drown, MD  Cholecalciferol (VITAMIN D) 2000 UNITS CAPS Take 1 capsule by mouth 2 (two) times daily. Reported on 09/18/2015   Yes [provider]  ciprofloxacin (CIPRO) 500 MG tablet Take 1 tablet (500 mg total) by mouth 2 (two) times daily. 01/06/17  Yes Triplett, Tammy, PA-C  clindamycin (CLEOCIN) 300 MG capsule Take 1 capsule (300 mg total) by mouth 3 (three) times daily. 01/06/17  Yes Triplett, Tammy, PA-C  escitalopram (LEXAPRO) 10 MG tablet Take 1 tablet (10 mg total) by mouth daily. 08/20/16  Yes Luking, Elayne Snare, MD  fluticasone (FLONASE) 50 MCG/ACT nasal spray Place 1 spray into both nostrils 2 (two) times daily. Patient taking differently: Place 2 sprays into both nostrils 2 (two) times daily.  10/05/14  Yes Kathyrn Drown, MD  HYDROcodone-acetaminophen (NORCO) 10-325 MG tablet One tid prn Patient taking differently: Take 1 tablet by mouth 3 (three) times daily as needed for moderate pain or severe pain.  11/25/16  Yes Kathyrn Drown, MD  lamoTRIgine (LAMICTAL) 100 MG tablet Take 1 tablet (100 mg total) by mouth 2 (two) times  daily. 02/18/15  Yes Kathyrn Drown, MD  levothyroxine (SYNTHROID, LEVOTHROID) 88 MCG tablet Take 1 tablet (88 mcg total) by mouth daily before breakfast. 05/28/16  Yes Luking, Scott A, MD  LORazepam (ATIVAN) 0.5 MG tablet TAKE 1 TABLET BY MOUTH TWICE A DAY AS NEEDED FOR ANXIETY 08/20/16  Yes Luking, Scott A, MD  MAXALT 10 MG tablet TAKE 1 TABLET ONCE AS NEEDED FOR MIGRAINE. MAY REPEAT IN 2 HOURS IF NEEDED, NO GREATER THAN 4 TABLETS PER WEEK (MUST LAST 90 DAYS) 09/28/16  Yes Luking, Scott A, MD  ondansetron (ZOFRAN) 4 MG tablet Take 1 tablet (4 mg total) by mouth every 6 (six) hours. 01/06/17  Yes Triplett, Tammy, PA-C  pantoprazole (PROTONIX) 40  MG tablet Take 1 tablet (40 mg total) by mouth daily. 07/23/16  Yes Kathyrn Drown, MD    Family History Family History  Problem Relation Age of Onset  . Heart disease Mother   . Diabetes Mother   . Colon cancer Father 77  . Heart disease Brother     Social History Social History  Substance Use Topics  . Smoking status: Former Smoker    Packs/day: 2.00    Years: 19.00    Types: Cigarettes    Quit date: 03/10/1979  . Smokeless tobacco: Never Used     Comment: Quit in 1981  . Alcohol use No     Comment: no     Allergies   Prednisone; Adhesive [tape]; Betadine [povidone iodine]; Demerol [meperidine]; Latex; Levaquin [levofloxacin in d5w]; Silvadene [silver sulfadiazine]; Ampicillin; Cephalexin; Doxycycline; Erythromycin; Keflex [cephalexin]; Penicillins; and Tetracyclines & related   Review of Systems Review of Systems  Constitutional: Negative for chills, fatigue and fever.  Gastrointestinal: Negative for abdominal pain, nausea and vomiting.  Musculoskeletal: Positive for arthralgias (right index finger), joint swelling (right index finger) and myalgias (right lower leg pain. ). Negative for back pain, gait problem and neck pain.  Skin: Positive for wound. Negative for rash.  Psychiatric/Behavioral: Negative for agitation.      Physical Exam Updated Vital Signs BP (!) 104/55   Pulse 67   Temp 98.3 F (36.8 C) (Oral)   Resp 16   Ht 5' 3.5" (1.613 m)   Wt 56.7 kg (125 lb)   SpO2 95%   BMI 21.80 kg/m   Physical Exam  Constitutional: She is oriented to person, place, and time. She appears well-developed and well-nourished. No distress.  HENT:  Head: Normocephalic and atraumatic.  Eyes: Right eye exhibits no discharge. Left eye exhibits no discharge.  Pulmonary/Chest: Effort normal. No respiratory distress.  Musculoskeletal:  Mild tenderness to palpation over the proximal phalanx of the right index finger.  No tenderness with passive finger extension.  Right index finger is mildly swollen with several closed puncture wounds. Full ROM, although painful with flexion at the PIP joint.  Radial pulses 2+ bilaterally. Several closed puncture wounds noted on the right lower leg. Mild tenderness to palpation over wounds.  Neurological: She is alert and oriented to person, place, and time. Coordination normal.  Distal sensation to light touch intact in bilateral upper and lower extremities.  Skin: Skin is warm and dry. Capillary refill takes less than 2 seconds. She is not diaphoretic.  Several puncture wounds in the right mid index finger and right lower leg.  These wounds are closed.  No warmth, drainage, surrounding erythema or induration.   Psychiatric: She has a normal mood and affect. Her behavior is normal.  Nursing note and vitals reviewed.           ED Treatments / Results  Labs (all labs ordered are listed, but only abnormal results are displayed) Labs Reviewed - No data to display  EKG  EKG Interpretation None       Radiology Dg Finger Index Right  Result Date: 01/06/2017 CLINICAL DATA:  Recent CAT bite with pain and swelling EXAM: RIGHT INDEX FINGER 2+V COMPARISON:  None. FINDINGS: Degenerative changes of the interphalangeal joints are seen. Mild soft tissue swelling is noted  related to the recent injury. No subcutaneous air is seen. No acute fracture is seen. No foreign body is noted. IMPRESSION: Soft tissue swelling and degenerative change without acute bony abnormality. Electronically Signed   By:  Inez Catalina M.D.   On: 01/06/2017 16:25    Procedures Procedures (including critical care time)  Medications Ordered in ED Medications - No data to display   Initial Impression / Assessment and Plan / ED Course  I have reviewed the triage vital signs and the nursing notes.  Pertinent labs & imaging results that were available during my care of the patient were reviewed by me and considered in my medical decision making (see chart for details).     Wounds appear to be healing well.  No new erythema, induration, warmth, drainage or evidence of new infection.  Have counseled patient to continue washing the wounds with warm soapy water daily.  Have also discussed that she continue antibiotics.  She is to return to the ER tomorrow to get her second rabies vaccination.  We have discussed strict return precautions.  Patient appears reliable for follow-up and is agreeable to the plan.  This patient was discussed with and also seen by Dr. Laverta Baltimore who agrees with plan.  Final Clinical Impressions(s) / ED Diagnoses   Final diagnoses:  Visit for wound check    New Prescriptions New Prescriptions   No medications on file     Bernarda Caffey 01/09/17 [id number]    Long, Wonda Olds, MD 01/09/17 [phone]

## 2017-01-08 NOTE — Discharge Instructions (Signed)
Please return to the ER tomorrow for your second rabies vaccine.  You need a total of four vaccinations, one on day 3, another day 7 and another day 14 after the bite.   Please come early in the morning, around 7 AM.  You do not need to check in, just let them know that you need a rabies shot tomorrow at the nursing stand.   Please continue to take the antibiotics prescribed to you.  This is very important in preventing infection.  You can take yogurt or over-the-counter probiotics to help with diarrhea from antibiotics.  Return to the emergency department if you develop a fever greater than 100.4 F.  Please also return if there is significant redness over the wounds, drainage or pus coming from them.

## 2017-01-09 ENCOUNTER — Encounter (HOSPITAL_COMMUNITY): Admission: EM | Admit: 2017-01-09 | Payer: Medicare Other | Source: Ambulatory Visit | Admitting: Emergency Medicine

## 2017-01-09 ENCOUNTER — Encounter (HOSPITAL_COMMUNITY)
Admission: RE | Admit: 2017-01-09 | Discharge: 2017-01-09 | Disposition: A | Discharge status: Home / Self Care | Payer: Medicare Other | Source: Ambulatory Visit | Attending: Emergency Medicine | Admitting: Emergency Medicine

## 2017-01-09 DIAGNOSIS — S81859A Open bite, unspecified lower leg, initial encounter: Secondary | ICD-10-CM | POA: Insufficient documentation

## 2017-01-09 DIAGNOSIS — W5501XA Bitten by cat, initial encounter: Secondary | ICD-10-CM | POA: Insufficient documentation

## 2017-01-09 DIAGNOSIS — Z23 Encounter for immunization: Secondary | ICD-10-CM | POA: Diagnosis not present

## 2017-01-09 DIAGNOSIS — Z203 Contact with and (suspected) exposure to rabies: Secondary | ICD-10-CM | POA: Diagnosis not present

## 2017-01-09 MED ORDER — RABIES VACCINE, PCEC IM SUSR
1.0000 mL | Freq: Once | INTRAMUSCULAR | Status: AC
  Administered 2017-01-09: 1 mL via INTRAMUSCULAR
  Filled 2017-01-09: qty 1

## 2017-01-09 NOTE — ED Notes (Signed)
Allergies and History verified with patient. VSS. Rabavert dose verified with pharmacy and given in left deltoid. nad noted.

## 2017-01-13 ENCOUNTER — Encounter (HOSPITAL_COMMUNITY): Admission: EM | Admit: 2017-01-13 | Payer: Medicare Other | Source: Ambulatory Visit | Admitting: Emergency Medicine

## 2017-01-13 ENCOUNTER — Encounter (HOSPITAL_COMMUNITY)
Admission: RE | Admit: 2017-01-13 | Discharge: 2017-01-13 | Disposition: A | Discharge status: Home / Self Care | Payer: Medicare Other | Source: Ambulatory Visit | Attending: Emergency Medicine | Admitting: Emergency Medicine

## 2017-01-13 DIAGNOSIS — Z203 Contact with and (suspected) exposure to rabies: Secondary | ICD-10-CM | POA: Diagnosis not present

## 2017-01-13 DIAGNOSIS — Z23 Encounter for immunization: Secondary | ICD-10-CM | POA: Diagnosis not present

## 2017-01-13 DIAGNOSIS — S81859A Open bite, unspecified lower leg, initial encounter: Secondary | ICD-10-CM | POA: Diagnosis not present

## 2017-01-13 MED ORDER — RABIES VACCINE, PCEC IM SUSR
1.0000 mL | Freq: Once | INTRAMUSCULAR | Status: AC
  Administered 2017-01-13: 1 mL via INTRAMUSCULAR

## 2017-01-13 MED ORDER — RABIES VACCINE, PCEC IM SUSR
INTRAMUSCULAR | Status: AC
  Filled 2017-01-13: qty 1

## 2017-01-20 ENCOUNTER — Encounter (HOSPITAL_COMMUNITY): Admission: EM | Admit: 2017-01-20 | Payer: Medicare Other | Source: Ambulatory Visit | Admitting: Emergency Medicine

## 2017-01-20 ENCOUNTER — Encounter (HOSPITAL_COMMUNITY)
Admission: RE | Admit: 2017-01-20 | Discharge: 2017-01-20 | Disposition: A | Discharge status: Home / Self Care | Payer: Medicare Other | Source: Ambulatory Visit | Attending: Emergency Medicine | Admitting: Emergency Medicine

## 2017-01-20 DIAGNOSIS — Z23 Encounter for immunization: Secondary | ICD-10-CM | POA: Diagnosis not present

## 2017-01-20 DIAGNOSIS — Z203 Contact with and (suspected) exposure to rabies: Secondary | ICD-10-CM | POA: Diagnosis not present

## 2017-01-20 DIAGNOSIS — S81859A Open bite, unspecified lower leg, initial encounter: Secondary | ICD-10-CM | POA: Diagnosis not present

## 2017-01-20 MED ORDER — RABIES VACCINE, PCEC IM SUSR
INTRAMUSCULAR | Status: AC
  Filled 2017-01-20: qty 1

## 2017-01-20 MED ORDER — RABIES VACCINE, PCEC IM SUSR
1.0000 mL | Freq: Once | INTRAMUSCULAR | Status: AC
  Administered 2017-01-20: 1 mL via INTRAMUSCULAR

## 2017-02-02 DIAGNOSIS — F4310 Post-traumatic stress disorder, unspecified: Secondary | ICD-10-CM | POA: Diagnosis not present

## 2017-02-02 DIAGNOSIS — F319 Bipolar disorder, unspecified: Secondary | ICD-10-CM | POA: Diagnosis not present

## 2017-02-02 DIAGNOSIS — Z8659 Personal history of other mental and behavioral disorders: Secondary | ICD-10-CM | POA: Diagnosis not present

## 2017-02-02 DIAGNOSIS — F411 Generalized anxiety disorder: Secondary | ICD-10-CM | POA: Diagnosis not present

## 2017-02-03 ENCOUNTER — Other Ambulatory Visit: Payer: Self-pay

## 2017-02-03 ENCOUNTER — Emergency Department (HOSPITAL_COMMUNITY): Payer: Medicare Other

## 2017-02-03 ENCOUNTER — Emergency Department (HOSPITAL_COMMUNITY)
Admission: EM | Admit: 2017-02-03 | Discharge: 2017-02-03 | Disposition: A | Discharge status: Home / Self Care | Payer: Medicare Other | Attending: Emergency Medicine | Admitting: Emergency Medicine

## 2017-02-03 ENCOUNTER — Encounter (HOSPITAL_COMMUNITY): Payer: Self-pay | Admitting: *Deleted

## 2017-02-03 ENCOUNTER — Telehealth: Payer: Self-pay | Admitting: Family Medicine

## 2017-02-03 DIAGNOSIS — M549 Dorsalgia, unspecified: Secondary | ICD-10-CM | POA: Diagnosis present

## 2017-02-03 DIAGNOSIS — R1032 Left lower quadrant pain: Secondary | ICD-10-CM | POA: Insufficient documentation

## 2017-02-03 DIAGNOSIS — Z8719 Personal history of other diseases of the digestive system: Secondary | ICD-10-CM | POA: Diagnosis not present

## 2017-02-03 DIAGNOSIS — Z85820 Personal history of malignant melanoma of skin: Secondary | ICD-10-CM | POA: Insufficient documentation

## 2017-02-03 DIAGNOSIS — E039 Hypothyroidism, unspecified: Secondary | ICD-10-CM | POA: Insufficient documentation

## 2017-02-03 DIAGNOSIS — Z79899 Other long term (current) drug therapy: Secondary | ICD-10-CM | POA: Insufficient documentation

## 2017-02-03 DIAGNOSIS — B029 Zoster without complications: Secondary | ICD-10-CM | POA: Insufficient documentation

## 2017-02-03 DIAGNOSIS — G8929 Other chronic pain: Secondary | ICD-10-CM | POA: Insufficient documentation

## 2017-02-03 DIAGNOSIS — Z91040 Latex allergy status: Secondary | ICD-10-CM | POA: Insufficient documentation

## 2017-02-03 DIAGNOSIS — J45909 Unspecified asthma, uncomplicated: Secondary | ICD-10-CM | POA: Diagnosis not present

## 2017-02-03 DIAGNOSIS — I700 Atherosclerosis of aorta: Secondary | ICD-10-CM | POA: Diagnosis not present

## 2017-02-03 LAB — COMPREHENSIVE METABOLIC PANEL
ALBUMIN: 4.5 g/dL (ref 3.5–5.0)
ALT: 15 U/L (ref 14–54)
ANION GAP: 9 (ref 5–15)
AST: 18 U/L (ref 15–41)
Alkaline Phosphatase: 82 U/L (ref 38–126)
BILIRUBIN TOTAL: 0.6 mg/dL (ref 0.3–1.2)
BUN: 16 mg/dL (ref 6–20)
CALCIUM: 9.6 mg/dL (ref 8.9–10.3)
CO2: 27 mmol/L (ref 22–32)
Chloride: 102 mmol/L (ref 101–111)
Creatinine, Ser: 0.78 mg/dL (ref 0.44–1.00)
GFR calc Af Amer: >60 mL/min (ref >60)
GFR calc non Af Amer: >60 mL/min (ref >60)
GLUCOSE: 115 mg/dL — AB (ref 65–99)
POTASSIUM: 3.9 mmol/L (ref 3.5–5.1)
SODIUM: 138 mmol/L (ref 135–145)
Total Protein: 8.2 g/dL — ABNORMAL HIGH (ref 6.5–8.1)

## 2017-02-03 LAB — URINALYSIS, ROUTINE W REFLEX MICROSCOPIC
BACTERIA UA: NONE SEEN
BILIRUBIN URINE: NEGATIVE
Glucose, UA: NEGATIVE mg/dL
Hgb urine dipstick: NEGATIVE
KETONES UR: NEGATIVE mg/dL
Nitrite: NEGATIVE
Protein, ur: NEGATIVE mg/dL
Specific Gravity, Urine: 1.014 (ref 1.005–1.030)
pH: 5 (ref 5.0–8.0)

## 2017-02-03 LAB — CBC WITH DIFFERENTIAL/PLATELET
BASOS PCT: 1 %
Basophils Absolute: 0.1 10*3/uL (ref 0.0–0.1)
EOS ABS: 0.2 10*3/uL (ref 0.0–0.7)
EOS PCT: 2 %
HCT: 44.5 % (ref 36.0–46.0)
HEMOGLOBIN: 14 g/dL (ref 12.0–15.0)
LYMPHS ABS: 3.2 10*3/uL (ref 0.7–4.0)
Lymphocytes Relative: 33 %
MCH: 29.2 pg (ref 26.0–34.0)
MCHC: 31.5 g/dL (ref 30.0–36.0)
MCV: 92.7 fL (ref 78.0–100.0)
MONOS PCT: 6 %
Monocytes Absolute: 0.5 10*3/uL (ref 0.1–1.0)
NEUTROS PCT: 58 %
Neutro Abs: 5.5 10*3/uL (ref 1.7–7.7)
PLATELETS: 281 10*3/uL (ref 150–400)
RBC: 4.8 MIL/uL (ref 3.87–5.11)
RDW: 13.8 % (ref 11.5–15.5)
WBC: 9.5 10*3/uL (ref 4.0–10.5)

## 2017-02-03 MED ORDER — DIPHENHYDRAMINE HCL 50 MG/ML IJ SOLN
25.0000 mg | Freq: Once | INTRAMUSCULAR | Status: AC
  Administered 2017-02-03: 25 mg via INTRAVENOUS

## 2017-02-03 MED ORDER — ONDANSETRON HCL 4 MG/2ML IJ SOLN
4.0000 mg | Freq: Once | INTRAMUSCULAR | Status: AC
  Administered 2017-02-03: 4 mg via INTRAVENOUS
  Filled 2017-02-03: qty 2

## 2017-02-03 MED ORDER — DIPHENHYDRAMINE HCL 50 MG/ML IJ SOLN
INTRAMUSCULAR | Status: AC
  Administered 2017-02-03: 25 mg via INTRAVENOUS
  Filled 2017-02-03: qty 1

## 2017-02-03 MED ORDER — HYDROMORPHONE HCL 1 MG/ML IJ SOLN
1.0000 mg | Freq: Once | INTRAMUSCULAR | Status: AC
  Administered 2017-02-03: 1 mg via INTRAVENOUS
  Filled 2017-02-03: qty 1

## 2017-02-03 MED ORDER — PREDNISONE 20 MG PO TABS
40.0000 mg | ORAL_TABLET | Freq: Once | ORAL | Status: AC
  Administered 2017-02-03: 40 mg via ORAL
  Filled 2017-02-03: qty 2

## 2017-02-03 MED ORDER — VALACYCLOVIR HCL 500 MG PO TABS
1000.0000 mg | ORAL_TABLET | Freq: Once | ORAL | Status: AC
  Administered 2017-02-03: 1000 mg via ORAL
  Filled 2017-02-03: qty 2

## 2017-02-03 MED ORDER — HYDROMORPHONE HCL 2 MG PO TABS: 2.0000 mg | ORAL_TABLET | ORAL | 0 refills | Status: DC | PRN

## 2017-02-03 MED ORDER — VALACYCLOVIR HCL 1 G PO TABS: 1000.0000 mg | ORAL_TABLET | Freq: Three times a day (TID) | ORAL | 0 refills | Status: DC

## 2017-02-03 MED ORDER — PREDNISONE 20 MG PO TABS: ORAL_TABLET | ORAL | 0 refills | Status: DC

## 2017-02-03 MED ORDER — KETOROLAC TROMETHAMINE 30 MG/ML IJ SOLN
15.0000 mg | Freq: Once | INTRAMUSCULAR | Status: AC
  Administered 2017-02-03: 15 mg via INTRAVENOUS
  Filled 2017-02-03: qty 1

## 2017-02-03 NOTE — Telephone Encounter (Signed)
Pt was seen in the er last night and was diagnosed with Shingles. Pt was told that she would need to have her pcp prescribe her dilaudid. Pt states that the hospital gave her enough for two days. Please advise.

## 2017-02-03 NOTE — ED Provider Notes (Signed)
[facility] EMERGENCY DEPARTMENT Provider Note   CSN: [id number] Arrival date & time: 02/03/17  0134     History   Chief Complaint Chief Complaint  Patient presents with  . Back Pain    HPI Jenna Contreras is a 71 y.o. female.  The history is provided by the patient.  She has a history of chronic back pain status post rod placement.  She had onset about 11 PM on November 26 of severe left lumbar pain with some radiation around to the left lower abdomen.  She states pain feels like she was hit with a brick.  It hurts to press on that area.  She has been taking her hydrocodone-acetaminophen and getting absolutely no relief from it at all.  She denies any trauma.  She denies any dysuria.  She denies fever, chills, sweats.  She denies nausea or vomiting.  Past Medical History:  Diagnosis Date  . Anxiety   . Arthritis   . Arthritis   . Asthma   . Bipolar 2 disorder ([place])   . Cancer (HCC)    Basal Cell  . Constipation   . Depression   . Fibromyalgia   . GERD (gastroesophageal reflux disease)   . Heart murmur    As small child   . History of gout   . History of skin cancer   . HNP (herniated nucleus pulposus with myelopathy), thoracic   . Hypothyroidism   . Left eye injury    In ED 06/17/2014   . Low BP   . Migraines   . OCD (obsessive compulsive disorder)   . Osteoporosis   . Psoriasis   . Scoliosis     Patient Active Problem List   Diagnosis Date Noted  . Abdominal pain, epigastric 09/18/2015  . FH: colon cancer 09/18/2015  . Osteoporosis 05/20/2015  . Moderate single current episode of major depressive disorder ([place]) 02/18/2015  . S/P right UKR 07/02/2014  . Status post unilateral knee replacement 07/02/2014  . Asthma, mild intermittent 02/06/2014  . Lung nodule 02/06/2014  . Hyperlipidemia 01/18/2014  . Expected blood loss anemia 05/10/2013  . Overweight (BMI 25.0-29.9) 05/10/2013  . S/P right THA, AA 05/09/2013  . Pre-operative respiratory  examination 05/07/2013  . Post-nasal drainage 05/07/2013  . Endometrial polyp 02/28/2013  . Osteoarthritis of right hip 02/15/2013  . Precordial pain 12/07/2012  . Chronic back pain 08/17/2012  . Hypothyroid 08/17/2012  . Osteopenia 08/17/2012  . Hypersomnia 11/18/2011  . Asthma, mild persistent 08/05/2011  . Memory change 08/05/2011  . TIA (transient ischemic attack) 08/05/2011  . Pulmonary nodule 08/05/2011  . SOB (shortness of breath) 01/05/2011    Past Surgical History:  Procedure Laterality Date  . Arthroscopic left knee surgery    . DILATION AND CURETTAGE OF UTERUS  x3  . HIP SURGERY Right 2015  . iliac wings    . KNEE SURGERY Bilateral 2015, 2016  . MANDIBLE SURGERY     X 2  . PARTIAL KNEE ARTHROPLASTY Left 02/12/2014   Procedure: LEFT KNEE UNICOMPARTMENTAL MEDIALLY ARTHROPLASTY ;  Surgeon: Mauri Pole, MD;  Location: [facility];  Service: Orthopedics;  Laterality: Left;  . PARTIAL KNEE ARTHROPLASTY Right 07/02/2014   Procedure: RIGHT UNI KNEE ARTHOPLASTY MEDIALLY;  Surgeon: Paralee Cancel, MD;  Location: [facility];  Service: Orthopedics;  Laterality: Right;  . rod in spine     scoliosis   . TONSILLECTOMY    . TOTAL HIP ARTHROPLASTY Right 05/09/2013   Procedure: RIGHT TOTAL HIP ARTHROPLASTY  ANTERIOR APPROACH;  Surgeon: Mauri Pole, MD;  Location: [facility];  Service: Orthopedics;  Laterality: Right;  . TUBAL LIGATION    . WRIST SURGERY      OB History    No data available       Home Medications    Prior to Admission medications   Medication Sig Start Date End Date Taking? Authorizing Provider  albuterol (PROVENTIL HFA;VENTOLIN HFA) 108 (90 Base) MCG/ACT inhaler Inhale 2 puffs into the lungs every 4 (four) hours as needed for wheezing or shortness of breath. 02/26/16   Brand Males, MD  Artificial Tear Ointment (REFRESH P.M. OP) Apply 1 application to eye at bedtime.    [provider]  chlorpheniramine (CHLOR-TRIMETON) 4 MG tablet Take 4 mg by mouth 2  (two) times daily as needed for allergies. Reported on 05/20/2015    [provider]  chlorzoxazone (PARAFON FORTE DSC) 500 MG tablet Take 1 tablet (500 mg total) by mouth 3 (three) times daily as needed for muscle spasms. 12/23/16   Kathyrn Drown, MD  Cholecalciferol (VITAMIN D) 2000 UNITS CAPS Take 1 capsule by mouth 2 (two) times daily. Reported on 09/18/2015    [provider]  ciprofloxacin (CIPRO) 500 MG tablet Take 1 tablet (500 mg total) by mouth 2 (two) times daily. 01/06/17   Triplett, Tammy, PA-C  clindamycin (CLEOCIN) 300 MG capsule Take 1 capsule (300 mg total) by mouth 3 (three) times daily. 01/06/17   Triplett, Tammy, PA-C  escitalopram (LEXAPRO) 10 MG tablet Take 1 tablet (10 mg total) by mouth daily. 08/20/16   Kathyrn Drown, MD  fluticasone (FLONASE) 50 MCG/ACT nasal spray Place 1 spray into both nostrils 2 (two) times daily. Patient taking differently: Place 2 sprays into both nostrils 2 (two) times daily.  10/05/14   Kathyrn Drown, MD  HYDROcodone-acetaminophen (NORCO) 10-325 MG tablet One tid prn Patient taking differently: Take 1 tablet by mouth 3 (three) times daily as needed for moderate pain or severe pain.  11/25/16   Kathyrn Drown, MD  lamoTRIgine (LAMICTAL) 100 MG tablet Take 1 tablet (100 mg total) by mouth 2 (two) times daily. 02/18/15   Kathyrn Drown, MD  levothyroxine (SYNTHROID, LEVOTHROID) 88 MCG tablet Take 1 tablet (88 mcg total) by mouth daily before breakfast. 05/28/16   Luking, Elayne Snare, MD  LORazepam (ATIVAN) 0.5 MG tablet TAKE 1 TABLET BY MOUTH TWICE A DAY AS NEEDED FOR ANXIETY 08/20/16   Luking, Scott A, MD  MAXALT 10 MG tablet TAKE 1 TABLET ONCE AS NEEDED FOR MIGRAINE. MAY REPEAT IN 2 HOURS IF NEEDED, NO GREATER THAN 4 TABLETS PER WEEK (MUST LAST 90 DAYS) 09/28/16   Luking, Elayne Snare, MD  ondansetron (ZOFRAN) 4 MG tablet Take 1 tablet (4 mg total) by mouth every 6 (six) hours. 01/06/17   Triplett, Tammy, PA-C  pantoprazole (PROTONIX) 40 MG  tablet Take 1 tablet (40 mg total) by mouth daily. 07/23/16   Kathyrn Drown, MD    Family History Family History  Problem Relation Age of Onset  . Heart disease Mother   . Diabetes Mother   . Colon cancer Father 28  . Heart disease Brother     Social History Social History   Tobacco Use  . Smoking status: Former Smoker    Packs/day: 2.00    Years: 19.00    Pack years: 38.00    Types: Cigarettes    Last attempt to quit: 03/10/1979    Years since quitting:  37.9  . Smokeless tobacco: Never Used  . Tobacco comment: Quit in 1981  Substance Use Topics  . Alcohol use: No    Alcohol/week: 0.0 oz    Comment: no  . Drug use: No     Allergies   Prednisone; Adhesive [tape]; Betadine [povidone iodine]; Demerol [meperidine]; Latex; Levaquin [levofloxacin in d5w]; Silvadene [silver sulfadiazine]; Ampicillin; Cephalexin; Doxycycline; Erythromycin; Keflex [cephalexin]; Penicillins; and Tetracyclines & related   Review of Systems Review of Systems  All other systems reviewed and are negative.    Physical Exam Updated Vital Signs BP 121/65 (BP Location: Left Arm)   Pulse 62   Temp 97.8 F (36.6 C) (Oral)   Resp 18   Ht 5' 3.5" (1.613 m)   Wt 56.7 kg (125 lb)   SpO2 99%   BMI 21.80 kg/m   Physical Exam  Nursing note and vitals reviewed.  71 year old female, appears uncomfortable, but is in no acute distress. Vital signs are normal. Oxygen saturation is 99%, which is normal. Head is normocephalic and atraumatic. PERRLA, EOMI. Oropharynx is clear. Neck is nontender and supple without adenopathy or JVD. Back is nontender in the midline.  There is generalized tenderness in the left paralumbar area.  There is a very faint erythematous rash over the same area. Lungs are clear without rales, wheezes, or rhonchi. Chest is nontender. Heart has regular rate and rhythm without murmur. Abdomen is soft, flat, nontender without masses or hepatosplenomegaly and peristalsis is  normoactive. Extremities have no cyanosis or edema, full range of motion is present. Skin is warm and dry without other rash. Neurologic: Mental status is normal, cranial nerves are intact, there are no motor or sensory deficits.  ED Treatments / Results  Labs (all labs ordered are listed, but only abnormal results are displayed) Labs Reviewed  URINALYSIS, ROUTINE W REFLEX MICROSCOPIC - Abnormal; Notable for the following components:      Result Value   Color, Urine STRAW (*)    Leukocytes, UA SMALL (*)    Squamous Epithelial / LPF 0-5 (*)    All other components within normal limits  COMPREHENSIVE METABOLIC PANEL - Abnormal; Notable for the following components:   Glucose, Bld 115 (*)    Total Protein 8.2 (*)    All other components within normal limits  CBC WITH DIFFERENTIAL/PLATELET   Radiology Ct Renal Stone Study  Result Date: 02/03/2017 CLINICAL DATA:  Severe back pain for 2 days without injury. EXAM: CT ABDOMEN AND PELVIS WITHOUT CONTRAST TECHNIQUE: Multidetector CT imaging of the abdomen and pelvis was performed following the standard protocol without IV contrast. COMPARISON:  Lumbar spine radiographs March 25, 2015 and CT abdomen and pelvis November 26, 2013 FINDINGS: LOWER CHEST: Lung bases are clear. The visualized heart size is normal. No pericardial effusion. RIGHT breast implant. HEPATOBILIARY: Stable subcentimeter RIGHT lobe of the liver. Liver is otherwise unremarkable. Tiny layering gallstones without CT findings of acute cholecystitis. PANCREAS: Normal. SPLEEN: Normal. ADRENALS/URINARY TRACT: Kidneys are orthotopic, demonstrating normal size and morphology. No nephrolithiasis, hydronephrosis; limited assessment for renal masses on this nonenhanced examination. 14 mm cyst RIGHT interpolar kidney, unchanged. The unopacified ureters are normal in course and caliber. Urinary bladder is decompressed and unremarkable. Normal adrenal glands. STOMACH/BOWEL: The stomach, small  and large bowel are normal in course and caliber without inflammatory changes, sensitivity decreased by lack of enteric contrast. Mild colonic diverticulosis. A few scattered small bowel diverticula noted. Moderate amount of retained large bowel stool. Small amount of small bowel  feces compatible chronic stasis. Normal appendix. VASCULAR/LYMPHATIC: Aortoiliac vessels are normal in course and caliber. Mild calcific atherosclerosis. No lymphadenopathy by CT size criteria. REPRODUCTIVE: Normal. OTHER: No intraperitoneal free fluid or free air. Small fat containing umbilical hernia. MUSCULOSKELETAL: Non-acute. Status post RIGHT hip total arthroplasty. Thoracolumbar and sacral hardware. Included hardware is intact, no periprosthetic lucency. Incorporated posterior bone graft material. Status post L2-3 through L5-S1 laminectomies. IMPRESSION: 1. No acute intra-abdominal or pelvic process. 2. Status post thoracosacral fusion, lumbar laminectomies. No hardware failure. 3. Moderate amount of retained large bowel stool without bowel obstruction. Aortic Atherosclerosis (ICD10-I70.0). Electronically Signed   By: Elon Alas M.D.   On: 02/03/2017 04:03    Procedures Procedures (including critical care time)  Medications Ordered in ED Medications  valACYclovir (VALTREX) tablet 1,000 mg (not administered)  predniSONE (DELTASONE) tablet 40 mg (not administered)  ketorolac (TORADOL) 30 MG/ML injection 15 mg (15 mg Intravenous Given 02/03/17 0316)  ondansetron (ZOFRAN) injection 4 mg (4 mg Intravenous Given 02/03/17 0316)  HYDROmorphone (DILAUDID) injection 1 mg (1 mg Intravenous Given 02/03/17 0318)  diphenhydrAMINE (BENADRYL) injection 25 mg (25 mg Intravenous Given 02/03/17 0353)     Initial Impression / Assessment and Plan / ED Course  I have reviewed the triage vital signs and the nursing notes.  Pertinent labs & imaging results that were available during my care of the patient were reviewed by me and  considered in my medical decision making (see chart for details).  Left lumbar pain with very faint rash present worrisome for herpes zoster.  Will get urinalysis and check CT renal stone protocol to rule out urinary tract infection and urolithiasis.  Doubt abdominal aneurysm.  In the meantime, she is also given ketorolac and hydromorphone for pain.  Old records are reviewed confirming chronic back pain treated with narcotics.   She got good relief of pain with above-noted treatment.  Urinalysis shows no hematuria and no evidence of infection.  CT scan shows no evidence of urolithiasis and no acute process to account for pain.  She will be treated for herpes zoster.  Of note, patient lists an intolerance for prednisone stating that she had suicidal thoughts when she took it before.  I have discussed with her the potential benefit of prednisone reducing risk of postherpetic neuralgia.  Patient states that she feels that the benefit is worth the risk of depression as a side effect.  She is discharged with prescriptions for valacyclovir, prednisone, hydromorphone.  She is advised to stop prednisone if she has any mood changes.  She is to follow-up with her primary care provider who is also her pain management physician.  Advised that he will need to provide her narcotic prescriptions going forward.  Final Clinical Impressions(s) / ED Diagnoses   Final diagnoses:  Herpes zoster without complication    ED Discharge Orders        Ordered    valACYclovir (VALTREX) 1000 MG tablet  3 times daily     02/03/17 0429    HYDROmorphone (DILAUDID) 2 MG tablet  Every 4 hours PRN     75/10/25 8527       Delora Fuel, MD 78/24/23 [phone]

## 2017-02-03 NOTE — ED Triage Notes (Signed)
Pt c/o severe back pain x 2 days; pt denies any injury

## 2017-02-03 NOTE — Telephone Encounter (Signed)
Discussed with pt. Pt verbalized understanding. Transferred to the front to schedule office visit

## 2017-02-03 NOTE — Telephone Encounter (Signed)
This would require an office visit this is not something I can just call in or right without talking with the patient verifying and following state protocols regarding this controlled medicine she can have an office visit for tomorrow-she should bring any of her narcotic medications with her

## 2017-02-04 ENCOUNTER — Encounter: Payer: Self-pay | Admitting: Family Medicine

## 2017-02-04 ENCOUNTER — Ambulatory Visit (INDEPENDENT_AMBULATORY_CARE_PROVIDER_SITE_OTHER): Payer: Medicare Other | Admitting: Family Medicine

## 2017-02-04 VITALS — BP 128/82 | Ht 63.0 in | Wt 128.0 lb

## 2017-02-04 DIAGNOSIS — B029 Zoster without complications: Secondary | ICD-10-CM | POA: Diagnosis not present

## 2017-02-04 MED ORDER — ONDANSETRON HCL 4 MG PO TABS: 4.0000 mg | ORAL_TABLET | Freq: Four times a day (QID) | ORAL | 6 refills | Status: DC

## 2017-02-04 MED ORDER — HYDROMORPHONE HCL 2 MG PO TABS: 2.0000 mg | ORAL_TABLET | ORAL | 0 refills | Status: DC | PRN

## 2017-02-04 NOTE — Patient Instructions (Signed)
No muscle relaxer  Minimize or avoid lorazepam while on hydromorph

## 2017-02-04 NOTE — Progress Notes (Addendum)
   Subjective:    Patient ID: Jenna Contreras, female    DOB: [birth year], 71 y.o.   MRN: [id number]  HPI  Patient arrives for a follow up from ER for shingles. Patient stated the ER only gave her 12 pills of Dilaudid yesterday and she  Would need to follow up with PCP for more medication. This patient states that hydrocodone did not touch her pain her pain was so severe she was crying with it she took the Dilaudid it did help it does make her a little drowsy but she denies it causing her to be somnolent she denies any chest tightness pressure pain or shortness of breath she is not using a muscle relaxant and she is rarely using according to her Review of Systems    Please see above negative for fever chills cough wheezing vomiting diarrhea Objective:   Physical Exam Lungs clear heart regular patient alert shingles noted on her back into the left hip region  Patient was advised not to drive while on medicine The patient was cautioned that any use of muscle relaxants or nerve medicine medically increases her risk of accidental overdose and death    Assessment & Plan:  Shingles-Valtrex until 1 week is finished Dilaudid tablets new prescription given 1 every 4 hours as needed severe pain cautioned drowsiness No muscle relaxants Avoid benzodiazepines Next week if pain is doing better I recommend shifting to oxycodone and eventually getting away from pain medicine

## 2017-02-05 ENCOUNTER — Other Ambulatory Visit: Payer: Self-pay | Admitting: *Deleted

## 2017-02-05 ENCOUNTER — Telehealth: Payer: Self-pay | Admitting: Family Medicine

## 2017-02-05 MED ORDER — PREDNISONE 20 MG PO TABS: ORAL_TABLET | ORAL | 0 refills | Status: DC

## 2017-02-05 NOTE — Telephone Encounter (Signed)
Discussed with pt. Pt verbalized understanding. Prednisone sent to pharm.

## 2017-02-05 NOTE — Telephone Encounter (Signed)
Seen yesterday by dr Nicki Reaper for ED follow up on shingles. Pt states the dilaudid is not touching the pain. Pt states she is in extreme pain. She did not think the pain could get worse but it has. Taking dilaudid every 4 hours.

## 2017-02-05 NOTE — Telephone Encounter (Signed)
Patient is requesting Jenna Contreras to give her a call back.

## 2017-02-05 NOTE — Telephone Encounter (Signed)
That is a very strong oral pain med and let pt know dr Nicki Reaper has prescribed the maximum oral dose allowed for this very strong med, pt can bump u her steroid amnt which migh help a bit stop current dose and give adult pred taper

## 2017-02-08 ENCOUNTER — Telehealth: Payer: Self-pay | Admitting: Family Medicine

## 2017-02-08 NOTE — Telephone Encounter (Signed)
I tried calling no answer. I left a vm asked that she r/c.

## 2017-02-08 NOTE — Telephone Encounter (Signed)
Pt is requesting a call from the nurse. Pt was told to call in with a status on how she is doing.

## 2017-02-09 ENCOUNTER — Other Ambulatory Visit: Payer: Self-pay | Admitting: *Deleted

## 2017-02-09 MED ORDER — HYDROMORPHONE HCL 2 MG PO TABS: 2.0000 mg | ORAL_TABLET | ORAL | 0 refills | Status: DC | PRN

## 2017-02-09 NOTE — Telephone Encounter (Signed)
The drug registry was checked prescription for pain medicine was written and given to her husband per patient request this is for pain associated with her shingles

## 2017-02-09 NOTE — Telephone Encounter (Addendum)
Patient stated her pain is no better and the dilaudid is not hardly touching the pain and she needs a new script given to her husband at his visit today- last got #30 02/04/17

## 2017-02-11 ENCOUNTER — Other Ambulatory Visit: Payer: Self-pay

## 2017-02-17 ENCOUNTER — Telehealth: Payer: Self-pay | Admitting: Family Medicine

## 2017-02-17 ENCOUNTER — Other Ambulatory Visit: Payer: Self-pay | Admitting: Family Medicine

## 2017-02-17 ENCOUNTER — Other Ambulatory Visit: Payer: Self-pay | Admitting: *Deleted

## 2017-02-17 MED ORDER — HYDROMORPHONE HCL 2 MG PO TABS: 2.0000 mg | ORAL_TABLET | ORAL | 0 refills | Status: DC | PRN

## 2017-02-17 NOTE — Telephone Encounter (Signed)
Script given to pt today while at office visit with her husband.

## 2017-02-17 NOTE — Telephone Encounter (Signed)
Shingles pain is not any better. Taking dilaudid every 4 hours. If she sleeps through the four hours it takes the next 3 doses to catch up and ease the pain.

## 2017-02-17 NOTE — Telephone Encounter (Signed)
Pt is requesting a refill on HYDROmorphone (DILAUDID) 2 MG tablet Pt states that the pain is not any better.

## 2017-02-17 NOTE — Telephone Encounter (Signed)
May fill #48 one q 4 hours prn pain, drug regiswas chjecked

## 2017-02-18 ENCOUNTER — Telehealth: Payer: Self-pay | Admitting: *Deleted

## 2017-02-18 NOTE — Telephone Encounter (Signed)
agreed

## 2017-02-18 NOTE — Telephone Encounter (Signed)
Husband called and reported that pt is having severe abdominal cramping.taking dilaudid every 4 hours. Not helping with the pain. Advised to take her to urgent care or ED. He agreed.

## 2017-02-24 ENCOUNTER — Telehealth: Payer: Self-pay | Admitting: Family Medicine

## 2017-02-24 MED ORDER — OXYCODONE-ACETAMINOPHEN 5-325 MG PO TABS: 1.0000 | ORAL_TABLET | ORAL | 0 refills | Status: DC | PRN

## 2017-02-24 NOTE — Telephone Encounter (Signed)
Patient states she 71 out of the 70 given 02/17/17 and will continue till they are gone and would like to have the percocet on hand to start on Friday when she is out of the dilaudid

## 2017-02-24 NOTE — Telephone Encounter (Signed)
She may have this prescription filled on Friday

## 2017-02-24 NOTE — Telephone Encounter (Signed)
Please find out from the patient how much Dilaudid does she have left?

## 2017-02-24 NOTE — Telephone Encounter (Signed)
The patient will need to do a pain medicine follow-up in January

## 2017-02-24 NOTE — Telephone Encounter (Signed)
I recommend Percocet 5 mg/325 1 every 4 hours as needed severe pain cautioned drowsiness #84

## 2017-02-24 NOTE — Telephone Encounter (Signed)
Patient wants to let Dr. Nicki Reaper know that she is ready to drop down to Percocet.

## 2017-02-25 ENCOUNTER — Ambulatory Visit (HOSPITAL_COMMUNITY)
Admission: RE | Admit: 2017-02-25 | Discharge: 2017-02-25 | Disposition: A | Discharge status: Home / Self Care | Payer: Medicare Other | Source: Ambulatory Visit | Attending: Internal Medicine | Admitting: Internal Medicine

## 2017-02-25 DIAGNOSIS — R918 Other nonspecific abnormal finding of lung field: Secondary | ICD-10-CM | POA: Diagnosis not present

## 2017-02-25 DIAGNOSIS — R911 Solitary pulmonary nodule: Secondary | ICD-10-CM

## 2017-02-25 DIAGNOSIS — I700 Atherosclerosis of aorta: Secondary | ICD-10-CM | POA: Diagnosis not present

## 2017-02-25 NOTE — Telephone Encounter (Signed)
Prescription upfront for pick up. Patient notified. 

## 2017-02-26 ENCOUNTER — Ambulatory Visit: Payer: Medicare Other | Admitting: Internal Medicine

## 2017-02-26 NOTE — Progress Notes (Signed)
lmtcb for pt x 2.  °

## 2017-03-17 ENCOUNTER — Ambulatory Visit (INDEPENDENT_AMBULATORY_CARE_PROVIDER_SITE_OTHER): Payer: Medicare Other | Admitting: Internal Medicine

## 2017-03-17 ENCOUNTER — Encounter: Payer: Self-pay | Admitting: Internal Medicine

## 2017-03-17 VITALS — BP 100/64 | HR 82 | Ht 62.5 in | Wt 129.0 lb

## 2017-03-17 DIAGNOSIS — J4530 Mild persistent asthma, uncomplicated: Secondary | ICD-10-CM | POA: Diagnosis not present

## 2017-03-17 DIAGNOSIS — R911 Solitary pulmonary nodule: Secondary | ICD-10-CM

## 2017-03-17 NOTE — Patient Instructions (Addendum)
ICD-9-CM ICD-10-CM   1. Asthma, mild intermittent, uncomplicated 737.10 G26.94   2. Lung nodule 793.11 R91.1     #asthma - well controlled - use albuterol as needed  #Lung nodule  -stable and no further followup   Followup - 12 months or sooner iif needed

## 2017-03-17 NOTE — Progress Notes (Signed)
Subjective:     Patient ID: Jenna Contreras, female   DOB: Jul 20, 1945, 72 y.o.   MRN: [id number]  HPI    OV 11/22/2015  Chief Complaint  Patient presents with  . Follow-up    Pt states she feels her breathing has worsened since last OV in 10/2014. Pt c/o increase in SOB with activity, non prod cough with chest congestion and occasional chest tightness. . Pt states she has had an increase in "asthma attacks".      Jenna Contreras 72 y.o. female - Last seen August 2016 for mild intermittent asthma. We got her off inhale steroids. She only has 4 mm pulmonary nodules last seen December 2015 and was stable since 2012. In the interim she's had major spinal surgery for scoliosis and had a rough postoperative course. She still dealing with significant myofascial spasm because of surgical incisions. At this point in time for the last few months her cough has returned. She feels her asthma is active. In addition she is asking me to repeat CT chest 4 mm nodule which are deemed stable for over 3 years. She is extremely anxious about the nodules being cancer. In fact she tells me that her daughter is asked me to biopsy this 4 mm nodules. She is very keen on getting the CT chest.  OV 02/26/2016  Chief Complaint  Patient presents with  . Follow-up    Pt states she is not on the Arnuity because she did not receive it from her pharmacy. Pt states when she was taking Arnuity she noticed a significant imporvement. Pt states she had a bad "asthma attack" on 12.19.17. Pt denies f/c/s.    Jenna Contreras returns for follow-up of asthma and lung nodules   Last visit started on inhaled fluticasone ARNUITY she says it made a big difference for her. She says it has the biggest impact in her life in terms of her breathing. However [facility] did not fill this medication and she is indicated. She is now run out of the medication. Last night she had an asthma flare but is back to baseline. She  says she almost went to the emergency room.    In terms of lung nodules:CT chest October 2017: Old 20mm nodule stable since 2015. Now there are new x 2 x 26mm nodules in lingula and LLL. This is NOT cancer but to be 100% sure please order repeat CT chest wo contrast in 1 year. Please let Jenna Contreras know   Past medical history: She continues to deal with back pain even after back surgery. She inquired about alternative forms of treatment suggest chiropractor, dry needling, acupuncture. I gave her some names locally.     OV 03/17/2017  Chief Complaint  Patient presents with  . Follow-up    f/u she is doing well, no concerns   Follow-up mild intermittent asthma: Last time I gave her a nasal steroid but she is not taking it.  In the last 1 year she has not had any exacerbations.  Asthma control questionnaire is 0 out of 5.  She is not waking up at night.  When she wakes up she does not have any symptoms she is not using albuterol for rescue.  She has no limitations in her activities.  The main issue is chronic back pain.   Follow-up lung nodules: She is noted to have stable multiple tiny pulmonary nodules.  However she keeps insisting she wants serial CT chest.  She had one in December 2018  and the results are below.  The continue to be stable.  IMPRESSION: CT chest dec 2018 1. Stable tiny right lung nodules consistent with benign findings. No additional specific follow-up indicated. This recommendation follows the consensus statement: Guidelines for Management of Small Pulmonary Nodules Detected on CT Images: From the Fleischner Society 2017; Radiology 2017; 284:228-243. 2. No acute chest findings. 3. Stable mild Aortic Atherosclerosis (ICD10-I70.0).   Electronically Signed   By: Richardean Sale M.D.   On: 02/25/2017 13:43   has a past medical history of Anxiety, Arthritis, Arthritis, Asthma, Bipolar 2 disorder ([place]), Cancer ([place]), Constipation, Depression, Fibromyalgia, GERD  (gastroesophageal reflux disease), Heart murmur, History of gout, History of skin cancer, HNP (herniated nucleus pulposus with myelopathy), thoracic, Hypothyroidism, Left eye injury, Low BP, Migraines, OCD (obsessive compulsive disorder), Osteoporosis, Psoriasis, and Scoliosis.   reports that she quit smoking about 38 years ago. Her smoking use included cigarettes. She has a 38.00 pack-year smoking history. she has never used smokeless tobacco.  Past Surgical History:  Procedure Laterality Date  . Arthroscopic left knee surgery    . DILATION AND CURETTAGE OF UTERUS  x3  . HIP SURGERY Right 2015  . iliac wings    . KNEE SURGERY Bilateral 2015, 2016  . MANDIBLE SURGERY     X 2  . PARTIAL KNEE ARTHROPLASTY Left 02/12/2014   Procedure: LEFT KNEE UNICOMPARTMENTAL MEDIALLY ARTHROPLASTY ;  Surgeon: Mauri Pole, MD;  Location: [facility];  Service: Orthopedics;  Laterality: Left;  . PARTIAL KNEE ARTHROPLASTY Right 07/02/2014   Procedure: RIGHT UNI KNEE ARTHOPLASTY MEDIALLY;  Surgeon: Paralee Cancel, MD;  Location: [facility];  Service: Orthopedics;  Laterality: Right;  . rod in spine     scoliosis   . TONSILLECTOMY    . TOTAL HIP ARTHROPLASTY Right 05/09/2013   Procedure: RIGHT TOTAL HIP ARTHROPLASTY ANTERIOR APPROACH;  Surgeon: Mauri Pole, MD;  Location: [facility];  Service: Orthopedics;  Laterality: Right;  . TUBAL LIGATION    . WRIST SURGERY      Allergies  Allergen Reactions  . Prednisone Other (See Comments)    Suicidal ideation  . Adhesive [Tape]     Pulls her skin off  . Betadine [Povidone Iodine] Other (See Comments)    Sets her on fire  . Demerol [Meperidine] Nausea And Vomiting  . Latex Other (See Comments)    Irritates her skin  . Levaquin [Levofloxacin In D5w]     Painful joints   . Silvadene [Silver Sulfadiazine]   . Ampicillin Rash  . Cephalexin Itching and Rash  . Doxycycline Itching and Rash  . Erythromycin Rash  . Keflex [Cephalexin] Rash  . Penicillins Itching and Rash     Has patient had a PCN reaction causing immediate rash, facial/tongue/throat swelling, SOB or lightheadedness with hypotension: no Has patient had a PCN reaction causing severe rash involving mucus membranes or skin necrosis: no Has patient had a PCN reaction that required hospitalization: no Has patient had a PCN reaction occurring within the last 10 years: no If all of the above answers are "NO", then may proceed with Cephalosporin use.   . Tetracyclines & Related Itching and Rash    Immunization History  Administered Date(s) Administered  . Influenza Split 03/13/2011, 11/18/2011, 02/24/2013  . Influenza,inj,Quad PF,6+ Mos 01/18/2014, 02/18/2015, 11/22/2015, 11/25/2016  . Influenza-Unspecified 12/08/2011  . Pneumococcal Conjugate-13 01/18/2014  . Pneumococcal Polysaccharide-23 03/13/2011  . Rabies, IM 01/06/2017, 01/09/2017, 01/13/2017, 01/20/2017  . Tdap 01/06/2017  . Zoster 01/28/2012  Family History  Problem Relation Age of Onset  . Heart disease Mother   . Diabetes Mother   . Colon cancer Father 18  . Heart disease Brother      Current Outpatient Medications:  .  albuterol (PROVENTIL HFA;VENTOLIN HFA) 108 (90 Base) MCG/ACT inhaler, Inhale 2 puffs into the lungs every 4 (four) hours as needed for wheezing or shortness of breath., Disp: 3 Inhaler, Rfl: 3 .  chlorpheniramine (CHLOR-TRIMETON) 4 MG tablet, Take 4 mg by mouth 2 (two) times daily as needed for allergies. Reported on 05/20/2015, Disp: , Rfl:  .  chlorzoxazone (PARAFON FORTE DSC) 500 MG tablet, Take 1 tablet (500 mg total) by mouth 3 (three) times daily as needed for muscle spasms., Disp: 90 tablet, Rfl: 2 .  Cholecalciferol (VITAMIN D) 2000 UNITS CAPS, Take 1 capsule by mouth 2 (two) times daily. Reported on 09/18/2015, Disp: , Rfl:  .  ciprofloxacin (CIPRO) 500 MG tablet, Take 1 tablet (500 mg total) by mouth 2 (two) times daily., Disp: 14 tablet, Rfl: 0 .  escitalopram (LEXAPRO) 10 MG tablet, Take 1 tablet (10 mg  total) by mouth daily., Disp: 30 tablet, Rfl: 2 .  fluticasone (FLONASE) 50 MCG/ACT nasal spray, Place 1 spray into both nostrils 2 (two) times daily. (Patient taking differently: Place 2 sprays into both nostrils 2 (two) times daily. ), Disp: 16 g, Rfl: 1 .  HYDROcodone-acetaminophen (NORCO) 10-325 MG tablet, One tid prn (Patient taking differently: Take 1 tablet by mouth 3 (three) times daily as needed for moderate pain or severe pain. ), Disp: 90 tablet, Rfl: 0 .  HYDROmorphone (DILAUDID) 2 MG tablet, Take 1 tablet (2 mg total) by mouth every 4 (four) hours as needed for severe pain., Disp: 48 tablet, Rfl: 0 .  lamoTRIgine (LAMICTAL) 100 MG tablet, Take 1 tablet (100 mg total) by mouth 2 (two) times daily., Disp: 180 tablet, Rfl: 2 .  levothyroxine (SYNTHROID, LEVOTHROID) 88 MCG tablet, Take 1 tablet (88 mcg total) by mouth daily before breakfast., Disp: 90 tablet, Rfl: 1 .  LORazepam (ATIVAN) 0.5 MG tablet, TAKE 1 TABLET BY MOUTH TWICE A DAY AS NEEDED FOR ANXIETY, Disp: 35 tablet, Rfl: 1 .  MAXALT 10 MG tablet, TAKE 1 TABLET ONCE AS NEEDED FOR MIGRAINE. MAY REPEAT IN 2 HOURS IF NEEDED, NO GREATER THAN 4 TABLETS PER WEEK (MUST LAST 90 DAYS), Disp: 48 tablet, Rfl: 3 .  ondansetron (ZOFRAN) 4 MG tablet, Take 1 tablet (4 mg total) by mouth every 6 (six) hours., Disp: 20 tablet, Rfl: 6 .  oxyCODONE-acetaminophen (ROXICET) 5-325 MG tablet, Take 1 tablet by mouth every 4 (four) hours as needed for severe pain., Disp: 84 tablet, Rfl: 0 .  pantoprazole (PROTONIX) 40 MG tablet, Take 1 tablet (40 mg total) by mouth daily., Disp: 30 tablet, Rfl: 2  Review of Systems     Objective:   Physical Exam  Constitutional: She is oriented to person, place, and time. She appears well-developed and well-nourished. No distress.  HENT:  Head: Normocephalic and atraumatic.  Right Ear: External ear normal.  Left Ear: External ear normal.  Mouth/Throat: Oropharynx is clear and moist. No oropharyngeal exudate.  Eyes:  Conjunctivae and EOM are normal. Pupils are equal, round, and reactive to light. Right eye exhibits no discharge. Left eye exhibits no discharge. No scleral icterus.  Neck: Normal range of motion. Neck supple. No JVD present. No tracheal deviation present. No thyromegaly present.  Cardiovascular: Normal rate, regular rhythm, normal heart sounds and intact  distal pulses. Exam reveals no gallop and no friction rub.  No murmur heard. Pulmonary/Chest: Effort normal and breath sounds normal. No respiratory distress. She has no wheezes. She has no rales. She exhibits no tenderness.  Abdominal: Soft. Bowel sounds are normal. She exhibits no distension and no mass. There is no tenderness. There is no rebound and no guarding.  Musculoskeletal: Normal range of motion. She exhibits no edema or tenderness.  Lymphadenopathy:    She has no cervical adenopathy.  Neurological: She is alert and oriented to person, place, and time. She has normal reflexes. No cranial nerve deficit. She exhibits normal muscle tone. Coordination normal.  Skin: Skin is warm and dry. No rash noted. She is not diaphoretic. No erythema. No pallor.  Psychiatric: She has a normal mood and affect. Her behavior is normal. Judgment and thought content normal.  Vitals reviewed.  Vitals:   03/17/17 1047  BP: 100/64  Pulse: 82  SpO2: 96%  Weight: 129 lb (58.5 kg)  Height: 5' 2.5" (1.588 m)     Estimated body mass index is 23.22 kg/m as calculated from the following:   Height as of this encounter: 5' 2.5" (1.588 m).   Weight as of this encounter: 129 lb (58.5 kg).     Assessment:       ICD-10-CM   1. Mild persistent asthma, unspecified whether complicated S92.33   2. Lung nodule R91.1        Plan:      #asthma - well controlled - use albuterol as needed  #Lung nodule  -stable and no further followup   Followup - 12 months or sooner iif needed   Dr. Brand Males, M.D., [facility].C.P Pulmonary and Critical Care  Medicine Staff Physician, [place] Director - Interstitial Lung Disease  Program  Pulmonary [place] [facility] [place], [place], [address]  Pager: [phone], If no answer or between  15:00h - 7:00h: call [phone] Telephone: [phone]

## 2017-03-24 ENCOUNTER — Telehealth: Payer: Self-pay | Admitting: *Deleted

## 2017-03-24 ENCOUNTER — Other Ambulatory Visit: Payer: Self-pay | Admitting: *Deleted

## 2017-03-24 ENCOUNTER — Ambulatory Visit (INDEPENDENT_AMBULATORY_CARE_PROVIDER_SITE_OTHER): Payer: Medicare Other | Admitting: Family Medicine

## 2017-03-24 ENCOUNTER — Encounter: Payer: Self-pay | Admitting: Family Medicine

## 2017-03-24 VITALS — BP 98/64 | Ht 62.5 in | Wt 129.0 lb

## 2017-03-24 DIAGNOSIS — M549 Dorsalgia, unspecified: Secondary | ICD-10-CM

## 2017-03-24 DIAGNOSIS — E038 Other specified hypothyroidism: Secondary | ICD-10-CM

## 2017-03-24 MED ORDER — LEVOTHYROXINE SODIUM 88 MCG PO TABS: 88.0000 ug | ORAL_TABLET | Freq: Every day | ORAL | 1 refills | Status: DC

## 2017-03-24 MED ORDER — HYDROCODONE-ACETAMINOPHEN 10-325 MG PO TABS: ORAL_TABLET | ORAL | 0 refills | Status: DC

## 2017-03-24 MED ORDER — LEVOTHYROXINE SODIUM 88 MCG PO TABS: 88.0000 ug | ORAL_TABLET | Freq: Every day | ORAL | 0 refills | Status: DC

## 2017-03-24 MED ORDER — CHLORZOXAZONE 500 MG PO TABS: 500.0000 mg | ORAL_TABLET | Freq: Three times a day (TID) | ORAL | 2 refills | Status: DC | PRN

## 2017-03-24 MED ORDER — DICLOFENAC SODIUM 75 MG PO TBEC: 75.0000 mg | DELAYED_RELEASE_TABLET | Freq: Two times a day (BID) | ORAL | 1 refills | Status: DC

## 2017-03-24 MED ORDER — ONDANSETRON HCL 4 MG PO TABS: 4.0000 mg | ORAL_TABLET | Freq: Four times a day (QID) | ORAL | 6 refills | Status: DC

## 2017-03-24 NOTE — Telephone Encounter (Signed)
Pt seen today and got 3 scripts of hydrocodone 10/325 one tablet up to four times daily. #10. Pt was suppose to get #110 not #10 per Dr. Nicki Reaper. Pt already filled one script with 10 tablets. Ok to print one rx for #100 and two other scripts for #110 per dr. Nicki Reaper. Pt to bring back the two scripts she got today and get the corrected scripts. Pt states she will pickup tomorrow.

## 2017-03-24 NOTE — Patient Instructions (Signed)

## 2017-03-24 NOTE — Progress Notes (Signed)
   Subjective:    Patient ID: Jenna Contreras, female    DOB: Mar 31, 1945, 72 y.o.   MRN: [id number]  HPI This patient was seen today for chronic pain.  Takes for back pain   The medication list was reviewed and updated.   -Compliance with medication: takes hyrdrocodone 3 -4 times a day.   - Number patient states they take daily: 3-4 a day  -when was the last dose patient took? This morning  The patient was advised the importance of maintaining medication and not using illegal substances with these.  Refills needed: yes  The patient was educated that we can provide 3 monthly scripts for their medication, it is their responsibility to follow the instructions.  Side effects or complications from medications: none  Patient is aware that pain medications are meant to minimize the severity of the pain to allow their pain levels to improve to allow for better function. They are aware of that pain medications cannot totally remove their pain.  Due for UDT ( at least once per year) : last one sept 2018  Needs refill on synthroid, zofran, hydrocoodne, and parafon forte. Would like 90 day on all meds and sent to mail order.   Needs 10 day supply of synthroid to [facility] in [place] and 90 day to mail order.           Review of Systems  Constitutional: Negative for activity change and appetite change.  HENT: Negative for congestion.   Respiratory: Negative for cough.   Cardiovascular: Negative for chest pain.  Gastrointestinal: Negative for abdominal pain and vomiting.  Skin: Negative for color change.  Neurological: Negative for weakness.  Psychiatric/Behavioral: Negative for confusion.       Objective:   Physical Exam  Constitutional: She appears well-nourished. No distress.  HENT:  Head: Normocephalic.  Right Ear: External ear normal.  Left Ear: External ear normal.  Eyes: Right eye exhibits no discharge. Left eye exhibits no discharge.  Neck: No tracheal deviation  present.  Cardiovascular: Normal rate, regular rhythm and normal heart sounds.  No murmur heard. Pulmonary/Chest: Effort normal and breath sounds normal. No respiratory distress. She has no wheezes. She has no rales.  Musculoskeletal: She exhibits no edema.  Lymphadenopathy:    She has no cervical adenopathy.  Neurological: She is alert.  Psychiatric: Her behavior is normal.  Vitals reviewed.         Assessment & Plan:  Patient with ongoing back pain despite having surgery she does benefit from pain medicine it does allow her to be more functional she denies abusing it she takes 3 or 4/day we will go ahead and write her prescription that will allow her to have 4 on some days 3 on other days she understands that this must last 30 days 3 prescriptions were given she will follow-up in 3 months  Hypothyroidism continue medication refills given on this medicine lab work ordered await the results

## 2017-03-25 ENCOUNTER — Encounter: Payer: Self-pay | Admitting: Family Medicine

## 2017-03-25 LAB — T4, FREE: Free T4: 1.73 ng/dL (ref 0.82–1.77)

## 2017-03-25 LAB — TSH: TSH: 1.21 u[IU]/mL (ref 0.450–4.500)

## 2017-04-01 ENCOUNTER — Ambulatory Visit (INDEPENDENT_AMBULATORY_CARE_PROVIDER_SITE_OTHER): Payer: Medicare Other | Admitting: Family Medicine

## 2017-04-01 ENCOUNTER — Encounter: Payer: Self-pay | Admitting: Family Medicine

## 2017-04-01 VITALS — Temp 99.4°F | Ht 62.5 in | Wt 129.0 lb

## 2017-04-01 DIAGNOSIS — J111 Influenza due to unidentified influenza virus with other respiratory manifestations: Secondary | ICD-10-CM | POA: Diagnosis not present

## 2017-04-01 DIAGNOSIS — H65111 Acute and subacute allergic otitis media (mucoid) (sanguinous) (serous), right ear: Secondary | ICD-10-CM | POA: Diagnosis not present

## 2017-04-01 MED ORDER — OSELTAMIVIR PHOSPHATE 75 MG PO CAPS: 75.0000 mg | ORAL_CAPSULE | Freq: Two times a day (BID) | ORAL | 0 refills | Status: DC

## 2017-04-01 MED ORDER — CLINDAMYCIN HCL 300 MG PO CAPS: 300.0000 mg | ORAL_CAPSULE | Freq: Three times a day (TID) | ORAL | 0 refills | Status: DC

## 2017-04-01 NOTE — Patient Instructions (Signed)

## 2017-04-01 NOTE — Progress Notes (Signed)
   Subjective:    Patient ID: Jenna Contreras, female    DOB: 07/15/1945, 72 y.o.   MRN: [id number]  Cough  This is a new problem. The current episode started in the past 7 days. Associated symptoms include ear pain, a fever, headaches, myalgias, nasal congestion and a sore throat. Associated symptoms comments: Nausea, abdominal pain.   Patient over the past 48 hours with the onset of fever chills body aches runny nose sore throat cough not feeling good denies wheezing difficulty breathing but does relate feeling very fatigued   Review of Systems  Constitutional: Positive for fever.  HENT: Positive for ear pain and sore throat.   Respiratory: Positive for cough.   Musculoskeletal: Positive for myalgias.  Neurological: Positive for headaches.       Objective:   Physical Exam Eardrums are normal throat is normal neck no masses lungs are clear heart regular no murmurs extremities no edema  Early otitis media right ear noted antibiotic prescribed     Assessment & Plan:  Influenza-the patient was diagnosed with influenza. Patient/family educated about the flu and warning signs to watch for. If difficulty breathing, severe neck pain and stiffness, cyanosis, disorientation, or progressive worsening then immediately get rechecked at that ER. If progressive symptoms be certain to be rechecked. Supportive measures such as Tylenol/ibuprofen was discussed. No aspirin use in children. And influenza home care instruction sheet was given. Warning signs discussed in detail

## 2017-04-02 ENCOUNTER — Telehealth: Payer: Self-pay | Admitting: Family Medicine

## 2017-04-02 NOTE — Telephone Encounter (Signed)
Patient states she is now breaking out in a rash in the same area the shingles were the lasts time. It itches pretty bad. She states she wanted to let Dr. Nicki Reaper know. She states she was on valtrex the last time for this.She says she did not want any medication for it,but I asked if the Dr. Lennette Bihari she should start something where did she want it called to. I will forward this to Dr.Steve and Dr.Scott.

## 2017-04-02 NOTE — Telephone Encounter (Signed)
Very difficult to teell over the ophone and all filled up for today, redc o v with dr Nicki Reaper Monday, since you sent this to Dr scott too(? u may want to ck if he has responded al;so

## 2017-04-02 NOTE — Telephone Encounter (Signed)
Patient said she is breaking out again around her mid-section.  She said she just recently went through shingles in the same area.  She wants to know if its possible that she is getting shingles again?

## 2017-04-02 NOTE — Telephone Encounter (Signed)
Patient scheduled follow up office with Dr Nicki Reaper on Monday- ER this weekend if worse.

## 2017-04-05 ENCOUNTER — Telehealth: Payer: Self-pay | Admitting: Family Medicine

## 2017-04-05 ENCOUNTER — Ambulatory Visit: Payer: Medicare Other | Admitting: Family Medicine

## 2017-04-05 NOTE — Telephone Encounter (Signed)
FYI only, Patient canceled appt this morning saying that the rash never got any worse and she doesn't need to be seen and she wanted Dr. Nicki Reaper to know that.

## 2017-04-27 ENCOUNTER — Ambulatory Visit (INDEPENDENT_AMBULATORY_CARE_PROVIDER_SITE_OTHER): Payer: Medicare Other | Admitting: Family Medicine

## 2017-04-27 ENCOUNTER — Encounter: Payer: Self-pay | Admitting: Family Medicine

## 2017-04-27 VITALS — BP 124/82 | Temp 97.8°F | Ht 62.5 in | Wt 128.8 lb

## 2017-04-27 DIAGNOSIS — B9689 Other specified bacterial agents as the cause of diseases classified elsewhere: Secondary | ICD-10-CM | POA: Diagnosis not present

## 2017-04-27 DIAGNOSIS — J019 Acute sinusitis, unspecified: Secondary | ICD-10-CM

## 2017-04-27 MED ORDER — CIPROFLOXACIN HCL 500 MG PO TABS: 500.0000 mg | ORAL_TABLET | Freq: Two times a day (BID) | ORAL | 0 refills | Status: DC

## 2017-04-27 NOTE — Progress Notes (Signed)
   Subjective:    Patient ID: Jenna Contreras, female    DOB: 25-Aug-1945, 72 y.o.   MRN: [id number]  Sinusitis  Associated symptoms include congestion, coughing and ear pain. Pertinent negatives include no shortness of breath. (Diarrhea, runny nose) Past treatments include nothing.   Pt is post flu from Jan 24,2019 Sinus symptoms over the past week head congestion drainage coughing sinus pressure denies wheezing difficulty breathing  Review of Systems  Constitutional: Negative for activity change and fever.  HENT: Positive for congestion, ear pain and rhinorrhea.   Eyes: Negative for discharge.  Respiratory: Positive for cough. Negative for shortness of breath and wheezing.   Cardiovascular: Negative for chest pain.       Objective:   Physical Exam  Constitutional: She appears well-developed.  HENT:  Head: Normocephalic.  Right Ear: External ear normal.  Left Ear: External ear normal.  Nose: Nose normal.  Mouth/Throat: Oropharynx is clear and moist. No oropharyngeal exudate.  Eyes: Right eye exhibits no discharge. Left eye exhibits no discharge.  Neck: Neck supple. No tracheal deviation present.  Cardiovascular: Normal rate and normal heart sounds.  No murmur heard. Pulmonary/Chest: Effort normal and breath sounds normal. She has no wheezes. She has no rales.  Lymphadenopathy:    She has no cervical adenopathy.  Skin: Skin is warm and dry.  Nursing note and vitals reviewed.    I do not find any evidence of pneumonia I do not recommend any type of x-rays or lab work currently     Assessment & Plan:  Patient was seen today for upper respiratory illness. It is felt that the patient is dealing with sinusitis. Antibiotics were prescribed today. Importance of compliance with medication was discussed. Symptoms should gradually resolve over the course of the next several days. If high fevers, progressive illness, difficulty breathing, worsening condition or failure for  symptoms to improve over the next several days then the patient is to follow-up. If any emergent conditions the patient is to follow-up in the emergency department otherwise to follow-up in the office.

## 2017-05-12 ENCOUNTER — Ambulatory Visit: Payer: Medicare Other | Admitting: Gastroenterology

## 2017-05-19 DIAGNOSIS — F319 Bipolar disorder, unspecified: Secondary | ICD-10-CM | POA: Diagnosis not present

## 2017-05-19 DIAGNOSIS — F4310 Post-traumatic stress disorder, unspecified: Secondary | ICD-10-CM | POA: Diagnosis not present

## 2017-05-19 DIAGNOSIS — F411 Generalized anxiety disorder: Secondary | ICD-10-CM | POA: Diagnosis not present

## 2017-05-19 DIAGNOSIS — Z8659 Personal history of other mental and behavioral disorders: Secondary | ICD-10-CM | POA: Diagnosis not present

## 2017-06-17 ENCOUNTER — Encounter: Payer: Self-pay | Admitting: Family Medicine

## 2017-06-17 ENCOUNTER — Ambulatory Visit (INDEPENDENT_AMBULATORY_CARE_PROVIDER_SITE_OTHER): Payer: Medicare Other | Admitting: Family Medicine

## 2017-06-17 VITALS — BP 122/82 | Ht 62.5 in | Wt 134.6 lb

## 2017-06-17 DIAGNOSIS — E785 Hyperlipidemia, unspecified: Secondary | ICD-10-CM

## 2017-06-17 DIAGNOSIS — M546 Pain in thoracic spine: Secondary | ICD-10-CM | POA: Diagnosis not present

## 2017-06-17 DIAGNOSIS — Z1159 Encounter for screening for other viral diseases: Secondary | ICD-10-CM | POA: Diagnosis not present

## 2017-06-17 DIAGNOSIS — G8929 Other chronic pain: Secondary | ICD-10-CM | POA: Diagnosis not present

## 2017-06-17 MED ORDER — HYDROCODONE-ACETAMINOPHEN 10-325 MG PO TABS: ORAL_TABLET | ORAL | 0 refills | Status: DC

## 2017-06-17 NOTE — Progress Notes (Signed)
   Subjective:    Patient ID: Jenna Contreras, female    DOB: 1945-04-21, 72 y.o.   MRN: [id number]  HPI  This patient was seen today for chronic pain  The medication list was reviewed and updated.   -Compliance with medication: yes  - Number patient states they take daily: 2  -when was the last dose patient took? This am  The patient was advised the importance of maintaining medication and not using illegal substances with these.  Here for refills and follow up  The patient was educated that we can provide 3 monthly scripts for their medication, it is their responsibility to follow the instructions.  Side effects or complications from medications: none  Patient is aware that pain medications are meant to minimize the severity of the pain to allow their pain levels to improve to allow for better function. They are aware of that pain medications cannot totally remove their pain.  Due for UDT ( at least once per year) : 11/2016  This patient has severe mid back pain despite having surgery.  She was on a fair amount of medication especially after having injury and shingles but she is doing better now she describes the pain is moderate pain medicine does help does not make her drowsy she is taking it anywhere between 2 and 3 times per day she denies abusing the medication drug registry was checked   Patient does take the anti-inflammatory to help with her back pain She is under the care of a specialist for her depression She does take her thyroid medicine on a regular basis Recent lab work looked good   Review of Systems  Constitutional: Negative for activity change and fever.  HENT: Positive for congestion and rhinorrhea. Negative for ear pain.   Eyes: Negative for discharge.  Respiratory: Positive for cough. Negative for shortness of breath and wheezing.   Cardiovascular: Negative for chest pain.       Objective:   Physical Exam  Constitutional: She appears  well-developed.  HENT:  Head: Normocephalic.  Right Ear: External ear normal.  Left Ear: External ear normal.  Nose: Nose normal.  Mouth/Throat: Oropharynx is clear and moist. No oropharyngeal exudate.  Eyes: Right eye exhibits no discharge. Left eye exhibits no discharge.  Neck: Neck supple. No tracheal deviation present.  Cardiovascular: Normal rate and normal heart sounds.  No murmur heard. Pulmonary/Chest: Effort normal and breath sounds normal. She has no wheezes. She has no rales.  Lymphadenopathy:    She has no cervical adenopathy.  Skin: Skin is warm and dry.  Nursing note and vitals reviewed.         Assessment & Plan:  The patient was seen today as part of a comprehensive visit regarding pain control. Patient's compliance with the medication as well as discussion regarding effectiveness was completed. Prescriptions were written. Patient was advised to follow-up in 3 months. The patient was assessed for any signs of severe side effects. The patient was advised to take the medicine as directed and to report to [place] if any side effect issues. Prescriptions given drug registry checked prescription should last for 3-4 months follow-up at that point

## 2017-06-18 ENCOUNTER — Ambulatory Visit: Payer: Medicare Other | Admitting: Family Medicine

## 2017-06-30 DIAGNOSIS — Z8659 Personal history of other mental and behavioral disorders: Secondary | ICD-10-CM | POA: Diagnosis not present

## 2017-06-30 DIAGNOSIS — F319 Bipolar disorder, unspecified: Secondary | ICD-10-CM | POA: Diagnosis not present

## 2017-06-30 DIAGNOSIS — F411 Generalized anxiety disorder: Secondary | ICD-10-CM | POA: Diagnosis not present

## 2017-06-30 DIAGNOSIS — F4310 Post-traumatic stress disorder, unspecified: Secondary | ICD-10-CM | POA: Diagnosis not present

## 2017-07-01 ENCOUNTER — Other Ambulatory Visit: Payer: Self-pay | Admitting: Internal Medicine

## 2017-07-04 ENCOUNTER — Emergency Department (HOSPITAL_COMMUNITY)
Admission: EM | Admit: 2017-07-04 | Discharge: 2017-07-04 | Disposition: A | Discharge status: Home / Self Care | Payer: Medicare Other | Attending: Emergency Medicine | Admitting: Emergency Medicine

## 2017-07-04 ENCOUNTER — Encounter (HOSPITAL_COMMUNITY): Payer: Self-pay | Admitting: Emergency Medicine

## 2017-07-04 ENCOUNTER — Emergency Department (HOSPITAL_COMMUNITY): Payer: Medicare Other

## 2017-07-04 ENCOUNTER — Other Ambulatory Visit: Payer: Self-pay

## 2017-07-04 DIAGNOSIS — Y9301 Activity, walking, marching and hiking: Secondary | ICD-10-CM | POA: Diagnosis not present

## 2017-07-04 DIAGNOSIS — S060X0A Concussion without loss of consciousness, initial encounter: Secondary | ICD-10-CM | POA: Diagnosis not present

## 2017-07-04 DIAGNOSIS — S0990XA Unspecified injury of head, initial encounter: Secondary | ICD-10-CM | POA: Diagnosis not present

## 2017-07-04 DIAGNOSIS — Y999 Unspecified external cause status: Secondary | ICD-10-CM | POA: Insufficient documentation

## 2017-07-04 DIAGNOSIS — Z96653 Presence of artificial knee joint, bilateral: Secondary | ICD-10-CM | POA: Diagnosis not present

## 2017-07-04 DIAGNOSIS — E039 Hypothyroidism, unspecified: Secondary | ICD-10-CM | POA: Insufficient documentation

## 2017-07-04 DIAGNOSIS — J45909 Unspecified asthma, uncomplicated: Secondary | ICD-10-CM | POA: Diagnosis not present

## 2017-07-04 DIAGNOSIS — Z79899 Other long term (current) drug therapy: Secondary | ICD-10-CM | POA: Diagnosis not present

## 2017-07-04 DIAGNOSIS — Z96641 Presence of right artificial hip joint: Secondary | ICD-10-CM | POA: Insufficient documentation

## 2017-07-04 DIAGNOSIS — M545 Low back pain: Secondary | ICD-10-CM | POA: Diagnosis not present

## 2017-07-04 DIAGNOSIS — Y929 Unspecified place or not applicable: Secondary | ICD-10-CM | POA: Insufficient documentation

## 2017-07-04 DIAGNOSIS — Z87891 Personal history of nicotine dependence: Secondary | ICD-10-CM | POA: Diagnosis not present

## 2017-07-04 DIAGNOSIS — S161XXA Strain of muscle, fascia and tendon at neck level, initial encounter: Secondary | ICD-10-CM | POA: Insufficient documentation

## 2017-07-04 DIAGNOSIS — W19XXXA Unspecified fall, initial encounter: Secondary | ICD-10-CM | POA: Insufficient documentation

## 2017-07-04 DIAGNOSIS — S199XXA Unspecified injury of neck, initial encounter: Secondary | ICD-10-CM | POA: Diagnosis not present

## 2017-07-04 DIAGNOSIS — Z91040 Latex allergy status: Secondary | ICD-10-CM | POA: Diagnosis not present

## 2017-07-04 DIAGNOSIS — S299XXA Unspecified injury of thorax, initial encounter: Secondary | ICD-10-CM | POA: Diagnosis not present

## 2017-07-04 DIAGNOSIS — R51 Headache: Secondary | ICD-10-CM | POA: Diagnosis not present

## 2017-07-04 DIAGNOSIS — M542 Cervicalgia: Secondary | ICD-10-CM | POA: Diagnosis not present

## 2017-07-04 NOTE — ED Triage Notes (Signed)
Patient c/o headache, confusion, and swelling/pain to left side of neck. Per patient fell Friday while walking up a hill. Patient states back of head with instant pain. Denies taking blood thinners. Per patient swelling in left side of neck decreased but pain still persists.

## 2017-07-04 NOTE — ED Provider Notes (Signed)
[facility] EMERGENCY DEPARTMENT Provider Note   CSN: [id number] Arrival date & time: 07/04/17  1547     History   Chief Complaint Chief Complaint  Patient presents with  . Head Injury    HPI Jenna Contreras is a 72 y.o. female.  Patient states she fell down an embankment on Friday backwards striking her head hard and injuring her neck and her upper thoracic back.  No loss of consciousness.  Since that time she has had persistent neck pain and headache is helped like she is has some confusion her friends have noted that her speech was slightly off.  No other injuries patient is not on blood thinners.  No motor weakness no numbness no incontinence.  No abdominal pain no chest pain no shortness of breath.  Patient's primary care doctor is Dr. Liborio Nixon     Past Medical History:  Diagnosis Date  . Anxiety   . Arthritis   . Arthritis   . Asthma   . Bipolar 2 disorder ([place])   . Cancer (HCC)    Basal Cell  . Constipation   . Depression   . Fibromyalgia   . GERD (gastroesophageal reflux disease)   . Heart murmur    As small child   . History of gout   . History of skin cancer   . HNP (herniated nucleus pulposus with myelopathy), thoracic   . Hypothyroidism   . Left eye injury    In ED 06/17/2014   . Low BP   . Migraines   . OCD (obsessive compulsive disorder)   . Osteoporosis   . Psoriasis   . Scoliosis     Patient Active Problem List   Diagnosis Date Noted  . Abdominal pain, epigastric 09/18/2015  . FH: colon cancer 09/18/2015  . Osteoporosis 05/20/2015  . Moderate single current episode of major depressive disorder ([place]) 02/18/2015  . S/P right UKR 07/02/2014  . Status post unilateral knee replacement 07/02/2014  . Asthma, mild intermittent 02/06/2014  . Lung nodule 02/06/2014  . Hyperlipidemia 01/18/2014  . Expected blood loss anemia 05/10/2013  . Overweight (BMI 25.0-29.9) 05/10/2013  . S/P right THA, AA 05/09/2013  . Pre-operative respiratory  examination 05/07/2013  . Post-nasal drainage 05/07/2013  . Endometrial polyp 02/28/2013  . Osteoarthritis of right hip 02/15/2013  . Precordial pain 12/07/2012  . Chronic back pain 08/17/2012  . Hypothyroid 08/17/2012  . Osteopenia 08/17/2012  . Hypersomnia 11/18/2011  . Asthma, mild persistent 08/05/2011  . Memory change 08/05/2011  . TIA (transient ischemic attack) 08/05/2011  . Pulmonary nodule 08/05/2011  . SOB (shortness of breath) 01/05/2011    Past Surgical History:  Procedure Laterality Date  . Arthroscopic left knee surgery    . DILATION AND CURETTAGE OF UTERUS  x3  . HIP SURGERY Right 2015  . iliac wings    . KNEE SURGERY Bilateral 2015, 2016  . MANDIBLE SURGERY     X 2  . PARTIAL KNEE ARTHROPLASTY Left 02/12/2014   Procedure: LEFT KNEE UNICOMPARTMENTAL MEDIALLY ARTHROPLASTY ;  Surgeon: Mauri Pole, MD;  Location: [facility];  Service: Orthopedics;  Laterality: Left;  . PARTIAL KNEE ARTHROPLASTY Right 07/02/2014   Procedure: RIGHT UNI KNEE ARTHOPLASTY MEDIALLY;  Surgeon: Paralee Cancel, MD;  Location: [facility];  Service: Orthopedics;  Laterality: Right;  . rod in spine     scoliosis   . TONSILLECTOMY    . TOTAL HIP ARTHROPLASTY Right 05/09/2013   Procedure: RIGHT TOTAL HIP ARTHROPLASTY ANTERIOR APPROACH;  Surgeon:  Mauri Pole, MD;  Location: [facility];  Service: Orthopedics;  Laterality: Right;  . TUBAL LIGATION    . WRIST SURGERY       OB History   None      Home Medications    Prior to Admission medications   Medication Sig Start Date End Date Taking? Authorizing Provider  chlorpheniramine (CHLOR-TRIMETON) 4 MG tablet Take 4 mg by mouth 2 (two) times daily as needed for allergies. Reported on 05/20/2015    [provider]  chlorzoxazone (PARAFON FORTE DSC) 500 MG tablet Take 1 tablet (500 mg total) by mouth 3 (three) times daily as needed for muscle spasms. 03/24/17   Kathyrn Drown, MD  Cholecalciferol (VITAMIN D) 2000 UNITS CAPS Take 1 capsule by mouth  2 (two) times daily. Reported on 09/18/2015    [provider]  diclofenac (VOLTAREN) 75 MG EC tablet Take 1 tablet (75 mg total) by mouth 2 (two) times daily. 03/24/17   Kathyrn Drown, MD  escitalopram (LEXAPRO) 10 MG tablet Take 1 tablet (10 mg total) by mouth daily. 08/20/16   Kathyrn Drown, MD  fluticasone (FLONASE) 50 MCG/ACT nasal spray Place 1 spray into both nostrils 2 (two) times daily. Patient taking differently: Place 2 sprays into both nostrils 2 (two) times daily.  10/05/14   Kathyrn Drown, MD  gabapentin (NEURONTIN) 100 MG capsule Take 100 mg by mouth 3 (three) times daily.    [provider]  HYDROcodone-acetaminophen (NORCO) 10-325 MG tablet Take one tablet up to 3 times daily. 06/17/17   Kathyrn Drown, MD  lamoTRIgine (LAMICTAL) 100 MG tablet Take 1 tablet (100 mg total) by mouth 2 (two) times daily. 02/18/15   Kathyrn Drown, MD  levothyroxine (SYNTHROID, LEVOTHROID) 88 MCG tablet Take 1 tablet (88 mcg total) by mouth daily before breakfast. 03/24/17   Luking, Elayne Snare, MD  LORazepam (ATIVAN) 0.5 MG tablet TAKE 1 TABLET BY MOUTH TWICE A DAY AS NEEDED FOR ANXIETY 08/20/16   Luking, Aairah Negrette A, MD  MAXALT 10 MG tablet TAKE 1 TABLET ONCE AS NEEDED FOR MIGRAINE. MAY REPEAT IN 2 HOURS IF NEEDED, NO GREATER THAN 4 TABLETS PER WEEK (MUST LAST 90 DAYS) 09/28/16   Luking, Elayne Snare, MD  ondansetron (ZOFRAN) 4 MG tablet Take 1 tablet (4 mg total) by mouth every 6 (six) hours. 03/24/17   Kathyrn Drown, MD  pantoprazole (PROTONIX) 40 MG tablet Take 1 tablet (40 mg total) by mouth daily. 07/23/16   Kathyrn Drown, MD  PROAIR HFA 108 [phone] Base) MCG/ACT inhaler USE 2 INHALATIONS EVERY 4 HOURS AS NEEDED FOR WHEEZING OR SHORTNESS OF BREATH 07/01/17   Brand Males, MD    Family History Family History  Problem Relation Age of Onset  . Heart disease Mother   . Diabetes Mother   . Colon cancer Father 25  . Heart disease Brother     Social History Social History   Tobacco Use   . Smoking status: Former Smoker    Packs/day: 2.00    Years: 19.00    Pack years: 38.00    Types: Cigarettes    Last attempt to quit: 03/10/1979    Years since quitting: 38.3  . Smokeless tobacco: Never Used  . Tobacco comment: Quit in 1981  Substance Use Topics  . Alcohol use: No    Alcohol/week: 0.0 oz    Comment: no  . Drug use: No     Allergies   Prednisone; Adhesive [tape];  Betadine [povidone iodine]; Demerol [meperidine]; Latex; Levaquin [levofloxacin in d5w]; Silvadene [silver sulfadiazine]; Ampicillin; Cephalexin; Doxycycline; Erythromycin; Keflex [cephalexin]; Penicillins; and Tetracyclines & related   Review of Systems Review of Systems  Constitutional: Negative for fever.  HENT: Negative for congestion.   Eyes: Negative for visual disturbance.  Respiratory: Negative for shortness of breath.   Cardiovascular: Negative for chest pain.  Gastrointestinal: Negative for abdominal pain.  Genitourinary: Negative for hematuria.  Musculoskeletal: Positive for back pain and neck pain.  Skin: Negative for wound.  Neurological: Positive for headaches. Negative for syncope, weakness and numbness.     Physical Exam Updated Vital Signs BP (!) 112/53   Pulse (!) 56   Temp 98.2 F (36.8 C) (Oral)   Resp 18   Ht 1.588 m (5' 2.5")   Wt 60.8 kg (134 lb)   SpO2 99%   BMI 24.12 kg/m   Physical Exam  Constitutional: She is oriented to person, place, and time. She appears well-developed and well-nourished. No distress.  HENT:  Head: Normocephalic and atraumatic.  Mouth/Throat: Oropharynx is clear and moist.  Eyes: Pupils are equal, round, and reactive to light. Conjunctivae and EOM are normal.  Neck: Normal range of motion. Neck supple.  Patient cervical spine with good range of motion.  Some mild tenderness to palpation at the base of the cervical spine.  No tenderness to palpation to the thoracic spine.  Cardiovascular: Normal rate, regular rhythm and normal heart sounds.   Pulmonary/Chest: Effort normal and breath sounds normal.  Abdominal: Soft. Bowel sounds are normal. There is no tenderness.  Musculoskeletal: Normal range of motion.  Neurological: She is alert and oriented to person, place, and time. No cranial nerve deficit or sensory deficit. She exhibits normal muscle tone. Coordination normal.  Skin: Skin is warm.  Nursing note and vitals reviewed.    ED Treatments / Results  Labs (all labs ordered are listed, but only abnormal results are displayed) Labs Reviewed - No data to display  EKG None  Radiology Ct Head Wo Contrast  Result Date: 07/04/2017 CLINICAL DATA:  Headache and confusion. Left-sided neck pain after fall Friday. EXAM: CT HEAD WITHOUT CONTRAST CT CERVICAL SPINE WITHOUT CONTRAST TECHNIQUE: Multidetector CT imaging of the head and cervical spine was performed following the standard protocol without intravenous contrast. Multiplanar CT image reconstructions of the cervical spine were also generated. COMPARISON:  05/15/2016 FINDINGS: CT HEAD FINDINGS Brain: No evidence of acute infarction, hemorrhage, hydrocephalus, extra-axial collection or mass lesion/mass effect. Vascular: No hyperdense vessel or unexpected calcification. Skull: Intact skull. Two metallic tabs embedded within the scalp project over the parietal skull bilaterally, similar in appearance to prior. Sinuses/Orbits: Moderate ethmoid and mild right maxillary sinus mucosal thickening. No air-fluid levels. Clear mastoids. Intact orbits and globes. Other: None CT CERVICAL SPINE FINDINGS Alignment: Stable grade 1 anterolisthesis of C4 on C5. This is believed to be facet mediated. Skull base and vertebrae: No acute fracture. No primary bone lesion or focal pathologic process. Soft tissues and spinal canal: No prevertebral fluid or swelling. No visible canal hematoma. Disc levels: Mild disc space narrowing C4-5 and mild-to-moderate at C5-6. No canal or significant foraminal stenosis.  Degenerative facet arthropathy bilaterally at C2-3 and more left-sided from C3-4 through C6-7. Upper chest: Negative. Other: None IMPRESSION: 1. No acute intracranial abnormality. 2. No acute cervical spine fracture or posttraumatic listhesis. Chronic degenerative change as above. Electronically Signed   By: Ashley Royalty M.D.   On: 07/04/2017 18:12   Ct Cervical Spine Wo  Contrast  Result Date: 07/04/2017 CLINICAL DATA:  Headache and confusion. Left-sided neck pain after fall Friday. EXAM: CT HEAD WITHOUT CONTRAST CT CERVICAL SPINE WITHOUT CONTRAST TECHNIQUE: Multidetector CT imaging of the head and cervical spine was performed following the standard protocol without intravenous contrast. Multiplanar CT image reconstructions of the cervical spine were also generated. COMPARISON:  05/15/2016 FINDINGS: CT HEAD FINDINGS Brain: No evidence of acute infarction, hemorrhage, hydrocephalus, extra-axial collection or mass lesion/mass effect. Vascular: No hyperdense vessel or unexpected calcification. Skull: Intact skull. Two metallic tabs embedded within the scalp project over the parietal skull bilaterally, similar in appearance to prior. Sinuses/Orbits: Moderate ethmoid and mild right maxillary sinus mucosal thickening. No air-fluid levels. Clear mastoids. Intact orbits and globes. Other: None CT CERVICAL SPINE FINDINGS Alignment: Stable grade 1 anterolisthesis of C4 on C5. This is believed to be facet mediated. Skull base and vertebrae: No acute fracture. No primary bone lesion or focal pathologic process. Soft tissues and spinal canal: No prevertebral fluid or swelling. No visible canal hematoma. Disc levels: Mild disc space narrowing C4-5 and mild-to-moderate at C5-6. No canal or significant foraminal stenosis. Degenerative facet arthropathy bilaterally at C2-3 and more left-sided from C3-4 through C6-7. Upper chest: Negative. Other: None IMPRESSION: 1. No acute intracranial abnormality. 2. No acute cervical spine  fracture or posttraumatic listhesis. Chronic degenerative change as above. Electronically Signed   By: Ashley Royalty M.D.   On: 07/04/2017 18:12   Ct Thoracic Spine Wo Contrast  Result Date: 07/04/2017 CLINICAL DATA:  Fall on Friday.  Mid back pain.  Initial encounter. EXAM: CT THORACIC SPINE WITHOUT CONTRAST TECHNIQUE: Multidetector CT images of the thoracic were obtained using the standard protocol without intravenous contrast. COMPARISON:  Chest CT 02/25/2017 FINDINGS: Alignment: Normal Vertebrae: Negative for fracture. T5 to lumbar posterior rod and pedicle screw fixation. The T11 and T12 right pedicle screws are notable for a medial course, but chronic. There is minor lucency around the right T5 pedicle screw but suspect solid posterior incorporation of bone graft. No hardware fracture or displacement is seen. Paraspinal and other soft tissues: No acute finding Disc levels: None no visible impingement. IMPRESSION: 1. No acute finding.  Negative for fracture. 2. T5 to lumbar spinal fusion. Electronically Signed   By: Monte Fantasia M.D.   On: 07/04/2017 18:13    Procedures Procedures (including critical care time)  Medications Ordered in ED Medications - No data to display   Initial Impression / Assessment and Plan / ED Course  I have reviewed the triage vital signs and the nursing notes.  Pertinent labs & imaging results that were available during my care of the patient were reviewed by me and considered in my medical decision making (see chart for details).     Patient's symptoms are consistent with a concussion following the fall and striking the back of her head.  Head CT without any acute findings.  CT of the cervical spine since she had neck pain was negative for any acute findings.  Patient was concerned about her upper thoracic fusion with rods.  CT of that area shows no acute abnormalities.  Patient will be discharged with follow-up with neurology.  Patient symptoms have only been  present since Friday.  Patient will follow-up with neurology if symptoms are not resolving over the next week.  Final Clinical Impressions(s) / ED Diagnoses   Final diagnoses:  Injury of head, initial encounter  Concussion without loss of consciousness, initial encounter  Acute strain of neck muscle, initial  encounter    ED Discharge Orders    None       Fredia Sorrow, MD 07/04/17 [phone]

## 2017-07-04 NOTE — Discharge Instructions (Addendum)
Make an appointment to follow-up with neurology.  Continue your pain medications that you have at home.  Today's CT head neck and thoracic spine without any acute injuries.  Symptoms most consistent with a concussion from the fall.

## 2017-07-05 ENCOUNTER — Telehealth: Payer: Self-pay | Admitting: Family Medicine

## 2017-07-05 NOTE — Telephone Encounter (Signed)
It would probably be wise for this patient to do a follow-up visit later this week regarding her symptoms as a follow-up from the ER

## 2017-07-05 NOTE — Telephone Encounter (Signed)
Patient had a bad fall last Friday and she hit the back of her head.  She said she went to the ER yesterday and was diagnosed with a bad concussion.  She said she was told she cannot read, watch TV, text, etc.  She was instructed to take her hydrocodone for pain.  She said she was told by the ER to give Dr. Nicki Reaper a call and let him know what was going on.

## 2017-07-05 NOTE — Telephone Encounter (Signed)
That would be fine no need to follow-up with [place] at this time

## 2017-07-05 NOTE — Telephone Encounter (Signed)
Patient states she was advised by ER to follow up with neurology the end of the week and already has a follow up appointment scheduled with Dr Merlene Laughter for concussion follow up.

## 2017-07-07 ENCOUNTER — Ambulatory Visit: Payer: Medicare Other | Admitting: Gastroenterology

## 2017-07-07 ENCOUNTER — Telehealth: Payer: Self-pay | Admitting: Family Medicine

## 2017-07-07 NOTE — Telephone Encounter (Signed)
Patient said that the ER advised her that she could not drive due to her recent concussion.  She wants to know if Dr. Nicki Reaper thinks it would be OK for her to drive because she really wants to get to the hospital to see [facility].

## 2017-07-08 ENCOUNTER — Telehealth: Payer: Self-pay | Admitting: Family Medicine

## 2017-07-08 MED ORDER — LEVOTHYROXINE SODIUM 88 MCG PO TABS: 88.0000 ug | ORAL_TABLET | Freq: Every day | ORAL | 0 refills | Status: DC

## 2017-07-08 MED ORDER — GABAPENTIN 100 MG PO CAPS: 100.0000 mg | ORAL_CAPSULE | Freq: Three times a day (TID) | ORAL | 0 refills | Status: DC

## 2017-07-08 MED ORDER — DICLOFENAC SODIUM 75 MG PO TBEC: 75.0000 mg | DELAYED_RELEASE_TABLET | Freq: Two times a day (BID) | ORAL | 0 refills | Status: DC

## 2017-07-08 NOTE — Telephone Encounter (Signed)
Prescriptions sent electronically to pharmacy. Patient stated she has follow up with neurologist- Dr Merlene Laughter the end of the week to follow up on her concussion

## 2017-07-08 NOTE — Telephone Encounter (Signed)
I would recommend talking with the patient.  If she had a seizure she should not drive.  If she did not have a seizure and she is feeling stable behind the wheel of a car she may drive during the day as long as her cognitive skills have resolved.  It is very difficult for [place] to judge purely based on phone.

## 2017-07-08 NOTE — Telephone Encounter (Signed)
Spoke with patient; patient stated that both her and her husband fell. Her husband is currently in the hospital and she thought they were going to d/c him today and she needed to pick him up. Nurse relayed message to patient that if she felt ok to drive, that would be fine. Pt then stated that the nurse at ER told her not to drive, use a computer, text, watch TV or anything that would be using her brain due to her brain being bruised. Nurse explained to patient that rest was needed to help heal. Nurse told patient that it would not be a good idea to drive. Patient states that when she gets up she has a headache and then a few minutes later she will begin to feel woozy. She states she is now taking 4 pain pills instead of her prescribed 3. She did say she was not going to abuse her pain pills. Also states that she needs refills on Synthroid, Neruotin, and Voltaren.

## 2017-07-08 NOTE — Telephone Encounter (Signed)
Telephone call no answer- The mailbox is full (family needs to contact case management at [facility])

## 2017-07-08 NOTE — Telephone Encounter (Signed)
Patient states help with the care of her husband who has been released from the hospital today who is still confusion and low on his insulin.She states he doesn't remember what the doctor told him when he was discharged from hospital.

## 2017-07-08 NOTE — Telephone Encounter (Signed)
She may have refills of the stated medicine for 3 months.  If the patient is having ongoing woozy feeling when she is up she should not drive.  Otherwise the patient should do a follow-up office visit somewhere within the next couple weeks with [place] sooner if any problems

## 2017-07-09 NOTE — Telephone Encounter (Signed)
The wife was advised to try to contact Jenna Contreras's case manager at [facility].  Wife is very upset that patient was sent home in his condition-very confused and with no med- Jenna Contreras is so confused no one can locate his meds and he has not had the since discharge-Jenna Contreras is refusing to go back to hospital. Wife is getting refill of all his meds sent to her house where he is staying because he can not be alone but she is too sick to take care of him. Wife scheduled Jenna Contreras a follow up office visit early next week with Dr Nicki Reaper for hospital follow up.

## 2017-07-09 NOTE — Telephone Encounter (Signed)
Case manager rec is good idea, no perfect answer here, families do have a certain responsibility in this too to understand instructions before discharge , obviously  if worsens needs to return to hospital

## 2017-07-09 NOTE — Telephone Encounter (Signed)
Family is aware to go to [place] if continued issues but patient is currently refusing to go back to the hospital.

## 2017-07-11 NOTE — Telephone Encounter (Signed)
Home health consultation regarding patient's medications and monitoring would be appreciated diagnosis stroke may need home health physical therapy but for now we will start off with home health nursing thank you

## 2017-07-12 NOTE — Telephone Encounter (Signed)
Referral placed for husband home health.

## 2017-07-16 ENCOUNTER — Ambulatory Visit: Payer: Medicare Other | Admitting: Family Medicine

## 2017-07-19 ENCOUNTER — Ambulatory Visit (INDEPENDENT_AMBULATORY_CARE_PROVIDER_SITE_OTHER): Payer: Medicare Other | Admitting: Family Medicine

## 2017-07-19 ENCOUNTER — Telehealth: Payer: Self-pay | Admitting: Family Medicine

## 2017-07-19 ENCOUNTER — Encounter: Payer: Self-pay | Admitting: Family Medicine

## 2017-07-19 VITALS — BP 122/68 | Ht 62.5 in | Wt 134.4 lb

## 2017-07-19 DIAGNOSIS — F0781 Postconcussional syndrome: Secondary | ICD-10-CM | POA: Diagnosis not present

## 2017-07-19 NOTE — Progress Notes (Signed)
   Subjective:    Patient ID: Jenna Contreras, female    DOB: [birth year], 72 y.o.   MRN: [id number]  HPI Pt here for follow up from concussion. Pt states she is swimmy headed and still has headache. Pt states she is not watching TV, not on the computer, but states she is still cooking meals and doing stuff around the house.  I reviewed over all of her medications I do not feel that she needs to take muscle relaxers or gabapentin or trazodone we discussed the rationale behind this Stop these medicines The patient does have some mild cognitive issues.  Review of Systems  Constitutional: Negative for activity change and appetite change.  HENT: Negative for congestion.   Respiratory: Negative for cough.   Cardiovascular: Negative for chest pain.  Gastrointestinal: Negative for abdominal pain and vomiting.  Skin: Negative for color change.  Neurological: Negative for weakness.  Psychiatric/Behavioral: Negative for confusion.       Objective:   Physical Exam  Constitutional: She appears well-nourished. No distress.  Cardiovascular: Normal rate, regular rhythm and normal heart sounds.  No murmur heard. Pulmonary/Chest: Effort normal and breath sounds normal. No respiratory distress.  Musculoskeletal: She exhibits no edema.  Lymphadenopathy:    She has no cervical adenopathy.  Neurological: She is alert. She exhibits normal muscle tone.  Psychiatric: Her behavior is normal.  Vitals reviewed.   I discussed with the patient that it is possible for her to see neurology but I am not certain what they can offer her she understands this and does not want to see neurology currently      Assessment & Plan:  Postconcussion syndrome Minimize meds patients that can contribute to cognitive dysfunction Patient safe to drive Patient to avoid intensive reading watching TV using computer.  Gradually increasing his activities as tolerated  Follow-up for chronic pain later in July

## 2017-07-19 NOTE — Telephone Encounter (Signed)
Patient had an appointment on 09/29/17 with Dr. Nicki Reaper for her medication.  Her check out note said to come back in 3 months which we scheduled for 10/18/17.  She wants to know if it is OK her appointment out that far?

## 2017-07-19 NOTE — Telephone Encounter (Signed)
Discussed with pt. Pt verbalized understanding.  °

## 2017-07-19 NOTE — Telephone Encounter (Signed)
It would be best for her to keep the appointment in July for pain management

## 2017-09-01 ENCOUNTER — Telehealth: Payer: Self-pay | Admitting: Family Medicine

## 2017-09-01 NOTE — Telephone Encounter (Signed)
Pt called to request 90 supply be sent to [facility] for:  MAXALT 10 MG tablet   fluticasone (FLONASE) 50 MCG/ACT nasal spray  ondansetron (ZOFRAN) 4 MG tablet    Please call pt when done

## 2017-09-02 MED ORDER — ONDANSETRON HCL 4 MG PO TABS: 4.0000 mg | ORAL_TABLET | Freq: Four times a day (QID) | ORAL | 1 refills | Status: DC

## 2017-09-02 MED ORDER — RIZATRIPTAN BENZOATE 10 MG PO TABS: ORAL_TABLET | ORAL | 1 refills | Status: DC

## 2017-09-02 MED ORDER — FLUTICASONE PROPIONATE 50 MCG/ACT NA SUSP: 2.0000 | Freq: Two times a day (BID) | NASAL | 1 refills | Status: DC

## 2017-09-02 NOTE — Telephone Encounter (Addendum)
Prescriptions sent electronically to pharmacy. Patient notified. °

## 2017-09-02 NOTE — Telephone Encounter (Signed)
May have 90-day on each 1 of these with one additional refill

## 2017-09-07 ENCOUNTER — Telehealth: Payer: Self-pay | Admitting: Family Medicine

## 2017-09-07 DIAGNOSIS — F4310 Post-traumatic stress disorder, unspecified: Secondary | ICD-10-CM | POA: Diagnosis not present

## 2017-09-07 DIAGNOSIS — Z8659 Personal history of other mental and behavioral disorders: Secondary | ICD-10-CM | POA: Diagnosis not present

## 2017-09-07 DIAGNOSIS — F319 Bipolar disorder, unspecified: Secondary | ICD-10-CM | POA: Diagnosis not present

## 2017-09-07 DIAGNOSIS — F411 Generalized anxiety disorder: Secondary | ICD-10-CM | POA: Diagnosis not present

## 2017-09-07 NOTE — Telephone Encounter (Signed)
Fax from [facility]. Fax states "usual dose for Fluticason Nasal Spray is one spray in each nostril twice daily or 2 sprays in each nostril once daily (max 4 sprays total per day)." Directions for patient is 2 spays into both nostrils 2 times daily. Fax is asking to "fill as written for 2 sprays per nostril twice daily (8 sprays total). I am aware this exceeds the recommended maximum daily dose. Patient is being monitored for side effects. Or to change directions." Please advise. Thanks

## 2017-09-07 NOTE — Telephone Encounter (Signed)
Please change the directions to 2 sprays each nostril once daily-may have 90-day prescription with 3 refills

## 2017-09-08 MED ORDER — FLUTICASONE PROPIONATE 50 MCG/ACT NA SUSP: 2.0000 | Freq: Every day | NASAL | 3 refills | Status: DC

## 2017-09-08 NOTE — Telephone Encounter (Signed)
Done

## 2017-09-08 NOTE — Addendum Note (Signed)
Addended by: Karle Barr on: 09/08/2017 08:52 AM   Modules accepted: Orders

## 2017-09-10 DIAGNOSIS — M7061 Trochanteric bursitis, right hip: Secondary | ICD-10-CM | POA: Diagnosis not present

## 2017-09-10 DIAGNOSIS — Z96651 Presence of right artificial knee joint: Secondary | ICD-10-CM | POA: Diagnosis not present

## 2017-09-10 DIAGNOSIS — Z96641 Presence of right artificial hip joint: Secondary | ICD-10-CM | POA: Diagnosis not present

## 2017-09-10 DIAGNOSIS — G5781 Other specified mononeuropathies of right lower limb: Secondary | ICD-10-CM | POA: Diagnosis not present

## 2017-09-29 ENCOUNTER — Encounter: Payer: Self-pay | Admitting: Family Medicine

## 2017-09-29 ENCOUNTER — Ambulatory Visit (INDEPENDENT_AMBULATORY_CARE_PROVIDER_SITE_OTHER): Payer: Medicare Other | Admitting: Family Medicine

## 2017-09-29 ENCOUNTER — Ambulatory Visit: Payer: Medicare Other | Admitting: Family Medicine

## 2017-09-29 VITALS — BP 118/74 | Ht 62.5 in | Wt 135.0 lb

## 2017-09-29 DIAGNOSIS — F439 Reaction to severe stress, unspecified: Secondary | ICD-10-CM

## 2017-09-29 DIAGNOSIS — M546 Pain in thoracic spine: Secondary | ICD-10-CM

## 2017-09-29 DIAGNOSIS — G8929 Other chronic pain: Secondary | ICD-10-CM | POA: Diagnosis not present

## 2017-09-29 MED ORDER — HYDROCODONE-ACETAMINOPHEN 10-325 MG PO TABS
ORAL_TABLET | ORAL | 0 refills | Status: DC
Start: 2017-09-29 — End: 2017-11-17

## 2017-09-29 MED ORDER — HYDROCODONE-ACETAMINOPHEN 10-325 MG PO TABS: ORAL_TABLET | ORAL | 0 refills | Status: DC

## 2017-09-29 NOTE — Progress Notes (Signed)
Subjective:    Patient ID: Jenna Contreras, female    DOB: 1946-01-23, 72 y.o.   MRN: [id number]  HPI This patient was seen today for chronic pain  The medication list was reviewed and updated.   -Compliance with medication: Yes  - Number patient states they take daily: 1 maybe 2 per day   -when was the last dose patient took? This am  The patient was advised the importance of maintaining medication and not using illegal substances with these.  Here for refills and follow up  The patient was educated that we can provide 3 monthly scripts for their medication, it is their responsibility to follow the instructions.  Side effects or complications from medications: No  Patient is aware that pain medications are meant to minimize the severity of the pain to allow their pain levels to improve to allow for better function. They are aware of that pain medications cannot totally remove their pain.  Due for UDT ( at least once per year) : 11/24/2017 Patient states she does not think she needs  The pain medication . Very nice patient She brings up today having a difficult time taking care of her husband for whom she is separated from She is pretty much a principal caretaker The difficult part though is it is hard to understand for certain what is going on in regards to what the patient says and what her husband says and we did look to ER records which have evaluated the husband situation and did not find been harmful to himself when the last she states she has been threatening and I informed her that if she feels threatened she needs to follow-up report with the police and potentially father report with magistrate      Review of Systems  Constitutional: Negative for activity change and appetite change.  HENT: Negative for congestion and rhinorrhea.   Respiratory: Negative for cough and shortness of breath.   Cardiovascular: Negative for chest pain and leg swelling.    Gastrointestinal: Negative for abdominal pain, nausea and vomiting.  Musculoskeletal: Positive for back pain. Negative for arthralgias.  Skin: Negative for color change.  Neurological: Negative for dizziness and weakness.  Psychiatric/Behavioral: Negative for agitation and confusion.       Objective:   Physical Exam  Constitutional: She appears well-nourished. No distress.  HENT:  Head: Normocephalic and atraumatic.  Eyes: Right eye exhibits no discharge. Left eye exhibits no discharge.  Neck: No tracheal deviation present.  Cardiovascular: Normal rate, regular rhythm and normal heart sounds.  No murmur heard. Pulmonary/Chest: Effort normal and breath sounds normal. No respiratory distress.  Musculoskeletal: She exhibits no edema.  Lymphadenopathy:    She has no cervical adenopathy.  Neurological: She is alert. Coordination normal.  Skin: Skin is warm and dry.  Psychiatric: She has a normal mood and affect. Her behavior is normal.  Vitals reviewed.         Assessment & Plan:  Chronic pain She has chronic pain with back surgery previous She uses hydrocodone Drug registry checked 1 or 2/day Patient states she has plenty at home and does not want a prescription today She states will let us know with a new prescription we will follow her up again in 3 months  Difficult social situation we will discuss this with Adult Protective Services at the very least they can send a Education officer, museum out there to talk with the family to try to figure out what help if any can be  offered once again if she feels threatened she needs to connect with the police she understands this

## 2017-10-04 DIAGNOSIS — H04123 Dry eye syndrome of bilateral lacrimal glands: Secondary | ICD-10-CM | POA: Diagnosis not present

## 2017-10-04 DIAGNOSIS — H1851 Endothelial corneal dystrophy: Secondary | ICD-10-CM | POA: Diagnosis not present

## 2017-10-04 DIAGNOSIS — H2513 Age-related nuclear cataract, bilateral: Secondary | ICD-10-CM | POA: Diagnosis not present

## 2017-10-05 ENCOUNTER — Telehealth: Payer: Self-pay | Admitting: Family Medicine

## 2017-10-05 NOTE — Telephone Encounter (Signed)
The patient had requested that we try to see if there was additional help available to her and her husband Glendell Docker   I did call and speak with social services   please let the patient know that I did speak with social services.  They state that if she feels she needs additional help or if she is feeling like she is having increased difficulty managing Glendell Docker she needs to call social services.  Specifically if she feels there is a safety issue she needs to speak with Adult Protective Services [phone] extension [phone] Carrie freeze

## 2017-10-05 NOTE — Progress Notes (Signed)
Dr Nicki Reaper spoke with adult protective services on 10/05/17

## 2017-10-05 NOTE — Telephone Encounter (Signed)
Left message to return call 

## 2017-10-05 NOTE — Telephone Encounter (Signed)
Provider wanted to speak with APS. Spoke with Corene Cornea at [facility] and gave provider cell number. Corene Cornea stated that someone will be in contact with provider.

## 2017-10-11 DIAGNOSIS — Z8659 Personal history of other mental and behavioral disorders: Secondary | ICD-10-CM | POA: Diagnosis not present

## 2017-10-11 DIAGNOSIS — F319 Bipolar disorder, unspecified: Secondary | ICD-10-CM | POA: Diagnosis not present

## 2017-10-11 DIAGNOSIS — F4310 Post-traumatic stress disorder, unspecified: Secondary | ICD-10-CM | POA: Diagnosis not present

## 2017-10-11 DIAGNOSIS — F411 Generalized anxiety disorder: Secondary | ICD-10-CM | POA: Diagnosis not present

## 2017-10-13 NOTE — Telephone Encounter (Signed)
Left message to return call 

## 2017-10-15 ENCOUNTER — Other Ambulatory Visit (HOSPITAL_COMMUNITY): Payer: Self-pay | Admitting: Orthopedic Surgery

## 2017-10-15 DIAGNOSIS — M1611 Unilateral primary osteoarthritis, right hip: Secondary | ICD-10-CM | POA: Diagnosis not present

## 2017-10-15 DIAGNOSIS — Z96641 Presence of right artificial hip joint: Secondary | ICD-10-CM

## 2017-10-15 DIAGNOSIS — M1711 Unilateral primary osteoarthritis, right knee: Secondary | ICD-10-CM | POA: Diagnosis not present

## 2017-10-18 ENCOUNTER — Telehealth: Payer: Self-pay

## 2017-10-18 ENCOUNTER — Ambulatory Visit: Payer: Medicare Other | Admitting: Family Medicine

## 2017-10-18 NOTE — Telephone Encounter (Signed)
Patient is here today with her husband for his appt with Dr.Scott. She states for the last week she has had mid epigastric pain that is constant she states she has nausea. She has an appt with Dr.Fields this coming Thursday . She reports to have ran a low grade fever three days ago of 99.2 she is no loner running a fever. Also reports the pain had been a lever seven scale of (1-10 of 10 being the most pain)a few days agoShe reports the pain is at a one today.She is not having any vomiting nor diarrhea and she has been taking her hydrocodone to help with the pain. She had thought on Saturday night about going to the ed ,but decided against that. She is aware we have no available appt for today and we can see her tomorrow,if unable to wait till then she will have to go to the hospital.Appt given to pt for tomorrow am.

## 2017-10-19 ENCOUNTER — Ambulatory Visit (HOSPITAL_COMMUNITY): Payer: Medicare Other | Attending: Orthopedic Surgery

## 2017-10-19 ENCOUNTER — Encounter (HOSPITAL_COMMUNITY): Payer: Self-pay

## 2017-10-19 ENCOUNTER — Encounter: Payer: Self-pay | Admitting: Family Medicine

## 2017-10-19 ENCOUNTER — Ambulatory Visit (INDEPENDENT_AMBULATORY_CARE_PROVIDER_SITE_OTHER): Payer: Medicare Other | Admitting: Family Medicine

## 2017-10-19 ENCOUNTER — Other Ambulatory Visit: Payer: Self-pay

## 2017-10-19 VITALS — BP 122/72 | Temp 98.1°F | Ht 62.5 in | Wt 135.0 lb

## 2017-10-19 DIAGNOSIS — R2689 Other abnormalities of gait and mobility: Secondary | ICD-10-CM | POA: Diagnosis not present

## 2017-10-19 DIAGNOSIS — M6281 Muscle weakness (generalized): Secondary | ICD-10-CM

## 2017-10-19 DIAGNOSIS — M25561 Pain in right knee: Secondary | ICD-10-CM | POA: Diagnosis not present

## 2017-10-19 DIAGNOSIS — G8929 Other chronic pain: Secondary | ICD-10-CM | POA: Diagnosis not present

## 2017-10-19 DIAGNOSIS — R1013 Epigastric pain: Secondary | ICD-10-CM

## 2017-10-19 DIAGNOSIS — M25551 Pain in right hip: Secondary | ICD-10-CM

## 2017-10-19 MED ORDER — PANTOPRAZOLE SODIUM 40 MG PO TBEC
40.0000 mg | DELAYED_RELEASE_TABLET | Freq: Every day | ORAL | 2 refills | Status: DC
Start: 2017-10-19 — End: 2018-07-18

## 2017-10-19 MED ORDER — SUCRALFATE 1 GM/10ML PO SUSP
1.0000 g | Freq: Three times a day (TID) | ORAL | 0 refills | Status: DC
Start: 2017-10-19 — End: 2018-07-15

## 2017-10-19 NOTE — Telephone Encounter (Signed)
Pt notified at office visit today and verbally understood. Name of contact person given to patient

## 2017-10-19 NOTE — Therapy (Addendum)
[place], [place], [address] Phone: [phone]   Fax:  [phone]  Physical Therapy Evaluation  Patient Details  Name: Lyah Millirons MRN: [id number] Date of Birth: Mar 22, 1945 Referring Provider: Paralee Cancel, MD   Encounter Date: 10/19/2017  PT End of Session - 10/19/17 1528    Visit Number  1    Number of Visits  13    Date for PT Re-Evaluation  11/30/17    Authorization Type  Medicare Part A/B primary; [employer] Secondary (PT's ONLY, no auth required, no visit limit)    Authorization Time Period  10/19/17 - 12/03/17    Authorization - Visit Number  1    Authorization - Number of Visits  10    PT Start Time  1034    PT Stop Time  1116    PT Time Calculation (min)  42 min    Activity Tolerance  Patient tolerated treatment well    Behavior During Therapy  [facility] for tasks assessed/performed       Past Medical History:  Diagnosis Date  . Anxiety   . Arthritis   . Arthritis   . Asthma   . Bipolar 2 disorder ([place])   . Cancer (HCC)    Basal Cell  . Constipation   . Depression   . Fibromyalgia   . GERD (gastroesophageal reflux disease)   . Heart murmur    As small child   . History of gout   . History of skin cancer   . HNP (herniated nucleus pulposus with myelopathy), thoracic   . Hypothyroidism   . Left eye injury    In ED 06/17/2014   . Low BP   . Migraines   . OCD (obsessive compulsive disorder)   . Osteoporosis   . Psoriasis   . Scoliosis     Past Surgical History:  Procedure Laterality Date  . Arthroscopic left knee surgery    . DILATION AND CURETTAGE OF UTERUS  x3  . HIP SURGERY Right 2015  . iliac wings    . KNEE SURGERY Bilateral 2015, 2016  . MANDIBLE SURGERY     X 2  . PARTIAL KNEE ARTHROPLASTY Left 02/12/2014   Procedure: LEFT KNEE UNICOMPARTMENTAL MEDIALLY ARTHROPLASTY ;  Surgeon: Mauri Pole, MD;  Location: [facility];  Service: Orthopedics;  Laterality: Left;  . PARTIAL  KNEE ARTHROPLASTY Right 07/02/2014   Procedure: RIGHT UNI KNEE ARTHOPLASTY MEDIALLY;  Surgeon: Paralee Cancel, MD;  Location: [facility];  Service: Orthopedics;  Laterality: Right;  . rod in spine     scoliosis   . TONSILLECTOMY    . TOTAL HIP ARTHROPLASTY Right 05/09/2013   Procedure: RIGHT TOTAL HIP ARTHROPLASTY ANTERIOR APPROACH;  Surgeon: Mauri Pole, MD;  Location: [facility];  Service: Orthopedics;  Laterality: Right;  . TUBAL LIGATION    . WRIST SURGERY      There were no vitals filed for this visit.   Subjective Assessment - 10/19/17 1048    Subjective  Patient arrives reporting she has had Rt knee pain and hip pain for several years. She reports a history of Rt lateral unicondylar knee replacement and a Rt THA. She also had a scoliosis spine surgery with rods from T1-S1 which has significantly limited her mobility. She has Rt hip pain that has worsened over the last year and is ~ 6 weeks s/p injection into her Rt knee to reduce pain. She reports it helped with medial Rt knee pain  but now she has more pain laterally where her joint replacement is. She also complains of numbness around her Rt knee following the injection. 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 She reports she is able to care for him and herself but is limited with caring for her animals due to Rt leg pain while squatting which she has to do as her spine surgery will not allow her to flex forward at her trunk.     Pertinent History  T1-S1 scoliosis spine surgery with rods, Rt THA, Rt and Lt lateral unicondylar knee replacement    Limitations  Lifting;Standing;Walking;House hold activities    Diagnostic tests  Plan to have MRI this Friday 10/22/17 for Rt hip = MD concerned there could be gluteus tear    Currently in Pain?  Yes    Pain Score  7    LT is 3-4   Pain Location  Knee    Pain Orientation  Right    Pain Descriptors /  Indicators  Constant;Dull   deep   Pain Type  Chronic pain    Pain Onset  More than a month ago    Pain Frequency  Constant    Aggravating Factors   squatting, standing    Pain Relieving Factors  unsure    Effect of Pain on Daily Activities  moderate limitation         [facility] PT Assessment - 10/19/17 0001      Assessment   Medical Diagnosis  Rt Knee Pain    Referring Provider  Paralee Cancel, MD    Next MD Visit  MRI Friday 10/22/17    Prior Therapy  for spine and joint replacements      Precautions   Precautions  None      Restrictions   Weight Bearing Restrictions  No      Balance Screen   Has the patient fallen in the past 6 months  Yes    How many times?  3    Has the patient had a decrease in activity level because of a fear of falling?   Yes    Is the patient reluctant to leave their home because of a fear of falling?   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, applications  used to walk dogs but can't anymore due to pain      Cognition   Overall Cognitive Status  Within Functional Limits for tasks assessed      Observation/Other Assessments   Focus on Therapeutic Outcomes (FOTO)  perform next session      Functional Tests   Functional tests  Single leg stance;Squat      Squat   Comments  patient with wide BOS, Lt weigth shift, patietn  with "gower's sign" technique to stand up form low squat indicating proximla LE weakness      Single Leg Stance   Comments  Bil LE = 30 seconds with eyes open on solid surface      Posture/Postural Control   Posture/Postural Control  Postural limitations      ROM / Strength   AROM / PROM / Strength  Strength;AROM      AROM   AROM Assessment Site  Knee    Right/Left Knee  Right;Left    Right Knee Flexion  118    Left Knee Flexion  120      Strength   Overall Strength Comments  unable to test hip exetension in prone; patient cannot achieve hip extension past 0     Strength Assessment Site  Hip;Knee;Ankle    Right Hip Flexion  4/5    Right Hip Extension  4-/5   tested sidelying; scored for within her range   Right Hip ABduction  4/5    Left Hip Flexion  4/5    Left Hip Extension  4-/5   tested sidelying; scored for within her range   Left Hip ABduction  4+/5    Right/Left Knee  Right;Left    Right Knee Flexion  4-/5    Right Knee Extension  4/5    Left Knee Flexion  4/5    Left Knee Extension  4+/5    Right Ankle Dorsiflexion  4+/5    Left Ankle Dorsiflexion  4+/5      Flexibility   Soft Tissue Assessment /Muscle Length  yes    Quadriceps  positive thomas test with knee's unable to rest at 90 degrees      Transfers   Five time sit to stand comments   11.2 without UE use      Ambulation/Gait   Ambulation/Gait  Yes    Ambulation/Gait Assistance  7: Independent    Ambulation Distance (Feet)  452 Feet    Assistive device  None    Gait Pattern  Step-through pattern;Lateral trunk lean to right;Decreased stride length    Ambulation Surface  Level;Indoor    Gait velocity  1.14 m/s        Objective measurements completed on examination: See above findings.      [facility] PT Treatment/Exercise - 10/19/17 0001      Exercises   Exercises  Knee/Hip      Knee/Hip Exercises: Stretches   Hip Flexor Stretch  Both;1 rep;30 seconds    Hip Flexor Stretch Limitations   thomas stretch        PT Education - 10/19/17 1802    Education Details  Educated on exam findings and best POC for therapy. Educated on initial exercise for stretching.    Person(s) Educated  Patient    Methods  Explanation;Handout    Comprehension  Verbalized understanding       PT Short Term Goals - 10/19/17 1631      PT SHORT TERM GOAL #1   Title  Patient will be independent with HEP, updated PRN, to improve strength and functional mobility as well as reduce pain.    Time  2    Period  Weeks    Status  New    Target Date  11/02/17  PT SHORT TERM GOAL #2   Title  Patient will improve MMT for limited group by 1/2 grade to demonstrate improved LE strength to reduce    Time  3    Period  Weeks    Status  New    Target Date  11/09/17      PT SHORT TERM GOAL #3   Time  --    Period  --    Status  --        PT Long Term Goals - 10/19/17 1643      PT LONG TERM GOAL #1   Title  Patient will perform 10 squats with normal depth, no early heel rise, and no excessive weight shift to right/equal symmetrical weight bearing to demonstrate improved mechanics and flexibility/ROM.     Time  6    Period  Weeks    Status  New    Target Date  11/30/17      PT LONG TERM GOAL #2   Title  Patient will improve MMT to 5/5 for bil LE throughout all muscle groups to demonstrate improved functional LE strength.     Time  6    Period  Weeks    Status  New      PT LONG TERM GOAL #3   Title  Patient will achieve resting knee flexion of 90 degrees or greater to demonstrate improve hip flexor and quadriceps length to improve flexibility for better mechanics with functional mobility.    Time  6    Period  Weeks    Status  New         Plan - 10/19/17 1530    Clinical Impression Statement  [name] presents for physical therapy evaluation for Rt knee and hip pain. She presents with Bil LE weakness, proximal>distal, impaired posture and body mechanics with functional  mobility, limited muscle flexibility, and decreased activity tolerance. She has tenderness along her lateral right thigh from her greater trochanter to the pes anserine and has a positive Thomas test for quadriceps limitations and is unable to achieve beyond neutral hip extension bilaterally. Resisted Rt hip abduction provokes her pain/symptoms. Additionally she is limited with mobility by prior spine surgery with rods from T1-S1 to correct scoliosis curve. She will benefit from skilled PT interventions to address impairments and improve activity tolerance as well as reduce pain.     Clinical Presentation  Stable    Clinical Presentation due to:  MMT, ROM, thomas test, 2MWT, 5 time sit to stand, clinical judgement    Clinical Decision Making  Low    Rehab Potential  Fair    PT Frequency  2x / week    PT Duration  6 weeks    PT Treatment/Interventions  ADLs/Self Care Home Management;Aquatic Therapy;Cryotherapy;Electrical Stimulation;Iontophoresis 4mg /ml Dexamethasone;Moist Heat;DME Instruction;Gait training;Stair training;Functional mobility training;Therapeutic activities;Therapeutic exercise;Balance training;Neuromuscular re-education;Patient/family education;Manual techniques;Passive range of motion;Dry needling    PT Next Visit Plan  Review eval and goals with patient. Initiate proximal hip strengthening.    PT Home Exercise Plan  Eval: thomas stretch for hip flexor    Consulted and Agree with Plan of Care  Patient       Patient will benefit from skilled therapeutic intervention in order to improve the following deficits and impairments:  Abnormal gait, Decreased activity tolerance, Decreased endurance, Decreased range of motion, Decreased strength, Decreased balance, Decreased mobility, Difficulty walking, Increased muscle spasms, Impaired flexibility, Pain, Improper body mechanics, Postural dysfunction  Visit Diagnosis: Chronic pain of right  knee  Pain in right hip  Muscle weakness  (generalized)  Other abnormalities of gait and mobility     Problem List Patient Active Problem List   Diagnosis Date Noted  . Abdominal pain, epigastric 09/18/2015  . FH: colon cancer 09/18/2015  . Osteoporosis 05/20/2015  . Moderate single current episode of major depressive disorder ([place]) 02/18/2015  . S/P right UKR 07/02/2014  . Status post unilateral knee replacement 07/02/2014  . Asthma, mild intermittent 02/06/2014  . Lung nodule 02/06/2014  . Hyperlipidemia 01/18/2014  . Expected blood loss anemia 05/10/2013  . Overweight (BMI 25.0-29.9) 05/10/2013  . S/P right THA, AA 05/09/2013  . Pre-operative respiratory examination 05/07/2013  . Post-nasal drainage 05/07/2013  . Endometrial polyp 02/28/2013  . Osteoarthritis of right hip 02/15/2013  . Precordial pain 12/07/2012  . Chronic back pain 08/17/2012  . Hypothyroid 08/17/2012  . Osteopenia 08/17/2012  . Hypersomnia 11/18/2011  . Asthma, mild persistent 08/05/2011  . Memory change 08/05/2011  . TIA (transient ischemic attack) 08/05/2011  . Pulmonary nodule 08/05/2011  . SOB (shortness of breath) 01/05/2011    Kipp Brood, PT, DPT Physical Therapist with [facility]  10/19/2017 6:05 PM    [place], [place], [address] Phone: [phone]   Fax:  [phone]  Name: Raina Sole MRN: [id number] Date of Birth: [birth year]

## 2017-10-19 NOTE — Progress Notes (Signed)
   Subjective:    Patient ID: Jenna Contreras, female    DOB: [birth year], 72 y.o.   MRN: [id number]  Abdominal Pain  This is a new problem. The current episode started in the past 7 days. The problem occurs constantly. The problem has been gradually worsening. The pain is located in the epigastric region. The quality of the pain is aching and burning. The abdominal pain radiates to the epigastric region. Associated symptoms include nausea. Pertinent negatives include no fever, flatus, hematochezia, melena or vomiting. The pain is aggravated by movement and eating. She has tried antacids for the symptoms. The treatment provided mild relief.  Patient does take NSAIDs This could be causing her pain She is she denies any changes in her stools no bloody stools.  No bilious vomiting PMH benign    Review of Systems  Constitutional: Negative for activity change and fever.  HENT: Negative for congestion, ear pain and rhinorrhea.   Eyes: Negative for discharge.  Respiratory: Negative for cough, shortness of breath and wheezing.   Cardiovascular: Negative for chest pain.  Gastrointestinal: Positive for abdominal pain and nausea. Negative for flatus, hematochezia, melena and vomiting.       Objective:   Physical Exam  Constitutional: She appears well-developed.  Cardiovascular: Normal rate, regular rhythm and normal heart sounds.  No murmur heard. Pulmonary/Chest: Effort normal and breath sounds normal.  Abdominal: Soft. She exhibits no distension. There is tenderness.  Neurological: She is alert.  Skin: Skin is warm and dry.      Hard to say if this is gastritis or the possibility of a developing ulcer I doubt that it is a bleeding ulcer warning signs were discussed    Assessment & Plan:  Severe epigastric pain Lab work indicated Patient with NSAIDs Stop all NSAIDs Use PPI and Carafate Follow-up on labs See GI later this week Probably will need EGD Follow-up sooner if any  problems

## 2017-10-19 NOTE — Patient Instructions (Signed)
Stop diclofenac  See dr fields on Thursday

## 2017-10-20 ENCOUNTER — Encounter (HOSPITAL_COMMUNITY): Payer: Self-pay

## 2017-10-20 ENCOUNTER — Ambulatory Visit (HOSPITAL_COMMUNITY): Payer: Medicare Other

## 2017-10-20 ENCOUNTER — Other Ambulatory Visit: Payer: Self-pay

## 2017-10-20 DIAGNOSIS — G8929 Other chronic pain: Secondary | ICD-10-CM | POA: Diagnosis not present

## 2017-10-20 DIAGNOSIS — R2689 Other abnormalities of gait and mobility: Secondary | ICD-10-CM | POA: Diagnosis not present

## 2017-10-20 DIAGNOSIS — M6281 Muscle weakness (generalized): Secondary | ICD-10-CM | POA: Diagnosis not present

## 2017-10-20 DIAGNOSIS — M25561 Pain in right knee: Principal | ICD-10-CM

## 2017-10-20 DIAGNOSIS — M25551 Pain in right hip: Secondary | ICD-10-CM | POA: Diagnosis not present

## 2017-10-20 LAB — BASIC METABOLIC PANEL
BUN/Creatinine Ratio: 24 (ref 12–28)
BUN: 19 mg/dL (ref 8–27)
CO2: 18 mmol/L — AB (ref 20–29)
Calcium: 9.5 mg/dL (ref 8.7–10.3)
Chloride: 109 mmol/L — ABNORMAL HIGH (ref 96–106)
Creatinine, Ser: 0.8 mg/dL (ref 0.57–1.00)
GFR calc Af Amer: 85 mL/min/{1.73_m2} (ref >59)
GFR calc non Af Amer: 73 mL/min/{1.73_m2} (ref >59)
GLUCOSE: 104 mg/dL — AB (ref 65–99)
POTASSIUM: 4.8 mmol/L (ref 3.5–5.2)
SODIUM: 143 mmol/L (ref 134–144)

## 2017-10-20 LAB — HEPATIC FUNCTION PANEL
ALK PHOS: 96 IU/L (ref 39–117)
ALT: 12 IU/L (ref 0–32)
AST: 11 IU/L (ref 0–40)
Albumin: 4.1 g/dL (ref 3.5–4.8)
Bilirubin Total: 0.3 mg/dL (ref 0.0–1.2)
Bilirubin, Direct: 0.08 mg/dL (ref 0.00–0.40)
Total Protein: 6.9 g/dL (ref 6.0–8.5)

## 2017-10-20 LAB — CBC WITH DIFFERENTIAL/PLATELET
BASOS: 1 %
Basophils Absolute: 0.1 10*3/uL (ref 0.0–0.2)
EOS (ABSOLUTE): 0.1 10*3/uL (ref 0.0–0.4)
EOS: 2 %
HEMATOCRIT: 37.5 % (ref 34.0–46.6)
Hemoglobin: 12.5 g/dL (ref 11.1–15.9)
Immature Grans (Abs): 0 10*3/uL (ref 0.0–0.1)
Immature Granulocytes: 0 %
LYMPHS ABS: 2.2 10*3/uL (ref 0.7–3.1)
Lymphs: 37 %
MCH: 29.3 pg (ref 26.6–33.0)
MCHC: 33.3 g/dL (ref 31.5–35.7)
MCV: 88 fL (ref 79–97)
MONOS ABS: 0.4 10*3/uL (ref 0.1–0.9)
Monocytes: 6 %
Neutrophils Absolute: 3.2 10*3/uL (ref 1.4–7.0)
Neutrophils: 54 %
Platelets: 316 10*3/uL (ref 150–450)
RBC: 4.26 x10E6/uL (ref 3.77–5.28)
RDW: 14.1 % (ref 12.3–15.4)
WBC: 6 10*3/uL (ref 3.4–10.8)

## 2017-10-20 LAB — LIPASE: Lipase: 15 U/L (ref 14–85)

## 2017-10-20 LAB — AMYLASE: AMYLASE: 47 U/L (ref 31–124)

## 2017-10-20 NOTE — Therapy (Signed)
[place], [place], [address] Phone: [phone]   Fax:  [phone]  Physical Therapy Treatment  Patient Details  Name: Jenna Contreras MRN: [id number] Date of Birth: Oct 13, 1945 Referring Provider: Paralee Cancel, MD   Encounter Date: 10/20/2017  PT End of Session - 10/20/17 0842    Visit Number  2    Number of Visits  13    Date for PT Re-Evaluation  11/30/17    Authorization Type  Medicare Part A/B primary; [employer] Secondary (PT's ONLY, no auth required, no visit limit)    Authorization Time Period  10/19/17 - 12/03/17    Authorization - Visit Number  2    Authorization - Number of Visits  10    PT Start Time  0825   pt arrived late   PT Stop Time  0905    PT Time Calculation (min)  40 min    Activity Tolerance  Patient tolerated treatment well    Behavior During Therapy  [facility] for tasks assessed/performed       Past Medical History:  Diagnosis Date  . Anxiety   . Arthritis   . Arthritis   . Asthma   . Bipolar 2 disorder ([place])   . Cancer (HCC)    Basal Cell  . Constipation   . Depression   . Fibromyalgia   . GERD (gastroesophageal reflux disease)   . Heart murmur    As small child   . History of gout   . History of skin cancer   . HNP (herniated nucleus pulposus with myelopathy), thoracic   . Hypothyroidism   . Left eye injury    In ED 06/17/2014   . Low BP   . Migraines   . OCD (obsessive compulsive disorder)   . Osteoporosis   . Psoriasis   . Scoliosis     Past Surgical History:  Procedure Laterality Date  . Arthroscopic left knee surgery    . DILATION AND CURETTAGE OF UTERUS  x3  . HIP SURGERY Right 2015  . iliac wings    . KNEE SURGERY Bilateral 2015, 2016  . MANDIBLE SURGERY     X 2  . PARTIAL KNEE ARTHROPLASTY Left 02/12/2014   Procedure: LEFT KNEE UNICOMPARTMENTAL MEDIALLY ARTHROPLASTY ;  Surgeon: Mauri Pole, MD;  Location: [facility];  Service: Orthopedics;  Laterality:  Left;  . PARTIAL KNEE ARTHROPLASTY Right 07/02/2014   Procedure: RIGHT UNI KNEE ARTHOPLASTY MEDIALLY;  Surgeon: Paralee Cancel, MD;  Location: [facility];  Service: Orthopedics;  Laterality: Right;  . rod in spine     scoliosis   . TONSILLECTOMY    . TOTAL HIP ARTHROPLASTY Right 05/09/2013   Procedure: RIGHT TOTAL HIP ARTHROPLASTY ANTERIOR APPROACH;  Surgeon: Mauri Pole, MD;  Location: [facility];  Service: Orthopedics;  Laterality: Right;  . TUBAL LIGATION    . WRIST SURGERY      There were no vitals filed for this visit.  Subjective Assessment - 10/20/17 0830    Subjective  Patient arrives reporting her Rt hip is sore from all of the testing yesterday. She reports she tried the stretch for her HEP last night and had no difficulty. She does not feel as much of a stretch on her Lt LE compared to the Rt.     Pertinent History  T1-S1 scoliosis spine surgery with rods, Rt THA, Rt and Lt lateral unicondylar knee replacement    Limitations  Lifting;Standing;Walking;House hold activities  Diagnostic tests  Plan to have MRI this Friday 10/22/17 for Rt hip = MD concerned there could be gluteus tear    Currently in Pain?  Yes    Pain Score  9     Pain Location  Hip    Pain Orientation  Right    Pain Descriptors / Indicators  Stabbing   deep   Pain Type  Chronic pain   has had pain for 3 months   Pain Onset  More than a month ago    Pain Frequency  Constant    Aggravating Factors   squatting    Pain Relieving Factors  unsure        [facility] PT Assessment - 10/20/17 0001      Observation/Other Assessments   Focus on Therapeutic Outcomes (FOTO)   67% limited      AROM   Right Knee Flexion  118    Left Knee Flexion  126       [facility] Adult PT Treatment/Exercise - 10/20/17 0845      Exercises   Exercises  Knee/Hip      Knee/Hip Exercises: Stretches   Active Hamstring Stretch  Right;Left;2 reps;30 seconds    Active Hamstring Stretch Limitations  seated       Knee/Hip Exercises: Supine    Bridges  Strengthening;Both;2 sets;10 reps    Straight Leg Raises  Strengthening;Both;2 sets;10 reps    Other Supine Knee/Hip Exercises  Ab set with bent knee raise, 2x 10 reps      Knee/Hip Exercises: Sidelying   Clams  2x 10 reps, Rt LE; no theraband        PT Education - 10/20/17 0903    Education Details  Reviewed evaluation and goals wtih patient. Edcuated on exercise throughout and reviewed duty free agenecies and unsafe home resources. Educated on hip anatomy and muscle attatchments at start of session to address patient's questions.    Person(s) Educated  Patient    Methods  Explanation;Handout    Comprehension  Verbalized understanding;Returned demonstration       PT Short Term Goals - 10/20/17 0911      PT SHORT TERM GOAL #1   Title  Patient will be independent with HEP, updated PRN, to improve strength and functional mobility as well as reduce pain.    Time  2    Period  Weeks    Status  On-going      PT SHORT TERM GOAL #2   Title  Patient will improve MMT for limited group by 1/2 grade to demonstrate improved LE strength to reduce    Time  3    Period  Weeks    Status  On-going        PT Long Term Goals - 10/20/17 4081      PT LONG TERM GOAL #1   Title  Patient will perform 10 squats with normal depth, no early heel rise, and no excessive weight shift to right/equal symmetrical weight bearing to demonstrate improved mechanics and flexibility/ROM.     Time  6    Period  Weeks    Status  On-going      PT LONG TERM GOAL #2   Title  Patient will improve MMT to 5/5 for bil LE throughout all muscle groups to demonstrate improved functional LE strength.     Time  6    Period  Weeks    Status  On-going      PT LONG TERM GOAL #3  Title  Patient will achieve resting knee flexion of 90 degrees or greater to demonstrate improve hip flexor and quadriceps length to improve flexibility for better mechanics with functional mobility.    Time  6    Period  Weeks     Status  On-going       Plan - 10/20/17 0848    Clinical Impression Statement  Initiated proximal LE strengthening this session to address weakness in bil LE. Performed FOTO and assessed knee ROM again. Patient had questions regarding hip muscle attachments and anatomy, questions addressed at start of session with anatomical diagram from visual aid. Patient reported tightness in Rt hip at EOS but denied an increase in pain. HEP updated with lumbar stabilization exercise to improve core stability in preparation to progress towards functional movement training. She will continue to benefit from skilled PT interventions to address impairments and progress flexibility and strength.    Rehab Potential  Fair    PT Frequency  2x / week    PT Duration  6 weeks    PT Treatment/Interventions  ADLs/Self Care Home Management;Aquatic Therapy;Cryotherapy;Electrical Stimulation;Iontophoresis 4mg /ml Dexamethasone;Moist Heat;DME Instruction;Gait training;Stair training;Functional mobility training;Therapeutic activities;Therapeutic exercise;Balance training;Neuromuscular re-education;Patient/family education;Manual techniques;Passive range of motion;Dry needling    PT Next Visit Plan  Continue hip strengthening and perform soft tissue mobilization to lateral right thigh PRN and joint mobility to Rt knee for pain reduction.     PT Home Exercise Plan  Eval: thomas stretch for hip flexor    Consulted and Agree with Plan of Care  Patient       Patient will benefit from skilled therapeutic intervention in order to improve the following deficits and impairments:  Abnormal gait, Decreased activity tolerance, Decreased endurance, Decreased range of motion, Decreased strength, Decreased balance, Decreased mobility, Difficulty walking, Increased muscle spasms, Impaired flexibility, Pain, Improper body mechanics, Postural dysfunction  Visit Diagnosis: Chronic pain of right knee  Pain in right hip  Muscle weakness  (generalized)  Other abnormalities of gait and mobility     Problem List Patient Active Problem List   Diagnosis Date Noted  . Abdominal pain, epigastric 09/18/2015  . FH: colon cancer 09/18/2015  . Osteoporosis 05/20/2015  . Moderate single current episode of major depressive disorder ([place]) 02/18/2015  . S/P right UKR 07/02/2014  . Status post unilateral knee replacement 07/02/2014  . Asthma, mild intermittent 02/06/2014  . Lung nodule 02/06/2014  . Hyperlipidemia 01/18/2014  . Expected blood loss anemia 05/10/2013  . Overweight (BMI 25.0-29.9) 05/10/2013  . S/P right THA, AA 05/09/2013  . Pre-operative respiratory examination 05/07/2013  . Post-nasal drainage 05/07/2013  . Endometrial polyp 02/28/2013  . Osteoarthritis of right hip 02/15/2013  . Precordial pain 12/07/2012  . Chronic back pain 08/17/2012  . Hypothyroid 08/17/2012  . Osteopenia 08/17/2012  . Hypersomnia 11/18/2011  . Asthma, mild persistent 08/05/2011  . Memory change 08/05/2011  . TIA (transient ischemic attack) 08/05/2011  . Pulmonary nodule 08/05/2011  . SOB (shortness of breath) 01/05/2011    Kipp Brood, PT, DPT Physical Therapist with [facility]  10/20/2017 9:12 AM    [place], [place], [address] Phone: [phone]   Fax:  [phone]  Name: Jenna Contreras MRN: [id number] Date of Birth: 01-16-1946

## 2017-10-20 NOTE — Patient Instructions (Signed)
  Exercises  Supine March - 10 reps - 2-3 sets - 1x daily - 7x weekly

## 2017-10-21 ENCOUNTER — Encounter: Payer: Self-pay | Admitting: Gastroenterology

## 2017-10-21 ENCOUNTER — Encounter: Payer: Self-pay | Admitting: *Deleted

## 2017-10-21 ENCOUNTER — Ambulatory Visit (HOSPITAL_COMMUNITY): Payer: Medicare Other

## 2017-10-21 ENCOUNTER — Other Ambulatory Visit: Payer: Self-pay | Admitting: *Deleted

## 2017-10-21 ENCOUNTER — Ambulatory Visit (INDEPENDENT_AMBULATORY_CARE_PROVIDER_SITE_OTHER): Payer: Medicare Other | Admitting: Gastroenterology

## 2017-10-21 DIAGNOSIS — R1013 Epigastric pain: Secondary | ICD-10-CM | POA: Diagnosis not present

## 2017-10-21 NOTE — Assessment & Plan Note (Addendum)
SYMPTOMS NOT CONTROLLED DID NTO COMPLETE EGD/TCS IN 2017. DIFFERENTIAL DIAGNOSIS INCLUDES: H PYLORI GASTRITIS, LESS LIKELY CELIAC SPRUE, PUD, OR MICROSCOPIC COLITIS.  DRINK WATER TO KEEP YOUR URINE LIGHT YELLOW. FOLLOW A LOW FAT DIET. MEATS SHOULD BE BAKED, BROILED, OR BOILED. Avoid fried foods.  HANDOUT GIVEN. CONTINUE PROTONIX. TAKE 30 MINUTES PRIOR TO BREAKFAST. USE CARAFATE IF NEEDED TO CONTROL YOUR PAIN. COMPLETE UPPER ENDOSCOPY W/ MAC WITHIN 7 DAYS TO BIOPSY SMALL BOWEL AND STOMACH.DISCUSSED PROCEDURE, BENEFITS, & RISKS: < 1% chance of medication reaction, bleeding, OR perforation. HOLD OFF ON TCS UNTIL AFTER EGD COMPLETE. FOLLOW UP IN 4 MOS.

## 2017-10-21 NOTE — Progress Notes (Signed)
ON RECALL  °

## 2017-10-21 NOTE — Progress Notes (Signed)
cc'ed to pcp °

## 2017-10-21 NOTE — Progress Notes (Signed)
Subjective:    Patient ID: Jenna Contreras, female    DOB: 06/17/1945, 72 y.o.   MRN: [id number]  HPI HAVING EPIGASTRIC FOR AT LEASTS 2 YEARS. FLARE FOR PAST 2 WEEKS. STARTED TUES STABBING IN RUQ/EPIGASTRIUM. IF ATE MADE IT WORSE. TAKES VOLTAREN. STOPPED VOLTAREN ABOUT 9 DAYS. NAUSEATED THE WHOLE TIME NEVER VOMITED. DIFFERENT THAN 2 YRS IT WAS WORSE BUT THIS IS BAD. PAIN GOT WORSE IMMEDIATELY. BMs: EVERY COUPLE DAYS, USUAL, #6, OR #5, CAN HAVE A #4. DIDN'T NOTICE PAIN GOT BETTER WITH BMs. TAKING VOLTAREN WITHOUT PROTONIX FOR OVER A YEAR. NOW TAKING IT PROTONIX ONCE A DAY ALONG WITH CARAFATE. PAIN A LITTLE BETTER TODAY THAN IT WAS FOUR DAYS AGO.   PT DENIES FEVER, CHILLS, HEMATOCHEZIA, HEMATEMESIS, nausea, vomiting, melena, diarrhea, CHEST PAIN, SHORTNESS OF BREATH, CHANGE IN BOWEL IN HABITS, constipation, problems swallowing, problems with sedation, OR heartburn or indigestion.  Past Medical History:  Diagnosis Date  . Anxiety   . Arthritis   . Arthritis   . Asthma   . Bipolar 2 disorder ([place])   . Cancer (HCC)    Basal Cell  . Constipation   . Depression   . Fibromyalgia   . GERD (gastroesophageal reflux disease)   . Heart murmur    As small child   . History of gout   . History of skin cancer   . HNP (herniated nucleus pulposus with myelopathy), thoracic   . Hypothyroidism   . Left eye injury    In ED 06/17/2014   . Low BP   . Migraines   . OCD (obsessive compulsive disorder)   . Osteoporosis   . Psoriasis   . Scoliosis     Past Surgical History:  Procedure Laterality Date  . Arthroscopic left knee surgery    . DILATION AND CURETTAGE OF UTERUS  x3  . HIP SURGERY Right 2015  . iliac wings    . KNEE SURGERY Bilateral 2015, 2016  . MANDIBLE SURGERY     X 2  . PARTIAL KNEE ARTHROPLASTY Left 02/12/2014   Procedure: LEFT KNEE UNICOMPARTMENTAL MEDIALLY ARTHROPLASTY ;  Surgeon: Mauri Pole, MD;  Location: [facility];  Service: Orthopedics;  Laterality: Left;  .  PARTIAL KNEE ARTHROPLASTY Right 07/02/2014   Procedure: RIGHT UNI KNEE ARTHOPLASTY MEDIALLY;  Surgeon: Paralee Cancel, MD;  Location: [facility];  Service: Orthopedics;  Laterality: Right;  . rod in spine     scoliosis   . TONSILLECTOMY    . TOTAL HIP ARTHROPLASTY Right 05/09/2013   Procedure: RIGHT TOTAL HIP ARTHROPLASTY ANTERIOR APPROACH;  Surgeon: Mauri Pole, MD;  Location: [facility];  Service: Orthopedics;  Laterality: Right;  . TUBAL LIGATION    . WRIST SURGERY      Allergies  Allergen Reactions  . Prednisone Other (See Comments)    Suicidal ideation  . Adhesive [Tape]     Pulls her skin off  . Betadine [Povidone Iodine] Other (See Comments)    Sets her on fire  . Demerol [Meperidine] Nausea And Vomiting  . Latex Other (See Comments)    Irritates her skin  . Levaquin [Levofloxacin In D5w]     Painful joints   . Silvadene [Silver Sulfadiazine]   . Trazodone And Nefazodone     Bad dreams  . Ampicillin Rash  . Cephalexin Itching and Rash  . Doxycycline Itching and Rash  . Erythromycin Rash  . Keflex [Cephalexin] Rash  . Penicillins Itching and Rash    Has patient had a  PCN reaction causing immediate rash, facial/tongue/throat swelling, SOB or lightheadedness with hypotension: no Has patient had a PCN reaction causing severe rash involving mucus membranes or skin necrosis: no Has patient had a PCN reaction that required hospitalization: no Has patient had a PCN reaction occurring within the last 10 years: no If all of the above answers are "NO", then may proceed with Cephalosporin use.   . Tetracyclines & Related Itching and Rash    Current Outpatient Medications  Medication Sig    . chlorpheniramine (CHLOR-TRIMETON) 4 MG tablet Take 4 mg by mouth 2 (two) times daily as needed for allergies. Reported on 05/20/2015    . Cholecalciferol (VITAMIN D) 2000 UNITS CAPS Take 1 capsule by mouth 2 (two) times daily. Reported on 09/18/2015    . escitalopram (LEXAPRO) 10 MG tablet Take 1  tablet (10 mg total) by mouth daily.    Marland Kitchen HYDROcodone-acetaminophen (NORCO) 10-325 MG tablet Take one tablet up to 3 times daily.    Marland Kitchen lamoTRIgine (LAMICTAL) 100 MG tablet Take 1 tablet (100 mg total) by mouth 2 (two) times daily.    Marland Kitchen levothyroxine (SYNTHROID, LEVOTHROID) 88 MCG tablet Take 1 tablet (88 mcg total) by mouth daily before breakfast.    . LORazepam (ATIVAN) 0.5 MG tablet TAKE 1 TABLET BY MOUTH TWICE A DAY AS NEEDED FOR ANXIETY    . ondansetron (ZOFRAN) 4 MG tablet Take 1 tablet (4 mg total) by mouth every 6 (six) hours. (Patient taking differently: Take 4 mg by mouth as needed. )    . pantoprazole (PROTONIX) 40 MG tablet Take 1 tablet (40 mg total) by mouth daily.    Marland Kitchen PROAIR HFA 108 (90 Base) MCG/ACT inhaler USE 2 INHALATIONS EVERY 4 HOURS AS NEEDED FOR WHEEZING OR SHORTNESS OF BREATH    . rizatriptan (MAXALT) 10 MG tablet TAKE 1 TABLET ONCE AS NEEDED FOR MIGRAINE. MAY REPEAT IN 2 HOURS IF NEEDED, NO GREATER THAN 4 TABLETS PER WEEK (MUST LAST 90 DAYS)    . sucralfate (CARAFATE) 1 GM/10ML suspension Take 10 mLs (1 g total) by mouth 4 (four) times daily -  with meals and at bedtime.    .      .       Review of Systems PER HPI OTHERWISE ALL SYSTEMS ARE NEGATIVE.    Objective:   Physical Exam  Constitutional: She is oriented to person, place, and time. She appears well-developed and well-nourished. No distress.  HENT:  Head: Normocephalic and atraumatic.  Mouth/Throat: Oropharynx is clear and moist. No oropharyngeal exudate.  Eyes: Pupils are equal, round, and reactive to light. No scleral icterus.  Neck: Normal range of motion. Neck supple.  Cardiovascular: Normal rate, regular rhythm and normal heart sounds.  Pulmonary/Chest: Effort normal and breath sounds normal. No respiratory distress.  Abdominal: Soft. Bowel sounds are normal. She exhibits no distension. There is no tenderness.  Musculoskeletal: She exhibits no edema.  Lymphadenopathy:    She has no cervical adenopathy.   Neurological: She is alert and oriented to person, place, and time.  NO  NEW FOCAL DEFICITS  Psychiatric: She has a normal mood and affect.  Vitals reviewed.     Assessment & Plan:

## 2017-10-21 NOTE — Patient Instructions (Signed)
DRINK WATER TO KEEP YOUR URINE LIGHT YELLOW.  FOLLOW A LOW FAT DIET. MEATS SHOULD BE BAKED, BROILED, OR BOILED. Avoid fried foods. SEE INFO BELOW.   CONTINUE PROTONIX. TAKE 30 MINUTES PRIOR TO BREAKFAST.  USE CARAFATE IF NEEDED TO CONTROL YOUR PAIN.   COMPLETE UPPER ENDOSCOPY WITHIN 7 DAYS.  FOLLOW UP IN 4 MOS.   Low-Fat Diet BREADS, CEREALS, PASTA, RICE, DRIED PEAS, AND BEANS These products are high in carbohydrates and most are low in fat. Therefore, they can be increased in the diet as substitutes for fatty foods. They too, however, contain calories and should not be eaten in excess. Cereals can be eaten for snacks as well as for breakfast.  Include foods that contain fiber (fruits, vegetables, whole grains, and legumes). Research shows that fiber may lower blood cholesterol levels, especially the water-soluble fiber found in fruits, vegetables, oat products, and legumes. FRUITS AND VEGETABLES It is good to eat fruits and vegetables. Besides being sources of fiber, both are rich in vitamins and some minerals. They help you get the daily allowances of these nutrients. Fruits and vegetables can be used for snacks and desserts. MEATS Limit lean meat, chicken, [place], and fish to no more than 6 ounces per day. Beef, Pork, and Lamb Use lean cuts of beef, pork, and lamb. Lean cuts include:  Extra-lean ground beef.  Arm roast.  Sirloin tip.  Center-cut ham.  Round steak.  Loin chops.  Rump roast.  Tenderloin.  Trim all fat off the outside of meats before cooking. It is not necessary to severely decrease the intake of red meat, but lean choices should be made. Lean meat is rich in protein and contains a highly absorbable form of iron. Premenopausal women, in particular, should avoid reducing lean red meat because this could increase the risk for low red blood cells (iron-deficiency anemia).  Chicken and [place] These are good sources of protein. The fat of poultry can be reduced by  removing the skin and underlying fat layers before cooking. Chicken and [place] can be substituted for lean red meat in the diet. Poultry should not be fried or covered with high-fat sauces. Fish and Shellfish Fish is a good source of protein. Shellfish contain cholesterol, but they usually are low in saturated fatty acids. The preparation of fish is important. Like chicken and [place], they should not be fried or covered with high-fat sauces. EGGS Egg whites contain no fat or cholesterol. They can be eaten often. Try 1 to 2 egg whites instead of whole eggs in recipes or use egg substitutes that do not contain yolk.  MILK AND DAIRY PRODUCTS Use skim or 1% milk instead of 2% or whole milk. Decrease whole milk, natural, and processed cheeses. Use nonfat or low-fat (2%) cottage cheese or low-fat cheeses made from vegetable oils. Choose nonfat or low-fat (1 to 2%) yogurt. Experiment with evaporated skim milk in recipes that call for heavy cream. Substitute low-fat yogurt or low-fat cottage cheese for sour cream in dips and salad dressings. Have at least 2 servings of low-fat dairy products, such as 2 glasses of skim (or 1%) milk each day to help get your daily calcium intake.  FATS AND OILS Butterfat, lard, and beef fats are high in saturated fat and cholesterol. These should be avoided.Vegetable fats do not contain cholesterol. AVOID coconut oil, palm oil, and palm kernel oil, WHICH are very high in saturated fats. These should be limited. These fats are often used in [employer], processed foods, popcorn,  oils, and nondairy creamers. Vegetable shortenings and some peanut butters contain hydrogenated oils, which are also saturated fats. Read the labels on these foods and check for saturated vegetable oils.  Desirable liquid vegetable oils are corn oil, cottonseed oil, olive oil, canola oil, safflower oil, soybean oil, and sunflower oil. Peanut oil is not as good, but small amounts are acceptable. Buy a  heart-healthy tub margarine that has no partially hydrogenated oils in the ingredients. AVOID Mayonnaise and salad dressings often are made from unsaturated fats.  OTHER EATING TIPS Snacks  Most sweets should be limited as snacks. They tend to be rich in calories and fats, and their caloric content outweighs their nutritional value. Some good choices in snacks are graham crackers, melba toast, soda crackers, bagels (no egg), English muffins, fruits, and vegetables. These snacks are preferable to snack crackers, [place] fries, and chips. Popcorn should be air-popped or cooked in small amounts of liquid vegetable oil.  Desserts Eat fruit, low-fat yogurt, and fruit ices instead of pastries, cake, and cookies. Sherbet, angel food cake, gelatin dessert, frozen low-fat yogurt, or other frozen products that do not contain saturated fat (pure fruit juice bars, frozen ice pops) are also acceptable.   COOKING METHODS Choose those methods that use little or no fat. They include: Poaching.  Braising.  Steaming.  Grilling.  Baking.  Stir-frying.  Broiling.  Microwaving.  Foods can be cooked in a nonstick pan without added fat, or use a nonfat cooking spray in regular cookware. Limit fried foods and avoid frying in saturated fat. Add moisture to lean meats by using water, broth, cooking wines, and other nonfat or low-fat sauces along with the cooking methods mentioned above. Soups and stews should be chilled after cooking. The fat that forms on top after a few hours in the refrigerator should be skimmed off. When preparing meals, avoid using excess salt. Salt can contribute to raising blood pressure in some people.  EATING AWAY FROM HOME Order entres, potatoes, and vegetables without sauces or butter. When meat exceeds the size of a deck of cards (3 to 4 ounces), the rest can be taken home for another meal. Choose vegetable or fruit salads and ask for low-calorie salad dressings to be served on the side.  Use dressings sparingly. Limit high-fat toppings, such as bacon, crumbled eggs, cheese, sunflower seeds, and olives. Ask for heart-healthy tub margarine instead of butter.

## 2017-10-21 NOTE — H&P (View-Only) (Signed)
Subjective:    Patient ID: Jenna Contreras, female    DOB: [birth year], 72 y.o.   MRN: [id number]  HPI HAVING EPIGASTRIC FOR AT LEASTS 2 YEARS. FLARE FOR PAST 2 WEEKS. STARTED TUES STABBING IN RUQ/EPIGASTRIUM. IF ATE MADE IT WORSE. TAKES VOLTAREN. STOPPED VOLTAREN ABOUT 9 DAYS. NAUSEATED THE WHOLE TIME NEVER VOMITED. DIFFERENT THAN 2 YRS IT WAS WORSE BUT THIS IS BAD. PAIN GOT WORSE IMMEDIATELY. BMs: EVERY COUPLE DAYS, USUAL, #6, OR #5, CAN HAVE A #4. DIDN'T NOTICE PAIN GOT BETTER WITH BMs. TAKING VOLTAREN WITHOUT PROTONIX FOR OVER A YEAR. NOW TAKING IT PROTONIX ONCE A DAY ALONG WITH CARAFATE. PAIN A LITTLE BETTER TODAY THAN IT WAS FOUR DAYS AGO.   PT DENIES FEVER, CHILLS, HEMATOCHEZIA, HEMATEMESIS, nausea, vomiting, melena, diarrhea, CHEST PAIN, SHORTNESS OF BREATH, CHANGE IN BOWEL IN HABITS, constipation, problems swallowing, problems with sedation, OR heartburn or indigestion.  Past Medical History:  Diagnosis Date  . Anxiety   . Arthritis   . Arthritis   . Asthma   . Bipolar 2 disorder ([place])   . Cancer (HCC)    Basal Cell  . Constipation   . Depression   . Fibromyalgia   . GERD (gastroesophageal reflux disease)   . Heart murmur    As small child   . History of gout   . History of skin cancer   . HNP (herniated nucleus pulposus with myelopathy), thoracic   . Hypothyroidism   . Left eye injury    In ED 06/17/2014   . Low BP   . Migraines   . OCD (obsessive compulsive disorder)   . Osteoporosis   . Psoriasis   . Scoliosis     Past Surgical History:  Procedure Laterality Date  . Arthroscopic left knee surgery    . DILATION AND CURETTAGE OF UTERUS  x3  . HIP SURGERY Right 2015  . iliac wings    . KNEE SURGERY Bilateral 2015, 2016  . MANDIBLE SURGERY     X 2  . PARTIAL KNEE ARTHROPLASTY Left 02/12/2014   Procedure: LEFT KNEE UNICOMPARTMENTAL MEDIALLY ARTHROPLASTY ;  Surgeon: Mauri Pole, MD;  Location: [facility];  Service: Orthopedics;  Laterality: Left;  .  PARTIAL KNEE ARTHROPLASTY Right 07/02/2014   Procedure: RIGHT UNI KNEE ARTHOPLASTY MEDIALLY;  Surgeon: Paralee Cancel, MD;  Location: [facility];  Service: Orthopedics;  Laterality: Right;  . rod in spine     scoliosis   . TONSILLECTOMY    . TOTAL HIP ARTHROPLASTY Right 05/09/2013   Procedure: RIGHT TOTAL HIP ARTHROPLASTY ANTERIOR APPROACH;  Surgeon: Mauri Pole, MD;  Location: [facility];  Service: Orthopedics;  Laterality: Right;  . TUBAL LIGATION    . WRIST SURGERY      Allergies  Allergen Reactions  . Prednisone Other (See Comments)    Suicidal ideation  . Adhesive [Tape]     Pulls her skin off  . Betadine [Povidone Iodine] Other (See Comments)    Sets her on fire  . Demerol [Meperidine] Nausea And Vomiting  . Latex Other (See Comments)    Irritates her skin  . Levaquin [Levofloxacin In D5w]     Painful joints   . Silvadene [Silver Sulfadiazine]   . Trazodone And Nefazodone     Bad dreams  . Ampicillin Rash  . Cephalexin Itching and Rash  . Doxycycline Itching and Rash  . Erythromycin Rash  . Keflex [Cephalexin] Rash  . Penicillins Itching and Rash    Has patient had a  PCN reaction causing immediate rash, facial/tongue/throat swelling, SOB or lightheadedness with hypotension: no Has patient had a PCN reaction causing severe rash involving mucus membranes or skin necrosis: no Has patient had a PCN reaction that required hospitalization: no Has patient had a PCN reaction occurring within the last 10 years: no If all of the above answers are "NO", then may proceed with Cephalosporin use.   . Tetracyclines & Related Itching and Rash    Current Outpatient Medications  Medication Sig    . chlorpheniramine (CHLOR-TRIMETON) 4 MG tablet Take 4 mg by mouth 2 (two) times daily as needed for allergies. Reported on 05/20/2015    . Cholecalciferol (VITAMIN D) 2000 UNITS CAPS Take 1 capsule by mouth 2 (two) times daily. Reported on 09/18/2015    . escitalopram (LEXAPRO) 10 MG tablet Take 1  tablet (10 mg total) by mouth daily.    Marland Kitchen HYDROcodone-acetaminophen (NORCO) 10-325 MG tablet Take one tablet up to 3 times daily.    Marland Kitchen lamoTRIgine (LAMICTAL) 100 MG tablet Take 1 tablet (100 mg total) by mouth 2 (two) times daily.    Marland Kitchen levothyroxine (SYNTHROID, LEVOTHROID) 88 MCG tablet Take 1 tablet (88 mcg total) by mouth daily before breakfast.    . LORazepam (ATIVAN) 0.5 MG tablet TAKE 1 TABLET BY MOUTH TWICE A DAY AS NEEDED FOR ANXIETY    . ondansetron (ZOFRAN) 4 MG tablet Take 1 tablet (4 mg total) by mouth every 6 (six) hours. (Patient taking differently: Take 4 mg by mouth as needed. )    . pantoprazole (PROTONIX) 40 MG tablet Take 1 tablet (40 mg total) by mouth daily.    Marland Kitchen PROAIR HFA 108 (90 Base) MCG/ACT inhaler USE 2 INHALATIONS EVERY 4 HOURS AS NEEDED FOR WHEEZING OR SHORTNESS OF BREATH    . rizatriptan (MAXALT) 10 MG tablet TAKE 1 TABLET ONCE AS NEEDED FOR MIGRAINE. MAY REPEAT IN 2 HOURS IF NEEDED, NO GREATER THAN 4 TABLETS PER WEEK (MUST LAST 90 DAYS)    . sucralfate (CARAFATE) 1 GM/10ML suspension Take 10 mLs (1 g total) by mouth 4 (four) times daily -  with meals and at bedtime.    .      .       Review of Systems PER HPI OTHERWISE ALL SYSTEMS ARE NEGATIVE.    Objective:   Physical Exam  Constitutional: She is oriented to person, place, and time. She appears well-developed and well-nourished. No distress.  HENT:  Head: Normocephalic and atraumatic.  Mouth/Throat: Oropharynx is clear and moist. No oropharyngeal exudate.  Eyes: Pupils are equal, round, and reactive to light. No scleral icterus.  Neck: Normal range of motion. Neck supple.  Cardiovascular: Normal rate, regular rhythm and normal heart sounds.  Pulmonary/Chest: Effort normal and breath sounds normal. No respiratory distress.  Abdominal: Soft. Bowel sounds are normal. She exhibits no distension. There is no tenderness.  Musculoskeletal: She exhibits no edema.  Lymphadenopathy:    She has no cervical adenopathy.   Neurological: She is alert and oriented to person, place, and time.  NO  NEW FOCAL DEFICITS  Psychiatric: She has a normal mood and affect.  Vitals reviewed.     Assessment & Plan:

## 2017-10-22 ENCOUNTER — Ambulatory Visit (HOSPITAL_COMMUNITY)
Admission: RE | Admit: 2017-10-22 | Discharge: 2017-10-22 | Disposition: A | Discharge status: Home / Self Care | Payer: Medicare Other | Source: Ambulatory Visit | Attending: Orthopedic Surgery | Admitting: Orthopedic Surgery

## 2017-10-22 DIAGNOSIS — Z471 Aftercare following joint replacement surgery: Secondary | ICD-10-CM | POA: Diagnosis not present

## 2017-10-22 DIAGNOSIS — Z96641 Presence of right artificial hip joint: Secondary | ICD-10-CM | POA: Diagnosis not present

## 2017-10-22 DIAGNOSIS — Z981 Arthrodesis status: Secondary | ICD-10-CM | POA: Insufficient documentation

## 2017-10-22 NOTE — Patient Instructions (Signed)
Jenna Contreras  10/22/2017     @PREFPERIOPPHARMACY @   Your procedure is scheduled on  10/28/2017   Report to [facility] at  645   A.M.  Call this number if you have problems the morning of surgery:  [phone]   Remember:  Do not eat or drink after midnight.  You may drink clear liquids until  ( follow the instructions given to you) .  Clear liquids allowed are:                    Water, Juice (non-citric and without pulp), Carbonated beverages, Clear Tea, Black Coffee only, Plain Jell-O only, Gatorade and Plain Popsicles only    Take these medicines the morning of surgery with A SIP OF WATER  Hydrocodone( if needed), levothyroxine, claritin, ativan ( if needed), zofran ( if needed), protonix, maxalt. Use your inhaler before you come.    Do not wear jewelry, make-up or nail polish.  Do not wear lotions, powders, or perfumes, or deodorant.  Do not shave 48 hours prior to surgery.  Men may shave face and neck.  Do not bring valuables to the hospital.  [facility] is not responsible for any belongings or valuables.  Contacts, dentures or bridgework may not be worn into surgery.  Leave your suitcase in the car.  After surgery it may be brought to your room.  For patients admitted to the hospital, discharge time will be determined by your treatment team.  Patients discharged the day of surgery will not be allowed to drive home.   Name and phone number of your driver:   family Special instructions:   Follow the diet and prep instructions given to you by Dr Nona Dell office.   Please read over the following fact sheets that you were given. Anesthesia Post-op Instructions and Care and Recovery After Surgery       Esophagogastroduodenoscopy Esophagogastroduodenoscopy (EGD) is a procedure to examine the lining of the esophagus, stomach, and first part of the small intestine (duodenum). This procedure is done to check for problems such as inflammation, bleeding,  ulcers, or growths. During this procedure, a long, flexible, lighted tube with a camera attached (endoscope) is inserted down the throat. Tell a health care provider about:  Any allergies you have.  All medicines you are taking, including vitamins, herbs, eye drops, creams, and over-the-counter medicines.  Any problems you or family members have had with anesthetic medicines.  Any blood disorders you have.  Any surgeries you have had.  Any medical conditions you have.  Whether you are pregnant or may be pregnant. What are the risks? Generally, this is a safe procedure. However, problems may occur, including:  Infection.  Bleeding.  A tear (perforation) in the esophagus, stomach, or duodenum.  Trouble breathing.  Excessive sweating.  Spasms of the larynx.  A slowed heartbeat.  Low blood pressure.  What happens before the procedure?  Follow instructions from your health care provider about eating or drinking restrictions.  Ask your health care provider about: ? Changing or stopping your regular medicines. This is especially important if you are taking diabetes medicines or blood thinners. ? Taking medicines such as aspirin and ibuprofen. These medicines can thin your blood. Do not take these medicines before your procedure if your health care provider instructs you not to.  Plan to have someone take you home after the procedure.  If you wear dentures, be ready to remove  them before the procedure. What happens during the procedure?  To reduce your risk of infection, your health care team will wash or sanitize their hands.  An IV tube will be put in a vein in your hand or arm. You will get medicines and fluids through this tube.  You will be given one or more of the following: ? A medicine to help you relax (sedative). ? A medicine to numb the area (local anesthetic). This medicine may be sprayed into your throat. It will make you feel more comfortable and keep you  from gagging or coughing during the procedure. ? A medicine for pain.  A mouth guard may be placed in your mouth to protect your teeth and to keep you from biting on the endoscope.  You will be asked to lie on your left side.  The endoscope will be lowered down your throat into your esophagus, stomach, and duodenum.  Air will be put into the endoscope. This will help your health care provider see better.  The lining of your esophagus, stomach, and duodenum will be examined.  Your health care provider may: ? Take a tissue sample so it can be looked at in a lab (biopsy). ? Remove growths. ? Remove objects (foreign bodies) that are stuck. ? Treat any bleeding with medicines or other devices that stop tissue from bleeding. ? Widen (dilate) or stretch narrowed areas of your esophagus and stomach.  The endoscope will be taken out. The procedure may vary among health care providers and hospitals. What happens after the procedure?  Your blood pressure, heart rate, breathing rate, and blood oxygen level will be monitored often until the medicines you were given have worn off.  Do not eat or drink anything until the numbing medicine has worn off and your gag reflex has returned. This information is not intended to replace advice given to you by your health care provider. Make sure you discuss any questions you have with your health care provider. Document Released: 06/26/2004 Document Revised: 08/01/2015 Document Reviewed: 01/17/2015 Elsevier Interactive Patient Education  2018 [employer]. Esophagogastroduodenoscopy, Care After Refer to this sheet in the next few weeks. These instructions provide you with information about caring for yourself after your procedure. Your health care provider may also give you more specific instructions. Your treatment has been planned according to current medical practices, but problems sometimes occur. Call your health care provider if you have any problems or  questions after your procedure. What can I expect after the procedure? After the procedure, it is common to have:  A sore throat.  Nausea.  Bloating.  Dizziness.  Fatigue.  Follow these instructions at home:  Do not eat or drink anything until the numbing medicine (local anesthetic) has worn off and your gag reflex has returned. You will know that the local anesthetic has worn off when you can swallow comfortably.  Do not drive for 24 hours if you received a medicine to help you relax (sedative).  If your health care provider took a tissue sample for testing during the procedure, make sure to get your test results. This is your responsibility. Ask your health care provider or the department performing the test when your results will be ready.  Keep all follow-up visits as told by your health care provider. This is important. Contact a health care provider if:  You cannot stop coughing.  You are not urinating.  You are urinating less than usual. Get help right away if:  You have  trouble swallowing.  You cannot eat or drink.  You have throat or chest pain that gets worse.  You are dizzy or light-headed.  You faint.  You have nausea or vomiting.  You have chills.  You have a fever.  You have severe abdominal pain.  You have black, tarry, or bloody stools. This information is not intended to replace advice given to you by your health care provider. Make sure you discuss any questions you have with your health care provider. Document Released: 02/10/2012 Document Revised: 08/01/2015 Document Reviewed: 01/17/2015 Elsevier Interactive Patient Education  2018 [employer] Anesthesia is a term that refers to techniques, procedures, and medicines that help a person stay safe and comfortable during a medical procedure. Monitored anesthesia care, or sedation, is one type of anesthesia. Your anesthesia specialist may recommend sedation if you  will be having a procedure that does not require you to be unconscious, such as:  Cataract surgery.  A dental procedure.  A biopsy.  A colonoscopy.  During the procedure, you may receive a medicine to help you relax (sedative). There are three levels of sedation:  Mild sedation. At this level, you may feel awake and relaxed. You will be able to follow directions.  Moderate sedation. At this level, you will be sleepy. You may not remember the procedure.  Deep sedation. At this level, you will be asleep. You will not remember the procedure.  The more medicine you are given, the deeper your level of sedation will be. Depending on how you respond to the procedure, the anesthesia specialist may change your level of sedation or the type of anesthesia to fit your needs. An anesthesia specialist will monitor you closely during the procedure. Let your health care provider know about:  Any allergies you have.  All medicines you are taking, including vitamins, herbs, eye drops, creams, and over-the-counter medicines.  Any use of steroids (by mouth or as a cream).  Any problems you or family members have had with sedatives and anesthetic medicines.  Any blood disorders you have.  Any surgeries you have had.  Any medical conditions you have, such as sleep apnea.  Whether you are pregnant or may be pregnant.  Any use of cigarettes, alcohol, or street drugs. What are the risks? Generally, this is a safe procedure. However, problems may occur, including:  Getting too much medicine (oversedation).  Nausea.  Allergic reaction to medicines.  Trouble breathing. If this happens, a breathing tube may be used to help with breathing. It will be removed when you are awake and breathing on your own.  Heart trouble.  Lung trouble.  Before the procedure Staying hydrated Follow instructions from your health care provider about hydration, which may include:  Up to 2 hours before the  procedure - you may continue to drink clear liquids, such as water, clear fruit juice, black coffee, and plain tea.  Eating and drinking restrictions Follow instructions from your health care provider about eating and drinking, which may include:  8 hours before the procedure - stop eating heavy meals or foods such as meat, fried foods, or fatty foods.  6 hours before the procedure - stop eating light meals or foods, such as toast or cereal.  6 hours before the procedure - stop drinking milk or drinks that contain milk.  2 hours before the procedure - stop drinking clear liquids.  Medicines Ask your health care provider about:  Changing or stopping your regular medicines. This is  especially important if you are taking diabetes medicines or blood thinners.  Taking medicines such as aspirin and ibuprofen. These medicines can thin your blood. Do not take these medicines before your procedure if your health care provider instructs you not to.  Tests and exams  You will have a physical exam.  You may have blood tests done to show: ? How well your kidneys and liver are working. ? How well your blood can clot.  General instructions  Plan to have someone take you home from the hospital or clinic.  If you will be going home right after the procedure, plan to have someone with you for 24 hours.  What happens during the procedure?  Your blood pressure, heart rate, breathing, level of pain and overall condition will be monitored.  An IV tube will be inserted into one of your veins.  Your anesthesia specialist will give you medicines as needed to keep you comfortable during the procedure. This may mean changing the level of sedation.  The procedure will be performed. After the procedure  Your blood pressure, heart rate, breathing rate, and blood oxygen level will be monitored until the medicines you were given have worn off.  Do not drive for 24 hours if you received a  sedative.  You may: ? Feel sleepy, clumsy, or nauseous. ? Feel forgetful about what happened after the procedure. ? Have a sore throat if you had a breathing tube during the procedure. ? Vomit. This information is not intended to replace advice given to you by your health care provider. Make sure you discuss any questions you have with your health care provider. Document Released: 11/19/2004 Document Revised: 08/02/2015 Document Reviewed: 06/16/2015 Elsevier Interactive Patient Education  2018 [place], Care After These instructions provide you with information about caring for yourself after your procedure. Your health care provider may also give you more specific instructions. Your treatment has been planned according to current medical practices, but problems sometimes occur. Call your health care provider if you have any problems or questions after your procedure. What can I expect after the procedure? After your procedure, it is common to:  Feel sleepy for several hours.  Feel clumsy and have poor balance for several hours.  Feel forgetful about what happened after the procedure.  Have poor judgment for several hours.  Feel nauseous or vomit.  Have a sore throat if you had a breathing tube during the procedure.  Follow these instructions at home: For at least 24 hours after the procedure:   Do not: ? Participate in activities in which you could fall or become injured. ? Drive. ? Use heavy machinery. ? Drink alcohol. ? Take sleeping pills or medicines that cause drowsiness. ? Make important decisions or sign legal documents. ? Take care of children on your own.  Rest. Eating and drinking  Follow the diet that is recommended by your health care provider.  If you vomit, drink water, juice, or soup when you can drink without vomiting.  Make sure you have little or no nausea before eating solid foods. General instructions  Have a  responsible adult stay with you until you are awake and alert.  Take over-the-counter and prescription medicines only as told by your health care provider.  If you smoke, do not smoke without supervision.  Keep all follow-up visits as told by your health care provider. This is important. Contact a health care provider if:  You keep feeling nauseous or you keep  vomiting.  You feel light-headed.  You develop a rash.  You have a fever. Get help right away if:  You have trouble breathing. This information is not intended to replace advice given to you by your health care provider. Make sure you discuss any questions you have with your health care provider. Document Released: 06/16/2015 Document Revised: 10/16/2015 Document Reviewed: 06/16/2015 Elsevier Interactive Patient Education  [employer].

## 2017-10-26 ENCOUNTER — Other Ambulatory Visit: Payer: Self-pay

## 2017-10-26 ENCOUNTER — Ambulatory Visit (HOSPITAL_COMMUNITY): Payer: Medicare Other

## 2017-10-26 ENCOUNTER — Encounter (HOSPITAL_COMMUNITY)
Admission: RE | Admit: 2017-10-26 | Discharge: 2017-10-26 | Disposition: A | Discharge status: Home / Self Care | Payer: Medicare Other | Source: Ambulatory Visit | Attending: Gastroenterology | Admitting: Gastroenterology

## 2017-10-26 ENCOUNTER — Encounter (HOSPITAL_COMMUNITY): Payer: Self-pay

## 2017-10-26 DIAGNOSIS — R2689 Other abnormalities of gait and mobility: Secondary | ICD-10-CM | POA: Diagnosis not present

## 2017-10-26 DIAGNOSIS — Z888 Allergy status to other drugs, medicaments and biological substances status: Secondary | ICD-10-CM | POA: Diagnosis not present

## 2017-10-26 DIAGNOSIS — M1990 Unspecified osteoarthritis, unspecified site: Secondary | ICD-10-CM | POA: Diagnosis not present

## 2017-10-26 DIAGNOSIS — G8929 Other chronic pain: Secondary | ICD-10-CM | POA: Diagnosis not present

## 2017-10-26 DIAGNOSIS — M25551 Pain in right hip: Secondary | ICD-10-CM | POA: Diagnosis not present

## 2017-10-26 DIAGNOSIS — Z881 Allergy status to other antibiotic agents status: Secondary | ICD-10-CM | POA: Diagnosis not present

## 2017-10-26 DIAGNOSIS — Z79899 Other long term (current) drug therapy: Secondary | ICD-10-CM | POA: Diagnosis not present

## 2017-10-26 DIAGNOSIS — M25561 Pain in right knee: Principal | ICD-10-CM

## 2017-10-26 DIAGNOSIS — T39395A Adverse effect of other nonsteroidal anti-inflammatory drugs [NSAID], initial encounter: Secondary | ICD-10-CM | POA: Diagnosis not present

## 2017-10-26 DIAGNOSIS — Z96641 Presence of right artificial hip joint: Secondary | ICD-10-CM | POA: Diagnosis not present

## 2017-10-26 DIAGNOSIS — Z87891 Personal history of nicotine dependence: Secondary | ICD-10-CM | POA: Diagnosis not present

## 2017-10-26 DIAGNOSIS — F419 Anxiety disorder, unspecified: Secondary | ICD-10-CM | POA: Diagnosis not present

## 2017-10-26 DIAGNOSIS — Z885 Allergy status to narcotic agent status: Secondary | ICD-10-CM | POA: Diagnosis not present

## 2017-10-26 DIAGNOSIS — M797 Fibromyalgia: Secondary | ICD-10-CM | POA: Diagnosis not present

## 2017-10-26 DIAGNOSIS — R1013 Epigastric pain: Secondary | ICD-10-CM | POA: Diagnosis present

## 2017-10-26 DIAGNOSIS — Z880 Allergy status to penicillin: Secondary | ICD-10-CM | POA: Diagnosis not present

## 2017-10-26 DIAGNOSIS — Z01810 Encounter for preprocedural cardiovascular examination: Secondary | ICD-10-CM | POA: Diagnosis not present

## 2017-10-26 DIAGNOSIS — Z96651 Presence of right artificial knee joint: Secondary | ICD-10-CM | POA: Diagnosis not present

## 2017-10-26 DIAGNOSIS — F429 Obsessive-compulsive disorder, unspecified: Secondary | ICD-10-CM | POA: Diagnosis not present

## 2017-10-26 DIAGNOSIS — M6281 Muscle weakness (generalized): Secondary | ICD-10-CM | POA: Diagnosis not present

## 2017-10-26 DIAGNOSIS — J45909 Unspecified asthma, uncomplicated: Secondary | ICD-10-CM | POA: Diagnosis not present

## 2017-10-26 DIAGNOSIS — E039 Hypothyroidism, unspecified: Secondary | ICD-10-CM | POA: Diagnosis not present

## 2017-10-26 DIAGNOSIS — K219 Gastro-esophageal reflux disease without esophagitis: Secondary | ICD-10-CM | POA: Diagnosis not present

## 2017-10-26 DIAGNOSIS — F3181 Bipolar II disorder: Secondary | ICD-10-CM | POA: Diagnosis not present

## 2017-10-26 DIAGNOSIS — Z79890 Hormone replacement therapy (postmenopausal): Secondary | ICD-10-CM | POA: Diagnosis not present

## 2017-10-26 DIAGNOSIS — K2970 Gastritis, unspecified, without bleeding: Secondary | ICD-10-CM | POA: Diagnosis not present

## 2017-10-26 DIAGNOSIS — Z85828 Personal history of other malignant neoplasm of skin: Secondary | ICD-10-CM | POA: Diagnosis not present

## 2017-10-26 NOTE — Therapy (Signed)
[place], [place], [address] Phone: [phone]   Fax:  [phone]  Physical Therapy Treatment  Patient Details  Name: Jenna Contreras MRN: [id number] Date of Birth: [birth year] Referring Provider: Paralee Cancel, MD   Encounter Date: 10/26/2017  PT End of Session - 10/26/17 1419    Visit Number  3    Number of Visits  13    Date for PT Re-Evaluation  11/30/17    Authorization Type  Medicare Part A/B primary; [employer] Secondary (PT's ONLY, no auth required, no visit limit)    Authorization Time Period  10/19/17 - 12/03/17    Authorization - Visit Number  3    Authorization - Number of Visits  10    PT Start Time  5573    PT Stop Time  1507    PT Time Calculation (min)  47 min    Activity Tolerance  Patient tolerated treatment well    Behavior During Therapy  [facility] for tasks assessed/performed       Past Medical History:  Diagnosis Date  . Anxiety   . Arthritis   . Arthritis   . Asthma   . Bipolar 2 disorder ([place])   . Cancer (HCC)    Basal Cell  . Constipation   . Depression   . Fibromyalgia   . GERD (gastroesophageal reflux disease)   . Heart murmur    As small child   . History of gout   . History of skin cancer   . HNP (herniated nucleus pulposus with myelopathy), thoracic   . Hypothyroidism   . Left eye injury    In ED 06/17/2014   . Low BP   . Migraines   . OCD (obsessive compulsive disorder)   . Osteoporosis   . Psoriasis   . Scoliosis     Past Surgical History:  Procedure Laterality Date  . Arthroscopic left knee surgery    . DILATION AND CURETTAGE OF UTERUS  x3  . HIP SURGERY Right 2015  . iliac wings    . KNEE SURGERY Bilateral 2015, 2016  . MANDIBLE SURGERY     X 2  . PARTIAL KNEE ARTHROPLASTY Left 02/12/2014   Procedure: LEFT KNEE UNICOMPARTMENTAL MEDIALLY ARTHROPLASTY ;  Surgeon: Mauri Pole, MD;  Location: [facility];  Service: Orthopedics;  Laterality: Left;  . PARTIAL  KNEE ARTHROPLASTY Right 07/02/2014   Procedure: RIGHT UNI KNEE ARTHOPLASTY MEDIALLY;  Surgeon: Paralee Cancel, MD;  Location: [facility];  Service: Orthopedics;  Laterality: Right;  . rod in spine     scoliosis   . TONSILLECTOMY    . TOTAL HIP ARTHROPLASTY Right 05/09/2013   Procedure: RIGHT TOTAL HIP ARTHROPLASTY ANTERIOR APPROACH;  Surgeon: Mauri Pole, MD;  Location: [facility];  Service: Orthopedics;  Laterality: Right;  . TUBAL LIGATION    . WRIST SURGERY      There were no vitals filed for this visit.  Subjective Assessment - 10/26/17 1421    Subjective  Pt reports that she is in pain all over. She is in worse pain that before her eval. She staets that her MD took her off Voltaren and relates this increase in pain to that.    Pertinent History  T1-S1 scoliosis spine surgery with rods, Rt THA, Rt and Lt lateral unicondylar knee replacement    Limitations  Lifting;Standing;Walking;House hold activities    Diagnostic tests  Plan to have MRI this Friday 10/22/17 for Rt hip = MD  concerned there could be gluteus tear    Currently in Pain?  Yes    Pain Score  8     Pain Location  Hip    Pain Orientation  Right;Left    Pain Descriptors / Indicators  Stabbing    Pain Type  Chronic pain    Pain Onset  More than a month ago    Pain Frequency  Constant    Aggravating Factors   squatting    Pain Relieving Factors  unsure    Effect of Pain on Daily Activities  moderate limitation             [facility] Adult PT Treatment/Exercise - 10/26/17 0001      Exercises   Exercises  Knee/Hip      Knee/Hip Exercises: Stretches   Active Hamstring Stretch  Right;Left;2 reps;30 seconds    Active Hamstring Stretch Limitations  seated     Quad Stretch  Both;3 reps;30 seconds    Quad Stretch Limitations  prone with rope    Other Knee/Hip Stretches  LTRs 5x10" each    Other Knee/Hip Stretches  supine frog stretch 3x30"      Modalities   Modalities  Moist Heat      Moist Heat Therapy   Number Minutes  Moist Heat  8 Minutes   unbilled   Moist Heat Location  Lumbar Spine   for pain control     Manual Therapy   Manual Therapy  Soft tissue mobilization    Manual therapy comments  completed separate rest of treatment    Soft tissue mobilization  light STM to bil lumbar paraspinals for pain control             PT Education - 10/26/17 1511    Education Details  updatd HEP    Person(s) Educated  Patient    Methods  Explanation;Demonstration;Handout    Comprehension  Verbalized understanding;Returned demonstration       PT Short Term Goals - 10/20/17 0911      PT SHORT TERM GOAL #1   Title  Patient will be independent with HEP, updated PRN, to improve strength and functional mobility as well as reduce pain.    Time  2    Period  Weeks    Status  On-going      PT SHORT TERM GOAL #2   Title  Patient will improve MMT for limited group by 1/2 grade to demonstrate improved LE strength to reduce    Time  3    Period  Weeks    Status  On-going        PT Long Term Goals - 10/20/17 3825      PT LONG TERM GOAL #1   Title  Patient will perform 10 squats with normal depth, no early heel rise, and no excessive weight shift to right/equal symmetrical weight bearing to demonstrate improved mechanics and flexibility/ROM.     Time  6    Period  Weeks    Status  On-going      PT LONG TERM GOAL #2   Title  Patient will improve MMT to 5/5 for bil LE throughout all muscle groups to demonstrate improved functional LE strength.     Time  6    Period  Weeks    Status  On-going      PT LONG TERM GOAL #3   Title  Patient will achieve resting knee flexion of 90 degrees or greater to demonstrate improve hip flexor  and quadriceps length to improve flexibility for better mechanics with functional mobility.    Time  6    Period  Weeks    Status  On-going            Plan - 10/26/17 1508    Clinical Impression Statement  Pt presenting to therapy stating that her MD stopped her  Voltaren and she feels that has to do with her acute increase in pain this date. Pain management focus of today's session as she stated that her pain is worse now than it was before her eval. Began with stretching and modified her hip flexor/quad stretch to prone with rope as she stated that she did not feel a good stretch with the [employer] test stretch. Performed light manual STM to bil lumbar paraspinals for pain control and decreasing mm restrictions and applied moist heat pack to pt's lower back/upper hip region for continued pain control. Pt reporting decreased pain to 3/10 pain at EOS (was 8/10 at beginning). Continue POC as planned, progressing as able.    Rehab Potential  Fair    PT Frequency  2x / week    PT Duration  6 weeks    PT Treatment/Interventions  ADLs/Self Care Home Management;Aquatic Therapy;Cryotherapy;Electrical Stimulation;Iontophoresis 4mg /ml Dexamethasone;Moist Heat;DME Instruction;Gait training;Stair training;Functional mobility training;Therapeutic activities;Therapeutic exercise;Balance training;Neuromuscular re-education;Patient/family education;Manual techniques;Passive range of motion;Dry needling    PT Next Visit Plan  Continue hip strengthening and perform soft tissue mobilization to lateral right thigh PRN and joint mobility to Rt knee for pain reduction.     PT Home Exercise Plan  Eval: thomas stretch for hip flexor; 8/20: prone quad stretch with rope    Consulted and Agree with Plan of Care  Patient       Patient will benefit from skilled therapeutic intervention in order to improve the following deficits and impairments:  Abnormal gait, Decreased activity tolerance, Decreased endurance, Decreased range of motion, Decreased strength, Decreased balance, Decreased mobility, Difficulty walking, Increased muscle spasms, Impaired flexibility, Pain, Improper body mechanics, Postural dysfunction  Visit Diagnosis: Chronic pain of right knee  Pain in right hip  Muscle  weakness (generalized)  Other abnormalities of gait and mobility     Problem List Patient Active Problem List   Diagnosis Date Noted  . Epigastric pain 09/18/2015  . FH: colon cancer 09/18/2015  . Osteoporosis 05/20/2015  . Moderate single current episode of major depressive disorder ([place]) 02/18/2015  . S/P right UKR 07/02/2014  . Status post unilateral knee replacement 07/02/2014  . Asthma, mild intermittent 02/06/2014  . Lung nodule 02/06/2014  . Hyperlipidemia 01/18/2014  . Expected blood loss anemia 05/10/2013  . Overweight (BMI 25.0-29.9) 05/10/2013  . S/P right THA, AA 05/09/2013  . Pre-operative respiratory examination 05/07/2013  . Post-nasal drainage 05/07/2013  . Endometrial polyp 02/28/2013  . Osteoarthritis of right hip 02/15/2013  . Precordial pain 12/07/2012  . Chronic back pain 08/17/2012  . Hypothyroid 08/17/2012  . Osteopenia 08/17/2012  . Hypersomnia 11/18/2011  . Asthma, mild persistent 08/05/2011  . Memory change 08/05/2011  . TIA (transient ischemic attack) 08/05/2011  . Pulmonary nodule 08/05/2011  . SOB (shortness of breath) 01/05/2011       Geraldine Solar PT, DPT  [place] [address] [place], [place], [address] Phone: [phone]   Fax:  [phone]  Name: Jenna Contreras MRN: [id number] Date of Birth: 07/23/1945

## 2017-10-28 ENCOUNTER — Telehealth: Payer: Self-pay | Admitting: Family Medicine

## 2017-10-28 ENCOUNTER — Ambulatory Visit (HOSPITAL_COMMUNITY)
Admission: RE | Admit: 2017-10-28 | Discharge: 2017-10-28 | Disposition: A | Discharge status: Home / Self Care | Payer: Medicare Other | Source: Ambulatory Visit | Attending: Gastroenterology | Admitting: Gastroenterology

## 2017-10-28 ENCOUNTER — Ambulatory Visit (HOSPITAL_COMMUNITY): Payer: Medicare Other | Admitting: Anesthesiology

## 2017-10-28 ENCOUNTER — Encounter (HOSPITAL_COMMUNITY)
Admission: RE | Disposition: A | Discharge status: Home / Self Care | Payer: Self-pay | Source: Ambulatory Visit | Attending: Gastroenterology

## 2017-10-28 DIAGNOSIS — Z87891 Personal history of nicotine dependence: Secondary | ICD-10-CM | POA: Insufficient documentation

## 2017-10-28 DIAGNOSIS — T39395A Adverse effect of other nonsteroidal anti-inflammatory drugs [NSAID], initial encounter: Secondary | ICD-10-CM | POA: Diagnosis not present

## 2017-10-28 DIAGNOSIS — Z888 Allergy status to other drugs, medicaments and biological substances status: Secondary | ICD-10-CM | POA: Insufficient documentation

## 2017-10-28 DIAGNOSIS — F419 Anxiety disorder, unspecified: Secondary | ICD-10-CM | POA: Diagnosis not present

## 2017-10-28 DIAGNOSIS — Z881 Allergy status to other antibiotic agents status: Secondary | ICD-10-CM | POA: Insufficient documentation

## 2017-10-28 DIAGNOSIS — Z96641 Presence of right artificial hip joint: Secondary | ICD-10-CM | POA: Insufficient documentation

## 2017-10-28 DIAGNOSIS — E039 Hypothyroidism, unspecified: Secondary | ICD-10-CM | POA: Diagnosis not present

## 2017-10-28 DIAGNOSIS — F429 Obsessive-compulsive disorder, unspecified: Secondary | ICD-10-CM | POA: Insufficient documentation

## 2017-10-28 DIAGNOSIS — K3189 Other diseases of stomach and duodenum: Secondary | ICD-10-CM | POA: Diagnosis not present

## 2017-10-28 DIAGNOSIS — Z01810 Encounter for preprocedural cardiovascular examination: Secondary | ICD-10-CM | POA: Diagnosis not present

## 2017-10-28 DIAGNOSIS — J45909 Unspecified asthma, uncomplicated: Secondary | ICD-10-CM | POA: Diagnosis not present

## 2017-10-28 DIAGNOSIS — K2970 Gastritis, unspecified, without bleeding: Secondary | ICD-10-CM | POA: Diagnosis not present

## 2017-10-28 DIAGNOSIS — F3181 Bipolar II disorder: Secondary | ICD-10-CM | POA: Diagnosis not present

## 2017-10-28 DIAGNOSIS — Z885 Allergy status to narcotic agent status: Secondary | ICD-10-CM | POA: Insufficient documentation

## 2017-10-28 DIAGNOSIS — Z85828 Personal history of other malignant neoplasm of skin: Secondary | ICD-10-CM | POA: Insufficient documentation

## 2017-10-28 DIAGNOSIS — Z79899 Other long term (current) drug therapy: Secondary | ICD-10-CM | POA: Insufficient documentation

## 2017-10-28 DIAGNOSIS — K219 Gastro-esophageal reflux disease without esophagitis: Secondary | ICD-10-CM | POA: Diagnosis not present

## 2017-10-28 DIAGNOSIS — M1990 Unspecified osteoarthritis, unspecified site: Secondary | ICD-10-CM | POA: Diagnosis not present

## 2017-10-28 DIAGNOSIS — R1013 Epigastric pain: Secondary | ICD-10-CM | POA: Diagnosis not present

## 2017-10-28 DIAGNOSIS — M797 Fibromyalgia: Secondary | ICD-10-CM | POA: Diagnosis not present

## 2017-10-28 DIAGNOSIS — Z880 Allergy status to penicillin: Secondary | ICD-10-CM | POA: Insufficient documentation

## 2017-10-28 DIAGNOSIS — Z96651 Presence of right artificial knee joint: Secondary | ICD-10-CM | POA: Insufficient documentation

## 2017-10-28 DIAGNOSIS — Z79890 Hormone replacement therapy (postmenopausal): Secondary | ICD-10-CM | POA: Insufficient documentation

## 2017-10-28 HISTORY — PX: ESOPHAGOGASTRODUODENOSCOPY (EGD) WITH PROPOFOL: SHX5813

## 2017-10-28 HISTORY — PX: BIOPSY: SHX5522

## 2017-10-28 SURGERY — ESOPHAGOGASTRODUODENOSCOPY (EGD) WITH PROPOFOL
Anesthesia: Monitor Anesthesia Care

## 2017-10-28 MED ORDER — CHLORHEXIDINE GLUCONATE CLOTH 2 % EX PADS: 6.0000 | MEDICATED_PAD | Freq: Once | CUTANEOUS | Status: DC

## 2017-10-28 MED ORDER — PROPOFOL 500 MG/50ML IV EMUL
INTRAVENOUS | Status: DC | PRN
  Administered 2017-10-28: 135 ug/kg/min via INTRAVENOUS

## 2017-10-28 MED ORDER — ONDANSETRON HCL 4 MG/2ML IJ SOLN
INTRAMUSCULAR | Status: DC | PRN
  Administered 2017-10-28: 4 mg via INTRAVENOUS

## 2017-10-28 MED ORDER — MIDAZOLAM HCL 2 MG/2ML IJ SOLN
INTRAMUSCULAR | Status: AC
  Filled 2017-10-28: qty 2

## 2017-10-28 MED ORDER — LACTATED RINGERS IV SOLN: INTRAVENOUS | Status: DC

## 2017-10-28 MED ORDER — MIDAZOLAM HCL 5 MG/5ML IJ SOLN
INTRAMUSCULAR | Status: DC | PRN
  Administered 2017-10-28: 2 mg via INTRAVENOUS

## 2017-10-28 MED ORDER — PROMETHAZINE HCL 25 MG/ML IJ SOLN: 6.2500 mg | INTRAMUSCULAR | Status: DC | PRN

## 2017-10-28 MED ORDER — PROPOFOL 10 MG/ML IV BOLUS
INTRAVENOUS | Status: AC
  Filled 2017-10-28: qty 20

## 2017-10-28 MED ORDER — LACTATED RINGERS IV SOLN
INTRAVENOUS | Status: DC
  Administered 2017-10-28: 08:00:00 via INTRAVENOUS

## 2017-10-28 MED ORDER — HYDROCODONE-ACETAMINOPHEN 7.5-325 MG PO TABS: 1.0000 | ORAL_TABLET | Freq: Once | ORAL | Status: DC | PRN

## 2017-10-28 MED ORDER — HYDROMORPHONE HCL 1 MG/ML IJ SOLN: 0.2500 mg | INTRAMUSCULAR | Status: DC | PRN

## 2017-10-28 NOTE — Anesthesia Postprocedure Evaluation (Signed)
Anesthesia Post Note  Patient: Makynzi Britton-Watkins  Procedure(s) Performed: ESOPHAGOGASTRODUODENOSCOPY (EGD) WITH PROPOFOL (N/A ) BIOPSY  Patient location during evaluation: PACU Anesthesia Type: MAC Level of consciousness: awake and patient cooperative Pain management: pain level controlled Vital Signs Assessment: post-procedure vital signs reviewed and stable Respiratory status: spontaneous breathing, nonlabored ventilation and respiratory function stable Cardiovascular status: blood pressure returned to baseline Postop Assessment: no apparent nausea or vomiting Anesthetic complications: no     Last Vitals:  Vitals:   10/28/17 0733  BP: 101/62  Pulse: (!) 53  Resp: 18  Temp: 36.6 C  SpO2: (!) 16%    Last Pain:  Vitals:   10/28/17 0733  TempSrc: Oral  PainSc: 0-No pain                 Rayanna Matusik J

## 2017-10-28 NOTE — Telephone Encounter (Signed)
Please advise 

## 2017-10-28 NOTE — Telephone Encounter (Signed)
Patient is aware of all. 

## 2017-10-28 NOTE — Interval H&P Note (Signed)
History and Physical Interval Note:  10/28/2017 8:09 AM  Jenna Contreras  has presented today for surgery, with the diagnosis of dyspepsia  The various methods of treatment have been discussed with the patient and family. After consideration of risks, benefits and other options for treatment, the patient has consented to  Procedure(s) with comments: ESOPHAGOGASTRODUODENOSCOPY (EGD) WITH PROPOFOL (N/A) - 8:15am as a surgical intervention .  The patient's history has been reviewed, patient examined, no change in status, stable for surgery.  I have reviewed the patient's chart and labs.  Questions were answered to the patient's satisfaction.     [employer]

## 2017-10-28 NOTE — Telephone Encounter (Signed)
Had an endoscopy today and biopsy done but she forgot to take her pre-procedure antibiotics (Clindamycin)  that she usually takes.  Should she take it now?

## 2017-10-28 NOTE — Transfer of Care (Signed)
Immediate Anesthesia Transfer of Care Note  Patient: Jenna Contreras  Procedure(s) Performed: ESOPHAGOGASTRODUODENOSCOPY (EGD) WITH PROPOFOL (N/A ) BIOPSY  Patient Location: PACU  Anesthesia Type:MAC  Level of Consciousness: drowsy  Airway & Oxygen Therapy: Patient Spontanous Breathing and Patient connected to nasal cannula oxygen  Post-op Assessment: Report given to RN, Post -op Vital signs reviewed and stable and Patient moving all extremities  Post vital signs: Reviewed and stable  Last Vitals:  Vitals Value Taken Time  BP    Temp    Pulse 58 10/28/2017  8:47 AM  Resp    SpO2 100 % 10/28/2017  8:47 AM  Vitals shown include unvalidated device data.  Last Pain:  Vitals:   10/28/17 0733  TempSrc: Oral  PainSc: 0-No pain      Patients Stated Pain Goal: 5 (18/33/58 2518)  Complications: No apparent anesthesia complications

## 2017-10-28 NOTE — Discharge Instructions (Signed)
You have MODERATE gastritis due VOLTAREN. YOUR SMALL BOWEL LOOKED NORMAL. I biopsied your stomach and small bowel.    DRINK WATER TO KEEP YOUR URINE LIGHT YELLOW.  FOLLOW A LOW FAT DIET. MEATS SHOULD BE BAKED, BROILED, OR BOILED. AVOID FRIED FOODS. SEE INFO BELOW.  CONTINUE PROTONIX.  TAKE 30 MINUTES PRIOR TO YOUR FIRST MEAL EVERY DAY.  YOUR BIOPSY RESULTS WILL BE AVAILABLE IN 7 DAYS.   FOLLOW UP IN 4 MOS. With Jenna Contreras on December 16 at 11:00am  UPPER ENDOSCOPY AFTER CARE Read the instructions outlined below and refer to this sheet in the next week. These discharge instructions provide you with general information on caring for yourself after you leave the hospital. While your treatment has been planned according to the most current medical practices available, unavoidable complications occasionally occur. If you have any problems or questions after discharge, call DR. FIELDS, [phone].  ACTIVITY  You may resume your regular activity, but move at a slower pace for the next 24 hours.   Take frequent rest periods for the next 24 hours.   Walking will help get rid of the air and reduce the bloated feeling in your belly (abdomen).   No driving for 24 hours (because of the medicine (anesthesia) used during the test).   You may shower.   Do not sign any important legal documents or operate any machinery for 24 hours (because of the anesthesia used during the test).    NUTRITION  Drink plenty of fluids.   You may resume your normal diet as instructed by your doctor.   Begin with a light meal and progress to your normal diet. Heavy or fried foods are harder to digest and may make you feel sick to your stomach (nauseated).   Avoid alcoholic beverages for 24 hours or as instructed.    MEDICATIONS  You may resume your normal medications.   WHAT YOU CAN EXPECT TODAY  Some feelings of bloating in the abdomen.   Passage of more gas than usual.    IF YOU HAD A BIOPSY TAKEN  DURING THE UPPER ENDOSCOPY:  Eat a soft diet IF YOU HAVE NAUSEA, BLOATING, ABDOMINAL PAIN, OR VOMITING.    FINDING OUT THE RESULTS OF YOUR TEST Not all test results are available during your visit. DR. Oneida Alar WILL CALL YOU WITHIN 14 DAYS OF YOUR PROCEDUE WITH YOUR RESULTS. Do not assume everything is normal if you have not heard from DR. FIELDS, CALL HER OFFICE AT [phone].  SEEK IMMEDIATE MEDICAL ATTENTION AND CALL THE OFFICE: [phone] IF:  You have more than a spotting of blood in your stool.   Your belly is swollen (abdominal distention).   You are nauseated or vomiting.   You have a temperature over 101F.   You have abdominal pain or discomfort that is severe or gets worse throughout the day.   Gastritis  Gastritis is an inflammation (the body's way of reacting to injury and/or infection) of the stomach. It is often caused by viral or bacterial (germ) infections. It can also be caused BY ASPIRIN, BC/GOODY POWDER'S, (IBUPROFEN) MOTRIN, OR ALEVE (NAPROXEN), chemicals (including alcohol), SPICY FOODS, and medications. This illness may be associated with generalized malaise (feeling tired, not well), UPPER ABDOMINAL STOMACH cramps, and fever. One common bacterial cause of gastritis is an organism known as H. Pylori. This can be treated with antibiotics.    Low-Fat Diet  BREADS, CEREALS, PASTA, RICE, DRIED PEAS, AND BEANS These products are high in carbohydrates and most are  low in fat. Therefore, they can be increased in the diet as substitutes for fatty foods. They too, however, contain calories and should not be eaten in excess. Cereals can be eaten for snacks as well as for breakfast.  Include foods that contain fiber (fruits, vegetables, whole grains, and legumes). Research shows that fiber may lower blood cholesterol levels, especially the water-soluble fiber found in fruits, vegetables, oat products, and legumes.  FRUITS AND VEGETABLES It is good to eat fruits and  vegetables. Besides being sources of fiber, both are rich in vitamins and some minerals. They help you get the daily allowances of these nutrients. Fruits and vegetables can be used for snacks and desserts.  MEATS Limit lean meat, chicken, [place], and fish to no more than 6 ounces per day.  Beef, Pork, and Lamb Use lean cuts of beef, pork, and lamb. Lean cuts include:  Extra-lean ground beef.  Arm roast.  Sirloin tip.  Center-cut ham.  Round steak.  Loin chops.  Rump roast.  Tenderloin.  Trim all fat off the outside of meats before cooking. It is not necessary to severely decrease the intake of red meat, but lean choices should be made. Lean meat is rich in protein and contains a highly absorbable form of iron. Premenopausal women, in particular, should avoid reducing lean red meat because this could increase the risk for low red blood cells (iron-deficiency anemia).  Chicken and [place] These are good sources of protein. The fat of poultry can be reduced by removing the skin and underlying fat layers before cooking. Chicken and [place] can be substituted for lean red meat in the diet. Poultry should not be fried or covered with high-fat sauces.  Fish and Shellfish Fish is a good source of protein. Shellfish contain cholesterol, but they usually are low in saturated fatty acids. The preparation of fish is important. Like chicken and [place], they should not be fried or covered with high-fat sauces.  EGGS Egg whites contain no fat or cholesterol. They can be eaten often. Try 1 to 2 egg whites instead of whole eggs in recipes or use egg substitutes that do not contain yolk.  MILK AND DAIRY PRODUCTS Use skim or 1% milk instead of 2% or whole milk. Decrease whole milk, natural, and processed cheeses. Use nonfat or low-fat (2%) cottage cheese or low-fat cheeses made from vegetable oils. Choose nonfat or low-fat (1 to 2%) yogurt. Experiment with evaporated skim milk in recipes that call for heavy  cream. Substitute low-fat yogurt or low-fat cottage cheese for sour cream in dips and salad dressings. Have at least 2 servings of low-fat dairy products, such as 2 glasses of skim (or 1%) milk each day to help get your daily calcium intake.  FATS AND OILS Reduce the total intake of fats, especially saturated fat. Butterfat, lard, and beef fats are high in saturated fat and cholesterol. These should be avoided as much as possible. Vegetable fats do not contain cholesterol, but certain vegetable fats, such as coconut oil, palm oil, and palm kernel oil are very high in saturated fats. These should be limited. These fats are often used in bakery goods, processed foods, popcorn, oils, and nondairy creamers. Vegetable shortenings and some peanut butters contain hydrogenated oils, which are also saturated fats. Read the labels on these foods and check for saturated vegetable oils.  Unsaturated vegetable oils and fats do not raise blood cholesterol. However, they should be limited because they are fats and are high in calories. Total fat  should still be limited to 30% of your daily caloric intake. Desirable liquid vegetable oils are corn oil, cottonseed oil, olive oil, canola oil, safflower oil, soybean oil, and sunflower oil. Peanut oil is not as good, but small amounts are acceptable. Buy a heart-healthy tub margarine that has no partially hydrogenated oils in the ingredients. Mayonnaise and salad dressings often are made from unsaturated fats, but they should also be limited because of their high calorie and fat content. Seeds, nuts, peanut butter, olives, and avocados are high in fat, but the fat is mainly the unsaturated type. These foods should be limited mainly to avoid excess calories and fat.  OTHER EATING TIPS Snacks  Most sweets should be limited as snacks. They tend to be rich in calories and fats, and their caloric content outweighs their nutritional value. Some good choices in snacks are graham  crackers, melba toast, soda crackers, bagels (no egg), English muffins, fruits, and vegetables. These snacks are preferable to snack crackers, [place] fries, and chips. Popcorn should be air-popped or cooked in small amounts of liquid vegetable oil.  Desserts Eat fruit, low-fat yogurt, and fruit ices instead of pastries, cake, and cookies. Sherbet, angel food cake, gelatin dessert, frozen low-fat yogurt, or other frozen products that do not contain saturated fat (pure fruit juice bars, frozen ice pops) are also acceptable.   COOKING METHODS Choose those methods that use little or no fat. They include: Poaching.  Braising.  Steaming.  Grilling.  Baking.  Stir-frying.  Broiling.  Microwaving.  Foods can be cooked in a nonstick pan without added fat, or use a nonfat cooking spray in regular cookware. Limit fried foods and avoid frying in saturated fat. Add moisture to lean meats by using water, broth, cooking wines, and other nonfat or low-fat sauces along with the cooking methods mentioned above. Soups and stews should be chilled after cooking. The fat that forms on top after a few hours in the refrigerator should be skimmed off. When preparing meals, avoid using excess salt. Salt can contribute to raising blood pressure in some people.  EATING AWAY FROM HOME Order entres, potatoes, and vegetables without sauces or butter. When meat exceeds the size of a deck of cards (3 to 4 ounces), the rest can be taken home for another meal. Choose vegetable or fruit salads and ask for low-calorie salad dressings to be served on the side. Use dressings sparingly. Limit high-fat toppings, such as bacon, crumbled eggs, cheese, sunflower seeds, and olives. Ask for heart-healthy tub margarine instead of butter.

## 2017-10-28 NOTE — Anesthesia Preprocedure Evaluation (Signed)
Anesthesia Evaluation  Patient identified by MRN, date of birth, ID band Patient awake    Reviewed: Allergy & Precautions, H&P , NPO status , Patient's Chart, lab work & pertinent test results, reviewed documented beta blocker date and time   Airway Mallampati: II  TM Distance: >3 FB Neck ROM: full    Dental no notable dental hx. (+) Teeth Intact, Dental Advidsory Given   Pulmonary neg pulmonary ROS, asthma , former smoker,    Pulmonary exam normal breath sounds clear to auscultation       Cardiovascular Exercise Tolerance: Good negative cardio ROS  + Valvular Problems/Murmurs  Rhythm:regular Rate:Normal     Neuro/Psych  Headaches, Anxiety Depression Bipolar Disorder TIA Neuromuscular disease negative neurological ROS  negative psych ROS   GI/Hepatic negative GI ROS, Neg liver ROS, GERD  ,  Endo/Other  negative endocrine ROSHypothyroidism   Renal/GU negative Renal ROS  negative genitourinary   Musculoskeletal   Abdominal   Peds  Hematology negative hematology ROS (+) anemia ,   Anesthesia Other Findings   Reproductive/Obstetrics negative OB ROS                             Anesthesia Physical Anesthesia Plan  ASA: II  Anesthesia Plan: MAC   Post-op Pain Management:    Induction:   PONV Risk Score and Plan:   Airway Management Planned:   Additional Equipment:   Intra-op Plan:   Post-operative Plan:   Informed Consent: I have reviewed the patients History and Physical, chart, labs and discussed the procedure including the risks, benefits and alternatives for the proposed anesthesia with the patient or authorized representative who has indicated his/her understanding and acceptance.   Dental Advisory Given  Plan Discussed with: CRNA and Anesthesiologist  Anesthesia Plan Comments:         Anesthesia Quick Evaluation

## 2017-10-28 NOTE — Telephone Encounter (Signed)
Since already done would hold off

## 2017-10-28 NOTE — Op Note (Signed)
[facility] Patient Name: Jenna Contreras Procedure Date: 10/28/2017 8:18 AM MRN: [id number] Date of Birth: 08-21-1945 Attending MD: Barney Drain MD, MD CSN: [id number] Age: 72 Admit Type: Outpatient Procedure:                Upper GI endoscopy WITH COLD FORCEPS BIOPSY Indications:              Dyspepsia ON VOLTAREN DAILY AND OF PROTONIX FOR AT                            LEAST ONE YEAR. Providers:                Barney Drain MD, MD, Janeece Riggers, RN, Nelma Rothman,                            Technician Referring MD:             Elayne Snare Luking Medicines:                Propofol per Anesthesia Complications:            No immediate complications. Estimated Blood Loss:     Estimated blood loss was minimal. Procedure:                Pre-Anesthesia Assessment:                           - Prior to the procedure, a History and Physical                            was performed, and patient medications and                            allergies were reviewed. The patient's tolerance of                            previous anesthesia was also reviewed. The risks                            and benefits of the procedure and the sedation                            options and risks were discussed with the patient.                            All questions were answered, and informed consent                            was obtained. Prior Anticoagulants: The patient has                            taken previous NSAID medication, last dose was 1                            day prior to procedure. ASA Grade Assessment: II -  A patient with mild systemic disease. After                            reviewing the risks and benefits, the patient was                            deemed in satisfactory condition to undergo the                            procedure. After obtaining informed consent, the                            endoscope was passed under direct vision.                 Throughout the procedure, the patient's blood                            pressure, pulse, and oxygen saturations were                            monitored continuously. The GIF-[id number] ([id number])                            scope was introduced through the mouth, and                            advanced to the second part of duodenum. The upper                            GI endoscopy was accomplished without difficulty.                            The patient tolerated the procedure well. Scope In: 8:28:15 AM Scope Out: 8:35:06 AM Total Procedure Duration: 0 hours 6 minutes 51 seconds  Findings:      The examined esophagus was normal.      Diffuse moderate inflammation characterized by congestion (edema) and       erythema was found in the entire examined stomach. Biopsies(2: BODY, 3:       ANTRUM) were taken with a cold forceps for Helicobacter pylori testing.      The examined duodenum was normal. Biopsies(2: BULB, 4: ANTRUM) for       histology were taken with a cold forceps for evaluation of celiac       disease. Impression:               - DYSPEPSIA DUE TO NSAID Gastritis. Biopsied. Moderate Sedation:      Per Anesthesia Care Recommendation:           - Patient has a contact number available for                            emergencies. The signs and symptoms of potential                            delayed complications were discussed with the  patient. Return to normal activities tomorrow.                            Written discharge instructions were provided to the                            patient.                           - Low fat diet.                           - Continue present medications.                           - Await pathology results.                           - Return to my office in 4 months. Procedure Code(s):        --- Professional ---                           [phone], Esophagogastroduodenoscopy, flexible,                             transoral; with biopsy, single or multiple Diagnosis Code(s):        --- Professional ---                           K29.70, Gastritis, unspecified, without bleeding                           R10.13, Epigastric pain CPT copyright 2017 American Medical Association. All rights reserved. The codes documented in this report are preliminary and upon coder review may  be revised to meet current compliance requirements. Barney Drain, MD Barney Drain MD, MD 10/28/2017 9:07:18 AM This report has been signed electronically. Number of Addenda: 0

## 2017-10-29 ENCOUNTER — Ambulatory Visit (HOSPITAL_COMMUNITY): Payer: Medicare Other

## 2017-10-29 ENCOUNTER — Telehealth: Payer: Self-pay | Admitting: Gastroenterology

## 2017-10-29 ENCOUNTER — Other Ambulatory Visit: Payer: Self-pay

## 2017-10-29 ENCOUNTER — Encounter (HOSPITAL_COMMUNITY): Payer: Self-pay

## 2017-10-29 DIAGNOSIS — M6281 Muscle weakness (generalized): Secondary | ICD-10-CM

## 2017-10-29 DIAGNOSIS — M25551 Pain in right hip: Secondary | ICD-10-CM | POA: Diagnosis not present

## 2017-10-29 DIAGNOSIS — R2689 Other abnormalities of gait and mobility: Secondary | ICD-10-CM

## 2017-10-29 DIAGNOSIS — G8929 Other chronic pain: Secondary | ICD-10-CM | POA: Diagnosis not present

## 2017-10-29 DIAGNOSIS — R1013 Epigastric pain: Secondary | ICD-10-CM

## 2017-10-29 DIAGNOSIS — M25561 Pain in right knee: Principal | ICD-10-CM

## 2017-10-29 NOTE — Telephone Encounter (Signed)
Please call pt. HER stomach Bx shows gastritis DUE TO VOLTAREN.    SHE NEEDS A CT ABD W/ IV AND ORAL CONTRAST TO COMPLETE THE EVALUATION OF THE UPPER ABDOMINAL PAIN.  DRINK WATER TO KEEP YOUR URINE LIGHT YELLOW.  FOLLOW A LOW FAT DIET. MEATS SHOULD BE BAKED, BROILED, OR BOILED. AVOID FRIED FOODS.   CONTINUE PROTONIX.  TAKE 30 MINUTES PRIOR TO YOUR FIRST MEAL EVERY DAY.   FOLLOW UP IN 4 MOS WITH DR. Dashanna Kinnamon.

## 2017-10-29 NOTE — Therapy (Signed)
[place], [place], [address] Phone: [phone]   Fax:  [phone]  Physical Therapy Treatment  Patient Details  Name: Jenna Contreras MRN: [id number] Date of Birth: October 23, 1945 Referring Provider: Paralee Cancel, MD   Encounter Date: 10/29/2017  PT End of Session - 10/29/17 1056    Visit Number  4    Number of Visits  13    Date for PT Re-Evaluation  11/30/17    Authorization Type  Medicare Part A/B primary; [employer] Secondary (PT's ONLY, no auth required, no visit limit)    Authorization Time Period  10/19/17 - 12/03/17    Authorization - Visit Number  4    Authorization - Number of Visits  10    PT Start Time  1038    PT Stop Time  1117    PT Time Calculation (min)  39 min    Activity Tolerance  Patient tolerated treatment well    Behavior During Therapy  [facility] for tasks assessed/performed       Past Medical History:  Diagnosis Date  . Anxiety   . Arthritis   . Arthritis   . Asthma   . Bipolar 2 disorder ([place])   . Cancer (HCC)    Basal Cell  . Constipation   . Depression   . Fibromyalgia   . GERD (gastroesophageal reflux disease)   . Heart murmur    As small child   . History of gout   . History of skin cancer   . HNP (herniated nucleus pulposus with myelopathy), thoracic   . Hypothyroidism   . Left eye injury    In ED 06/17/2014   . Low BP   . Migraines   . OCD (obsessive compulsive disorder)   . Osteoporosis   . Psoriasis   . Scoliosis     Past Surgical History:  Procedure Laterality Date  . Arthroscopic left knee surgery    . DILATION AND CURETTAGE OF UTERUS  x3  . HIP SURGERY Right 2015  . iliac wings    . KNEE SURGERY Bilateral 2015, 2016  . MANDIBLE SURGERY     X 2  . PARTIAL KNEE ARTHROPLASTY Left 02/12/2014   Procedure: LEFT KNEE UNICOMPARTMENTAL MEDIALLY ARTHROPLASTY ;  Surgeon: Mauri Pole, MD;  Location: [facility];  Service: Orthopedics;  Laterality: Left;  . PARTIAL  KNEE ARTHROPLASTY Right 07/02/2014   Procedure: RIGHT UNI KNEE ARTHOPLASTY MEDIALLY;  Surgeon: Paralee Cancel, MD;  Location: [facility];  Service: Orthopedics;  Laterality: Right;  . rod in spine     scoliosis   . TONSILLECTOMY    . TOTAL HIP ARTHROPLASTY Right 05/09/2013   Procedure: RIGHT TOTAL HIP ARTHROPLASTY ANTERIOR APPROACH;  Surgeon: Mauri Pole, MD;  Location: [facility];  Service: Orthopedics;  Laterality: Right;  . TUBAL LIGATION    . WRIST SURGERY      There were no vitals filed for this visit.  Subjective Assessment - 10/29/17 1047    Subjective  Patient reports both ihps are bothering her today and that she is still having quite a bit of pain in her right knee. She reprots againt hat she believes her pain is worse now that she has stopped taking voltaren.     Pertinent History  T1-S1 scoliosis spine surgery with rods, Rt THA, Rt and Lt lateral unicondylar knee replacement    Limitations  Lifting;Standing;Walking;House hold activities    Diagnostic tests  Plan to have MRI this Friday  10/22/17 for Rt hip = MD concerned there could be gluteus tear    Currently in Pain?  Yes    Pain Score  7     Pain Location  Hip   hip and knee   Pain Orientation  Right    Pain Descriptors / Indicators  Aching;Stabbing    Pain Type  Chronic pain    Pain Onset  More than a month ago    Pain Frequency  Constant    Aggravating Factors   squatting, yardwork    Pain Relieving Factors  unsure    Effect of Pain on Daily Activities  moderate        [facility] Adult PT Treatment/Exercise - 10/29/17 0001      Exercises   Exercises  Knee/Hip      Knee/Hip Exercises: Stretches   Active Hamstring Stretch  Right;Left;3 reps;30 seconds    Active Hamstring Stretch Limitations  supine with rope bil LE    Other Knee/Hip Stretches  LTRs 5x10" each    Other Knee/Hip Stretches  supine frog stretch 3x30"      Knee/Hip Exercises: Supine   Bridges  Strengthening;Both;15 reps;1 set    Other Supine Knee/Hip Exercises   Ab set with bent, 1x 15 reps, 3 sec holds; supine marching with TrA activation, 1x 15 reps      Moist Heat Therapy   Number Minutes Moist Heat  20 Minutes   therapuetic exercises performed in supine with moist heat   Moist Heat Location  Lumbar Spine   for pain     Manual Therapy   Manual Therapy  Soft tissue mobilization    Manual therapy comments  completed separate rest of treatment    Soft tissue mobilization  soft tissue mobilization to bil lumbar paraspinals and superior gluteus maximus muscle to redues trigger points and for pain relief       PT Education - 10/29/17 1057    Education Details  Educated on exercise an dintervention purpose throughout.     Person(s) Educated  Patient    Methods  Explanation    Comprehension  Verbalized understanding       PT Short Term Goals - 10/20/17 0911      PT SHORT TERM GOAL #1   Title  Patient will be independent with HEP, updated PRN, to improve strength and functional mobility as well as reduce pain.    Time  2    Period  Weeks    Status  On-going      PT SHORT TERM GOAL #2   Title  Patient will improve MMT for limited group by 1/2 grade to demonstrate improved LE strength to reduce    Time  3    Period  Weeks    Status  On-going        PT Long Term Goals - 10/20/17 6468      PT LONG TERM GOAL #1   Title  Patient will perform 10 squats with normal depth, no early heel rise, and no excessive weight shift to right/equal symmetrical weight bearing to demonstrate improved mechanics and flexibility/ROM.     Time  6    Period  Weeks    Status  On-going      PT LONG TERM GOAL #2   Title  Patient will improve MMT to 5/5 for bil LE throughout all muscle groups to demonstrate improved functional LE strength.     Time  6    Period  Weeks  Status  On-going      PT LONG TERM GOAL #3   Title  Patient will achieve resting knee flexion of 90 degrees or greater to demonstrate improve hip flexor and quadriceps length to improve  flexibility for better mechanics with functional mobility.    Time  6    Period  Weeks    Status  On-going         Plan - 10/29/17 1056    Clinical Impression Statement  Patient reports she is still in a great deal of pain with her Rt LE and bil hips now. She is still not on Voltaren and believes that is part of why her pain is high. She performed stretching and supine hip strengthening with moist heat for pain management and EOS continued with soft tissue mobilization this date to address hip/buttock pain. She will continue to benefit from skilled PT interventions to address impairments and progress flexibility and strength.    Rehab Potential  Fair    PT Frequency  2x / week    PT Duration  6 weeks    PT Treatment/Interventions  ADLs/Self Care Home Management;Aquatic Therapy;Cryotherapy;Electrical Stimulation;Iontophoresis 4mg /ml Dexamethasone;Moist Heat;DME Instruction;Gait training;Stair training;Functional mobility training;Therapeutic activities;Therapeutic exercise;Balance training;Neuromuscular re-education;Patient/family education;Manual techniques;Passive range of motion;Dry needling    PT Next Visit Plan  Continue hip strengthening and perform soft tissue mobilization to lateral right thigh PRN and joint mobility to Rt knee for pain reduction. Progress exercise to standing to address Rt LE strength and knee mobility. Consider use of iontophoresis for knee pain (MD prescribed initially for patient).    PT Home Exercise Plan  Eval: thomas stretch for hip flexor; 8/20: prone quad stretch with rope    Consulted and Agree with Plan of Care  Patient       Patient will benefit from skilled therapeutic intervention in order to improve the following deficits and impairments:  Abnormal gait, Decreased activity tolerance, Decreased endurance, Decreased range of motion, Decreased strength, Decreased balance, Decreased mobility, Difficulty walking, Increased muscle spasms, Impaired flexibility,  Pain, Improper body mechanics, Postural dysfunction  Visit Diagnosis: Chronic pain of right knee  Pain in right hip  Muscle weakness (generalized)  Other abnormalities of gait and mobility     Problem List Patient Active Problem List   Diagnosis Date Noted  . Dyspepsia   . Epigastric pain 09/18/2015  . FH: colon cancer 09/18/2015  . Osteoporosis 05/20/2015  . Moderate single current episode of major depressive disorder ([place]) 02/18/2015  . S/P right UKR 07/02/2014  . Status post unilateral knee replacement 07/02/2014  . Asthma, mild intermittent 02/06/2014  . Lung nodule 02/06/2014  . Hyperlipidemia 01/18/2014  . Expected blood loss anemia 05/10/2013  . Overweight (BMI 25.0-29.9) 05/10/2013  . S/P right THA, AA 05/09/2013  . Pre-operative respiratory examination 05/07/2013  . Post-nasal drainage 05/07/2013  . Endometrial polyp 02/28/2013  . Osteoarthritis of right hip 02/15/2013  . Precordial pain 12/07/2012  . Chronic back pain 08/17/2012  . Hypothyroid 08/17/2012  . Osteopenia 08/17/2012  . Hypersomnia 11/18/2011  . Asthma, mild persistent 08/05/2011  . Memory change 08/05/2011  . TIA (transient ischemic attack) 08/05/2011  . Pulmonary nodule 08/05/2011  . SOB (shortness of breath) 01/05/2011    Kipp Brood, PT, DPT Physical Therapist with [facility]  10/29/2017 12:17 PM    [place], [place], [address] Phone: [phone]   Fax:  [phone]  Name: Jenna Contreras MRN: [id number]  Date of Birth: 12-27-1945

## 2017-11-01 DIAGNOSIS — H2512 Age-related nuclear cataract, left eye: Secondary | ICD-10-CM | POA: Diagnosis not present

## 2017-11-01 NOTE — Telephone Encounter (Signed)
CT scheduled for 11/17/17 at 11:00am, arrival time 10:45am, npo 4 hrs prior, pick up oral contrast from [facility].

## 2017-11-01 NOTE — Telephone Encounter (Signed)
ON RECALL  °

## 2017-11-01 NOTE — Telephone Encounter (Signed)
PT is aware and OK to schedule the CT.  Forwarding to [facility] to schedule.

## 2017-11-02 NOTE — Telephone Encounter (Signed)
Left detailed message on voicemail regarding CT appt. Letter also mailed.

## 2017-11-03 ENCOUNTER — Ambulatory Visit (HOSPITAL_COMMUNITY): Payer: Medicare Other

## 2017-11-03 ENCOUNTER — Encounter (HOSPITAL_COMMUNITY): Payer: Self-pay | Admitting: Gastroenterology

## 2017-11-03 ENCOUNTER — Other Ambulatory Visit: Payer: Self-pay

## 2017-11-03 DIAGNOSIS — G8929 Other chronic pain: Secondary | ICD-10-CM

## 2017-11-03 DIAGNOSIS — M25551 Pain in right hip: Secondary | ICD-10-CM | POA: Diagnosis not present

## 2017-11-03 DIAGNOSIS — M25561 Pain in right knee: Principal | ICD-10-CM

## 2017-11-03 DIAGNOSIS — R2689 Other abnormalities of gait and mobility: Secondary | ICD-10-CM | POA: Diagnosis not present

## 2017-11-03 DIAGNOSIS — M6281 Muscle weakness (generalized): Secondary | ICD-10-CM | POA: Diagnosis not present

## 2017-11-03 NOTE — Therapy (Signed)
[place], [place], [address] Phone: [phone]   Fax:  [phone]  Physical Therapy Treatment  Patient Details  Name: Jenna Contreras MRN: [id number] Date of Birth: Apr 01, 1945 Referring Provider: Paralee Cancel, MD   Encounter Date: 11/03/2017  PT End of Session - 11/03/17 0957    Visit Number  5    Number of Visits  13    Date for PT Re-Evaluation  11/30/17    Authorization Type  Medicare Part A/B primary; [employer] Secondary (PT's ONLY, no auth required, no visit limit)    Authorization Time Period  10/19/17 - 12/03/17    Authorization - Visit Number  5    Authorization - Number of Visits  10    PT Start Time  0906    PT Stop Time  0946    PT Time Calculation (min)  40 min    Activity Tolerance  Patient tolerated treatment well    Behavior During Therapy  [facility] for tasks assessed/performed       Past Medical History:  Diagnosis Date  . Anxiety   . Arthritis   . Arthritis   . Asthma   . Bipolar 2 disorder ([place])   . Cancer (HCC)    Basal Cell  . Constipation   . Depression   . Fibromyalgia   . GERD (gastroesophageal reflux disease)   . Heart murmur    As small child   . History of gout   . History of skin cancer   . HNP (herniated nucleus pulposus with myelopathy), thoracic   . Hypothyroidism   . Left eye injury    In ED 06/17/2014   . Low BP   . Migraines   . OCD (obsessive compulsive disorder)   . Osteoporosis   . Psoriasis   . Scoliosis     Past Surgical History:  Procedure Laterality Date  . Arthroscopic left knee surgery    . BIOPSY  10/28/2017   Procedure: BIOPSY;  Surgeon: Danie Binder, MD;  [facility];  Service: Endoscopy;;  duodenal biopsy , gastric biopsy   . DILATION AND CURETTAGE OF UTERUS  x3  . ESOPHAGOGASTRODUODENOSCOPY (EGD) WITH PROPOFOL N/A 10/28/2017   Procedure: ESOPHAGOGASTRODUODENOSCOPY (EGD) WITH PROPOFOL;  Surgeon: Danie Binder, MD;  [facility];  Service: Endoscopy;  Laterality: N/A;  8:15am  . HIP SURGERY Right 2015  . iliac wings    . KNEE SURGERY Bilateral 2015, 2016  . MANDIBLE SURGERY     X 2  . PARTIAL KNEE ARTHROPLASTY Left 02/12/2014   Procedure: LEFT KNEE UNICOMPARTMENTAL MEDIALLY ARTHROPLASTY ;  Surgeon: Mauri Pole, MD;  [facility];  Service: Orthopedics;  Laterality: Left;  . PARTIAL KNEE ARTHROPLASTY Right 07/02/2014   Procedure: RIGHT UNI KNEE ARTHOPLASTY MEDIALLY;  Surgeon: Paralee Cancel, MD;  Location: [facility];  Service: Orthopedics;  Laterality: Right;  . rod in spine     scoliosis   . TONSILLECTOMY    . TOTAL HIP ARTHROPLASTY Right 05/09/2013   Procedure: RIGHT TOTAL HIP ARTHROPLASTY ANTERIOR APPROACH;  Surgeon: Mauri Pole, MD;  [facility]: [facility];  Service: Orthopedics;  Laterality: Right;  . TUBAL LIGATION    . WRIST SURGERY      There were no vitals filed for this visit.  Subjective Assessment - 11/03/17 0908    Subjective  Patient reports she is doing well today and not in pain at start of session. She states she  believes her knees wouldn't hurt at ll if she could take her voltaren again.    Pertinent History  T1-S1 scoliosis spine surgery with rods, Rt THA, Rt and Lt lateral unicondylar knee replacement    Limitations  Lifting;Standing;Walking;House hold activities    Diagnostic tests  Plan to have MRI this Friday 10/22/17 for Rt hip = MD concerned there could be gluteus tear    Currently in Pain?  No/denies       [facility] Adult PT Treatment/Exercise - 11/03/17 0001      Exercises   Exercises  Knee/Hip      Knee/Hip Exercises: Stretches   Active Hamstring Stretch  Right;Left;3 reps;30 seconds    Active Hamstring Stretch Limitations  12" box       Knee/Hip Exercises: Standing   Forward Lunges  Both;1 set;15 reps    Lateral Step Up  Both;1 set;15 reps;Hand Hold: 0;Step Height: 4"    Forward Step Up  Both;1 set;15 reps;Hand Hold: 0;Step Height: 4"      Knee/Hip Exercises: Supine    Bridges  Strengthening;Both;15 reps;1 set    Straight Leg Raises  Both;1 set;15 reps      Knee/Hip Exercises: Sidelying   Clams  1x 15 reps, Bil LE; 1x 15 reps reverse clamshell bil LE (no theraband)      Modalities   Modalities  Iontophoresis      Iontophoresis   Type of Iontophoresis  Dexamethasone    Location  Rt knee    Dose  4mg /mL   27mL dose   Time  4 hours - iontoPatchSTAT (4-6hour wear time)        PT Education - 11/03/17 1130    Education Details  Edcuated on iontophoresis patch to be worn for 4-6 hours. Instructed patient to soak patch with soap and water before taking patch off to prevent skin irritation. Instructed to moisturize following removal.    Person(s) Educated  Patient    Methods  Explanation    Comprehension  Verbalized understanding       PT Short Term Goals - 10/20/17 0911      PT SHORT TERM GOAL #1   Title  Patient will be independent with HEP, updated PRN, to improve strength and functional mobility as well as reduce pain.    Time  2    Period  Weeks    Status  On-going      PT SHORT TERM GOAL #2   Title  Patient will improve MMT for limited group by 1/2 grade to demonstrate improved LE strength to reduce    Time  3    Period  Weeks    Status  On-going        PT Long Term Goals - 10/20/17 9373      PT LONG TERM GOAL #1   Title  Patient will perform 10 squats with normal depth, no early heel rise, and no excessive weight shift to right/equal symmetrical weight bearing to demonstrate improved mechanics and flexibility/ROM.     Time  6    Period  Weeks    Status  On-going      PT LONG TERM GOAL #2   Title  Patient will improve MMT to 5/5 for bil LE throughout all muscle groups to demonstrate improved functional LE strength.     Time  6    Period  Weeks    Status  On-going      PT LONG TERM GOAL #3   Title  Patient  will achieve resting knee flexion of 90 degrees or greater to demonstrate improve hip flexor and quadriceps length to  improve flexibility for better mechanics with functional mobility.    Time  6    Period  Weeks    Status  On-going        Plan - 11/03/17 1132    Clinical Impression Statement  Patient arrived with less pain compared to prior session. She is still not taking Voltaren and continues to state this may be contributing to her increased pain overall. She advanced exercises this session to standing for bil LE strengthening. She did report 1 episode of dizziness following step ups that resolved with seated rest break. She informed the PT that she had cataract surgery on Monday and that her surgeon told her she may have some dizziness due to her eyes recovering. She had an increased in knee pain from 0/10 to 5/10 after standing exercises and therefore iontophoresis patchSTAT was applied to address pain. Patient was instructed to wear the patch for 4 hours and remove with soap and water. She will continue to benefit from skilled PT interventions to address impairments and progress flexibility and strength.    Rehab Potential  Fair    PT Frequency  2x / week    PT Duration  6 weeks    PT Treatment/Interventions  ADLs/Self Care Home Management;Aquatic Therapy;Cryotherapy;Electrical Stimulation;Iontophoresis 4mg /ml Dexamethasone;Moist Heat;DME Instruction;Gait training;Stair training;Functional mobility training;Therapeutic activities;Therapeutic exercise;Balance training;Neuromuscular re-education;Patient/family education;Manual techniques;Passive range of motion;Dry needling    PT Next Visit Plan  Follow up on iontophoresis patch. Continue hip strengthening and perform soft tissue mobilization to lateral right thigh PRN and joint mobility to Rt knee for pain reduction. Progress exercise to standing to address Rt LE strength and knee mobility.     PT Home Exercise Plan  Eval: thomas stretch for hip flexor; 8/20: prone quad stretch with rope    Consulted and Agree with Plan of Care  Patient       Patient will  benefit from skilled therapeutic intervention in order to improve the following deficits and impairments:  Abnormal gait, Decreased activity tolerance, Decreased endurance, Decreased range of motion, Decreased strength, Decreased balance, Decreased mobility, Difficulty walking, Increased muscle spasms, Impaired flexibility, Pain, Improper body mechanics, Postural dysfunction  Visit Diagnosis: Chronic pain of right knee  Pain in right hip  Muscle weakness (generalized)     Problem List Patient Active Problem List   Diagnosis Date Noted  . Dyspepsia   . Epigastric pain 09/18/2015  . FH: colon cancer 09/18/2015  . Osteoporosis 05/20/2015  . Moderate single current episode of major depressive disorder ([place]) 02/18/2015  . S/P right UKR 07/02/2014  . Status post unilateral knee replacement 07/02/2014  . Asthma, mild intermittent 02/06/2014  . Lung nodule 02/06/2014  . Hyperlipidemia 01/18/2014  . Expected blood loss anemia 05/10/2013  . Overweight (BMI 25.0-29.9) 05/10/2013  . S/P right THA, AA 05/09/2013  . Pre-operative respiratory examination 05/07/2013  . Post-nasal drainage 05/07/2013  . Endometrial polyp 02/28/2013  . Osteoarthritis of right hip 02/15/2013  . Precordial pain 12/07/2012  . Chronic back pain 08/17/2012  . Hypothyroid 08/17/2012  . Osteopenia 08/17/2012  . Hypersomnia 11/18/2011  . Asthma, mild persistent 08/05/2011  . Memory change 08/05/2011  . TIA (transient ischemic attack) 08/05/2011  . Pulmonary nodule 08/05/2011  . SOB (shortness of breath) 01/05/2011    Kipp Brood, PT, DPT Physical Therapist with [facility]  11/03/2017 11:38 AM  [place], [place], [address] Phone: [phone]   Fax:  [phone]  Name: Chrystian Ressler MRN: [id number] Date of Birth: 21-Jun-1945

## 2017-11-04 ENCOUNTER — Telehealth: Payer: Self-pay | Admitting: Gastroenterology

## 2017-11-04 NOTE — Telephone Encounter (Signed)
Pt has EGD on 10/28/2017. Forwarding to Dr. Oneida Alar for results.

## 2017-11-04 NOTE — Telephone Encounter (Signed)
Pt calling for results. [phone]

## 2017-11-04 NOTE — Telephone Encounter (Signed)
See TC AUG 23.

## 2017-11-04 NOTE — Telephone Encounter (Signed)
LMOM for a return call.  

## 2017-11-05 ENCOUNTER — Encounter (HOSPITAL_COMMUNITY): Payer: Self-pay

## 2017-11-05 ENCOUNTER — Ambulatory Visit (HOSPITAL_COMMUNITY): Payer: Medicare Other

## 2017-11-05 DIAGNOSIS — M25551 Pain in right hip: Secondary | ICD-10-CM

## 2017-11-05 DIAGNOSIS — M25561 Pain in right knee: Secondary | ICD-10-CM | POA: Diagnosis not present

## 2017-11-05 DIAGNOSIS — G8929 Other chronic pain: Secondary | ICD-10-CM

## 2017-11-05 DIAGNOSIS — M6281 Muscle weakness (generalized): Secondary | ICD-10-CM | POA: Diagnosis not present

## 2017-11-05 DIAGNOSIS — R2689 Other abnormalities of gait and mobility: Secondary | ICD-10-CM

## 2017-11-05 NOTE — Telephone Encounter (Signed)
LMOM for a return call.  

## 2017-11-05 NOTE — Therapy (Signed)
[place], [place], [address] Phone: [phone]   Fax:  [phone]  Physical Therapy Treatment  Patient Details  Name: Jenna Contreras MRN: [id number] Date of Birth: 02/07/1946 Referring Provider: Paralee Cancel, MD   Encounter Date: 11/05/2017  PT End of Session - 11/05/17 1035    Visit Number  6    Number of Visits  13    Date for PT Re-Evaluation  11/30/17    Authorization Type  Medicare Part A/B primary; [employer] Secondary (PT's ONLY, no auth required, no visit limit)    Authorization Time Period  10/19/17 - 12/03/17    Authorization - Visit Number  6    Authorization - Number of Visits  10    PT Start Time  1033    PT Stop Time  1113    PT Time Calculation (min)  40 min    Activity Tolerance  Patient tolerated treatment well    Behavior During Therapy  [facility] for tasks assessed/performed       Past Medical History:  Diagnosis Date  . Anxiety   . Arthritis   . Arthritis   . Asthma   . Bipolar 2 disorder ([place])   . Cancer (HCC)    Basal Cell  . Constipation   . Depression   . Fibromyalgia   . GERD (gastroesophageal reflux disease)   . Heart murmur    As small child   . History of gout   . History of skin cancer   . HNP (herniated nucleus pulposus with myelopathy), thoracic   . Hypothyroidism   . Left eye injury    In ED 06/17/2014   . Low BP   . Migraines   . OCD (obsessive compulsive disorder)   . Osteoporosis   . Psoriasis   . Scoliosis     Past Surgical History:  Procedure Laterality Date  . Arthroscopic left knee surgery    . BIOPSY  10/28/2017   Procedure: BIOPSY;  Surgeon: Danie Binder, MD;  [facility];  Service: Endoscopy;;  duodenal biopsy , gastric biopsy   . DILATION AND CURETTAGE OF UTERUS  x3  . ESOPHAGOGASTRODUODENOSCOPY (EGD) WITH PROPOFOL N/A 10/28/2017   Procedure: ESOPHAGOGASTRODUODENOSCOPY (EGD) WITH PROPOFOL;  Surgeon: Danie Binder, MD;  [facility];  Service: Endoscopy;  Laterality: N/A;  8:15am  . HIP SURGERY Right 2015  . iliac wings    . KNEE SURGERY Bilateral 2015, 2016  . MANDIBLE SURGERY     X 2  . PARTIAL KNEE ARTHROPLASTY Left 02/12/2014   Procedure: LEFT KNEE UNICOMPARTMENTAL MEDIALLY ARTHROPLASTY ;  Surgeon: Mauri Pole, MD;  [facility];  Service: Orthopedics;  Laterality: Left;  . PARTIAL KNEE ARTHROPLASTY Right 07/02/2014   Procedure: RIGHT UNI KNEE ARTHOPLASTY MEDIALLY;  Surgeon: Paralee Cancel, MD;  [facility];  Service: Orthopedics;  Laterality: Right;  . rod in spine     scoliosis   . TONSILLECTOMY    . TOTAL HIP ARTHROPLASTY Right 05/09/2013   Procedure: RIGHT TOTAL HIP ARTHROPLASTY ANTERIOR APPROACH;  Surgeon: Mauri Pole, MD;  [facility];  Service: Orthopedics;  Laterality: Right;  . TUBAL LIGATION    . WRIST SURGERY      There were no vitals filed for this visit.  Subjective Assessment - 11/05/17 1035    Subjective  Pt states that the ionto significantly helped her R knee pain. It initially started burning after about 15  mins so she almost took it off but she left it on any stated that it really helped. She states that it did not burn or leave a blister afterwards. Her L leg was bothering her last night.     Pertinent History  T1-S1 scoliosis spine surgery with rods, Rt THA, Rt and Lt lateral unicondylar knee replacement    Limitations  Lifting;Standing;Walking;House hold activities    Diagnostic tests  Plan to have MRI this Friday 10/22/17 for Rt hip = MD concerned there could be gluteus tear    Currently in Pain?  Yes    Pain Score  6     Pain Location  Hip    Pain Orientation  Left    Pain Descriptors / Indicators  Aching;Stabbing    Pain Type  Chronic pain    Pain Radiating Towards  down into knee    Pain Onset  More than a month ago    Pain Frequency  Constant    Aggravating Factors   squatting, yardwork    Pain Relieving Factors  unsure    Effect of Pain on Daily  Activities  moderate            [facility] Adult PT Treatment/Exercise - 11/05/17 0001      Exercises   Exercises  Knee/Hip      Knee/Hip Exercises: Standing   Other Standing Knee Exercises  bil tandem stance on foam +palov press with RTB x10 reps each      Knee/Hip Exercises: Supine   Bridges  Both;2 sets;15 reps    Bridges Limitations  RTB around knees      Knee/Hip Exercises: Sidelying   Clams  RTB 2x15 reps BLE    Other Sidelying Knee/Hip Exercises  reverse clams bil 2x15 reps (no theraband)      Knee/Hip Exercises: Prone   Other Prone Exercises  quadruped birddogs x10 reps each    Other Prone Exercises  quadruped firehydrants x10 reps each             PT Education - 11/05/17 1035    Education Details  continue HEP, ionto patch education    Person(s) Educated  Patient    Methods  Explanation;Demonstration    Comprehension  Verbalized understanding;Returned demonstration       PT Short Term Goals - 10/20/17 0911      PT SHORT TERM GOAL #1   Title  Patient will be independent with HEP, updated PRN, to improve strength and functional mobility as well as reduce pain.    Time  2    Period  Weeks    Status  On-going      PT SHORT TERM GOAL #2   Title  Patient will improve MMT for limited group by 1/2 grade to demonstrate improved LE strength to reduce    Time  3    Period  Weeks    Status  On-going        PT Long Term Goals - 10/20/17 1962      PT LONG TERM GOAL #1   Title  Patient will perform 10 squats with normal depth, no early heel rise, and no excessive weight shift to right/equal symmetrical weight bearing to demonstrate improved mechanics and flexibility/ROM.     Time  6    Period  Weeks    Status  On-going      PT LONG TERM GOAL #2   Title  Patient will improve MMT to 5/5 for bil LE throughout all  muscle groups to demonstrate improved functional LE strength.     Time  6    Period  Weeks    Status  On-going      PT LONG TERM GOAL #3   Title   Patient will achieve resting knee flexion of 90 degrees or greater to demonstrate improve hip flexor and quadriceps length to improve flexibility for better mechanics with functional mobility.    Time  6    Period  Weeks    Status  On-going            Plan - 11/05/17 1115    Clinical Impression Statement  Pt presenting to therapy reporting that the ionto really helped her R knee and it abolished her pain. Pt mainly c/o L hip and leg pain today. Since pt did not have any R knee pain, did not apply ionto today; will reapply in future sessions as necessary. Today's session focused on glute and functional strengthening in order to decrease overall pain. Min cues required for proper exercise technique throughout session. Able to add quadruped birddogs and firehydrants for core and hip abd strength (pt required airex pad for knees due to pain with direct pressure). Began tandem stance on foam +palov press to engage and improve core strength as well as functional hip strength. Pt reported feeling like she had a good workout at [facility], no increased pain, just soreness. Continue as planned, progressing as able and f/u with potentially adding ionto to L knee for pain.    Rehab Potential  Fair    PT Frequency  2x / week    PT Duration  6 weeks    PT Treatment/Interventions  ADLs/Self Care Home Management;Aquatic Therapy;Cryotherapy;Electrical Stimulation;Iontophoresis 4mg /ml Dexamethasone;Moist Heat;DME Instruction;Gait training;Stair training;Functional mobility training;Therapeutic activities;Therapeutic exercise;Balance training;Neuromuscular re-education;Patient/family education;Manual techniques;Passive range of motion;Dry needling    PT Next Visit Plan  Follow up on iontophoresis patch, apply to L knee if able. Continue hip strengthening and perform soft tissue mobilization to lateral right thigh PRN and joint mobility to Rt knee for pain reduction. Progress exercise to standing to address Rt LE strength  and knee mobility.     PT Home Exercise Plan  Eval: thomas stretch for hip flexor; 8/20: prone quad stretch with rope    Consulted and Agree with Plan of Care  Patient       Patient will benefit from skilled therapeutic intervention in order to improve the following deficits and impairments:  Abnormal gait, Decreased activity tolerance, Decreased endurance, Decreased range of motion, Decreased strength, Decreased balance, Decreased mobility, Difficulty walking, Increased muscle spasms, Impaired flexibility, Pain, Improper body mechanics, Postural dysfunction  Visit Diagnosis: Chronic pain of right knee  Pain in right hip  Muscle weakness (generalized)  Other abnormalities of gait and mobility     Problem List Patient Active Problem List   Diagnosis Date Noted  . Dyspepsia   . Epigastric pain 09/18/2015  . FH: colon cancer 09/18/2015  . Osteoporosis 05/20/2015  . Moderate single current episode of major depressive disorder ([place]) 02/18/2015  . S/P right UKR 07/02/2014  . Status post unilateral knee replacement 07/02/2014  . Asthma, mild intermittent 02/06/2014  . Lung nodule 02/06/2014  . Hyperlipidemia 01/18/2014  . Expected blood loss anemia 05/10/2013  . Overweight (BMI 25.0-29.9) 05/10/2013  . S/P right THA, AA 05/09/2013  . Pre-operative respiratory examination 05/07/2013  . Post-nasal drainage 05/07/2013  . Endometrial polyp 02/28/2013  . Osteoarthritis of right hip 02/15/2013  . Precordial pain 12/07/2012  .  Chronic back pain 08/17/2012  . Hypothyroid 08/17/2012  . Osteopenia 08/17/2012  . Hypersomnia 11/18/2011  . Asthma, mild persistent 08/05/2011  . Memory change 08/05/2011  . TIA (transient ischemic attack) 08/05/2011  . Pulmonary nodule 08/05/2011  . SOB (shortness of breath) 01/05/2011       Geraldine Solar PT, DPT  [place] [address] [place], [place], [address] Phone: [phone]   Fax:   [phone]  Name: Jenna Contreras MRN: [id number] Date of Birth: 08-11-1945

## 2017-11-09 ENCOUNTER — Ambulatory Visit (HOSPITAL_COMMUNITY): Payer: Medicare Other

## 2017-11-09 ENCOUNTER — Telehealth (HOSPITAL_COMMUNITY): Payer: Self-pay

## 2017-11-09 ENCOUNTER — Telehealth: Payer: Self-pay | Admitting: Gastroenterology

## 2017-11-09 NOTE — Telephone Encounter (Signed)
Her husband is sick and she needed to cancel for today

## 2017-11-09 NOTE — Telephone Encounter (Signed)
See previous note, pt aware.

## 2017-11-09 NOTE — Telephone Encounter (Signed)
Pt is aware again. She remembers now, and is scheduled for her CT on 11/17/2017.

## 2017-11-09 NOTE — Telephone Encounter (Signed)
Patient returned call, please call back  

## 2017-11-11 ENCOUNTER — Other Ambulatory Visit: Payer: Self-pay

## 2017-11-11 ENCOUNTER — Encounter (HOSPITAL_COMMUNITY): Payer: Self-pay

## 2017-11-11 ENCOUNTER — Ambulatory Visit (HOSPITAL_COMMUNITY): Payer: Medicare Other | Attending: Orthopedic Surgery

## 2017-11-11 DIAGNOSIS — M25551 Pain in right hip: Secondary | ICD-10-CM

## 2017-11-11 DIAGNOSIS — M6281 Muscle weakness (generalized): Secondary | ICD-10-CM | POA: Diagnosis not present

## 2017-11-11 DIAGNOSIS — M25561 Pain in right knee: Secondary | ICD-10-CM | POA: Diagnosis not present

## 2017-11-11 DIAGNOSIS — R2689 Other abnormalities of gait and mobility: Secondary | ICD-10-CM | POA: Diagnosis not present

## 2017-11-11 DIAGNOSIS — G8929 Other chronic pain: Secondary | ICD-10-CM | POA: Diagnosis not present

## 2017-11-11 NOTE — Therapy (Signed)
[place], [place], [address] Phone: [phone]   Fax:  [phone]  Physical Therapy Treatment  Patient Details  Name: Jenna Contreras MRN: [id number] Date of Birth: September 05, 1945 Referring Provider: Paralee Cancel, MD   Encounter Date: 11/11/2017  PT End of Session - 11/11/17 1052    Visit Number  7    Number of Visits  13    Date for PT Re-Evaluation  11/30/17    Authorization Type  Medicare Part A/B primary; [employer] Secondary (PT's ONLY, no auth required, no visit limit)    Authorization Time Period  10/19/17 - 12/03/17    Authorization - Visit Number  7    Authorization - Number of Visits  10    PT Start Time  0175    PT Stop Time  1120    PT Time Calculation (min)  41 min    Activity Tolerance  Patient tolerated treatment well    Behavior During Therapy  [facility] for tasks assessed/performed       Past Medical History:  Diagnosis Date  . Anxiety   . Arthritis   . Arthritis   . Asthma   . Bipolar 2 disorder ([place])   . Cancer (HCC)    Basal Cell  . Constipation   . Depression   . Fibromyalgia   . GERD (gastroesophageal reflux disease)   . Heart murmur    As small child   . History of gout   . History of skin cancer   . HNP (herniated nucleus pulposus with myelopathy), thoracic   . Hypothyroidism   . Left eye injury    In ED 06/17/2014   . Low BP   . Migraines   . OCD (obsessive compulsive disorder)   . Osteoporosis   . Psoriasis   . Scoliosis     Past Surgical History:  Procedure Laterality Date  . Arthroscopic left knee surgery    . BIOPSY  10/28/2017   Procedure: BIOPSY;  Surgeon: Danie Binder, MD;  [facility];  Service: Endoscopy;;  duodenal biopsy , gastric biopsy   . DILATION AND CURETTAGE OF UTERUS  x3  . ESOPHAGOGASTRODUODENOSCOPY (EGD) WITH PROPOFOL N/A 10/28/2017   Procedure: ESOPHAGOGASTRODUODENOSCOPY (EGD) WITH PROPOFOL;  Surgeon: Danie Binder, MD;  [facility];  Service: Endoscopy;  Laterality: N/A;  8:15am  . HIP SURGERY Right 2015  . iliac wings    . KNEE SURGERY Bilateral 2015, 2016  . MANDIBLE SURGERY     X 2  . PARTIAL KNEE ARTHROPLASTY Left 02/12/2014   Procedure: LEFT KNEE UNICOMPARTMENTAL MEDIALLY ARTHROPLASTY ;  Surgeon: Mauri Pole, MD;  [facility];  Service: Orthopedics;  Laterality: Left;  . PARTIAL KNEE ARTHROPLASTY Right 07/02/2014   Procedure: RIGHT UNI KNEE ARTHOPLASTY MEDIALLY;  Surgeon: Paralee Cancel, MD;  [facility];  Service: Orthopedics;  Laterality: Right;  . rod in spine     scoliosis   . TONSILLECTOMY    . TOTAL HIP ARTHROPLASTY Right 05/09/2013   Procedure: RIGHT TOTAL HIP ARTHROPLASTY ANTERIOR APPROACH;  Surgeon: Mauri Pole, MD;  [facility];  Service: Orthopedics;  Laterality: Right;  . TUBAL LIGATION    . WRIST SURGERY      There were no vitals filed for this visit.  Subjective Assessment - 11/11/17 1040    Subjective  Patient states again that the ionto helped with her knee a lot and reports that she is still  having some knee and hip pain today. She discusses her eventful day on Tuesday with her husband. she reprots she had to call 911 for her husband as he collapsed and [facility] was taken to [facility]. She states he is home now.    Pertinent History  T1-S1 scoliosis spine surgery with rods, Rt THA, Rt and Lt lateral unicondylar knee replacement    Limitations  Lifting;Standing;Walking;House hold activities    Diagnostic tests  Plan to have MRI this Friday 10/22/17 for Rt hip = MD concerned there could be gluteus tear    Currently in Pain?  Yes    Pain Score  3     Pain Location  Knee    Pain Orientation  Right    Pain Descriptors / Indicators  Aching    Pain Onset  More than a month ago    Pain Frequency  Constant    Multiple Pain Sites  Yes    Pain Score  5    Pain Location  Hip    Pain Orientation  Right    Pain Descriptors / Indicators  Aching    Pain Type  Chronic  pain       [facility] Adult PT Treatment/Exercise - 11/11/17 0001      Exercises   Exercises  Knee/Hip      Knee/Hip Exercises: Supine   Bridges  Both;2 sets;15 reps    Bridges Limitations  RTB around knees      Knee/Hip Exercises: Sidelying   Clams  RTB 2x15 reps BLE    Other Sidelying Knee/Hip Exercises  reverse clams bil 1x15 reps (red TB above ankles)      Knee/Hip Exercises: Prone   Other Prone Exercises  quadruped birddogs x10 reps each    Other Prone Exercises  quadruped firehydrants x10 reps each      Modalities   Modalities  Iontophoresis      Iontophoresis   Type of Iontophoresis  Dexamethasone    Location  Rt knee    Dose  4mg /mL    Time  4 hours - iontoPatchSTAT (4-6hour wear time)      Manual Therapy   Manual Therapy  Joint mobilization    Manual therapy comments  completed separate rest of treatment    Joint Mobilization  3x 30-45 seconds grade I/II, AP glid to Rt/Lt tibiofemoral joint for pain relief       PT Education - 11/11/17 1052    Education Details  Educated on exercise throughout and on time duration for patch.     Person(s) Educated  Patient    Methods  Explanation    Comprehension  Verbalized understanding       PT Short Term Goals - 10/20/17 0911      PT SHORT TERM GOAL #1   Title  Patient will be independent with HEP, updated PRN, to improve strength and functional mobility as well as reduce pain.    Time  2    Period  Weeks    Status  On-going      PT SHORT TERM GOAL #2   Title  Patient will improve MMT for limited group by 1/2 grade to demonstrate improved LE strength to reduce    Time  3    Period  Weeks    Status  On-going        PT Long Term Goals - 10/20/17 9629      PT LONG TERM GOAL #1   Title  Patient will perform 10  squats with normal depth, no early heel rise, and no excessive weight shift to right/equal symmetrical weight bearing to demonstrate improved mechanics and flexibility/ROM.     Time  6    Period  Weeks     Status  On-going      PT LONG TERM GOAL #2   Title  Patient will improve MMT to 5/5 for bil LE throughout all muscle groups to demonstrate improved functional LE strength.     Time  6    Period  Weeks    Status  On-going      PT LONG TERM GOAL #3   Title  Patient will achieve resting knee flexion of 90 degrees or greater to demonstrate improve hip flexor and quadriceps length to improve flexibility for better mechanics with functional mobility.    Time  6    Period  Weeks    Status  On-going       Plan - 11/11/17 1052    Clinical Impression Statement  Patient arrived reporting the ionto really helped her knee pain and she would like to try that again. She reports after her last session she has been sore at her right hip and left knee as well and thinks it might be helpful to try ionto on her Lt knee. She continued this session with hip and core strengthening and continued bird dogs and fire hydrant exercise. EOS performed AP glide to bil knees for pain relief and applied ionto to Rt knee. Will contact MD to request approval for Lt knee treatment with ionto as well. She will continue to benefit from skilled PT interventions to address impairments and progress flexibility and strength.    Rehab Potential  Fair    PT Frequency  2x / week    PT Duration  6 weeks    PT Treatment/Interventions  ADLs/Self Care Home Management;Aquatic Therapy;Cryotherapy;Electrical Stimulation;Iontophoresis 4mg /ml Dexamethasone;Moist Heat;DME Instruction;Gait training;Stair training;Functional mobility training;Therapeutic activities;Therapeutic exercise;Balance training;Neuromuscular re-education;Patient/family education;Manual techniques;Passive range of motion;Dry needling    PT Next Visit Plan  Follow up on iontophoresis patch, apply to L knee if able. Continue hip strengthening and perform soft tissue mobilization to lateral right thigh PRN and joint mobility to Bil knees for pain reduction. Progress exercise to  standing for balance and functional strengthening.     PT Home Exercise Plan  Eval: thomas stretch for hip flexor; 8/20: prone quad stretch with rope    Consulted and Agree with Plan of Care  Patient       Patient will benefit from skilled therapeutic intervention in order to improve the following deficits and impairments:  Abnormal gait, Decreased activity tolerance, Decreased endurance, Decreased range of motion, Decreased strength, Decreased balance, Decreased mobility, Difficulty walking, Increased muscle spasms, Impaired flexibility, Pain, Improper body mechanics, Postural dysfunction  Visit Diagnosis: Chronic pain of right knee  Pain in right hip  Muscle weakness (generalized)  Other abnormalities of gait and mobility     Problem List Patient Active Problem List   Diagnosis Date Noted  . Dyspepsia   . Epigastric pain 09/18/2015  . FH: colon cancer 09/18/2015  . Osteoporosis 05/20/2015  . Moderate single current episode of major depressive disorder ([place]) 02/18/2015  . S/P right UKR 07/02/2014  . Status post unilateral knee replacement 07/02/2014  . Asthma, mild intermittent 02/06/2014  . Lung nodule 02/06/2014  . Hyperlipidemia 01/18/2014  . Expected blood loss anemia 05/10/2013  . Overweight (BMI 25.0-29.9) 05/10/2013  . S/P right THA, AA 05/09/2013  . Pre-operative  respiratory examination 05/07/2013  . Post-nasal drainage 05/07/2013  . Endometrial polyp 02/28/2013  . Osteoarthritis of right hip 02/15/2013  . Precordial pain 12/07/2012  . Chronic back pain 08/17/2012  . Hypothyroid 08/17/2012  . Osteopenia 08/17/2012  . Hypersomnia 11/18/2011  . Asthma, mild persistent 08/05/2011  . Memory change 08/05/2011  . TIA (transient ischemic attack) 08/05/2011  . Pulmonary nodule 08/05/2011  . SOB (shortness of breath) 01/05/2011    Kipp Brood, PT, DPT Physical Therapist with [facility]  11/11/2017 11:30 AM    [place] [address] [place], [place], [address] Phone: [phone]   Fax:  [phone]  Name: Jenna Contreras MRN: [id number] Date of Birth: 06/26/1945

## 2017-11-13 DIAGNOSIS — H2511 Age-related nuclear cataract, right eye: Secondary | ICD-10-CM | POA: Diagnosis not present

## 2017-11-15 DIAGNOSIS — H2512 Age-related nuclear cataract, left eye: Secondary | ICD-10-CM | POA: Diagnosis not present

## 2017-11-15 DIAGNOSIS — Z961 Presence of intraocular lens: Secondary | ICD-10-CM | POA: Diagnosis not present

## 2017-11-15 DIAGNOSIS — H18231 Secondary corneal edema, right eye: Secondary | ICD-10-CM | POA: Diagnosis not present

## 2017-11-15 DIAGNOSIS — H40051 Ocular hypertension, right eye: Secondary | ICD-10-CM | POA: Diagnosis not present

## 2017-11-15 DIAGNOSIS — H2511 Age-related nuclear cataract, right eye: Secondary | ICD-10-CM | POA: Diagnosis not present

## 2017-11-15 DIAGNOSIS — H5711 Ocular pain, right eye: Secondary | ICD-10-CM | POA: Diagnosis not present

## 2017-11-15 DIAGNOSIS — H35363 Drusen (degenerative) of macula, bilateral: Secondary | ICD-10-CM | POA: Diagnosis not present

## 2017-11-15 DIAGNOSIS — S0501XA Injury of conjunctiva and corneal abrasion without foreign body, right eye, initial encounter: Secondary | ICD-10-CM | POA: Diagnosis not present

## 2017-11-15 DIAGNOSIS — H53141 Visual discomfort, right eye: Secondary | ICD-10-CM | POA: Diagnosis not present

## 2017-11-16 ENCOUNTER — Ambulatory Visit (HOSPITAL_COMMUNITY): Payer: Medicare Other

## 2017-11-16 ENCOUNTER — Telehealth: Payer: Self-pay | Admitting: Family Medicine

## 2017-11-16 ENCOUNTER — Telehealth (HOSPITAL_COMMUNITY): Payer: Self-pay | Admitting: Family Medicine

## 2017-11-16 DIAGNOSIS — H02052 Trichiasis without entropian right lower eyelid: Secondary | ICD-10-CM | POA: Diagnosis not present

## 2017-11-16 NOTE — Telephone Encounter (Signed)
Nurses-please talk with the patient get a feel for how often she is want to take this medication also find out which pharmacy she wants to use I will look into this later this evening

## 2017-11-16 NOTE — Telephone Encounter (Signed)
Patient states she had a rx of this in the past and it told her to take up to three daily prn. [facility].

## 2017-11-16 NOTE — Telephone Encounter (Signed)
Patient is requesting refill on hydrocodone 10/325 °

## 2017-11-16 NOTE — Telephone Encounter (Signed)
11/16/17  pt called to cx said that she had eye surgery yesterday and [facility] and still having pain this morning

## 2017-11-17 ENCOUNTER — Ambulatory Visit (HOSPITAL_COMMUNITY)
Admission: RE | Admit: 2017-11-17 | Discharge: 2017-11-17 | Disposition: A | Discharge status: Home / Self Care | Payer: Medicare Other | Source: Ambulatory Visit | Attending: Gastroenterology | Admitting: Gastroenterology

## 2017-11-17 ENCOUNTER — Other Ambulatory Visit: Payer: Self-pay | Admitting: Family Medicine

## 2017-11-17 DIAGNOSIS — R1013 Epigastric pain: Secondary | ICD-10-CM | POA: Diagnosis not present

## 2017-11-17 DIAGNOSIS — I700 Atherosclerosis of aorta: Secondary | ICD-10-CM | POA: Insufficient documentation

## 2017-11-17 DIAGNOSIS — R1011 Right upper quadrant pain: Secondary | ICD-10-CM | POA: Diagnosis not present

## 2017-11-17 MED ORDER — IOPAMIDOL (ISOVUE-300) INJECTION 61%
100.0000 mL | Freq: Once | INTRAVENOUS | Status: AC | PRN
  Administered 2017-11-17: 100 mL via INTRAVENOUS

## 2017-11-17 MED ORDER — HYDROCODONE-ACETAMINOPHEN 10-325 MG PO TABS: 1.0000 | ORAL_TABLET | Freq: Three times a day (TID) | ORAL | 0 refills | Status: DC | PRN

## 2017-11-17 NOTE — Telephone Encounter (Signed)
Please move the appointment up to be around October 1-October 1-5

## 2017-11-17 NOTE — Telephone Encounter (Signed)
Left message to return call 

## 2017-11-17 NOTE — Telephone Encounter (Signed)
Pt has pain management appt OCT 25. Should we move this appt up? Please advise.

## 2017-11-17 NOTE — Telephone Encounter (Signed)
Contacted patient and patient has moved appointment to Oct 9

## 2017-11-17 NOTE — Telephone Encounter (Signed)
A prescription for 60 tablets was sent to [facility] 1 3 times daily as needed She will need to do a follow-up pain management visit in order for [place] to do her regular scripts It would be wise for the patient to go ahead and schedule often we are booked out weeks in advance Should consider following up within 3 to 5 weeks

## 2017-11-18 ENCOUNTER — Encounter (HOSPITAL_COMMUNITY): Payer: Self-pay

## 2017-11-18 ENCOUNTER — Other Ambulatory Visit: Payer: Self-pay

## 2017-11-18 ENCOUNTER — Ambulatory Visit (HOSPITAL_COMMUNITY): Payer: Medicare Other

## 2017-11-18 DIAGNOSIS — M6281 Muscle weakness (generalized): Secondary | ICD-10-CM

## 2017-11-18 DIAGNOSIS — R2689 Other abnormalities of gait and mobility: Secondary | ICD-10-CM | POA: Diagnosis not present

## 2017-11-18 DIAGNOSIS — M25561 Pain in right knee: Secondary | ICD-10-CM | POA: Diagnosis not present

## 2017-11-18 DIAGNOSIS — M25551 Pain in right hip: Secondary | ICD-10-CM

## 2017-11-18 DIAGNOSIS — G8929 Other chronic pain: Secondary | ICD-10-CM

## 2017-11-18 NOTE — Therapy (Signed)
[place], [place], [address] Phone: [phone]   Fax:  [phone]  Physical Therapy Treatment  Patient Details  Name: Jenna Contreras MRN: [id number] Date of Birth: 08/11/1945 Referring Provider: Paralee Cancel, MD   Encounter Date: 11/18/2017  PT End of Session - 11/18/17 1058    Visit Number  8    Number of Visits  13    Date for PT Re-Evaluation  11/30/17    Authorization Type  Medicare Part A/B primary; [employer] Secondary (PT's ONLY, no auth required, no visit limit)    Authorization Time Period  10/19/17 - 12/03/17    Authorization - Visit Number  8    Authorization - Number of Visits  10    PT Start Time  9935    PT Stop Time  1116    PT Time Calculation (min)  41 min    Activity Tolerance  Patient tolerated treatment well    Behavior During Therapy  [facility] for tasks assessed/performed       Past Medical History:  Diagnosis Date  . Anxiety   . Arthritis   . Arthritis   . Asthma   . Bipolar 2 disorder ([place])   . Cancer (HCC)    Basal Cell  . Constipation   . Depression   . Fibromyalgia   . GERD (gastroesophageal reflux disease)   . Heart murmur    As small child   . History of gout   . History of skin cancer   . HNP (herniated nucleus pulposus with myelopathy), thoracic   . Hypothyroidism   . Left eye injury    In ED 06/17/2014   . Low BP   . Migraines   . OCD (obsessive compulsive disorder)   . Osteoporosis   . Psoriasis   . Scoliosis     Past Surgical History:  Procedure Laterality Date  . Arthroscopic left knee surgery    . BIOPSY  10/28/2017   Procedure: BIOPSY;  Surgeon: Danie Binder, MD;  [facility];  Service: Endoscopy;;  duodenal biopsy , gastric biopsy   . DILATION AND CURETTAGE OF UTERUS  x3  . ESOPHAGOGASTRODUODENOSCOPY (EGD) WITH PROPOFOL N/A 10/28/2017   Procedure: ESOPHAGOGASTRODUODENOSCOPY (EGD) WITH PROPOFOL;  Surgeon: Danie Binder, MD;  [facility];  Service: Endoscopy;  Laterality: N/A;  8:15am  . HIP SURGERY Right 2015  . iliac wings    . KNEE SURGERY Bilateral 2015, 2016  . MANDIBLE SURGERY     X 2  . PARTIAL KNEE ARTHROPLASTY Left 02/12/2014   Procedure: LEFT KNEE UNICOMPARTMENTAL MEDIALLY ARTHROPLASTY ;  Surgeon: Mauri Pole, MD;  [facility]: [facility];  Service: Orthopedics;  Laterality: Left;  . PARTIAL KNEE ARTHROPLASTY Right 07/02/2014   Procedure: RIGHT UNI KNEE ARTHOPLASTY MEDIALLY;  Surgeon: Paralee Cancel, MD;  [facility]: [facility];  Service: Orthopedics;  Laterality: Right;  . rod in spine     scoliosis   . TONSILLECTOMY    . TOTAL HIP ARTHROPLASTY Right 05/09/2013   Procedure: RIGHT TOTAL HIP ARTHROPLASTY ANTERIOR APPROACH;  Surgeon: Mauri Pole, MD;  [facility];  Service: Orthopedics;  Laterality: Right;  . TUBAL LIGATION    . WRIST SURGERY      There were no vitals filed for this visit.  Subjective Assessment - 11/18/17 1040    Subjective  Patient reports her eye surgery did not go as well this time and she had pain which is  why she cancelled her appointment earlier this week. She reports she is doing better now. She denies pain in bil knees this date.    Pertinent History  T1-S1 scoliosis spine surgery with rods, Rt THA, Rt and Lt lateral unicondylar knee replacement    Limitations  Lifting;Standing;Walking;House hold activities    Diagnostic tests  Plan to have MRI this Friday 10/22/17 for Rt hip = MD concerned there could be gluteus tear    Currently in Pain?  No/denies        [facility] Adult PT Treatment/Exercise - 11/18/17 0001      Exercises   Exercises  Knee/Hip      Knee/Hip Exercises: Standing   Lateral Step Up  Both;1 set;15 reps;Hand Hold: 0;Step Height: 4"    Wall Squat  2 sets;3 seconds;10 reps    Other Standing Knee Exercises  side stepping with red theraband around knees, 2x RT 15'      Knee/Hip Exercises: Supine   Bridges  Both;2 sets;15 reps      Knee/Hip Exercises: Sidelying    Clams  2x15 reps BLE    Other Sidelying Knee/Hip Exercises  reverse clams bil 1x15 reps (red TB above ankles)      Manual Therapy   Manual Therapy  Joint mobilization    Manual therapy comments  completed separate rest of treatment    Joint Mobilization  3x 30-45 seconds grade I/II, AP glid to Rt/Lt tibiofemoral joint for pain relief        PT Education - 11/18/17 1058    Education Details  Edcuated on new exercises this session and on importance of not overdoing her exercises at home.    Person(s) Educated  Patient    Methods  Explanation    Comprehension  Verbalized understanding;Returned demonstration       PT Short Term Goals - 10/20/17 0911      PT SHORT TERM GOAL #1   Title  Patient will be independent with HEP, updated PRN, to improve strength and functional mobility as well as reduce pain.    Time  2    Period  Weeks    Status  On-going      PT SHORT TERM GOAL #2   Title  Patient will improve MMT for limited group by 1/2 grade to demonstrate improved LE strength to reduce    Time  3    Period  Weeks    Status  On-going        PT Long Term Goals - 10/20/17 8921      PT LONG TERM GOAL #1   Title  Patient will perform 10 squats with normal depth, no early heel rise, and no excessive weight shift to right/equal symmetrical weight bearing to demonstrate improved mechanics and flexibility/ROM.     Time  6    Period  Weeks    Status  On-going      PT LONG TERM GOAL #2   Title  Patient will improve MMT to 5/5 for bil LE throughout all muscle groups to demonstrate improved functional LE strength.     Time  6    Period  Weeks    Status  On-going      PT LONG TERM GOAL #3   Title  Patient will achieve resting knee flexion of 90 degrees or greater to demonstrate improve hip flexor and quadriceps length to improve flexibility for better mechanics with functional mobility.    Time  6    Period  Weeks    Status  On-going       Plan - 11/18/17 1100    Clinical  Impression Statement  Patient reports knee pain is improving bilaterally but that her Lt hip has been sore more recently. She mentioned doing exercises at home that are not a part of her HEP and was educated on importance of performing HEP and not over working her muscles with advanced exercises at home. She progressed LE strengthening today with side stepping with band and wall squats. She denied pain throughout session. Joint mobilization was performed to bil tibiofemoral joints for pain management and ionto was held to determine patient's response. She will continue to benefit from skilled PT interventions to address impairments and progress flexibility and strength.    Rehab Potential  Fair    PT Frequency  2x / week    PT Duration  6 weeks    PT Treatment/Interventions  ADLs/Self Care Home Management;Aquatic Therapy;Cryotherapy;Electrical Stimulation;Iontophoresis 4mg /ml Dexamethasone;Moist Heat;DME Instruction;Gait training;Stair training;Functional mobility training;Therapeutic activities;Therapeutic exercise;Balance training;Neuromuscular re-education;Patient/family education;Manual techniques;Passive range of motion;Dry needling    PT Next Visit Plan  Continue hip strengthening and perform soft tissue mobilization to lateral right thigh PRN and joint mobility to Bil knees for pain reduction. Progress exercise to standing for balance and functional strengthening. Perform soft tissue mobilization if patient is still sore along hips.    PT Home Exercise Plan  Eval: thomas stretch for hip flexor; 8/20: prone quad stretch with rope    Consulted and Agree with Plan of Care  Patient       Patient will benefit from skilled therapeutic intervention in order to improve the following deficits and impairments:  Abnormal gait, Decreased activity tolerance, Decreased endurance, Decreased range of motion, Decreased strength, Decreased balance, Decreased mobility, Difficulty walking, Increased muscle spasms,  Impaired flexibility, Pain, Improper body mechanics, Postural dysfunction  Visit Diagnosis: Chronic pain of right knee  Pain in right hip  Muscle weakness (generalized)  Other abnormalities of gait and mobility     Problem List Patient Active Problem List   Diagnosis Date Noted  . Dyspepsia   . Epigastric pain 09/18/2015  . FH: colon cancer 09/18/2015  . Osteoporosis 05/20/2015  . Moderate single current episode of major depressive disorder ([place]) 02/18/2015  . S/P right UKR 07/02/2014  . Status post unilateral knee replacement 07/02/2014  . Asthma, mild intermittent 02/06/2014  . Lung nodule 02/06/2014  . Hyperlipidemia 01/18/2014  . Expected blood loss anemia 05/10/2013  . Overweight (BMI 25.0-29.9) 05/10/2013  . S/P right THA, AA 05/09/2013  . Pre-operative respiratory examination 05/07/2013  . Post-nasal drainage 05/07/2013  . Endometrial polyp 02/28/2013  . Osteoarthritis of right hip 02/15/2013  . Precordial pain 12/07/2012  . Chronic back pain 08/17/2012  . Hypothyroid 08/17/2012  . Osteopenia 08/17/2012  . Hypersomnia 11/18/2011  . Asthma, mild persistent 08/05/2011  . Memory change 08/05/2011  . TIA (transient ischemic attack) 08/05/2011  . Pulmonary nodule 08/05/2011  . SOB (shortness of breath) 01/05/2011    Kipp Brood, PT, DPT Physical Therapist with [facility]  11/18/2017 1:06 PM    [place] [address] [place], [place], [address] Phone: [phone]   Fax:  [phone]  Name: Elisea Khader MRN: [id number] Date of Birth: October 25, 1945

## 2017-11-23 ENCOUNTER — Other Ambulatory Visit: Payer: Self-pay

## 2017-11-23 ENCOUNTER — Encounter (HOSPITAL_COMMUNITY): Payer: Self-pay

## 2017-11-23 ENCOUNTER — Ambulatory Visit (HOSPITAL_COMMUNITY): Payer: Medicare Other

## 2017-11-23 DIAGNOSIS — G8929 Other chronic pain: Secondary | ICD-10-CM

## 2017-11-23 DIAGNOSIS — M25561 Pain in right knee: Principal | ICD-10-CM

## 2017-11-23 DIAGNOSIS — M6281 Muscle weakness (generalized): Secondary | ICD-10-CM

## 2017-11-23 DIAGNOSIS — M25551 Pain in right hip: Secondary | ICD-10-CM | POA: Diagnosis not present

## 2017-11-23 DIAGNOSIS — R2689 Other abnormalities of gait and mobility: Secondary | ICD-10-CM | POA: Diagnosis not present

## 2017-11-23 NOTE — Progress Notes (Signed)
Pt is aware.  

## 2017-11-23 NOTE — Therapy (Signed)
[place], [place], [address] Phone: [phone]   Fax:  [phone]  Physical Therapy Treatment  Patient Details  Name: Jenna Contreras MRN: [id number] Date of Birth: 01/01/1946 Referring Provider: Paralee Cancel, MD   Encounter Date: 11/23/2017  PT End of Session - 11/23/17 1046    Visit Number  9    Number of Visits  13    Date for PT Re-Evaluation  11/30/17    Authorization Type  Medicare Part A/B primary; [employer] Secondary (PT's ONLY, no auth required, no visit limit)    Authorization Time Period  10/19/17 - 12/03/17    Authorization - Visit Number  9    Authorization - Number of Visits  10    PT Start Time  1517    PT Stop Time  1117    PT Time Calculation (min)  38 min    Activity Tolerance  Patient tolerated treatment well    Behavior During Therapy  [facility] for tasks assessed/performed       Past Medical History:  Diagnosis Date  . Anxiety   . Arthritis   . Arthritis   . Asthma   . Bipolar 2 disorder ([place])   . Cancer (HCC)    Basal Cell  . Constipation   . Depression   . Fibromyalgia   . GERD (gastroesophageal reflux disease)   . Heart murmur    As small child   . History of gout   . History of skin cancer   . HNP (herniated nucleus pulposus with myelopathy), thoracic   . Hypothyroidism   . Left eye injury    In ED 06/17/2014   . Low BP   . Migraines   . OCD (obsessive compulsive disorder)   . Osteoporosis   . Psoriasis   . Scoliosis     Past Surgical History:  Procedure Laterality Date  . Arthroscopic left knee surgery    . BIOPSY  10/28/2017   Procedure: BIOPSY;  Surgeon: Danie Binder, MD;  [facility];  Service: Endoscopy;;  duodenal biopsy , gastric biopsy   . DILATION AND CURETTAGE OF UTERUS  x3  . ESOPHAGOGASTRODUODENOSCOPY (EGD) WITH PROPOFOL N/A 10/28/2017   Procedure: ESOPHAGOGASTRODUODENOSCOPY (EGD) WITH PROPOFOL;  Surgeon: Danie Binder, MD;  [facility];  Service: Endoscopy;  Laterality: N/A;  8:15am  . HIP SURGERY Right 2015  . iliac wings    . KNEE SURGERY Bilateral 2015, 2016  . MANDIBLE SURGERY     X 2  . PARTIAL KNEE ARTHROPLASTY Left 02/12/2014   Procedure: LEFT KNEE UNICOMPARTMENTAL MEDIALLY ARTHROPLASTY ;  Surgeon: Mauri Pole, MD;  [facility];  Service: Orthopedics;  Laterality: Left;  . PARTIAL KNEE ARTHROPLASTY Right 07/02/2014   Procedure: RIGHT UNI KNEE ARTHOPLASTY MEDIALLY;  Surgeon: Paralee Cancel, MD;  [facility];  Service: Orthopedics;  Laterality: Right;  . rod in spine     scoliosis   . TONSILLECTOMY    . TOTAL HIP ARTHROPLASTY Right 05/09/2013   Procedure: RIGHT TOTAL HIP ARTHROPLASTY ANTERIOR APPROACH;  Surgeon: Mauri Pole, MD;  [facility];  Service: Orthopedics;  Laterality: Right;  . TUBAL LIGATION    . WRIST SURGERY      There were no vitals filed for this visit.  Subjective Assessment - 11/23/17 1042    Subjective  Patient reports she was having a migraine and took medicine for that prior to therpay session. She denies  pain at start of session.    Pertinent History  T1-S1 scoliosis spine surgery with rods, Rt THA, Rt and Lt lateral unicondylar knee replacement    Limitations  Lifting;Standing;Walking;House hold activities    Diagnostic tests  Plan to have MRI this Friday 10/22/17 for Rt hip = MD concerned there could be gluteus tear    Currently in Pain?  No/denies       [facility] Adult PT Treatment/Exercise - 11/23/17 0001      Exercises   Exercises  Knee/Hip      Knee/Hip Exercises: Standing   Heel Raises  Both;1 set;20 reps;Limitations;3 seconds   on incline   Heel Raises Limitations  toe raises: 1x 20 reps, 3 sec holds, on decline    Lateral Step Up  Both;1 set;15 reps;Step Height: 4"    Forward Step Up  Both;1 set;15 reps;Hand Hold: 0;Step Height: 4"    Step Down  Both;1 set;15 reps;Hand Hold: 2;Step Height: 4"    Wall Squat  --    Other Standing Knee Exercises  --       Knee/Hip Exercises: Supine   Bridges  Both;2 sets;15 reps      Manual Therapy   Manual Therapy  Joint mobilization    Manual therapy comments  completed separate rest of treatment    Joint Mobilization  3x 30-45 seconds grade I/II, AP glid to Rt/Lt tibiofemoral joint for pain relief       Balance Exercises - 11/23/17 1058      Balance Exercises: Standing   Tandem Stance  Eyes open;Foam/compliant surface;3 reps;20 secs   6 reps total, alt foot alignment   SLS with Vectors  5 reps;10 secs;Foam/compliant surface;Intermittent upper extremity assist        PT Education - 11/23/17 1053    Education Details  Educated on progress overall and on plan to re-assess next visit. Educated on new exercises and updated HEP.    Person(s) Educated  Patient    Methods  Explanation;Demonstration;Verbal cues;Handout    Comprehension  Verbalized understanding;Returned demonstration       PT Short Term Goals - 10/20/17 0911      PT SHORT TERM GOAL #1   Title  Patient will be independent with HEP, updated PRN, to improve strength and functional mobility as well as reduce pain.    Time  2    Period  Weeks    Status  On-going      PT SHORT TERM GOAL #2   Title  Patient will improve MMT for limited group by 1/2 grade to demonstrate improved LE strength to reduce    Time  3    Period  Weeks    Status  On-going        PT Long Term Goals - 10/20/17 0347      PT LONG TERM GOAL #1   Title  Patient will perform 10 squats with normal depth, no early heel rise, and no excessive weight shift to right/equal symmetrical weight bearing to demonstrate improved mechanics and flexibility/ROM.     Time  6    Period  Weeks    Status  On-going      PT LONG TERM GOAL #2   Title  Patient will improve MMT to 5/5 for bil LE throughout all muscle groups to demonstrate improved functional LE strength.     Time  6    Period  Weeks    Status  On-going      PT LONG TERM  GOAL #3   Title  Patient will  achieve resting knee flexion of 90 degrees or greater to demonstrate improve hip flexor and quadriceps length to improve flexibility for better mechanics with functional mobility.    Time  6    Period  Weeks    Status  On-going        Plan - 11/23/17 1047    Clinical Impression Statement  Patient denied pain upon arrival and reports no pain throughout session. Continued with established plan and progressed standing LE strengthening and balance training. Patient had difficulty with tandem stance on foam requiring cues to reduce UE support and strategies to maintain equal WB for balance; she improved with repetition. SLS with vectors also challenged patient and cues for smaller slower movements improve her stability. Functional step ups/downs were progressed and she reported some discomfort with this on Rt knee. Manual interventions performed again for pain relief at EOS. She will continue to benefit from skilled PT interventions to address impairments and progress flexibility and strength. Re-assess next session to determine readiness for discharge and independence with exercises.     Rehab Potential  Fair    PT Frequency  2x / week    PT Duration  6 weeks    PT Treatment/Interventions  ADLs/Self Care Home Management;Aquatic Therapy;Cryotherapy;Electrical Stimulation;Iontophoresis 4mg /ml Dexamethasone;Moist Heat;DME Instruction;Gait training;Stair training;Functional mobility training;Therapeutic activities;Therapeutic exercise;Balance training;Neuromuscular re-education;Patient/family education;Manual techniques;Passive range of motion;Dry needling    PT Next Visit Plan  Re-assess this session to determine readiness to discharge. Last appointment scheduled. Update HEP with standing exercises at kitchen counter.    PT Home Exercise Plan  Eval: thomas stretch for hip flexor; 8/20: prone quad stretch with rope; 11/23/17 - SLS with vectors at counter    Consulted and Agree with Plan of Care  Patient        Patient will benefit from skilled therapeutic intervention in order to improve the following deficits and impairments:  Abnormal gait, Decreased activity tolerance, Decreased endurance, Decreased range of motion, Decreased strength, Decreased balance, Decreased mobility, Difficulty walking, Increased muscle spasms, Impaired flexibility, Pain, Improper body mechanics, Postural dysfunction  Visit Diagnosis: Chronic pain of right knee  Pain in right hip  Muscle weakness (generalized)  Other abnormalities of gait and mobility     Problem List Patient Active Problem List   Diagnosis Date Noted  . Dyspepsia   . Epigastric pain 09/18/2015  . FH: colon cancer 09/18/2015  . Osteoporosis 05/20/2015  . Moderate single current episode of major depressive disorder ([place]) 02/18/2015  . S/P right UKR 07/02/2014  . Status post unilateral knee replacement 07/02/2014  . Asthma, mild intermittent 02/06/2014  . Lung nodule 02/06/2014  . Hyperlipidemia 01/18/2014  . Expected blood loss anemia 05/10/2013  . Overweight (BMI 25.0-29.9) 05/10/2013  . S/P right THA, AA 05/09/2013  . Pre-operative respiratory examination 05/07/2013  . Post-nasal drainage 05/07/2013  . Endometrial polyp 02/28/2013  . Osteoarthritis of right hip 02/15/2013  . Precordial pain 12/07/2012  . Chronic back pain 08/17/2012  . Hypothyroid 08/17/2012  . Osteopenia 08/17/2012  . Hypersomnia 11/18/2011  . Asthma, mild persistent 08/05/2011  . Memory change 08/05/2011  . TIA (transient ischemic attack) 08/05/2011  . Pulmonary nodule 08/05/2011  . SOB (shortness of breath) 01/05/2011    Kipp Brood, PT, DPT Physical Therapist with [facility]  11/23/2017 12:19 PM    [place], [place], [address] Phone: [phone]   Fax:  [phone]  Name: Yesly Gerety MRN: [id number] Date of Birth: [birth year]

## 2017-11-25 ENCOUNTER — Other Ambulatory Visit: Payer: Self-pay

## 2017-11-25 ENCOUNTER — Encounter (HOSPITAL_COMMUNITY): Payer: Self-pay

## 2017-11-25 ENCOUNTER — Ambulatory Visit (HOSPITAL_COMMUNITY): Payer: Medicare Other

## 2017-11-25 ENCOUNTER — Telehealth: Payer: Self-pay | Admitting: Family Medicine

## 2017-11-25 DIAGNOSIS — M6281 Muscle weakness (generalized): Secondary | ICD-10-CM | POA: Diagnosis not present

## 2017-11-25 DIAGNOSIS — R2689 Other abnormalities of gait and mobility: Secondary | ICD-10-CM

## 2017-11-25 DIAGNOSIS — M25551 Pain in right hip: Secondary | ICD-10-CM | POA: Diagnosis not present

## 2017-11-25 DIAGNOSIS — M25561 Pain in right knee: Secondary | ICD-10-CM | POA: Diagnosis not present

## 2017-11-25 DIAGNOSIS — G8929 Other chronic pain: Secondary | ICD-10-CM

## 2017-11-25 NOTE — Telephone Encounter (Signed)
Pt was taken off of muscle relaxers. She is calling in requesting a new script for them her upper back is really hurting and she is having severe muscle spasms. CB# [phone]

## 2017-11-25 NOTE — Therapy (Signed)
[place], [place], [address] Phone: [phone]   Fax:  [phone]  Physical Therapy Treatment/Discharge Summary  Patient Details  Name: Jenna Contreras MRN: [id number] Date of Birth: Nov 09, 1945 Referring Provider: Paralee Cancel, MD   Encounter Date: 11/25/2017  PHYSICAL THERAPY DISCHARGE SUMMARY  Visits from Start of Care: 10  Current functional level related to goals / functional outcomes: Re-assessment performed this session and patient has met all shot term and long term goals. She has made significant improvement in bil knee ROM and bil LE strength based on MMT. She is walking at community gait velocity and at a speed indicating she is not at risk for falling. She also demonstrated SLS of 30 seconds bil. Squat form has improved and patient now demonstrates good depth and hip/knee flexion with no valgus. She has been educated on progress and on transition to regular exercise program at local fitness facility which she is already a member of. She will be discharged following this session.   Remaining deficits: See below   Education / Equipment: Educated on readiness for discharge wtih all goals met and discussed enw HEP exercise to include at home. Educated on transitioning to [facility] exercise routine as patient is already a member.   Plan: Patient agrees to discharge.  Patient goals were met. Patient is being discharged due to meeting the stated rehab goals.  ?????      PT End of Session - 11/25/17 1051    Visit Number  10    Number of Visits  13    Date for PT Re-Evaluation  11/30/17    Authorization Type  Medicare Part A/B primary; [employer] Secondary (PT's ONLY, no auth required, no visit limit)    Authorization Time Period  10/19/17 - 12/03/17    Authorization - Visit Number  10    Authorization - Number of Visits  10    PT Start Time  1040    PT Stop Time  1107    PT Time Calculation (min)  27 min     Activity Tolerance  Patient tolerated treatment well    Behavior During Therapy  WFL for tasks assessed/performed       Past Medical History:  Diagnosis Date  . Anxiety   . Arthritis   . Arthritis   . Asthma   . Bipolar 2 disorder ([place])   . Cancer (HCC)    Basal Cell  . Constipation   . Depression   . Fibromyalgia   . GERD (gastroesophageal reflux disease)   . Heart murmur    As small child   . History of gout   . History of skin cancer   . HNP (herniated nucleus pulposus with myelopathy), thoracic   . Hypothyroidism   . Left eye injury    In ED 06/17/2014   . Low BP   . Migraines   . OCD (obsessive compulsive disorder)   . Osteoporosis   . Psoriasis   . Scoliosis     Past Surgical History:  Procedure Laterality Date  . Arthroscopic left knee surgery    . BIOPSY  10/28/2017   Procedure: BIOPSY;  Surgeon: Danie Binder, MD;  [facility];  Service: Endoscopy;;  duodenal biopsy , gastric biopsy   . DILATION AND CURETTAGE OF UTERUS  x3  . ESOPHAGOGASTRODUODENOSCOPY (EGD) WITH PROPOFOL N/A 10/28/2017   Procedure: ESOPHAGOGASTRODUODENOSCOPY (EGD) WITH PROPOFOL;  Surgeon: Danie Binder, MD;  [facility];  Service: Endoscopy;  Laterality: N/A;  8:15am  . HIP SURGERY Right 2015  . iliac wings    . KNEE SURGERY Bilateral 2015, 2016  . MANDIBLE SURGERY     X 2  . PARTIAL KNEE ARTHROPLASTY Left 02/12/2014   Procedure: LEFT KNEE UNICOMPARTMENTAL MEDIALLY ARTHROPLASTY ;  Surgeon: Mauri Pole, MD;  Location: [facility];  Service: Orthopedics;  Laterality: Left;  . PARTIAL KNEE ARTHROPLASTY Right 07/02/2014   Procedure: RIGHT UNI KNEE ARTHOPLASTY MEDIALLY;  Surgeon: Paralee Cancel, MD;  Location: [facility];  Service: Orthopedics;  Laterality: Right;  . rod in spine     scoliosis   . TONSILLECTOMY    . TOTAL HIP ARTHROPLASTY Right 05/09/2013   Procedure: RIGHT TOTAL HIP ARTHROPLASTY ANTERIOR APPROACH;  Surgeon: Mauri Pole, MD;  Location: [facility];  Service:  Orthopedics;  Laterality: Right;  . TUBAL LIGATION    . WRIST SURGERY      There were no vitals filed for this visit.  Subjective Assessment - 11/25/17 1117    Subjective  Patient reports she had a spasm in her upper/mid back yesterday while walking and that she thinks it may be from not takign muscle relaxers anymore. She reports her migraine has resolved since Tuesday.    Pertinent History  T1-S1 scoliosis spine surgery with rods, Rt THA, Rt and Lt lateral unicondylar knee replacement    Limitations  Lifting;Standing;Walking;House hold activities    Diagnostic tests  Plan to have MRI this Friday 10/22/17 for Rt hip = MD concerned there could be gluteus tear    Currently in Pain?  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, applications  used to walk dogs but can't anymore due to pain      Cognition   Overall Cognitive Status  Within Functional Limits for tasks assessed      Observation/Other Assessments   Focus on Therapeutic Outcomes (FOTO)   54% limited   was 67% limited     Functional Tests   Functional tests  Single leg stance;Squat      Squat   Comments  10 reps: pain to and 8/10 with repetitions, Rt knee>Lt knee; much improved form with good knee/hip flexion, no early heel rise, no valgus, and good depth   pt able to pick pen up from floor with no difficulty     Single Leg Stance   Comments  Bil LE = 30 seconds with eyes open on solid surface      AROM   Right/Left Knee  Right;Left    Right Knee Extension  0    Right Knee Flexion  130   was 118   Left Knee Extension  0    Left Knee  Flexion  136   was 126     Strength   Right Hip Flexion  4+/5   was 4   Right Hip Extension  4/5   was  4-   Right Hip ABduction  4+/5   was 4   Left Hip Flexion  4+/5   was 4   Left Hip Extension  4/5   was 4-   Left Hip ABduction  4+/5   was 4+   Right Knee Flexion  4+/5   was 4-   Right Knee Extension  5/5   was 4   Left Knee Flexion  4+/5   was 4   Left Knee Extension  5/5   was 4+   Right Ankle Dorsiflexion  5/5   was 4+   Left Ankle Dorsiflexion  5/5   was 4+     Flexibility   Quadriceps  Negative thomas test with patient thigh resting on table and knees resting at 90 degrees       [facility] Adult PT Treatment/Exercise - 11/25/17 0001      Exercises   Exercises  Knee/Hip      Knee/Hip Exercises: Standing   Functional Squat  10 reps;Limitations    Functional Squat Limitations  chair taps with min cues for squat form        PT Education - 11/25/17 1111    Education Details  Educated on readiness for discharge wtih all goals met and discussed enw HEP exercise to include at home. Educated on transitioning to [facility] exercise routine as patietn is already [employer].    Person(s) Educated  Patient    Methods  Explanation;Handout    Comprehension  Verbalized understanding;Returned demonstration       PT Short Term Goals - 11/25/17 1113      PT SHORT TERM GOAL #1   Title  Patient will be independent with HEP, updated PRN, to improve strength and functional mobility as well as reduce pain.    Time  2    Period  Weeks    Status  Achieved      PT SHORT TERM GOAL #2   Title  Patient will improve MMT for limited group by 1/2 grade to demonstrate improved LE strength to reduce    Time  3    Period  Weeks    Status  Achieved        PT Long Term Goals - 11/25/17 1113      PT LONG TERM GOAL #1   Title  Patient will perform 10 squats with normal depth, no early heel rise, and no excessive weight shift to right/equal symmetrical weight bearing to demonstrate improved  mechanics and flexibility/ROM.     Time  6    Period  Weeks    Status  Achieved      PT LONG TERM GOAL #2   Title  Patient will improve MMT to 5/5 for bil LE throughout all muscle groups to demonstrate improved functional LE strength.     Time  6    Period  Weeks    Status  Partially Met      PT LONG TERM GOAL #3   Title  Patient will achieve resting knee flexion of 90 degrees or greater to demonstrate improve hip flexor and quadriceps length to improve flexibility for better mechanics with functional mobility.    Time  6    Period  Weeks    Status  Achieved        Plan - 11/25/17 1112    Clinical Impression Statement  Re-assessment performed this session and patient has met all shot term and long term goals. She has made significant improvement in bil  knee ROM and bil LE strength based on MMT. She is walking at community gait velocity and at a speed indicating she is not at risk for falling. She also demonstrated SLS of 30 seconds bil. Squat form has improved and patient now demonstrates good depth and hip/knee flexion with no valgus. She has been educated on progress and on transition to regular exercise program at local fitness facility which she is already a member of. She will be discharged following this session.    Rehab Potential  Fair    PT Frequency  2x / week    PT Duration  6 weeks    PT Treatment/Interventions  ADLs/Self Care Home Management;Aquatic Therapy;Cryotherapy;Electrical Stimulation;Iontophoresis 58m/ml Dexamethasone;Moist Heat;DME Instruction;Gait training;Stair training;Functional mobility training;Therapeutic activities;Therapeutic exercise;Balance training;Neuromuscular re-education;Patient/family education;Manual techniques;Passive range of motion;Dry needling    PT Next Visit Plan  discharge    PT Home Exercise Plan  Eval: thomas stretch for hip flexor; 8/20: prone quad stretch with rope; 11/23/17 - SLS with vectors at counter; chair taps    Consulted and Agree  with Plan of Care  Patient       Patient will benefit from skilled therapeutic intervention in order to improve the following deficits and impairments:  Abnormal gait, Decreased activity tolerance, Decreased endurance, Decreased range of motion, Decreased strength, Decreased balance, Decreased mobility, Difficulty walking, Increased muscle spasms, Impaired flexibility, Pain, Improper body mechanics, Postural dysfunction  Visit Diagnosis: Chronic pain of right knee  Pain in right hip  Muscle weakness (generalized)  Other abnormalities of gait and mobility     Problem List Patient Active Problem List   Diagnosis Date Noted  . Dyspepsia   . Epigastric pain 09/18/2015  . FH: colon cancer 09/18/2015  . Osteoporosis 05/20/2015  . Moderate single current episode of major depressive disorder ([place] 02/18/2015  . S/P right UKR 07/02/2014  . Status post unilateral knee replacement 07/02/2014  . Asthma, mild intermittent 02/06/2014  . Lung nodule 02/06/2014  . Hyperlipidemia 01/18/2014  . Expected blood loss anemia 05/10/2013  . Overweight (BMI 25.0-29.9) 05/10/2013  . S/P right THA, AA 05/09/2013  . Pre-operative respiratory examination 05/07/2013  . Post-nasal drainage 05/07/2013  . Endometrial polyp 02/28/2013  . Osteoarthritis of right hip 02/15/2013  . Precordial pain 12/07/2012  . Chronic back pain 08/17/2012  . Hypothyroid 08/17/2012  . Osteopenia 08/17/2012  . Hypersomnia 11/18/2011  . Asthma, mild persistent 08/05/2011  . Memory change 08/05/2011  . TIA (transient ischemic attack) 08/05/2011  . Pulmonary nodule 08/05/2011  . SOB (shortness of breath) 01/05/2011    RKipp Brood PT, DPT Physical Therapist [facility] 11/25/2017 11:18 AM    [place] [address]Phone: [phone]  Fax:  [phone] Name: DTatiana CourterMRN: [id number] of  Birth: [birth year]

## 2017-11-25 NOTE — Progress Notes (Signed)
CC'D TO PCP °

## 2017-11-26 MED ORDER — CHLORZOXAZONE 500 MG PO TABS: 500.0000 mg | ORAL_TABLET | Freq: Three times a day (TID) | ORAL | 0 refills | Status: DC | PRN

## 2017-11-26 NOTE — Telephone Encounter (Signed)
Patient notified

## 2017-11-26 NOTE — Telephone Encounter (Signed)
May have Parafon forte, 1 3 times daily as needed muscle spasms, use sparingly, #21, not intended for frequent use it because of potential for drowsiness Warm compresses muscle massage and is stretching as best as possible was the best approach

## 2017-11-26 NOTE — Telephone Encounter (Signed)
Prescription sent electronically to pharmacy.  Left message to return call to notify the patient.

## 2017-12-06 DIAGNOSIS — F319 Bipolar disorder, unspecified: Secondary | ICD-10-CM | POA: Diagnosis not present

## 2017-12-06 DIAGNOSIS — F411 Generalized anxiety disorder: Secondary | ICD-10-CM | POA: Diagnosis not present

## 2017-12-06 DIAGNOSIS — Z8659 Personal history of other mental and behavioral disorders: Secondary | ICD-10-CM | POA: Diagnosis not present

## 2017-12-06 DIAGNOSIS — F4310 Post-traumatic stress disorder, unspecified: Secondary | ICD-10-CM | POA: Diagnosis not present

## 2017-12-15 ENCOUNTER — Encounter: Payer: Self-pay | Admitting: Family Medicine

## 2017-12-15 ENCOUNTER — Ambulatory Visit (INDEPENDENT_AMBULATORY_CARE_PROVIDER_SITE_OTHER): Payer: Medicare Other | Admitting: Family Medicine

## 2017-12-15 ENCOUNTER — Encounter (INDEPENDENT_AMBULATORY_CARE_PROVIDER_SITE_OTHER): Payer: Self-pay

## 2017-12-15 VITALS — BP 134/84 | Ht 62.5 in | Wt 137.8 lb

## 2017-12-15 DIAGNOSIS — Z23 Encounter for immunization: Secondary | ICD-10-CM | POA: Diagnosis not present

## 2017-12-15 DIAGNOSIS — Z79891 Long term (current) use of opiate analgesic: Secondary | ICD-10-CM

## 2017-12-15 MED ORDER — HYDROCODONE-ACETAMINOPHEN 10-325 MG PO TABS: ORAL_TABLET | ORAL | 0 refills | Status: DC

## 2017-12-15 MED ORDER — HYDROCODONE-ACETAMINOPHEN 10-325 MG PO TABS: 1.0000 | ORAL_TABLET | Freq: Three times a day (TID) | ORAL | 0 refills | Status: DC | PRN

## 2017-12-15 MED ORDER — LORAZEPAM 0.5 MG PO TABS: ORAL_TABLET | ORAL | 4 refills | Status: DC

## 2017-12-15 MED ORDER — LORAZEPAM 0.5 MG PO TABS: ORAL_TABLET | ORAL | 0 refills | Status: DC

## 2017-12-15 NOTE — Progress Notes (Signed)
Subjective:    Patient ID: Jenna Contreras, female    DOB: September 27, 1945, 72 y.o.   MRN: [id number]  HPI This patient was seen today for chronic pain  The medication list was reviewed and updated.   -Compliance with medication: yes  - Number patient states they take daily: 2-3  -when was the last dose patient took? noon  The patient was advised the importance of maintaining medication and not using illegal substances with these.  Here for refills and follow up  The patient was educated that we can provide 3 monthly scripts for their medication, it is their responsibility to follow the instructions.  Side effects or complications from medications: none  Patient is aware that pain medications are meant to minimize the severity of the pain to allow their pain levels to improve to allow for better function. They are aware of that pain medications cannot totally remove their pain.  Due for UDT ( at least once per year) : due today    She is under tremendous amount of stress she finds herself feeling anxious nervous she feels her bipolar is getting worse along with headaches does not feel good nausea at times denies chest tightness pressure pain shortness of breath states her pain medicine is helping but she has a lot of back pain she takes it approximately 2/day  She is on numerous psychiatric medicines her specialist is working with her     Review of Systems  Constitutional: Negative for activity change and appetite change.  HENT: Negative for congestion and rhinorrhea.   Respiratory: Negative for cough and shortness of breath.   Cardiovascular: Negative for chest pain and leg swelling.  Gastrointestinal: Negative for abdominal pain, nausea and vomiting.  Skin: Negative for color change.  Neurological: Negative for dizziness and weakness.  Psychiatric/Behavioral: Negative for agitation and confusion.       Objective:   Physical Exam  Constitutional: She appears  well-nourished. No distress.  HENT:  Head: Normocephalic and atraumatic.  Eyes: Right eye exhibits no discharge. Left eye exhibits no discharge.  Neck: No tracheal deviation present.  Cardiovascular: Normal rate, regular rhythm and normal heart sounds.  No murmur heard. Pulmonary/Chest: Effort normal and breath sounds normal. No respiratory distress.  Musculoskeletal: She exhibits no edema.  Lymphadenopathy:    She has no cervical adenopathy.  Neurological: She is alert. Coordination normal.  Skin: Skin is warm and dry.  Psychiatric: She has a normal mood and affect. Her behavior is normal.  Vitals reviewed.         Assessment & Plan:  I feel definitely sympathetic to the patient she is under a lot of stress she is trying to help her husband  She feels that he has difficult time processing and thinking Husband recently saw a neurologist who told him that he could start driving again They are looking at getting further neurologic opinion with their psychiatrist Possible neuropsychiatric evaluation for memory loss for her husband I have encouraged the patient as best she can work through this issue because the stress of it is eating at her She will discuss with her psychiatrist possible help for her bipolar  Chronic headaches body pains and discomforts are related into the increased stress she is under if she can get this under better control this should get better she will definitely follow-up if it is getting worse  The patient was seen in followup for chronic pain. A review over at their current pain status was discussed. Drug registry  was checked. Prescriptions were given. Discussion was held regarding the importance of compliance with medication as well as pain medication contract.  Time for questions regarding pain management plan occurred. Importance of regular followup visits was discussed. Patient was informed that medication may cause drowsiness and should not be  combined  with other medications/alcohol or street drugs. Patient was cautioned that medication could cause drowsiness. If the patient feels medication is causing altered alertness then do not drive or operate dangerous equipment.  Finally we did talk at length about healthy eating taking care of herself getting plenty of rest but this is somewhat difficult given her situation  25 minutes was spent with the patient.  This statement verifies that 25 minutes was indeed spent with the patient.  More than 50% of this visit-total duration of the visit-was spent in counseling and coordination of care. The issues that the patient came in for today as reflected in the diagnosis (s) please refer to documentation for further details.

## 2017-12-17 LAB — SPECIMEN STATUS REPORT

## 2017-12-17 LAB — TOXASSURE SELECT 13 (MW), URINE

## 2017-12-21 ENCOUNTER — Telehealth: Payer: Self-pay | Admitting: Family Medicine

## 2017-12-21 NOTE — Telephone Encounter (Signed)
Pt is requesting Dr. Nicki Reaper call in a refill on paraphen Forte. She is requesting he call in more than he did last time due to ins. They wont cover the medication if it is the same amount as last time. Please send to [place], [place], [place] [address]

## 2017-12-21 NOTE — Telephone Encounter (Signed)
Please advise 

## 2017-12-22 ENCOUNTER — Encounter: Payer: Self-pay | Admitting: Gastroenterology

## 2017-12-22 ENCOUNTER — Other Ambulatory Visit: Payer: Self-pay | Admitting: Family Medicine

## 2017-12-22 MED ORDER — CHLORZOXAZONE 500 MG PO TABS: 500.0000 mg | ORAL_TABLET | Freq: Three times a day (TID) | ORAL | 2 refills | Status: DC | PRN

## 2017-12-22 NOTE — Telephone Encounter (Signed)
Discussed with pt. Pt states she will follow dr scott's instructions.

## 2017-12-22 NOTE — Telephone Encounter (Signed)
This is not a medication that I recommend using frequently because it can cause increased drowsiness and increased risk of falling Make sure the patient is aware of this information I will send in new prescription with a slightly larger quantity but once again I would prefer that she not use it frequently for the above reasons

## 2017-12-30 ENCOUNTER — Ambulatory Visit: Payer: Medicare Other | Admitting: Family Medicine

## 2017-12-30 DIAGNOSIS — Z8659 Personal history of other mental and behavioral disorders: Secondary | ICD-10-CM | POA: Diagnosis not present

## 2017-12-30 DIAGNOSIS — F509 Eating disorder, unspecified: Secondary | ICD-10-CM | POA: Diagnosis not present

## 2017-12-30 DIAGNOSIS — F4310 Post-traumatic stress disorder, unspecified: Secondary | ICD-10-CM | POA: Diagnosis not present

## 2017-12-30 DIAGNOSIS — F319 Bipolar disorder, unspecified: Secondary | ICD-10-CM | POA: Diagnosis not present

## 2017-12-31 ENCOUNTER — Ambulatory Visit: Payer: Medicare Other | Admitting: Family Medicine

## 2018-01-13 ENCOUNTER — Telehealth: Payer: Self-pay | Admitting: Family Medicine

## 2018-01-13 MED ORDER — LEVOTHYROXINE SODIUM 88 MCG PO TABS: 88.0000 ug | ORAL_TABLET | Freq: Every day | ORAL | 0 refills | Status: DC

## 2018-01-13 MED ORDER — LEVOTHYROXINE SODIUM 88 MCG PO TABS: 88.0000 ug | ORAL_TABLET | Freq: Every day | ORAL | 1 refills | Status: DC

## 2018-01-13 NOTE — Addendum Note (Signed)
Addended by: Karle Barr on: 01/13/2018 01:49 PM   Modules accepted: Orders

## 2018-01-13 NOTE — Telephone Encounter (Signed)
Patient needs refill on her sythroid .88 mg 90 tablets [facility] and also a prescription to [facility] until she gets her medication in the mail.

## 2018-01-13 NOTE — Telephone Encounter (Signed)
I called vm is full. I have sent in a 10 days supply to [facility] and a [facility] to [facility].

## 2018-01-17 NOTE — Telephone Encounter (Signed)
I called and left a message asked that she r/c. 

## 2018-01-18 DIAGNOSIS — Z8659 Personal history of other mental and behavioral disorders: Secondary | ICD-10-CM | POA: Diagnosis not present

## 2018-01-18 DIAGNOSIS — F319 Bipolar disorder, unspecified: Secondary | ICD-10-CM | POA: Diagnosis not present

## 2018-01-18 DIAGNOSIS — F411 Generalized anxiety disorder: Secondary | ICD-10-CM | POA: Diagnosis not present

## 2018-01-18 DIAGNOSIS — F4310 Post-traumatic stress disorder, unspecified: Secondary | ICD-10-CM | POA: Diagnosis not present

## 2018-01-20 DIAGNOSIS — J45909 Unspecified asthma, uncomplicated: Secondary | ICD-10-CM | POA: Diagnosis not present

## 2018-01-20 DIAGNOSIS — S199XXA Unspecified injury of neck, initial encounter: Secondary | ICD-10-CM | POA: Diagnosis not present

## 2018-01-20 DIAGNOSIS — R0902 Hypoxemia: Secondary | ICD-10-CM | POA: Diagnosis not present

## 2018-01-20 DIAGNOSIS — S8991XA Unspecified injury of right lower leg, initial encounter: Secondary | ICD-10-CM | POA: Diagnosis not present

## 2018-01-20 DIAGNOSIS — R609 Edema, unspecified: Secondary | ICD-10-CM | POA: Diagnosis not present

## 2018-01-20 DIAGNOSIS — S299XXA Unspecified injury of thorax, initial encounter: Secondary | ICD-10-CM | POA: Diagnosis not present

## 2018-01-20 DIAGNOSIS — S51811A Laceration without foreign body of right forearm, initial encounter: Secondary | ICD-10-CM | POA: Diagnosis not present

## 2018-01-20 DIAGNOSIS — M810 Age-related osteoporosis without current pathological fracture: Secondary | ICD-10-CM | POA: Diagnosis not present

## 2018-01-20 DIAGNOSIS — E039 Hypothyroidism, unspecified: Secondary | ICD-10-CM | POA: Diagnosis not present

## 2018-01-20 DIAGNOSIS — Z881 Allergy status to other antibiotic agents status: Secondary | ICD-10-CM | POA: Diagnosis not present

## 2018-01-20 DIAGNOSIS — S0181XA Laceration without foreign body of other part of head, initial encounter: Secondary | ICD-10-CM | POA: Diagnosis not present

## 2018-01-20 DIAGNOSIS — Z884 Allergy status to anesthetic agent status: Secondary | ICD-10-CM | POA: Diagnosis not present

## 2018-01-20 DIAGNOSIS — M25562 Pain in left knee: Secondary | ICD-10-CM | POA: Diagnosis not present

## 2018-01-20 DIAGNOSIS — Z880 Allergy status to penicillin: Secondary | ICD-10-CM | POA: Diagnosis not present

## 2018-01-20 DIAGNOSIS — M25561 Pain in right knee: Secondary | ICD-10-CM | POA: Diagnosis not present

## 2018-01-20 DIAGNOSIS — Z888 Allergy status to other drugs, medicaments and biological substances status: Secondary | ICD-10-CM | POA: Diagnosis not present

## 2018-01-20 DIAGNOSIS — Z79899 Other long term (current) drug therapy: Secondary | ICD-10-CM | POA: Diagnosis not present

## 2018-01-20 DIAGNOSIS — S52121A Displaced fracture of head of right radius, initial encounter for closed fracture: Secondary | ICD-10-CM | POA: Diagnosis not present

## 2018-01-20 DIAGNOSIS — W0110XA Fall on same level from slipping, tripping and stumbling with subsequent striking against unspecified object, initial encounter: Secondary | ICD-10-CM | POA: Diagnosis not present

## 2018-01-20 DIAGNOSIS — R52 Pain, unspecified: Secondary | ICD-10-CM | POA: Diagnosis not present

## 2018-01-20 DIAGNOSIS — S01111A Laceration without foreign body of right eyelid and periocular area, initial encounter: Secondary | ICD-10-CM | POA: Diagnosis not present

## 2018-01-20 DIAGNOSIS — K219 Gastro-esophageal reflux disease without esophagitis: Secondary | ICD-10-CM | POA: Diagnosis not present

## 2018-01-20 DIAGNOSIS — S59911A Unspecified injury of right forearm, initial encounter: Secondary | ICD-10-CM | POA: Diagnosis not present

## 2018-01-20 DIAGNOSIS — S0191XA Laceration without foreign body of unspecified part of head, initial encounter: Secondary | ICD-10-CM | POA: Diagnosis not present

## 2018-01-20 NOTE — Telephone Encounter (Signed)
Pt never returned call. I called the pharm and she did pick up requested med last week.

## 2018-01-21 ENCOUNTER — Telehealth: Payer: Self-pay | Admitting: *Deleted

## 2018-01-21 MED ORDER — OXYCODONE-ACETAMINOPHEN 5-325 MG PO TABS: ORAL_TABLET | ORAL | 0 refills | Status: DC

## 2018-01-21 NOTE — Telephone Encounter (Signed)
Strength percocet? seen where in er? What kind of fx? Let pt know often cannot get ortho same day like this. Nurses/b[facility]ale is there not a walkin ortho clinic in [place] for pts such as this?Willing to srite for short duraion of stronger pain med

## 2018-01-21 NOTE — Telephone Encounter (Signed)
Oxycod/acet 5 / 325 24 one q four to six hrs prn pain, you can let pt know that a radil head fx it is usually ok to see four fivre d ays after initial e r visit

## 2018-01-21 NOTE — Telephone Encounter (Signed)
Patient went to [facility] ER. Patient has a radial head fracture. And was given percocet 5/325  Patient advised of the [facility] urgent care-was given phone number, location and hours of operation

## 2018-01-21 NOTE — Telephone Encounter (Signed)
Patient stated she fell outside yesterday and injured herself very bad. Went to ER and has a broken arn and tore her face up and got stitches in face. Patient stated she was only given #4 Percocet in ER last night because she was to see ortho today but he is in surgery today and cant see her till Monday. Patient states she has her regular hydrocodone that she takes daily but it is not helping at all and needs a scipt for percocet for the pain until she can see ortho monday

## 2018-01-21 NOTE — Telephone Encounter (Signed)
Prescription upfront for pick up. Patient notified. 

## 2018-01-21 NOTE — Telephone Encounter (Signed)
Patient would still like a small amount of percocet for pain

## 2018-01-21 NOTE — Telephone Encounter (Signed)
rfPt would like a referral placed today for another ortho doctor who can get her in today due to the one the ER referred her to cant see her until Monday.

## 2018-01-25 ENCOUNTER — Other Ambulatory Visit: Payer: Self-pay

## 2018-01-31 DIAGNOSIS — S52121A Displaced fracture of head of right radius, initial encounter for closed fracture: Secondary | ICD-10-CM | POA: Diagnosis not present

## 2018-01-31 DIAGNOSIS — M25521 Pain in right elbow: Secondary | ICD-10-CM | POA: Diagnosis not present

## 2018-01-31 DIAGNOSIS — S0181XA Laceration without foreign body of other part of head, initial encounter: Secondary | ICD-10-CM | POA: Diagnosis not present

## 2018-02-10 ENCOUNTER — Encounter: Payer: Self-pay | Admitting: Gastroenterology

## 2018-02-17 DIAGNOSIS — S52124D Nondisplaced fracture of head of right radius, subsequent encounter for closed fracture with routine healing: Secondary | ICD-10-CM | POA: Diagnosis not present

## 2018-02-17 DIAGNOSIS — M25521 Pain in right elbow: Secondary | ICD-10-CM | POA: Diagnosis not present

## 2018-02-21 ENCOUNTER — Ambulatory Visit: Payer: Medicare Other | Admitting: Gastroenterology

## 2018-03-03 DIAGNOSIS — M13841 Other specified arthritis, right hand: Secondary | ICD-10-CM | POA: Diagnosis not present

## 2018-03-03 DIAGNOSIS — M25521 Pain in right elbow: Secondary | ICD-10-CM | POA: Diagnosis not present

## 2018-03-03 DIAGNOSIS — S52124D Nondisplaced fracture of head of right radius, subsequent encounter for closed fracture with routine healing: Secondary | ICD-10-CM | POA: Diagnosis not present

## 2018-03-17 ENCOUNTER — Ambulatory Visit: Payer: Medicare Other | Admitting: Family Medicine

## 2018-03-17 ENCOUNTER — Ambulatory Visit: Payer: Medicare Other | Admitting: Gastroenterology

## 2018-03-22 ENCOUNTER — Ambulatory Visit: Payer: Medicare Other | Admitting: Family Medicine

## 2018-03-23 ENCOUNTER — Encounter: Payer: Self-pay | Admitting: Family Medicine

## 2018-03-23 ENCOUNTER — Ambulatory Visit (INDEPENDENT_AMBULATORY_CARE_PROVIDER_SITE_OTHER): Payer: Medicare Other | Admitting: Family Medicine

## 2018-03-23 VITALS — BP 122/72 | Ht 62.5 in | Wt 133.0 lb

## 2018-03-23 DIAGNOSIS — E038 Other specified hypothyroidism: Secondary | ICD-10-CM

## 2018-03-23 DIAGNOSIS — E7849 Other hyperlipidemia: Secondary | ICD-10-CM

## 2018-03-23 DIAGNOSIS — M546 Pain in thoracic spine: Secondary | ICD-10-CM

## 2018-03-23 DIAGNOSIS — G8929 Other chronic pain: Secondary | ICD-10-CM | POA: Diagnosis not present

## 2018-03-23 MED ORDER — HYDROCODONE-ACETAMINOPHEN 10-325 MG PO TABS: ORAL_TABLET | ORAL | 0 refills | Status: DC

## 2018-03-23 MED ORDER — LORAZEPAM 0.5 MG PO TABS: ORAL_TABLET | ORAL | 4 refills | Status: DC

## 2018-03-23 NOTE — Progress Notes (Signed)
   Subjective:    Patient ID: Jenna Contreras, female    DOB: May 13, 1945, 73 y.o.   MRN: [id number]  HPI  This patient was seen today for chronic pain  The medication list was reviewed and updated.   -Compliance with medication: yes  - Number patient states they take daily: 2 a day  -when was the last dose patient took? Yesterday or the day before   The patient was advised the importance of maintaining medication and not using illegal substances with these.  Here for refills and follow up  The patient was educated that we can provide 3 monthly scripts for their medication, it is their responsibility to follow the instructions.  Side effects or complications from medications: none  Patient is aware that pain medications are meant to minimize the severity of the pain to allow their pain levels to improve to allow for better function. They are aware of that pain medications cannot totally remove their pain.  Due for UDT ( at least once per year) : last done 12/2017       Review of Systems  Constitutional: Negative for activity change and appetite change.  HENT: Negative for congestion and rhinorrhea.   Respiratory: Negative for cough and shortness of breath.   Cardiovascular: Negative for chest pain and leg swelling.  Gastrointestinal: Negative for abdominal pain, nausea and vomiting.  Skin: Negative for color change.  Neurological: Negative for dizziness and weakness.  Psychiatric/Behavioral: Negative for agitation and confusion.       Objective:   Physical Exam Vitals signs reviewed.  Constitutional:      General: She is not in acute distress. HENT:     Head: Normocephalic.  Cardiovascular:     Rate and Rhythm: Normal rate and regular rhythm.     Heart sounds: Normal heart sounds. No murmur.  Pulmonary:     Effort: Pulmonary effort is normal.     Breath sounds: Normal breath sounds.  Lymphadenopathy:     Cervical: No cervical adenopathy.  Neurological:       Mental Status: She is alert.  Psychiatric:        Behavior: Behavior normal.           Assessment & Plan:  The patient will do thyroid and lipid profile somewhere in the course of the next 90 days because of hyperlipidemia and hypothyroidism continue her medication  Chronic pain and discomfort she states the pain medicine does help with her pain makes it more tolerable.  She takes pain medicine no more than twice per day for chronic back pain she has had previous surgery  Neuro medication was sent in smaller amount given patient was encouraged to use this sparingly as best as possible she does have behavioral health issues including chronic anxiety  She will follow-up in 3 months  The patient was seen in followup for chronic pain. A review over at their current pain status was discussed. Drug registry was checked. Prescriptions were given. Discussion was held regarding the importance of compliance with medication as well as pain medication contract.  Time for questions regarding pain management plan occurred. Importance of regular followup visits was discussed. Patient was informed that medication may cause drowsiness and should not be combined  with other medications/alcohol or street drugs. Patient was cautioned that medication could cause drowsiness. If the patient feels medication is causing altered alertness then do not drive or operate dangerous equipment.

## 2018-03-24 DIAGNOSIS — M1811 Unilateral primary osteoarthritis of first carpometacarpal joint, right hand: Secondary | ICD-10-CM | POA: Diagnosis not present

## 2018-03-24 DIAGNOSIS — M1812 Unilateral primary osteoarthritis of first carpometacarpal joint, left hand: Secondary | ICD-10-CM | POA: Diagnosis not present

## 2018-03-24 DIAGNOSIS — M25521 Pain in right elbow: Secondary | ICD-10-CM | POA: Diagnosis not present

## 2018-03-24 DIAGNOSIS — S52124D Nondisplaced fracture of head of right radius, subsequent encounter for closed fracture with routine healing: Secondary | ICD-10-CM | POA: Diagnosis not present

## 2018-03-28 DIAGNOSIS — J209 Acute bronchitis, unspecified: Secondary | ICD-10-CM | POA: Diagnosis not present

## 2018-03-28 DIAGNOSIS — R05 Cough: Secondary | ICD-10-CM | POA: Diagnosis not present

## 2018-03-31 ENCOUNTER — Other Ambulatory Visit: Payer: Self-pay | Admitting: Family Medicine

## 2018-04-01 ENCOUNTER — Encounter: Payer: Self-pay | Admitting: Primary Care

## 2018-04-01 ENCOUNTER — Ambulatory Visit (INDEPENDENT_AMBULATORY_CARE_PROVIDER_SITE_OTHER): Payer: Medicare Other | Admitting: Primary Care

## 2018-04-01 ENCOUNTER — Ambulatory Visit: Payer: Medicare Other | Admitting: Primary Care

## 2018-04-01 VITALS — BP 104/70 | HR 80 | Temp 97.9°F | Ht 62.0 in | Wt 129.0 lb

## 2018-04-01 DIAGNOSIS — J4521 Mild intermittent asthma with (acute) exacerbation: Secondary | ICD-10-CM

## 2018-04-01 LAB — NITRIC OXIDE: FeNO level (ppb): 7

## 2018-04-01 MED ORDER — MOMETASONE FUROATE 110 MCG/INH IN AEPB: 2.0000 | INHALATION_SPRAY | Freq: Two times a day (BID) | RESPIRATORY_TRACT | 0 refills | Status: DC

## 2018-04-01 NOTE — Progress Notes (Signed)
@Patient  ID: Jenna Contreras, female    DOB: 14-May-1945, 73 y.o.   MRN: [id number]  Chief Complaint  Patient presents with  . Acute Visit    post nasal drip, cough with clear and white mucus, wheezing,     Referring provider: Kathyrn Drown, MD  HPI: 73 year old female, former smoker. PMH significant for mild persistent asthma. [name], last seen on 03/17/17.   04/01/2018 Patient presents today for an acute visit with complaints of cough x1 week. Until recently her asthma symptoms have been well controlled. Cough is productive with clear mucus. Associated post nasal drip and low grade fever yesterday 99. She went to UC on 03/28/18 for bronchitis symptoms, given duneb treatment and prescribed oral ciprofloxacin and tessalon perles. Reports asthma attack 1/21 and 1/22 for 20-30 mins.  Some anxiety with coughing fits. Using rescue inhaler with minimal relief. She can not tolerate oral steroids d/t SI. Reports that she can use ICS. FENO today normal.   Allergies  Allergen Reactions  . Prednisone Other (See Comments)    Suicidal ideation  . Adhesive [Tape]     Pulls her skin off  . Betadine [Povidone Iodine] Other (See Comments)    Sets her on fire, turns skin purple   . Demerol [Meperidine] Nausea And Vomiting  . Latex Other (See Comments)    Irritates her skin  . Levaquin [Levofloxacin In D5w]     Painful joints   . Silvadene [Silver Sulfadiazine] Hives  . Trazodone And Nefazodone     Bad dreams  . Ampicillin Rash  . Cephalexin Itching and Rash  . Doxycycline Itching and Rash  . Erythromycin Rash  . Keflex [Cephalexin] Rash  . Penicillins Itching and Rash    Has patient had a PCN reaction causing immediate rash, facial/tongue/throat swelling, SOB or lightheadedness with hypotension: no Has patient had a PCN reaction causing severe rash involving mucus membranes or skin necrosis: no Has patient had a PCN reaction that required hospitalization: no Has patient  had a PCN reaction occurring within the last 10 years: no If all of the above answers are "NO", then may proceed with Cephalosporin use.   . Tetracyclines & Related Itching and Rash    Immunization History  Administered Date(s) Administered  . Influenza Split 03/13/2011, 11/18/2011, 02/24/2013  . Influenza,inj,Quad PF,6+ Mos 01/18/2014, 02/18/2015, 11/22/2015, 11/25/2016, 12/15/2017  . Influenza-Unspecified 12/08/2011  . Pneumococcal Conjugate-13 01/18/2014  . Pneumococcal Polysaccharide-23 03/13/2011  . Rabies, IM 01/06/2017, 01/09/2017, 01/13/2017, 01/20/2017  . Tdap 01/06/2017  . Zoster 01/28/2012    Past Medical History:  Diagnosis Date  . Anxiety   . Arthritis   . Arthritis   . Asthma   . Bipolar 2 disorder ([place])   . Cancer (HCC)    Basal Cell  . Constipation   . Depression   . Fibromyalgia   . GERD (gastroesophageal reflux disease)   . Heart murmur    As small child   . History of gout   . History of skin cancer   . HNP (herniated nucleus pulposus with myelopathy), thoracic   . Hypothyroidism   . Left eye injury    In ED 06/17/2014   . Low BP   . Migraines   . OCD (obsessive compulsive disorder)   . Osteoporosis   . Psoriasis   . Scoliosis     Tobacco History: Social History   Tobacco Use  Smoking Status Former Smoker  . Packs/day: 2.00  . Years: 19.00  .  Pack years: 38.00  . Types: Cigarettes  . Last attempt to quit: 03/10/1979  . Years since quitting: 39.0  Smokeless Tobacco Never Used  Tobacco Comment   Quit in 1981   Counseling given: Not Answered Comment: Quit in 1981   Outpatient Medications Prior to Visit  Medication Sig Dispense Refill  . benzonatate (TESSALON) 100 MG capsule Take 2 capsules by mouth 3 (three) times daily.    . chlorzoxazone (PARAFON) 500 MG tablet TAKE 1 TABLET BY MOUTH THREE TIMES DAILY AS NEEDED FOR FOR MUSCLE SPASMS (USE SPARINGLY, CAUTION DROWSINESS) 35 tablet 2  . Cholecalciferol (VITAMIN D) 2000 UNITS CAPS Take  4,000 Units by mouth daily.     . ciprofloxacin (CIPRO) 500 MG tablet Take 1 tablet by mouth 2 (two) times daily.    . diclofenac (VOLTAREN) 75 MG EC tablet Take 1 tablet (75 mg total) by mouth 2 (two) times daily. 180 tablet 0  . HYDROcodone-acetaminophen (NORCO) 10-325 MG tablet Take one tablet twice daily 60 tablet 0  . HYDROcodone-acetaminophen (NORCO) 10-325 MG tablet 1 twice daily as needed 60 tablet 0  . HYDROcodone-acetaminophen (NORCO) 10-325 MG tablet 1 twice daily as needed 60 tablet 0  . lamoTRIgine (LAMICTAL) 100 MG tablet Take 1 tablet (100 mg total) by mouth 2 (two) times daily. 180 tablet 2  . levothyroxine (SYNTHROID, LEVOTHROID) 88 MCG tablet Take 1 tablet (88 mcg total) by mouth daily before breakfast. 90 tablet 1  . loperamide (IMODIUM) 2 MG capsule Take 2 mg by mouth as needed for diarrhea or loose stools.    Marland Kitchen loratadine (CLARITIN) 10 MG tablet Take 10 mg by mouth daily as needed for allergies.    Marland Kitchen LORazepam (ATIVAN) 0.5 MG tablet TAKE 1 TABLET BY MOUTH TWICE A DAY AS NEEDED FOR ANXIETY 30 tablet 4  . ondansetron (ZOFRAN) 4 MG tablet Take 1 tablet (4 mg total) by mouth every 6 (six) hours. (Patient taking differently: Take 4 mg by mouth as needed for nausea or vomiting. ) 30 tablet 1  . pantoprazole (PROTONIX) 40 MG tablet Take 1 tablet (40 mg total) by mouth daily. 30 tablet 2  . Polyethyl Glycol-Propyl Glycol (SYSTANE ULTRA OP) Place 1 drop into both eyes at bedtime.    Marland Kitchen PROAIR HFA 108 (90 Base) MCG/ACT inhaler USE 2 INHALATIONS EVERY 4 HOURS AS NEEDED FOR WHEEZING OR SHORTNESS OF BREATH (Patient taking differently: Inhale 2 puffs into the lungs every 4 (four) hours as needed for wheezing or shortness of breath. ) 25.5 g 3  . Propylene Glycol (SYSTANE BALANCE) 0.6 % SOLN Place 1 drop into both eyes daily as needed (dry eyes).    . rizatriptan (MAXALT) 10 MG tablet TAKE 1 TABLET ONCE AS NEEDED FOR MIGRAINE. MAY REPEAT IN 2 HOURS IF NEEDED, NO GREATER THAN 4 TABLETS PER WEEK  (MUST LAST 90 DAYS) 48 tablet 1  . sucralfate (CARAFATE) 1 GM/10ML suspension Take 10 mLs (1 g total) by mouth 4 (four) times daily -  with meals and at bedtime. 420 mL 0  . trolamine salicylate (ASPERCREME) 10 % cream Apply 1 application topically as needed for muscle pain.     No facility-administered medications prior to visit.     Review of Systems  Review of Systems  Constitutional: Negative.   HENT: Positive for postnasal drip.   Respiratory: Positive for cough and shortness of breath.   Cardiovascular: Negative.     Physical Exam  BP 104/70 (BP Location: Left Arm, Cuff Size: Normal)  Pulse 80   Temp 97.9 F (36.6 C)   Ht 5\' 2"  (1.575 m)   Wt 129 lb (58.5 kg)   SpO2 96%   BMI 23.59 kg/m  Physical Exam Constitutional:      Appearance: She is well-developed.  HENT:     Head: Normocephalic and atraumatic.  Eyes:     Pupils: Pupils are equal, round, and reactive to light.  Neck:     Musculoskeletal: Normal range of motion and neck supple.  Cardiovascular:     Rate and Rhythm: Normal rate and regular rhythm.     Heart sounds: Normal heart sounds. No murmur.  Pulmonary:     Effort: Pulmonary effort is normal. No respiratory distress.     Breath sounds: Normal breath sounds. No wheezing or rhonchi.  Abdominal:     General: Bowel sounds are normal.     Palpations: Abdomen is soft.     Tenderness: There is no abdominal tenderness.  Skin:    General: Skin is warm and dry.     Findings: No erythema or rash.  Neurological:     Mental Status: She is alert and oriented to person, place, and time.  Psychiatric:        Behavior: Behavior normal.        Judgment: Judgment normal.      Lab Results:  CBC    Component Value Date/Time   WBC 6.0 10/19/2017 1153   WBC 9.5 02/03/2017 0300   RBC 4.26 10/19/2017 1153   RBC 4.80 02/03/2017 0300   HGB 12.5 10/19/2017 1153   HCT 37.5 10/19/2017 1153   PLT 316 10/19/2017 1153   MCV 88 10/19/2017 1153   MCV 86 06/25/2012  0516   MCH 29.3 10/19/2017 1153   MCH 29.2 02/03/2017 0300   MCHC 33.3 10/19/2017 1153   MCHC 31.5 02/03/2017 0300   RDW 14.1 10/19/2017 1153   RDW 13.0 06/25/2012 0516   LYMPHSABS 2.2 10/19/2017 1153   MONOABS 0.5 02/03/2017 0300   EOSABS 0.1 10/19/2017 1153   BASOSABS 0.1 10/19/2017 1153   BASOSABS 1 06/25/2012 0516    BMET    Component Value Date/Time   NA 143 10/19/2017 1153   NA 142 06/25/2012 0516   K 4.8 10/19/2017 1153   K 3.7 06/25/2012 0516   CL 109 (H) 10/19/2017 1153   CL 107 06/25/2012 0516   CO2 18 (L) 10/19/2017 1153   CO2 28 06/25/2012 0516   GLUCOSE 104 (H) 10/19/2017 1153   GLUCOSE 115 (H) 02/03/2017 0300   GLUCOSE 78 06/25/2012 0516   BUN 19 10/19/2017 1153   BUN 16 06/25/2012 0516   CREATININE 0.80 10/19/2017 1153   CREATININE 0.76 01/18/2014 1223   CALCIUM 9.5 10/19/2017 1153   CALCIUM 8.8 06/25/2012 0516   GFRNONAA 73 10/19/2017 1153   GFRNONAA >60 06/25/2012 0516   GFRAA 85 10/19/2017 1153   GFRAA >60 06/25/2012 0516    BNP No results found for: BNP  ProBNP    Component Value Date/Time   PROBNP 42.9 04/12/2012 1917    Imaging: No results found.   Assessment & Plan:   Asthma, mild intermittent - Mild exacerbation d/t acute bronchitis - LSC clear on exam, O2 sat 96% RA. FENO 7 - Given RX ciprofloxacin BID x 7 days per UC, advised to continue until complete  - Unable to take oral steroids  - Trial Asmanex 100 two puffs q12hrs x 2 weeks (sample given) - Continue Tessalon perles prn cough  -  Add Delsym cough syrup and flonase nasal spray BID - Return if symptoms do not improve or worsen    Martyn Ehrich, NP 04/01/2018

## 2018-04-01 NOTE — Assessment & Plan Note (Addendum)
-   Mild exacerbation d/t acute bronchitis - LSC clear on exam, O2 sat 96% RA. FENO 7 - Given RX ciprofloxacin BID x 7 days per UC, advised to continue until complete  - Unable to take oral steroids  - Trial Asmanex 100 two puffs q12hrs x 2 weeks (sample given) - Continue Tessalon perles prn cough  - Add Delsym cough syrup and flonase nasal spray BID - Return if symptoms do not improve or worsen

## 2018-04-01 NOTE — Patient Instructions (Signed)
Start Asmanex 2 puffs twice daily x 2 weeks (sample given)  Albuterol rescue inhaler 2 puffs every 4-6 hours as needed for shortness of breath or wheezing   Continue Ciprofloxacin course until completed  Office testing: FENO normal   Recommendations: Add delsym cough syrup twice daily for cough  Add Flonase nasal spray twice daily for post nasal drip  Continue tessalon perle as needed for cough  Follow-up: Return if symptoms do not improve or worsen    Asthma Attack  Acute bronchospasm caused by asthma is also referred to as an asthma attack. Bronchospasm means that the air passages become narrowed or "tight," which limits the amount of oxygen that can get into the lungs. The narrowing is caused by inflammation and tightening of the muscles in the air tubes (bronchi) in the lungs. Excessive mucus is also produced, which narrows the airways more. This can cause trouble breathing, coughing, and loud breathing (wheezing). What are the causes? Possible triggers include:  Animal dander from the skin, hair, or feathers of animals.  Dust mites contained in house dust.  Cockroaches.  Pollen from trees or grass.  Mold.  Cigarette or tobacco smoke.  Air pollutants such as dust, household cleaners, hair sprays, aerosol sprays, paint fumes, strong chemicals, or strong odors.  Cold air or weather changes. Cold air may trigger inflammation. Winds increase molds and pollens in the air.  Strong emotions such as crying or laughing hard.  Stress.  Certain medicines, such as aspirin or beta-blockers.  Sulfites in foods and drinks, such as dried fruits and wine.  Infections or inflammatory conditions, such as a flu, a cold, pneumonia, or inflammation of the nasal membranes (rhinitis).  Gastroesophageal reflux disease (GERD). GERD is a condition in which stomach acid backs up into your esophagus, which can irritate nearby airway structures.  Exercise or activity that requires a lot of  energy. What are the signs or symptoms? Symptoms of this condition include:  Wheezing. This may sound like whistling while breathing. This may be more noticeable at night.  Excessive coughing, particularly at night.  Chest tightness or pain.  Shortness of breath.  Feeling like you cannot get enough air no matter how hard you try (air hunger). How is this diagnosed? This condition may be diagnosed based on:  Your medical history.  Your symptoms.  A physical exam.  Tests to check for other causes of your symptoms or other conditions that may have triggered your asthma attack. These tests may include: ? Chest X-ray. ? Blood tests. ? Specialized tests to assess lung function, such as breathing into a device that measures how much air you inhale and exhale (spirometry). How is this treated? The goal of treatment is to open the airways in your lungs and reduce inflammation. Most asthma attacks are treated with medicines that you inhale through a hand-held inhaler (metered dose inhaler, MDI) or a device that turns liquid medicine into a mist that you inhale (nebulizer). Medicines may include:  Quick relief or rescue medicines that relax the muscles of the bronchi. These medicines include bronchodilators, such as albuterol.  Controller medicines, such as inhaled corticosteroids. These are long-acting medicines that are used for daily asthma maintenance. If you have a moderate or severe asthma attack, you may be treated with steroid medicines by mouth or through an IV injection at the hospital. Steroid medicines reduce inflammation in your lungs. Depending on the severity of your attack, you may need oxygen therapy to help you breathe. If your asthma  attack was caused by a bacterial infection, such as pneumonia, you will be given antibiotic medicines. Follow these instructions at home: Medicines  Take over-the-counter and prescription medicines only as told by your health care provider.  Keep your medicines up-to-date and available.  If you are more than [redacted] weeks pregnant and you are prescribed any new medicines, tell your obstetrician about those medicines.  If you were prescribed an antibiotic medicine, take it as told by your health care provider. Do not stop taking the antibiotic even if you start to feel better. Avoiding triggers   Keep track of things that trigger your asthma attacks or cause you to have breathing problems, and avoid exposure to these triggers.  Do not use any products that contain nicotine or tobacco, such as cigarettes and e-cigarettes. If you need help quitting, ask your health care provider.  Avoid secondhand smoke.  Avoid strong smells, such as perfumes, aerosols, and cleaning solvents.  When pollen or air pollution is bad, keep windows closed and use an air conditioner or go to places with air conditioning. Asthma action plan  Work with your health care provider to make a written plan for managing and treating your asthma attacks (asthma action plan). This plan should include: ? A list of your asthma triggers and how to avoid them. ? Information about when your medicines should be taken and when their dosage should be changed. ? Instructions about using a device called a peak flow meter to monitor your condition. A peak flow meter measures how well your lungs are working and measures how severe your asthma is at a given time. Your "personal best" is the highest peak flow rate you can reach when you feel good and have no asthma symptoms. General instructions  Avoid excessive exercise or activity until your asthma attack resolves. Ask your health care provider what activities are safe for you and when you can return to your normal activities.  Stay up to date on all vaccinations recommended by your health care provider, such as flu and pneumonia vaccines.  Drink enough fluid to keep your urine clear or pale yellow. Staying hydrated helps keep  mucus in your lungs thin so it can be coughed up easily.  If you drink caffeine, do so in moderation.  Do not use alcohol until you have recovered.  Keep all follow-up visits as told by your health care provider. This is important. Asthma requires careful medical care, and you and your health care provider can work together to reduce the likelihood of future attacks. Contact a health care provider if:  Your peak flow reading is still at 50-79% of your personal best after you have followed your action plan for 1 hour. This is in the yellow zone, which means "caution."  You need to use a reliever medicine more than 2-3 times a week.  Your medicines are causing side effects, such as: ? Rash. ? Itching. ? Swelling. ? Trouble breathing.  Your symptoms do not improve after 48 hours.  You cough up mucus (sputum) that is thicker than usual.  You have a fever.  You need to use your medicines much more frequently than normal. Get help right away if:  Your peak flow reading is less than 50% of your personal best. This is in the red zone, which means "danger."  You have severe trouble breathing.  You develop chest pain or discomfort.  Your medicines no longer seem to be helping.  You vomit.  You cannot eat  or drink without vomiting.  You are coughing up yellow, green, brown, or bloody mucus.  You have a fever and your symptoms suddenly get worse.  You have trouble swallowing.  You feel very tired, and breathing becomes tiring. Summary  Acute bronchospasm caused by asthma is also referred to as an asthma attack.  Bronchospasm is caused by narrowing or tightness in air passages, which causes shortness of breath, coughing, and loud breathing (wheezing).  Many things can trigger an asthma attack, such as allergens, weather changes, exercise, smoke, and other fumes.  Treatment for an asthma attack may include inhaled rescue medicines for immediate relief, as well as the use of  maintenance therapy.  Get help right away if you have worsening shortness of breath, chest pain, or fever, or if your home medicines are no longer helping with your symptoms. This information is not intended to replace advice given to you by your health care provider. Make sure you discuss any questions you have with your health care provider. Document Released: 06/10/2006 Document Revised: 03/27/2016 Document Reviewed: 03/27/2016 Elsevier Interactive Patient Education  2019 [employer].
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
## 2018-04-01 NOTE — Addendum Note (Signed)
Addended by: Karmen Stabs on: 04/01/2018 12:26 PM   Modules accepted: Orders

## 2018-04-06 MED ORDER — MOMETASONE FUROATE 100 MCG/ACT IN AERO: 2.0000 | INHALATION_SPRAY | Freq: Two times a day (BID) | RESPIRATORY_TRACT | 0 refills | Status: DC

## 2018-04-06 NOTE — Addendum Note (Signed)
Addended by: Karmen Stabs on: 04/06/2018 02:16 PM   Modules accepted: Orders

## 2018-04-12 DIAGNOSIS — F411 Generalized anxiety disorder: Secondary | ICD-10-CM | POA: Diagnosis not present

## 2018-04-12 DIAGNOSIS — F319 Bipolar disorder, unspecified: Secondary | ICD-10-CM | POA: Diagnosis not present

## 2018-04-12 DIAGNOSIS — Z8659 Personal history of other mental and behavioral disorders: Secondary | ICD-10-CM | POA: Diagnosis not present

## 2018-04-12 DIAGNOSIS — F4310 Post-traumatic stress disorder, unspecified: Secondary | ICD-10-CM | POA: Diagnosis not present

## 2018-04-22 ENCOUNTER — Telehealth: Payer: Self-pay | Admitting: Family Medicine
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
## 2018-05-05 ENCOUNTER — Ambulatory Visit: Payer: Medicare Other | Admitting: Gastroenterology

## 2018-05-05 DIAGNOSIS — F411 Generalized anxiety disorder: Secondary | ICD-10-CM | POA: Diagnosis not present

## 2018-05-05 DIAGNOSIS — F319 Bipolar disorder, unspecified: Secondary | ICD-10-CM | POA: Diagnosis not present

## 2018-05-05 DIAGNOSIS — F4310 Post-traumatic stress disorder, unspecified: Secondary | ICD-10-CM | POA: Diagnosis not present

## 2018-05-05 DIAGNOSIS — Z8659 Personal history of other mental and behavioral disorders: Secondary | ICD-10-CM | POA: Diagnosis not present

## 2018-05-11 ENCOUNTER — Ambulatory Visit: Payer: Medicare Other | Admitting: Adult Health

## 2018-05-11 ENCOUNTER — Telehealth: Payer: Self-pay | Admitting: Family Medicine

## 2018-05-11 ENCOUNTER — Ambulatory Visit: Payer: Medicare Other | Admitting: Primary Care

## 2018-05-11 NOTE — Telephone Encounter (Signed)
Patient can have 5-month supply of her thyroid medicine with a refill  Please refuse a muscle relaxer I do not recommend this for anyone who is on pain medicine and above age 75. Secondly anyone above 65 should not be on a regular muscle relaxers This is due to an increased risk of falls and injuries

## 2018-05-11 NOTE — Telephone Encounter (Signed)
PATIENT REQUESTING REFILLS    chlorzoxazone (PARAFON) 500 MG tablet   3 month supply  Pharmacy:  [facility]  levothyroxine (SYNTHROID, LEVOTHROID) 88 MCG tablet  3 month supply   Pharmacy:  [facility]  levothyroxine (SYNTHROID, LEVOTHROID) 88 MCG tablet  10 day supply  Pharmacy:  [facility], [place], [place]

## 2018-05-12 ENCOUNTER — Encounter: Payer: Self-pay | Admitting: Primary Care

## 2018-05-12 ENCOUNTER — Ambulatory Visit (INDEPENDENT_AMBULATORY_CARE_PROVIDER_SITE_OTHER)
Admission: RE | Admit: 2018-05-12 | Discharge: 2018-05-12 | Disposition: A | Discharge status: Home / Self Care | Payer: Medicare Other | Source: Ambulatory Visit | Attending: Primary Care | Admitting: Primary Care

## 2018-05-12 ENCOUNTER — Ambulatory Visit (INDEPENDENT_AMBULATORY_CARE_PROVIDER_SITE_OTHER): Payer: Medicare Other | Admitting: Primary Care

## 2018-05-12 VITALS — BP 96/58 | HR 78 | Temp 98.6°F | Ht 62.5 in | Wt 133.8 lb

## 2018-05-12 DIAGNOSIS — R05 Cough: Secondary | ICD-10-CM

## 2018-05-12 DIAGNOSIS — R059 Cough, unspecified: Secondary | ICD-10-CM

## 2018-05-12 LAB — RESPIRATORY VIRUS PANEL
INFLUENZA A RNA: NOT DETECTED
INFLUENZA B RNA: NOT DETECTED
RSV RNA: NOT DETECTED
hMPV: NOT DETECTED

## 2018-05-12 MED ORDER — LEVOTHYROXINE SODIUM 88 MCG PO TABS: 88.0000 ug | ORAL_TABLET | Freq: Every day | ORAL | 1 refills | Status: DC

## 2018-05-12 MED ORDER — MOMETASONE FUROATE 100 MCG/ACT IN AERO: 2.0000 | INHALATION_SPRAY | Freq: Two times a day (BID) | RESPIRATORY_TRACT | 5 refills | Status: DC

## 2018-05-12 MED ORDER — MONTELUKAST SODIUM 10 MG PO TABS: 10.0000 mg | ORAL_TABLET | Freq: Every day | ORAL | 11 refills | Status: DC

## 2018-05-12 NOTE — Patient Instructions (Addendum)
Orders: CXR today  Labs (HP panel)   Recommendations: Resume Asmanex 2 puffs twice daily (sample given and RX sent) Continues Flonase nasal spray  Adding SINGULAIR    Return with [name] if not better

## 2018-05-12 NOTE — Telephone Encounter (Signed)
Thyroid medication sent to mail order and local pharmcy as requested- left message to return call to notify patient about muscle relaxer

## 2018-05-12 NOTE — Assessment & Plan Note (Addendum)
Reports dry cough with chest tightness/soreness Social hx- patient has two birds in her house  Resume Asmanex hfa 2 puffs twice daily (RX sent) Adding SINGULAIR  Checking CXR and HP panel
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
## 2018-05-13 NOTE — Telephone Encounter (Signed)
Patient notified and verbalized understanding. 

## 2018-05-16 LAB — HYPERSENSITIVITY PNEUMONITIS
A. FUMIGATUS #1 ABS: NEGATIVE
A. Pullulans Abs: NEGATIVE
Micropolyspora faeni, IgG: NEGATIVE
Pigeon Serum Abs: NEGATIVE
THERMOACT. SACCHARII: NEGATIVE
Thermoactinomyces vulgaris, IgG: NEGATIVE

## 2018-05-25 ENCOUNTER — Telehealth: Payer: Self-pay | Admitting: Family Medicine

## 2018-05-25 NOTE — Telephone Encounter (Signed)
I called and rescheduled Jenna Contreras's appt for tomorrow and Jenna Contreras said that the pharmacy was suppose to be sending a request over for her pain medication.  Hydrocodone to [facility].

## 2018-05-27 ENCOUNTER — Other Ambulatory Visit: Payer: Self-pay

## 2018-05-27 MED ORDER — FLUTICASONE PROPIONATE HFA 110 MCG/ACT IN AERO: 1.0000 | INHALATION_SPRAY | Freq: Two times a day (BID) | RESPIRATORY_TRACT | 3 refills | Status: DC

## 2018-05-27 NOTE — Telephone Encounter (Signed)
Jenna Contreras drug states she picked up hydrocodone prescribed by dr Nicki Reaper on march 18th for #60 tablets.

## 2018-05-27 NOTE — Telephone Encounter (Signed)
Please find out from her pharmacy when her last pain prescription was filled thank you

## 2018-05-29 ENCOUNTER — Telehealth: Payer: Self-pay | Admitting: Pulmonary Disease

## 2018-05-29 NOTE — Telephone Encounter (Signed)
Patient called answering service Concern about COVID  Had a diarrheal illness, has a cough No fever No direct contact with anybody with a febrile illness or COVID patient   Records indicate multiple treatments for cough recently  I did advise her that if she is not feeling acutely ill to shelter in place, avoid contact with others if she is concerned about COVID  Call the office tomorrow for guidance regarding testing

## 2018-05-30 ENCOUNTER — Encounter: Payer: Self-pay | Admitting: Nurse Practitioner

## 2018-05-30 ENCOUNTER — Other Ambulatory Visit: Payer: Self-pay

## 2018-05-30 ENCOUNTER — Ambulatory Visit: Payer: Medicare Other | Admitting: Nurse Practitioner

## 2018-05-30 ENCOUNTER — Telehealth: Payer: Self-pay | Admitting: Internal Medicine

## 2018-05-30 ENCOUNTER — Telehealth: Payer: Medicare Other | Admitting: Nurse Practitioner

## 2018-05-30 ENCOUNTER — Ambulatory Visit (INDEPENDENT_AMBULATORY_CARE_PROVIDER_SITE_OTHER): Payer: Medicare Other | Admitting: Nurse Practitioner

## 2018-05-30 ENCOUNTER — Telehealth: Payer: Self-pay | Admitting: Nurse Practitioner

## 2018-05-30 DIAGNOSIS — B349 Viral infection, unspecified: Secondary | ICD-10-CM | POA: Diagnosis not present

## 2018-05-30 DIAGNOSIS — J069 Acute upper respiratory infection, unspecified: Secondary | ICD-10-CM | POA: Insufficient documentation

## 2018-05-30 MED ORDER — MOMETASONE FUROATE 100 MCG/ACT IN AERO: 2.0000 | INHALATION_SPRAY | Freq: Two times a day (BID) | RESPIRATORY_TRACT | 5 refills | Status: DC

## 2018-05-30 MED ORDER — CLINDAMYCIN HCL 300 MG PO CAPS: 300.0000 mg | ORAL_CAPSULE | Freq: Three times a day (TID) | ORAL | 0 refills | Status: AC

## 2018-05-30 NOTE — Assessment & Plan Note (Addendum)
Patient had a tele-visit today from recent vomiting and ongoing dry cough.  She states that symptoms started on 05/25/2026 with vomiting and diarrhea.  The symptoms subsided after 24 hours, but then on 05/27/2018 patient states that she developed a dry cough and a low-grade fever.  States that her temperature was 99.5 F on that day.  She states that she does have a history of recurrent pneumonia.  Her cough has continued and is progressively worsening.  She is concerned that she may have Covid 19.  She also complains today of recent body aches but states that she does have arthritis and typically does have body aches on a regular basis.  Concerned that patient could be developing aspiration pneumonia. She has multiple drug allergies. Will order clindamycin. Gave patient covid screening telemed number. She will call to be evaluated to be screened for covid 19 testing.  Patient Instructions  Will order clindamycin Will refill Asmanex Continue Singulair  Follow up in 6 weeks or sooner if needed If symptoms worsen please go to the ED.    Coronavirus (COVID-19) Are you at risk?  Are you at risk for the Coronavirus (COVID-19)?  To be considered HIGH RISK for Coronavirus (COVID-19), you have to meet the following criteria:  . Traveled to [place], [place], [place], [place] or [place]; or in the [place] to [place], [place], [place], or [place]; and have fever, cough, and shortness of breath within the last 2 weeks of travel OR . Been in close contact with a person diagnosed with COVID-19 within the last 2 weeks and have fever, cough, and shortness of breath . IF YOU DO NOT MEET THESE CRITERIA, YOU ARE CONSIDERED LOW RISK FOR COVID-19.  What to do if you are HIGH RISK for COVID-19?  Marland Kitchen If you are having a medical emergency, call 911. . Seek medical care right away. Before you go to a doctor's office, urgent care or emergency department, call ahead and tell them about your recent travel,  contact with someone diagnosed with COVID-19, and your symptoms. You should receive instructions from your physician's office regarding next steps of care.  . When you arrive at healthcare provider, tell the healthcare staff immediately you have returned from visiting [place], [place], [place], [place] or [place]; or traveled in the [place] to [place], [place], [place], or [place]; in the last two weeks or you have been in close contact with a person diagnosed with COVID-19 in the last 2 weeks.   . Tell the health care staff about your symptoms: fever, cough and shortness of breath. . After you have been seen by a medical provider, you will be either: o Tested for (COVID-19) and discharged home on quarantine except to seek medical care if symptoms worsen, and asked to  - Stay home and avoid contact with others until you get your results (4-5 days)  - Avoid travel on public transportation if possible (such as bus, train, or airplane) or o Sent to the Emergency Department by EMS for evaluation, COVID-19 testing, and possible admission depending on your condition and test results.  What to do if you are LOW RISK for COVID-19?  Reduce your risk of any infection by using the same precautions used for avoiding the common cold or flu:  Marland Kitchen Wash your hands often with soap and warm water for at least 20 seconds.  If soap and water are not readily available, use an alcohol-based hand sanitizer with at least 60%  alcohol.  . If coughing or sneezing, cover your mouth and nose by coughing or sneezing into the elbow areas of your shirt or coat, into a tissue or into your sleeve (not your hands). . Avoid shaking hands with others and consider head nods or verbal greetings only. . Avoid touching your eyes, nose, or mouth with unwashed hands.  . Avoid close contact with people who are sick. . Avoid places or events with large numbers of people in one location, like concerts or sporting events. . Carefully  consider travel plans you have or are making. . If you are planning any travel outside or inside the [place], visit the CDC's Travelers' Health webpage for the latest health notices. . If you have some symptoms but not all symptoms, continue to monitor at home and seek medical attention if your symptoms worsen. . If you are having a medical emergency, call 911.   [place] / e-Visit: [email or web address]         [facility]: [facility]: [phone]                   [facility]: [phone]

## 2018-05-30 NOTE — Telephone Encounter (Signed)
Called and spoke with patient, she has been scheduled at 300 today with [place].

## 2018-05-30 NOTE — Telephone Encounter (Signed)
schedule e-vist with an app today please

## 2018-05-30 NOTE — Patient Instructions (Addendum)
Will order clindamycin Will refill Asmanex Continue Singulair  Follow up in 6 weeks or sooner if needed If symptoms worsen please go to the ED.    Coronavirus (COVID-19) Are you at risk?  Are you at risk for the Coronavirus (COVID-19)?  To be considered HIGH RISK for Coronavirus (COVID-19), you have to meet the following criteria:  . Traveled to [place], [place], [place], [place] or [place]; or in the [place] to [place], [place], [place], or [place]; and have fever, cough, and shortness of breath within the last 2 weeks of travel OR . Been in close contact with a person diagnosed with COVID-19 within the last 2 weeks and have fever, cough, and shortness of breath . IF YOU DO NOT MEET THESE CRITERIA, YOU ARE CONSIDERED LOW RISK FOR COVID-19.  What to do if you are HIGH RISK for COVID-19?  Marland Kitchen If you are having a medical emergency, call 911. . Seek medical care right away. Before you go to a doctor's office, urgent care or emergency department, call ahead and tell them about your recent travel, contact with someone diagnosed with COVID-19, and your symptoms. You should receive instructions from your physician's office regarding next steps of care.  . When you arrive at healthcare provider, tell the healthcare staff immediately you have returned from visiting [place], [place], [place], [place] or [place]; or traveled in the [place] to [place], [place], [place], or [place]; in the last two weeks or you have been in close contact with a person diagnosed with COVID-19 in the last 2 weeks.   . Tell the health care staff about your symptoms: fever, cough and shortness of breath. . After you have been seen by a medical provider, you will be either: o Tested for (COVID-19) and discharged home on quarantine except to seek medical care if symptoms worsen, and asked to  - Stay home and avoid contact with others until you get your results (4-5 days)  - Avoid travel on public  transportation if possible (such as bus, train, or airplane) or o Sent to the Emergency Department by EMS for evaluation, COVID-19 testing, and possible admission depending on your condition and test results.  What to do if you are LOW RISK for COVID-19?  Reduce your risk of any infection by using the same precautions used for avoiding the common cold or flu:  Marland Kitchen Wash your hands often with soap and warm water for at least 20 seconds.  If soap and water are not readily available, use an alcohol-based hand sanitizer with at least 60% alcohol.  . If coughing or sneezing, cover your mouth and nose by coughing or sneezing into the elbow areas of your shirt or coat, into a tissue or into your sleeve (not your hands). . Avoid shaking hands with others and consider head nods or verbal greetings only. . Avoid touching your eyes, nose, or mouth with unwashed hands.  . Avoid close contact with people who are sick. . Avoid places or events with large numbers of people in one location, like concerts or sporting events. . Carefully consider travel plans you have or are making. . If you are planning any travel outside or inside the [place], visit the CDC's Travelers' Health webpage for the latest health notices. . If you have some symptoms but not all symptoms, continue to monitor at home and seek medical attention if your symptoms worsen. . If you are having a medical emergency, call 911.  [place] / e-Visit: [email or web address]         [facility]: [facility]: [phone]                   [facility]: [phone]

## 2018-05-30 NOTE — Progress Notes (Signed)
Virtual Visit via Telephone Note  I connected with Jenna Contreras on 05/30/18 at  3:00 PM EDT by telephone and verified that I am speaking with the correct person using two identifiers.   I discussed the limitations, risks, security and privacy concerns of performing an evaluation and management service by telephone and the availability of in person appointments. I also discussed with the patient that there may be a patient responsible charge related to this service. The patient expressed understanding and agreed to proceed.   History of Present Illness: 73 year old female former smoker with mild asthma who is followed by [name].  Patient had a tele-visit today from recent vomiting and ongoing dry cough.  She states that symptoms started on 05/25/2026 with vomiting and diarrhea.  The symptoms subsided after 24 hours, but then on 05/27/2018 patient states that she developed a dry cough and a low-grade fever.  States that her temperature was 99.5 F on that day.  She states that she does have a history of recurrent pneumonia.  Her cough has continued and is progressively worsening.  She is concerned that she may have Covid 19.  She also complains today of recent body aches but states that she does have arthritis and typically does have body aches on a regular basis.  She denies any chest pain or edema.  She denies any significant shortness of breath.   Observations/Objective:  CXR 05/12/18 - No active cardiopulmonary disease.  Assessment and Plan: Viral illness  Patient had a tele-visit today from recent vomiting and ongoing dry cough.  She states that symptoms started on 05/25/2026 with vomiting and diarrhea.  The symptoms subsided after 24 hours, but then on 05/27/2018 patient states that she developed a dry cough and a low-grade fever.  States that her temperature was 99.5 F on that day.  She states that she does have a history of recurrent pneumonia.  Her cough has continued and is  progressively worsening.  She is concerned that she may have Covid 19.  She also complains today of recent body aches but states that she does have arthritis and typically does have body aches on a regular basis.  Concerned that patient could be developing aspiration pneumonia. She has multiple drug allergies. Will order clindamycin. Gave patient covid screening telemed number. She will call to be evaluated to be screened for covid 19 testing.  Patient Instructions  Will order clindamycin Will refill Asmanex Continue Singulair    Follow Up Instructions:  Follow up in 6 weeks or sooner if needed If symptoms worsen please go to the ED.    I discussed the assessment and treatment plan with the patient. The patient was provided an opportunity to ask questions and all were answered. The patient agreed with the plan and demonstrated an understanding of the instructions.   The patient was advised to call back or seek an in-person evaluation if the symptoms worsen or if the condition fails to improve as anticipated.  I provided 23 minutes of non-face-to-face time during this encounter.   Fenton Foy, NP

## 2018-05-30 NOTE — Telephone Encounter (Signed)
Please note that she should have enough for now

## 2018-05-30 NOTE — Telephone Encounter (Signed)
Primary Pulmonologist: [name] Last office visit and with whom: 05/12/18 with Derl Barrow NP What do we see them for (pulmonary problems): asthma, cough, bronchitis  Reason for call: patient called in with concerns about possible symptoms of COVID19. Patient stated that starting Wednesday night she had episodes of vomiting and diarrhea with severe abdominal pain. Thursday she was ok with no symptoms. Friday patient showed symptoms of dry non productive cough and a fever of 99.0. Saturday patients symptoms consisted of the same dry cough with fever of 99.0. Sunday patient had same dry cough with fever of 99.3. This morning patient has the same dry cough with a fever of 99.0. Patient denies traveling and she has not been around anyone who has traveled. Patient also stated that she has not been to any recent hospitals or outings. Patient complains of extreme fatigue and weakness. Patient has also complaints of SOB. Patient has body aches and chills. Patient stated that she has not taken anything over the counter for this. Patient would like to be tested for COVID19.  In the last month, have you been in contact with someone who was confirmed or suspected to have Conoravirus / COVID-19?  Patient answered no Have you traveled internationally or to an area with more than 100 reported cases of Coronavirus / COVID-19? [place] state, [place], [place], [place], [place], [place], [place], [place], [place], [place], [place], [place], [place], [place], [place], [place], [place], [place]  Patient answered no  Do you have any of the following symptoms developed in the last 30 days? Fever: yes 99.3 the hightest Cough: yes dry cough Shortness of breath: yes   When did your symptoms start?  Patient stated Wednesday night 05/25/18  If the patient has a fever, what is the last reading?  (use n/a if patient denies fever)  Last reading of 99.0 . IF THE PATIENT STATES THEY DO NOT OWN A  THERMOMETER, THEY MUST GO AND PURCHASE ONE When did the fever start?: fever started 05/27/18 Have you taken any medication to suppress a fever (ie Ibuprofen, Aleve, Tylenol)?: patient denies taking any medications for symptoms   TRIAGE :: REMIND PATIENT TO SELF-ISOLATE WHILE THEY'RE MESSAGE IS BEING HANDLED  (examples of things to ask: : When did symptoms start? Fever? Cough? Productive? Color to sputum? More sputum than usual? Wheezing? Are you having any chest pain?  Have you needed increased oxygen? Are you taking your respiratory medications? What over the counter measures have you tried?)

## 2018-05-31 ENCOUNTER — Encounter: Payer: Self-pay | Admitting: Gastroenterology

## 2018-05-31 NOTE — Telephone Encounter (Signed)
Attempted to call pt but unable to reach. Left message for pt to return call. 

## 2018-06-01 NOTE — Telephone Encounter (Signed)
Called patient unable to reach LMTCB 

## 2018-06-01 NOTE — Telephone Encounter (Signed)
Pt is returning call. Cb is [phone]

## 2018-06-01 NOTE — Telephone Encounter (Signed)
Patient is returning phone call. Patient phone number is [phone].  

## 2018-06-01 NOTE — Telephone Encounter (Signed)
LMTCB

## 2018-06-01 NOTE — Telephone Encounter (Signed)
ATC pt, no answer. Left message for pt to call back.  

## 2018-06-02 NOTE — Telephone Encounter (Signed)
LMTCB x2 for pt 

## 2018-06-03 NOTE — Telephone Encounter (Signed)
Called and spoke with pt who stated the phone number that pt was given for [facility] after the televisit was not the correct number when she called to see if she needed to be tested for Covid. While speaking with pt, pt stated she did not think that she needed the correct number as she was feeling much better than she was when the televisit was done 3/23/ stated to pt if she did become worse to call us back and we could see what we could do to help. Nothing further needed.

## 2018-06-07 DIAGNOSIS — F411 Generalized anxiety disorder: Secondary | ICD-10-CM | POA: Diagnosis not present

## 2018-06-07 DIAGNOSIS — F319 Bipolar disorder, unspecified: Secondary | ICD-10-CM | POA: Diagnosis not present

## 2018-06-07 DIAGNOSIS — Z8659 Personal history of other mental and behavioral disorders: Secondary | ICD-10-CM | POA: Diagnosis not present

## 2018-06-07 DIAGNOSIS — F4310 Post-traumatic stress disorder, unspecified: Secondary | ICD-10-CM | POA: Diagnosis not present

## 2018-06-23 ENCOUNTER — Ambulatory Visit: Payer: Medicare Other | Admitting: Family Medicine

## 2018-06-25 ENCOUNTER — Encounter: Payer: Self-pay | Admitting: Cardiology

## 2018-06-25 NOTE — Telephone Encounter (Signed)
This encounter was created in error - please disregard.

## 2018-06-28 DIAGNOSIS — F411 Generalized anxiety disorder: Secondary | ICD-10-CM | POA: Diagnosis not present

## 2018-06-28 DIAGNOSIS — F4310 Post-traumatic stress disorder, unspecified: Secondary | ICD-10-CM | POA: Diagnosis not present

## 2018-06-28 DIAGNOSIS — Z8659 Personal history of other mental and behavioral disorders: Secondary | ICD-10-CM | POA: Diagnosis not present

## 2018-06-28 DIAGNOSIS — F319 Bipolar disorder, unspecified: Secondary | ICD-10-CM | POA: Diagnosis not present

## 2018-06-29 ENCOUNTER — Encounter: Payer: Self-pay | Admitting: Gastroenterology

## 2018-07-11 ENCOUNTER — Ambulatory Visit: Payer: Medicare Other | Admitting: Nurse Practitioner

## 2018-07-14 ENCOUNTER — Telehealth: Payer: Self-pay

## 2018-07-14 NOTE — Telephone Encounter (Signed)
PLEASE CALL PT. IT SOUNDS LIKE SHE HAS AN ACUTE VIRAL ILLNESS. IF SHE IS NAUSEATED SHE SHOULD TAKE THE ZOFRAN. CONTINUE PROTONIX BID. DRINK WATER TO KEEP YOUR URINE LIGHT YELLOW. FOLLOW A FULL LIQUID DIET FOR 3 DAYS. BUTTERY BREAD WILL MAKE REFLUX AND GASTRITIS FLARE.  Full Liquid Diet Breads and Starches  Allowed: None are allowed   Avoid: Any others.    Potatoes/Pasta/Rice  Allowed: ANY ITEM AS A SOUP OR SMALL PLATE OF MASHED POTATOES OR SCRAMBLED EGGS. (DO NOT EAT MORE THAN ONE SERVING ON THE DAY BEFORE COLONOSCOPY).      Vegetables  Allowed: Strained tomato or vegetable juice. Vegetables pureed in soup.   Avoid: Any others.    Fruit  Allowed: Any strained fruit juices and fruit drinks. Include 1 serving of citrus or vitamin C-enriched fruit juice daily.   Avoid: Any others.  Meat and Meat Substitutes  Allowed: Egg  Avoid: Any meat, fish, or fowl. All cheese.  Milk  Allowed: SOY OR ALMOND Milk beverages, including NUTRITIONAL shakes and instant breakfast mixes. Smooth yogurt.   Avoid: Any others. Avoid dairy products if not tolerated.    Soups and Combination Foods  Allowed: Broth, strained cream soups. Strained, broth-based soups.   Avoid: Any others.    Desserts and Sweets  Allowed: flavored gelatin, tapioca, ice cream, sherbet, smooth pudding, junket, fruit ices, frozen ice pops, pudding pops, frozen fudge pops, chocolate syrup. Sugar, honey, jelly, syrup.   Avoid: Any others.  Fats and Oils  Allowed: Margarine, butter, cream, sour cream, oils.   Avoid: Any others.  Beverages  Allowed: All.   Avoid: None.  Condiments  Allowed: Iodized salt, pepper, spices, flavorings. Cocoa powder.   Avoid: Any others.    SAMPLE MEAL PLAN Breakfast   cup orange juice.   1 OR 2 EGGS  1 cup milk.   1 cup beverage (coffee or tea).   Cream or sugar, if desired.    Midmorning Snack  2 SCRAMBLED OR HARD BOILED EGG   Lunch  1 cup cream soup.     cup fruit juice.   1 cup milk.    cup custard.   1 cup beverage (coffee or tea).   Cream or sugar, if desired.    Midafternoon Snack  1 cup milk shake.  Dinner  1 cup cream soup.    cup fruit juice.   1 cup MILK    cup pudding.   1 cup beverage (coffee or tea).   Cream or sugar, if desired.  Evening Snack  1 cup supplement.  To increase calories, add sugar, cream, butter, or margarine if possible. Nutritional supplements will also increase the total calories.

## 2018-07-14 NOTE — Telephone Encounter (Signed)
Lmom, waiting on a return call.  

## 2018-07-14 NOTE — Telephone Encounter (Signed)
Pt called with c/o being sick since 9 PM Tuesday night, upper gastric pain, nausea without vomiting. Pt feels like she needs vomit and hasn't. Pt has Zofran and hasn't taken it. Pt also had a fever 101.2 around 3 PM yesterday. Pt took a Hydrocodone due to being in so much pain. Pt states she had gastritis 25 years ago and was given Hydrocodone. Pt say her temperature is currently 100.5. pt doesn't have an appetite and isn't drinking any fluids. She finally had a piece of buttery bread which caused abdominal pain. Pt's last good meal was Tuesday.

## 2018-07-14 NOTE — Telephone Encounter (Signed)
PLEASE CALL PT. THE PAIN IS LIKELY BOWEL SPASM OR IRRITATION OF THE STOMACH LINING. IF SHE IS CONCERNED SHE SHOULD GO TO THE ED. OTHERWISE USE HER NORCO IF NEEDED FOR ABDOMINAL PAIN.

## 2018-07-14 NOTE — Telephone Encounter (Signed)
Pt is aware of Sf's recommendations. Pt said she is more concerned with the pain she was having. I explained to pt with the gastritis, she can have some pain. Pt would like another message sent to [facility] about abdominal pain.

## 2018-07-15 ENCOUNTER — Other Ambulatory Visit (HOSPITAL_COMMUNITY)
Admission: RE | Admit: 2018-07-15 | Discharge: 2018-07-15 | Disposition: A | Discharge status: Home / Self Care | Payer: Medicare Other | Source: Ambulatory Visit | Attending: Family Medicine | Admitting: Family Medicine

## 2018-07-15 ENCOUNTER — Ambulatory Visit (INDEPENDENT_AMBULATORY_CARE_PROVIDER_SITE_OTHER): Payer: Medicare Other | Admitting: Family Medicine

## 2018-07-15 ENCOUNTER — Other Ambulatory Visit: Payer: Self-pay

## 2018-07-15 ENCOUNTER — Telehealth: Payer: Self-pay | Admitting: *Deleted

## 2018-07-15 DIAGNOSIS — R1084 Generalized abdominal pain: Secondary | ICD-10-CM | POA: Diagnosis not present

## 2018-07-15 LAB — BASIC METABOLIC PANEL
Anion gap: 11 (ref 5–15)
BUN: 11 mg/dL (ref 8–23)
CO2: 27 mmol/L (ref 22–32)
Calcium: 9.2 mg/dL (ref 8.9–10.3)
Chloride: 97 mmol/L — ABNORMAL LOW (ref 98–111)
Creatinine, Ser: 0.69 mg/dL (ref 0.44–1.00)
GFR calc Af Amer: >60 mL/min (ref >60)
GFR calc non Af Amer: >60 mL/min (ref >60)
Glucose, Bld: 110 mg/dL — ABNORMAL HIGH (ref 70–99)
Potassium: 4.1 mmol/L (ref 3.5–5.1)
Sodium: 135 mmol/L (ref 135–145)

## 2018-07-15 LAB — CBC WITH DIFFERENTIAL/PLATELET
Abs Immature Granulocytes: 0.02 10*3/uL (ref 0.00–0.07)
Basophils Absolute: 0.1 10*3/uL (ref 0.0–0.1)
Basophils Relative: 1 %
Eosinophils Absolute: 0.1 10*3/uL (ref 0.0–0.5)
Eosinophils Relative: 1 %
HCT: 42.3 % (ref 36.0–46.0)
Hemoglobin: 13.7 g/dL (ref 12.0–15.0)
Immature Granulocytes: 0 %
Lymphocytes Relative: 22 %
Lymphs Abs: 2.4 10*3/uL (ref 0.7–4.0)
MCH: 29 pg (ref 26.0–34.0)
MCHC: 32.4 g/dL (ref 30.0–36.0)
MCV: 89.6 fL (ref 80.0–100.0)
Monocytes Absolute: 0.9 10*3/uL (ref 0.1–1.0)
Monocytes Relative: 8 %
Neutro Abs: 7.6 10*3/uL (ref 1.7–7.7)
Neutrophils Relative %: 68 %
Platelets: 282 10*3/uL (ref 150–400)
RBC: 4.72 MIL/uL (ref 3.87–5.11)
RDW: 13.2 % (ref 11.5–15.5)
WBC: 11 10*3/uL — ABNORMAL HIGH (ref 4.0–10.5)
nRBC: 0 % (ref 0.0–0.2)

## 2018-07-15 LAB — HEPATIC FUNCTION PANEL
ALT: 21 U/L (ref 0–44)
AST: 18 U/L (ref 15–41)
Albumin: 4 g/dL (ref 3.5–5.0)
Alkaline Phosphatase: 91 U/L (ref 38–126)
Bilirubin, Direct: 0.2 mg/dL (ref 0.0–0.2)
Indirect Bilirubin: 0.6 mg/dL (ref 0.3–0.9)
Total Bilirubin: 0.8 mg/dL (ref 0.3–1.2)
Total Protein: 7.9 g/dL (ref 6.5–8.1)

## 2018-07-15 LAB — LIPASE, BLOOD: Lipase: 48 U/L (ref 11–51)

## 2018-07-15 MED ORDER — SUCRALFATE 1 G PO TABS: 1.0000 g | ORAL_TABLET | Freq: Three times a day (TID) | ORAL | 2 refills | Status: DC

## 2018-07-15 NOTE — Progress Notes (Signed)
   Subjective:    Patient ID: Jenna Contreras, female    DOB: 23-Mar-1945, 73 y.o.   MRN: [id number]  HPIabdominal pain for 5 days, fever between 99 and 101.5. nausea. History of gastritis. Abdominal pain under right side of ribs area is swollen and tender to the touch. Not able to eat or drink much. Had half a cup of rice last night.   She relates fever earlier this week since then it is come and gone there is tenderness pain and discomfort in the right upper quadrant region and epigastric region she called her gastroenterologist and they told her that she probably had a virus she states she is not been able to keep anything down recently denies sweats chills fever cough congestion.  She takes care of her husband who has dementia she does not want to go to the ER does not want to be admitted      Objective:   Physical Exam Vitals signs reviewed.  Constitutional:      General: She is not in acute distress. HENT:     Head: Normocephalic.  Cardiovascular:     Rate and Rhythm: Normal rate and regular rhythm.     Heart sounds: Normal heart sounds. No murmur.  Pulmonary:     Effort: Pulmonary effort is normal.     Breath sounds: Normal breath sounds.  Abdominal:     Palpations: Abdomen is soft.     Tenderness: There is abdominal tenderness. There is no guarding.  Lymphadenopathy:     Cervical: No cervical adenopathy.  Neurological:     Mental Status: She is alert.  Psychiatric:        Behavior: Behavior normal.     25 minutes was spent with the patient.  This statement verifies that 25 minutes was indeed spent with the patient.  More than 50% of this visit-total duration of the visit-was spent in counseling and coordination of care. The issues that the patient came in for today as reflected in the diagnosis (s) please refer to documentation for further details.   Tenderness in epigastric region right upper quadrant 25 minutes was spent with the patient.  This statement  verifies that 25 minutes was indeed spent with the patient.  More than 50% of this visit-total duration of the visit-was spent in counseling and coordination of care. The issues that the patient came in for today as reflected in the diagnosis (s) please refer to documentation for further details.     Assessment & Plan:  Severe abdominal pain Stat labs ordered Patient does not want to go to the ER Await the results of all these testing   Addendum-lab work came back lipase liver functions look good white blood count minimally elevated we will try adding Carafate to what she is doing if she is not doing better by early next week then progress forward with ultrasound I do not feel patient needs CAT scan at this point the patient was warned that if she gets progressively worse over the weekend very important to go to ER she voiced understanding

## 2018-07-15 NOTE — Telephone Encounter (Signed)
Would like a refill on flonase that was not on her med list. States she takes one spray each nare bid and refill on protonix that is on her med list as once daily but pt states she is taking bid.  [facility]

## 2018-07-18 MED ORDER — FLUTICASONE PROPIONATE 50 MCG/ACT NA SUSP: 2.0000 | Freq: Every day | NASAL | 3 refills | Status: DC

## 2018-07-18 MED ORDER — PANTOPRAZOLE SODIUM 40 MG PO TBEC: 40.0000 mg | DELAYED_RELEASE_TABLET | Freq: Every day | ORAL | 3 refills | Status: DC

## 2018-07-18 NOTE — Telephone Encounter (Signed)
Prescriptions sent electronically to pharmacy. Patent notified  Patient states she was told to call for an update today Patient states she has no fever today and her last fever was 99 yesterday. The pain is still there but mild and the swelling is still there but mild. Patient states she is not in the acute distress she was in Friday when she was seen.

## 2018-07-18 NOTE — Telephone Encounter (Signed)
I would recommend Carafate 1 g, 1 3 times daily, #90 If the patient's discomfort gradually resolves over the next week no additional intervention necessary But if her symptoms are worsening next step would be is ultrasound of the area-epigastric and right upper quadrant- as well as consultation with gastroenterology

## 2018-07-18 NOTE — Telephone Encounter (Signed)
Pt states that Carafate has been sent in over the weekend. Informed pt that if pain gradually resolves then no further intervention needed. If pain gets worse, an ultrasound and GI referral would by appropriate. Pt verbalized understanding.

## 2018-07-18 NOTE — Telephone Encounter (Signed)
She may have Flonase 2 sprays each nostril daily 71-month supply with 3 refills  Protonix is 1 daily, even gastroenterologist stated 1 daily, #90 take one daily with 3 refills  (If the patient feels she needs to have twice daily dosing she should get this approved through gastroenterology)

## 2018-07-18 NOTE — Telephone Encounter (Signed)
Lmom, waiting on a return call.  

## 2018-08-12 DIAGNOSIS — Z8659 Personal history of other mental and behavioral disorders: Secondary | ICD-10-CM | POA: Diagnosis not present

## 2018-08-12 DIAGNOSIS — F4310 Post-traumatic stress disorder, unspecified: Secondary | ICD-10-CM | POA: Diagnosis not present

## 2018-08-12 DIAGNOSIS — F319 Bipolar disorder, unspecified: Secondary | ICD-10-CM | POA: Diagnosis not present

## 2018-08-12 DIAGNOSIS — F411 Generalized anxiety disorder: Secondary | ICD-10-CM | POA: Diagnosis not present

## 2018-08-22 ENCOUNTER — Ambulatory Visit: Payer: Medicare Other | Admitting: Primary Care

## 2018-09-01 ENCOUNTER — Other Ambulatory Visit: Payer: Self-pay | Admitting: Family Medicine

## 2018-09-01 ENCOUNTER — Telehealth: Payer: Self-pay | Admitting: *Deleted

## 2018-09-01 MED ORDER — HYDROCODONE-ACETAMINOPHEN 10-325 MG PO TABS: ORAL_TABLET | ORAL | 0 refills | Status: DC

## 2018-09-01 NOTE — Telephone Encounter (Signed)
Patient would like a refill on pain medication °

## 2018-09-01 NOTE — Telephone Encounter (Signed)
15-day prescription was sent in to her drugstore She needs to do schedule virtual visit within the next 2 weeks in order for [place] to do pain management visit

## 2018-09-02 NOTE — Telephone Encounter (Signed)
Left message to return call to notify patient and schedule virtual visit with Dr Nicki Reaper

## 2018-09-07 NOTE — Telephone Encounter (Signed)
Contacted patient. Pt verbalized understanding. Pt states she will call us back to schedule an appt.

## 2018-09-26 ENCOUNTER — Ambulatory Visit: Payer: Medicare Other | Admitting: Primary Care

## 2018-10-03 ENCOUNTER — Other Ambulatory Visit: Payer: Self-pay | Admitting: Family Medicine

## 2018-10-03 ENCOUNTER — Telehealth: Payer: Self-pay | Admitting: Family Medicine

## 2018-10-03 MED ORDER — HYDROCODONE-ACETAMINOPHEN 10-325 MG PO TABS: ORAL_TABLET | ORAL | 0 refills | Status: DC

## 2018-10-03 NOTE — Telephone Encounter (Signed)
(  Husband has a phone visit this afternoon)  Patient made an appt for 10-13-18.  Needs a refill on HYDROcodone-acetaminophen (NORCO) 10-325 MG tablet   [facility]

## 2018-10-03 NOTE — Telephone Encounter (Signed)
Prescription was sent in.

## 2018-10-03 NOTE — Telephone Encounter (Signed)
Last pain management 03/23/18

## 2018-10-13 ENCOUNTER — Other Ambulatory Visit: Payer: Self-pay

## 2018-10-13 ENCOUNTER — Ambulatory Visit (INDEPENDENT_AMBULATORY_CARE_PROVIDER_SITE_OTHER): Payer: Medicare Other | Admitting: Family Medicine

## 2018-10-13 DIAGNOSIS — Z1159 Encounter for screening for other viral diseases: Secondary | ICD-10-CM | POA: Diagnosis not present

## 2018-10-13 DIAGNOSIS — E038 Other specified hypothyroidism: Secondary | ICD-10-CM | POA: Diagnosis not present

## 2018-10-13 DIAGNOSIS — M2550 Pain in unspecified joint: Secondary | ICD-10-CM | POA: Diagnosis not present

## 2018-10-13 DIAGNOSIS — D72829 Elevated white blood cell count, unspecified: Secondary | ICD-10-CM | POA: Diagnosis not present

## 2018-10-13 MED ORDER — HYDROCODONE-ACETAMINOPHEN 10-325 MG PO TABS: ORAL_TABLET | ORAL | 0 refills | Status: DC

## 2018-10-13 NOTE — Progress Notes (Signed)
Subjective:    Patient ID: Jenna Contreras, female    DOB: 1946/02/22, 73 y.o.   MRN: [id number]  HPI This patient was seen today for chronic pain  The medication list was reviewed and updated. This patient has chronic back pain she has had major surgery never quite recovered very nice patient she tries hard she only uses 2 pain pills a day drug registry checked she is very reliable does not abuse her medicines She is on psychiatric medicines but she states the combination of her medicines do not cause drowsiness and she is able to function  -Compliance with medication: takes one bid  - Number patient states they take daily: somedays she does not take any and some days she takes one bid.   -when was the last dose patient took?   The patient was advised the importance of maintaining medication and not using illegal substances with these.  Here for refills and follow up  The patient was educated that we can provide 3 monthly scripts for their medication, it is their responsibility to follow the instructions.  Side effects or complications from medications: none  Patient is aware that pain medications are meant to minimize the severity of the pain to allow their pain levels to improve to allow for better function. They are aware of that pain medications cannot totally remove their pain.  Due for UDT ( at least once per year) : last one 12/15/17.  Hurt right shoulder about 2 weeks ago and feels like she tore something. Can only move arm a certain way.   Virtual Visit via Telephone Note  I connected with Jenna Contreras on 10/13/18 at  9:00 AM EDT by telephone and verified that I am speaking with the correct person using two identifiers.  Location: Patient: home Provider: office   I discussed the limitations, risks, security and privacy concerns of performing an evaluation and management service by telephone and the availability of in person appointments. I also  discussed with the patient that there may be a patient responsible charge related to this service. The patient expressed understanding and agreed to proceed.   History of Present Illness:    Observations/Objective:   Assessment and Plan:   Follow Up Instructions:    I discussed the assessment and treatment plan with the patient. The patient was provided an opportunity to ask questions and all were answered. The patient agreed with the plan and demonstrated an understanding of the instructions.   The patient was advised to call back or seek an in-person evaluation if the symptoms worsen or if the condition fails to improve as anticipated.  I provided 17  minutes of non-face-to-face time during this encounter.          Review of Systems  Constitutional: Negative for activity change, appetite change and fatigue.  HENT: Negative for congestion and rhinorrhea.   Respiratory: Negative for cough and shortness of breath.   Cardiovascular: Negative for chest pain and leg swelling.  Gastrointestinal: Negative for abdominal pain and diarrhea.  Endocrine: Negative for polydipsia and polyphagia.  Skin: Negative for color change.  Neurological: Negative for dizziness and weakness.  Psychiatric/Behavioral: Negative for behavioral problems and confusion.       Objective:   Physical Exam  Today's visit was via telephone Physical exam was not possible for this visit       Assessment & Plan:  This patient has significant pain her prescriptions were sent in drug registry checked The patient was seen  in followup for chronic pain. A review over at their current pain status was discussed. Drug registry was checked. Prescriptions were given. Discussion was held regarding the importance of compliance with medication as well as pain medication contract.  Time for questions regarding pain management plan occurred. Importance of regular followup visits was discussed. Patient was  informed that medication may cause drowsiness and should not be combined  with other medications/alcohol or street drugs. Patient was cautioned that medication could cause drowsiness. If the patient feels medication is causing altered alertness then do not drive or operate dangerous equipment.  Her next checkup will need to be in person  Does need lab work because of arthritic issues as well as her previous CBC showed leukocytosis plus also follow-up on TSH because of fatigue

## 2018-11-04 ENCOUNTER — Other Ambulatory Visit: Payer: Self-pay | Admitting: Family Medicine

## 2018-11-17 ENCOUNTER — Encounter: Payer: Self-pay | Admitting: Family Medicine

## 2018-11-17 ENCOUNTER — Ambulatory Visit (INDEPENDENT_AMBULATORY_CARE_PROVIDER_SITE_OTHER): Payer: Medicare Other | Admitting: Family Medicine

## 2018-11-17 ENCOUNTER — Other Ambulatory Visit: Payer: Self-pay

## 2018-11-17 VITALS — BP 134/84 | Temp 97.8°F | Wt 131.8 lb

## 2018-11-17 DIAGNOSIS — M25511 Pain in right shoulder: Secondary | ICD-10-CM

## 2018-11-17 MED ORDER — LEVOTHYROXINE SODIUM 88 MCG PO TABS: 88.0000 ug | ORAL_TABLET | Freq: Every day | ORAL | 1 refills | Status: DC

## 2018-11-17 MED ORDER — DICLOFENAC SODIUM 75 MG PO TBEC: 75.0000 mg | DELAYED_RELEASE_TABLET | Freq: Two times a day (BID) | ORAL | 1 refills | Status: DC

## 2018-11-17 NOTE — Progress Notes (Signed)
   Subjective:    Patient ID: Jenna Contreras, female    DOB: [birth year], 73 y.o.   MRN: [id number]  Shoulder Pain  The pain is present in the right shoulder. This is a new problem. Episode onset: 4 weeks ago  Associated symptoms include a limited range of motion. Treatments tried: Voltaren tablets. The treatment provided mild relief.  Patient relates pain discomfort in the shoulder region.  She states that it hurts with certain movements.  This been going on for the past few weeks.  She denies any previous injuries She does state that it has given her significant setbacks recently which is frustrating it makes it difficult for her to care for herself.  PMH benign.  She has seen Dr. Amedeo Plenty before.   Patient under a lot of stress because her husband has end-stage heart failure with dementia end-stage illness Review of Systems  Constitutional: Negative for activity change, appetite change and fatigue.  HENT: Negative for congestion and rhinorrhea.   Respiratory: Negative for cough and shortness of breath.   Cardiovascular: Negative for chest pain and leg swelling.  Gastrointestinal: Negative for abdominal pain and diarrhea.  Endocrine: Negative for polydipsia and polyphagia.  Musculoskeletal: Positive for arthralgias. Negative for back pain.  Skin: Negative for color change.  Neurological: Negative for dizziness and weakness.  Psychiatric/Behavioral: Negative for behavioral problems and confusion.       Objective:   Physical Exam On physical exam patient has osteoarthritis evidence in her hands and MCP joint Patient also has discomfort in the right shoulder with decreased range of motion       Assessment & Plan:  Shoulder pain Probable strain of rotator cuff Exercises to be shown May use Voltaren twice daily as needed for the next week or 2 I also recommend consultation with surgical specialist we will help set up with Dr. Amedeo Plenty

## 2018-11-30 ENCOUNTER — Encounter: Payer: Self-pay | Admitting: Family Medicine

## 2018-12-08 DIAGNOSIS — M25511 Pain in right shoulder: Secondary | ICD-10-CM | POA: Diagnosis not present

## 2018-12-08 DIAGNOSIS — M5030 Other cervical disc degeneration, unspecified cervical region: Secondary | ICD-10-CM | POA: Diagnosis not present

## 2018-12-08 DIAGNOSIS — M545 Low back pain: Secondary | ICD-10-CM | POA: Diagnosis not present

## 2018-12-12 DIAGNOSIS — M65331 Trigger finger, right middle finger: Secondary | ICD-10-CM | POA: Diagnosis not present

## 2018-12-12 DIAGNOSIS — M65341 Trigger finger, right ring finger: Secondary | ICD-10-CM | POA: Diagnosis not present

## 2018-12-12 DIAGNOSIS — M180 Bilateral primary osteoarthritis of first carpometacarpal joints: Secondary | ICD-10-CM | POA: Diagnosis not present

## 2018-12-13 DIAGNOSIS — M542 Cervicalgia: Secondary | ICD-10-CM | POA: Diagnosis not present

## 2018-12-13 DIAGNOSIS — Z981 Arthrodesis status: Secondary | ICD-10-CM | POA: Diagnosis not present

## 2018-12-13 DIAGNOSIS — M5030 Other cervical disc degeneration, unspecified cervical region: Secondary | ICD-10-CM | POA: Diagnosis not present

## 2018-12-13 DIAGNOSIS — M961 Postlaminectomy syndrome, not elsewhere classified: Secondary | ICD-10-CM | POA: Diagnosis not present

## 2018-12-13 DIAGNOSIS — M5412 Radiculopathy, cervical region: Secondary | ICD-10-CM | POA: Diagnosis not present

## 2018-12-17 DIAGNOSIS — M5412 Radiculopathy, cervical region: Secondary | ICD-10-CM | POA: Diagnosis not present

## 2018-12-20 DIAGNOSIS — M546 Pain in thoracic spine: Secondary | ICD-10-CM | POA: Diagnosis not present

## 2018-12-20 DIAGNOSIS — M5412 Radiculopathy, cervical region: Secondary | ICD-10-CM | POA: Diagnosis not present

## 2018-12-20 DIAGNOSIS — M545 Low back pain: Secondary | ICD-10-CM | POA: Diagnosis not present

## 2018-12-23 DIAGNOSIS — M25511 Pain in right shoulder: Secondary | ICD-10-CM | POA: Diagnosis not present

## 2018-12-28 DIAGNOSIS — Z961 Presence of intraocular lens: Secondary | ICD-10-CM | POA: Diagnosis not present

## 2018-12-28 DIAGNOSIS — H00025 Hordeolum internum left lower eyelid: Secondary | ICD-10-CM | POA: Diagnosis not present

## 2019-01-04 DIAGNOSIS — M25512 Pain in left shoulder: Secondary | ICD-10-CM | POA: Diagnosis not present

## 2019-01-04 DIAGNOSIS — M25511 Pain in right shoulder: Secondary | ICD-10-CM | POA: Diagnosis not present

## 2019-01-04 DIAGNOSIS — M75101 Unspecified rotator cuff tear or rupture of right shoulder, not specified as traumatic: Secondary | ICD-10-CM | POA: Diagnosis not present

## 2019-01-04 DIAGNOSIS — M19011 Primary osteoarthritis, right shoulder: Secondary | ICD-10-CM | POA: Diagnosis not present

## 2019-01-11 ENCOUNTER — Ambulatory Visit (HOSPITAL_COMMUNITY): Payer: Medicare Other | Admitting: Occupational Therapy

## 2019-01-11 ENCOUNTER — Other Ambulatory Visit: Payer: Self-pay

## 2019-01-11 ENCOUNTER — Encounter (HOSPITAL_COMMUNITY): Payer: Self-pay

## 2019-01-11 DIAGNOSIS — H00025 Hordeolum internum left lower eyelid: Secondary | ICD-10-CM | POA: Diagnosis not present

## 2019-01-11 DIAGNOSIS — H04123 Dry eye syndrome of bilateral lacrimal glands: Secondary | ICD-10-CM | POA: Diagnosis not present

## 2019-01-11 DIAGNOSIS — H18513 Endothelial corneal dystrophy, bilateral: Secondary | ICD-10-CM | POA: Diagnosis not present

## 2019-01-11 DIAGNOSIS — Z961 Presence of intraocular lens: Secondary | ICD-10-CM | POA: Diagnosis not present

## 2019-01-16 ENCOUNTER — Telehealth: Payer: Self-pay | Admitting: Family Medicine

## 2019-01-16 ENCOUNTER — Telehealth (HOSPITAL_COMMUNITY): Payer: Self-pay | Admitting: Specialist

## 2019-01-16 ENCOUNTER — Ambulatory Visit (HOSPITAL_COMMUNITY): Payer: Medicare Other | Attending: Orthopedic Surgery | Admitting: Specialist

## 2019-01-16 DIAGNOSIS — R29898 Other symptoms and signs involving the musculoskeletal system: Secondary | ICD-10-CM | POA: Insufficient documentation

## 2019-01-16 DIAGNOSIS — M25511 Pain in right shoulder: Secondary | ICD-10-CM | POA: Insufficient documentation

## 2019-01-16 NOTE — Telephone Encounter (Signed)
Pt states she was seen at [facility]age tent and the pain was severe and she was running a fever but she was better after 5 days. She was told if she didn't get better he would run some test. Pain came back last Wednesday and it was for 3 days with nausea. She states this has been going on for 10 years off and on. She states she is fine today just wants to find out what is causing it States she now has someone to stay with carl during the day so she can now go for testing

## 2019-01-16 NOTE — Telephone Encounter (Signed)
So ideally I would like to see this patient in the office this week for further evaluation

## 2019-01-16 NOTE — Telephone Encounter (Signed)
Pt was called and waiting for a return phone call to confirm she will be here or we will cx all future apptments. NF 11/9/12020

## 2019-01-16 NOTE — Telephone Encounter (Signed)
Discussed with pt and transferred to the front to schedule visit this week

## 2019-01-16 NOTE — Telephone Encounter (Signed)
Pt forgot about today's apptment and she promised she will be hrere  on 11/17/202

## 2019-01-16 NOTE — Telephone Encounter (Signed)
Patient is having abdominal pain again.She states last week she had them for 3 days. Please advise

## 2019-01-18 ENCOUNTER — Encounter: Payer: Self-pay | Admitting: Family Medicine

## 2019-01-18 ENCOUNTER — Other Ambulatory Visit: Payer: Self-pay

## 2019-01-18 ENCOUNTER — Ambulatory Visit (INDEPENDENT_AMBULATORY_CARE_PROVIDER_SITE_OTHER): Payer: Medicare Other | Admitting: Family Medicine

## 2019-01-18 DIAGNOSIS — R1010 Upper abdominal pain, unspecified: Secondary | ICD-10-CM

## 2019-01-18 DIAGNOSIS — R1011 Right upper quadrant pain: Secondary | ICD-10-CM | POA: Diagnosis not present

## 2019-01-18 DIAGNOSIS — R1013 Epigastric pain: Secondary | ICD-10-CM

## 2019-01-18 MED ORDER — SUCRALFATE 1 G PO TABS: 1.0000 g | ORAL_TABLET | Freq: Three times a day (TID) | ORAL | 2 refills | Status: DC

## 2019-01-18 NOTE — Progress Notes (Signed)
   Subjective:    Patient ID: Jenna Contreras, female    DOB: September 05, 1945, 73 y.o.   MRN: [id number]  Abdominal Pain This is a recurrent problem. The current episode started 1 to 4 weeks ago. The pain is located in the generalized abdominal region. Associated symptoms include nausea. Pertinent negatives include no vomiting. Treatments tried: Phenergen suppository  The treatment provided mild relief.  Severe epigastric abdominal pain dysmenorrhea occurring over the past 2 years has seen gastroenterology they did a endoscopy last year they also did a CAT scan now patient is having severe epigastric pain multiple times a week nausea with it no vomiting recently does wake her up at night with severe abdominal pain that radiates to her back is not in the lower abdomen.  No weight loss but has difficult appetite when she gets this problem finds her self feeling nauseated a lot.  PMH benign under a lot of stress with her husband  Review of Systems  Constitutional: Negative for activity change and appetite change.  HENT: Negative for congestion and rhinorrhea.   Respiratory: Negative for cough and shortness of breath.   Cardiovascular: Negative for chest pain and leg swelling.  Gastrointestinal: Positive for abdominal pain and nausea. Negative for vomiting.  Skin: Negative for color change.  Neurological: Negative for dizziness and weakness.  Psychiatric/Behavioral: Negative for agitation and confusion.       Objective:   Physical Exam Vitals signs reviewed.  Constitutional:      General: She is not in acute distress. HENT:     Head: Normocephalic and atraumatic.  Eyes:     General:        Right eye: No discharge.        Left eye: No discharge.  Neck:     Trachea: No tracheal deviation.  Cardiovascular:     Rate and Rhythm: Normal rate and regular rhythm.     Heart sounds: Normal heart sounds. No murmur.  Pulmonary:     Effort: Pulmonary effort is normal. No respiratory distress.      Breath sounds: Normal breath sounds.  Lymphadenopathy:     Cervical: No cervical adenopathy.  Skin:    General: Skin is warm and dry.  Neurological:     Mental Status: She is alert.     Coordination: Coordination normal.  Psychiatric:        Behavior: Behavior normal.    Abdomen significant upper abdomen abdominal pain and discomfort with palpation no guarding rebound or masses felt       Assessment & Plan:  Severe abdominal pain Lab work indicated Carafate as well as PPI Avoid NSAIDs Follow-up again in a few weeks time for recheck CT scan upper abdomen recommended await the results Need to rule out the possibility of a pancreatic issue given her age and nighttime awakening with abdominal pain CAT scan is indicated

## 2019-01-19 LAB — CBC WITH DIFFERENTIAL/PLATELET
Basophils Absolute: 0.1 10*3/uL (ref 0.0–0.2)
Basos: 1 %
EOS (ABSOLUTE): 0.2 10*3/uL (ref 0.0–0.4)
Eos: 2 %
Hematocrit: 44.3 % (ref 34.0–46.6)
Hemoglobin: 14.5 g/dL (ref 11.1–15.9)
Immature Grans (Abs): 0 10*3/uL (ref 0.0–0.1)
Immature Granulocytes: 0 %
Lymphocytes Absolute: 2.6 10*3/uL (ref 0.7–3.1)
Lymphs: 26 %
MCH: 29.4 pg (ref 26.6–33.0)
MCHC: 32.7 g/dL (ref 31.5–35.7)
MCV: 90 fL (ref 79–97)
Monocytes Absolute: 0.7 10*3/uL (ref 0.1–0.9)
Monocytes: 7 %
Neutrophils Absolute: 6.4 10*3/uL (ref 1.4–7.0)
Neutrophils: 64 %
Platelets: 362 10*3/uL (ref 150–450)
RBC: 4.93 x10E6/uL (ref 3.77–5.28)
RDW: 13.6 % (ref 11.7–15.4)
WBC: 9.9 10*3/uL (ref 3.4–10.8)

## 2019-01-19 LAB — LIPASE: Lipase: 19 U/L (ref 14–85)

## 2019-01-19 LAB — BASIC METABOLIC PANEL
BUN/Creatinine Ratio: 29 — ABNORMAL HIGH (ref 12–28)
BUN: 20 mg/dL (ref 8–27)
CO2: 23 mmol/L (ref 20–29)
Calcium: 9.8 mg/dL (ref 8.7–10.3)
Chloride: 101 mmol/L (ref 96–106)
Creatinine, Ser: 0.7 mg/dL (ref 0.57–1.00)
GFR calc Af Amer: 99 mL/min/{1.73_m2} (ref >59)
GFR calc non Af Amer: 86 mL/min/{1.73_m2} (ref >59)
Glucose: 84 mg/dL (ref 65–99)
Potassium: 4.6 mmol/L (ref 3.5–5.2)
Sodium: 140 mmol/L (ref 134–144)

## 2019-01-19 LAB — HEPATIC FUNCTION PANEL
ALT: 14 IU/L (ref 0–32)
AST: 16 IU/L (ref 0–40)
Albumin: 4.8 g/dL — ABNORMAL HIGH (ref 3.7–4.7)
Alkaline Phosphatase: 87 IU/L (ref 39–117)
Bilirubin Total: 0.5 mg/dL (ref 0.0–1.2)
Bilirubin, Direct: 0.16 mg/dL (ref 0.00–0.40)
Total Protein: 7.2 g/dL (ref 6.0–8.5)

## 2019-01-20 ENCOUNTER — Other Ambulatory Visit: Payer: Self-pay

## 2019-01-20 ENCOUNTER — Encounter (HOSPITAL_COMMUNITY): Payer: Self-pay

## 2019-01-20 ENCOUNTER — Ambulatory Visit (HOSPITAL_COMMUNITY): Payer: Medicare Other | Admitting: Specialist

## 2019-01-24 ENCOUNTER — Encounter (HOSPITAL_COMMUNITY): Payer: Self-pay | Admitting: Occupational Therapy

## 2019-01-24 ENCOUNTER — Other Ambulatory Visit: Payer: Self-pay

## 2019-01-24 ENCOUNTER — Ambulatory Visit (HOSPITAL_COMMUNITY): Payer: Medicare Other | Admitting: Occupational Therapy

## 2019-01-24 DIAGNOSIS — M25511 Pain in right shoulder: Secondary | ICD-10-CM | POA: Diagnosis not present

## 2019-01-24 DIAGNOSIS — R29898 Other symptoms and signs involving the musculoskeletal system: Secondary | ICD-10-CM | POA: Diagnosis not present

## 2019-01-24 DIAGNOSIS — Z23 Encounter for immunization: Secondary | ICD-10-CM | POA: Diagnosis not present

## 2019-01-24 NOTE — Patient Instructions (Signed)
  1) Flexion Wall Stretch    Face wall, place affected handon wall in front of you. Slide hand up the wall  and lean body in towards the wall. Hold for 10 seconds. Repeat 3-5 times. 1-2 times/day.     2) Towel Stretch with Internal Rotation   Or     Gently pull up (or to the side) your affected arm  behind your back with the assist of a towel. Hold 10 seconds, repeat 3-5 times. 1-2 times/day.             3) Corner Stretch    Stand at a corner of a wall, place your arms on the walls with elbows bent. Lean into the corner until a stretch is felt along the front of your chest and/or shoulders. Hold for 10 seconds. Repeat 3-5X, 1-2 times/day.    4) Posterior Capsule Stretch    Bring the involved arm across chest. Grasp elbow and pull toward chest until you feel a stretch in the back of the upper arm and shoulder. Hold 10 seconds. Repeat 3-5X. Complete 1-2 times/day.    5) Scapular Retraction    Tuck chin back as you pinch shoulder blades together.  Hold 5 seconds. Repeat 3-5X. Complete 1-2 times/day.    6) External Rotation Stretch:     Place your affected hand on the wall with the elbow bent and gently turn your body the opposite direction until a stretch is felt. Hold 10 seconds, repeat 3-5X. Complete 1-2 times/day.     

## 2019-01-24 NOTE — Therapy (Signed)
[place], [place], [address] Phone: [phone]   Fax:  [phone]  Occupational Therapy Evaluation  Patient Details  Name: Jenna Contreras MRN: [id number] Date of Birth: 1946-01-21 Referring Provider (OT): Dr. Netta Cedars   Encounter Date: 01/24/2019  OT End of Session - 01/24/19 1257    Visit Number  1    Number of Visits  8    Date for OT Re-Evaluation  02/23/19    Authorization Type  1) Medicare A & B  2) [employer] for Lift    Authorization Time Period  progress note at visit 10    Authorization - Visit Number  1    Authorization - Number of Visits  10    OT Start Time  [phone]    OT Stop Time  1027    OT Time Calculation (min)  37 min    Activity Tolerance  Patient tolerated treatment well    Behavior During Therapy  [facility] for tasks assessed/performed       Past Medical History:  Diagnosis Date  . Anxiety   . Arthritis   . Arthritis   . Asthma   . Bipolar 2 disorder ([place])   . Cancer (HCC)    Basal Cell  . Constipation   . Depression   . Fibromyalgia   . GERD (gastroesophageal reflux disease)   . Heart murmur    As small child   . History of gout   . History of skin cancer   . HNP (herniated nucleus pulposus with myelopathy), thoracic   . Hypothyroidism   . Left eye injury    In ED 06/17/2014   . Low BP   . Migraines   . OCD (obsessive compulsive disorder)   . Osteoporosis   . Psoriasis   . Scoliosis     Past Surgical History:  Procedure Laterality Date  . Arthroscopic left knee surgery    . BIOPSY  10/28/2017   Procedure: BIOPSY;  Surgeon: Danie Binder, MD;  Location: [facility];  Service: Endoscopy;;  duodenal biopsy , gastric biopsy   . DILATION AND CURETTAGE OF UTERUS  x3  . ESOPHAGOGASTRODUODENOSCOPY (EGD) WITH PROPOFOL N/A 10/28/2017   Procedure: ESOPHAGOGASTRODUODENOSCOPY (EGD) WITH PROPOFOL;  Surgeon: Danie Binder, MD;  Location: [facility];  Service: Endoscopy;   Laterality: N/A;  8:15am  . HIP SURGERY Right 2015  . iliac wings    . KNEE SURGERY Bilateral 2015, 2016  . MANDIBLE SURGERY     X 2  . PARTIAL KNEE ARTHROPLASTY Left 02/12/2014   Procedure: LEFT KNEE UNICOMPARTMENTAL MEDIALLY ARTHROPLASTY ;  Surgeon: Mauri Pole, MD;  Location: [facility];  Service: Orthopedics;  Laterality: Left;  . PARTIAL KNEE ARTHROPLASTY Right 07/02/2014   Procedure: RIGHT UNI KNEE ARTHOPLASTY MEDIALLY;  Surgeon: Paralee Cancel, MD;  Location: [facility];  Service: Orthopedics;  Laterality: Right;  . rod in spine     scoliosis   . TONSILLECTOMY    . TOTAL HIP ARTHROPLASTY Right 05/09/2013   Procedure: RIGHT TOTAL HIP ARTHROPLASTY ANTERIOR APPROACH;  Surgeon: Mauri Pole, MD;  Location: [facility];  Service: Orthopedics;  Laterality: Right;  . TUBAL LIGATION    . WRIST SURGERY      There were no vitals filed for this visit.  Subjective Assessment - 01/24/19 1254    Subjective   S: I have two tears in my rotator cuff.    Pertinent History  Pt is a  73 y/o female presenting with right shoulder pain after injuring her shoulder in late August 2020. Pt reports she had an MRI and has 2 torn RC tendons in her shoulder. Pt was referred to occupational therapy for evaluation and treatment by Dr. Netta Cedars.    Special Tests  FOTO: 52/100   Patient Stated Goals  To have less pain in my shoulder and be able to reach more.    Currently in Pain?  Yes    Pain Score  3     Pain Location  Shoulder    Pain Orientation  Right    Pain Descriptors / Indicators  Aching;Sore    Pain Type  Acute pain    Pain Radiating Towards  N/A    Pain Onset  1 to 4 weeks ago    Pain Frequency  Intermittent    Aggravating Factors   movement, lifting    Pain Relieving Factors  rest, heat    Effect of Pain on Daily Activities  mod effect on ADLs, pt pushes through    Multiple Pain Sites  No        [facility] OT Assessment - 01/24/19 0954      Assessment   Medical Diagnosis  right shoulder RC tears     Referring Provider (OT)  Dr. Netta Cedars    Onset Date/Surgical Date  11/07/18    Hand Dominance  Right    Next MD Visit  unsure    Prior Therapy  None      Precautions   Precautions  None      Restrictions   Weight Bearing Restrictions  No      Balance Screen   Has the patient fallen in the past 6 months  No    Has the patient had a decrease in activity level because of a fear of falling?   No    Is the patient reluctant to leave their home because of a fear of falling?   No      Prior Function   Level of Independence  Independent    Vocation  Retired;On disability    Leisure  None. Full time caregiver      ADL   ADL comments  Pt is having difficulty with lifting pots/pans, reaching overhead and behind back, dressing tasks-shirts/jackets, driving. Sleeping is very painful       Written Expression   Dominant Hand  Right      Observation/Other Assessments   Focus on Therapeutic Outcomes (FOTO)   52/100      ROM / Strength   AROM / PROM / Strength  AROM;PROM;Strength      Palpation   Palpation comment  min/mod fascial restrictions in upper arm, trapezius, and scapular regions      AROM   Overall AROM Comments  Assessed seated, er/IR adducted    AROM Assessment Site  Shoulder    Right/Left Shoulder  Right    Right Shoulder Flexion  143 Degrees    Right Shoulder ABduction  117 Degrees    Right Shoulder Internal Rotation  90 Degrees    Right Shoulder External Rotation  71 Degrees      PROM   Overall PROM Comments  Assessed supine, er/IR adducted    PROM Assessment Site  Shoulder    Right/Left Shoulder  Right    Right Shoulder Flexion  150 Degrees    Right Shoulder ABduction  130 Degrees    Right Shoulder Internal Rotation  90 Degrees  Right Shoulder External Rotation  70 Degrees      Strength   Overall Strength Comments  Assessed seated, er/IR adducted    Strength Assessment Site  Shoulder    Right/Left Shoulder  Right    Right Shoulder Flexion  4+/5    Right  Shoulder ABduction  4/5    Right Shoulder Internal Rotation  4+/5    Right Shoulder External Rotation  4+/5                      OT Education - 01/24/19 1018    Education Details  shoulder stretches    Person(s) Educated  Patient    Methods  Explanation;Demonstration;Handout    Comprehension  Verbalized understanding;Returned demonstration       OT Short Term Goals - 01/24/19 1307      OT SHORT TERM GOAL #1   Title  Pt will be provided with and educated on HEP to improve mobility in RUE required for ADL completion.    Time  4    Period  Weeks    Status  New    Target Date  02/23/19      OT SHORT TERM GOAL #2   Title  Pt will decrease RUE fascial restrictions to trace amounts to improve mobility required for functional reaching tasks.    Time  4    Period  Weeks    Status  New      OT SHORT TERM GOAL #3   Title  Pt will decrease pain in RUE to 3/10 or less to increase ability to perform dressing and bathing tasks.    Time  4    Period  Weeks    Status  New      OT SHORT TERM GOAL #4   Title  Pt will increase A/ROM to WNL to improve ability to reach into overhead cabinets.    Time  4    Period  Weeks    Status  New      OT SHORT TERM GOAL #5   Title  Pt will improved RUE strength to 4+/5 or greater to improve ability to perform lightweight lifting tasks.    Time  4    Period  Weeks    Status  New               Plan - 01/24/19 1259    Clinical Impression Statement  A: Pt is a 73 y/o female presenting with right shoulder pain secondary to two torn rotator cuff tendons. Pt presents with RUE deficits limiting functional use of RUE during ADLs. Pt also has LUE pain at this time due to recent suspected left shoulder injury.    OT Occupational Profile and History  Problem Focused Assessment - Including review of records relating to presenting problem    Occupational performance deficits (Please refer to evaluation for details):  ADL's;IADL's;Rest and  Sleep;Leisure    Body Structure / Function / Physical Skills  ADL;Endurance;UE functional use;Fascial restriction;Pain;ROM;IADL;Strength    Rehab Potential  Good    Clinical Decision Making  Limited treatment options, no task modification necessary    Comorbidities Affecting Occupational Performance:  None    Modification or Assistance to Complete Evaluation   No modification of tasks or assist necessary to complete eval    OT Frequency  2x / week    OT Duration  4 weeks    OT Treatment/Interventions  Self-care/ADL training;Ultrasound;Patient/family education;Passive range of motion;Cryotherapy;Electrical Stimulation;Moist Heat;Therapeutic exercise;Manual  Therapy;Therapeutic activities    Plan  P: Pt will benefit from skilled OT services to decrease pain and fascial restrictions, increase RUE ROM, strength, and functional use during ADLs. Treatment plan: myofascial release, P/ROM, A/ROM, shoulder stretches, general RUE strengthening, modalities prn    Consulted and Agree with Plan of Care  Patient       Patient will benefit from skilled therapeutic intervention in order to improve the following deficits and impairments:   Body Structure / Function / Physical Skills: ADL, Endurance, UE functional use, Fascial restriction, Pain, ROM, IADL, Strength       Visit Diagnosis: Acute pain of right shoulder  Other symptoms and signs involving the musculoskeletal system    Problem List Patient Active Problem List   Diagnosis Date Noted  . Upper respiratory tract infection 05/30/2018  . Viral illness 05/30/2018  . Dyspepsia   . Epigastric pain 09/18/2015  . FH: colon cancer 09/18/2015  . Osteoporosis 05/20/2015  . Moderate single current episode of major depressive disorder ([place]) 02/18/2015  . S/P right UKR 07/02/2014  . Status post unilateral knee replacement 07/02/2014  . Asthma, mild intermittent 02/06/2014  . Lung nodule 02/06/2014  . Hyperlipidemia 01/18/2014  . Expected blood loss  anemia 05/10/2013  . Overweight (BMI 25.0-29.9) 05/10/2013  . S/P right THA, AA 05/09/2013  . Pre-operative respiratory examination 05/07/2013  . Post-nasal drainage 05/07/2013  . Endometrial polyp 02/28/2013  . Osteoarthritis of right hip 02/15/2013  . Precordial pain 12/07/2012  . Chronic back pain 08/17/2012  . Hypothyroid 08/17/2012  . Osteopenia 08/17/2012  . Hypersomnia 11/18/2011  . Asthma, mild persistent 08/05/2011  . Memory change 08/05/2011  . TIA (transient ischemic attack) 08/05/2011  . Pulmonary nodule 08/05/2011  . SOB (shortness of breath) 01/05/2011   Guadelupe Sabin, OTR/L  [phone] 01/24/2019, 1:12 PM  [facility] [address] [place], [place], [address] Phone: [phone]   Fax:  [phone]  Name: Ebelyn Lesner MRN: [id number] Date of Birth: 1946-03-07

## 2019-01-27 ENCOUNTER — Other Ambulatory Visit: Payer: Self-pay

## 2019-01-27 ENCOUNTER — Encounter (HOSPITAL_COMMUNITY): Payer: Self-pay | Admitting: Occupational Therapy

## 2019-01-27 ENCOUNTER — Ambulatory Visit (HOSPITAL_COMMUNITY): Payer: Medicare Other | Admitting: Occupational Therapy

## 2019-01-27 DIAGNOSIS — M25511 Pain in right shoulder: Secondary | ICD-10-CM

## 2019-01-27 DIAGNOSIS — R29898 Other symptoms and signs involving the musculoskeletal system: Secondary | ICD-10-CM | POA: Diagnosis not present

## 2019-01-27 NOTE — Therapy (Signed)
[place], [place], [address] Phone: [phone]   Fax:  [phone]  Occupational Therapy Treatment  Patient Details  Name: Jenna Contreras MRN: [id number] Date of Birth: 1945/10/24 Referring Provider (OT): Dr. Netta Cedars   Encounter Date: 01/27/2019  OT End of Session - 01/27/19 1031    Visit Number  2    Number of Visits  8    Date for OT Re-Evaluation  02/23/19    Authorization Type  1) Medicare A & B  2) [employer]    Authorization Time Period  progress note at visit 10    Authorization - Visit Number  2    Authorization - Number of Visits  10    OT Start Time  [phone]    OT Stop Time  1030    OT Time Calculation (min)  40 min    Activity Tolerance  Patient tolerated treatment well    Behavior During Therapy  [facility] for tasks assessed/performed       Past Medical History:  Diagnosis Date  . Anxiety   . Arthritis   . Arthritis   . Asthma   . Bipolar 2 disorder ([place])   . Cancer (HCC)    Basal Cell  . Constipation   . Depression   . Fibromyalgia   . GERD (gastroesophageal reflux disease)   . Heart murmur    As small child   . History of gout   . History of skin cancer   . HNP (herniated nucleus pulposus with myelopathy), thoracic   . Hypothyroidism   . Left eye injury    In ED 06/17/2014   . Low BP   . Migraines   . OCD (obsessive compulsive disorder)   . Osteoporosis   . Psoriasis   . Scoliosis     Past Surgical History:  Procedure Laterality Date  . Arthroscopic left knee surgery    . BIOPSY  10/28/2017   Procedure: BIOPSY;  Surgeon: Danie Binder, MD;  [facility];  Service: Endoscopy;;  duodenal biopsy , gastric biopsy   . DILATION AND CURETTAGE OF UTERUS  x3  . ESOPHAGOGASTRODUODENOSCOPY (EGD) WITH PROPOFOL N/A 10/28/2017   Procedure: ESOPHAGOGASTRODUODENOSCOPY (EGD) WITH PROPOFOL;  Surgeon: Danie Binder, MD;  [facility];  Service: Endoscopy;   Laterality: N/A;  8:15am  . HIP SURGERY Right 2015  . iliac wings    . KNEE SURGERY Bilateral 2015, 2016  . MANDIBLE SURGERY     X 2  . PARTIAL KNEE ARTHROPLASTY Left 02/12/2014   Procedure: LEFT KNEE UNICOMPARTMENTAL MEDIALLY ARTHROPLASTY ;  Surgeon: Mauri Pole, MD;  [facility];  Service: Orthopedics;  Laterality: Left;  . PARTIAL KNEE ARTHROPLASTY Right 07/02/2014   Procedure: RIGHT UNI KNEE ARTHOPLASTY MEDIALLY;  Surgeon: Paralee Cancel, MD;  [facility];  Service: Orthopedics;  Laterality: Right;  . rod in spine     scoliosis   . TONSILLECTOMY    . TOTAL HIP ARTHROPLASTY Right 05/09/2013   Procedure: RIGHT TOTAL HIP ARTHROPLASTY ANTERIOR APPROACH;  Surgeon: Mauri Pole, MD;  [facility];  Service: Orthopedics;  Laterality: Right;  . TUBAL LIGATION    . WRIST SURGERY      There were no vitals filed for this visit.  Subjective Assessment - 01/27/19 0951    Subjective   S: It did hurt when I washed my hair this morning.    Currently in Pain?  No/denies         [facility] OT Assessment - 01/27/19 0952      Assessment   Medical Diagnosis  right shoulder RC tears      Precautions   Precautions  None               OT Treatments/Exercises (OP) - 01/27/19 0952      Exercises   Exercises  Shoulder      Shoulder Exercises: Supine   Protraction  PROM;5 reps;AROM;10 reps    Horizontal ABduction  PROM;5 reps;AROM;10 reps    External Rotation  PROM;5 reps;AROM;10 reps    Internal Rotation  PROM;5 reps;AROM;10 reps    Flexion  PROM;5 reps;AROM;10 reps    ABduction  PROM;5 reps;AROM;10 reps      Shoulder Exercises: Seated   Elevation  AROM;10 reps    Extension  AROM;10 reps    Row  AROM;10 reps    Other Seated Exercises  scapular depression, A/ROM, 10X      Shoulder Exercises: Therapy Ball   Flexion  10 reps    ABduction  10 reps      Shoulder Exercises: ROM/Strengthening   Thumb Tacks  1'    Prot/Ret//Elev/Dep  1'      Manual Therapy   Manual  Therapy  Myofascial release    Manual therapy comments  completed separately from therapeutic exercises     Myofascial Release  myofascial release to right upper arm, trapezius, and scapular regions to decrease pain and fascial restrictions and increase joint ROM               OT Short Term Goals - 01/27/19 1011      OT SHORT TERM GOAL #1   Title  Pt will be provided with and educated on HEP to improve mobility in RUE required for ADL completion.    Time  4    Period  Weeks    Status  On-going    Target Date  02/23/19      OT SHORT TERM GOAL #2   Title  Pt will decrease RUE fascial restrictions to trace amounts to improve mobility required for functional reaching tasks.    Time  4    Period  Weeks    Status  On-going      OT SHORT TERM GOAL #3   Title  Pt will decrease pain in RUE to 3/10 or less to increase ability to perform dressing and bathing tasks.    Time  4    Period  Weeks    Status  On-going      OT SHORT TERM GOAL #4   Title  Pt will increase A/ROM to WNL to improve ability to reach into overhead cabinets.    Time  4    Period  Weeks    Status  On-going      OT SHORT TERM GOAL #5   Title  Pt will improved RUE strength to 4+/5 or greater to improve ability to perform lightweight lifting tasks.    Time  4    Period  Weeks    Status  On-going               Plan - 01/27/19 1011    Clinical Impression Statement  A: Initiated myofascial release, manual techniques to address fascial restrictions causing pain and limiting ROM this session. Completed passive stretching, pt tolerating approximately 75% ROM. Also initiated A/ROM in supine, scapular A/ROM, and therapy ball stretches. Verbal  cuing for form and technique.    Body Structure / Function / Physical Skills  ADL;Endurance;UE functional use;Fascial restriction;Pain;ROM;IADL;Strength    Plan  P: Add A/ROM seated or standing, add proximal shoulder strengthening in supine       Patient will benefit  from skilled therapeutic intervention in order to improve the following deficits and impairments:   Body Structure / Function / Physical Skills: ADL, Endurance, UE functional use, Fascial restriction, Pain, ROM, IADL, Strength       Visit Diagnosis: Acute pain of right shoulder  Other symptoms and signs involving the musculoskeletal system    Problem List Patient Active Problem List   Diagnosis Date Noted  . Upper respiratory tract infection 05/30/2018  . Viral illness 05/30/2018  . Dyspepsia   . Epigastric pain 09/18/2015  . FH: colon cancer 09/18/2015  . Osteoporosis 05/20/2015  . Moderate single current episode of major depressive disorder ([place]) 02/18/2015  . S/P right UKR 07/02/2014  . Status post unilateral knee replacement 07/02/2014  . Asthma, mild intermittent 02/06/2014  . Lung nodule 02/06/2014  . Hyperlipidemia 01/18/2014  . Expected blood loss anemia 05/10/2013  . Overweight (BMI 25.0-29.9) 05/10/2013  . S/P right THA, AA 05/09/2013  . Pre-operative respiratory examination 05/07/2013  . Post-nasal drainage 05/07/2013  . Endometrial polyp 02/28/2013  . Osteoarthritis of right hip 02/15/2013  . Precordial pain 12/07/2012  . Chronic back pain 08/17/2012  . Hypothyroid 08/17/2012  . Osteopenia 08/17/2012  . Hypersomnia 11/18/2011  . Asthma, mild persistent 08/05/2011  . Memory change 08/05/2011  . TIA (transient ischemic attack) 08/05/2011  . Pulmonary nodule 08/05/2011  . SOB (shortness of breath) 01/05/2011   Guadelupe Sabin, OTR/L  [phone] 01/27/2019, 10:32 AM  [place] [address] [place], [place], [address] Phone: [phone]   Fax:  [phone]  Name: Jenna Contreras MRN: [id number] Date of Birth: [birth year]

## 2019-01-30 ENCOUNTER — Other Ambulatory Visit: Payer: Self-pay

## 2019-01-30 ENCOUNTER — Ambulatory Visit (HOSPITAL_COMMUNITY): Payer: Medicare Other | Admitting: Occupational Therapy

## 2019-01-30 ENCOUNTER — Encounter (HOSPITAL_COMMUNITY): Payer: Self-pay | Admitting: Occupational Therapy

## 2019-01-30 DIAGNOSIS — R29898 Other symptoms and signs involving the musculoskeletal system: Secondary | ICD-10-CM | POA: Diagnosis not present

## 2019-01-30 DIAGNOSIS — M25511 Pain in right shoulder: Secondary | ICD-10-CM | POA: Diagnosis not present

## 2019-01-30 NOTE — Therapy (Signed)
[place], [place], [address] Phone: [phone]   Fax:  [phone]  Occupational Therapy Treatment  Patient Details  Name: Jenna Contreras MRN: [id number] Date of Birth: 10-27-1945 Referring Provider (OT): Dr. Netta Cedars   Encounter Date: 01/30/2019  OT End of Session - 01/30/19 1512    Visit Number  3    Number of Visits  8    Date for OT Re-Evaluation  02/23/19    Authorization Type  1) Medicare A & B  2) [employer]    Authorization Time Period  progress note at visit 10    Authorization - Visit Number  3    Authorization - Number of Visits  10    OT Start Time  [id number]    OT Stop Time  1516    OT Time Calculation (min)  44 min    Activity Tolerance  Patient tolerated treatment well    Behavior During Therapy  [facility] for tasks assessed/performed       Past Medical History:  Diagnosis Date  . Anxiety   . Arthritis   . Arthritis   . Asthma   . Bipolar 2 disorder ([place])   . Cancer (HCC)    Basal Cell  . Constipation   . Depression   . Fibromyalgia   . GERD (gastroesophageal reflux disease)   . Heart murmur    As small child   . History of gout   . History of skin cancer   . HNP (herniated nucleus pulposus with myelopathy), thoracic   . Hypothyroidism   . Left eye injury    In ED 06/17/2014   . Low BP   . Migraines   . OCD (obsessive compulsive disorder)   . Osteoporosis   . Psoriasis   . Scoliosis     Past Surgical History:  Procedure Laterality Date  . Arthroscopic left knee surgery    . BIOPSY  10/28/2017   Procedure: BIOPSY;  Surgeon: Danie Binder, MD;  [facility];  Service: Endoscopy;;  duodenal biopsy , gastric biopsy   . DILATION AND CURETTAGE OF UTERUS  x3  . ESOPHAGOGASTRODUODENOSCOPY (EGD) WITH PROPOFOL N/A 10/28/2017   Procedure: ESOPHAGOGASTRODUODENOSCOPY (EGD) WITH PROPOFOL;  Surgeon: Danie Binder, MD;  [facility];  Service: Endoscopy;   Laterality: N/A;  8:15am  . HIP SURGERY Right 2015  . iliac wings    . KNEE SURGERY Bilateral 2015, 2016  . MANDIBLE SURGERY     X 2  . PARTIAL KNEE ARTHROPLASTY Left 02/12/2014   Procedure: LEFT KNEE UNICOMPARTMENTAL MEDIALLY ARTHROPLASTY ;  Surgeon: Mauri Pole, MD;  [facility];  Service: Orthopedics;  Laterality: Left;  . PARTIAL KNEE ARTHROPLASTY Right 07/02/2014   Procedure: RIGHT UNI KNEE ARTHOPLASTY MEDIALLY;  Surgeon: Paralee Cancel, MD;  [facility];  Service: Orthopedics;  Laterality: Right;  . rod in spine     scoliosis   . TONSILLECTOMY    . TOTAL HIP ARTHROPLASTY Right 05/09/2013   Procedure: RIGHT TOTAL HIP ARTHROPLASTY ANTERIOR APPROACH;  Surgeon: Mauri Pole, MD;  [facility];  Service: Orthopedics;  Laterality: Right;  . TUBAL LIGATION    . WRIST SURGERY      There were no vitals filed for this visit.  Subjective Assessment - 01/30/19 1433    Subjective   S: I hurt all weekend.    Currently in Pain?  Yes    Pain Score  8     Pain Location  Shoulder    Pain Orientation  Right    Pain Descriptors / Indicators  Sore    Pain Type  Acute pain    Pain Radiating Towards  N/A    Pain Onset  1 to 4 weeks ago    Pain Frequency  Intermittent    Aggravating Factors   movement, lifting    Pain Relieving Factors  rest, heat    Effect of Pain on Daily Activities  mod effect on ADLs, pt pushes through    Multiple Pain Sites  No         [facility] OT Assessment - 01/30/19 1433      Assessment   Medical Diagnosis  right shoulder RC tears      Precautions   Precautions  None               OT Treatments/Exercises (OP) - 01/30/19 1435      Exercises   Exercises  Shoulder      Shoulder Exercises: Supine   Protraction  PROM;5 reps;AROM;10 reps    Horizontal ABduction  PROM;5 reps;AROM;10 reps    External Rotation  PROM;5 reps;AROM;10 reps    Internal Rotation  PROM;5 reps;AROM;10 reps    Flexion  PROM;5 reps;AROM;10 reps    ABduction  PROM;5  reps;AROM;10 reps      Shoulder Exercises: Seated   Row  AROM;10 reps    Protraction  AROM;10 reps    Horizontal ABduction  AROM;10 reps    Flexion  AROM;10 reps      Shoulder Exercises: ROM/Strengthening   Proximal Shoulder Strengthening, Supine  10X each no rest breaks      Manual Therapy   Manual Therapy  Myofascial release    Manual therapy comments  completed separately from therapeutic exercises     Myofascial Release  myofascial release to right upper arm, trapezius, and scapular regions to decrease pain and fascial restrictions and increase joint ROM               OT Short Term Goals - 01/27/19 1011      OT SHORT TERM GOAL #1   Title  Pt will be provided with and educated on HEP to improve mobility in RUE required for ADL completion.    Time  4    Period  Weeks    Status  On-going    Target Date  02/23/19      OT SHORT TERM GOAL #2   Title  Pt will decrease RUE fascial restrictions to trace amounts to improve mobility required for functional reaching tasks.    Time  4    Period  Weeks    Status  On-going      OT SHORT TERM GOAL #3   Title  Pt will decrease pain in RUE to 3/10 or less to increase ability to perform dressing and bathing tasks.    Time  4    Period  Weeks    Status  On-going      OT SHORT TERM GOAL #4   Title  Pt will increase A/ROM to WNL to improve ability to reach into overhead cabinets.    Time  4    Period  Weeks    Status  On-going      OT SHORT TERM GOAL #5   Title  Pt will improved RUE strength to 4+/5 or greater to improve ability to perform lightweight lifting tasks.    Time  4    Period  Weeks    Status  On-going               Plan - 01/30/19 1510    Clinical Impression Statement  A: Pt reporting soreness since last session. Continued with manual therapy this session, min fascial restrictions palpated. Continued with A/ROM in supine and added in sitting. Also added proximal shoulder strengthening in supine. Verbal  cuing for form and technique.    Body Structure / Function / Physical Skills  ADL;Endurance;UE functional use;Fascial restriction;Pain;ROM;IADL;Strength    Plan  P: Add er/IR and abduction in sitting. Add proximal shoulder strengthening in sitting       Patient will benefit from skilled therapeutic intervention in order to improve the following deficits and impairments:   Body Structure / Function / Physical Skills: ADL, Endurance, UE functional use, Fascial restriction, Pain, ROM, IADL, Strength       Visit Diagnosis: Acute pain of right shoulder  Other symptoms and signs involving the musculoskeletal system    Problem List Patient Active Problem List   Diagnosis Date Noted  . Upper respiratory tract infection 05/30/2018  . Viral illness 05/30/2018  . Dyspepsia   . Epigastric pain 09/18/2015  . FH: colon cancer 09/18/2015  . Osteoporosis 05/20/2015  . Moderate single current episode of major depressive disorder ([place]) 02/18/2015  . S/P right UKR 07/02/2014  . Status post unilateral knee replacement 07/02/2014  . Asthma, mild intermittent 02/06/2014  . Lung nodule 02/06/2014  . Hyperlipidemia 01/18/2014  . Expected blood loss anemia 05/10/2013  . Overweight (BMI 25.0-29.9) 05/10/2013  . S/P right THA, AA 05/09/2013  . Pre-operative respiratory examination 05/07/2013  . Post-nasal drainage 05/07/2013  . Endometrial polyp 02/28/2013  . Osteoarthritis of right hip 02/15/2013  . Precordial pain 12/07/2012  . Chronic back pain 08/17/2012  . Hypothyroid 08/17/2012  . Osteopenia 08/17/2012  . Hypersomnia 11/18/2011  . Asthma, mild persistent 08/05/2011  . Memory change 08/05/2011  . TIA (transient ischemic attack) 08/05/2011  . Pulmonary nodule 08/05/2011  . SOB (shortness of breath) 01/05/2011   Guadelupe Sabin, OTR/L  [phone] 01/30/2019, 3:31 PM  [place], [place], [address] Phone: [phone]    Fax:  [phone]  Name: Jenna Contreras MRN: [id number] Date of Birth: April 10, 1945

## 2019-01-31 ENCOUNTER — Encounter (HOSPITAL_COMMUNITY): Payer: Medicare Other | Admitting: Occupational Therapy

## 2019-01-31 ENCOUNTER — Other Ambulatory Visit: Payer: Self-pay

## 2019-01-31 ENCOUNTER — Telehealth (HOSPITAL_COMMUNITY): Payer: Self-pay | Admitting: Occupational Therapy

## 2019-01-31 NOTE — Telephone Encounter (Signed)
pt called to cx today's appt she thought she did not need two appts back to back.

## 2019-02-01 ENCOUNTER — Other Ambulatory Visit: Payer: Self-pay | Admitting: Family Medicine

## 2019-02-06 ENCOUNTER — Telehealth (HOSPITAL_COMMUNITY): Payer: Self-pay

## 2019-02-06 ENCOUNTER — Encounter (HOSPITAL_COMMUNITY): Payer: Medicare Other

## 2019-02-06 NOTE — Telephone Encounter (Signed)
Attempted to call patient regarding no show. No answer and no way to leave a message as a voicemail system did not kick in. Will inform patient of attendance policy at next appointment.    Ailene Ravel, OTR/L,CBIS  [phone]

## 2019-02-09 ENCOUNTER — Ambulatory Visit (HOSPITAL_COMMUNITY): Payer: Medicare Other

## 2019-02-10 ENCOUNTER — Ambulatory Visit (HOSPITAL_COMMUNITY): Payer: Medicare Other | Attending: Orthopedic Surgery | Admitting: Occupational Therapy

## 2019-02-10 ENCOUNTER — Encounter (HOSPITAL_COMMUNITY): Payer: Self-pay | Admitting: Occupational Therapy

## 2019-02-10 ENCOUNTER — Other Ambulatory Visit: Payer: Self-pay

## 2019-02-10 DIAGNOSIS — M25511 Pain in right shoulder: Secondary | ICD-10-CM | POA: Diagnosis not present

## 2019-02-10 DIAGNOSIS — R29898 Other symptoms and signs involving the musculoskeletal system: Secondary | ICD-10-CM | POA: Diagnosis present

## 2019-02-10 NOTE — Patient Instructions (Signed)

## 2019-02-10 NOTE — Therapy (Signed)
[place], [place], [address] Phone: [phone]   Fax:  [phone]  Occupational Therapy Treatment  Patient Details  Name: Jenna Contreras MRN: [id number] Date of Birth: 04-25-1945 Referring Provider (OT): Dr. Netta Cedars   Encounter Date: 02/10/2019  OT End of Session - 02/10/19 1251    Visit Number  4    Number of Visits  8    Date for OT Re-Evaluation  02/23/19    Authorization Type  1) Medicare A & B  2) [employer]    Authorization Time Period  progress note at visit 10    Authorization - Visit Number  4    Authorization - Number of Visits  10    OT Start Time  1130    OT Stop Time  1200    OT Time Calculation (min)  30 min    Activity Tolerance  Patient tolerated treatment well    Behavior During Therapy  [facility] for tasks assessed/performed       Past Medical History:  Diagnosis Date  . Anxiety   . Arthritis   . Arthritis   . Asthma   . Bipolar 2 disorder ([place])   . Cancer (HCC)    Basal Cell  . Constipation   . Depression   . Fibromyalgia   . GERD (gastroesophageal reflux disease)   . Heart murmur    As small child   . History of gout   . History of skin cancer   . HNP (herniated nucleus pulposus with myelopathy), thoracic   . Hypothyroidism   . Left eye injury    In ED 06/17/2014   . Low BP   . Migraines   . OCD (obsessive compulsive disorder)   . Osteoporosis   . Psoriasis   . Scoliosis     Past Surgical History:  Procedure Laterality Date  . Arthroscopic left knee surgery    . BIOPSY  10/28/2017   Procedure: BIOPSY;  Surgeon: Danie Binder, MD;  [facility];  Service: Endoscopy;;  duodenal biopsy , gastric biopsy   . DILATION AND CURETTAGE OF UTERUS  x3  . ESOPHAGOGASTRODUODENOSCOPY (EGD) WITH PROPOFOL N/A 10/28/2017   Procedure: ESOPHAGOGASTRODUODENOSCOPY (EGD) WITH PROPOFOL;  Surgeon: Danie Binder, MD;  [facility];  Service: Endoscopy;   Laterality: N/A;  8:15am  . HIP SURGERY Right 2015  . iliac wings    . KNEE SURGERY Bilateral 2015, 2016  . MANDIBLE SURGERY     X 2  . PARTIAL KNEE ARTHROPLASTY Left 02/12/2014   Procedure: LEFT KNEE UNICOMPARTMENTAL MEDIALLY ARTHROPLASTY ;  Surgeon: Mauri Pole, MD;  [facility];  Service: Orthopedics;  Laterality: Left;  . PARTIAL KNEE ARTHROPLASTY Right 07/02/2014   Procedure: RIGHT UNI KNEE ARTHOPLASTY MEDIALLY;  Surgeon: Paralee Cancel, MD;  [facility];  Service: Orthopedics;  Laterality: Right;  . rod in spine     scoliosis   . TONSILLECTOMY    . TOTAL HIP ARTHROPLASTY Right 05/09/2013   Procedure: RIGHT TOTAL HIP ARTHROPLASTY ANTERIOR APPROACH;  Surgeon: Mauri Pole, MD;  [facility];  Service: Orthopedics;  Laterality: Right;  . TUBAL LIGATION    . WRIST SURGERY      There were no vitals filed for this visit.  Subjective Assessment - 02/10/19 1132    Subjective   S: It's not so sharp when I reach up now.    Currently in Pain?  Yes  Pain Score  5     Pain Location  Shoulder    Pain Orientation  Right    Pain Descriptors / Indicators  Aching;Sore    Pain Type  Acute pain    Pain Radiating Towards  N/A    Pain Onset  1 to 4 weeks ago    Pain Frequency  Intermittent    Aggravating Factors   movement, lifting    Pain Relieving Factors  rest, heat    Effect of Pain on Daily Activities  mod effect on ADLs, pt pushes through    Multiple Pain Sites  No         [facility] OT Assessment - 02/10/19 1132      Assessment   Medical Diagnosis  right shoulder RC tears      Precautions   Precautions  None               OT Treatments/Exercises (OP) - 02/10/19 1133      Exercises   Exercises  Shoulder      Shoulder Exercises: Supine   Protraction  PROM;5 reps;AROM;10 reps    Horizontal ABduction  PROM;5 reps;AROM;10 reps    External Rotation  PROM;5 reps;AROM;10 reps    Internal Rotation  PROM;5 reps;AROM;10 reps    Flexion  PROM;5 reps;AROM;10  reps    ABduction  PROM;5 reps;AROM;10 reps      Shoulder Exercises: Standing   Protraction  AROM;10 reps    Horizontal ABduction  AROM;10 reps    External Rotation  AROM;10 reps    Internal Rotation  AROM;10 reps    Flexion  AROM;10 reps    ABduction  AROM;10 reps      Shoulder Exercises: ROM/Strengthening   Proximal Shoulder Strengthening, Supine  10X each no rest breaks    Proximal Shoulder Strengthening, Seated  10X each no rest breaks    Other ROM/Strengthening Exercises  proximal shoulder strengthening on door, 1' flexion             OT Education - 02/10/19 1158    Education Details  A/ROM exercises    Person(s) Educated  Patient    Methods  Explanation;Demonstration;Handout    Comprehension  Verbalized understanding;Returned demonstration       OT Short Term Goals - 01/27/19 1011      OT SHORT TERM GOAL #1   Title  Pt will be provided with and educated on HEP to improve mobility in RUE required for ADL completion.    Time  4    Period  Weeks    Status  On-going    Target Date  02/23/19      OT SHORT TERM GOAL #2   Title  Pt will decrease RUE fascial restrictions to trace amounts to improve mobility required for functional reaching tasks.    Time  4    Period  Weeks    Status  On-going      OT SHORT TERM GOAL #3   Title  Pt will decrease pain in RUE to 3/10 or less to increase ability to perform dressing and bathing tasks.    Time  4    Period  Weeks    Status  On-going      OT SHORT TERM GOAL #4   Title  Pt will increase A/ROM to WNL to improve ability to reach into overhead cabinets.    Time  4    Period  Weeks    Status  On-going  OT SHORT TERM GOAL #5   Title  Pt will improved RUE strength to 4+/5 or greater to improve ability to perform lightweight lifting tasks.    Time  4    Period  Weeks    Status  On-going               Plan - 02/10/19 1253    Clinical Impression Statement  A: Pt reports it is easier to reach overhead now.  No manual therapy completed as pt arrived late to session. Continued with A/ROM, added proximal shoulder strengthening in standing at at doorway. Verbal cuing for form and technique during exercises. Pt with improved ROM during exercises, reaching WNL for all tasks.    Body Structure / Function / Physical Skills  ADL;Endurance;UE functional use;Fascial restriction;Pain;ROM;IADL;Strength    Plan  P: Add scapular theraband and x to v arms       Patient will benefit from skilled therapeutic intervention in order to improve the following deficits and impairments:   Body Structure / Function / Physical Skills: ADL, Endurance, UE functional use, Fascial restriction, Pain, ROM, IADL, Strength       Visit Diagnosis: Acute pain of right shoulder  Other symptoms and signs involving the musculoskeletal system    Problem List Patient Active Problem List   Diagnosis Date Noted  . Upper respiratory tract infection 05/30/2018  . Viral illness 05/30/2018  . Dyspepsia   . Epigastric pain 09/18/2015  . FH: colon cancer 09/18/2015  . Osteoporosis 05/20/2015  . Moderate single current episode of major depressive disorder ([place]) 02/18/2015  . S/P right UKR 07/02/2014  . Status post unilateral knee replacement 07/02/2014  . Asthma, mild intermittent 02/06/2014  . Lung nodule 02/06/2014  . Hyperlipidemia 01/18/2014  . Expected blood loss anemia 05/10/2013  . Overweight (BMI 25.0-29.9) 05/10/2013  . S/P right THA, AA 05/09/2013  . Pre-operative respiratory examination 05/07/2013  . Post-nasal drainage 05/07/2013  . Endometrial polyp 02/28/2013  . Osteoarthritis of right hip 02/15/2013  . Precordial pain 12/07/2012  . Chronic back pain 08/17/2012  . Hypothyroid 08/17/2012  . Osteopenia 08/17/2012  . Hypersomnia 11/18/2011  . Asthma, mild persistent 08/05/2011  . Memory change 08/05/2011  . TIA (transient ischemic attack) 08/05/2011  . Pulmonary nodule 08/05/2011  . SOB (shortness of breath)  01/05/2011   Guadelupe Sabin, OTR/L  [phone] 02/10/2019, 12:57 PM  [place] [address] [place], [place], [address] Phone: [phone]   Fax:  [phone]  Name: Amarachukwu Arington MRN: [id number] Date of Birth: July 07, 1945

## 2019-02-15 ENCOUNTER — Encounter (HOSPITAL_COMMUNITY): Payer: Self-pay | Admitting: Occupational Therapy

## 2019-02-15 ENCOUNTER — Ambulatory Visit (HOSPITAL_COMMUNITY): Payer: Medicare Other | Admitting: Occupational Therapy

## 2019-02-15 ENCOUNTER — Other Ambulatory Visit: Payer: Self-pay

## 2019-02-15 DIAGNOSIS — M25511 Pain in right shoulder: Secondary | ICD-10-CM

## 2019-02-15 DIAGNOSIS — R29898 Other symptoms and signs involving the musculoskeletal system: Secondary | ICD-10-CM

## 2019-02-15 NOTE — Therapy (Signed)
[place], [place], [address] Phone: [phone]   Fax:  [phone]  Occupational Therapy Treatment  Patient Details  Name: Jenna Contreras MRN: [id number] Date of Birth: 1945/07/30 Referring Provider (OT): Dr. Netta Cedars   Encounter Date: 02/15/2019  OT End of Session - 02/15/19 1147    Visit Number  5    Number of Visits  8    Date for OT Re-Evaluation  02/23/19    Authorization Type  1) Medicare A & B  2) [employer] for [employer]    Authorization Time Period  progress note at visit 10    Authorization - Visit Number  5    Authorization - Number of Visits  10    OT Start Time  1030    OT Stop Time  1109    OT Time Calculation (min)  39 min    Activity Tolerance  Patient tolerated treatment well    Behavior During Therapy  [facility] for tasks assessed/performed       Past Medical History:  Diagnosis Date  . Anxiety   . Arthritis   . Arthritis   . Asthma   . Bipolar 2 disorder ([place])   . Cancer (HCC)    Basal Cell  . Constipation   . Depression   . Fibromyalgia   . GERD (gastroesophageal reflux disease)   . Heart murmur    As small child   . History of gout   . History of skin cancer   . HNP (herniated nucleus pulposus with myelopathy), thoracic   . Hypothyroidism   . Left eye injury    In ED 06/17/2014   . Low BP   . Migraines   . OCD (obsessive compulsive disorder)   . Osteoporosis   . Psoriasis   . Scoliosis     Past Surgical History:  Procedure Laterality Date  . Arthroscopic left knee surgery    . BIOPSY  10/28/2017   Procedure: BIOPSY;  Surgeon: Danie Binder, MD;  [facility];  Service: Endoscopy;;  duodenal biopsy , gastric biopsy   . DILATION AND CURETTAGE OF UTERUS  x3  . ESOPHAGOGASTRODUODENOSCOPY (EGD) WITH PROPOFOL N/A 10/28/2017   Procedure: ESOPHAGOGASTRODUODENOSCOPY (EGD) WITH PROPOFOL;  Surgeon: Danie Binder, MD;  [facility];  Service: Endoscopy;   Laterality: N/A;  8:15am  . HIP SURGERY Right 2015  . iliac wings    . KNEE SURGERY Bilateral 2015, 2016  . MANDIBLE SURGERY     X 2  . PARTIAL KNEE ARTHROPLASTY Left 02/12/2014   Procedure: LEFT KNEE UNICOMPARTMENTAL MEDIALLY ARTHROPLASTY ;  Surgeon: Mauri Pole, MD;  [facility]: [facility];  Service: Orthopedics;  Laterality: Left;  . PARTIAL KNEE ARTHROPLASTY Right 07/02/2014   Procedure: RIGHT UNI KNEE ARTHOPLASTY MEDIALLY;  Surgeon: Paralee Cancel, MD;  Location: [facility];  Service: Orthopedics;  Laterality: Right;  . rod in spine     scoliosis   . TONSILLECTOMY    . TOTAL HIP ARTHROPLASTY Right 05/09/2013   Procedure: RIGHT TOTAL HIP ARTHROPLASTY ANTERIOR APPROACH;  Surgeon: Mauri Pole, MD;  [facility]: [facility];  Service: Orthopedics;  Laterality: Right;  . TUBAL LIGATION    . WRIST SURGERY      There were no vitals filed for this visit.  Subjective Assessment - 02/15/19 1029    Subjective   S: It woke me up at 5 this morning.    Currently in Pain?  Yes  Pain Score  7     Pain Location  Shoulder    Pain Orientation  Right    Pain Descriptors / Indicators  Aching;Sore    Pain Type  Chronic pain    Pain Radiating Towards  N/A    Pain Onset  1 to 4 weeks ago    Pain Frequency  Intermittent    Aggravating Factors   movement, lifting    Pain Relieving Factors  rest, heat    Effect of Pain on Daily Activities  mod effect on ADLs, pt pushes through    Multiple Pain Sites  No         [facility] OT Assessment - 02/15/19 1029      Assessment   Medical Diagnosis  right shoulder RC tears      Precautions   Precautions  None               OT Treatments/Exercises (OP) - 02/15/19 1034      Exercises   Exercises  Shoulder      Shoulder Exercises: Supine   Protraction  PROM;5 reps;AROM;12 reps    Horizontal ABduction  PROM;5 reps;AROM;12 reps    External Rotation  PROM;5 reps;AROM;12 reps    Internal Rotation  PROM;5 reps;AROM;12 reps    Flexion  PROM;5 reps;AROM;12 reps     ABduction  PROM;5 reps;AROM;12 reps      Shoulder Exercises: Standing   Protraction  AROM;10 reps    Horizontal ABduction  AROM;10 reps    External Rotation  AROM;10 reps    Internal Rotation  AROM;10 reps    Flexion  AROM;10 reps    ABduction  AROM;10 reps      Shoulder Exercises: ROM/Strengthening   UBE (Upper Arm Bike)  Level 1 2' forward    X to V Arms  10X    Proximal Shoulder Strengthening, Supine  12X each no rest breaks    Proximal Shoulder Strengthening, Seated  10X each no rest breaks    Ball on Wall  1' flexion 1' abduction      Manual Therapy   Manual Therapy  Myofascial release    Manual therapy comments  completed separately from therapeutic exercises     Myofascial Release  myofascial release to right upper arm, trapezius, and scapular regions to decrease pain and fascial restrictions and increase joint ROM               OT Short Term Goals - 01/27/19 1011      OT SHORT TERM GOAL #1   Title  Pt will be provided with and educated on HEP to improve mobility in RUE required for ADL completion.    Time  4    Period  Weeks    Status  On-going    Target Date  02/23/19      OT SHORT TERM GOAL #2   Title  Pt will decrease RUE fascial restrictions to trace amounts to improve mobility required for functional reaching tasks.    Time  4    Period  Weeks    Status  On-going      OT SHORT TERM GOAL #3   Title  Pt will decrease pain in RUE to 3/10 or less to increase ability to perform dressing and bathing tasks.    Time  4    Period  Weeks    Status  On-going      OT SHORT TERM GOAL #4   Title  Pt will increase  A/ROM to WNL to improve ability to reach into overhead cabinets.    Time  4    Period  Weeks    Status  On-going      OT SHORT TERM GOAL #5   Title  Pt will improved RUE strength to 4+/5 or greater to improve ability to perform lightweight lifting tasks.    Time  4    Period  Weeks    Status  On-going               Plan - 02/15/19  1148    Clinical Impression Statement  A: Continued with manual therapy to address fascial restrictions in trapezius regions. Pt with passive and A/ROM [facility]. Added x to v arms, ball on wall, and UBE exercises today. Pt reporting mod fatigue at end of session. Verbal cuing for form and technique.    Body Structure / Function / Physical Skills  ADL;Endurance;UE functional use;Fascial restriction;Pain;ROM;IADL;Strength    Plan  P: Add scapular theraband and update HEP       Patient will benefit from skilled therapeutic intervention in order to improve the following deficits and impairments:   Body Structure / Function / Physical Skills: ADL, Endurance, UE functional use, Fascial restriction, Pain, ROM, IADL, Strength       Visit Diagnosis: Acute pain of right shoulder  Other symptoms and signs involving the musculoskeletal system    Problem List Patient Active Problem List   Diagnosis Date Noted  . Upper respiratory tract infection 05/30/2018  . Viral illness 05/30/2018  . Dyspepsia   . Epigastric pain 09/18/2015  . FH: colon cancer 09/18/2015  . Osteoporosis 05/20/2015  . Moderate single current episode of major depressive disorder ([place]) 02/18/2015  . S/P right UKR 07/02/2014  . Status post unilateral knee replacement 07/02/2014  . Asthma, mild intermittent 02/06/2014  . Lung nodule 02/06/2014  . Hyperlipidemia 01/18/2014  . Expected blood loss anemia 05/10/2013  . Overweight (BMI 25.0-29.9) 05/10/2013  . S/P right THA, AA 05/09/2013  . Pre-operative respiratory examination 05/07/2013  . Post-nasal drainage 05/07/2013  . Endometrial polyp 02/28/2013  . Osteoarthritis of right hip 02/15/2013  . Precordial pain 12/07/2012  . Chronic back pain 08/17/2012  . Hypothyroid 08/17/2012  . Osteopenia 08/17/2012  . Hypersomnia 11/18/2011  . Asthma, mild persistent 08/05/2011  . Memory change 08/05/2011  . TIA (transient ischemic attack) 08/05/2011  . Pulmonary nodule 08/05/2011   . SOB (shortness of breath) 01/05/2011   Guadelupe Sabin, OTR/L  [phone] 02/15/2019, 11:50 AM  [facility] [address] [place], [place], [address] Phone: [phone]   Fax:  [phone]  Name: Jenna Contreras MRN: [id number] Date of Birth: Dec 24, 1945

## 2019-02-17 ENCOUNTER — Ambulatory Visit (HOSPITAL_COMMUNITY): Payer: Medicare Other | Admitting: Occupational Therapy

## 2019-02-17 ENCOUNTER — Encounter (HOSPITAL_COMMUNITY): Payer: Self-pay | Admitting: Occupational Therapy

## 2019-02-17 ENCOUNTER — Other Ambulatory Visit: Payer: Self-pay

## 2019-02-17 DIAGNOSIS — M25511 Pain in right shoulder: Secondary | ICD-10-CM | POA: Diagnosis not present

## 2019-02-17 DIAGNOSIS — R29898 Other symptoms and signs involving the musculoskeletal system: Secondary | ICD-10-CM

## 2019-02-17 NOTE — Therapy (Signed)
[place], [place], [address] Phone: [phone]   Fax:  [phone]  Occupational Therapy Treatment  Patient Details  Name: Jenna Contreras MRN: [id number] Date of Birth: 11-10-1945 Referring Provider (OT): Dr. Netta Cedars   Encounter Date: 02/17/2019  OT End of Session - 02/17/19 1259    Visit Number  6    Number of Visits  8    Date for OT Re-Evaluation  02/23/19    Authorization Type  1) Medicare A & B  2) [employer] for [employer]    Authorization Time Period  progress note at visit 10    Authorization - Visit Number  6    Authorization - Number of Visits  10    OT Start Time  1115    OT Stop Time  1200    OT Time Calculation (min)  45 min    Activity Tolerance  Patient tolerated treatment well    Behavior During Therapy  [facility] for tasks assessed/performed       Past Medical History:  Diagnosis Date  . Anxiety   . Arthritis   . Arthritis   . Asthma   . Bipolar 2 disorder ([place])   . Cancer (HCC)    Basal Cell  . Constipation   . Depression   . Fibromyalgia   . GERD (gastroesophageal reflux disease)   . Heart murmur    As small child   . History of gout   . History of skin cancer   . HNP (herniated nucleus pulposus with myelopathy), thoracic   . Hypothyroidism   . Left eye injury    In ED 06/17/2014   . Low BP   . Migraines   . OCD (obsessive compulsive disorder)   . Osteoporosis   . Psoriasis   . Scoliosis     Past Surgical History:  Procedure Laterality Date  . Arthroscopic left knee surgery    . BIOPSY  10/28/2017   Procedure: BIOPSY;  Surgeon: Danie Binder, MD;  [facility];  Service: Endoscopy;;  duodenal biopsy , gastric biopsy   . DILATION AND CURETTAGE OF UTERUS  x3  . ESOPHAGOGASTRODUODENOSCOPY (EGD) WITH PROPOFOL N/A 10/28/2017   Procedure: ESOPHAGOGASTRODUODENOSCOPY (EGD) WITH PROPOFOL;  Surgeon: Danie Binder, MD;  [facility];  Service: Endoscopy;   Laterality: N/A;  8:15am  . HIP SURGERY Right 2015  . iliac wings    . KNEE SURGERY Bilateral 2015, 2016  . MANDIBLE SURGERY     X 2  . PARTIAL KNEE ARTHROPLASTY Left 02/12/2014   Procedure: LEFT KNEE UNICOMPARTMENTAL MEDIALLY ARTHROPLASTY ;  Surgeon: Mauri Pole, MD;  [facility];  Service: Orthopedics;  Laterality: Left;  . PARTIAL KNEE ARTHROPLASTY Right 07/02/2014   Procedure: RIGHT UNI KNEE ARTHOPLASTY MEDIALLY;  Surgeon: Paralee Cancel, MD;  [facility];  Service: Orthopedics;  Laterality: Right;  . rod in spine     scoliosis   . TONSILLECTOMY    . TOTAL HIP ARTHROPLASTY Right 05/09/2013   Procedure: RIGHT TOTAL HIP ARTHROPLASTY ANTERIOR APPROACH;  Surgeon: Mauri Pole, MD;  [facility];  Service: Orthopedics;  Laterality: Right;  . TUBAL LIGATION    . WRIST SURGERY      There were no vitals filed for this visit.  Subjective Assessment - 02/17/19 1112    Subjective   S: I've been on the heating pad a lot.    Currently in Pain?  Yes  Pain Score  6     Pain Location  Shoulder    Pain Orientation  Right    Pain Descriptors / Indicators  Aching;Sore    Pain Type  Chronic pain    Pain Radiating Towards  N/A    Pain Onset  1 to 4 weeks ago    Pain Frequency  Intermittent    Aggravating Factors   movement, lifting    Pain Relieving Factors  rest, heat    Effect of Pain on Daily Activities  mod effect on ADLs, pt pushes through    Multiple Pain Sites  No         [facility] OT Assessment - 02/17/19 1111      Assessment   Medical Diagnosis  right shoulder RC tears      Precautions   Precautions  None               OT Treatments/Exercises (OP) - 02/17/19 1118      Exercises   Exercises  Shoulder      Shoulder Exercises: Supine   Protraction  PROM;5 reps;AROM;12 reps    Horizontal ABduction  PROM;5 reps;AROM;12 reps    External Rotation  PROM;5 reps;AROM;12 reps    Internal Rotation  PROM;5 reps;AROM;12 reps    Flexion  PROM;5 reps;AROM;12  reps    ABduction  PROM;5 reps;AROM;12 reps      Shoulder Exercises: Standing   Protraction  AROM;12 reps    Horizontal ABduction  AROM;12 reps    External Rotation  AROM;12 reps    Internal Rotation  AROM;12 reps    Flexion  AROM;12 reps    ABduction  AROM;12 reps    Extension  Theraband;10 reps    Theraband Level (Shoulder Extension)  Level 2 (Red)    Row  Theraband;10 reps    Theraband Level (Shoulder Row)  Level 2 (Red)    Retraction  Theraband;10 reps    Theraband Level (Shoulder Retraction)  Level 2 (Red)      Shoulder Exercises: ROM/Strengthening   UBE (Upper Arm Bike)  Level 1 2' forward 2' reverse    X to V Arms  10X    Proximal Shoulder Strengthening, Supine  12X each no rest breaks    Proximal Shoulder Strengthening, Seated  12X each no rest breaks      Manual Therapy   Manual Therapy  Myofascial release    Manual therapy comments  completed separately from therapeutic exercises     Myofascial Release  myofascial release to right upper arm, trapezius, and scapular regions to decrease pain and fascial restrictions and increase joint ROM             OT Education - 02/17/19 1301    Education Details  Pt educated on use of tennis ball at scapular border to address fascial restrictions at home.    Person(s) Educated  Patient    Methods  Explanation;Demonstration;Handout    Comprehension  Verbalized understanding;Returned demonstration       OT Short Term Goals - 01/27/19 1011      OT SHORT TERM GOAL #1   Title  Pt will be provided with and educated on HEP to improve mobility in RUE required for ADL completion.    Time  4    Period  Weeks    Status  On-going    Target Date  02/23/19      OT SHORT TERM GOAL #2   Title  Pt will decrease RUE fascial restrictions  to trace amounts to improve mobility required for functional reaching tasks.    Time  4    Period  Weeks    Status  On-going      OT SHORT TERM GOAL #3   Title  Pt will decrease pain in RUE to  3/10 or less to increase ability to perform dressing and bathing tasks.    Time  4    Period  Weeks    Status  On-going      OT SHORT TERM GOAL #4   Title  Pt will increase A/ROM to WNL to improve ability to reach into overhead cabinets.    Time  4    Period  Weeks    Status  On-going      OT SHORT TERM GOAL #5   Title  Pt will improved RUE strength to 4+/5 or greater to improve ability to perform lightweight lifting tasks.    Time  4    Period  Weeks    Status  On-going               Plan - 02/17/19 1259    Clinical Impression Statement  A: Continued with manual therapy to trapezius and scapular regions. Pt with full ROM today in all planes, continued with A/ROM and UBE, added scapular theraband today. Pt reports soreness after completing tasks such as packing boxes or using the arm a lot. Verbal cuing for form and technique.    Body Structure / Function / Physical Skills  ADL;Endurance;UE functional use;Fascial restriction;Pain;ROM;IADL;Strength    Plan  P: Update HEP for scapular theraband, resume ball on wall, follow up on tennis ball massage       Patient will benefit from skilled therapeutic intervention in order to improve the following deficits and impairments:   Body Structure / Function / Physical Skills: ADL, Endurance, UE functional use, Fascial restriction, Pain, ROM, IADL, Strength       Visit Diagnosis: Acute pain of right shoulder  Other symptoms and signs involving the musculoskeletal system    Problem List Patient Active Problem List   Diagnosis Date Noted  . Upper respiratory tract infection 05/30/2018  . Viral illness 05/30/2018  . Dyspepsia   . Epigastric pain 09/18/2015  . FH: colon cancer 09/18/2015  . Osteoporosis 05/20/2015  . Moderate single current episode of major depressive disorder ([place]) 02/18/2015  . S/P right UKR 07/02/2014  . Status post unilateral knee replacement 07/02/2014  . Asthma, mild intermittent 02/06/2014  . Lung  nodule 02/06/2014  . Hyperlipidemia 01/18/2014  . Expected blood loss anemia 05/10/2013  . Overweight (BMI 25.0-29.9) 05/10/2013  . S/P right THA, AA 05/09/2013  . Pre-operative respiratory examination 05/07/2013  . Post-nasal drainage 05/07/2013  . Endometrial polyp 02/28/2013  . Osteoarthritis of right hip 02/15/2013  . Precordial pain 12/07/2012  . Chronic back pain 08/17/2012  . Hypothyroid 08/17/2012  . Osteopenia 08/17/2012  . Hypersomnia 11/18/2011  . Asthma, mild persistent 08/05/2011  . Memory change 08/05/2011  . TIA (transient ischemic attack) 08/05/2011  . Pulmonary nodule 08/05/2011  . SOB (shortness of breath) 01/05/2011   Guadelupe Sabin, OTR/L  [phone] 02/17/2019, 1:02 PM  [facility] [address] [place], [place], [address] Phone: [phone]   Fax:  [phone]  Name: Jenna Contreras MRN: [id number] Date of Birth: 06/06/1945

## 2019-02-21 ENCOUNTER — Encounter (HOSPITAL_COMMUNITY): Payer: Self-pay | Admitting: Occupational Therapy

## 2019-02-21 ENCOUNTER — Ambulatory Visit (HOSPITAL_COMMUNITY): Payer: Medicare Other | Admitting: Occupational Therapy

## 2019-02-21 ENCOUNTER — Other Ambulatory Visit: Payer: Self-pay

## 2019-02-21 DIAGNOSIS — M25511 Pain in right shoulder: Secondary | ICD-10-CM | POA: Diagnosis not present

## 2019-02-21 DIAGNOSIS — R29898 Other symptoms and signs involving the musculoskeletal system: Secondary | ICD-10-CM

## 2019-02-21 NOTE — Therapy (Signed)
[place], [place], [address] Phone: [phone]   Fax:  [phone]  Occupational Therapy Treatment  Patient Details  Name: Jenna Contreras MRN: [id number] Date of Birth: 01/08/1946 Referring Provider (OT): Dr. Netta Cedars   Encounter Date: 02/21/2019  OT End of Session - 02/21/19 1116    Visit Number  7    Number of Visits  8    Date for OT Re-Evaluation  02/23/19    Authorization Type  1) Medicare A & B  2) [employer]    Authorization Time Period  progress note at visit 10    Authorization - Visit Number  7    Authorization - Number of Visits  10    OT Start Time  1030    OT Stop Time  1113    OT Time Calculation (min)  43 min    Activity Tolerance  Patient tolerated treatment well    Behavior During Therapy  [facility] for tasks assessed/performed       Past Medical History:  Diagnosis Date  . Anxiety   . Arthritis   . Arthritis   . Asthma   . Bipolar 2 disorder ([place])   . Cancer (HCC)    Basal Cell  . Constipation   . Depression   . Fibromyalgia   . GERD (gastroesophageal reflux disease)   . Heart murmur    As small child   . History of gout   . History of skin cancer   . HNP (herniated nucleus pulposus with myelopathy), thoracic   . Hypothyroidism   . Left eye injury    In ED 06/17/2014   . Low BP   . Migraines   . OCD (obsessive compulsive disorder)   . Osteoporosis   . Psoriasis   . Scoliosis     Past Surgical History:  Procedure Laterality Date  . Arthroscopic left knee surgery    . BIOPSY  10/28/2017   Procedure: BIOPSY;  Surgeon: Danie Binder, MD;  [facility];  Service: Endoscopy;;  duodenal biopsy , gastric biopsy   . DILATION AND CURETTAGE OF UTERUS  x3  . ESOPHAGOGASTRODUODENOSCOPY (EGD) WITH PROPOFOL N/A 10/28/2017   Procedure: ESOPHAGOGASTRODUODENOSCOPY (EGD) WITH PROPOFOL;  Surgeon: Danie Binder, MD;  [facility];  Service: Endoscopy;   Laterality: N/A;  8:15am  . HIP SURGERY Right 2015  . iliac wings    . KNEE SURGERY Bilateral 2015, 2016  . MANDIBLE SURGERY     X 2  . PARTIAL KNEE ARTHROPLASTY Left 02/12/2014   Procedure: LEFT KNEE UNICOMPARTMENTAL MEDIALLY ARTHROPLASTY ;  Surgeon: Mauri Pole, MD;  [facility];  Service: Orthopedics;  Laterality: Left;  . PARTIAL KNEE ARTHROPLASTY Right 07/02/2014   Procedure: RIGHT UNI KNEE ARTHOPLASTY MEDIALLY;  Surgeon: Paralee Cancel, MD;  Location: [facility];  Service: Orthopedics;  Laterality: Right;  . rod in spine     scoliosis   . TONSILLECTOMY    . TOTAL HIP ARTHROPLASTY Right 05/09/2013   Procedure: RIGHT TOTAL HIP ARTHROPLASTY ANTERIOR APPROACH;  Surgeon: Mauri Pole, MD;  [facility]: [facility];  Service: Orthopedics;  Laterality: Right;  . TUBAL LIGATION    . WRIST SURGERY      There were no vitals filed for this visit.  Subjective Assessment - 02/21/19 1029    Subjective   S: I was in a lot of pain yesterday.    Currently in Pain?  Yes  Pain Score  8     Pain Location  Shoulder    Pain Orientation  Right    Pain Descriptors / Indicators  Aching;Sore    Pain Type  Chronic pain    Pain Radiating Towards  N/A    Pain Onset  1 to 4 weeks ago    Pain Frequency  Intermittent    Aggravating Factors   movement, lifting    Pain Relieving Factors  rest, heat    Effect of Pain on Daily Activities  mod effect on ADLs, pt pushes through    Multiple Pain Sites  No         [facility] OT Assessment - 02/21/19 1029      Assessment   Medical Diagnosis  right shoulder RC tears      Precautions   Precautions  None               OT Treatments/Exercises (OP) - 02/21/19 1035      Exercises   Exercises  Shoulder      Shoulder Exercises: Supine   Protraction  PROM;5 reps;AROM;12 reps    Horizontal ABduction  PROM;5 reps;AROM;12 reps    External Rotation  PROM;5 reps;AROM;12 reps    Internal Rotation  PROM;5 reps;AROM;12 reps    Flexion  PROM;5 reps;AROM;12 reps     ABduction  PROM;5 reps;AROM;12 reps      Shoulder Exercises: Standing   Protraction  AROM;12 reps    Horizontal ABduction  AROM;12 reps    External Rotation  AROM;12 reps    Internal Rotation  AROM;12 reps    Flexion  AROM;12 reps    ABduction  AROM;12 reps    Extension  Theraband;10 reps    Theraband Level (Shoulder Extension)  Level 2 (Red)    Row  Theraband;10 reps    Theraband Level (Shoulder Row)  Level 2 (Red)    Retraction  Theraband;10 reps    Theraband Level (Shoulder Retraction)  Level 2 (Red)      Shoulder Exercises: ROM/Strengthening   Proximal Shoulder Strengthening, Supine  12X each no rest breaks    Proximal Shoulder Strengthening, Seated  12X each no rest breaks    Ball on Wall  1' flexion 1' abduction      Manual Therapy   Manual Therapy  Myofascial release    Manual therapy comments  completed separately from therapeutic exercises     Myofascial Release  myofascial release to right upper arm, trapezius, and scapular regions to decrease pain and fascial restrictions and increase joint ROM             OT Education - 02/21/19 1058    Education Details  scapular theraband-red band    Person(s) Educated  Patient    Methods  Explanation;Demonstration;Handout    Comprehension  Verbalized understanding;Returned demonstration       OT Short Term Goals - 01/27/19 1011      OT SHORT TERM GOAL #1   Title  Pt will be provided with and educated on HEP to improve mobility in RUE required for ADL completion.    Time  4    Period  Weeks    Status  On-going    Target Date  02/23/19      OT SHORT TERM GOAL #2   Title  Pt will decrease RUE fascial restrictions to trace amounts to improve mobility required for functional reaching tasks.    Time  4    Period  Weeks  Status  On-going      OT SHORT TERM GOAL #3   Title  Pt will decrease pain in RUE to 3/10 or less to increase ability to perform dressing and bathing tasks.    Time  4    Period  Weeks     Status  On-going      OT SHORT TERM GOAL #4   Title  Pt will increase A/ROM to WNL to improve ability to reach into overhead cabinets.    Time  4    Period  Weeks    Status  On-going      OT SHORT TERM GOAL #5   Title  Pt will improved RUE strength to 4+/5 or greater to improve ability to perform lightweight lifting tasks.    Time  4    Period  Weeks    Status  On-going               Plan - 02/21/19 1100    Clinical Impression Statement  A: Pt reports pain with tennis ball at home, thinks it was because ball was too close to her spine. Continued with manual therapy to ac joint region to decrease pain. Continued with A/ROM supine and standing, pt with pain at end ROM. Continued with scapular theraband and updated for HEP. Verbal cuing for form and technique.    Body Structure / Function / Physical Skills  ADL;Endurance;UE functional use;Fascial restriction;Pain;ROM;IADL;Strength    Plan  P: Reassessment, FOTO, decide if ready fot d/c with HEP       Patient will benefit from skilled therapeutic intervention in order to improve the following deficits and impairments:   Body Structure / Function / Physical Skills: ADL, Endurance, UE functional use, Fascial restriction, Pain, ROM, IADL, Strength       Visit Diagnosis: Acute pain of right shoulder  Other symptoms and signs involving the musculoskeletal system    Problem List Patient Active Problem List   Diagnosis Date Noted  . Upper respiratory tract infection 05/30/2018  . Viral illness 05/30/2018  . Dyspepsia   . Epigastric pain 09/18/2015  . FH: colon cancer 09/18/2015  . Osteoporosis 05/20/2015  . Moderate single current episode of major depressive disorder ([place]) 02/18/2015  . S/P right UKR 07/02/2014  . Status post unilateral knee replacement 07/02/2014  . Asthma, mild intermittent 02/06/2014  . Lung nodule 02/06/2014  . Hyperlipidemia 01/18/2014  . Expected blood loss anemia 05/10/2013  . Overweight (BMI  25.0-29.9) 05/10/2013  . S/P right THA, AA 05/09/2013  . Pre-operative respiratory examination 05/07/2013  . Post-nasal drainage 05/07/2013  . Endometrial polyp 02/28/2013  . Osteoarthritis of right hip 02/15/2013  . Precordial pain 12/07/2012  . Chronic back pain 08/17/2012  . Hypothyroid 08/17/2012  . Osteopenia 08/17/2012  . Hypersomnia 11/18/2011  . Asthma, mild persistent 08/05/2011  . Memory change 08/05/2011  . TIA (transient ischemic attack) 08/05/2011  . Pulmonary nodule 08/05/2011  . SOB (shortness of breath) 01/05/2011   Guadelupe Sabin, OTR/L  [phone] 02/21/2019, 11:16 AM  [place], [place], [address] Phone: [phone]   Fax:  [phone]  Name: Jenna Contreras MRN: [id number] Date of Birth: Nov 05, 1945

## 2019-02-21 NOTE — Patient Instructions (Signed)

## 2019-02-23 ENCOUNTER — Encounter (HOSPITAL_COMMUNITY): Payer: Medicare Other | Admitting: Occupational Therapy

## 2019-02-24 ENCOUNTER — Telehealth (HOSPITAL_COMMUNITY): Payer: Self-pay | Admitting: Specialist

## 2019-02-24 ENCOUNTER — Ambulatory Visit (HOSPITAL_COMMUNITY): Payer: Medicare Other | Admitting: Specialist

## 2019-02-24 NOTE — Telephone Encounter (Signed)
pt called to cancel today's appt due to she has a migraine headache.

## 2019-02-27 ENCOUNTER — Other Ambulatory Visit: Payer: Self-pay

## 2019-02-27 ENCOUNTER — Encounter (HOSPITAL_COMMUNITY): Payer: Self-pay | Admitting: Occupational Therapy

## 2019-02-27 ENCOUNTER — Ambulatory Visit (HOSPITAL_COMMUNITY): Payer: Medicare Other | Admitting: Occupational Therapy

## 2019-02-27 DIAGNOSIS — M25511 Pain in right shoulder: Secondary | ICD-10-CM

## 2019-02-27 DIAGNOSIS — R29898 Other symptoms and signs involving the musculoskeletal system: Secondary | ICD-10-CM

## 2019-02-27 NOTE — Therapy (Signed)
[place], [place], [address] Phone: [phone]   Fax:  [phone]  Occupational Therapy Treatment  Patient Details  Name: Jenna Contreras MRN: [id number] Date of Birth: 07/20/1945 Referring Provider (OT): Dr. Netta Cedars   Encounter Date: 02/27/2019  OT End of Session - 02/27/19 1202    Visit Number  8    Number of Visits  16    Date for OT Re-Evaluation  03/29/19    Authorization Type  1) Medicare A & B  2) [employer] for [employer]    Authorization Time Period  progress note at visit 18    Authorization - Visit Number  8    Authorization - Number of Visits  18    OT Start Time  1130    OT Stop Time  1202    OT Time Calculation (min)  32 min    Activity Tolerance  Patient tolerated treatment well    Behavior During Therapy  [facility] for tasks assessed/performed       Past Medical History:  Diagnosis Date  . Anxiety   . Arthritis   . Arthritis   . Asthma   . Bipolar 2 disorder ([place])   . Cancer (HCC)    Basal Cell  . Constipation   . Depression   . Fibromyalgia   . GERD (gastroesophageal reflux disease)   . Heart murmur    As small child   . History of gout   . History of skin cancer   . HNP (herniated nucleus pulposus with myelopathy), thoracic   . Hypothyroidism   . Left eye injury    In ED 06/17/2014   . Low BP   . Migraines   . OCD (obsessive compulsive disorder)   . Osteoporosis   . Psoriasis   . Scoliosis     Past Surgical History:  Procedure Laterality Date  . Arthroscopic left knee surgery    . BIOPSY  10/28/2017   Procedure: BIOPSY;  Surgeon: Danie Binder, MD;  [facility];  Service: Endoscopy;;  duodenal biopsy , gastric biopsy   . DILATION AND CURETTAGE OF UTERUS  x3  . ESOPHAGOGASTRODUODENOSCOPY (EGD) WITH PROPOFOL N/A 10/28/2017   Procedure: ESOPHAGOGASTRODUODENOSCOPY (EGD) WITH PROPOFOL;  Surgeon: Danie Binder, MD;  [facility];  Service: Endoscopy;   Laterality: N/A;  8:15am  . HIP SURGERY Right 2015  . iliac wings    . KNEE SURGERY Bilateral 2015, 2016  . MANDIBLE SURGERY     X 2  . PARTIAL KNEE ARTHROPLASTY Left 02/12/2014   Procedure: LEFT KNEE UNICOMPARTMENTAL MEDIALLY ARTHROPLASTY ;  Surgeon: Mauri Pole, MD;  [facility];  Service: Orthopedics;  Laterality: Left;  . PARTIAL KNEE ARTHROPLASTY Right 07/02/2014   Procedure: RIGHT UNI KNEE ARTHOPLASTY MEDIALLY;  Surgeon: Paralee Cancel, MD;  [facility];  Service: Orthopedics;  Laterality: Right;  . rod in spine     scoliosis   . TONSILLECTOMY    . TOTAL HIP ARTHROPLASTY Right 05/09/2013   Procedure: RIGHT TOTAL HIP ARTHROPLASTY ANTERIOR APPROACH;  Surgeon: Mauri Pole, MD;  [facility];  Service: Orthopedics;  Laterality: Right;  . TUBAL LIGATION    . WRIST SURGERY      There were no vitals filed for this visit.  Subjective Assessment - 02/27/19 1130    Subjective   S: It was feeling pretty good on Saturday and is feeling ok today.    Currently in  Pain?  Yes    Pain Score  2     Pain Location  Shoulder    Pain Orientation  Right    Pain Descriptors / Indicators  Aching;Sore    Pain Type  Chronic pain    Pain Radiating Towards  n/a    Pain Onset  More than a month ago    Pain Frequency  Intermittent    Aggravating Factors   movement, lifting    Pain Relieving Factors  rest, heat    Effect of Pain on Daily Activities  min effect on ADLs, pt pushes through    Multiple Pain Sites  No         [facility] OT Assessment - 02/27/19 1130      Assessment   Medical Diagnosis  right shoulder RC tears      Precautions   Precautions  None      Observation/Other Assessments   Focus on Therapeutic Outcomes (FOTO)   56/100   52/100 previous     Palpation   Palpation comment  min/mod fascial restrictions in trapezius, and scapular regions      AROM   Overall AROM Comments  Assessed seated, er/IR adducted    AROM Assessment Site  Shoulder    Right/Left Shoulder   Right    Right Shoulder Flexion  146 Degrees   143 previous   Right Shoulder ABduction  145 Degrees   117 previous   Right Shoulder Internal Rotation  90 Degrees   same as previous   Right Shoulder External Rotation  71 Degrees   same as previous     PROM   Overall PROM Comments  Assessed supine, er/IR adducted    PROM Assessment Site  Shoulder    Right/Left Shoulder  Right    Right Shoulder Flexion  155 Degrees   150 previous   Right Shoulder ABduction  144 Degrees   130 previous   Right Shoulder Internal Rotation  90 Degrees   same as previous   Right Shoulder External Rotation  70 Degrees   same as previous     Strength   Overall Strength Comments  Assessed seated, er/IR adducted    Strength Assessment Site  Shoulder    Right/Left Shoulder  Right    Right Shoulder Flexion  4+/5   same as previous   Right Shoulder ABduction  4+/5   4/5 previous   Right Shoulder Internal Rotation  5/5   4+/5 previous   Right Shoulder External Rotation  4+/5   same as previous              OT Treatments/Exercises (OP) - 02/27/19 1138      Exercises   Exercises  Shoulder      Shoulder Exercises: Supine   Protraction  PROM;5 reps;AROM;12 reps    Horizontal ABduction  PROM;5 reps;AROM;12 reps    External Rotation  PROM;5 reps;AROM;12 reps    Internal Rotation  PROM;5 reps;AROM;12 reps    Flexion  PROM;5 reps;AROM;12 reps    ABduction  PROM;5 reps;AROM;12 reps      Shoulder Exercises: Standing   Protraction  AROM;12 reps    Horizontal ABduction  AROM;12 reps    External Rotation  AROM;12 reps    Internal Rotation  AROM;12 reps    Flexion  AROM;12 reps    ABduction  AROM;12 reps               OT Short Term Goals - 02/27/19 1204  OT SHORT TERM GOAL #1   Title  Pt will be provided with and educated on HEP to improve mobility in RUE required for ADL completion.    Time  4    Period  Weeks    Status  On-going    Target Date  02/23/19      OT SHORT TERM  GOAL #2   Title  Pt will decrease RUE fascial restrictions to trace amounts to improve mobility required for functional reaching tasks.    Time  4    Period  Weeks    Status  On-going      OT SHORT TERM GOAL #3   Title  Pt will decrease pain in RUE to 3/10 or less to increase ability to perform dressing and bathing tasks.    Time  4    Period  Weeks    Status  On-going      OT SHORT TERM GOAL #4   Title  Pt will increase A/ROM to WNL to improve ability to reach into overhead cabinets.    Time  4    Period  Weeks    Status  Partially Met      OT SHORT TERM GOAL #5   Title  Pt will improved RUE strength to 4+/5 or greater to improve ability to perform lightweight lifting tasks.    Time  4    Period  Weeks    Status  Achieved               Plan - 02/27/19 1205    Clinical Impression Statement  A: Reassessment completed this session, pt has met 1 goal and partially met an additional goal. Pt has made improvements in ROM, strength, and functional use of RUE, continues to be limited by pain during ADLs and reaching/lifting tasks. Pt reports she is able to reach items at shoulder height now, however cannot complete lifting tasks and dressing/bathing continues to be difficult. Discussed effect of torn RC muscles and educated on expected pain and strength limitations, pt verbalized understanding. Pt completing A/ROM this session, no manual completed due to pt arriving late to session. Verbal cuing for form and technique.    OT Occupational Profile and History  Problem Focused Assessment - Including review of records relating to presenting problem    Occupational performance deficits (Please refer to evaluation for details):  ADL's;IADL's;Rest and Sleep;Leisure    Body Structure / Function / Physical Skills  ADL;Endurance;UE functional use;Fascial restriction;Pain;ROM;IADL;Strength    Rehab Potential  Good    Clinical Decision Making  Limited treatment options, no task modification  necessary    Comorbidities Affecting Occupational Performance:  None    Modification or Assistance to Complete Evaluation   No modification of tasks or assist necessary to complete eval    OT Frequency  2x / week    OT Duration  4 weeks    OT Treatment/Interventions  Self-care/ADL training;Ultrasound;Patient/family education;Passive range of motion;Cryotherapy;Electrical Stimulation;Moist Heat;Therapeutic exercise;Manual Therapy;Therapeutic activities    Plan  P: Continued skilled OT services for 4 additional weeks focusing on pain management and strength/activity tolerance required for functional task completion. Next session: add x to v arms       Patient will benefit from skilled therapeutic intervention in order to improve the following deficits and impairments:   Body Structure / Function / Physical Skills: ADL, Endurance, UE functional use, Fascial restriction, Pain, ROM, IADL, Strength       Visit Diagnosis: Acute pain of right shoulder  Other symptoms and signs involving the musculoskeletal system    Problem List Patient Active Problem List   Diagnosis Date Noted  . Upper respiratory tract infection 05/30/2018  . Viral illness 05/30/2018  . Dyspepsia   . Epigastric pain 09/18/2015  . FH: colon cancer 09/18/2015  . Osteoporosis 05/20/2015  . Moderate single current episode of major depressive disorder ([place]) 02/18/2015  . S/P right UKR 07/02/2014  . Status post unilateral knee replacement 07/02/2014  . Asthma, mild intermittent 02/06/2014  . Lung nodule 02/06/2014  . Hyperlipidemia 01/18/2014  . Expected blood loss anemia 05/10/2013  . Overweight (BMI 25.0-29.9) 05/10/2013  . S/P right THA, AA 05/09/2013  . Pre-operative respiratory examination 05/07/2013  . Post-nasal drainage 05/07/2013  . Endometrial polyp 02/28/2013  . Osteoarthritis of right hip 02/15/2013  . Precordial pain 12/07/2012  . Chronic back pain 08/17/2012  . Hypothyroid 08/17/2012  . Osteopenia  08/17/2012  . Hypersomnia 11/18/2011  . Asthma, mild persistent 08/05/2011  . Memory change 08/05/2011  . TIA (transient ischemic attack) 08/05/2011  . Pulmonary nodule 08/05/2011  . SOB (shortness of breath) 01/05/2011   Guadelupe Sabin, OTR/L  [phone] 02/27/2019, 12:50 PM  [place] [address] [place], [place], [address] Phone: [phone]   Fax:  [phone]  Name: Ivionna Verley MRN: [id number] Date of Birth: 1945/09/20

## 2019-02-27 NOTE — Addendum Note (Signed)
Addended by: Guadelupe Sabin A on: 02/27/2019 04:27 PM   Modules accepted: Orders

## 2019-03-01 ENCOUNTER — Ambulatory Visit (HOSPITAL_COMMUNITY): Payer: Medicare Other | Admitting: Occupational Therapy

## 2019-03-01 ENCOUNTER — Telehealth (HOSPITAL_COMMUNITY): Payer: Self-pay | Admitting: Occupational Therapy

## 2019-03-01 NOTE — Telephone Encounter (Signed)
Pt had to cx due to husband is being comative and she can not get to the car to come to her apptment, she was sorry to call so late.

## 2019-03-06 ENCOUNTER — Ambulatory Visit (HOSPITAL_COMMUNITY): Payer: Medicare Other | Admitting: Occupational Therapy

## 2019-03-06 ENCOUNTER — Other Ambulatory Visit: Payer: Self-pay

## 2019-03-06 ENCOUNTER — Encounter (HOSPITAL_COMMUNITY): Payer: Self-pay | Admitting: Occupational Therapy

## 2019-03-06 DIAGNOSIS — R29898 Other symptoms and signs involving the musculoskeletal system: Secondary | ICD-10-CM

## 2019-03-06 DIAGNOSIS — M25511 Pain in right shoulder: Secondary | ICD-10-CM | POA: Diagnosis not present

## 2019-03-06 NOTE — Therapy (Signed)
[place], [place], [address] Phone: [phone]   Fax:  [phone]  Occupational Therapy Treatment  Patient Details  Name: Jenna Contreras MRN: [id number] Date of Birth: September 29, 1945 Referring Provider (OT): Dr. Netta Cedars   Encounter Date: 03/06/2019  OT End of Session - 03/06/19 1726    Visit Number  9    Number of Visits  16    Date for OT Re-Evaluation  03/29/19    Authorization Type  1) Medicare A & B  2) [employer] for [employer]    Authorization Time Period  progress note at visit 18    Authorization - Visit Number  9    Authorization - Number of Visits  18    OT Start Time  2993   pt arrived late   OT Stop Time  1730    OT Time Calculation (min)  29 min    Activity Tolerance  Patient tolerated treatment well    Behavior During Therapy  [facility] for tasks assessed/performed       Past Medical History:  Diagnosis Date  . Anxiety   . Arthritis   . Arthritis   . Asthma   . Bipolar 2 disorder ([place])   . Cancer (HCC)    Basal Cell  . Constipation   . Depression   . Fibromyalgia   . GERD (gastroesophageal reflux disease)   . Heart murmur    As small child   . History of gout   . History of skin cancer   . HNP (herniated nucleus pulposus with myelopathy), thoracic   . Hypothyroidism   . Left eye injury    In ED 06/17/2014   . Low BP   . Migraines   . OCD (obsessive compulsive disorder)   . Osteoporosis   . Psoriasis   . Scoliosis     Past Surgical History:  Procedure Laterality Date  . Arthroscopic left knee surgery    . BIOPSY  10/28/2017   Procedure: BIOPSY;  Surgeon: Danie Binder, MD;  [facility];  Service: Endoscopy;;  duodenal biopsy , gastric biopsy   . DILATION AND CURETTAGE OF UTERUS  x3  . ESOPHAGOGASTRODUODENOSCOPY (EGD) WITH PROPOFOL N/A 10/28/2017   Procedure: ESOPHAGOGASTRODUODENOSCOPY (EGD) WITH PROPOFOL;  Surgeon: Danie Binder, MD;  [facility];   Service: Endoscopy;  Laterality: N/A;  8:15am  . HIP SURGERY Right 2015  . iliac wings    . KNEE SURGERY Bilateral 2015, 2016  . MANDIBLE SURGERY     X 2  . PARTIAL KNEE ARTHROPLASTY Left 02/12/2014   Procedure: LEFT KNEE UNICOMPARTMENTAL MEDIALLY ARTHROPLASTY ;  Surgeon: Mauri Pole, MD;  [facility];  Service: Orthopedics;  Laterality: Left;  . PARTIAL KNEE ARTHROPLASTY Right 07/02/2014   Procedure: RIGHT UNI KNEE ARTHOPLASTY MEDIALLY;  Surgeon: Paralee Cancel, MD;  [facility];  Service: Orthopedics;  Laterality: Right;  . rod in spine     scoliosis   . TONSILLECTOMY    . TOTAL HIP ARTHROPLASTY Right 05/09/2013   Procedure: RIGHT TOTAL HIP ARTHROPLASTY ANTERIOR APPROACH;  Surgeon: Mauri Pole, MD;  [facility];  Service: Orthopedics;  Laterality: Right;  . TUBAL LIGATION    . WRIST SURGERY      There were no vitals filed for this visit.  Subjective Assessment - 03/06/19 1702    Subjective   S: I had a terrible weekend.    Currently in Pain?  Yes  Pain Score  2     Pain Location  Shoulder    Pain Orientation  Right    Pain Descriptors / Indicators  Aching;Sore    Pain Type  Chronic pain    Pain Radiating Towards  N/A    Pain Onset  More than a month ago    Pain Frequency  Intermittent    Aggravating Factors   movement, lifting    Pain Relieving Factors  rest, heat    Effect of Pain on Daily Activities  min effect on ADLs    Multiple Pain Sites  No         [facility] OT Assessment - 03/06/19 1702      Assessment   Medical Diagnosis  right shoulder RC tears      Precautions   Precautions  None               OT Treatments/Exercises (OP) - 03/06/19 1702      Exercises   Exercises  Shoulder      Shoulder Exercises: Supine   Protraction  PROM;5 reps;AROM;15 reps    Horizontal ABduction  PROM;5 reps;AROM;15 reps    External Rotation  PROM;5 reps;AROM;15 reps    Internal Rotation  PROM;5 reps;AROM;15 reps    Flexion  PROM;5 reps;AROM;15 reps     ABduction  PROM;5 reps;AROM;15 reps      Shoulder Exercises: Standing   Protraction  AROM;12 reps    Horizontal ABduction  AROM;12 reps    External Rotation  AROM;12 reps    Internal Rotation  AROM;12 reps    Flexion  AROM;12 reps    ABduction  AROM;12 reps      Shoulder Exercises: Therapy Ball   Other Therapy Ball Exercises  green therapy ball: chest press, flexion, overhead press, circles each direction, 10X ech      Shoulder Exercises: ROM/Strengthening   X to V Arms  10X    Ball on Wall  1' flexion 1' abduction    Other ROM/Strengthening Exercises  Y lift off, 10X               OT Short Term Goals - 02/27/19 1204      OT SHORT TERM GOAL #1   Title  Pt will be provided with and educated on HEP to improve mobility in RUE required for ADL completion.    Time  4    Period  Weeks    Status  On-going    Target Date  02/23/19      OT SHORT TERM GOAL #2   Title  Pt will decrease RUE fascial restrictions to trace amounts to improve mobility required for functional reaching tasks.    Time  4    Period  Weeks    Status  On-going      OT SHORT TERM GOAL #3   Title  Pt will decrease pain in RUE to 3/10 or less to increase ability to perform dressing and bathing tasks.    Time  4    Period  Weeks    Status  On-going      OT SHORT TERM GOAL #4   Title  Pt will increase A/ROM to WNL to improve ability to reach into overhead cabinets.    Time  4    Period  Weeks    Status  Partially Met      OT SHORT TERM GOAL #5   Title  Pt will improved RUE strength to 4+/5 or greater to  improve ability to perform lightweight lifting tasks.    Time  4    Period  Weeks    Status  Achieved               Plan - 03/06/19 1706    Clinical Impression Statement  A: Pt reporting a low pain level today. No manual therapy completed as pt arrived late to session. Continued with passive stretching which pt was able to tolerate WFL in all ranges. Continued with A/ROM and x to v arms  today. Added green therapy ball exercises and completed ball on the wall. Verbal cuing for form and technique.    Body Structure / Function / Physical Skills  ADL;Endurance;UE functional use;Fascial restriction;Pain;ROM;IADL;Strength    Plan  P: Continue with green therapy ball exercises, manual therapy prn, functional reaching task       Patient will benefit from skilled therapeutic intervention in order to improve the following deficits and impairments:   Body Structure / Function / Physical Skills: ADL, Endurance, UE functional use, Fascial restriction, Pain, ROM, IADL, Strength       Visit Diagnosis: Acute pain of right shoulder  Other symptoms and signs involving the musculoskeletal system    Problem List Patient Active Problem List   Diagnosis Date Noted  . Upper respiratory tract infection 05/30/2018  . Viral illness 05/30/2018  . Dyspepsia   . Epigastric pain 09/18/2015  . FH: colon cancer 09/18/2015  . Osteoporosis 05/20/2015  . Moderate single current episode of major depressive disorder ([place]) 02/18/2015  . S/P right UKR 07/02/2014  . Status post unilateral knee replacement 07/02/2014  . Asthma, mild intermittent 02/06/2014  . Lung nodule 02/06/2014  . Hyperlipidemia 01/18/2014  . Expected blood loss anemia 05/10/2013  . Overweight (BMI 25.0-29.9) 05/10/2013  . S/P right THA, AA 05/09/2013  . Pre-operative respiratory examination 05/07/2013  . Post-nasal drainage 05/07/2013  . Endometrial polyp 02/28/2013  . Osteoarthritis of right hip 02/15/2013  . Precordial pain 12/07/2012  . Chronic back pain 08/17/2012  . Hypothyroid 08/17/2012  . Osteopenia 08/17/2012  . Hypersomnia 11/18/2011  . Asthma, mild persistent 08/05/2011  . Memory change 08/05/2011  . TIA (transient ischemic attack) 08/05/2011  . Pulmonary nodule 08/05/2011  . SOB (shortness of breath) 01/05/2011   Guadelupe Sabin, OTR/L  [phone] 03/06/2019, 5:32 PM  [facility] [address] [place], [place], [address] Phone: [phone]   Fax:  [phone]  Name: Jenna Contreras MRN: [id number] Date of Birth: 01-17-1946

## 2019-03-08 ENCOUNTER — Telehealth (HOSPITAL_COMMUNITY): Payer: Self-pay | Admitting: Occupational Therapy

## 2019-03-08 ENCOUNTER — Ambulatory Visit (HOSPITAL_COMMUNITY): Payer: Medicare Other | Admitting: Occupational Therapy

## 2019-03-08 NOTE — Telephone Encounter (Signed)
Pt was late and could not take a later time today

## 2019-03-15 ENCOUNTER — Ambulatory Visit (HOSPITAL_COMMUNITY): Payer: Medicare Other | Admitting: Occupational Therapy

## 2019-03-15 ENCOUNTER — Telehealth (HOSPITAL_COMMUNITY): Payer: Self-pay | Admitting: Occupational Therapy

## 2019-03-15 NOTE — Telephone Encounter (Signed)
pt cancelled because she was running late was not going to make it on time

## 2019-03-16 ENCOUNTER — Telehealth (HOSPITAL_COMMUNITY): Payer: Self-pay | Admitting: Occupational Therapy

## 2019-03-16 NOTE — Telephone Encounter (Signed)
She has to take her husband to the MD and had to cx hers.

## 2019-03-17 ENCOUNTER — Encounter (HOSPITAL_COMMUNITY): Payer: Self-pay | Admitting: Occupational Therapy

## 2019-03-22 ENCOUNTER — Other Ambulatory Visit: Payer: Self-pay

## 2019-03-22 ENCOUNTER — Encounter (INDEPENDENT_AMBULATORY_CARE_PROVIDER_SITE_OTHER): Payer: Self-pay

## 2019-03-22 ENCOUNTER — Encounter (HOSPITAL_COMMUNITY): Payer: Self-pay | Admitting: Occupational Therapy

## 2019-03-22 ENCOUNTER — Ambulatory Visit (HOSPITAL_COMMUNITY): Payer: Medicare Other | Attending: Orthopedic Surgery | Admitting: Occupational Therapy

## 2019-03-22 DIAGNOSIS — M25561 Pain in right knee: Secondary | ICD-10-CM | POA: Insufficient documentation

## 2019-03-22 DIAGNOSIS — M25511 Pain in right shoulder: Secondary | ICD-10-CM | POA: Insufficient documentation

## 2019-03-22 DIAGNOSIS — G8929 Other chronic pain: Secondary | ICD-10-CM | POA: Insufficient documentation

## 2019-03-22 DIAGNOSIS — R29898 Other symptoms and signs involving the musculoskeletal system: Secondary | ICD-10-CM | POA: Diagnosis present

## 2019-03-22 NOTE — Therapy (Signed)
[place], [place], [address] Phone: [phone]   Fax:  [phone]  Occupational Therapy Treatment  Patient Details  Name: Jenna Contreras MRN: [id number] Date of Birth: 05-23-1945 Referring Provider (OT): Dr. Netta Cedars   Encounter Date: 03/22/2019  OT End of Session - 03/22/19 1518    Visit Number  10    Number of Visits  16    Date for OT Re-Evaluation  03/29/19    Authorization Type  1) Medicare A & B  2) [employer] for [employer]    Authorization Time Period  progress note at visit 18    Authorization - Visit Number  10    Authorization - Number of Visits  18    OT Start Time  9244   pt arrived late   OT Stop Time  1510    OT Time Calculation (min)  33 min    Activity Tolerance  Patient tolerated treatment well    Behavior During Therapy  [facility] for tasks assessed/performed       Past Medical History:  Diagnosis Date  . Anxiety   . Arthritis   . Arthritis   . Asthma   . Bipolar 2 disorder ([place])   . Cancer (HCC)    Basal Cell  . Constipation   . Depression   . Fibromyalgia   . GERD (gastroesophageal reflux disease)   . Heart murmur    As small child   . History of gout   . History of skin cancer   . HNP (herniated nucleus pulposus with myelopathy), thoracic   . Hypothyroidism   . Left eye injury    In ED 06/17/2014   . Low BP   . Migraines   . OCD (obsessive compulsive disorder)   . Osteoporosis   . Psoriasis   . Scoliosis     Past Surgical History:  Procedure Laterality Date  . Arthroscopic left knee surgery    . BIOPSY  10/28/2017   Procedure: BIOPSY;  Surgeon: Danie Binder, MD;  [facility];  Service: Endoscopy;;  duodenal biopsy , gastric biopsy   . DILATION AND CURETTAGE OF UTERUS  x3  . ESOPHAGOGASTRODUODENOSCOPY (EGD) WITH PROPOFOL N/A 10/28/2017   Procedure: ESOPHAGOGASTRODUODENOSCOPY (EGD) WITH PROPOFOL;  Surgeon: Danie Binder, MD;  [facility];   Service: Endoscopy;  Laterality: N/A;  8:15am  . HIP SURGERY Right 2015  . iliac wings    . KNEE SURGERY Bilateral 2015, 2016  . MANDIBLE SURGERY     X 2  . PARTIAL KNEE ARTHROPLASTY Left 02/12/2014   Procedure: LEFT KNEE UNICOMPARTMENTAL MEDIALLY ARTHROPLASTY ;  Surgeon: Mauri Pole, MD;  [facility];  Service: Orthopedics;  Laterality: Left;  . PARTIAL KNEE ARTHROPLASTY Right 07/02/2014   Procedure: RIGHT UNI KNEE ARTHOPLASTY MEDIALLY;  Surgeon: Paralee Cancel, MD;  [facility];  Service: Orthopedics;  Laterality: Right;  . rod in spine     scoliosis   . TONSILLECTOMY    . TOTAL HIP ARTHROPLASTY Right 05/09/2013   Procedure: RIGHT TOTAL HIP ARTHROPLASTY ANTERIOR APPROACH;  Surgeon: Mauri Pole, MD;  [facility];  Service: Orthopedics;  Laterality: Right;  . TUBAL LIGATION    . WRIST SURGERY      There were no vitals filed for this visit.  Subjective Assessment - 03/22/19 1358    Subjective   S: It hasn't hurt in 5 days.    Currently in Pain?  No/denies         [facility] OT Assessment - 03/22/19 1358      Assessment   Medical Diagnosis  right shoulder RC tears      Precautions   Precautions  None               OT Treatments/Exercises (OP) - 03/22/19 1359      Exercises   Exercises  Shoulder      Shoulder Exercises: Supine   Protraction  PROM;5 reps;Strengthening;10 reps    Protraction Weight (lbs)  1    Horizontal ABduction  PROM;5 reps;Strengthening;10 reps    Horizontal ABduction Weight (lbs)  1    External Rotation  PROM;5 reps;Strengthening;10 reps    External Rotation Weight (lbs)  1    Internal Rotation  PROM;5 reps;Strengthening;10 reps    Internal Rotation Weight (lbs)  1    Flexion  PROM;5 reps;Strengthening;10 reps    Shoulder Flexion Weight (lbs)  1    ABduction  PROM;5 reps;Strengthening;10 reps    Shoulder ABduction Weight (lbs)  1      Shoulder Exercises: ROM/Strengthening   UBE (Upper Arm Bike)  Level 1 2' reverse    Over  Head Lace  1'    "W" Arms  10X    X to V Arms  15X    Proximal Shoulder Strengthening, Supine  10X each, 1# weight, 1 rest break    Ball on Wall  1' flexion 1' abduction    Other ROM/Strengthening Exercises  Y lift off, 10X    Other ROM/Strengthening Exercises  red loop band: lateral wall slides, wall slide with lift off, 10X each               OT Short Term Goals - 02/27/19 1204      OT SHORT TERM GOAL #1   Title  Pt will be provided with and educated on HEP to improve mobility in RUE required for ADL completion.    Time  4    Period  Weeks    Status  On-going    Target Date  02/23/19      OT SHORT TERM GOAL #2   Title  Pt will decrease RUE fascial restrictions to trace amounts to improve mobility required for functional reaching tasks.    Time  4    Period  Weeks    Status  On-going      OT SHORT TERM GOAL #3   Title  Pt will decrease pain in RUE to 3/10 or less to increase ability to perform dressing and bathing tasks.    Time  4    Period  Weeks    Status  On-going      OT SHORT TERM GOAL #4   Title  Pt will increase A/ROM to WNL to improve ability to reach into overhead cabinets.    Time  4    Period  Weeks    Status  Partially Met      OT SHORT TERM GOAL #5   Title  Pt will improved RUE strength to 4+/5 or greater to improve ability to perform lightweight lifting tasks.    Time  4    Period  Weeks    Status  Achieved               Plan - 03/22/19 1518    Clinical Impression Statement  A: Pt reporting no pain for past 5 days unless moving quickly-like putting a coat on. No  manual therapy completed today, progressed to supine strengthening using 1# hand weights. Added red loop band and overhead lacing today.Verbal cuing for form and technique, pt reporting mild soreness and fatigue at end of session.    Body Structure / Function / Physical Skills  ADL;Endurance;UE functional use;Fascial restriction;Pain;ROM;IADL;Strength    Plan  P: Follow up on  pain level after today's session, continue with strengthening if able to tolerate       Patient will benefit from skilled therapeutic intervention in order to improve the following deficits and impairments:   Body Structure / Function / Physical Skills: ADL, Endurance, UE functional use, Fascial restriction, Pain, ROM, IADL, Strength       Visit Diagnosis: Acute pain of right shoulder  Other symptoms and signs involving the musculoskeletal system  Chronic pain of right knee    Problem List Patient Active Problem List   Diagnosis Date Noted  . Upper respiratory tract infection 05/30/2018  . Viral illness 05/30/2018  . Dyspepsia   . Epigastric pain 09/18/2015  . FH: colon cancer 09/18/2015  . Osteoporosis 05/20/2015  . Moderate single current episode of major depressive disorder ([place]) 02/18/2015  . S/P right UKR 07/02/2014  . Status post unilateral knee replacement 07/02/2014  . Asthma, mild intermittent 02/06/2014  . Lung nodule 02/06/2014  . Hyperlipidemia 01/18/2014  . Expected blood loss anemia 05/10/2013  . Overweight (BMI 25.0-29.9) 05/10/2013  . S/P right THA, AA 05/09/2013  . Pre-operative respiratory examination 05/07/2013  . Post-nasal drainage 05/07/2013  . Endometrial polyp 02/28/2013  . Osteoarthritis of right hip 02/15/2013  . Precordial pain 12/07/2012  . Chronic back pain 08/17/2012  . Hypothyroid 08/17/2012  . Osteopenia 08/17/2012  . Hypersomnia 11/18/2011  . Asthma, mild persistent 08/05/2011  . Memory change 08/05/2011  . TIA (transient ischemic attack) 08/05/2011  . Pulmonary nodule 08/05/2011  . SOB (shortness of breath) 01/05/2011   Guadelupe Sabin, OTR/L  [phone] 03/22/2019, 3:22 PM  [facility] [address] [place], [place], [address] Phone: [phone]   Fax:  [phone]  Name: Sharonda Llamas MRN: [id number] Date of Birth: 10/17/1945

## 2019-03-24 ENCOUNTER — Encounter (HOSPITAL_COMMUNITY): Payer: Self-pay | Admitting: Occupational Therapy

## 2019-03-24 ENCOUNTER — Other Ambulatory Visit: Payer: Self-pay

## 2019-03-24 ENCOUNTER — Ambulatory Visit (HOSPITAL_COMMUNITY): Payer: Medicare Other | Admitting: Occupational Therapy

## 2019-03-24 DIAGNOSIS — M25511 Pain in right shoulder: Secondary | ICD-10-CM

## 2019-03-24 DIAGNOSIS — R29898 Other symptoms and signs involving the musculoskeletal system: Secondary | ICD-10-CM

## 2019-03-24 NOTE — Therapy (Signed)
[place], [place], [address] Phone: [phone]   Fax:  [phone]  Occupational Therapy Treatment  Patient Details  Name: Jenna Contreras MRN: [id number] Date of Birth: [birth year] Referring Provider (OT): Dr. Netta Cedars   Encounter Date: 03/24/2019  OT End of Session - 03/24/19 1027    Visit Number  11    Number of Visits  16    Date for OT Re-Evaluation  03/29/19    Authorization Type  1) Medicare A & B  2) [employer]    Authorization Time Period  progress note at visit 18    Authorization - Visit Number  11    Authorization - Number of Visits  18    OT Start Time  [phone]    OT Stop Time  1030    OT Time Calculation (min)  38 min    Activity Tolerance  Patient tolerated treatment well    Behavior During Therapy  [facility] for tasks assessed/performed       Past Medical History:  Diagnosis Date  . Anxiety   . Arthritis   . Arthritis   . Asthma   . Bipolar 2 disorder ([place])   . Cancer (HCC)    Basal Cell  . Constipation   . Depression   . Fibromyalgia   . GERD (gastroesophageal reflux disease)   . Heart murmur    As small child   . History of gout   . History of skin cancer   . HNP (herniated nucleus pulposus with myelopathy), thoracic   . Hypothyroidism   . Left eye injury    In ED 06/17/2014   . Low BP   . Migraines   . OCD (obsessive compulsive disorder)   . Osteoporosis   . Psoriasis   . Scoliosis     Past Surgical History:  Procedure Laterality Date  . Arthroscopic left knee surgery    . BIOPSY  10/28/2017   Procedure: BIOPSY;  Surgeon: Danie Binder, MD;  [facility];  Service: Endoscopy;;  duodenal biopsy , gastric biopsy   . DILATION AND CURETTAGE OF UTERUS  x3  . ESOPHAGOGASTRODUODENOSCOPY (EGD) WITH PROPOFOL N/A 10/28/2017   Procedure: ESOPHAGOGASTRODUODENOSCOPY (EGD) WITH PROPOFOL;  Surgeon: Danie Binder, MD;  [facility];  Service:  Endoscopy;  Laterality: N/A;  8:15am  . HIP SURGERY Right 2015  . iliac wings    . KNEE SURGERY Bilateral 2015, 2016  . MANDIBLE SURGERY     X 2  . PARTIAL KNEE ARTHROPLASTY Left 02/12/2014   Procedure: LEFT KNEE UNICOMPARTMENTAL MEDIALLY ARTHROPLASTY ;  Surgeon: Mauri Pole, MD;  [facility];  Service: Orthopedics;  Laterality: Left;  . PARTIAL KNEE ARTHROPLASTY Right 07/02/2014   Procedure: RIGHT UNI KNEE ARTHOPLASTY MEDIALLY;  Surgeon: Paralee Cancel, MD;  [facility];  Service: Orthopedics;  Laterality: Right;  . rod in spine     scoliosis   . TONSILLECTOMY    . TOTAL HIP ARTHROPLASTY Right 05/09/2013   Procedure: RIGHT TOTAL HIP ARTHROPLASTY ANTERIOR APPROACH;  Surgeon: Mauri Pole, MD;  [facility];  Service: Orthopedics;  Laterality: Right;  . TUBAL LIGATION    . WRIST SURGERY      There were no vitals filed for this visit.  Subjective Assessment - 03/24/19 0952    Subjective   S: I did not have to use a heating pad or pain medication yesterday.    Currently  in Pain?  No/denies         [facility] OT Assessment - 03/24/19 0952      Assessment   Medical Diagnosis  right shoulder RC tears      Precautions   Precautions  None               OT Treatments/Exercises (OP) - 03/24/19 0954      Exercises   Exercises  Shoulder      Shoulder Exercises: Supine   Protraction  PROM;5 reps;Strengthening;10 reps    Protraction Weight (lbs)  1    Horizontal ABduction  PROM;5 reps;Strengthening;10 reps    Horizontal ABduction Weight (lbs)  1    External Rotation  PROM;5 reps;Strengthening;10 reps    External Rotation Weight (lbs)  1    Internal Rotation  PROM;5 reps;Strengthening;10 reps    Internal Rotation Weight (lbs)  1    Flexion  PROM;5 reps;Strengthening;10 reps    Shoulder Flexion Weight (lbs)  1    ABduction  PROM;5 reps;Strengthening;10 reps    Shoulder ABduction Weight (lbs)  1      Shoulder Exercises: Therapy Ball   Other Therapy Ball  Exercises  green therapy ball: chest press, flexion, overhead press, circles each direction, 15X ech      Shoulder Exercises: ROM/Strengthening   "W" Arms  10X     X to V Arms  15X    Proximal Shoulder Strengthening, Supine  10X each, 1# weight    Ball on Wall  1' flexion 1' abduction    Other ROM/Strengthening Exercises  arms on fire, 4 positions, 15" each    Other ROM/Strengthening Exercises  red loop band: lateral wall slides, wall slide with lift off, diagonal wall slides (1, 3, 5 o'clock), 10X each               OT Short Term Goals - 02/27/19 1204      OT SHORT TERM GOAL #1   Title  Pt will be provided with and educated on HEP to improve mobility in RUE required for ADL completion.    Time  4    Period  Weeks    Status  On-going    Target Date  02/23/19      OT SHORT TERM GOAL #2   Title  Pt will decrease RUE fascial restrictions to trace amounts to improve mobility required for functional reaching tasks.    Time  4    Period  Weeks    Status  On-going      OT SHORT TERM GOAL #3   Title  Pt will decrease pain in RUE to 3/10 or less to increase ability to perform dressing and bathing tasks.    Time  4    Period  Weeks    Status  On-going      OT SHORT TERM GOAL #4   Title  Pt will increase A/ROM to WNL to improve ability to reach into overhead cabinets.    Time  4    Period  Weeks    Status  Partially Met      OT SHORT TERM GOAL #5   Title  Pt will improved RUE strength to 4+/5 or greater to improve ability to perform lightweight lifting tasks.    Time  4    Period  Weeks    Status  Achieved               Plan - 03/24/19 1017    Clinical  Impression Statement  A: Pt reports minimal soreness since last session. Continued with strengthening using 1# weights. Resumed therapy ball and increased repetitions to 15. Added diagonal wall slides with red loop band. Pt with no complaints of pain during session. Verbal cuing for form and technique.    Body  Structure / Function / Physical Skills  ADL;Endurance;UE functional use;Fascial restriction;Pain;ROM;IADL;Strength    Plan  P: Continue with strengthening and prepare pt for discharge with HEP next session.       Patient will benefit from skilled therapeutic intervention in order to improve the following deficits and impairments:   Body Structure / Function / Physical Skills: ADL, Endurance, UE functional use, Fascial restriction, Pain, ROM, IADL, Strength       Visit Diagnosis: Other symptoms and signs involving the musculoskeletal system  Acute pain of right shoulder    Problem List Patient Active Problem List   Diagnosis Date Noted  . Upper respiratory tract infection 05/30/2018  . Viral illness 05/30/2018  . Dyspepsia   . Epigastric pain 09/18/2015  . FH: colon cancer 09/18/2015  . Osteoporosis 05/20/2015  . Moderate single current episode of major depressive disorder ([place]) 02/18/2015  . S/P right UKR 07/02/2014  . Status post unilateral knee replacement 07/02/2014  . Asthma, mild intermittent 02/06/2014  . Lung nodule 02/06/2014  . Hyperlipidemia 01/18/2014  . Expected blood loss anemia 05/10/2013  . Overweight (BMI 25.0-29.9) 05/10/2013  . S/P right THA, AA 05/09/2013  . Pre-operative respiratory examination 05/07/2013  . Post-nasal drainage 05/07/2013  . Endometrial polyp 02/28/2013  . Osteoarthritis of right hip 02/15/2013  . Precordial pain 12/07/2012  . Chronic back pain 08/17/2012  . Hypothyroid 08/17/2012  . Osteopenia 08/17/2012  . Hypersomnia 11/18/2011  . Asthma, mild persistent 08/05/2011  . Memory change 08/05/2011  . TIA (transient ischemic attack) 08/05/2011  . Pulmonary nodule 08/05/2011  . SOB (shortness of breath) 01/05/2011   Guadelupe Sabin, OTR/L  [phone] 03/24/2019, 11:23 AM  [facility] [address] [place], [place], [address] Phone: [phone]   Fax:  [phone]  Name: Jenna Contreras MRN: [id number] Date of Birth: 05-30-1945

## 2019-03-27 ENCOUNTER — Telehealth (HOSPITAL_COMMUNITY): Payer: Self-pay | Admitting: Occupational Therapy

## 2019-03-27 NOTE — Telephone Encounter (Signed)
She called to cx her husband is not doing well and she has to take him to see his cardiologist tomorrow morning

## 2019-03-28 ENCOUNTER — Encounter (HOSPITAL_COMMUNITY): Payer: Self-pay | Admitting: Occupational Therapy

## 2019-03-30 ENCOUNTER — Encounter (HOSPITAL_COMMUNITY): Payer: Self-pay | Admitting: Occupational Therapy

## 2019-03-30 ENCOUNTER — Ambulatory Visit (HOSPITAL_COMMUNITY): Payer: Medicare Other | Admitting: Occupational Therapy

## 2019-03-30 ENCOUNTER — Other Ambulatory Visit: Payer: Self-pay

## 2019-03-30 DIAGNOSIS — R29898 Other symptoms and signs involving the musculoskeletal system: Secondary | ICD-10-CM

## 2019-03-30 DIAGNOSIS — G8929 Other chronic pain: Secondary | ICD-10-CM

## 2019-03-30 DIAGNOSIS — M25511 Pain in right shoulder: Secondary | ICD-10-CM | POA: Diagnosis not present

## 2019-03-30 DIAGNOSIS — M25561 Pain in right knee: Secondary | ICD-10-CM

## 2019-03-30 NOTE — Therapy (Signed)
[facility] [place], [place], [address] Phone: [phone]   Fax:  [phone]  Occupational Therapy Reassessment, Treatment, Discharge Summary   Patient Details  Name: Jenna Contreras MRN: [id number] Date of Birth: [birth year] Referring Provider (OT): Dr. Netta Cedars   Encounter Date: 03/30/2019  OT End of Session - 03/30/19 1157    Visit Number  12    Number of Visits  16    Date for OT Re-Evaluation  03/29/19    Authorization Type  1) Medicare A & B  2) [employer] for [employer]    Authorization Time Period  progress note at visit 18    Authorization - Visit Number  12    Authorization - Number of Visits  18    OT Start Time  1130   pt arrived late   OT Stop Time  1156    OT Time Calculation (min)  26 min    Activity Tolerance  Patient tolerated treatment well    Behavior During Therapy  [facility] for tasks assessed/performed       Past Medical History:  Diagnosis Date  . Anxiety   . Arthritis   . Arthritis   . Asthma   . Bipolar 2 disorder ([place])   . Cancer (HCC)    Basal Cell  . Constipation   . Depression   . Fibromyalgia   . GERD (gastroesophageal reflux disease)   . Heart murmur    As small child   . History of gout   . History of skin cancer   . HNP (herniated nucleus pulposus with myelopathy), thoracic   . Hypothyroidism   . Left eye injury    In ED 06/17/2014   . Low BP   . Migraines   . OCD (obsessive compulsive disorder)   . Osteoporosis   . Psoriasis   . Scoliosis     Past Surgical History:  Procedure Laterality Date  . Arthroscopic left knee surgery    . BIOPSY  10/28/2017   Procedure: BIOPSY;  Surgeon: Danie Binder, MD;  [facility];  Service: Endoscopy;;  duodenal biopsy , gastric biopsy   . DILATION AND CURETTAGE OF UTERUS  x3  . ESOPHAGOGASTRODUODENOSCOPY (EGD) WITH PROPOFOL N/A 10/28/2017   Procedure: ESOPHAGOGASTRODUODENOSCOPY (EGD) WITH PROPOFOL;  Surgeon: Danie Binder, MD;  Location: [facility];  Service: Endoscopy;  Laterality: N/A;  8:15am  . HIP SURGERY Right 2015  . iliac wings    . KNEE SURGERY Bilateral 2015, 2016  . MANDIBLE SURGERY     X 2  . PARTIAL KNEE ARTHROPLASTY Left 02/12/2014   Procedure: LEFT KNEE UNICOMPARTMENTAL MEDIALLY ARTHROPLASTY ;  Surgeon: Mauri Pole, MD;  Location: [facility];  Service: Orthopedics;  Laterality: Left;  . PARTIAL KNEE ARTHROPLASTY Right 07/02/2014   Procedure: RIGHT UNI KNEE ARTHOPLASTY MEDIALLY;  Surgeon: Paralee Cancel, MD;  Location: [facility];  Service: Orthopedics;  Laterality: Right;  . rod in spine     scoliosis   . TONSILLECTOMY    . TOTAL HIP ARTHROPLASTY Right 05/09/2013   Procedure: RIGHT TOTAL HIP ARTHROPLASTY ANTERIOR APPROACH;  Surgeon: Mauri Pole, MD;  Location: [facility];  Service: Orthopedics;  Laterality: Right;  . TUBAL LIGATION    . WRIST SURGERY      There were no vitals filed for this visit.  Subjective Assessment - 03/30/19 1128    Subjective   S: There's been pain for the last 3 days.  Currently in Pain?  Yes    Pain Score  4     Pain Location  Shoulder    Pain Orientation  Right    Pain Descriptors / Indicators  Aching;Sore    Pain Type  Chronic pain    Pain Radiating Towards  N/A    Pain Onset  More than a month ago    Pain Frequency  Intermittent    Aggravating Factors   movement, lifting    Pain Relieving Factors  rest, heat    Effect of Pain on Daily Activities  min effect on ADLs    Multiple Pain Sites  No         [facility] OT Assessment - 03/30/19 1127      Assessment   Medical Diagnosis  right shoulder RC tears      Precautions   Precautions  None      Palpation   Palpation comment  min fascial restrictions in trapezius, and scapular regions      AROM   Overall AROM Comments  Assessed seated, er/IR adducted    AROM Assessment Site  Shoulder    Right/Left Shoulder  Right    Right Shoulder Flexion  161 Degrees   146 previous   Right Shoulder ABduction  158  Degrees   145 previous   Right Shoulder Internal Rotation  90 Degrees   same as previous   Right Shoulder External Rotation  71 Degrees   same as previous     PROM   Overall PROM Comments  Assessed supine, er/IR adducted    PROM Assessment Site  Shoulder    Right/Left Shoulder  Right    Right Shoulder Flexion  170 Degrees   155 previous   Right Shoulder ABduction  155 Degrees   144 previous   Right Shoulder Internal Rotation  90 Degrees   same as previous   Right Shoulder External Rotation  70 Degrees   same as previous     Strength   Overall Strength Comments  Assessed seated, er/IR adducted    Strength Assessment Site  Shoulder    Right/Left Shoulder  Right    Right Shoulder Flexion  4+/5   same as previous   Right Shoulder ABduction  4+/5   same as previous   Right Shoulder Internal Rotation  5/5   same as previous   Right Shoulder External Rotation  5/5   4+/5 previous              OT Treatments/Exercises (OP) - 03/30/19 1131      ADLs   ADL Comments  Educated pt on pain management and causes for increased pain including stressful situation, increased use of her RUE, sleeping on it, etc. Pt acknowledges being up all night with her husband and experiencing significant stress the past 3 days.       Exercises   Exercises  Shoulder      Shoulder Exercises: Supine   Protraction  PROM;5 reps    Horizontal ABduction  PROM;5 reps    External Rotation  PROM;5 reps    Internal Rotation  PROM;5 reps    Flexion  PROM;5 reps    ABduction  PROM;5 reps      Shoulder Exercises: Therapy Ball   Other Therapy Ball Exercises  green therapy ball: chest press, flexion, overhead press, circles each direction, 10X ech             OT Education - 03/30/19 1152    Education  Details  therapy ball strengthening, red loop band exercises    Person(s) Educated  Patient    Methods  Explanation;Demonstration;Handout    Comprehension  Verbalized understanding;Returned  demonstration       OT Short Term Goals - 03/30/19 1140      OT SHORT TERM GOAL #1   Title  Pt will be provided with and educated on HEP to improve mobility in RUE required for ADL completion.    Time  4    Period  Weeks    Status  Achieved    Target Date  02/23/19      OT SHORT TERM GOAL #2   Title  Pt will decrease RUE fascial restrictions to trace amounts to improve mobility required for functional reaching tasks.    Time  4    Period  Weeks    Status  Partially Met      OT SHORT TERM GOAL #3   Title  Pt will decrease pain in RUE to 3/10 or less to increase ability to perform dressing and bathing tasks.    Time  4    Period  Weeks    Status  Partially Met      OT SHORT TERM GOAL #4   Title  Pt will increase A/ROM to WNL to improve ability to reach into overhead cabinets.    Time  4    Period  Weeks    Status  Achieved      OT SHORT TERM GOAL #5   Title  Pt will improved RUE strength to 4+/5 or greater to improve ability to perform lightweight lifting tasks.    Time  4    Period  Weeks    Status  Achieved               Plan - 03/30/19 1157    Clinical Impression Statement  A: Reassessment completed this session, pt has met 3/5 goals and has partially met remaining 2 goals. Pt reports last week she had no pain and now she has had pain for past 3 days, also endorses significant stress over past 3 days with her husband's health. Educated pt on expectation of pain with the RC tears and pain management strategies to use during times of increased stress and use of RUE. Pt educated on updated HEP for continued ROM and strength of RUE, pt reports improved use during ADLs. Pt is agreeable to discharge today.    Body Structure / Function / Physical Skills  ADL;Endurance;UE functional use;Fascial restriction;Pain;ROM;IADL;Strength    Plan  P: Discharge pt.       Patient will benefit from skilled therapeutic intervention in order to improve the following deficits and  impairments:   Body Structure / Function / Physical Skills: ADL, Endurance, UE functional use, Fascial restriction, Pain, ROM, IADL, Strength       Visit Diagnosis: Other symptoms and signs involving the musculoskeletal system  Chronic pain of right knee    Problem List Patient Active Problem List   Diagnosis Date Noted  . Upper respiratory tract infection 05/30/2018  . Viral illness 05/30/2018  . Dyspepsia   . Epigastric pain 09/18/2015  . FH: colon cancer 09/18/2015  . Osteoporosis 05/20/2015  . Moderate single current episode of major depressive disorder ([place]) 02/18/2015  . S/P right UKR 07/02/2014  . Status post unilateral knee replacement 07/02/2014  . Asthma, mild intermittent 02/06/2014  . Lung nodule 02/06/2014  . Hyperlipidemia 01/18/2014  . Expected blood loss anemia 05/10/2013  .  Overweight (BMI 25.0-29.9) 05/10/2013  . S/P right THA, AA 05/09/2013  . Pre-operative respiratory examination 05/07/2013  . Post-nasal drainage 05/07/2013  . Endometrial polyp 02/28/2013  . Osteoarthritis of right hip 02/15/2013  . Precordial pain 12/07/2012  . Chronic back pain 08/17/2012  . Hypothyroid 08/17/2012  . Osteopenia 08/17/2012  . Hypersomnia 11/18/2011  . Asthma, mild persistent 08/05/2011  . Memory change 08/05/2011  . TIA (transient ischemic attack) 08/05/2011  . Pulmonary nodule 08/05/2011  . SOB (shortness of breath) 01/05/2011   Guadelupe Sabin, OTR/L  [phone] 03/30/2019, 12:00 PM  [facility] [address] [place], [place], [address] Phone: [phone]   Fax:  [phone]  Name: Jenna Contreras MRN: [id number] Date of Birth: October 17, 1945   OCCUPATIONAL THERAPY DISCHARGE SUMMARY  Visits from Start of Care: 12  Current functional level related to goals / functional outcomes: See above. Pt has made great progress with ROM and strength, continues to have pain as expected with RC tearing. Pt is able to  use RUE for ADLs and when assisting with caregiving for her husband.    Remaining deficits: Occasional pain in RUE, decreased activity tolerance   Education / Equipment: HEP for continued strengthening Plan: Patient agrees to discharge.  Patient goals were met. Patient is being discharged due to meeting the stated rehab goals.  ?????

## 2019-03-30 NOTE — Patient Instructions (Addendum)
Therapy Ball Strengthening Exercises: Complete all exercises 10-15X each, 2-3x/week   1) Overhead press: Hold a medicine ball or other ball at your chest. Next, extend your elbows and push the ball over your head as shown. Return to starting position and repeat.     2) Chest press: Hold a medicine ball or other ball at your chest. Next, extend your elbows and push the ball outward in front of your body as shown. Return to starting position and repeat.     3) Ball circles:  Hold a medicine ball or other ball at your chest. Next, extend your elbows and push the ball outward in front of your body as shown. Then, move the ball in a circular pattern for several revolutions and then reverse the direction.     4) Flexion: Start holding an exercise ball out in front of your body with elbows extended. Then, slowly lift the ball up over head. Return the ball to starting position and repeat.    5) Lateral Wall Walks: With hands against wall, walk or slide your hands to the side against resistance of the band.   6) Upward/Diagonal Wall Walks: Walk or slide your hand up the wall in a diagonal direction, going against the resistance of the band.

## 2019-03-31 ENCOUNTER — Other Ambulatory Visit: Payer: Self-pay | Admitting: Family Medicine

## 2019-03-31 MED ORDER — LORAZEPAM 0.5 MG PO TABS: ORAL_TABLET | ORAL | 0 refills | Status: DC

## 2019-03-31 NOTE — Telephone Encounter (Signed)
Patient notified and med pended

## 2019-03-31 NOTE — Telephone Encounter (Signed)
Jenna Contreras wanted to know if Dr. Nicki Reaper would call in some [place] for her. Michela Pitcher she takes 2 a day.  She said she isn't seeing [facility] anymore.  Under a lot of stress right now with Glendell Docker.  Patient crying and upset.  [facility]

## 2019-03-31 NOTE — Telephone Encounter (Signed)
I would recommend lorazepam 0.5 mg 1 twice daily as needed use sparingly caution drowsiness #30 no refills I would encourage the patient not to exceed 2 in a day caution drowsiness should not take at the same time his pain medicine if it makes her feel drowsy she should not drive hopefully some days she can only take 1 finally she would need to do a virtual visit the week of February 1 to discuss this further  May pend the prescription then sent back

## 2019-04-25 ENCOUNTER — Telehealth: Payer: Self-pay | Admitting: Family Medicine

## 2019-04-25 ENCOUNTER — Other Ambulatory Visit: Payer: Self-pay | Admitting: Family Medicine

## 2019-04-25 MED ORDER — LORAZEPAM 0.5 MG PO TABS: ORAL_TABLET | ORAL | 1 refills | Status: DC

## 2019-04-25 NOTE — Telephone Encounter (Signed)
I had a good conversation with the patient Recently lost her husband Having a lot of problems with her emotions   We have agreed to increase Ativan 0.5 mg, #60, 1 twice daily with 2 refills I will send this in The patient needs to do a in person office visit in March please help set this up for her thank you

## 2019-05-11 ENCOUNTER — Other Ambulatory Visit: Payer: Self-pay | Admitting: Family Medicine

## 2019-05-11 NOTE — Telephone Encounter (Signed)
Last med check up august 2020

## 2019-05-24 ENCOUNTER — Other Ambulatory Visit: Payer: Self-pay

## 2019-05-24 ENCOUNTER — Encounter: Payer: Self-pay | Admitting: Family Medicine

## 2019-05-24 ENCOUNTER — Ambulatory Visit (INDEPENDENT_AMBULATORY_CARE_PROVIDER_SITE_OTHER): Payer: Medicare Other | Admitting: Family Medicine

## 2019-05-24 VITALS — Temp 96.2°F | Wt 129.8 lb

## 2019-05-24 DIAGNOSIS — E038 Other specified hypothyroidism: Secondary | ICD-10-CM

## 2019-05-24 DIAGNOSIS — Z79891 Long term (current) use of opiate analgesic: Secondary | ICD-10-CM | POA: Diagnosis not present

## 2019-05-24 DIAGNOSIS — F4321 Adjustment disorder with depressed mood: Secondary | ICD-10-CM

## 2019-05-24 DIAGNOSIS — F329 Major depressive disorder, single episode, unspecified: Secondary | ICD-10-CM

## 2019-05-24 DIAGNOSIS — F32A Depression, unspecified: Secondary | ICD-10-CM

## 2019-05-24 MED ORDER — HYDROCODONE-ACETAMINOPHEN 10-325 MG PO TABS: ORAL_TABLET | ORAL | 0 refills | Status: DC

## 2019-05-24 MED ORDER — LORAZEPAM 0.5 MG PO TABS: ORAL_TABLET | ORAL | 4 refills | Status: DC

## 2019-05-24 NOTE — Progress Notes (Signed)
Subjective:    Patient ID: Jenna Contreras, female    DOB: 01-16-1946, 74 y.o.   MRN: [id number]  HPI Pt recently lost her husband. Provider needed an office visit set up.  I had approximately a 30-minute discussion with the patient regarding the stress she has been under since the loss of her husband.  Unfortunately there is seem to be multiple different things that went on that stressed her and was suboptimal in transfer of his care at the same time patient was severely debilitated and long-term prognosis was poor Pt states she was also informed to give a urine sample.  This patient was seen today for chronic pain  The medication list was reviewed and updated.   -Compliance with medication: She relates compliance  - Number patient states they take daily: Takes anywhere between 1 and 3 a day  -when was the last dose patient took?  Earlier this week  The patient was advised the importance of maintaining medication and not using illegal substances with these.  Here for refills and follow up  The patient was educated that we can provide 3 monthly scripts for their medication, it is their responsibility to follow the instructions.  Side effects or complications from medications: Denies side effects states pain medicine does make it tolerable for her to function she has had severe back troubles with surgeries and despite surgery still suffers with severe pain  Patient is aware that pain medications are meant to minimize the severity of the pain to allow their pain levels to improve to allow for better function. They are aware of that pain medications cannot totally remove their pain.  Due for UDT ( at least once per year) : Today if possible      Review of Systems  Constitutional: Negative for activity change and appetite change.  HENT: Negative for congestion and rhinorrhea.   Respiratory: Negative for cough and shortness of breath.   Cardiovascular: Negative for chest pain  and leg swelling.  Gastrointestinal: Negative for abdominal pain, nausea and vomiting.  Musculoskeletal: Positive for arthralgias and back pain.  Skin: Negative for color change.  Neurological: Negative for dizziness and weakness.  Psychiatric/Behavioral: Negative for agitation and confusion.       Objective:   Physical Exam Vitals reviewed.  Constitutional:      General: She is not in acute distress. HENT:     Head: Normocephalic and atraumatic.  Eyes:     General:        Right eye: No discharge.        Left eye: No discharge.  Neck:     Trachea: No tracheal deviation.  Cardiovascular:     Rate and Rhythm: Normal rate and regular rhythm.     Heart sounds: Normal heart sounds. No murmur.  Pulmonary:     Effort: Pulmonary effort is normal. No respiratory distress.     Breath sounds: Normal breath sounds.  Lymphadenopathy:     Cervical: No cervical adenopathy.  Skin:    General: Skin is warm and dry.  Neurological:     Mental Status: She is alert.     Coordination: Coordination normal.  Psychiatric:        Behavior: Behavior normal.           Assessment & Plan:  1. Encounter for long-term opiate analgesic use The patient was seen in followup for chronic pain. A review over at their current pain status was discussed. Drug registry was checked. Prescriptions were given.  Discussion was held regarding the importance of compliance with medication as well as pain medication contract.  Time for questions regarding pain management plan occurred. Importance of regular followup visits was discussed. Patient was informed that medication may cause drowsiness and should not be combined  with other medications/alcohol or street drugs. Patient was cautioned that medication could cause drowsiness. If the patient feels medication is causing altered alertness then do not drive or operate dangerous equipment.   - ToxASSURE Select 13 (MW), Urine  2. Other specified  hypothyroidism Continue thyroid medicine.  Check lab work await results - TSH - T4, free  3. Depression, unspecified depression type Continue medication patient needs to get established with psychiatry and counseling she is going through a lot of personal issues related to the loss of her spouse not suicidal - Ambulatory referral to Psychiatry  4. Grief Please see above  30 minutes spent with patient talking with her listening to her documenting reviewing chart in chart preparation

## 2019-05-25 ENCOUNTER — Other Ambulatory Visit: Payer: Self-pay | Admitting: *Deleted

## 2019-05-25 LAB — T4, FREE: Free T4: 2.27 ng/dL — ABNORMAL HIGH (ref 0.82–1.77)

## 2019-05-25 LAB — TSH: TSH: 0.642 u[IU]/mL (ref 0.450–4.500)

## 2019-05-25 IMAGING — DX DG FINGER INDEX 2+V*R*
3 series · 3 of 3 positions shown · non-contrast
Comparison: None.

CLINICAL DATA: Recent CAT bite with pain and swelling

EXAM:
RIGHT INDEX FINGER 2+V

[finger ap]
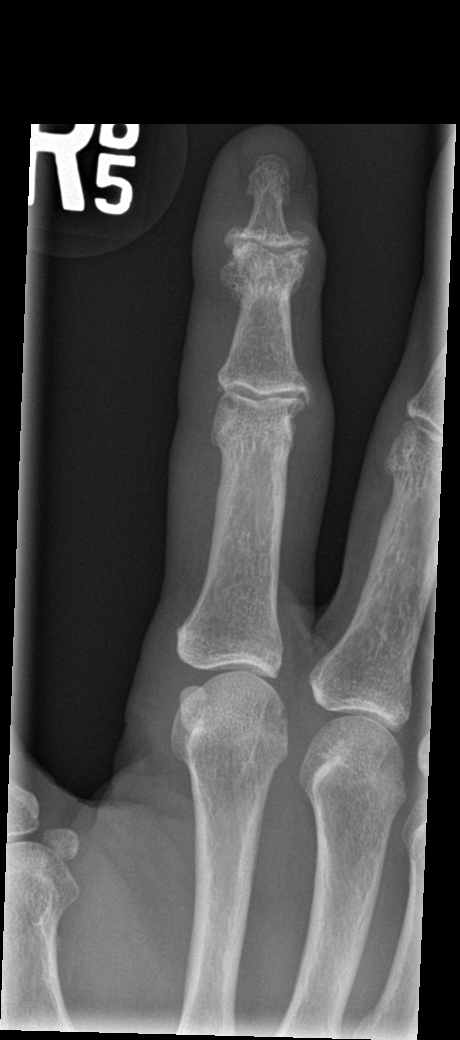

[finger obl]
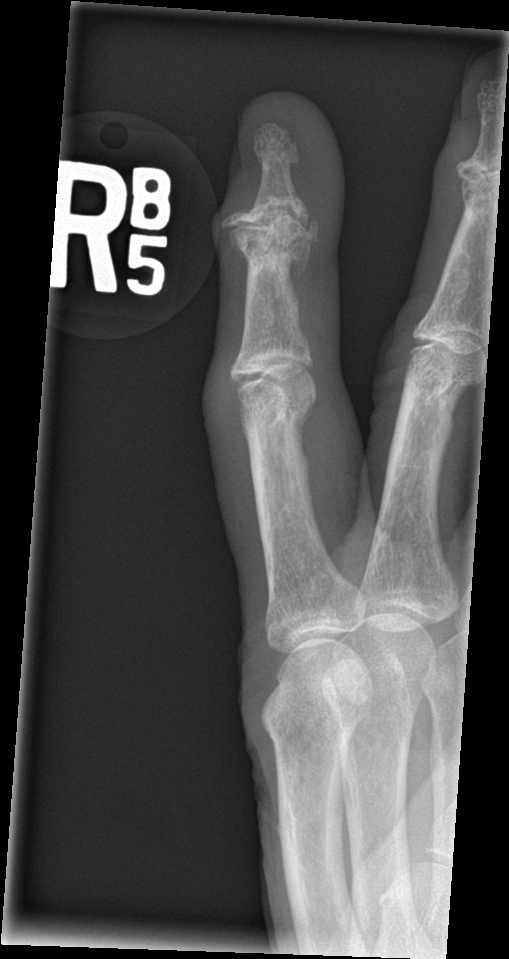

[finger lat]
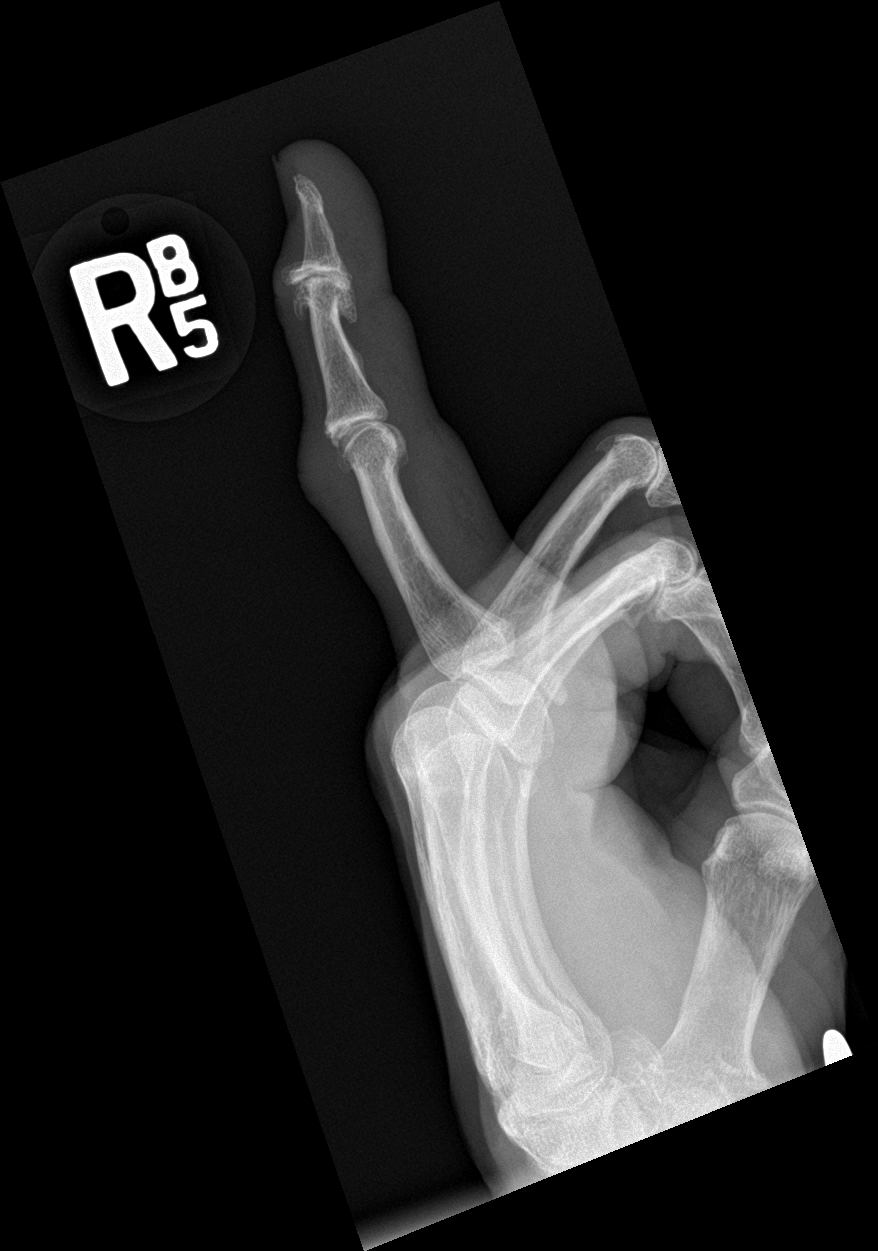

[3 of 3 positions shown; findings below may reference images not displayed]

FINDINGS: Degenerative changes of the interphalangeal joints are seen. Mild
soft tissue swelling is noted related to the recent injury. No
subcutaneous air is seen. No acute fracture is seen. No foreign body
is noted.
IMPRESSION: Soft tissue swelling and degenerative change without acute bony
abnormality.

## 2019-05-25 NOTE — Addendum Note (Signed)
Addended by: Patsy Lager on: 05/25/2019 01:27 PM   Modules accepted: Orders

## 2019-05-29 ENCOUNTER — Encounter: Payer: Self-pay | Admitting: Family Medicine

## 2019-05-29 LAB — TOXASSURE SELECT 13 (MW), URINE

## 2019-06-20 ENCOUNTER — Ambulatory Visit (INDEPENDENT_AMBULATORY_CARE_PROVIDER_SITE_OTHER): Payer: Medicare Other | Admitting: Family Medicine

## 2019-06-20 ENCOUNTER — Other Ambulatory Visit: Payer: Self-pay

## 2019-06-20 DIAGNOSIS — Z029 Encounter for administrative examinations, unspecified: Secondary | ICD-10-CM

## 2019-06-20 NOTE — Progress Notes (Signed)
   Subjective:    Patient ID: Jenna Contreras, female    DOB: Sep 07, 1945, 74 y.o.   MRN: [id number]  HPI Patient calls in today with concerns of 6 tick bites.  Patient was bitten about 2 weeks ago.  Patient also states she had a fall last week where she may or may not have past out. She had a headache afterwards and has been sleeping a lot since. Patient noticed some bruising on her hip and side yesterday.  Virtual Visit via Video Note  I connected with Sholanda Britton-Watkins on 06/20/19 at  1:10 PM EDT by a video enabled telemedicine application and verified that I am speaking with the correct person using two identifiers.  Location: Patient: home Provider: office   I discussed the limitations of evaluation and management by telemedicine and the availability of in person appointments. The patient expressed understanding and agreed to proceed.  History of Present Illness:    Observations/Objective:   Assessment and Plan:   Follow Up Instructions:    I discussed the assessment and treatment plan with the patient. The patient was provided an opportunity to ask questions and all were answered. The patient agreed with the plan and demonstrated an understanding of the instructions.   The patient was advised to call back or seek an in-person evaluation if the symptoms worsen or if the condition fails to improve as anticipated.  I provided 12 minutes of non-face-to-face time during this encounter.    Review of Systems     Objective:   Physical Exam        Assessment & Plan:  Will complete the visit in person in a few days no charge for today

## 2019-06-22 ENCOUNTER — Encounter: Payer: Self-pay | Admitting: Family Medicine

## 2019-06-22 ENCOUNTER — Ambulatory Visit (INDEPENDENT_AMBULATORY_CARE_PROVIDER_SITE_OTHER): Payer: Medicare Other | Admitting: Family Medicine

## 2019-06-22 ENCOUNTER — Other Ambulatory Visit: Payer: Self-pay

## 2019-06-22 VITALS — BP 104/62 | Temp 98.4°F | Wt 126.4 lb

## 2019-06-22 DIAGNOSIS — Z780 Asymptomatic menopausal state: Secondary | ICD-10-CM

## 2019-06-22 DIAGNOSIS — Z13820 Encounter for screening for osteoporosis: Secondary | ICD-10-CM | POA: Diagnosis not present

## 2019-06-22 DIAGNOSIS — Z9181 History of falling: Secondary | ICD-10-CM | POA: Diagnosis not present

## 2019-06-22 DIAGNOSIS — S060X0A Concussion without loss of consciousness, initial encounter: Secondary | ICD-10-CM

## 2019-06-22 DIAGNOSIS — T148XXA Other injury of unspecified body region, initial encounter: Secondary | ICD-10-CM

## 2019-06-22 DIAGNOSIS — W57XXXA Bitten or stung by nonvenomous insect and other nonvenomous arthropods, initial encounter: Secondary | ICD-10-CM

## 2019-06-22 DIAGNOSIS — M545 Low back pain: Secondary | ICD-10-CM | POA: Diagnosis not present

## 2019-06-22 MED ORDER — TRIAMCINOLONE ACETONIDE 0.1 % EX CREA: TOPICAL_CREAM | CUTANEOUS | 1 refills | Status: DC

## 2019-06-22 NOTE — Progress Notes (Signed)
Subjective:    Patient ID: Jenna Contreras, female    DOB: Oct 07, 1945, 74 y.o.   MRN: [id number]  HPI Patient states she had a fall last week where she may or may not have past out. She had a headache afterwards and has been sleeping a lot since. Patient noticed some bruising on her hip and side several days ago.  Concussion without loss of consciousness, initial encounter  Screening for osteoporosis - Plan: DG Bone Density  Post-menopausal - Plan: DG Bone Density  Tick bite, initial encounter  Hematoma Patient with tick bites from the other day no redness no drainage no swelling no fever chills or body aches  Bruising noted from the fall gradually getting better on back  Patient also sustained hit to the head with no bruising no loss of consciousness had headaches for several days with difficulty thinking and processing but now she started to do better with that.  Fall Risk  06/20/2019 01/31/2019 01/18/2019 01/25/2018 02/11/2017  Falls in the past year? 1 1 0 1 Yes  Comment - Emmi Telephone Survey: data to providers prior to load - Emmi Telephone Survey: data to providers prior to load Emmi Telephone Survey: data to providers prior to load  Number falls in past yr: - 1 - 1 1  Comment - Emmi Telephone Survey Actual Response = 1 - Emmi Telephone Survey Actual Response = 3 Emmi Telephone Survey Actual Response = 1  Injury with Fall? 1 1 - 1 No  Follow up - - Falls evaluation completed - -     Review of Systems  Constitutional: Negative for activity change and appetite change.  HENT: Negative for congestion and rhinorrhea.   Respiratory: Negative for cough and shortness of breath.   Cardiovascular: Negative for chest pain and leg swelling.  Gastrointestinal: Negative for abdominal pain, nausea and vomiting.  Musculoskeletal: Positive for back pain. Negative for arthralgias.  Skin: Negative for color change.  Neurological: Negative for dizziness and weakness.    Psychiatric/Behavioral: Negative for agitation and confusion.       Objective:   Physical Exam Vitals reviewed.  Constitutional:      General: She is not in acute distress. HENT:     Head: Normocephalic and atraumatic.  Eyes:     General:        Right eye: No discharge.        Left eye: No discharge.  Neck:     Trachea: No tracheal deviation.  Cardiovascular:     Rate and Rhythm: Normal rate and regular rhythm.     Heart sounds: Normal heart sounds. No murmur.  Pulmonary:     Effort: Pulmonary effort is normal. No respiratory distress.     Breath sounds: Normal breath sounds.  Lymphadenopathy:     Cervical: No cervical adenopathy.  Skin:    General: Skin is warm and dry.  Neurological:     Mental Status: She is alert.     Coordination: Coordination normal.  Psychiatric:        Behavior: Behavior normal.    Balance is good given the fact that she has a rod in her back Neurologic normal Bruising on the ribs bruising on the leg noted        Assessment & Plan:  1. Screening for osteoporosis Bone density indicated - DG Bone Density  2. Post-menopausal Bone density indicated - DG Bone Density  3. Tick bite, initial encounter Small tick bites on the torso no sign of infection warning  signs were discussed in detail  4. Concussion without loss of consciousness, initial encounter Mild intermittent headaches although resolving now.  Cognitive skills are doing well It is felt that the patient probably did have a concussion from her fall but she is gradually getting better no need for CT scan or MRI at this point   5. Hematoma Hematomas are noted on the chest wall and the leg from her fall should gradually get better no need for x-rays

## 2019-06-30 ENCOUNTER — Other Ambulatory Visit (HOSPITAL_COMMUNITY): Payer: Medicare Other

## 2019-08-18 ENCOUNTER — Other Ambulatory Visit: Payer: Self-pay | Admitting: Family Medicine

## 2019-08-20 NOTE — Telephone Encounter (Signed)
Patient will need follow-up office visit within the next 3 to 4 weeks, may pend

## 2019-08-21 NOTE — Telephone Encounter (Signed)
Pt does not need medication filled. She just got it filled.

## 2019-08-21 NOTE — Telephone Encounter (Signed)
lvm to schedule appt.  

## 2019-08-21 NOTE — Telephone Encounter (Signed)
Not able to refused control meds. Only provider can.

## 2019-09-08 ENCOUNTER — Ambulatory Visit: Payer: Medicare Other | Admitting: Family Medicine

## 2019-09-22 ENCOUNTER — Emergency Department (HOSPITAL_COMMUNITY)
Admission: EM | Admit: 2019-09-22 | Discharge: 2019-09-22 | Disposition: A | Discharge status: Home / Self Care | Payer: Medicare Other | Attending: Emergency Medicine | Admitting: Emergency Medicine

## 2019-09-22 ENCOUNTER — Encounter (HOSPITAL_COMMUNITY): Payer: Self-pay | Admitting: Emergency Medicine

## 2019-09-22 ENCOUNTER — Other Ambulatory Visit: Payer: Self-pay

## 2019-09-22 ENCOUNTER — Emergency Department (HOSPITAL_COMMUNITY): Payer: Medicare Other

## 2019-09-22 DIAGNOSIS — Z96641 Presence of right artificial hip joint: Secondary | ICD-10-CM | POA: Diagnosis not present

## 2019-09-22 DIAGNOSIS — Z96653 Presence of artificial knee joint, bilateral: Secondary | ICD-10-CM | POA: Insufficient documentation

## 2019-09-22 DIAGNOSIS — Y9389 Activity, other specified: Secondary | ICD-10-CM | POA: Diagnosis not present

## 2019-09-22 DIAGNOSIS — C4491 Basal cell carcinoma of skin, unspecified: Secondary | ICD-10-CM | POA: Insufficient documentation

## 2019-09-22 DIAGNOSIS — Y92410 Unspecified street and highway as the place of occurrence of the external cause: Secondary | ICD-10-CM | POA: Diagnosis not present

## 2019-09-22 DIAGNOSIS — M545 Low back pain: Secondary | ICD-10-CM | POA: Diagnosis not present

## 2019-09-22 DIAGNOSIS — Z91040 Latex allergy status: Secondary | ICD-10-CM | POA: Insufficient documentation

## 2019-09-22 DIAGNOSIS — E039 Hypothyroidism, unspecified: Secondary | ICD-10-CM | POA: Insufficient documentation

## 2019-09-22 DIAGNOSIS — S7001XA Contusion of right hip, initial encounter: Secondary | ICD-10-CM | POA: Diagnosis not present

## 2019-09-22 DIAGNOSIS — Y999 Unspecified external cause status: Secondary | ICD-10-CM | POA: Diagnosis not present

## 2019-09-22 DIAGNOSIS — Z981 Arthrodesis status: Secondary | ICD-10-CM | POA: Diagnosis not present

## 2019-09-22 DIAGNOSIS — Z87891 Personal history of nicotine dependence: Secondary | ICD-10-CM | POA: Insufficient documentation

## 2019-09-22 DIAGNOSIS — R0781 Pleurodynia: Secondary | ICD-10-CM | POA: Diagnosis not present

## 2019-09-22 DIAGNOSIS — S79911A Unspecified injury of right hip, initial encounter: Secondary | ICD-10-CM | POA: Diagnosis not present

## 2019-09-22 DIAGNOSIS — S8991XA Unspecified injury of right lower leg, initial encounter: Secondary | ICD-10-CM | POA: Diagnosis not present

## 2019-09-22 DIAGNOSIS — S39012A Strain of muscle, fascia and tendon of lower back, initial encounter: Secondary | ICD-10-CM | POA: Insufficient documentation

## 2019-09-22 DIAGNOSIS — S8992XA Unspecified injury of left lower leg, initial encounter: Secondary | ICD-10-CM | POA: Diagnosis not present

## 2019-09-22 DIAGNOSIS — J45909 Unspecified asthma, uncomplicated: Secondary | ICD-10-CM | POA: Diagnosis not present

## 2019-09-22 MED ORDER — METHOCARBAMOL 500 MG PO TABS
500.0000 mg | ORAL_TABLET | Freq: Three times a day (TID) | ORAL | 0 refills | Status: DC | PRN
Start: 2019-09-22 — End: 2019-10-17
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
## 2019-09-23 ENCOUNTER — Encounter (HOSPITAL_COMMUNITY): Payer: Self-pay | Admitting: Emergency Medicine

## 2019-09-23 ENCOUNTER — Other Ambulatory Visit: Payer: Self-pay

## 2019-09-23 ENCOUNTER — Emergency Department (HOSPITAL_COMMUNITY)
Admission: EM | Admit: 2019-09-23 | Discharge: 2019-09-23 | Disposition: A | Discharge status: Home / Self Care | Payer: Medicare Other | Attending: Emergency Medicine | Admitting: Emergency Medicine

## 2019-09-23 DIAGNOSIS — T07XXXA Unspecified multiple injuries, initial encounter: Secondary | ICD-10-CM

## 2019-09-23 DIAGNOSIS — Z23 Encounter for immunization: Secondary | ICD-10-CM | POA: Diagnosis not present

## 2019-09-23 DIAGNOSIS — S80811A Abrasion, right lower leg, initial encounter: Secondary | ICD-10-CM | POA: Insufficient documentation

## 2019-09-23 DIAGNOSIS — S99911A Unspecified injury of right ankle, initial encounter: Secondary | ICD-10-CM | POA: Diagnosis present

## 2019-09-23 DIAGNOSIS — Y939 Activity, unspecified: Secondary | ICD-10-CM | POA: Diagnosis not present

## 2019-09-23 DIAGNOSIS — Y929 Unspecified place or not applicable: Secondary | ICD-10-CM | POA: Insufficient documentation

## 2019-09-23 DIAGNOSIS — E039 Hypothyroidism, unspecified: Secondary | ICD-10-CM | POA: Diagnosis not present

## 2019-09-23 DIAGNOSIS — S93401A Sprain of unspecified ligament of right ankle, initial encounter: Secondary | ICD-10-CM | POA: Insufficient documentation

## 2019-09-23 DIAGNOSIS — Y999 Unspecified external cause status: Secondary | ICD-10-CM | POA: Insufficient documentation

## 2019-09-23 DIAGNOSIS — Z79899 Other long term (current) drug therapy: Secondary | ICD-10-CM | POA: Insufficient documentation

## 2019-09-23 DIAGNOSIS — Z87891 Personal history of nicotine dependence: Secondary | ICD-10-CM | POA: Diagnosis not present

## 2019-09-23 DIAGNOSIS — W010XXA Fall on same level from slipping, tripping and stumbling without subsequent striking against object, initial encounter: Secondary | ICD-10-CM | POA: Insufficient documentation

## 2019-09-23 DIAGNOSIS — Z91040 Latex allergy status: Secondary | ICD-10-CM | POA: Insufficient documentation

## 2019-09-23 DIAGNOSIS — J45909 Unspecified asthma, uncomplicated: Secondary | ICD-10-CM | POA: Insufficient documentation

## 2019-09-23 MED ORDER — TETANUS-DIPHTH-ACELL PERTUSSIS 5-2.5-18.5 LF-MCG/0.5 IM SUSP
0.5000 mL | Freq: Once | INTRAMUSCULAR | Status: AC
  Administered 2019-09-23: 0.5 mL via INTRAMUSCULAR
  Filled 2019-09-23: qty 0.5
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
## 2019-09-23 NOTE — Discharge Instructions (Addendum)
Continue to care for the wound of your right lower leg with soap and water daily, and a light bandage, until it heals.  Wear the ankle splint as needed for comfort, especially when walking.  You can try using ice on it for a day or 2 to help the swelling and discomfort.  Use Tylenol or your narcotic pain reliever, as needed for pain relief.  Do not drive when taking the narcotic pain reliever.  Follow-up with your primary care doctor if not better in 1 to 2 weeks.  See your orthopedic doctor for checkup of your back, hip and right knee discomfort, next week.

## 2019-09-23 NOTE — ED Notes (Signed)
Wound care done.  Large bandaide applied.
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
## 2019-09-29 ENCOUNTER — Other Ambulatory Visit: Payer: Self-pay | Admitting: Family Medicine

## 2019-09-29 MED ORDER — HYDROCODONE-ACETAMINOPHEN 10-325 MG PO TABS: ORAL_TABLET | ORAL | 0 refills | Status: DC

## 2019-09-29 NOTE — Telephone Encounter (Signed)
Med was sent in

## 2019-09-29 NOTE — Telephone Encounter (Signed)
Med pended. Will call pt after its sent to pharm
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
## 2019-09-29 NOTE — Telephone Encounter (Signed)
Discussed with pt and transferred to front to scheduled appt

## 2019-09-29 NOTE — Telephone Encounter (Signed)
Last pain management 05/24/19

## 2019-09-29 NOTE — Telephone Encounter (Signed)
Patient may have 3 weeks worth of medicine 1 twice daily of the hydrocodone 10/325 please pend patient needs to be scheduled for follow-up visit for pain management the week of August 9

## 2019-10-17 ENCOUNTER — Ambulatory Visit (INDEPENDENT_AMBULATORY_CARE_PROVIDER_SITE_OTHER): Payer: Medicare Other | Admitting: Family Medicine

## 2019-10-17 ENCOUNTER — Other Ambulatory Visit: Payer: Self-pay

## 2019-10-17 VITALS — BP 122/72 | Temp 97.5°F | Wt 111.0 lb

## 2019-10-17 DIAGNOSIS — M546 Pain in thoracic spine: Secondary | ICD-10-CM

## 2019-10-17 DIAGNOSIS — E7849 Other hyperlipidemia: Secondary | ICD-10-CM | POA: Diagnosis not present

## 2019-10-17 DIAGNOSIS — R079 Chest pain, unspecified: Secondary | ICD-10-CM | POA: Diagnosis not present

## 2019-10-17 DIAGNOSIS — E038 Other specified hypothyroidism: Secondary | ICD-10-CM

## 2019-10-17 DIAGNOSIS — Z79899 Other long term (current) drug therapy: Secondary | ICD-10-CM

## 2019-10-17 DIAGNOSIS — G8929 Other chronic pain: Secondary | ICD-10-CM

## 2019-10-17 MED ORDER — HYDROCODONE-ACETAMINOPHEN 10-325 MG PO TABS: ORAL_TABLET | ORAL | 0 refills | Status: DC

## 2019-10-17 MED ORDER — LORAZEPAM 0.5 MG PO TABS: ORAL_TABLET | ORAL | 5 refills | Status: DC
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
## 2019-10-23 ENCOUNTER — Encounter: Payer: Self-pay | Admitting: Family Medicine

## 2019-11-01 ENCOUNTER — Telehealth: Payer: Self-pay | Admitting: Family Medicine

## 2019-11-01 NOTE — Telephone Encounter (Signed)
Pt called, states at last OV we were going to give her exercise info for her hip & knee  Nothing on AVS - please advise & I can mail to the patient

## 2019-11-16 ENCOUNTER — Telehealth: Payer: Self-pay | Admitting: Family Medicine

## 2019-11-16 MED ORDER — LEVOTHYROXINE SODIUM 88 MCG PO TABS: 88.0000 ug | ORAL_TABLET | Freq: Every day | ORAL | 0 refills | Status: DC

## 2019-11-16 MED ORDER — DICLOFENAC SODIUM 75 MG PO TBEC: 75.0000 mg | DELAYED_RELEASE_TABLET | Freq: Two times a day (BID) | ORAL | 0 refills | Status: DC

## 2019-11-16 MED ORDER — LAMOTRIGINE 100 MG PO TABS: 100.0000 mg | ORAL_TABLET | Freq: Two times a day (BID) | ORAL | 0 refills | Status: DC

## 2019-11-16 NOTE — Telephone Encounter (Signed)
Refills sent to pharmacy and pt is aware. Pt verbalized understanding  
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
## 2019-11-16 NOTE — Telephone Encounter (Signed)
May have 90-day on all of these, do her lab work before following up in December or November Also I will print off exercises for the patient

## 2019-11-16 NOTE — Telephone Encounter (Signed)
levothyroxine (SYNTHROID) 88 MCG tablet lamoTRIgine (LAMICTAL) 100 MG tablet  diclofenac (VOLTAREN) 75 MG EC tablet  [facility] - [place], [place] - [address]  Pt is requesting refills on these medications has appt in Nov.

## 2019-11-16 NOTE — Telephone Encounter (Signed)
Please advise. Thank you

## 2019-11-29 ENCOUNTER — Telehealth: Payer: Self-pay | Admitting: Internal Medicine

## 2019-11-29 NOTE — Telephone Encounter (Signed)
Spoke with the pharmacist and gave clarification on rx for proair 2 puffs every 4 hours as needed  Nothing further needed

## 2019-12-06 ENCOUNTER — Telehealth: Payer: Self-pay | Admitting: Internal Medicine

## 2019-12-06 DIAGNOSIS — R059 Cough, unspecified: Secondary | ICD-10-CM

## 2019-12-06 DIAGNOSIS — R911 Solitary pulmonary nodule: Secondary | ICD-10-CM

## 2019-12-06 NOTE — Telephone Encounter (Signed)
Do high-resolution CT chest supine and prone for indication of dyspnea and lung nodules

## 2019-12-06 NOTE — Telephone Encounter (Signed)
Spoke with patient regarding prior message.Patient is having some SOB and was wondering if patient can have a CT scan done before patient see's Dr.Ramaswamy.Pateint stated she has not had a CT scan in 3 years.  Dr.Ramaswamy can you please advise.  Thank you

## 2019-12-06 NOTE — Telephone Encounter (Addendum)
Spoke with pateint regarding prior message. Advised pateitrn per [facility] ok to put order in for CT supine and prone for indication of dyspnea and lung nodules.Pateint is aware and voice was understanding. Nothing else further needed.

## 2019-12-06 NOTE — Addendum Note (Signed)
Addended by: June Leap on: 12/06/2019 03:32 PM   Modules accepted: Orders

## 2019-12-11 ENCOUNTER — Ambulatory Visit: Payer: Medicare Other | Admitting: Internal Medicine

## 2020-01-02 ENCOUNTER — Ambulatory Visit (HOSPITAL_COMMUNITY): Payer: Medicare Other

## 2020-01-09 ENCOUNTER — Telehealth: Payer: Self-pay | Admitting: Internal Medicine

## 2020-01-09 NOTE — Telephone Encounter (Addendum)
Rescheduled pt's CT & gave appt info to her.  Nothing further needed.

## 2020-01-18 ENCOUNTER — Other Ambulatory Visit: Payer: Self-pay

## 2020-01-18 ENCOUNTER — Ambulatory Visit (HOSPITAL_COMMUNITY)
Admission: RE | Admit: 2020-01-18 | Discharge: 2020-01-18 | Disposition: A | Discharge status: Home / Self Care | Payer: Medicare Other | Source: Ambulatory Visit | Attending: Internal Medicine | Admitting: Internal Medicine

## 2020-01-18 DIAGNOSIS — R911 Solitary pulmonary nodule: Secondary | ICD-10-CM | POA: Diagnosis present

## 2020-01-19 ENCOUNTER — Other Ambulatory Visit: Payer: Self-pay

## 2020-01-19 ENCOUNTER — Ambulatory Visit (INDEPENDENT_AMBULATORY_CARE_PROVIDER_SITE_OTHER): Payer: Medicare Other | Admitting: Primary Care

## 2020-01-19 VITALS — BP 98/64 | HR 75 | Ht 62.0 in | Wt 114.4 lb

## 2020-01-19 DIAGNOSIS — Z7189 Other specified counseling: Secondary | ICD-10-CM | POA: Diagnosis not present

## 2020-01-19 DIAGNOSIS — J4521 Mild intermittent asthma with (acute) exacerbation: Secondary | ICD-10-CM | POA: Diagnosis not present

## 2020-01-19 DIAGNOSIS — Z23 Encounter for immunization: Secondary | ICD-10-CM

## 2020-01-19 DIAGNOSIS — J4520 Mild intermittent asthma, uncomplicated: Secondary | ICD-10-CM

## 2020-01-19 DIAGNOSIS — R911 Solitary pulmonary nodule: Secondary | ICD-10-CM

## 2020-01-19 DIAGNOSIS — F4321 Adjustment disorder with depressed mood: Secondary | ICD-10-CM

## 2020-01-19 MED ORDER — ALBUTEROL SULFATE (2.5 MG/3ML) 0.083% IN NEBU: 2.5000 mg | INHALATION_SOLUTION | Freq: Four times a day (QID) | RESPIRATORY_TRACT | 12 refills | Status: DC | PRN

## 2020-01-19 MED ORDER — FLOVENT HFA 110 MCG/ACT IN AERO: 1.0000 | INHALATION_SPRAY | Freq: Two times a day (BID) | RESPIRATORY_TRACT | 3 refills | Status: DC

## 2020-01-19 MED ORDER — MONTELUKAST SODIUM 10 MG PO TABS: 10.0000 mg | ORAL_TABLET | Freq: Every day | ORAL | 11 refills | Status: DC

## 2020-01-19 MED ORDER — ALBUTEROL SULFATE (2.5 MG/3ML) 0.083% IN NEBU
2.5000 mg | INHALATION_SOLUTION | Freq: Four times a day (QID) | RESPIRATORY_TRACT | 12 refills | Status: DC | PRN
Start: 2020-01-19 — End: 2020-01-19

## 2020-01-19 NOTE — Patient Instructions (Addendum)
Imaging: High resolution CT CHEST- No evidence of interstitial lung disease. Scattered small solid pulmonary nodules in both lungs are all stable and considered benign, largest 54mm RUL. Aortic Atherosclerosis. Two vessel coronary atherosclerosis.   Recommendations: Use flovent inhaler 1 puff twice daily as needed for asthma exacerbation Use albuterol inahler or nebulizer every 6 hours as needed for asthma exacerbation  Resume singulair 10mg  at bedtime   Rx: Refill Singulair 10mg  at bedtime [facility]  Orders: New nebulizer machine re: asthma   Referral: Grief counseling (would prefer not to go to [facility])  Follow-up: 6 months with [name] or Beth    Asthma, Adult  Asthma is a long-term (chronic) condition in which the airways get tight and narrow. The airways are the breathing passages that lead from the nose and mouth down into the lungs. A person with asthma will have times when symptoms get worse. These are called asthma attacks. They can cause coughing, whistling sounds when you breathe (wheezing), shortness of breath, and chest pain. They can make it hard to breathe. There is no cure for asthma, but medicines and lifestyle changes can help control it. There are many things that can bring on an asthma attack or make asthma symptoms worse (triggers). Common triggers include:  Mold.  Dust.  Cigarette smoke.  Cockroaches.  Things that can cause allergy symptoms (allergens). These include animal skin flakes (dander) and pollen from trees or grass.  Things that pollute the air. These may include household cleaners, wood smoke, smog, or chemical odors.  Cold air, weather changes, and wind.  Crying or laughing hard.  Stress.  Certain medicines or drugs.  Certain foods such as dried fruit, potato chips, and grape juice.  Infections, such as a cold or the flu.  Certain medical conditions or diseases.  Exercise or tiring  activities. Asthma may be treated with medicines and by staying away from the things that cause asthma attacks. Types of medicines may include:  Controller medicines. These help prevent asthma symptoms. They are usually taken every day.  Fast-acting reliever or rescue medicines. These quickly relieve asthma symptoms. They are used as needed and provide short-term relief.  Allergy medicines if your attacks are brought on by allergens.  Medicines to help control the body's defense (immune) system. Follow these instructions at home: Avoiding triggers in your home  Change your heating and air conditioning filter often.  Limit your use of fireplaces and wood stoves.  Get rid of pests (such as roaches and mice) and their droppings.  Throw away plants if you see mold on them.  Clean your floors. Dust regularly. Use cleaning products that do not smell.  Have someone vacuum when you are not home. Use a vacuum cleaner with a HEPA filter if possible.  Replace carpet with wood, tile, or vinyl flooring. Carpet can trap animal skin flakes and dust.  Use allergy-proof pillows, mattress covers, and box spring covers.  Wash bed sheets and blankets every week in hot water. Dry them in a dryer.  Keep your bedroom free of any triggers.  Avoid pets and keep windows closed when things that cause allergy symptoms are in the air.  Use blankets that are made of polyester or cotton.  Clean bathrooms and kitchens with bleach. If possible, have someone repaint the walls in these rooms with mold-resistant paint. Keep out of the rooms that are being cleaned and painted.  Wash your hands often with soap and water. If soap and water  are not available, use hand sanitizer.  Do not allow anyone to smoke in your home. General instructions  Take over-the-counter and prescription medicines only as told by your doctor. ? Talk with your doctor if you have questions about how or when to take your  medicines. ? Make note if you need to use your medicines more often than usual.  Do not use any products that contain nicotine or tobacco, such as cigarettes and e-cigarettes. If you need help quitting, ask your doctor.  Stay away from secondhand smoke.  Avoid doing things outdoors when allergen counts are high and when air quality is low.  Wear a ski mask when doing outdoor activities in the winter. The mask should cover your nose and mouth. Exercise indoors on cold days if you can.  Warm up before you exercise. Take time to cool down after exercise.  Use a peak flow meter as told by your doctor. A peak flow meter is a tool that measures how well the lungs are working.  Keep track of the peak flow meter's readings. Write them down.  Follow your asthma action plan. This is a written plan for taking care of your asthma and treating your attacks.  Make sure you get all the shots (vaccines) that your doctor recommends. Ask your doctor about a flu shot and a pneumonia shot.  Keep all follow-up visits as told by your doctor. This is important. Contact a doctor if:  You have wheezing, shortness of breath, or a cough even while taking medicine to prevent attacks.  The mucus you cough up (sputum) is thicker than usual.  The mucus you cough up changes from clear or white to yellow, green, gray, or bloody.  You have problems from the medicine you are taking, such as: ? A rash. ? Itching. ? Swelling. ? Trouble breathing.  You need reliever medicines more than 2-3 times a week.  Your peak flow reading is still at 50-79% of your personal best after following the action plan for 1 hour.  You have a fever. Get help right away if:  You seem to be worse and are not responding to medicine during an asthma attack.  You are short of breath even at rest.  You get short of breath when doing very little activity.  You have trouble eating, drinking, or talking.  You have chest pain or  tightness.  You have a fast heartbeat.  Your lips or fingernails start to turn blue.  You are light-headed or dizzy, or you faint.  Your peak flow is less than 50% of your personal best.  You feel too tired to breathe normally. Summary  Asthma is a long-term (chronic) condition in which the airways get tight and narrow. An asthma attack can make it hard to breathe.  Asthma cannot be cured, but medicines and lifestyle changes can help control it.  Make sure you understand how to avoid triggers and how and when to use your medicines. This information is not intended to replace advice given to you by your health care provider. Make sure you discuss any questions you have with your health care provider. Document Revised: 04/28/2018 Document Reviewed: 03/30/2016 Elsevier Patient Education  [place].   Asthma Attack  Acute bronchospasm caused by asthma is also referred to as an asthma attack. Bronchospasm means that the air passages become narrowed or "tight," which limits the amount of oxygen that can get into the lungs. The narrowing is caused by inflammation and tightening  of the muscles in the air tubes (bronchi) in the lungs. Excessive mucus is also produced, which narrows the airways more. This can cause trouble breathing, coughing, and loud breathing (wheezing). What are the causes? Possible triggers include:  Animal dander from the skin, hair, or feathers of animals.  Dust mites contained in house dust.  Cockroaches.  Pollen from trees or grass.  Mold.  Cigarette or tobacco smoke.  Air pollutants such as dust, household cleaners, hair sprays, aerosol sprays, paint fumes, strong chemicals, or strong odors.  Cold air or weather changes. Cold air may trigger inflammation. Winds increase molds and pollens in the air.  Strong emotions such as crying or laughing hard.  Stress.  Certain medicines, such as aspirin or beta-blockers.  Sulfites in foods and drinks,  such as dried fruits and wine.  Infections or inflammatory conditions, such as a flu, a cold, pneumonia, or inflammation of the nasal membranes (rhinitis).  Gastroesophageal reflux disease (GERD). GERD is a condition in which stomach acid backs up into your esophagus, which can irritate nearby airway structures.  Exercise or activity that requires a lot of energy. What are the signs or symptoms? Symptoms of this condition include:  Wheezing. This may sound like whistling while breathing. This may be more noticeable at night.  Excessive coughing, particularly at night.  Chest tightness or pain.  Shortness of breath.  Feeling like you cannot get enough air no matter how hard you try (air hunger). How is this diagnosed? This condition may be diagnosed based on:  Your medical history.  Your symptoms.  A physical exam.  Tests to check for other causes of your symptoms or other conditions that may have triggered your asthma attack. These tests may include: ? Chest X-ray. ? Blood tests. ? Specialized tests to assess lung function, such as breathing into a device that measures how much air you inhale and exhale (spirometry). How is this treated? The goal of treatment is to open the airways in your lungs and reduce inflammation. Most asthma attacks are treated with medicines that you inhale through a hand-held inhaler (metered dose inhaler, MDI) or a device that turns liquid medicine into a mist that you inhale (nebulizer). Medicines may include:  Quick relief or rescue medicines that relax the muscles of the bronchi. These medicines include bronchodilators, such as albuterol.  Controller medicines, such as inhaled corticosteroids. These are long-acting medicines that are used for daily asthma maintenance. If you have a moderate or severe asthma attack, you may be treated with steroid medicines by mouth or through an IV injection at the hospital. Steroid medicines reduce inflammation in  your lungs. Depending on the severity of your attack, you may need oxygen therapy to help you breathe. If your asthma attack was caused by a bacterial infection, such as pneumonia, you will be given antibiotic medicines. Follow these instructions at home: Medicines  Take over-the-counter and prescription medicines only as told by your health care provider. Keep your medicines up-to-date and available.  If you are more than [redacted] weeks pregnant and you are prescribed any new medicines, tell your obstetrician about those medicines.  If you were prescribed an antibiotic medicine, take it as told by your health care provider. Do not stop taking the antibiotic even if you start to feel better. Avoiding triggers   Keep track of things that trigger your asthma attacks or cause you to have breathing problems, and avoid exposure to these triggers.  Do not use any products that contain  nicotine or tobacco, such as cigarettes and e-cigarettes. If you need help quitting, ask your health care provider.  Avoid secondhand smoke.  Avoid strong smells, such as perfumes, aerosols, and cleaning solvents.  When pollen or air pollution is bad, keep windows closed and use an air conditioner or go to places with air conditioning. Asthma action plan  Work with your health care provider to make a written plan for managing and treating your asthma attacks (asthma action plan). This plan should include: ? A list of your asthma triggers and how to avoid them. ? Information about when your medicines should be taken and when their dosage should be changed. ? Instructions about using a device called a peak flow meter to monitor your condition. A peak flow meter measures how well your lungs are working and measures how severe your asthma is at a given time. Your "personal best" is the highest peak flow rate you can reach when you feel good and have no asthma symptoms. General instructions  Avoid excessive exercise or  activity until your asthma attack resolves. Ask your health care provider what activities are safe for you and when you can return to your normal activities.  Stay up to date on all vaccinations recommended by your health care provider, such as flu and pneumonia vaccines.  Drink enough fluid to keep your urine clear or pale yellow. Staying hydrated helps keep mucus in your lungs thin so it can be coughed up easily.  If you drink caffeine, do so in moderation.  Do not use alcohol until you have recovered.  Keep all follow-up visits as told by your health care provider. This is important. Asthma requires careful medical care, and you and your health care provider can work together to reduce the likelihood of future attacks. Contact a health care provider if:  Your peak flow reading is still at 50-79% of your personal best after you have followed your action plan for 1 hour. This is in the yellow zone, which means "caution."  You need to use a reliever medicine more than 2-3 times a week.  Your medicines are causing side effects, such as: ? Rash. ? Itching. ? Swelling. ? Trouble breathing.  Your symptoms do not improve after 48 hours.  You cough up mucus (sputum) that is thicker than usual.  You have a fever.  You need to use your medicines much more frequently than normal. Get help right away if:  Your peak flow reading is less than 50% of your personal best. This is in the red zone, which means "danger."  You have severe trouble breathing.  You develop chest pain or discomfort.  Your medicines no longer seem to be helping.  You vomit.  You cannot eat or drink without vomiting.  You are coughing up yellow, green, brown, or bloody mucus.  You have a fever and your symptoms suddenly get worse.  You have trouble swallowing.  You feel very tired, and breathing becomes tiring. Summary  Acute bronchospasm caused by asthma is also referred to as an asthma  attack.  Bronchospasm is caused by narrowing or tightness in air passages, which causes shortness of breath, coughing, and loud breathing (wheezing).  Many things can trigger an asthma attack, such as allergens, weather changes, exercise, smoke, and other fumes.  Treatment for an asthma attack may include inhaled rescue medicines for immediate relief, as well as the use of maintenance therapy.  Get help right away if you have worsening shortness of breath,  chest pain, or fever, or if your home medicines are no longer helping with your symptoms. This information is not intended to replace advice given to you by your health care provider. Make sure you discuss any questions you have with your health care provider. Document Revised: 06/14/2018 Document Reviewed: 03/27/2016 Elsevier Patient Education  [place].
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
## 2020-01-22 ENCOUNTER — Encounter: Payer: Self-pay | Admitting: Primary Care

## 2020-01-22 DIAGNOSIS — F4321 Adjustment disorder with depressed mood: Secondary | ICD-10-CM | POA: Insufficient documentation
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
## 2020-01-22 NOTE — Assessment & Plan Note (Signed)
-   HRCT in November 2012 showed no evidence of interstitial lung disease. Scattered small solid pulmonary nodules in both lungs are all stable and considered benign, largest 3mm RUL. No further imaging warranted at this time.   

## 2020-01-22 NOTE — Assessment & Plan Note (Addendum)
Stable interval, no recent exacerbations. Not currently using ICS maintenance inhaler. She has only used Albuterol 2 times in the last several weeks. Recommend patient use Flovent 1 puff twice daily. Continue albuterol inahler or nebulizer every 6 hours as needed for sob/wheezing. Resume singulair 10mg  at bedtime. We may be able to taper off ICS if continues to do well. FU in 6 months with [name]   Plan: Resume Flovent 170mcg 1 puff twice daily  Continue Albuterol 2 puffs every 4-6 hours as needed for sob/wheezing Refill Singulair 10mg  at bedtime ([facility])  Orders: New nebulizer machine RE: asthma

## 2020-01-23 ENCOUNTER — Ambulatory Visit: Payer: Medicare Other | Admitting: Family Medicine

## 2020-01-29 ENCOUNTER — Telehealth: Payer: Self-pay | Admitting: *Deleted

## 2020-01-29 NOTE — Telephone Encounter (Signed)
Dr [name] ordered a CT of her lungs and their office has been trying to contact her for the results (CT scan note) but the patient saw that she had blockages in her arteries and aorta and she would like Dr Nicki Reaper to call and review her results and what it means and what she needs to do   Brand Males, MD  01/22/2020 6:06 AM EST     IMPRESSION: 1. No evidence of interstitial lung disease. 2. Scattered small solid pulmonary nodules are all stable and considered benign. 3. Two-vessel coronary atherosclerosis. 4. Aortic Atherosclerosis (ICD10-I70.0).

## 2020-01-29 NOTE — Telephone Encounter (Signed)
This would need a phone visit set up either Tuesday or Wednesday.

## 2020-01-29 NOTE — Telephone Encounter (Signed)
Patient will call specialist for their results and scheduled phone visit with Dr Nicki Reaper 01/31/20 at 2pm to discuss further.

## 2020-01-30 ENCOUNTER — Telehealth: Payer: Self-pay | Admitting: Primary Care

## 2020-01-30 ENCOUNTER — Ambulatory Visit: Payer: Medicare Other | Admitting: Dermatology

## 2020-01-30 DIAGNOSIS — I700 Atherosclerosis of aorta: Secondary | ICD-10-CM

## 2020-01-30 NOTE — Telephone Encounter (Signed)
Patient is returning phone call. Patient phone number is [phone].

## 2020-01-30 NOTE — Telephone Encounter (Signed)
Referral was placed. Spoke with pt. She is aware of this information.

## 2020-01-30 NOTE — Telephone Encounter (Signed)
Yes, please refer RE: exertion dyspnea, dizziness and 2 vessel coronary atherosclerosis

## 2020-01-30 NOTE — Telephone Encounter (Addendum)
lmtcb for pt.   Looks like pt has CT results needing to be given to her.

## 2020-01-30 NOTE — Telephone Encounter (Signed)
Brand Males, MD  01/22/2020 6:06 AM EST     IMPRESSION: 1. No evidence of interstitial lung disease. 2. Scattered small solid pulmonary nodules are all stable and considered benign. 3. Two-vessel coronary atherosclerosis. 4. Aortic Atherosclerosis (ICD10-I70.0).  --------------------------------------------------- Spoke with pt. She is aware of results. Pt states that she talked with her PCP about the findings of the cardiac issues. Her PCP told her that we would need to be the ones to refer her somewhere if needed. Pt would like to be referred to [facility] in [place], [place].  Beth - please advise. Thanks.

## 2020-01-31 ENCOUNTER — Telehealth (INDEPENDENT_AMBULATORY_CARE_PROVIDER_SITE_OTHER): Payer: Medicare Other | Admitting: Family Medicine

## 2020-01-31 ENCOUNTER — Other Ambulatory Visit: Payer: Self-pay

## 2020-01-31 DIAGNOSIS — F4321 Adjustment disorder with depressed mood: Secondary | ICD-10-CM | POA: Diagnosis not present

## 2020-01-31 DIAGNOSIS — E7849 Other hyperlipidemia: Secondary | ICD-10-CM

## 2020-01-31 DIAGNOSIS — I700 Atherosclerosis of aorta: Secondary | ICD-10-CM

## 2020-01-31 DIAGNOSIS — I2510 Atherosclerotic heart disease of native coronary artery without angina pectoris: Secondary | ICD-10-CM | POA: Insufficient documentation

## 2020-01-31 DIAGNOSIS — R918 Other nonspecific abnormal finding of lung field: Secondary | ICD-10-CM
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
## 2020-02-22 DIAGNOSIS — Z029 Encounter for administrative examinations, unspecified: Secondary | ICD-10-CM

## 2020-03-09 IMAGING — MR MR HIP*R* W/O CM
4 of 6 series · 11 of 40 positions shown · non-contrast
Comparison: CT abdomen and pelvis 02/03/2017.

CLINICAL DATA: Right hip pain in a patient with a right hip
replacement. Question gluteal tear.

EXAM:
MR OF THE RIGHT HIP WITHOUT CONTRAST
TECHNIQUE: Multiplanar, multisequence MR imaging was performed. No intravenous
contrast was administered.

[Series 2: t1_tse_tra · axial · 4.0mm · 0.56mm/px · z∈[-81,+74]mm · 3 of 43 slices shown]
[im 6/43]
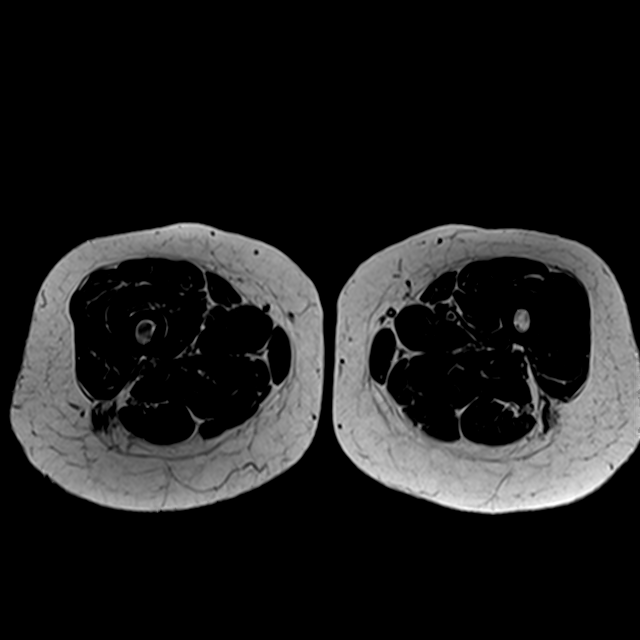
[im 22/43]
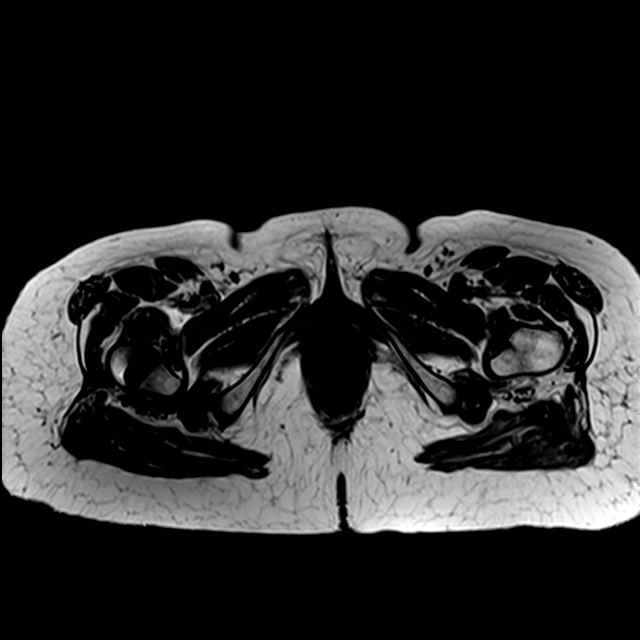
[im 37/43]
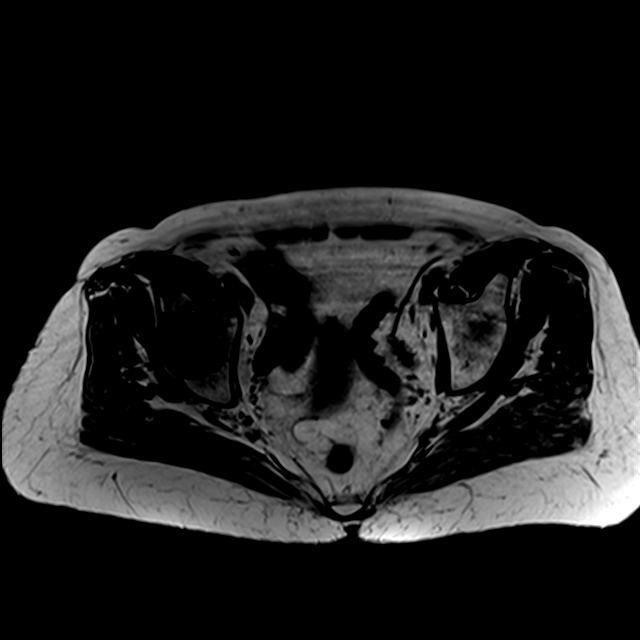

[Series 3: t2_tse_tra · axial · 4.0mm · 0.48mm/px · z∈[-76,+74]mm · 3 of 43 slices shown]
[im 7/43]
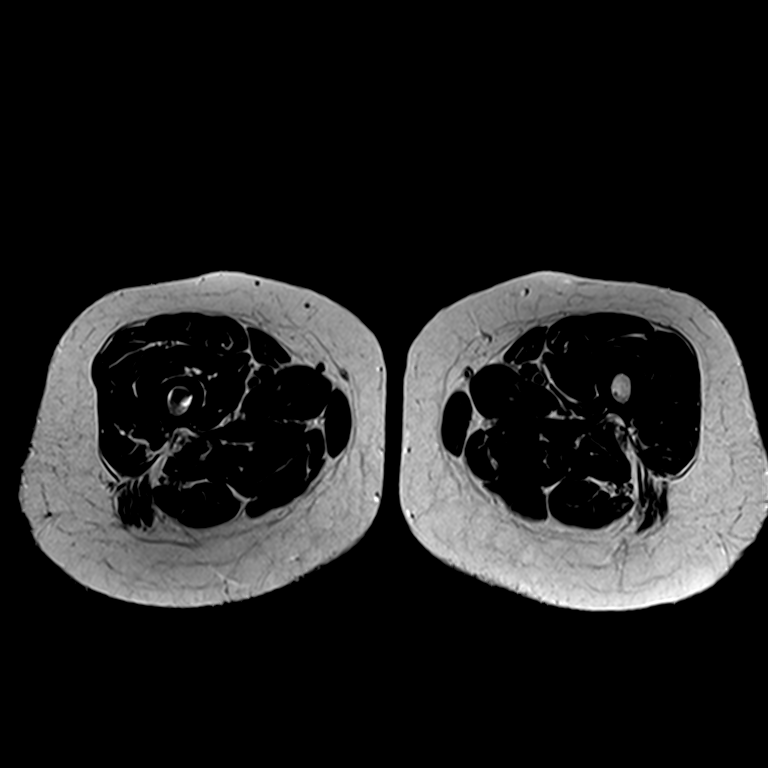
[im 25/43]
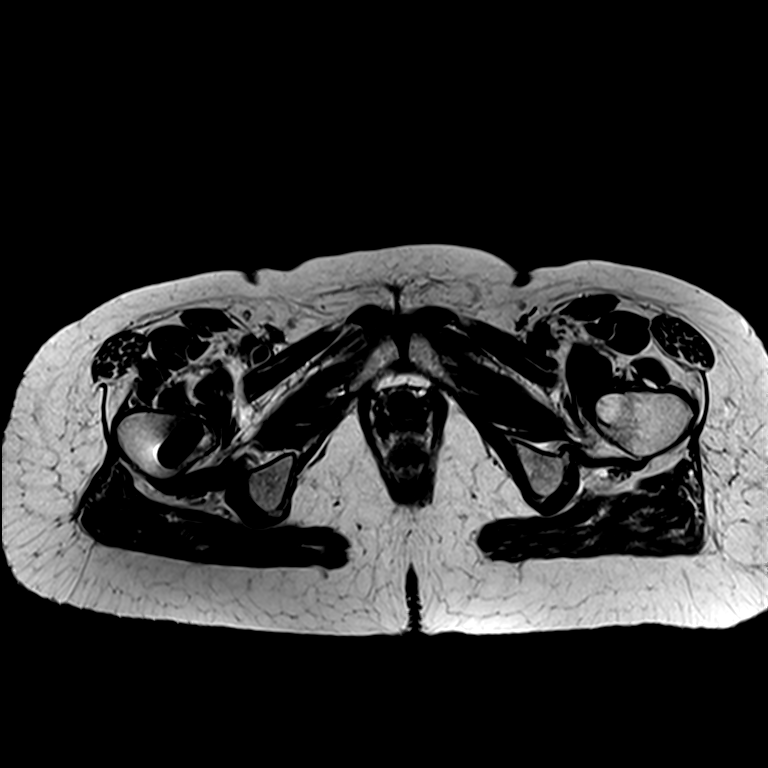
[im 37/43]
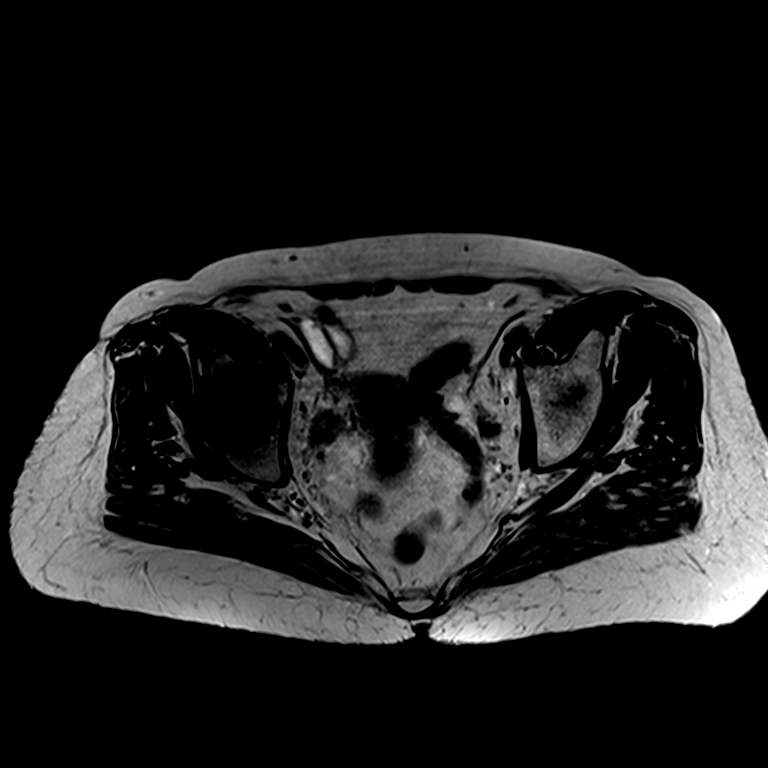

[Series 4: ir axial · axial · 4.0mm · 0.68mm/px · z∈[-76,+74]mm · 3 of 43 slices shown]
[im 7/43]
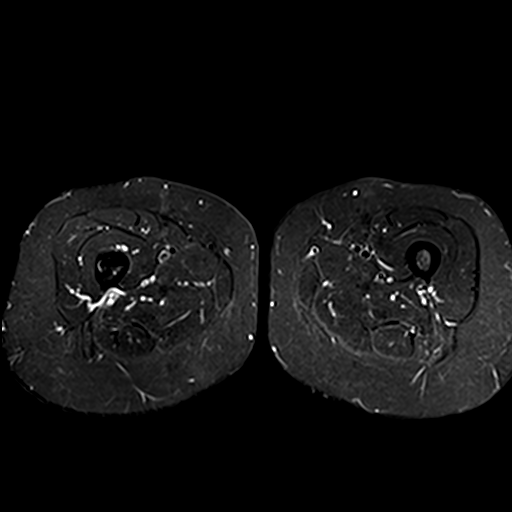
[im 25/43]
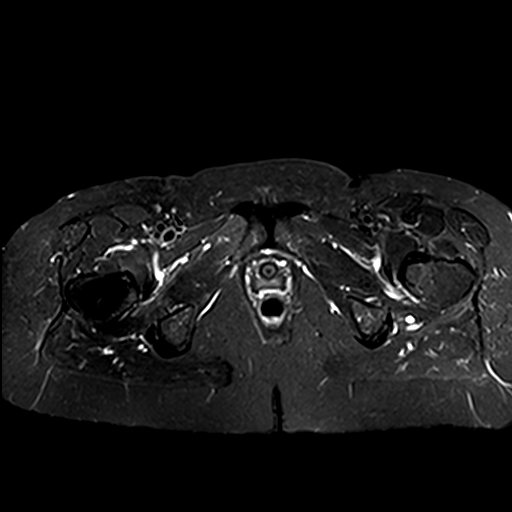
[im 37/43]
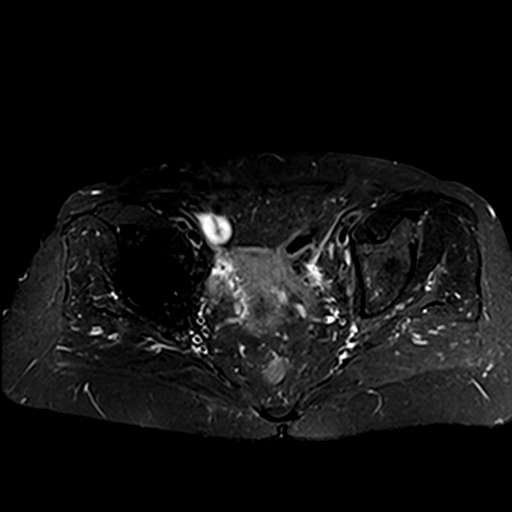

[Series 5: t1_tse_cor · coronal · 4.0mm · 0.42mm/px · 2 of 28 slices shown]
[im 1/28]
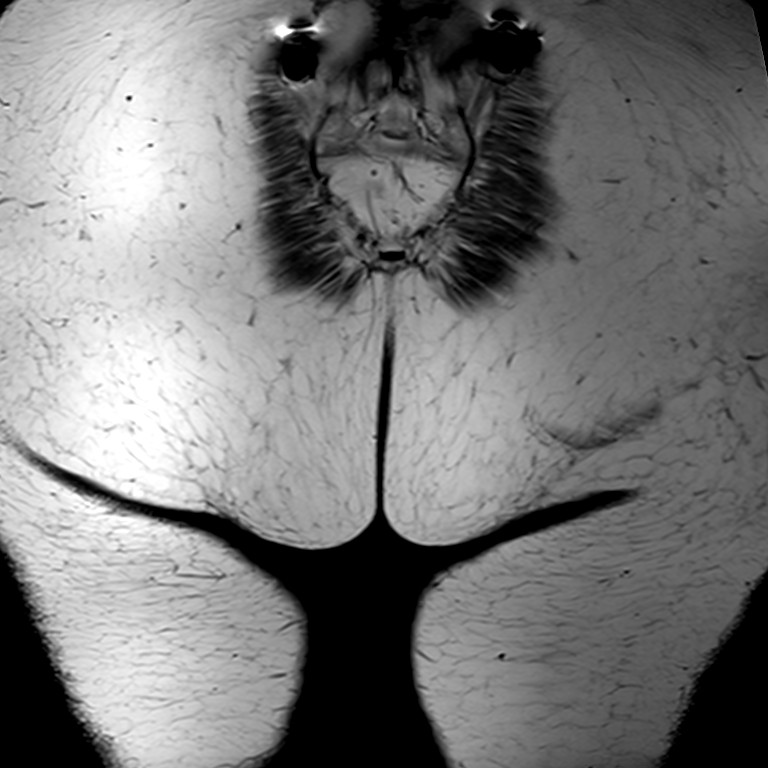
[im 14/28]
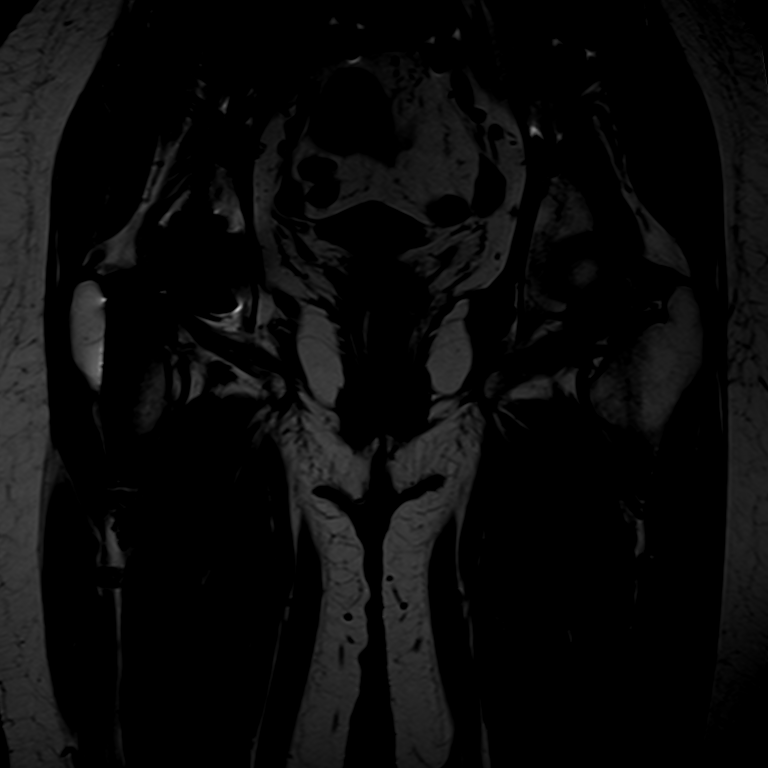

[11 of 40 positions shown; findings below may reference images not displayed]

FINDINGS: Bones: Artifact from a right hip replacement is identified. Also
seen is artifact from hardware for lower lumbar to sacrum and iliac
fusion. Bone marrow signal is normal without fracture, stress change
or focal lesion.

Articular cartilage and labrum

Articular cartilage: Not applicable on the right. Appears normal on
the left.

Labrum:  Not applicable on the right.  Appears normal on the left.

Joint or bursal effusion

Joint effusion:  None.

Bursae: Normal.

Muscles and tendons

Muscles and tendons: Intact and normal in appearance without tear or
atrophy. In particular, the gluteal musculature is intact
bilaterally and symmetric in appearance.

Other findings

Miscellaneous:   Imaged intrapelvic contents are unremarkable.
IMPRESSION: Negative for gluteal tear. No acute abnormality or finding to
explain the patient's symptoms.

Status post right hip replacement without evidence of complication.
Partially imaged spinal fusion hardware also noted.

## 2020-03-18 ENCOUNTER — Encounter: Payer: Self-pay | Admitting: Family Medicine

## 2020-03-18 ENCOUNTER — Telehealth: Payer: Self-pay | Admitting: *Deleted

## 2020-03-18 ENCOUNTER — Other Ambulatory Visit: Payer: Self-pay

## 2020-03-18 ENCOUNTER — Ambulatory Visit (INDEPENDENT_AMBULATORY_CARE_PROVIDER_SITE_OTHER): Payer: Medicare Other | Admitting: Family Medicine

## 2020-03-18 VITALS — BP 92/64 | HR 74 | Temp 97.5°F | Ht 61.25 in | Wt 115.0 lb

## 2020-03-18 DIAGNOSIS — F32A Depression, unspecified: Secondary | ICD-10-CM

## 2020-03-18 DIAGNOSIS — Z0000 Encounter for general adult medical examination without abnormal findings: Secondary | ICD-10-CM

## 2020-03-18 DIAGNOSIS — Z1231 Encounter for screening mammogram for malignant neoplasm of breast: Secondary | ICD-10-CM

## 2020-03-18 DIAGNOSIS — R1900 Intra-abdominal and pelvic swelling, mass and lump, unspecified site: Secondary | ICD-10-CM

## 2020-03-18 DIAGNOSIS — E785 Hyperlipidemia, unspecified: Secondary | ICD-10-CM

## 2020-03-18 DIAGNOSIS — E038 Other specified hypothyroidism: Secondary | ICD-10-CM | POA: Diagnosis not present

## 2020-03-18 DIAGNOSIS — R109 Unspecified abdominal pain: Secondary | ICD-10-CM | POA: Diagnosis not present

## 2020-03-18 MED ORDER — LEVOTHYROXINE SODIUM 88 MCG PO TABS: 88.0000 ug | ORAL_TABLET | Freq: Every day | ORAL | 1 refills | Status: DC

## 2020-03-18 MED ORDER — HYDROCODONE-ACETAMINOPHEN 10-325 MG PO TABS: ORAL_TABLET | ORAL | 0 refills | Status: DC

## 2020-03-18 MED ORDER — RIZATRIPTAN BENZOATE 10 MG PO TABS: ORAL_TABLET | ORAL | 5 refills | Status: DC

## 2020-03-18 MED ORDER — LAMOTRIGINE 100 MG PO TABS: 100.0000 mg | ORAL_TABLET | Freq: Two times a day (BID) | ORAL | 1 refills | Status: DC

## 2020-03-18 NOTE — Telephone Encounter (Signed)
These were signed thank you 

## 2020-03-18 NOTE — Telephone Encounter (Signed)
Pt requested lamitical, maxalt and [facility] and [facility]one refills to go to [facility]. All meds are pended if you agree to refills. Ready to sign

## 2020-03-18 NOTE — Telephone Encounter (Signed)
Patient left message stating she forgot to ask if the ultrasound you ordered today was related or would also see the blockage that showed up on a previous CT scan and if this could be causing chest pain

## 2020-03-18 NOTE — Progress Notes (Signed)
Subjective:    Patient ID: Jenna Contreras, female    DOB: [birth year], 75 y.o.   MRN: [id number]  HPI AWV- Annual Wellness Visit  The patient was seen for their annual wellness visit. The patient's past medical history, surgical history, and family history were reviewed. Pertinent vaccines were reviewed ( tetanus, pneumonia, shingles, flu) The patient's medication list was reviewed and updated.  The height and weight were entered.  BMI recorded in electronic record elsewhere  Cognitive screening was completed. Outcome of Mini - Cog: declines memory test and wanted to talk with provider first.    Falls /depression screening electronically recorded within record elsewhere  Current tobacco usage: none (All patients who use tobacco were given written and verbal information on quitting)  Recent listing of emergency department/hospitalizations over the past year were reviewed.  current specialist the patient sees on a regular basis: dr Purnell Shoemaker   (pulmomary)   Medicare annual wellness visit patient questionnaire was reviewed.  A written screening schedule for the patient for the next 5-10 years was given. Appropriate discussion of followup regarding next visit was discussed.  Pt has concerns about memory but declined for me to give her a memory test. States she wants to talk with provider first.   Patient passes her memory scan but we did talk about ways to try to remember better details  lump on upper abdomen that is hard and has been there for maybe a year.   Patient is due for colonoscopy as well as mammogram.  Patient also having intermittent abdominal pain and discomfort in the mid abdomen she states she has lost weight because she is eating a special diet and now she notices a pulsatile mass in her abdomen.  Patient has thyroid condition.  Takes thyroid medication on a regular basis.  States that the proper way.  Relates compliance.  States no negative side effects.   States condition seems to be under good control.      Review of Systems  Constitutional: Negative for activity change, fatigue and fever.  HENT: Negative for congestion and rhinorrhea.   Respiratory: Negative for cough, chest tightness and shortness of breath.   Cardiovascular: Negative for chest pain and leg swelling.  Gastrointestinal: Negative for abdominal pain and nausea.  Musculoskeletal: Positive for arthralgias and back pain.  Skin: Negative for color change.  Neurological: Negative for dizziness and headaches.  Psychiatric/Behavioral: Negative for agitation and behavioral problems.       Objective:   Physical Exam Vitals reviewed.  Constitutional:      General: She is not in acute distress.    Appearance: She is well-nourished.  HENT:     Head: Normocephalic.  Cardiovascular:     Rate and Rhythm: Normal rate and regular rhythm.     Heart sounds: Normal heart sounds. No murmur heard.   Pulmonary:     Effort: Pulmonary effort is normal.     Breath sounds: Normal breath sounds.  Musculoskeletal:        General: No edema.  Lymphadenopathy:     Cervical: No cervical adenopathy.  Neurological:     Mental Status: She is alert.  Psychiatric:        Behavior: Behavior normal.     Possible pulsatile mass I believe this is because she is lost weight we can feel her aorta but she complains of tenderness in the mid abdomen therefore we will do a ultrasound      Assessment & Plan:  1. Routine general medical  examination at a health care facility Adult wellness-complete.wellness physical was conducted today. Importance of diet and exercise were discussed in detail.  In addition to this a discussion regarding safety was also covered. We also reviewed over immunizations and gave recommendations regarding current immunization needed for age.  In addition to this additional areas were also touched on including: Preventative health exams needed:  Colonoscopy recommend  colonoscopy ultrasound abdomen ordered  Patient was advised yearly wellness exam  - Amylase - Lipase - TSH - T4, free - CBC with Differential/Platelet - Hepatic function panel - Basic metabolic panel - Lipid panel  2. Other specified hypothyroidism Check lab work continue medication - TSH - T4, free  3. Abdominal pain, unspecified abdominal location Check ultrasound check lab work continue medication - US Abdomen Complete - Amylase - Lipase - CBC with Differential/Platelet - Hepatic function panel - Basic metabolic panel  4. Pulsatile abdominal mass See discussion above check lab work ultrasound - US Abdomen Complete  5. Encounter for screening mammogram for breast cancer Mammogram ordered - MM DIGITAL SCREENING BILATERAL  6. Hyperlipidemia, unspecified hyperlipidemia type Cholesterol profile continue healthy diet - Lipid panel  7. Depression, unspecified depression type Would benefit from some counseling for the loss of her husband this is been tried several times and apparently is just been very difficult to get anything scheduled - Ambulatory referral to Psychiatry

## 2020-03-19 ENCOUNTER — Other Ambulatory Visit: Payer: Self-pay | Admitting: Family Medicine

## 2020-03-19 DIAGNOSIS — E038 Other specified hypothyroidism: Secondary | ICD-10-CM

## 2020-03-19 LAB — LIPID PANEL
Chol/HDL Ratio: 2.2 ratio (ref 0.0–4.4)
Cholesterol, Total: 237 mg/dL — ABNORMAL HIGH (ref 100–199)
HDL: 110 mg/dL (ref >39)
LDL Chol Calc (NIH): 119 mg/dL — ABNORMAL HIGH (ref 0–99)
Triglycerides: 45 mg/dL (ref 0–149)
VLDL Cholesterol Cal: 8 mg/dL (ref 5–40)

## 2020-03-19 LAB — CBC WITH DIFFERENTIAL/PLATELET
Basophils Absolute: 0.1 10*3/uL (ref 0.0–0.2)
Basos: 1 %
EOS (ABSOLUTE): 0.1 10*3/uL (ref 0.0–0.4)
Eos: 1 %
Hematocrit: 41 % (ref 34.0–46.6)
Hemoglobin: 13.3 g/dL (ref 11.1–15.9)
Immature Grans (Abs): 0 10*3/uL (ref 0.0–0.1)
Immature Granulocytes: 0 %
Lymphocytes Absolute: 2.4 10*3/uL (ref 0.7–3.1)
Lymphs: 35 %
MCH: 28.9 pg (ref 26.6–33.0)
MCHC: 32.4 g/dL (ref 31.5–35.7)
MCV: 89 fL (ref 79–97)
Monocytes Absolute: 0.4 10*3/uL (ref 0.1–0.9)
Monocytes: 6 %
Neutrophils Absolute: 3.9 10*3/uL (ref 1.4–7.0)
Neutrophils: 57 %
Platelets: 286 10*3/uL (ref 150–450)
RBC: 4.61 x10E6/uL (ref 3.77–5.28)
RDW: 13 % (ref 11.7–15.4)
WBC: 6.9 10*3/uL (ref 3.4–10.8)

## 2020-03-19 LAB — AMYLASE: Amylase: 52 U/L (ref 31–110)

## 2020-03-19 LAB — BASIC METABOLIC PANEL
BUN/Creatinine Ratio: 16 (ref 12–28)
BUN: 13 mg/dL (ref 8–27)
CO2: 23 mmol/L (ref 20–29)
Calcium: 9.9 mg/dL (ref 8.7–10.3)
Chloride: 102 mmol/L (ref 96–106)
Creatinine, Ser: 0.83 mg/dL (ref 0.57–1.00)
GFR calc Af Amer: 79 mL/min/{1.73_m2} (ref >59)
GFR calc non Af Amer: 69 mL/min/{1.73_m2} (ref >59)
Glucose: 94 mg/dL (ref 65–99)
Potassium: 4.2 mmol/L (ref 3.5–5.2)
Sodium: 138 mmol/L (ref 134–144)

## 2020-03-19 LAB — LIPASE: Lipase: 14 U/L (ref 14–85)

## 2020-03-19 LAB — T4, FREE: Free T4: 2.46 ng/dL — ABNORMAL HIGH (ref 0.82–1.77)

## 2020-03-19 LAB — HEPATIC FUNCTION PANEL
ALT: 10 IU/L (ref 0–32)
AST: 12 IU/L (ref 0–40)
Albumin: 4.7 g/dL (ref 3.7–4.7)
Alkaline Phosphatase: 80 IU/L (ref 44–121)
Bilirubin Total: 0.5 mg/dL (ref 0.0–1.2)
Bilirubin, Direct: 0.16 mg/dL (ref 0.00–0.40)
Total Protein: 7 g/dL (ref 6.0–8.5)

## 2020-03-19 LAB — TSH: TSH: 0.29 u[IU]/mL — ABNORMAL LOW (ref 0.450–4.500)

## 2020-03-19 MED ORDER — LEVOTHYROXINE SODIUM 75 MCG PO TABS: ORAL_TABLET | ORAL | 1 refills | Status: DC

## 2020-03-19 NOTE — Telephone Encounter (Signed)
Patient advised per Dr Nicki Reaper: What showed up on the previous CAT scan which was a chest CAT scan Dr Nicki Reaper does not believe is causing her abdominal symptoms.  Dr Nicki Reaper  would recommend the ultrasound. Patient verbalized understanding.

## 2020-03-19 NOTE — Telephone Encounter (Signed)
What showed up on the previous CAT scan which was a chest CAT scan I do not believe is causing her abdominal symptoms.  I would recommend the ultrasound.

## 2020-03-27 ENCOUNTER — Other Ambulatory Visit: Payer: Self-pay | Admitting: Family Medicine

## 2020-03-28 ENCOUNTER — Other Ambulatory Visit: Payer: Self-pay

## 2020-03-28 ENCOUNTER — Ambulatory Visit (HOSPITAL_COMMUNITY)
Admission: RE | Admit: 2020-03-28 | Discharge: 2020-03-28 | Disposition: A | Discharge status: Home / Self Care | Payer: Medicare Other | Source: Ambulatory Visit | Attending: Family Medicine | Admitting: Family Medicine

## 2020-03-28 DIAGNOSIS — R109 Unspecified abdominal pain: Secondary | ICD-10-CM | POA: Insufficient documentation

## 2020-03-28 DIAGNOSIS — R1900 Intra-abdominal and pelvic swelling, mass and lump, unspecified site: Secondary | ICD-10-CM | POA: Insufficient documentation

## 2020-03-29 ENCOUNTER — Ambulatory Visit (HOSPITAL_COMMUNITY)
Admission: RE | Admit: 2020-03-29 | Discharge: 2020-03-29 | Disposition: A | Discharge status: Home / Self Care | Payer: Medicare Other | Source: Ambulatory Visit | Attending: Family Medicine | Admitting: Family Medicine

## 2020-03-29 DIAGNOSIS — Z1231 Encounter for screening mammogram for malignant neoplasm of breast: Secondary | ICD-10-CM | POA: Diagnosis not present

## 2020-04-15 ENCOUNTER — Ambulatory Visit: Payer: Medicare Other | Admitting: Cardiology

## 2020-05-10 ENCOUNTER — Other Ambulatory Visit: Payer: Self-pay | Admitting: Family Medicine

## 2020-05-10 DIAGNOSIS — M542 Cervicalgia: Secondary | ICD-10-CM | POA: Diagnosis not present

## 2020-05-10 DIAGNOSIS — M546 Pain in thoracic spine: Secondary | ICD-10-CM | POA: Diagnosis not present

## 2020-05-14 ENCOUNTER — Telehealth: Payer: Self-pay | Admitting: Primary Care

## 2020-05-14 DIAGNOSIS — J4521 Mild intermittent asthma with (acute) exacerbation: Secondary | ICD-10-CM

## 2020-05-14 MED ORDER — ALBUTEROL SULFATE (2.5 MG/3ML) 0.083% IN NEBU: 2.5000 mg | INHALATION_SOLUTION | Freq: Four times a day (QID) | RESPIRATORY_TRACT | 12 refills | Status: DC | PRN

## 2020-05-14 NOTE — Telephone Encounter (Signed)
Called and spoke with patient. She stated that she was seen by [facility] back in November and was prescribed albuterol nebulizer solution. She stated that she never received this medication. I reviewed her chart and it appears this medication was sent [employer]. She stated that she has never used [employer] and is not sure how this got added to her chart. She uses [employer]. Advised her that I would delete [facility] from her list and send the solution to [facility].   While on the phone, she stated that she does not have a nebulizer machine to use. She wanted to know how to get one. I explained to her the process of getting one. Did offer to see if [facility] sells them.   Called [facility] and spoke with a Occupational psychologist. She stated that she do sale nebulizers and we can fax the orders to them to be processed.   Called and spoke with patient. She wishes to have a RX for the nebulizer to be sent to [facility]. Will go ahead and print the RX and have Beth sign.   Nothing further needed at time of call.

## 2020-05-14 NOTE — Telephone Encounter (Signed)
Pt states [facility] ordered medication for nebulizer, does not have nebulizer-pt never received medication or nebulizer. Pt is now in need of nebulizer and medication- had asthma attack. Pt would like this sent to [facility]. Please advise [phone]

## 2020-05-27 ENCOUNTER — Ambulatory Visit (HOSPITAL_COMMUNITY): Payer: Medicare Other | Admitting: Psychiatry

## 2020-06-10 ENCOUNTER — Other Ambulatory Visit: Payer: Self-pay

## 2020-06-10 ENCOUNTER — Ambulatory Visit (HOSPITAL_COMMUNITY): Payer: Medicare Other | Admitting: Psychiatry

## 2020-06-19 ENCOUNTER — Other Ambulatory Visit: Payer: Self-pay | Admitting: Internal Medicine

## 2020-06-24 ENCOUNTER — Ambulatory Visit (INDEPENDENT_AMBULATORY_CARE_PROVIDER_SITE_OTHER): Payer: Medicare Other | Admitting: Family Medicine

## 2020-06-24 ENCOUNTER — Encounter: Payer: Self-pay | Admitting: Family Medicine

## 2020-06-24 ENCOUNTER — Other Ambulatory Visit: Payer: Self-pay

## 2020-06-24 VITALS — BP 119/68 | HR 64 | Temp 97.3°F | Ht 61.5 in | Wt 121.0 lb

## 2020-06-24 DIAGNOSIS — Z13820 Encounter for screening for osteoporosis: Secondary | ICD-10-CM

## 2020-06-24 DIAGNOSIS — E7849 Other hyperlipidemia: Secondary | ICD-10-CM

## 2020-06-24 DIAGNOSIS — M546 Pain in thoracic spine: Secondary | ICD-10-CM | POA: Diagnosis not present

## 2020-06-24 DIAGNOSIS — F325 Major depressive disorder, single episode, in full remission: Secondary | ICD-10-CM

## 2020-06-24 DIAGNOSIS — G8929 Other chronic pain: Secondary | ICD-10-CM | POA: Diagnosis not present

## 2020-06-24 DIAGNOSIS — E038 Other specified hypothyroidism: Secondary | ICD-10-CM | POA: Diagnosis not present

## 2020-06-24 DIAGNOSIS — E559 Vitamin D deficiency, unspecified: Secondary | ICD-10-CM | POA: Diagnosis not present

## 2020-06-24 DIAGNOSIS — Z780 Asymptomatic menopausal state: Secondary | ICD-10-CM | POA: Diagnosis not present

## 2020-06-24 MED ORDER — HYDROCODONE-ACETAMINOPHEN 10-325 MG PO TABS
ORAL_TABLET | ORAL | 0 refills | Status: DC
Start: 2020-06-24 — End: 2020-10-29

## 2020-06-24 MED ORDER — LORAZEPAM 0.5 MG PO TABS: ORAL_TABLET | ORAL | 5 refills | Status: DC

## 2020-06-24 MED ORDER — HYDROCODONE-ACETAMINOPHEN 10-325 MG PO TABS: ORAL_TABLET | ORAL | 0 refills | Status: DC

## 2020-06-24 NOTE — Progress Notes (Signed)
Subjective:    Patient ID: Jenna Contreras, female    DOB: 03/19/1945, 75 y.o.   MRN: [id number]  HPI This patient was seen today for chronic pain  The medication list was reviewed and updated.  Location of Pain for which the patient has been treated with regarding narcotics: Patient has pain in her neck as well as hip and lower back  Onset of this pain: She has had this pain for multiple years   -Compliance with medication: She is actually been doing better lately utilizing muscle relaxers on a sparing basis never with pain medicine and she has been able to cut her pain medication down to just once per day  - Number patient states they take daily:1 every 3 days    -when was the last dose patient took? 8pm last night  The patient was advised the importance of maintaining medication and not using illegal substances with these.  Here for refills and follow up  The patient was educated that we can provide 3 monthly scripts for their medication, it is their responsibility to follow the instructions.  Side effects or complications from medications: no  Patient is aware that pain medications are meant to minimize the severity of the pain to allow their pain levels to improve to allow for better function. They are aware of that pain medications cannot totally remove their pain.  Due for UDT ( at least once per year) : last done 05/24/2019  Scale of 1 to 10 ( 1 is least 10 is most) Your pain level without the medicine: 7 Your pain level with medication 4  Scale 1 to 10 ( 1-helps very little, 10 helps very well) How well does your pain medication reduce your pain so you can function better through out the day? 3  Quality of the pain: manageable  Persistence of the pain: sporadic  Modifying factors: laying down     Other specified hypothyroidism - Plan: TSH, T4, free  Chronic bilateral thoracic back pain  Other hyperlipidemia  Vitamin D deficiency - Plan: VITAMIN D 25  Hydroxy (Vit-D Deficiency, Fractures)  Post-menopausal - Plan: DG Bone Density  Encounter for screening for osteoporosis - Plan: DG Bone Density  Depression, major, single episode, complete remission ([place])  Patient moods overall are doing fairly well.  She is due for a bone density test.  She also is due for vitamin D due to history of vitamin D deficiency and also has hyperlipidemia and trying to watch her diet    Review of Systems  Constitutional: Negative for activity change and appetite change.  HENT: Negative for congestion and rhinorrhea.   Respiratory: Negative for cough and shortness of breath.   Cardiovascular: Negative for chest pain and leg swelling.  Gastrointestinal: Negative for abdominal pain, nausea and vomiting.  Skin: Negative for color change.  Neurological: Negative for dizziness and weakness.  Psychiatric/Behavioral: Negative for agitation and confusion.       Objective:   Physical Exam Vitals reviewed.  Constitutional:      General: She is not in acute distress. HENT:     Head: Normocephalic.  Cardiovascular:     Rate and Rhythm: Normal rate and regular rhythm.     Heart sounds: Normal heart sounds. No murmur heard.   Pulmonary:     Effort: Pulmonary effort is normal.     Breath sounds: Normal breath sounds.  Lymphadenopathy:     Cervical: No cervical adenopathy.  Neurological:     Mental Status: She  is alert.  Psychiatric:        Behavior: Behavior normal.   She has a rod in her back and was unable to flex her back she essentially can only move with her neck and with her hips        Assessment & Plan:  1. Other specified hypothyroidism Thyroid function ordered.  Continue current medication - TSH - T4, free  2. Chronic bilateral thoracic back pain The patient was seen in followup for chronic pain. A review over at their current pain status was discussed. Drug registry was checked. Prescriptions were given.  Regular follow-up  recommended. Discussion was held regarding the importance of compliance with medication as well as pain medication contract.  Patient was informed that medication may cause drowsiness and should not be combined  with other medications/alcohol or street drugs. If the patient feels medication is causing altered alertness then do not drive or operate dangerous equipment.  She saw a back specialist who prescribed baclofen to use when necessary for back spasms she states that this seems to be doing well for her she is using it sparingly because of this medicine she states she has not had to use much hydrocodone and therefore we will reduce the hydrocodone to no more than 30/month  She was cautioned not to take hydrocodone and muscle relaxers together  3. Other hyperlipidemia Watch diet closely stay physically active check lab work later this year  4. Vitamin D deficiency Patient takes 4000 units daily await vitamin D level - VITAMIN D 25 Hydroxy (Vit-D Deficiency, Fractures)  5. Post-menopausal Patient is due for a bone density test - DG Bone Density  6. Encounter for screening for osteoporosis Bone density test - DG Bone Density  Depression resolved.  Insomnia anxiety is doing better she is only using lorazepam at nighttime to help her sleep she does not take it with hydrocodone

## 2020-06-25 ENCOUNTER — Ambulatory Visit (HOSPITAL_COMMUNITY)
Admission: RE | Admit: 2020-06-25 | Discharge: 2020-06-25 | Disposition: A | Discharge status: Home / Self Care | Payer: Medicare Other | Source: Ambulatory Visit | Attending: Family Medicine | Admitting: Family Medicine

## 2020-06-25 DIAGNOSIS — Z780 Asymptomatic menopausal state: Secondary | ICD-10-CM | POA: Diagnosis not present

## 2020-06-25 DIAGNOSIS — E559 Vitamin D deficiency, unspecified: Secondary | ICD-10-CM | POA: Diagnosis not present

## 2020-06-25 DIAGNOSIS — Z13820 Encounter for screening for osteoporosis: Secondary | ICD-10-CM | POA: Diagnosis not present

## 2020-06-25 DIAGNOSIS — E038 Other specified hypothyroidism: Secondary | ICD-10-CM | POA: Diagnosis not present

## 2020-06-25 DIAGNOSIS — M810 Age-related osteoporosis without current pathological fracture: Secondary | ICD-10-CM | POA: Diagnosis not present

## 2020-06-26 ENCOUNTER — Encounter: Payer: Self-pay | Admitting: Family Medicine

## 2020-06-26 LAB — TSH: TSH: 2.5 u[IU]/mL (ref 0.450–4.500)

## 2020-06-26 LAB — T4, FREE: Free T4: 1.77 ng/dL (ref 0.82–1.77)

## 2020-06-26 LAB — VITAMIN D 25 HYDROXY (VIT D DEFICIENCY, FRACTURES): Vit D, 25-Hydroxy: 53.3 ng/mL (ref 30.0–100.0)

## 2020-07-07 ENCOUNTER — Encounter: Payer: Self-pay | Admitting: Family Medicine

## 2020-07-18 ENCOUNTER — Encounter: Payer: Self-pay | Admitting: Primary Care

## 2020-07-18 ENCOUNTER — Other Ambulatory Visit: Payer: Self-pay

## 2020-07-18 ENCOUNTER — Ambulatory Visit (INDEPENDENT_AMBULATORY_CARE_PROVIDER_SITE_OTHER): Payer: Medicare Other | Admitting: Primary Care

## 2020-07-18 VITALS — BP 118/74 | HR 72 | Temp 98.3°F | Ht 62.0 in | Wt 117.4 lb

## 2020-07-18 DIAGNOSIS — F4321 Adjustment disorder with depressed mood: Secondary | ICD-10-CM

## 2020-07-18 DIAGNOSIS — J4530 Mild persistent asthma, uncomplicated: Secondary | ICD-10-CM | POA: Diagnosis not present

## 2020-07-18 DIAGNOSIS — R0602 Shortness of breath: Secondary | ICD-10-CM | POA: Diagnosis not present

## 2020-07-18 MED ORDER — FLOVENT HFA 110 MCG/ACT IN AERO: 1.0000 | INHALATION_SPRAY | Freq: Two times a day (BID) | RESPIRATORY_TRACT | 3 refills | Status: DC

## 2020-07-18 MED ORDER — LORATADINE 10 MG PO TABS: 10.0000 mg | ORAL_TABLET | Freq: Every day | ORAL | 3 refills | Status: DC | PRN

## 2020-07-18 MED ORDER — ALBUTEROL SULFATE HFA 108 (90 BASE) MCG/ACT IN AERS: INHALATION_SPRAY | RESPIRATORY_TRACT | 6 refills | Status: AC
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
## 2020-07-18 NOTE — Patient Instructions (Addendum)
Recommendations: - Start Flovent inhaler 1 puff morning and evening every day (rinse mouth after use) - Use albuterol inhaler 2 puffs every 4-6 hours as needed for breakthrough shortness of breath wheezing - If Albuterol rescue inhaler is not helping then switch to Albuterol nebulizer during periods of sickness  - Start loratadine 10mg  daily for 8-12 weeks during spring/fall seasons  - Use incentive spirometer 10 deep breaths/hour 3 times a day   Orders: - Incentive spirometry  - Aerochamber   Follow-up: 6 months with [name] or Eustaquio Maize NP

## 2020-07-18 NOTE — Assessment & Plan Note (Signed)
-   Asthma symptoms currently exacerbated by pollen. Using SABA 2-3 times a week. Her exam is relatively benign today. She is confused regarding when to use her inhalers. Recommend she resume Flovent 110mcg HFA 1 puff twice daily with aerochamber. Reviewed when to use albuterol hfa versus nebulizer. She did not tolerate Singulair. Start loratadine 10mg  daily for 8-12 weeks during spring/fall seasons. Adding incentive spirometer three times a day.

## 2020-08-20 ENCOUNTER — Telehealth: Payer: Self-pay
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
## 2020-08-20 NOTE — Telephone Encounter (Signed)
Please connect with patient Thursday or potentially Friday would be fine based upon openings thank you

## 2020-08-21 NOTE — Telephone Encounter (Signed)
Left message to return call 

## 2020-08-21 NOTE — Telephone Encounter (Signed)
Patient scheduled phone visit with Dr Nicki Reaper 08/22/20 at 4:10pm

## 2020-08-22 ENCOUNTER — Other Ambulatory Visit: Payer: Self-pay

## 2020-08-22 ENCOUNTER — Telehealth (INDEPENDENT_AMBULATORY_CARE_PROVIDER_SITE_OTHER): Payer: Medicare Other | Admitting: Family Medicine

## 2020-08-22 DIAGNOSIS — M546 Pain in thoracic spine: Secondary | ICD-10-CM

## 2020-08-22 DIAGNOSIS — G8929 Other chronic pain: Secondary | ICD-10-CM

## 2020-08-22 MED ORDER — TIZANIDINE HCL 2 MG PO CAPS: ORAL_CAPSULE | ORAL | 0 refills | Status: DC

## 2020-08-22 NOTE — Progress Notes (Signed)
   Subjective:    Patient ID: Jenna Contreras, female    DOB: 30-Jun-1945, 75 y.o.   MRN: [id number] Virtual Visit via Video Note  I connected with Jenna Contreras on 08/22/20 at  4:10 PM EDT by a video enabled telemedicine application and verified that I am speaking with the correct person using two identifiers. Unable to do video visit because of failure of video therefore did telephone visit Location: Patient: Home Provider: Office   I discussed the limitations of evaluation and management by telemedicine and the availability of in person appointments. The patient expressed understanding and agreed to proceed.  History of Present Illness:    Observations/Objective:   Assessment and Plan:   Follow Up Instructions:    I discussed the assessment and treatment plan with the patient. The patient was provided an opportunity to ask questions and all were answered. The patient agreed with the plan and demonstrated an understanding of the instructions.   The patient was advised to call back or seek an in-person evaluation if the symptoms worsen or if the condition fails to improve as anticipated.  I provided 35 minutes of non-face-to-face time during this encounter.   Jenna Min, LPN  HPI  Thoracic Back pain  Referral to [place] home health aide Report of Multiple allergies to current medications kirsteins Review of Systems     Objective:   Physical Exam  Today's visit was via telephone Physical exam was not possible for this visit     Assessment & Plan:   She is having spine pain discomfort along with muscle back Back spasm dropped to 1 side. There are no surgical options for this patient because of her previous surgeries Stretching exercises were recommended May use muscle relaxers infrequently only when at home caution drowsiness  Also it does appear that she would benefit from having a letter home several hours per week she states that be able to  supply this given that her late husband was [place]  Patient also had an unusual allergies to medications including Betadine states that multiple medications potentially have Betadine and so she has stopped taking Claritin, Singulair, Lamictal.  Patient will do a follow-up visit with [place] somewhere in the course of the next couple weeks.  We will go ahead and refer her to Dr. Elnita Maxwell for the possibility of injections.  Continue pain medications through last but patient is to avoid driving when taking pain medicine.

## 2020-08-23 ENCOUNTER — Other Ambulatory Visit: Payer: Self-pay | Admitting: *Deleted

## 2020-08-23 DIAGNOSIS — M549 Dorsalgia, unspecified: Secondary | ICD-10-CM

## 2020-08-26 DIAGNOSIS — M65331 Trigger finger, right middle finger: Secondary | ICD-10-CM | POA: Diagnosis not present

## 2020-08-26 DIAGNOSIS — M1811 Unilateral primary osteoarthritis of first carpometacarpal joint, right hand: Secondary | ICD-10-CM | POA: Diagnosis not present

## 2020-08-26 DIAGNOSIS — M180 Bilateral primary osteoarthritis of first carpometacarpal joints: Secondary | ICD-10-CM | POA: Diagnosis not present

## 2020-08-27 DIAGNOSIS — M1711 Unilateral primary osteoarthritis, right knee: Secondary | ICD-10-CM | POA: Diagnosis not present

## 2020-08-27 DIAGNOSIS — M7061 Trochanteric bursitis, right hip: Secondary | ICD-10-CM | POA: Diagnosis not present

## 2020-08-28 DIAGNOSIS — Z20822 Contact with and (suspected) exposure to covid-19: Secondary | ICD-10-CM | POA: Diagnosis not present

## 2020-09-06 ENCOUNTER — Ambulatory Visit (INDEPENDENT_AMBULATORY_CARE_PROVIDER_SITE_OTHER): Payer: Medicare Other | Admitting: Family Medicine

## 2020-09-06 ENCOUNTER — Encounter: Payer: Self-pay | Admitting: Family Medicine

## 2020-09-06 ENCOUNTER — Other Ambulatory Visit: Payer: Self-pay

## 2020-09-06 VITALS — BP 108/67 | HR 72 | Temp 98.1°F | Ht 62.0 in | Wt 115.0 lb

## 2020-09-06 DIAGNOSIS — L570 Actinic keratosis: Secondary | ICD-10-CM

## 2020-09-06 DIAGNOSIS — M549 Dorsalgia, unspecified: Secondary | ICD-10-CM

## 2020-09-06 MED ORDER — TIZANIDINE HCL 2 MG PO CAPS: ORAL_CAPSULE | ORAL | 0 refills | Status: DC

## 2020-09-06 NOTE — Progress Notes (Signed)
   Subjective:    Patient ID: Jenna Contreras, female    DOB: 14-Aug-1945, 75 y.o.   MRN: [id number]  HPI Thoracic Spine pain follow up  This patient was seen today for chronic pain  The medication list was reviewed and updated.  Location of Pain for which the patient has been treated with regarding narcotics: Pain in the upper back lower back has a rod has previous surgery.  Onset of this pain: Has had this for years   -Compliance with medication: Recently she is actually used a lot less pain medicine which is beneficial  - Number patient states they take daily: Anywhere from 0 to 2 tablets  -when was the last dose patient took?  Several days ago  The patient was advised the importance of maintaining medication and not using illegal substances with these.  Here for refills and follow up  The patient was educated that we can provide 3 monthly scripts for their medication, it is their responsibility to follow the instructions.  Side effects or complications from medications: Denies side effects  Patient is aware that pain medications are meant to minimize the severity of the pain to allow their pain levels to improve to allow for better function. They are aware of that pain medications cannot totally remove their pain.  Due for UDT ( at least once per year) : Later this year  Scale of 1 to 10 ( 1 is least 10 is most) Your pain level without the medicine: Currently pain levels are very good Your pain level with medication currently not taking the pain medicine  Scale 1 to 10 ( 1-helps very little, 10 helps very well) How well does your pain medication reduce your pain so you can function better through out the day?  It does help significantly reduce her pain when she does have the pain  Quality of the pain: Throbbing aching burning  Persistence of the pain: Pain is persistent but recently its been much better using TENS unit  Modifying factors: Worse with  activity  Patient currently right now is trying to stay away from pain medicine and she states she will let us know if she needs a prescription sent in    Pt counseled appt with dr Linwood Dibbles consult at [facility]   Review of Systems     Objective:   Physical Exam  Lungs clear heart regular subjective discomfort throughout the back upper back most tender      Assessment & Plan:  1. Back pain, unspecified back location, unspecified back pain laterality, unspecified chronicity Currently right now she is using very infrequent pain medicine if it starts increasing she will let us know we will hold off on sending additional pain prescriptions currently she will keep [place] updated follow-up in 3 months  She is being followed by specialist trying the TENS unit and she states that her specialist was helping her get in with a special back doctor with [facility]  2. Actinic keratosis She will be seeing a dermatologist with [facility]

## 2020-09-11 ENCOUNTER — Encounter: Payer: Self-pay | Admitting: Physical Medicine & Rehabilitation

## 2020-09-15 NOTE — Progress Notes (Signed)
Please make sure patient is aware of this appointment so that she does not miss it thanks

## 2020-09-16 ENCOUNTER — Telehealth: Payer: Self-pay | Admitting: Family Medicine

## 2020-09-16 NOTE — Progress Notes (Signed)
09/16/20- pt contacted and verbalized understanding.

## 2020-09-16 NOTE — Telephone Encounter (Signed)
FYI: pt wanted provider to know that she sees the dermatologist at [facility] tomorrow for the area of concern of face.

## 2020-09-16 NOTE — Telephone Encounter (Signed)
Good, so noted, thank you

## 2020-09-17 DIAGNOSIS — D229 Melanocytic nevi, unspecified: Secondary | ICD-10-CM | POA: Diagnosis not present

## 2020-09-17 DIAGNOSIS — D219 Benign neoplasm of connective and other soft tissue, unspecified: Secondary | ICD-10-CM | POA: Diagnosis not present

## 2020-09-17 DIAGNOSIS — L570 Actinic keratosis: Secondary | ICD-10-CM | POA: Diagnosis not present

## 2020-09-17 DIAGNOSIS — L738 Other specified follicular disorders: Secondary | ICD-10-CM | POA: Diagnosis not present

## 2020-09-17 DIAGNOSIS — L821 Other seborrheic keratosis: Secondary | ICD-10-CM | POA: Diagnosis not present

## 2020-09-18 ENCOUNTER — Ambulatory Visit (INDEPENDENT_AMBULATORY_CARE_PROVIDER_SITE_OTHER): Payer: Medicare Other | Admitting: Family Medicine

## 2020-09-18 ENCOUNTER — Other Ambulatory Visit: Payer: Self-pay

## 2020-09-18 ENCOUNTER — Other Ambulatory Visit: Payer: Self-pay | Admitting: Family Medicine

## 2020-09-18 ENCOUNTER — Telehealth: Payer: Self-pay | Admitting: Family Medicine

## 2020-09-18 VITALS — BP 131/70 | HR 92 | Temp 97.9°F | Ht 62.0 in | Wt 114.0 lb

## 2020-09-18 DIAGNOSIS — T63464A Toxic effect of venom of wasps, undetermined, initial encounter: Secondary | ICD-10-CM | POA: Diagnosis not present

## 2020-09-18 DIAGNOSIS — Z889 Allergy status to unspecified drugs, medicaments and biological substances status: Secondary | ICD-10-CM

## 2020-09-18 DIAGNOSIS — Z1211 Encounter for screening for malignant neoplasm of colon: Secondary | ICD-10-CM | POA: Diagnosis not present

## 2020-09-18 MED ORDER — METHYLPREDNISOLONE 2 MG PO TABS: ORAL_TABLET | ORAL | 0 refills | Status: DC

## 2020-09-18 NOTE — Telephone Encounter (Signed)
Note added in the referral for the referral coordinator.

## 2020-09-18 NOTE — Telephone Encounter (Signed)
Please notify referrals of the patient's preference with [facility] Patient does not want to utilize [place] thank you (Allergist referral)

## 2020-09-18 NOTE — Progress Notes (Signed)
   Subjective:    Patient ID: Jenna Contreras, female    DOB: 03/07/1946, 75 y.o.   MRN: [id number]  HPI Bee sting on left hand 2 days ago. Some intermittent swelling  Would like a referral for colonoscopy. States she use to see Dr. Oneida Alar and is 4 years past due. Father had colon cancer.    Review of Systems     Objective:   Physical Exam  Swollen left hand due to bug bite insect sting no sign of cellulitis no sign of infection  No warning signs noted warnings were discussed    Assessment & Plan:  Hand staying with yellow jacket patient with multiple allergies to numerous medicines she also states probed and iodine is in multiple medicines therefore she cannot take medicine such as Claritin Benadryl etc. we agreed to try methylprednisolone  It would be in this patient's best interest to see allergist to help weed through which medications are a definite [place] in which medications are okay to take We will go ahead with referral for colonoscopy

## 2020-09-18 NOTE — Telephone Encounter (Signed)
Would like to go to [facility] in [facility] for her referral

## 2020-09-19 NOTE — Progress Notes (Signed)
Referral ordered in EPIC. 

## 2020-09-19 NOTE — Addendum Note (Signed)
Addended by: Dairl Ponder on: 09/19/2020 09:02 AM   Modules accepted: Orders

## 2020-09-23 ENCOUNTER — Ambulatory Visit: Payer: Medicare Other | Admitting: Family Medicine

## 2020-09-24 ENCOUNTER — Encounter: Payer: Self-pay | Admitting: Internal Medicine

## 2020-10-07 MED ORDER — TIZANIDINE HCL 2 MG PO TABS: ORAL_TABLET | ORAL | 0 refills | Status: DC

## 2020-10-17 ENCOUNTER — Telehealth: Payer: Self-pay | Admitting: *Deleted

## 2020-10-17 MED ORDER — LEVOTHYROXINE SODIUM 75 MCG PO TABS: ORAL_TABLET | ORAL | 1 refills | Status: DC

## 2020-10-17 NOTE — Telephone Encounter (Signed)
Patient advised per Dr Nicki Reaper: She should stay self isolated for the next couple days then retest again on day 3 of symptoms.(Obviously if she is a whole lot worse tomorrow to call us back for further advice) If the repeat test is negative then the likelihood of COVID is very very small, if that test is negative and she is feeling better she can start being around other people  if that test is positive she should touch base again   Likelihood of severe COVID is very very low.  Warning signs to watch for significant shortness of breath difficulty breathing.  In those situations we recommend urgent care or ER  or here if mild symptoms   May call us tomorrow if significantly worse   Patient verbalized understanding. Prescription sent electronically to pharmacy.

## 2020-10-17 NOTE — Telephone Encounter (Signed)
Patient found out last night she was exposed to someone with Covid Sunday. Patient stated after finding out she started with slight cough and this am woke up with some congestion and headache- took a rapid Covid test and it was negative but she is concerned about what she should do. NO SOB- Patient also needs refill of Synthroid because she let herself run out - [facility]

## 2020-10-17 NOTE — Telephone Encounter (Signed)
She should stay self isolated for the next couple days then retest again on day 3 of symptoms.(Obviously if she is a whole lot worse tomorrow to call us back for further advice) If the repeat test is negative then the likelihood of COVID is very very small, if that test is negative and she is feeling better she can start being around other people  if that test is positive she should touch base again  Likelihood of severe COVID is very very low.  Warning signs to watch for significant shortness of breath difficulty breathing.  In those situations we recommend urgent care or ER  or here if mild symptoms  May call us tomorrow if significantly worse  May have refill of her thyroid medicine 3 months

## 2020-10-28 DIAGNOSIS — Z124 Encounter for screening for malignant neoplasm of cervix: Secondary | ICD-10-CM | POA: Diagnosis not present

## 2020-10-28 DIAGNOSIS — Z01411 Encounter for gynecological examination (general) (routine) with abnormal findings: Secondary | ICD-10-CM | POA: Diagnosis not present

## 2020-10-28 DIAGNOSIS — Z01419 Encounter for gynecological examination (general) (routine) without abnormal findings: Secondary | ICD-10-CM | POA: Diagnosis not present

## 2020-10-28 DIAGNOSIS — Z6821 Body mass index (BMI) 21.0-21.9, adult: Secondary | ICD-10-CM | POA: Diagnosis not present

## 2020-10-29 ENCOUNTER — Encounter: Payer: Self-pay | Admitting: Physical Medicine & Rehabilitation

## 2020-10-29 ENCOUNTER — Encounter: Payer: Medicare Other | Attending: Physical Medicine & Rehabilitation | Admitting: Physical Medicine & Rehabilitation

## 2020-10-29 ENCOUNTER — Other Ambulatory Visit: Payer: Self-pay

## 2020-10-29 VITALS — BP 132/73 | HR 77 | Temp 98.2°F | Ht 61.0 in | Wt 115.0 lb

## 2020-10-29 DIAGNOSIS — M7918 Myalgia, other site: Secondary | ICD-10-CM

## 2020-10-29 MED ORDER — HYDROXYZINE PAMOATE 25 MG PO CAPS: 25.0000 mg | ORAL_CAPSULE | Freq: Three times a day (TID) | ORAL | 1 refills | Status: DC | PRN

## 2020-10-29 NOTE — Progress Notes (Signed)
Subjective:    Patient ID: Jenna Contreras, female    DOB: 1945-08-20, 75 y.o.   MRN: [id number]  HPI  CC:  Back pain  75 yo female with hx of muscle contraction as main complaint.  She has a history of thoracolumbar scoliosis and underwent an extensive T4 through iliac fusion performed in 2016 by Dr. Jeannine Kitten. Overall the patient has benefited from the surgery she could not drive in the car at 1 point.  The patient has complaints of episodic severe muscle spasm in the upper back and neck area.  Review of cervical spine MRI performed at [facility] in 2020 demonstrated significant left cervical facet arthropathy multilevel C4-C7 while there was some foraminal stenosis there was no central canal stenosis.  Had 3 episode  of muscle  End of February 2022- above the thoracic incision March 2022 - seen by Dr Gladstone Lighter, [facility] who prescribed muscle relaxers which helped after 2 days April 2022- took muscle relaxers for ~ 1wk  Unable to use betadine, pt states , Povidone additives to many medications also menses. Did well after vistaril injection in the past   The patient is independent with all self-care and mobility.  During her 3 to 4 months she states that she was not able to perform ADLs however.  She is currently not using any walker but does use a cane on disability since 2004. Review of systems significant for anxiety depression.  Used to see a Social worker but none currently.  PHQ-9 score is elevated at 19.  She has taken  Her husband it for long period of time before he died about 1 year ago   Pain Inventory Average Pain 10 Pain Right Now 2 My pain is constant  In the last 24 hours, has pain interfered with the following? General activity 0 Relation with others 10 Enjoyment of life 8 What TIME of day is your pain at its worst? morning , daytime, evening, and night Sleep (in general) Poor  Pain is worse with: walking, bending, sitting, inactivity, and standing Pain  improves with: medication Relief from Meds: 1  walk with assistance use a cane use a walker do you drive?  yes  disabled: date disabled 2004  spasms dizziness confusion depression anxiety  new  new    Family History  Problem Relation Age of Onset   Heart disease Mother    Diabetes Mother    Colon cancer Father 29   Heart disease Brother    Social History   Socioeconomic History   Marital status: Married    Spouse name: Not on file   Number of children: Not on file   Years of education: Not on file   Highest education level: Not on file  Occupational History   Occupation: Social worker and retired Marine scientist  Tobacco Use   Smoking status: Former    Packs/day: 2.00    Years: 19.00    Pack years: 38.00    Types: Cigarettes    Quit date: 03/10/1979    Years since quitting: 41.6   Smokeless tobacco: Never   Tobacco comments:    Quit in 1981  Vaping Use   Vaping Use: Never used  Substance and Sexual Activity   Alcohol use: No    Alcohol/week: 0.0 standard drinks    Comment: no   Drug use: No   Sexual activity: Not Currently    Birth control/protection: Post-menopausal  Other Topics Concern   Not on file  Social History Narrative  Not on file   Social Determinants of Health   Financial Resource Strain: Not on file  Food Insecurity: Not on file  Transportation Needs: Not on file  Physical Activity: Not on file  Stress: Not on file  Social Connections: Not on file   Past Surgical History:  Procedure Laterality Date   Arthroscopic left knee surgery     BIOPSY  10/28/2017   Procedure: BIOPSY;  Surgeon: Danie Binder, MD;  [facility];  Service: Endoscopy;;  duodenal biopsy , gastric biopsy    DILATION AND CURETTAGE OF UTERUS  x3   ESOPHAGOGASTRODUODENOSCOPY (EGD) WITH PROPOFOL N/A 10/28/2017   Procedure: ESOPHAGOGASTRODUODENOSCOPY (EGD) WITH PROPOFOL;  Surgeon: Danie Binder, MD;  [facility];  Service: Endoscopy;  Laterality: N/A;   8:15am   HIP SURGERY Right 2015   iliac wings     KNEE SURGERY Bilateral 2015, 2016   MANDIBLE SURGERY     X 2   PARTIAL KNEE ARTHROPLASTY Left 02/12/2014   Procedure: LEFT KNEE UNICOMPARTMENTAL MEDIALLY ARTHROPLASTY ;  Surgeon: Mauri Pole, MD;  Location: [facility];  Service: Orthopedics;  Laterality: Left;   PARTIAL KNEE ARTHROPLASTY Right 07/02/2014   Procedure: RIGHT UNI KNEE ARTHOPLASTY MEDIALLY;  Surgeon: Paralee Cancel, MD;  Location: [facility];  Service: Orthopedics;  Laterality: Right;   rod in spine     scoliosis    TONSILLECTOMY     TOTAL HIP ARTHROPLASTY Right 05/09/2013   Procedure: RIGHT TOTAL HIP ARTHROPLASTY ANTERIOR APPROACH;  Surgeon: Mauri Pole, MD;  Location: [facility];  Service: Orthopedics;  Laterality: Right;   TUBAL LIGATION     WRIST SURGERY     Past Medical History:  Diagnosis Date   Anxiety    Arthritis    Arthritis    Asthma    Bipolar 2 disorder ([place])    Cancer (HCC)    Basal Cell   Constipation    Depression    Fibromyalgia    GERD (gastroesophageal reflux disease)    Heart murmur    As small child    History of gout    History of skin cancer    HNP (herniated nucleus pulposus with myelopathy), thoracic    Hypothyroidism    Left eye injury    In ED 06/17/2014    Low BP    Migraines    OCD (obsessive compulsive disorder)    Osteoporosis    Psoriasis    Scoliosis    BP 132/73   Pulse 77   Temp 98.2 F (36.8 C)   Ht '5\' 1"'$  (1.549 m)   Wt 115 lb (52.2 kg)   SpO2 98%   BMI 21.73 kg/m   Opioid Risk Score:   Fall Risk Score:  `1  Depression screen PHQ 2/9  Depression screen [facility] 2/9 10/29/2020 09/06/2020 08/22/2020 08/22/2020 06/24/2020 03/18/2020  Decreased Interest 3 0 0 0 0 3  Down, Depressed, Hopeless 3 0 0 0 0 2  PHQ - 2 Score 6 0 0 0 0 5  Altered sleeping 3 - - - - 3  Tired, decreased energy 3 - - - - 3  Change in appetite 3 - - - - 3  Feeling bad or failure about yourself  3 - - - - 1  Trouble concentrating 1 - - - - 3  Moving slowly  or fidgety/restless 0 - - - - 0  Suicidal thoughts 0 - - - - 0  PHQ-9 Score 19 - - - -  18  Difficult doing work/chores - - - - - Extremely dIfficult  Some recent data might be hidden    Review of Systems  Constitutional: Negative.   HENT: Negative.    Eyes: Negative.   Respiratory: Negative.    Cardiovascular: Negative.   Gastrointestinal: Negative.   Endocrine: Negative.   Genitourinary: Negative.   Musculoskeletal:  Positive for back pain and neck pain.       Spasms  Skin: Negative.   Allergic/Immunologic: Negative.   Neurological: Negative.   Hematological: Negative.   Psychiatric/Behavioral:  Positive for confusion and dysphoric mood. The patient is nervous/anxious.   All other systems reviewed and are negative.     Objective:   Physical Exam Vitals and nursing note reviewed.  Constitutional:      Appearance: She is normal weight.  HENT:     Head: Normocephalic and atraumatic.  Eyes:     Extraocular Movements: Extraocular movements intact.     Conjunctiva/sclera: Conjunctivae normal.     Pupils: Pupils are equal, round, and reactive to light.  Cardiovascular:     Rate and Rhythm: Regular rhythm. Tachycardia present.     Heart sounds: Normal heart sounds.  Pulmonary:     Effort: Pulmonary effort is normal.     Breath sounds: Normal breath sounds.  Abdominal:     General: Abdomen is flat. Bowel sounds are normal.     Palpations: Abdomen is soft.  Musculoskeletal:        General: No swelling or tenderness.  Skin:    General: Skin is warm and dry.  Neurological:     General: No focal deficit present.     Mental Status: She is alert and oriented to person, place, and time.     Deep Tendon Reflexes:     Reflex Scores:      Tricep reflexes are 2+ on the right side and 2+ on the left side.      Bicep reflexes are 2+ on the right side and 2+ on the left side.      Brachioradialis reflexes are 2+ on the right side and 2+ on the left side.      Patellar reflexes are  2+ on the right side and 2+ on the left side.      Achilles reflexes are 2+ on the right side and 2+ on the left side. Psychiatric:        Mood and Affect: Mood normal.        Behavior: Behavior normal.   Motor strength is 5/5 bilateral deltoid, bicep, tricep, grip, hip flexor, knee extensor, ankle dorsiflexion plantarflexion Negative straight leg raising test bilaterally Sensation normal to pinprick in bilateral lower extremities as well as upper extremities. Ambulates with a cane no evidence of toe drag or knee instability There is no tenderness palpation at the upper trapezius through the upper medial scapular border although she states that during her flareups these are the areas that typically hurt her. There is no evidence of torticollis. Deep tendon reflexes are normal bilaterally       Assessment & Plan:  1.  Episodic muscular pain above the most rostral area of her thoracolumbar fusion.  She is fused from T4 through the iliac crests.  There is no evidence of cervical dystonia. We discussed symptomatic treatment during these flareups which include use of Biofreeze or some other counter irritant cream.  She has to be careful about her allergies which are numerous. We also discussed that she can use heat  as well as her TENS unit as long as TENS unit is not used directly over surgical site. In addition she has historically had good relief with hydroxyzine during these episodes I have written a prescription for 30 tablets of hydroxyzine 25 mg she can take 1 to 2 tablets 3 times a day as needed I have sent her to physical therapy to get instruction on stretching exercises that she can do on a regular basis to hopefully prevent some of these episodes. I will see the patient back in 2 months We also spoke that some of her left-sided neck pain is likely related to her cervical facet arthropathy.  She may benefit from medial branch blocks as well as radiofrequency of that side if that becomes  more problematic would refer to Dr. Nelva Bush

## 2020-10-29 NOTE — Patient Instructions (Signed)
[facility] TENS unit on [employer]

## 2020-11-08 DIAGNOSIS — M62830 Muscle spasm of back: Secondary | ICD-10-CM | POA: Diagnosis not present

## 2020-11-08 DIAGNOSIS — G8929 Other chronic pain: Secondary | ICD-10-CM | POA: Diagnosis not present

## 2020-11-08 DIAGNOSIS — Z981 Arthrodesis status: Secondary | ICD-10-CM | POA: Diagnosis not present

## 2020-11-08 DIAGNOSIS — M542 Cervicalgia: Secondary | ICD-10-CM | POA: Diagnosis not present

## 2020-11-08 DIAGNOSIS — M41125 Adolescent idiopathic scoliosis, thoracolumbar region: Secondary | ICD-10-CM | POA: Diagnosis not present

## 2020-11-12 DIAGNOSIS — Z20822 Contact with and (suspected) exposure to covid-19: Secondary | ICD-10-CM | POA: Diagnosis not present

## 2020-11-13 DIAGNOSIS — R350 Frequency of micturition: Secondary | ICD-10-CM | POA: Diagnosis not present

## 2020-11-13 DIAGNOSIS — N390 Urinary tract infection, site not specified: Secondary | ICD-10-CM | POA: Diagnosis not present

## 2020-11-19 DIAGNOSIS — M7061 Trochanteric bursitis, right hip: Secondary | ICD-10-CM | POA: Diagnosis not present

## 2020-11-19 DIAGNOSIS — M1711 Unilateral primary osteoarthritis, right knee: Secondary | ICD-10-CM | POA: Diagnosis not present

## 2020-11-22 ENCOUNTER — Encounter (HOSPITAL_COMMUNITY): Payer: Self-pay

## 2020-11-22 ENCOUNTER — Other Ambulatory Visit: Payer: Self-pay

## 2020-11-22 ENCOUNTER — Ambulatory Visit (HOSPITAL_COMMUNITY): Payer: Medicare Other | Attending: Physical Medicine & Rehabilitation

## 2020-11-22 DIAGNOSIS — M6281 Muscle weakness (generalized): Secondary | ICD-10-CM | POA: Diagnosis not present

## 2020-11-22 DIAGNOSIS — R2681 Unsteadiness on feet: Secondary | ICD-10-CM | POA: Insufficient documentation

## 2020-11-22 DIAGNOSIS — R29898 Other symptoms and signs involving the musculoskeletal system: Secondary | ICD-10-CM | POA: Diagnosis not present

## 2020-11-22 DIAGNOSIS — M7918 Myalgia, other site: Secondary | ICD-10-CM | POA: Diagnosis not present

## 2020-11-22 NOTE — Therapy (Signed)
[facility] [place], [place], [address] Phone: [phone]   Fax:  [phone]  Physical Therapy Evaluation  Patient Details  Name: Jenna Contreras MRN: [id number] Date of Birth: 1945-08-24 Referring Provider (PT): Charlett Blake, MD   Encounter Date: 11/22/2020   PT End of Session - 11/22/20 1032     Visit Number 1    Number of Visits 8    Date for PT Re-Evaluation 12/20/20    Authorization Type Medicare Part A and B    PT Start Time 1031    PT Stop Time 1115    PT Time Calculation (min) 44 min    Activity Tolerance Patient limited by pain    Behavior During Therapy [facility] for tasks assessed/performed             Past Medical History:  Diagnosis Date   Anxiety    Arthritis    Arthritis    Asthma    Bipolar 2 disorder ([place])    Cancer ([place])    Basal Cell   Constipation    Depression    Fibromyalgia    GERD (gastroesophageal reflux disease)    Heart murmur    As small child    History of gout    History of skin cancer    HNP (herniated nucleus pulposus with myelopathy), thoracic    Hypothyroidism    Left eye injury    In ED 06/17/2014    Low BP    Migraines    OCD (obsessive compulsive disorder)    Osteoporosis    Psoriasis    Scoliosis     Past Surgical History:  Procedure Laterality Date   Arthroscopic left knee surgery     BIOPSY  10/28/2017   Procedure: BIOPSY;  Surgeon: Danie Binder, MD;  [facility];  Service: Endoscopy;;  duodenal biopsy , gastric biopsy    DILATION AND CURETTAGE OF UTERUS  x3   ESOPHAGOGASTRODUODENOSCOPY (EGD) WITH PROPOFOL N/A 10/28/2017   Procedure: ESOPHAGOGASTRODUODENOSCOPY (EGD) WITH PROPOFOL;  Surgeon: Danie Binder, MD;  [facility];  Service: Endoscopy;  Laterality: N/A;  8:15am   HIP SURGERY Right 2015   iliac wings     KNEE SURGERY Bilateral 2015, 2016   MANDIBLE SURGERY     X 2   PARTIAL KNEE ARTHROPLASTY Left 02/12/2014    Procedure: LEFT KNEE UNICOMPARTMENTAL MEDIALLY ARTHROPLASTY ;  Surgeon: Mauri Pole, MD;  [facility];  Service: Orthopedics;  Laterality: Left;   PARTIAL KNEE ARTHROPLASTY Right 07/02/2014   Procedure: RIGHT UNI KNEE ARTHOPLASTY MEDIALLY;  Surgeon: Paralee Cancel, MD;  Location: [facility];  Service: Orthopedics;  Laterality: Right;   rod in spine     scoliosis    TONSILLECTOMY     TOTAL HIP ARTHROPLASTY Right 05/09/2013   Procedure: RIGHT TOTAL HIP ARTHROPLASTY ANTERIOR APPROACH;  Surgeon: Mauri Pole, MD;  [facility];  Service: Orthopedics;  Laterality: Right;   TUBAL LIGATION     WRIST SURGERY      There were no vitals filed for this visit.    Subjective Assessment - 11/22/20 1036     Subjective Pt had surgery of T4-iliac crest fusion for scoliosis in August 2016. This past winter pt began to experience upper back and neck pains/spasms which cause crippling pain. Pt has these episodes of intense spasm/pain that originate from midthoracic and extend to cervical column and back of head. Pt notes chronic neck pain  and difficulty with rotation of her head due to neck pain    Pertinent History RA, OA, hx of scoliosis surgery/ extensive fusion T4-iliac crests    Patient Stated Goals "Reduce these spasms and decrease pain so I can take care of my house"    Currently in Pain? Yes    Pain Score 3    baseline pain, reports 10/10 pain when she has these attacks   Pain Location Thoracic    Pain Orientation Mid    Pain Descriptors / Indicators Burning                [facility] PT Assessment - 11/22/20 0001       Assessment   Medical Diagnosis Myofascial pain syndrome of cervical/thoracic    Referring Provider (PT) Kirsteins, Luanna Salk, MD      Balance Screen   Has the patient fallen in the past 6 months No    Has the patient had a decrease in activity level because of a fear of falling?  No    Is the patient reluctant to leave their home because of a fear of falling?  No       Home Environment   Living Environment Private residence    Living Arrangements Alone    Type of [place] to enter    Entrance Stairs-Number of Steps 1    [place] One level      Prior Function   Level of [place] Retired      ROM / Strength   AROM / PROM / Strength AROM;Strength      AROM   AROM Assessment Site Cervical    Cervical Flexion WNL    Cervical Extension WNL    Cervical - Right Side Bend 50% limited    Cervical - Left Side Bend 50% limited    Cervical - Right Rotation 25% limited    Cervical - Left Rotation 75% limited      Strength   Overall Strength Deficits    Overall Strength Comments 3/5 gross BUE strength and scapular strength      Palpation   Palpation comment upper trap hypertrophy and tenderness to palpation. Left scapula with winging but this seems partly due to spinal deformity. Increased pain with overhead movements .      Balance   Balance Assessed Yes      Static Standing Balance   Static Standing - Balance Support Bilateral upper extremity supported    Static Standing - Level of Assistance 4: Min assist    Static Standing Balance -  Activities  Single Leg Stance - Right Leg;Single Leg Stance - Left Leg;Tandam Stance - Right Leg;Tandam Stance - Left Leg   5-10 sec SLS CGA, 5-10 sec tandem stance CGA and BUE support                       Objective measurements completed on examination: See above findings.                PT Education - 11/22/20 1149     Education Details education/discussion regarding benefits of therapeutic activities/exercises    Person(s) Educated Patient    Methods Explanation    Comprehension Verbalized understanding              PT Short Term Goals - 11/22/20 1138       PT SHORT TERM GOAL #1   Title Patient  will be independent with HEP, updated PRN, to improve strength and functional mobility as well as reduce pain.    Time 2     Period Weeks    Status New    Target Date 12/06/20      PT SHORT TERM GOAL #2   Title Patient will report at least 25% improvement in symptoms for improved quality of life.    Baseline episodes of spasms and rates pain in back/arms 8/10 with overhead movements    Time 2    Period Weeks    Status New    Target Date 12/06/20      PT SHORT TERM GOAL #3   Title Demonstrate improved balance as evidenced by 10 sec single leg stance right/left without UE support    Baseline 5-10 sec with BUE support    Time 2    Period Weeks    Status New    Target Date 12/06/20               PT Long Term Goals - 11/22/20 1143       PT LONG TERM GOAL #1   Title Demonstrate improved left cervical rotation to 25% restriction to improve comfort and safety when driving    Baseline [id number] limitation    Time 4    Period Weeks    Status New    Target Date 12/20/20      PT LONG TERM GOAL #2   Title Demonstrate 4/5 BUE strength to improve function for overhead activities    Baseline 3/5 gross strength, 8/10 pain with overhead movements    Time 4    Period Weeks    Status New    Target Date 12/20/20      PT LONG TERM GOAL #3   Title Demonstrate tandem stance x 30 sec without LOB to manifest improved static standing balance    Baseline 5-10 sec with BUE support    Time 4    Period Weeks    Status New    Target Date 12/20/20      PT LONG TERM GOAL #4   Title Pt will report no more than 3/10 pain in her upper back throughout the day, to improve general activity tolerance.     Baseline 3-10/10    Time 4    Period Weeks    Status New    Target Date 12/20/20                    Plan - 11/22/20 1133     Clinical Impression Statement Patient is a  47 presenting to physical therapy with c/o pain, spasms, generalized weakness. She presents with pain limited deficits in UE and scapular strength, ROM, endurance, postural impairments, spinal mobility and functional mobility with ADL. She is  having to modify and restrict ADL as indicated by balance capabilities as well as subjective information and objective measures which is affecting overall participation. Patient will benefit from skilled physical therapy in order to improve function and reduce impairment.    Personal Factors and Comorbidities Age;Time since onset of injury/illness/exacerbation;Past/Current Experience    Examination-Activity Limitations Bend;Carry;Lift;Reach Overhead;Locomotion Level;Transfers    Examination-Participation Restrictions Cleaning;Community Activity;Yard Work    [employer]chant [employer] officer Evolving/Moderate complexity    Clinical Decision Making Moderate    Rehab Potential Fair    PT Frequency 2x / week    PT Duration 4 weeks    PT Treatment/Interventions ADLs/Self Care Home Management;Electrical Stimulation;Gait training;Functional mobility training;Therapeutic activities;Therapeutic  exercise;Balance training;Neuromuscular re-education;Manual techniques;Taping;Splinting;Dry needling    PT Next Visit Plan cervical spine rotation SNAG, scapular strengthening, STM to traps?    PT Home Exercise Plan unable to initiate at time of evaluation    Consulted and Agree with Plan of Care Patient             Patient will benefit from skilled therapeutic intervention in order to improve the following deficits and impairments:  Decreased activity tolerance, Decreased balance, Decreased endurance, Decreased range of motion, Decreased strength, Hypomobility, Increased fascial restricitons, Increased muscle spasms, Impaired perceived functional ability, Postural dysfunction, Pain  Visit Diagnosis: Myofascial pain syndrome of thoracic spine  Muscle weakness (generalized)  Other symptoms and signs involving the musculoskeletal system  Unsteadiness on feet     Problem List Patient Active Problem List   Diagnosis Date Noted   Coronary artery disease involving native heart without angina pectoris  01/31/2020   Grief reaction 01/22/2020   Upper respiratory tract infection 05/30/2018   Dyspepsia    FH: colon cancer 09/18/2015   Osteoporosis 05/20/2015   Moderate single current episode of major depressive disorder ([place]) 02/18/2015   Status post unilateral knee replacement 07/02/2014   Lung nodule 02/06/2014   Hyperlipidemia 01/18/2014   S/P right THA, AA 05/09/2013   Endometrial polyp 02/28/2013   Osteoarthritis of right hip 02/15/2013   Chronic back pain 08/17/2012   Hypothyroid 08/17/2012   Osteopenia 08/17/2012   Hypersomnia 11/18/2011   Asthma, mild persistent 08/05/2011   TIA (transient ischemic attack) 08/05/2011   Pulmonary nodule 08/05/2011   SOB (shortness of breath) 01/05/2011   11:58 AM, 11/22/20 M. Sherlyn Lees, PT, DPT Physical Therapist- [place] Office Number: [phone]   [facility] [address] [place], [place], [address] Phone: [phone]   Fax:  [phone]  Name: Kiannah Menth MRN: [id number] Date of Birth: 01/01/1946

## 2020-11-23 ENCOUNTER — Other Ambulatory Visit: Payer: Self-pay

## 2020-11-23 ENCOUNTER — Emergency Department (HOSPITAL_COMMUNITY)
Admission: EM | Admit: 2020-11-23 | Discharge: 2020-11-24 | Disposition: A | Discharge status: Home / Self Care | Payer: Medicare Other | Attending: Emergency Medicine | Admitting: Emergency Medicine

## 2020-11-23 ENCOUNTER — Encounter (HOSPITAL_COMMUNITY): Payer: Self-pay | Admitting: Emergency Medicine

## 2020-11-23 DIAGNOSIS — Z85828 Personal history of other malignant neoplasm of skin: Secondary | ICD-10-CM | POA: Insufficient documentation

## 2020-11-23 DIAGNOSIS — I2510 Atherosclerotic heart disease of native coronary artery without angina pectoris: Secondary | ICD-10-CM | POA: Diagnosis not present

## 2020-11-23 DIAGNOSIS — R109 Unspecified abdominal pain: Secondary | ICD-10-CM | POA: Insufficient documentation

## 2020-11-23 DIAGNOSIS — R300 Dysuria: Secondary | ICD-10-CM | POA: Insufficient documentation

## 2020-11-23 DIAGNOSIS — E039 Hypothyroidism, unspecified: Secondary | ICD-10-CM | POA: Diagnosis not present

## 2020-11-23 DIAGNOSIS — J4530 Mild persistent asthma, uncomplicated: Secondary | ICD-10-CM | POA: Diagnosis not present

## 2020-11-23 DIAGNOSIS — N390 Urinary tract infection, site not specified: Secondary | ICD-10-CM | POA: Insufficient documentation

## 2020-11-23 DIAGNOSIS — R5383 Other fatigue: Secondary | ICD-10-CM | POA: Insufficient documentation

## 2020-11-23 DIAGNOSIS — Z79899 Other long term (current) drug therapy: Secondary | ICD-10-CM | POA: Insufficient documentation

## 2020-11-23 DIAGNOSIS — Z96641 Presence of right artificial hip joint: Secondary | ICD-10-CM | POA: Diagnosis not present

## 2020-11-23 DIAGNOSIS — R399 Unspecified symptoms and signs involving the genitourinary system: Secondary | ICD-10-CM | POA: Diagnosis not present

## 2020-11-23 DIAGNOSIS — R5381 Other malaise: Secondary | ICD-10-CM | POA: Diagnosis not present

## 2020-11-23 DIAGNOSIS — Z87440 Personal history of urinary (tract) infections: Secondary | ICD-10-CM | POA: Diagnosis not present

## 2020-11-23 DIAGNOSIS — Z87891 Personal history of nicotine dependence: Secondary | ICD-10-CM | POA: Insufficient documentation

## 2020-11-23 DIAGNOSIS — Z91040 Latex allergy status: Secondary | ICD-10-CM | POA: Insufficient documentation

## 2020-11-23 DIAGNOSIS — Z96653 Presence of artificial knee joint, bilateral: Secondary | ICD-10-CM | POA: Insufficient documentation

## 2020-11-23 DIAGNOSIS — Z7951 Long term (current) use of inhaled steroids: Secondary | ICD-10-CM | POA: Diagnosis not present

## 2020-11-23 DIAGNOSIS — R350 Frequency of micturition: Secondary | ICD-10-CM | POA: Diagnosis not present

## 2020-11-23 NOTE — ED Triage Notes (Signed)
Pt sent by an UC for IV antibiotics. States she was diagnosed with UTI, completed abx, still c/o urinary frequency and pain with urination.

## 2020-11-24 DIAGNOSIS — R5383 Other fatigue: Secondary | ICD-10-CM | POA: Diagnosis not present

## 2020-11-24 DIAGNOSIS — R350 Frequency of micturition: Secondary | ICD-10-CM | POA: Diagnosis not present

## 2020-11-24 LAB — COMPREHENSIVE METABOLIC PANEL
ALT: 16 U/L (ref 0–44)
AST: 17 U/L (ref 15–41)
Albumin: 3.7 g/dL (ref 3.5–5.0)
Alkaline Phosphatase: 57 U/L (ref 38–126)
Anion gap: 11 (ref 5–15)
BUN: 15 mg/dL (ref 8–23)
CO2: 24 mmol/L (ref 22–32)
Calcium: 9.5 mg/dL (ref 8.9–10.3)
Chloride: 104 mmol/L (ref 98–111)
Creatinine, Ser: 0.71 mg/dL (ref 0.44–1.00)
GFR, Estimated: >60 mL/min (ref >60)
Glucose, Bld: 93 mg/dL (ref 70–99)
Potassium: 3.5 mmol/L (ref 3.5–5.1)
Sodium: 139 mmol/L (ref 135–145)
Total Bilirubin: 1 mg/dL (ref 0.3–1.2)
Total Protein: 6.6 g/dL (ref 6.5–8.1)

## 2020-11-24 LAB — CBC WITH DIFFERENTIAL/PLATELET
Abs Immature Granulocytes: 0.01 10*3/uL (ref 0.00–0.07)
Basophils Absolute: 0.1 10*3/uL (ref 0.0–0.1)
Basophils Relative: 1 %
Eosinophils Absolute: 0.1 10*3/uL (ref 0.0–0.5)
Eosinophils Relative: 1 %
HCT: 39 % (ref 36.0–46.0)
Hemoglobin: 12.8 g/dL (ref 12.0–15.0)
Immature Granulocytes: 0 %
Lymphocytes Relative: 31 %
Lymphs Abs: 2.6 10*3/uL (ref 0.7–4.0)
MCH: 29.9 pg (ref 26.0–34.0)
MCHC: 32.8 g/dL (ref 30.0–36.0)
MCV: 91.1 fL (ref 80.0–100.0)
Monocytes Absolute: 0.7 10*3/uL (ref 0.1–1.0)
Monocytes Relative: 8 %
Neutro Abs: 4.8 10*3/uL (ref 1.7–7.7)
Neutrophils Relative %: 59 %
Platelets: 294 10*3/uL (ref 150–400)
RBC: 4.28 MIL/uL (ref 3.87–5.11)
RDW: 13.6 % (ref 11.5–15.5)
WBC: 8.1 10*3/uL (ref 4.0–10.5)
nRBC: 0 % (ref 0.0–0.2)

## 2020-11-24 LAB — URINALYSIS, ROUTINE W REFLEX MICROSCOPIC
Bilirubin Urine: NEGATIVE
Glucose, UA: NEGATIVE mg/dL
Hgb urine dipstick: NEGATIVE
Ketones, ur: 5 mg/dL — AB
Leukocytes,Ua: NEGATIVE
Nitrite: NEGATIVE
Protein, ur: NEGATIVE mg/dL
Specific Gravity, Urine: 1.025 (ref 1.005–1.030)
pH: 5 (ref 5.0–8.0)

## 2020-11-24 MED ORDER — FOSFOMYCIN TROMETHAMINE 3 G PO PACK
3.0000 g | PACK | Freq: Once | ORAL | Status: AC
  Administered 2020-11-24: 3 g via ORAL
  Filled 2020-11-24: qty 3

## 2020-11-24 NOTE — ED Provider Notes (Signed)
[facility] Provider Note   CSN: [id number] Arrival date & time: 11/23/20  2012     History Chief Complaint  Patient presents with   Urinary Tract Infection    Jenna Contreras is a 75 y.o. female.  Jenna Contreras was treated at an urgent care for a urinary tract infection.  She presented on November 13, 2020.  Urine culture returned sensitive to the antibiotic she was prescribed, Macrobid.  However, she is still endorsing frequency and dysuria.  She went back to urgent care today, and she was told that she needed to come to the hospital for IV antibiotics.  The history is provided by the patient.  Urinary Tract Infection Pain quality:  Burning Pain severity:  Mild Onset quality:  Gradual Duration:  2 weeks Timing:  Constant Progression:  Unchanged Chronicity:  New Recent urinary tract infections: no   Relieved by:  Nothing Worsened by:  Nothing Ineffective treatments: macrobid. Urinary symptoms: frequent urination   Associated symptoms: abdominal pain (suprapubic)   Associated symptoms: no fever, no flank pain, no nausea and no vomiting       Past Medical History:  Diagnosis Date   Anxiety    Arthritis    Arthritis    Asthma    Bipolar 2 disorder (HCC)    Cancer ([place])    Basal Cell   Constipation    Depression    Fibromyalgia    GERD (gastroesophageal reflux disease)    Heart murmur    As small child    History of gout    History of skin cancer    HNP (herniated nucleus pulposus with myelopathy), thoracic    Hypothyroidism    Left eye injury    In ED 06/17/2014    Low BP    Migraines    OCD (obsessive compulsive disorder)    Osteoporosis    Psoriasis    Scoliosis     Patient Active Problem List   Diagnosis Date Noted   Coronary artery disease involving native heart without angina pectoris 01/31/2020   Grief reaction 01/22/2020   Upper respiratory tract infection 05/30/2018   Dyspepsia    FH:  colon cancer 09/18/2015   Osteoporosis 05/20/2015   Moderate single current episode of major depressive disorder ([place]) 02/18/2015   Status post unilateral knee replacement 07/02/2014   Lung nodule 02/06/2014   Hyperlipidemia 01/18/2014   S/P right THA, AA 05/09/2013   Endometrial polyp 02/28/2013   Osteoarthritis of right hip 02/15/2013   Chronic back pain 08/17/2012   Hypothyroid 08/17/2012   Osteopenia 08/17/2012   Hypersomnia 11/18/2011   Asthma, mild persistent 08/05/2011   TIA (transient ischemic attack) 08/05/2011   Pulmonary nodule 08/05/2011   SOB (shortness of breath) 01/05/2011    Past Surgical History:  Procedure Laterality Date   Arthroscopic left knee surgery     BIOPSY  10/28/2017   Procedure: BIOPSY;  Surgeon: Danie Binder, MD;  [facility];  Service: Endoscopy;;  duodenal biopsy , gastric biopsy    DILATION AND CURETTAGE OF UTERUS  x3   ESOPHAGOGASTRODUODENOSCOPY (EGD) WITH PROPOFOL N/A 10/28/2017   Procedure: ESOPHAGOGASTRODUODENOSCOPY (EGD) WITH PROPOFOL;  Surgeon: Danie Binder, MD;  [facility];  Service: Endoscopy;  Laterality: N/A;  8:15am   HIP SURGERY Right 2015   iliac wings     KNEE SURGERY Bilateral 2015, 2016   MANDIBLE SURGERY     X 2   PARTIAL KNEE ARTHROPLASTY Left 02/12/2014  Procedure: LEFT KNEE UNICOMPARTMENTAL MEDIALLY ARTHROPLASTY ;  Surgeon: Mauri Pole, MD;  Location: [facility];  Service: Orthopedics;  Laterality: Left;   PARTIAL KNEE ARTHROPLASTY Right 07/02/2014   Procedure: RIGHT UNI KNEE ARTHOPLASTY MEDIALLY;  Surgeon: Paralee Cancel, MD;  Location: [facility];  Service: Orthopedics;  Laterality: Right;   rod in spine     scoliosis    TONSILLECTOMY     TOTAL HIP ARTHROPLASTY Right 05/09/2013   Procedure: RIGHT TOTAL HIP ARTHROPLASTY ANTERIOR APPROACH;  Surgeon: Mauri Pole, MD;  Location: [facility];  Service: Orthopedics;  Laterality: Right;   TUBAL LIGATION     WRIST SURGERY       OB History   No obstetric  history on file.     Family History  Problem Relation Age of Onset   Heart disease Mother    Diabetes Mother    Colon cancer Father 50   Heart disease Brother     Social History   Tobacco Use   Smoking status: Former    Packs/day: 2.00    Years: 19.00    Pack years: 38.00    Types: Cigarettes    Quit date: 03/10/1979    Years since quitting: 41.7   Smokeless tobacco: Never   Tobacco comments:    Quit in 1981  Vaping Use   Vaping Use: Never used  Substance Use Topics   Alcohol use: No    Alcohol/week: 0.0 standard drinks    Comment: no   Drug use: No    Home Medications Prior to Admission medications   Medication Sig Start Date End Date Taking? Authorizing Provider  albuterol [facility] HFA) 108 (90 Base) MCG/ACT inhaler USE 2 INHALATIONS EVERY 4 HOURS AS NEEDED 07/18/20   Martyn Ehrich, NP  albuterol (PROVENTIL) (2.5 MG/3ML) 0.083% nebulizer solution Take 3 mLs (2.5 mg total) by nebulization every 6 (six) hours as needed for wheezing or shortness of breath. 05/14/20   Martyn Ehrich, NP  baclofen (LIORESAL) 10 MG tablet Take 10 mg by mouth 3 (three) times daily as needed for muscle spasms.    [provider]  Cholecalciferol (VITAMIN D) 2000 UNITS CAPS Take 4,000 Units by mouth daily.     [provider]  diclofenac (VOLTAREN) 75 MG EC tablet TAKE 1 TABLET TWICE A DAY Patient not taking: Reported on 10/29/2020 03/28/20   Kathyrn Drown, MD  fluticasone (FLONASE) 50 MCG/ACT nasal spray Place 2 sprays into both nostrils daily. Patient not taking: Reported on 10/29/2020 07/18/18   Kathyrn Drown, MD  fluticasone (FLOVENT HFA) 110 MCG/ACT inhaler Inhale 1-2 puffs into the lungs 2 (two) times daily. 07/18/20   Martyn Ehrich, NP  HYDROcodone-acetaminophen [facility]) 10-325 MG tablet Take one tablet twice daily prn pain 06/24/20   Kathyrn Drown, MD  hydrOXYzine (VISTARIL) 25 MG capsule Take 1-2 capsules (25-50 mg total) by mouth 3 (three) times daily as  needed for anxiety. 10/29/20   Kirsteins, Luanna Salk, MD  levothyroxine (SYNTHROID) 75 MCG tablet Take one tablet po daily 10/17/20   Kathyrn Drown, MD  loperamide (IMODIUM) 2 MG capsule Take 2 mg by mouth as needed for diarrhea or loose stools.    [provider]  LORazepam (ATIVAN) 0.5 MG tablet Take one tablet po qhs prn 06/24/20   Luking, Elayne Snare, MD  ondansetron (ZOFRAN) 4 MG tablet Take 1 tablet (4 mg total) by mouth every 6 (six) hours. Patient taking differently: Take 4 mg by mouth as  needed for nausea or vomiting. 09/02/17   Kathyrn Drown, MD  Polyethyl Glycol-Propyl Glycol (SYSTANE ULTRA OP) Place 1 drop into both eyes at bedtime.    [provider]  rizatriptan (MAXALT) 10 MG tablet May repeat in 2 hours if needed 03/18/20   Kathyrn Drown, MD  tiZANidine (ZANAFLEX) 2 MG tablet One po tid prn caution drowsiness 10/07/20   Kathyrn Drown, MD  trolamine salicylate (ASPERCREME) 10 % cream Apply 1 application topically as needed for muscle pain.    [provider]    Allergies    Prednisone, Sulfa antibiotics, Adhesive [tape], Baclofen, Benadryl [diphenhydramine], Betadine [povidone iodine], Claritin [loratadine], Demerol [meperidine], Lamictal [lamotrigine], Latex, Levaquin [levofloxacin in d5w], Silvadene [silver sulfadiazine], Tizanidine, Trazodone and nefazodone, Wound dressing adhesive, Zyrtec [cetirizine], Ampicillin, Betadine [povidone-iodine], Cephalexin, Doxycycline, Erythromycin, Keflex [cephalexin], Other, Penicillins, and Tetracyclines & related  Review of Systems   Review of Systems  Constitutional:  Positive for fatigue. Negative for chills and fever.  HENT:  Negative for ear pain and sore throat.   Eyes:  Negative for pain and visual disturbance.  Respiratory:  Negative for cough and shortness of breath.   Cardiovascular:  Negative for chest pain and palpitations.  Gastrointestinal:  Positive for abdominal pain (suprapubic). Negative for nausea and  vomiting.  Genitourinary:  Positive for dysuria and frequency. Negative for flank pain and hematuria.  Musculoskeletal:  Negative for arthralgias and back pain.  Skin:  Negative for color change and rash.  Neurological:  Negative for seizures and syncope.  All other systems reviewed and are negative.  Physical Exam Updated Vital Signs BP 125/70 (BP Location: Left Arm)   Pulse (!) 57   Temp 99.1 F (37.3 C)   Resp 16   SpO2 100%   Physical Exam Vitals and nursing note reviewed.  Constitutional:      Appearance: She is well-developed.  HENT:     Head: Normocephalic and atraumatic.  Cardiovascular:     Rate and Rhythm: Normal rate and regular rhythm.     Heart sounds: Normal heart sounds.  Pulmonary:     Effort: Pulmonary effort is normal. No tachypnea.     Breath sounds: Normal breath sounds.  Abdominal:     Palpations: Abdomen is soft.     Tenderness: There is no abdominal tenderness.  Musculoskeletal:     Right lower leg: No edema.     Left lower leg: No edema.  Skin:    General: Skin is warm and dry.  Neurological:     General: No focal deficit present.     Mental Status: She is alert and oriented to person, place, and time.  Psychiatric:        Mood and Affect: Mood normal.        Behavior: Behavior normal.    ED Results / Procedures / Treatments   Labs (all labs ordered are listed, but only abnormal results are displayed) Labs Reviewed  URINALYSIS, ROUTINE W REFLEX MICROSCOPIC - Abnormal; Notable for the following components:      Result Value   APPearance HAZY (*)    Ketones, ur 5 (*)    All other components within normal limits  COMPREHENSIVE METABOLIC PANEL  CBC WITH DIFFERENTIAL/PLATELET     EKG None  Radiology No results found.  Procedures Procedures   Medications Ordered in ED Medications  fosfomycin (MONUROL) packet 3 g (has no administration in time range)    ED Course  I have reviewed the triage vital signs  and the nursing  notes.  Pertinent labs & imaging results that were available during my care of the patient were reviewed by me and considered in my medical decision making (see chart for details).    MDM Rules/Calculators/A&P                           Jenna Contreras presents to the ED upon referral by an urgent care.  She was treated with Macrobid for a UTI on 11/13/2020.  She is having persistent symptoms despite having a normal looking urine.  She has also endorsed some fatigue.  Urinalysis is not consistent with infection today.  Regardless, she was quite concerned about the presence of an ongoing infection.  I told her I do not think she needs IV antibiotics, but I did write for 1 dose of fosfomycin which will certainly cover this infection.  CBC and CMP were obtained to evaluate for evidence of systemic symptoms or alternative source of her fatigue such as anemia or electrolyte abnormalities.  Labs are within normal limits, and I would recommend that she follow closely with her primary care doctor to further evaluate the source of her fatigue. Final Clinical Impression(s) / ED Diagnoses Final diagnoses:  Malaise and fatigue  Urinary frequency    Rx / DC Orders ED Discharge Orders     None        Arnaldo Natal, MD 11/24/20 [phone]

## 2020-11-25 ENCOUNTER — Encounter (HOSPITAL_COMMUNITY): Payer: Medicare Other

## 2020-11-26 DIAGNOSIS — T887XXA Unspecified adverse effect of drug or medicament, initial encounter: Secondary | ICD-10-CM | POA: Diagnosis not present

## 2020-11-26 DIAGNOSIS — Z880 Allergy status to penicillin: Secondary | ICD-10-CM | POA: Diagnosis not present

## 2020-11-28 ENCOUNTER — Encounter (HOSPITAL_COMMUNITY): Payer: Medicare Other | Admitting: Physical Therapy

## 2020-12-09 ENCOUNTER — Ambulatory Visit (HOSPITAL_COMMUNITY): Payer: Medicare Other | Attending: Physical Medicine & Rehabilitation

## 2020-12-09 ENCOUNTER — Other Ambulatory Visit: Payer: Self-pay

## 2020-12-09 DIAGNOSIS — M7918 Myalgia, other site: Secondary | ICD-10-CM | POA: Insufficient documentation

## 2020-12-09 DIAGNOSIS — R29898 Other symptoms and signs involving the musculoskeletal system: Secondary | ICD-10-CM | POA: Insufficient documentation

## 2020-12-09 DIAGNOSIS — R2681 Unsteadiness on feet: Secondary | ICD-10-CM | POA: Diagnosis not present

## 2020-12-09 DIAGNOSIS — M6281 Muscle weakness (generalized): Secondary | ICD-10-CM | POA: Diagnosis not present

## 2020-12-09 NOTE — Therapy (Signed)
[place], [place], [address] Phone: [phone]   Fax:  [phone]  Physical Therapy Treatment  Patient Details  Name: Jenna Contreras MRN: [id number] Date of Birth: 20-Sep-1945 Referring Provider (PT): Charlett Blake, MD   Encounter Date: 12/09/2020   PT End of Session - 12/09/20 0830     Visit Number 2    Number of Visits 8    Date for PT Re-Evaluation 12/20/20    Authorization Type Medicare Part A and B    PT Start Time 0824   late arrival   PT Stop Time 0900    PT Time Calculation (min) 36 min    Activity Tolerance Patient limited by pain    Behavior During Therapy [facility] for tasks assessed/performed             Past Medical History:  Diagnosis Date   Anxiety    Arthritis    Arthritis    Asthma    Bipolar 2 disorder (Home)    Cancer ([place])    Basal Cell   Constipation    Depression    Fibromyalgia    GERD (gastroesophageal reflux disease)    Heart murmur    As small child    History of gout    History of skin cancer    HNP (herniated nucleus pulposus with myelopathy), thoracic    Hypothyroidism    Left eye injury    In ED 06/17/2014    Low BP    Migraines    OCD (obsessive compulsive disorder)    Osteoporosis    Psoriasis    Scoliosis     Past Surgical History:  Procedure Laterality Date   Arthroscopic left knee surgery     BIOPSY  10/28/2017   Procedure: BIOPSY;  Surgeon: Danie Binder, MD;  [facility];  Service: Endoscopy;;  duodenal biopsy , gastric biopsy    DILATION AND CURETTAGE OF UTERUS  x3   ESOPHAGOGASTRODUODENOSCOPY (EGD) WITH PROPOFOL N/A 10/28/2017   Procedure: ESOPHAGOGASTRODUODENOSCOPY (EGD) WITH PROPOFOL;  Surgeon: Danie Binder, MD;  [facility];  Service: Endoscopy;  Laterality: N/A;  8:15am   HIP SURGERY Right 2015   iliac wings     KNEE SURGERY Bilateral 2015, 2016   MANDIBLE SURGERY     X 2   PARTIAL KNEE ARTHROPLASTY  Left 02/12/2014   Procedure: LEFT KNEE UNICOMPARTMENTAL MEDIALLY ARTHROPLASTY ;  Surgeon: Mauri Pole, MD;  Location: [facility];  Service: Orthopedics;  Laterality: Left;   PARTIAL KNEE ARTHROPLASTY Right 07/02/2014   Procedure: RIGHT UNI KNEE ARTHOPLASTY MEDIALLY;  Surgeon: Paralee Cancel, MD;  Location: [facility];  Service: Orthopedics;  Laterality: Right;   rod in spine     scoliosis    TONSILLECTOMY     TOTAL HIP ARTHROPLASTY Right 05/09/2013   Procedure: RIGHT TOTAL HIP ARTHROPLASTY ANTERIOR APPROACH;  Surgeon: Mauri Pole, MD;  Location: [facility];  Service: Orthopedics;  Laterality: Right;   TUBAL LIGATION     WRIST SURGERY      There were no vitals filed for this visit.   Subjective Assessment - 12/09/20 0829     Subjective "Pt notes no episode of sharp spasm/pain but it feels like it's on the verge of happening"    Pertinent History RA, OA, hx of scoliosis surgery/ extensive fusion T4-iliac crests    Patient Stated Goals "Reduce these spasms and decrease pain so I can take care of my  house"    Currently in Pain? Yes    Pain Score 3     Pain Location Thoracic    Pain Orientation Mid    Pain Descriptors / Indicators Aching;Burning    Pain Type Chronic pain                [facility] PT Assessment - 12/09/20 0001       Assessment   Medical Diagnosis Myofascial pain syndrome of cervical/thoracic    Referring Provider (PT) Kirsteins, Luanna Salk, MD                           [facility] Adult PT Treatment/Exercise - 12/09/20 0001       Exercises   Exercises Neck      Neck Exercises: Supine   Cervical Isometrics Extension;10 reps;3 secs    Capital Flexion 10 reps   2 sec     Neck Exercises: Prone   Neck Retraction 10 reps                     PT Education - 12/09/20 0942     Education Details education on TENS unit IFC pad palcement and rationale    Person(s) Educated Patient    Methods Explanation;Demonstration    Comprehension Verbalized  understanding              PT Short Term Goals - 11/22/20 1138       PT SHORT TERM GOAL #1   Title Patient will be independent with HEP, updated PRN, to improve strength and functional mobility as well as reduce pain.    Time 2    Period Weeks    Status New    Target Date 12/06/20      PT SHORT TERM GOAL #2   Title Patient will report at least 25% improvement in symptoms for improved quality of life.    Baseline episodes of spasms and rates pain in back/arms 8/10 with overhead movements    Time 2    Period Weeks    Status New    Target Date 12/06/20      PT SHORT TERM GOAL #3   Title Demonstrate improved balance as evidenced by 10 sec single leg stance right/left without UE support    Baseline 5-10 sec with BUE support    Time 2    Period Weeks    Status New    Target Date 12/06/20               PT Long Term Goals - 11/22/20 1143       PT LONG TERM GOAL #1   Title Demonstrate improved left cervical rotation to 25% restriction to improve comfort and safety when driving    Baseline 31% limitation    Time 4    Period Weeks    Status New    Target Date 12/20/20      PT LONG TERM GOAL #2   Title Demonstrate 4/5 BUE strength to improve function for overhead activities    Baseline 3/5 gross strength, 8/10 pain with overhead movements    Time 4    Period Weeks    Status New    Target Date 12/20/20      PT LONG TERM GOAL #3   Title Demonstrate tandem stance x 30 sec without LOB to manifest improved static standing balance    Baseline 5-10 sec with BUE support    Time 4  Period Weeks    Status New    Target Date 12/20/20      PT LONG TERM GOAL #4   Title Pt will report no more than 3/10 pain in her upper back throughout the day, to improve general activity tolerance.     Baseline 3-10/10    Time 4    Period Weeks    Status New    Target Date 12/20/20                   Plan - 12/09/20 0942     Clinical Impression Statement Poor tolerance  for prone position. Demo fatigue with postural correction exercises. Rationale for tx aimed at improved postural muscle endurance to improve efficiency. Continued sessions to ameliorate myofascial pain and improve postural strength to reduce synmptoms    Personal Factors and Comorbidities Age;Time since onset of injury/illness/exacerbation;Past/Current Experience    Examination-Activity Limitations Bend;Carry;Lift;Reach Overhead;Locomotion Level;Transfers    Examination-Participation Restrictions Cleaning;Community Activity;Yard Work    [employer] Evolving/Moderate complexity    Rehab Potential Fair    PT Frequency 2x / week    PT Duration 4 weeks    PT Treatment/Interventions ADLs/Self Care Home Management;Electrical Stimulation;Gait training;Functional mobility training;Therapeutic activities;Therapeutic exercise;Balance training;Neuromuscular re-education;Manual techniques;Taping;Splinting;Dry needling    PT Next Visit Plan cervical spine rotation SNAG, scapular strengthening, STM to traps?    PT Home Exercise Plan unable to initiate at time of evaluation    Consulted and Agree with Plan of Care Patient             Patient will benefit from skilled therapeutic intervention in order to improve the following deficits and impairments:  Decreased activity tolerance, Decreased balance, Decreased endurance, Decreased range of motion, Decreased strength, Hypomobility, Increased fascial restricitons, Increased muscle spasms, Impaired perceived functional ability, Postural dysfunction, Pain  Visit Diagnosis: Myofascial pain syndrome of thoracic spine  Muscle weakness (generalized)  Other symptoms and signs involving the musculoskeletal system     Problem List Patient Active Problem List   Diagnosis Date Noted   Coronary artery disease involving native heart without angina pectoris 01/31/2020   Grief reaction 01/22/2020   Upper respiratory tract infection  05/30/2018   Dyspepsia    FH: colon cancer 09/18/2015   Osteoporosis 05/20/2015   Moderate single current episode of major depressive disorder ([place]) 02/18/2015   Status post unilateral knee replacement 07/02/2014   Lung nodule 02/06/2014   Hyperlipidemia 01/18/2014   S/P right THA, AA 05/09/2013   Endometrial polyp 02/28/2013   Osteoarthritis of right hip 02/15/2013   Chronic back pain 08/17/2012   Hypothyroid 08/17/2012   Osteopenia 08/17/2012   Hypersomnia 11/18/2011   Asthma, mild persistent 08/05/2011   TIA (transient ischemic attack) 08/05/2011   Pulmonary nodule 08/05/2011   SOB (shortness of breath) 01/05/2011    Toniann Fail, PT 12/09/2020, 9:44 AM  [facility] [address] [place], [place], [address] Phone: [phone]   Fax:  [phone]  Name: Ketzia Guzek MRN: [id number] Date of Birth: 09-26-1945

## 2020-12-10 DIAGNOSIS — Z981 Arthrodesis status: Secondary | ICD-10-CM | POA: Diagnosis not present

## 2020-12-10 DIAGNOSIS — M542 Cervicalgia: Secondary | ICD-10-CM | POA: Diagnosis not present

## 2020-12-10 DIAGNOSIS — M62830 Muscle spasm of back: Secondary | ICD-10-CM | POA: Diagnosis not present

## 2020-12-10 DIAGNOSIS — G8929 Other chronic pain: Secondary | ICD-10-CM | POA: Diagnosis not present

## 2020-12-11 ENCOUNTER — Other Ambulatory Visit: Payer: Self-pay

## 2020-12-11 ENCOUNTER — Ambulatory Visit (HOSPITAL_COMMUNITY): Payer: Medicare Other

## 2020-12-11 DIAGNOSIS — R29898 Other symptoms and signs involving the musculoskeletal system: Secondary | ICD-10-CM

## 2020-12-11 DIAGNOSIS — M6281 Muscle weakness (generalized): Secondary | ICD-10-CM | POA: Diagnosis not present

## 2020-12-11 DIAGNOSIS — M7918 Myalgia, other site: Secondary | ICD-10-CM | POA: Diagnosis not present

## 2020-12-11 DIAGNOSIS — R2681 Unsteadiness on feet: Secondary | ICD-10-CM | POA: Diagnosis not present

## 2020-12-11 NOTE — Therapy (Signed)
[place], [place], [address] Phone: [phone]   Fax:  [phone]  Physical Therapy Treatment  Patient Details  Name: Jenna Contreras MRN: [id number] Date of Birth: 12/09/1945 Referring Provider (PT): Charlett Blake, MD   Encounter Date: 12/11/2020   PT End of Session - 12/11/20 1042     Visit Number 3    Number of Visits 8    Date for PT Re-Evaluation 12/20/20    Authorization Type Medicare Part A and B    PT Start Time 4536   late arrival   PT Stop Time 1115    PT Time Calculation (min) 34 min    Activity Tolerance Patient limited by pain    Behavior During Therapy [facility] for tasks assessed/performed             Past Medical History:  Diagnosis Date   Anxiety    Arthritis    Arthritis    Asthma    Bipolar 2 disorder ([place])    Cancer ([place])    Basal Cell   Constipation    Depression    Fibromyalgia    GERD (gastroesophageal reflux disease)    Heart murmur    As small child    History of gout    History of skin cancer    HNP (herniated nucleus pulposus with myelopathy), thoracic    Hypothyroidism    Left eye injury    In ED 06/17/2014    Low BP    Migraines    OCD (obsessive compulsive disorder)    Osteoporosis    Psoriasis    Scoliosis     Past Surgical History:  Procedure Laterality Date   Arthroscopic left knee surgery     BIOPSY  10/28/2017   Procedure: BIOPSY;  Surgeon: Danie Binder, MD;  [facility];  Service: Endoscopy;;  duodenal biopsy , gastric biopsy    DILATION AND CURETTAGE OF UTERUS  x3   ESOPHAGOGASTRODUODENOSCOPY (EGD) WITH PROPOFOL N/A 10/28/2017   Procedure: ESOPHAGOGASTRODUODENOSCOPY (EGD) WITH PROPOFOL;  Surgeon: Danie Binder, MD;  [facility];  Service: Endoscopy;  Laterality: N/A;  8:15am   HIP SURGERY Right 2015   iliac wings     KNEE SURGERY Bilateral 2015, 2016   MANDIBLE SURGERY     X 2   PARTIAL KNEE ARTHROPLASTY  Left 02/12/2014   Procedure: LEFT KNEE UNICOMPARTMENTAL MEDIALLY ARTHROPLASTY ;  Surgeon: Mauri Pole, MD;  [facility];  Service: Orthopedics;  Laterality: Left;   PARTIAL KNEE ARTHROPLASTY Right 07/02/2014   Procedure: RIGHT UNI KNEE ARTHOPLASTY MEDIALLY;  Surgeon: Paralee Cancel, MD;  Location: [facility];  Service: Orthopedics;  Laterality: Right;   rod in spine     scoliosis    TONSILLECTOMY     TOTAL HIP ARTHROPLASTY Right 05/09/2013   Procedure: RIGHT TOTAL HIP ARTHROPLASTY ANTERIOR APPROACH;  Surgeon: Mauri Pole, MD;  [facility];  Service: Orthopedics;  Laterality: Right;   TUBAL LIGATION     WRIST SURGERY      There were no vitals filed for this visit.   Subjective Assessment - 12/11/20 1229     Subjective Pt reports she met with new MD for assessment of hr condition and reports they were discussing trigger point injections for painrelief. No change in symptoms at this time    Pertinent History RA, OA, hx of scoliosis surgery/ extensive fusion T4-iliac crests    Patient Stated Goals "Reduce  these spasms and decrease pain so I can take care of my house"    Currently in Pain? Yes    Pain Score 4     Pain Location Thoracic    Pain Orientation Mid    Pain Type Chronic pain                               [facility] Adult PT Treatment/Exercise - 12/11/20 0001       Manual Therapy   Manual Therapy Soft tissue mobilization;Taping    Manual therapy comments completed separately from therapeutic exercises     Soft tissue mobilization STM to cervical/thoracic area to address tension, trigger point, pain along trap ridge, scalenes, and levator scap as well as scapular mm    Kinesiotex Facilitate Muscle      Kinesiotix   Facilitate Muscle  postural re-education facilitation to lower cervical and mid thoracic                       PT Short Term Goals - 11/22/20 1138       PT SHORT TERM GOAL #1   Title Patient will be independent with HEP,  updated PRN, to improve strength and functional mobility as well as reduce pain.    Time 2    Period Weeks    Status New    Target Date 12/06/20      PT SHORT TERM GOAL #2   Title Patient will report at least 25% improvement in symptoms for improved quality of life.    Baseline episodes of spasms and rates pain in back/arms 8/10 with overhead movements    Time 2    Period Weeks    Status New    Target Date 12/06/20      PT SHORT TERM GOAL #3   Title Demonstrate improved balance as evidenced by 10 sec single leg stance right/left without UE support    Baseline 5-10 sec with BUE support    Time 2    Period Weeks    Status New    Target Date 12/06/20               PT Long Term Goals - 11/22/20 1143       PT LONG TERM GOAL #1   Title Demonstrate improved left cervical rotation to 25% restriction to improve comfort and safety when driving    Baseline 59% limitation    Time 4    Period Weeks    Status New    Target Date 12/20/20      PT LONG TERM GOAL #2   Title Demonstrate 4/5 BUE strength to improve function for overhead activities    Baseline 3/5 gross strength, 8/10 pain with overhead movements    Time 4    Period Weeks    Status New    Target Date 12/20/20      PT LONG TERM GOAL #3   Title Demonstrate tandem stance x 30 sec without LOB to manifest improved static standing balance    Baseline 5-10 sec with BUE support    Time 4    Period Weeks    Status New    Target Date 12/20/20      PT LONG TERM GOAL #4   Title Pt will report no more than 3/10 pain in her upper back throughout the day, to improve general activity tolerance.     Baseline 3-10/10  Time 4    Period Weeks    Status New    Target Date 12/20/20                   Plan - 12/11/20 1232     Clinical Impression Statement Demonstrates tension and TTP along left upper trap/scalene/levator more than right.  Some relief with STM at end of session with decreased tension appreciated in  muscle groups.  Continued sessions to refine HEP and progress functional strengthening to reduce soft tissue dysfunction and improve activity tolerance    Personal Factors and Comorbidities Age;Time since onset of injury/illness/exacerbation;Past/Current Experience    Examination-Activity Limitations Bend;Carry;Lift;Reach Overhead;Locomotion Level;Transfers    Examination-Participation Restrictions Cleaning;Community Activity;Yard Work    [employer] Evolving/Moderate complexity    Rehab Potential Fair    PT Frequency 2x / week    PT Duration 4 weeks    PT Treatment/Interventions ADLs/Self Care Home Management;Electrical Stimulation;Gait training;Functional mobility training;Therapeutic activities;Therapeutic exercise;Balance training;Neuromuscular re-education;Manual techniques;Taping;Splinting;Dry needling    PT Next Visit Plan cervical spine rotation SNAG, scapular strengthening, STM to traps?    Consulted and Agree with Plan of Care Patient             Patient will benefit from skilled therapeutic intervention in order to improve the following deficits and impairments:  Decreased activity tolerance, Decreased balance, Decreased endurance, Decreased range of motion, Decreased strength, Hypomobility, Increased fascial restricitons, Increased muscle spasms, Impaired perceived functional ability, Postural dysfunction, Pain  Visit Diagnosis: Myofascial pain syndrome of thoracic spine  Muscle weakness (generalized)  Other symptoms and signs involving the musculoskeletal system     Problem List Patient Active Problem List   Diagnosis Date Noted   Coronary artery disease involving native heart without angina pectoris 01/31/2020   Grief reaction 01/22/2020   Upper respiratory tract infection 05/30/2018   Dyspepsia    FH: colon cancer 09/18/2015   Osteoporosis 05/20/2015   Moderate single current episode of major depressive disorder ([place]) 02/18/2015   Status  post unilateral knee replacement 07/02/2014   Lung nodule 02/06/2014   Hyperlipidemia 01/18/2014   S/P right THA, AA 05/09/2013   Endometrial polyp 02/28/2013   Osteoarthritis of right hip 02/15/2013   Chronic back pain 08/17/2012   Hypothyroid 08/17/2012   Osteopenia 08/17/2012   Hypersomnia 11/18/2011   Asthma, mild persistent 08/05/2011   TIA (transient ischemic attack) 08/05/2011   Pulmonary nodule 08/05/2011   SOB (shortness of breath) 01/05/2011    Toniann Fail, PT 12/11/2020, 12:34 PM  [place] [address] [place], [place], [address] Phone: [phone]   Fax:  [phone]  Name: Jenna Contreras MRN: [id number] Date of Birth: November 03, 1945

## 2020-12-16 ENCOUNTER — Other Ambulatory Visit: Payer: Self-pay

## 2020-12-16 ENCOUNTER — Ambulatory Visit (HOSPITAL_COMMUNITY): Payer: Medicare Other

## 2020-12-16 DIAGNOSIS — M7918 Myalgia, other site: Secondary | ICD-10-CM | POA: Diagnosis not present

## 2020-12-16 DIAGNOSIS — R29898 Other symptoms and signs involving the musculoskeletal system: Secondary | ICD-10-CM | POA: Diagnosis not present

## 2020-12-16 DIAGNOSIS — M6281 Muscle weakness (generalized): Secondary | ICD-10-CM

## 2020-12-16 DIAGNOSIS — R2681 Unsteadiness on feet: Secondary | ICD-10-CM | POA: Diagnosis not present

## 2020-12-16 NOTE — Therapy (Signed)
[place], [place], [address] Phone: [phone]   Fax:  [phone]  Physical Therapy Treatment  Patient Details  Name: Jenna Contreras MRN: [id number] Date of Birth: Apr 17, 1945 Referring Provider (PT): Charlett Blake, MD   Encounter Date: 12/16/2020   PT End of Session - 12/16/20 1037     Visit Number 4    Number of Visits 8    Date for PT Re-Evaluation 12/20/20    Authorization Type Medicare Part A and B    PT Start Time 1037    PT Stop Time 1115    PT Time Calculation (min) 38 min    Activity Tolerance Patient limited by pain    Behavior During Therapy [facility] for tasks assessed/performed             Past Medical History:  Diagnosis Date   Anxiety    Arthritis    Arthritis    Asthma    Bipolar 2 disorder ([place])    Cancer ([place])    Basal Cell   Constipation    Depression    Fibromyalgia    GERD (gastroesophageal reflux disease)    Heart murmur    As small child    History of gout    History of skin cancer    HNP (herniated nucleus pulposus with myelopathy), thoracic    Hypothyroidism    Left eye injury    In ED 06/17/2014    Low BP    Migraines    OCD (obsessive compulsive disorder)    Osteoporosis    Psoriasis    Scoliosis     Past Surgical History:  Procedure Laterality Date   Arthroscopic left knee surgery     BIOPSY  10/28/2017   Procedure: BIOPSY;  Surgeon: Danie Binder, MD;  [facility];  Service: Endoscopy;;  duodenal biopsy , gastric biopsy    DILATION AND CURETTAGE OF UTERUS  x3   ESOPHAGOGASTRODUODENOSCOPY (EGD) WITH PROPOFOL N/A 10/28/2017   Procedure: ESOPHAGOGASTRODUODENOSCOPY (EGD) WITH PROPOFOL;  Surgeon: Danie Binder, MD;  [facility];  Service: Endoscopy;  Laterality: N/A;  8:15am   HIP SURGERY Right 2015   iliac wings     KNEE SURGERY Bilateral 2015, 2016   MANDIBLE SURGERY     X 2   PARTIAL KNEE ARTHROPLASTY Left 02/12/2014    Procedure: LEFT KNEE UNICOMPARTMENTAL MEDIALLY ARTHROPLASTY ;  Surgeon: Mauri Pole, MD;  [facility];  Service: Orthopedics;  Laterality: Left;   PARTIAL KNEE ARTHROPLASTY Right 07/02/2014   Procedure: RIGHT UNI KNEE ARTHOPLASTY MEDIALLY;  Surgeon: Paralee Cancel, MD;  [facility];  Service: Orthopedics;  Laterality: Right;   rod in spine     scoliosis    TONSILLECTOMY     TOTAL HIP ARTHROPLASTY Right 05/09/2013   Procedure: RIGHT TOTAL HIP ARTHROPLASTY ANTERIOR APPROACH;  Surgeon: Mauri Pole, MD;  [facility];  Service: Orthopedics;  Laterality: Right;   TUBAL LIGATION     WRIST SURGERY      There were no vitals filed for this visit.   Subjective Assessment - 12/16/20 1040     Subjective No spasm episodes and notes several days of relief after last session    Pertinent History RA, OA, hx of scoliosis surgery/ extensive fusion T4-iliac crests    Patient Stated Goals "Reduce these spasms and decrease pain so I can take care of my house"    Currently in Pain? Yes  Pain Score 2     Pain Location Neck    Pain Orientation Mid    Pain Descriptors / Indicators Aching;Burning                [facility] PT Assessment - 12/16/20 0001       Assessment   Medical Diagnosis Myofascial pain syndrome of cervical/thoracic    Referring Provider (PT) Kirsteins, Luanna Salk, MD                           [facility] Adult PT Treatment/Exercise - 12/16/20 0001       Neck Exercises: Seated   Other Seated Exercise external rotation 2x10 with green      Neck Exercises: Prone   Rows 20 reps    Rows Weights (lbs) 4    Rows Limitations 2x10      Manual Therapy   Manual Therapy Soft tissue mobilization;Taping    Manual therapy comments completed separately from therapeutic exercises     Soft tissue mobilization STM to cervical/thoracic area to address tension, trigger point, pain along trap ridge, scalenes, and levator scap as well as scapular mm                        PT Short Term Goals - 11/22/20 1138       PT SHORT TERM GOAL #1   Title Patient will be independent with HEP, updated PRN, to improve strength and functional mobility as well as reduce pain.    Time 2    Period Weeks    Status New    Target Date 12/06/20      PT SHORT TERM GOAL #2   Title Patient will report at least 25% improvement in symptoms for improved quality of life.    Baseline episodes of spasms and rates pain in back/arms 8/10 with overhead movements    Time 2    Period Weeks    Status New    Target Date 12/06/20      PT SHORT TERM GOAL #3   Title Demonstrate improved balance as evidenced by 10 sec single leg stance right/left without UE support    Baseline 5-10 sec with BUE support    Time 2    Period Weeks    Status New    Target Date 12/06/20               PT Long Term Goals - 11/22/20 1143       PT LONG TERM GOAL #1   Title Demonstrate improved left cervical rotation to 25% restriction to improve comfort and safety when driving    Baseline 32% limitation    Time 4    Period Weeks    Status New    Target Date 12/20/20      PT LONG TERM GOAL #2   Title Demonstrate 4/5 BUE strength to improve function for overhead activities    Baseline 3/5 gross strength, 8/10 pain with overhead movements    Time 4    Period Weeks    Status New    Target Date 12/20/20      PT LONG TERM GOAL #3   Title Demonstrate tandem stance x 30 sec without LOB to manifest improved static standing balance    Baseline 5-10 sec with BUE support    Time 4    Period Weeks    Status New    Target Date 12/20/20  PT LONG TERM GOAL #4   Title Pt will report no more than 3/10 pain in her upper back throughout the day, to improve general activity tolerance.     Baseline 3-10/10    Time 4    Period Weeks    Status New    Target Date 12/20/20                   Plan - 12/16/20 1139     Clinical Impression Statement decrease in upper  trap/thoracic tension/palpable trigger points noted today. Progressed with strengthening/endurance resistance exercises for scapular muscles to improve efficiency to reduce soft tissue dysfunction. Continued sessions indicated to imrpove strength, activity tolerance, and reduce soft tissue pain    Personal Factors and Comorbidities Age;Time since onset of injury/illness/exacerbation;Past/Current Experience    Examination-Activity Limitations Bend;Carry;Lift;Reach Overhead;Locomotion Level;Transfers    Examination-Participation Restrictions Cleaning;Community Activity;Yard Work    [employer] officer Evolving/Moderate complexity    Rehab Potential Fair    PT Frequency 2x / week    PT Duration 4 weeks    PT Treatment/Interventions ADLs/Self Care Home Management;Electrical Stimulation;Gait training;Functional mobility training;Therapeutic activities;Therapeutic exercise;Balance training;Neuromuscular re-education;Manual techniques;Taping;Splinting;Dry needling    PT Next Visit Plan cervical spine rotation SNAG, scapular strengthening, STM to traps?    Consulted and Agree with Plan of Care Patient             Patient will benefit from skilled therapeutic intervention in order to improve the following deficits and impairments:  Decreased activity tolerance, Decreased balance, Decreased endurance, Decreased range of motion, Decreased strength, Hypomobility, Increased fascial restricitons, Increased muscle spasms, Impaired perceived functional ability, Postural dysfunction, Pain  Visit Diagnosis: Myofascial pain syndrome of thoracic spine  Muscle weakness (generalized)  Other symptoms and signs involving the musculoskeletal system     Problem List Patient Active Problem List   Diagnosis Date Noted   Coronary artery disease involving native heart without angina pectoris 01/31/2020   Grief reaction 01/22/2020   Upper respiratory tract infection 05/30/2018   Dyspepsia    FH:  colon cancer 09/18/2015   Osteoporosis 05/20/2015   Moderate single current episode of major depressive disorder ([place]) 02/18/2015   Status post unilateral knee replacement 07/02/2014   Lung nodule 02/06/2014   Hyperlipidemia 01/18/2014   S/P right THA, AA 05/09/2013   Endometrial polyp 02/28/2013   Osteoarthritis of right hip 02/15/2013   Chronic back pain 08/17/2012   Hypothyroid 08/17/2012   Osteopenia 08/17/2012   Hypersomnia 11/18/2011   Asthma, mild persistent 08/05/2011   TIA (transient ischemic attack) 08/05/2011   Pulmonary nodule 08/05/2011   SOB (shortness of breath) 01/05/2011    Toniann Fail, PT 12/16/2020, 11:41 AM  [place] [address] [place], [place], [address] Phone: [phone]   Fax:  [phone]  Name: Jenna Contreras MRN: [id number] Date of Birth: 21-Dec-1945

## 2020-12-18 ENCOUNTER — Ambulatory Visit (HOSPITAL_COMMUNITY): Payer: Medicare Other

## 2020-12-18 ENCOUNTER — Other Ambulatory Visit: Payer: Self-pay

## 2020-12-18 DIAGNOSIS — M6281 Muscle weakness (generalized): Secondary | ICD-10-CM

## 2020-12-18 DIAGNOSIS — M7918 Myalgia, other site: Secondary | ICD-10-CM | POA: Diagnosis not present

## 2020-12-18 DIAGNOSIS — R2681 Unsteadiness on feet: Secondary | ICD-10-CM | POA: Diagnosis not present

## 2020-12-18 DIAGNOSIS — R29898 Other symptoms and signs involving the musculoskeletal system: Secondary | ICD-10-CM

## 2020-12-18 NOTE — Therapy (Signed)
[place], [place], [address] Phone: [phone]   Fax:  [phone]  Physical Therapy Treatment  Patient Details  Name: Jenna Contreras MRN: [id number] Date of Birth: 1945-10-10 Referring Provider (PT): Charlett Blake, MD   Encounter Date: 12/18/2020   PT End of Session - 12/18/20 0952     Visit Number 5    Number of Visits 8    Date for PT Re-Evaluation 12/20/20    Authorization Type Medicare Part A and B    PT Start Time 0951    PT Stop Time 1030    PT Time Calculation (min) 39 min    Activity Tolerance Patient limited by pain    Behavior During Therapy [facility] for tasks assessed/performed             Past Medical History:  Diagnosis Date   Anxiety    Arthritis    Arthritis    Asthma    Bipolar 2 disorder ([place])    Cancer ([place])    Basal Cell   Constipation    Depression    Fibromyalgia    GERD (gastroesophageal reflux disease)    Heart murmur    As small child    History of gout    History of skin cancer    HNP (herniated nucleus pulposus with myelopathy), thoracic    Hypothyroidism    Left eye injury    In ED 06/17/2014    Low BP    Migraines    OCD (obsessive compulsive disorder)    Osteoporosis    Psoriasis    Scoliosis     Past Surgical History:  Procedure Laterality Date   Arthroscopic left knee surgery     BIOPSY  10/28/2017   Procedure: BIOPSY;  Surgeon: Danie Binder, MD;  [facility];  Service: Endoscopy;;  duodenal biopsy , gastric biopsy    DILATION AND CURETTAGE OF UTERUS  x3   ESOPHAGOGASTRODUODENOSCOPY (EGD) WITH PROPOFOL N/A 10/28/2017   Procedure: ESOPHAGOGASTRODUODENOSCOPY (EGD) WITH PROPOFOL;  Surgeon: Danie Binder, MD;  [facility];  Service: Endoscopy;  Laterality: N/A;  8:15am   HIP SURGERY Right 2015   iliac wings     KNEE SURGERY Bilateral 2015, 2016   MANDIBLE SURGERY     X 2   PARTIAL KNEE ARTHROPLASTY Left 02/12/2014    Procedure: LEFT KNEE UNICOMPARTMENTAL MEDIALLY ARTHROPLASTY ;  Surgeon: Mauri Pole, MD;  [facility];  Service: Orthopedics;  Laterality: Left;   PARTIAL KNEE ARTHROPLASTY Right 07/02/2014   Procedure: RIGHT UNI KNEE ARTHOPLASTY MEDIALLY;  Surgeon: Paralee Cancel, MD;  [facility]: [facility];  Service: Orthopedics;  Laterality: Right;   rod in spine     scoliosis    TONSILLECTOMY     TOTAL HIP ARTHROPLASTY Right 05/09/2013   Procedure: RIGHT TOTAL HIP ARTHROPLASTY ANTERIOR APPROACH;  Surgeon: Mauri Pole, MD;  [facility];  Service: Orthopedics;  Laterality: Right;   TUBAL LIGATION     WRIST SURGERY      There were no vitals filed for this visit.   Subjective Assessment - 12/18/20 0956     Subjective I was a an 8/10 yesterday had to take medication 3x. Today I feel a 6/10.    Pertinent History RA, OA, hx of scoliosis surgery/ extensive fusion T4-iliac crests    Patient Stated Goals "Reduce these spasms and decrease pain so I can take care of my house"  Currently in Pain? Yes    Pain Score 6     Pain Location Neck    Pain Orientation Mid;Lower    Pain Type Chronic pain                               [facility] Adult PT Treatment/Exercise - 12/18/20 0001       Neck Exercises: Standing   Other Standing Exercises tricep extension, high row, unilateral row, low row, paloff press 2x10                     PT Education - 12/18/20 1039     Education Details education on accupuncture for myofascial pain    Person(s) Educated Patient    Methods Explanation    Comprehension Verbalized understanding              PT Short Term Goals - 11/22/20 1138       PT SHORT TERM GOAL #1   Title Patient will be independent with HEP, updated PRN, to improve strength and functional mobility as well as reduce pain.    Time 2    Period Weeks    Status New    Target Date 12/06/20      PT SHORT TERM GOAL #2   Title Patient will report at least 25%  improvement in symptoms for improved quality of life.    Baseline episodes of spasms and rates pain in back/arms 8/10 with overhead movements    Time 2    Period Weeks    Status New    Target Date 12/06/20      PT SHORT TERM GOAL #3   Title Demonstrate improved balance as evidenced by 10 sec single leg stance right/left without UE support    Baseline 5-10 sec with BUE support    Time 2    Period Weeks    Status New    Target Date 12/06/20               PT Long Term Goals - 11/22/20 1143       PT LONG TERM GOAL #1   Title Demonstrate improved left cervical rotation to 25% restriction to improve comfort and safety when driving    Baseline 74% limitation    Time 4    Period Weeks    Status New    Target Date 12/20/20      PT LONG TERM GOAL #2   Title Demonstrate 4/5 BUE strength to improve function for overhead activities    Baseline 3/5 gross strength, 8/10 pain with overhead movements    Time 4    Period Weeks    Status New    Target Date 12/20/20      PT LONG TERM GOAL #3   Title Demonstrate tandem stance x 30 sec without LOB to manifest improved static standing balance    Baseline 5-10 sec with BUE support    Time 4    Period Weeks    Status New    Target Date 12/20/20      PT LONG TERM GOAL #4   Title Pt will report no more than 3/10 pain in her upper back throughout the day, to improve general activity tolerance.     Baseline 3-10/10    Time 4    Period Weeks    Status New    Target Date 12/20/20  Plan - 12/18/20 1039     Clinical Impression Statement left upper trap tightness/tenderness > right. Not much change in symptoms. Recommend continued strengthening with resistance bands vs free weights to reduce pain/DOMS    Personal Factors and Comorbidities Age;Time since onset of injury/illness/exacerbation;Past/Current Experience    Examination-Activity Limitations Bend;Carry;Lift;Reach Overhead;Locomotion Level;Transfers     Examination-Participation Restrictions Cleaning;Community Activity;Yard Work    [employer] Evolving/Moderate complexity    Rehab Potential Fair    PT Frequency 2x / week    PT Duration 4 weeks    PT Treatment/Interventions ADLs/Self Care Home Management;Electrical Stimulation;Gait training;Functional mobility training;Therapeutic activities;Therapeutic exercise;Balance training;Neuromuscular re-education;Manual techniques;Taping;Splinting;Dry needling    PT Next Visit Plan cervical spine rotation SNAG, scapular strengthening, STM to traps?    Consulted and Agree with Plan of Care Patient             Patient will benefit from skilled therapeutic intervention in order to improve the following deficits and impairments:  Decreased activity tolerance, Decreased balance, Decreased endurance, Decreased range of motion, Decreased strength, Hypomobility, Increased fascial restricitons, Increased muscle spasms, Impaired perceived functional ability, Postural dysfunction, Pain  Visit Diagnosis: Myofascial pain syndrome of thoracic spine  Muscle weakness (generalized)  Other symptoms and signs involving the musculoskeletal system  Unsteadiness on feet     Problem List Patient Active Problem List   Diagnosis Date Noted   Coronary artery disease involving native heart without angina pectoris 01/31/2020   Grief reaction 01/22/2020   Upper respiratory tract infection 05/30/2018   Dyspepsia    FH: colon cancer 09/18/2015   Osteoporosis 05/20/2015   Moderate single current episode of major depressive disorder ([place]) 02/18/2015   Status post unilateral knee replacement 07/02/2014   Lung nodule 02/06/2014   Hyperlipidemia 01/18/2014   S/P right THA, AA 05/09/2013   Endometrial polyp 02/28/2013   Osteoarthritis of right hip 02/15/2013   Chronic back pain 08/17/2012   Hypothyroid 08/17/2012   Osteopenia 08/17/2012   Hypersomnia 11/18/2011   Asthma, mild persistent  08/05/2011   TIA (transient ischemic attack) 08/05/2011   Pulmonary nodule 08/05/2011   SOB (shortness of breath) 01/05/2011    Toniann Fail, PT 12/18/2020, 10:41 AM  [place] [address] [place], [place], [address] Phone: [phone]   Fax:  [phone]  Name: Jenna Contreras MRN: [id number] Date of Birth: 04/27/1945

## 2020-12-19 ENCOUNTER — Ambulatory Visit: Payer: Medicare Other | Admitting: Family Medicine

## 2020-12-23 ENCOUNTER — Ambulatory Visit (HOSPITAL_COMMUNITY): Payer: Medicare Other

## 2020-12-23 ENCOUNTER — Ambulatory Visit: Payer: Medicare Other | Admitting: Family Medicine

## 2020-12-27 ENCOUNTER — Ambulatory Visit (HOSPITAL_COMMUNITY): Payer: Medicare Other

## 2020-12-27 ENCOUNTER — Other Ambulatory Visit: Payer: Self-pay

## 2020-12-27 DIAGNOSIS — Z880 Allergy status to penicillin: Secondary | ICD-10-CM | POA: Diagnosis not present

## 2020-12-27 DIAGNOSIS — M7918 Myalgia, other site: Secondary | ICD-10-CM

## 2020-12-27 DIAGNOSIS — R29898 Other symptoms and signs involving the musculoskeletal system: Secondary | ICD-10-CM | POA: Diagnosis not present

## 2020-12-27 DIAGNOSIS — M6281 Muscle weakness (generalized): Secondary | ICD-10-CM

## 2020-12-27 DIAGNOSIS — R2681 Unsteadiness on feet: Secondary | ICD-10-CM | POA: Diagnosis not present

## 2020-12-27 NOTE — Therapy (Signed)
[place], [place], [address] Phone: [phone]   Fax:  [phone]  Physical Therapy Treatment and Recertification  Patient Details  Name: Jenna Contreras MRN: [id number] Date of Birth: 1945-07-21 Referring Provider (PT): Charlett Blake, MD   Encounter Date: 12/27/2020   PT End of Session - 12/27/20 0824     Visit Number 6    Number of Visits 8    Date for PT Re-Evaluation 01/10/21    Authorization Type Medicare Part A and B    PT Start Time 0824   late arrival   PT Stop Time 0900    PT Time Calculation (min) 36 min    Activity Tolerance Patient limited by pain    Behavior During Therapy [facility] for tasks assessed/performed             Past Medical History:  Diagnosis Date   Anxiety    Arthritis    Arthritis    Asthma    Bipolar 2 disorder ([place])    Cancer ([place])    Basal Cell   Constipation    Depression    Fibromyalgia    GERD (gastroesophageal reflux disease)    Heart murmur    As small child    History of gout    History of skin cancer    HNP (herniated nucleus pulposus with myelopathy), thoracic    Hypothyroidism    Left eye injury    In ED 06/17/2014    Low BP    Migraines    OCD (obsessive compulsive disorder)    Osteoporosis    Psoriasis    Scoliosis     Past Surgical History:  Procedure Laterality Date   Arthroscopic left knee surgery     BIOPSY  10/28/2017   Procedure: BIOPSY;  Surgeon: Danie Binder, MD;  [facility];  Service: Endoscopy;;  duodenal biopsy , gastric biopsy    DILATION AND CURETTAGE OF UTERUS  x3   ESOPHAGOGASTRODUODENOSCOPY (EGD) WITH PROPOFOL N/A 10/28/2017   Procedure: ESOPHAGOGASTRODUODENOSCOPY (EGD) WITH PROPOFOL;  Surgeon: Danie Binder, MD;  [facility];  Service: Endoscopy;  Laterality: N/A;  8:15am   HIP SURGERY Right 2015   iliac wings     KNEE SURGERY Bilateral 2015, 2016   MANDIBLE SURGERY     X 2   PARTIAL  KNEE ARTHROPLASTY Left 02/12/2014   Procedure: LEFT KNEE UNICOMPARTMENTAL MEDIALLY ARTHROPLASTY ;  Surgeon: Mauri Pole, MD;  [facility];  Service: Orthopedics;  Laterality: Left;   PARTIAL KNEE ARTHROPLASTY Right 07/02/2014   Procedure: RIGHT UNI KNEE ARTHOPLASTY MEDIALLY;  Surgeon: Paralee Cancel, MD;  [facility];  Service: Orthopedics;  Laterality: Right;   rod in spine     scoliosis    TONSILLECTOMY     TOTAL HIP ARTHROPLASTY Right 05/09/2013   Procedure: RIGHT TOTAL HIP ARTHROPLASTY ANTERIOR APPROACH;  Surgeon: Mauri Pole, MD;  [facility];  Service: Orthopedics;  Laterality: Right;   TUBAL LIGATION     WRIST SURGERY      There were no vitals filed for this visit.   Subjective Assessment - 12/27/20 0825     Subjective 5/10 pain, I don't think there's been much change    Pertinent History RA, OA, hx of scoliosis surgery/ extensive fusion T4-iliac crests    Patient Stated Goals "Reduce these spasms and decrease pain so I can take care of my house"    Currently in  Pain? Yes    Pain Score 5     Pain Location Thoracic    Pain Orientation Mid;Right    Pain Descriptors / Indicators Aching;Burning    Pain Type Chronic pain    Aggravating Factors  left head/neck rotation                [facility] PT Assessment - 12/27/20 0001       Assessment   Medical Diagnosis Myofascial pain syndrome of cervical/thoracic    Referring Provider (PT) Kirsteins, Luanna Salk, MD      AROM   AROM Assessment Site Cervical    Cervical Flexion WNL    Cervical Extension WNL    Cervical - Right Side Bend WNL    Cervical - Left Side Bend 25% limited    Cervical - Right Rotation WNL    Cervical - Left Rotation 50% limited      Strength   Overall Strength Comments 3+/5 gross BUE strength and scapular strength      Palpation   Palpation comment left cervical column and levator scap tender to palpation      Static Standing Balance   Static Standing Balance -  Activities  Single Leg  Stance - Right Leg;Single Leg Stance - Left Leg;Tandam Stance - Right Leg;Tandam Stance - Left Leg   10-15 sec                          [facility] Adult PT Treatment/Exercise - 12/27/20 0001       Neck Exercises: Seated   Other Seated Exercise extension and sidebend SNAG with towel      Neck Exercises: Supine   Cervical Isometrics Extension;10 reps;3 secs    Neck Retraction 10 reps    Capital Flexion 10 reps;3 secs                     PT Education - 12/27/20 0945     Education Details explanation/demonstration of over-the-door cervical traction    Person(s) Educated Patient    Methods Explanation    Comprehension Verbalized understanding              PT Short Term Goals - 12/27/20 0826       PT SHORT TERM GOAL #1   Title Patient will be independent with HEP, updated PRN, to improve strength and functional mobility as well as reduce pain.    Time 2    Period Weeks    Status On-going    Target Date 01/10/21      PT SHORT TERM GOAL #2   Title Patient will report at least 25% improvement in symptoms for improved quality of life.    Baseline episodes of spasms and rates pain in back/arms 8/10 with overhead movements, pt notes no change    Time 2    Period Weeks    Status On-going    Target Date 01/10/21      PT SHORT TERM GOAL #3   Title Demonstrate improved balance as evidenced by 10 sec single leg stance right/left without UE support    Baseline 10-15 sec, no UE support    Time 2    Period Weeks    Status Achieved    Target Date 12/06/20               PT Long Term Goals - 12/27/20 0834       PT LONG TERM GOAL #1   Title  Demonstrate improved left cervical rotation to 25% restriction to improve comfort and safety when driving    Baseline 48% limitation when against gravity, 50% left rotation restriction when supine    Time 4    Period Weeks    Status On-going      PT LONG TERM GOAL #2   Title Demonstrate 4/5 BUE strength to  improve function for overhead activities    Baseline 3+/5 gross strength, 8/10 pain with overhead movements    Time 4    Period Weeks    Status On-going      PT LONG TERM GOAL #3   Title Demonstrate tandem stance x 30 sec without LOB to manifest improved static standing balance    Baseline 10-15 sec    Time 4    Period Weeks    Status On-going    Target Date 01/10/21      PT LONG TERM GOAL #4   Title Pt will report no more than 3/10 pain in her upper back throughout the day, to improve general activity tolerance.     Baseline 3-8/10 pain    Time 4    Period Weeks    Status On-going    Target Date 01/10/21                   Plan - 12/27/20 0914     Clinical Impression Statement Continues to demonstrate left cervical rotation/side bending restrictions, more limited in against gravity vs supine.  Pt subjective report indicates no demonstrable change. Improvement in single leg balance and improved UE strength but no change in pain or functional progress.  Pt educated on idea of symptom managment and maintenance of strength/ROM exercises to limit further deterioration.    Personal Factors and Comorbidities Age;Time since onset of injury/illness/exacerbation;Past/Current Experience    Examination-Activity Limitations Bend;Carry;Lift;Reach Overhead;Locomotion Level;Transfers    Examination-Participation Restrictions Cleaning;Community Activity;Yard Work    [employer] officer Evolving/Moderate complexity    Rehab Potential Fair    PT Frequency 2x / week    PT Duration 4 weeks    PT Treatment/Interventions ADLs/Self Care Home Management;Electrical Stimulation;Gait training;Functional mobility training;Therapeutic activities;Therapeutic exercise;Balance training;Neuromuscular re-education;Manual techniques;Taping;Splinting;Dry needling    PT Next Visit Plan Review HEP for strengthening, has green t-band and door anchor, prone dumbell rows    Consulted and Agree with  Plan of Care Patient             Patient will benefit from skilled therapeutic intervention in order to improve the following deficits and impairments:  Decreased activity tolerance, Decreased balance, Decreased endurance, Decreased range of motion, Decreased strength, Hypomobility, Increased fascial restricitons, Increased muscle spasms, Impaired perceived functional ability, Postural dysfunction, Pain  Visit Diagnosis: Myofascial pain syndrome of thoracic spine  Muscle weakness (generalized)  Other symptoms and signs involving the musculoskeletal system     Problem List Patient Active Problem List   Diagnosis Date Noted   Coronary artery disease involving native heart without angina pectoris 01/31/2020   Grief reaction 01/22/2020   Upper respiratory tract infection 05/30/2018   Dyspepsia    FH: colon cancer 09/18/2015   Osteoporosis 05/20/2015   Moderate single current episode of major depressive disorder ([place]) 02/18/2015   Status post unilateral knee replacement 07/02/2014   Lung nodule 02/06/2014   Hyperlipidemia 01/18/2014   S/P right THA, AA 05/09/2013   Endometrial polyp 02/28/2013   Osteoarthritis of right hip 02/15/2013   Chronic back pain 08/17/2012   Hypothyroid 08/17/2012   Osteopenia 08/17/2012  Hypersomnia 11/18/2011   Asthma, mild persistent 08/05/2011   TIA (transient ischemic attack) 08/05/2011   Pulmonary nodule 08/05/2011   SOB (shortness of breath) 01/05/2011    Toniann Fail, PT 12/27/2020, 9:51 AM  [place] [address] [place], [place], [address] Phone: [phone]   Fax:  [phone]  Name: Jenna Contreras MRN: [id number] Date of Birth: 04-Jun-1945

## 2020-12-31 ENCOUNTER — Ambulatory Visit (HOSPITAL_COMMUNITY): Payer: Medicare Other

## 2020-12-31 ENCOUNTER — Other Ambulatory Visit: Payer: Self-pay

## 2020-12-31 ENCOUNTER — Encounter: Payer: Medicare Other | Admitting: Physical Medicine & Rehabilitation

## 2020-12-31 DIAGNOSIS — R29898 Other symptoms and signs involving the musculoskeletal system: Secondary | ICD-10-CM

## 2020-12-31 DIAGNOSIS — R2681 Unsteadiness on feet: Secondary | ICD-10-CM | POA: Diagnosis not present

## 2020-12-31 DIAGNOSIS — M6281 Muscle weakness (generalized): Secondary | ICD-10-CM

## 2020-12-31 DIAGNOSIS — M7918 Myalgia, other site: Secondary | ICD-10-CM

## 2020-12-31 NOTE — Therapy (Signed)
[place], [place], [address] Phone: [phone]   Fax:  [phone]  Physical Therapy Treatment  Patient Details  Name: Jenna Contreras MRN: [id number] Date of Birth: April 05, 1945 Referring Provider (PT): Charlett Blake, MD   Encounter Date: 12/31/2020   PT End of Session - 12/31/20 0907     Visit Number 7    Number of Visits 8    Date for PT Re-Evaluation 01/10/21    Authorization Type Medicare Part A and B    PT Start Time 0905    PT Stop Time 0945    PT Time Calculation (min) 40 min    Activity Tolerance Patient limited by pain    Behavior During Therapy [facility] for tasks assessed/performed             Past Medical History:  Diagnosis Date   Anxiety    Arthritis    Arthritis    Asthma    Bipolar 2 disorder ([place])    Cancer ([place])    Basal Cell   Constipation    Depression    Fibromyalgia    GERD (gastroesophageal reflux disease)    Heart murmur    As small child    History of gout    History of skin cancer    HNP (herniated nucleus pulposus with myelopathy), thoracic    Hypothyroidism    Left eye injury    In ED 06/17/2014    Low BP    Migraines    OCD (obsessive compulsive disorder)    Osteoporosis    Psoriasis    Scoliosis     Past Surgical History:  Procedure Laterality Date   Arthroscopic left knee surgery     BIOPSY  10/28/2017   Procedure: BIOPSY;  Surgeon: Danie Binder, MD;  [facility];  Service: Endoscopy;;  duodenal biopsy , gastric biopsy    DILATION AND CURETTAGE OF UTERUS  x3   ESOPHAGOGASTRODUODENOSCOPY (EGD) WITH PROPOFOL N/A 10/28/2017   Procedure: ESOPHAGOGASTRODUODENOSCOPY (EGD) WITH PROPOFOL;  Surgeon: Danie Binder, MD;  [facility];  Service: Endoscopy;  Laterality: N/A;  8:15am   HIP SURGERY Right 2015   iliac wings     KNEE SURGERY Bilateral 2015, 2016   MANDIBLE SURGERY     X 2   PARTIAL KNEE ARTHROPLASTY Left 02/12/2014    Procedure: LEFT KNEE UNICOMPARTMENTAL MEDIALLY ARTHROPLASTY ;  Surgeon: Mauri Pole, MD;  [facility];  Service: Orthopedics;  Laterality: Left;   PARTIAL KNEE ARTHROPLASTY Right 07/02/2014   Procedure: RIGHT UNI KNEE ARTHOPLASTY MEDIALLY;  Surgeon: Paralee Cancel, MD;  [facility];  Service: Orthopedics;  Laterality: Right;   rod in spine     scoliosis    TONSILLECTOMY     TOTAL HIP ARTHROPLASTY Right 05/09/2013   Procedure: RIGHT TOTAL HIP ARTHROPLASTY ANTERIOR APPROACH;  Surgeon: Mauri Pole, MD;  [facility];  Service: Orthopedics;  Laterality: Right;   TUBAL LIGATION     WRIST SURGERY      There were no vitals filed for this visit.   Subjective Assessment - 12/31/20 0908     Subjective No change in condition to report    Pertinent History RA, OA, hx of scoliosis surgery/ extensive fusion T4-iliac crests    Currently in Pain? Yes    Pain Score 5     Pain Location Thoracic    Pain Orientation Mid;Right    Pain Descriptors / Indicators  Aching;Burning    Pain Type Chronic pain                [facility] PT Assessment - 12/31/20 0001       Assessment   Medical Diagnosis Myofascial pain syndrome of cervical/thoracic                           [facility] Adult PT Treatment/Exercise - 12/31/20 0001       Neck Exercises: Standing   Other Standing Exercises tricep extension, high row, unilateral row, low row, paloff press 2x10      Neck Exercises: Seated   Other Seated Exercise external rotation 2x10 with green      Neck Exercises: Supine   Cervical Isometrics Extension;15 reps    Neck Retraction 15 reps;3 secs    Capital Flexion 15 reps;3 secs      Neck Exercises: Prone   Rows 15 reps    Rows Weights (lbs) 4                       PT Short Term Goals - 12/27/20 1245       PT SHORT TERM GOAL #1   Title Patient will be independent with HEP, updated PRN, to improve strength and functional mobility as well as reduce pain.     Time 2    Period Weeks    Status On-going    Target Date 01/10/21      PT SHORT TERM GOAL #2   Title Patient will report at least 25% improvement in symptoms for improved quality of life.    Baseline episodes of spasms and rates pain in back/arms 8/10 with overhead movements, pt notes no change    Time 2    Period Weeks    Status On-going    Target Date 01/10/21      PT SHORT TERM GOAL #3   Title Demonstrate improved balance as evidenced by 10 sec single leg stance right/left without UE support    Baseline 10-15 sec, no UE support    Time 2    Period Weeks    Status Achieved    Target Date 12/06/20               PT Long Term Goals - 12/27/20 0834       PT LONG TERM GOAL #1   Title Demonstrate improved left cervical rotation to 25% restriction to improve comfort and safety when driving    Baseline 80% limitation when against gravity, 50% left rotation restriction when supine    Time 4    Period Weeks    Status On-going      PT LONG TERM GOAL #2   Title Demonstrate 4/5 BUE strength to improve function for overhead activities    Baseline 3+/5 gross strength, 8/10 pain with overhead movements    Time 4    Period Weeks    Status On-going      PT LONG TERM GOAL #3   Title Demonstrate tandem stance x 30 sec without LOB to manifest improved static standing balance    Baseline 10-15 sec    Time 4    Period Weeks    Status On-going    Target Date 01/10/21      PT LONG TERM GOAL #4   Title Pt will report no more than 3/10 pain in her upper back throughout the day, to improve general activity tolerance.  Baseline 3-8/10 pain    Time 4    Period Weeks    Status On-going    Target Date 01/10/21                   Plan - 12/31/20 0945     Clinical Impression Statement Demonstrates good HEP recall without use of work sheets. Continue x 1 visit for re-assessment and additional pt education in summary and D/C to HEP    Personal Factors and Comorbidities  Age;Time since onset of injury/illness/exacerbation;Past/Current Experience    Examination-Activity Limitations Bend;Carry;Lift;Reach Overhead;Locomotion Level;Transfers    Examination-Participation Restrictions Cleaning;Community Activity;Yard Work    [employer]chant [employer] officer Evolving/Moderate complexity    Rehab Potential Fair    PT Frequency 2x / week    PT Duration 4 weeks    PT Treatment/Interventions ADLs/Self Care Home Management;Electrical Stimulation;Gait training;Functional mobility training;Therapeutic activities;Therapeutic exercise;Balance training;Neuromuscular re-education;Manual techniques;Taping;Splinting;Dry needling    PT Next Visit Plan Review HEP for strengthening, has green t-band and door anchor, prone dumbell rows    Consulted and Agree with Plan of Care Patient             Patient will benefit from skilled therapeutic intervention in order to improve the following deficits and impairments:  Decreased activity tolerance, Decreased balance, Decreased endurance, Decreased range of motion, Decreased strength, Hypomobility, Increased fascial restricitons, Increased muscle spasms, Impaired perceived functional ability, Postural dysfunction, Pain  Visit Diagnosis: Myofascial pain syndrome of thoracic spine  Muscle weakness (generalized)  Other symptoms and signs involving the musculoskeletal system     Problem List Patient Active Problem List   Diagnosis Date Noted   Coronary artery disease involving native heart without angina pectoris 01/31/2020   Grief reaction 01/22/2020   Upper respiratory tract infection 05/30/2018   Dyspepsia    FH: colon cancer 09/18/2015   Osteoporosis 05/20/2015   Moderate single current episode of major depressive disorder ([place]) 02/18/2015   Status post unilateral knee replacement 07/02/2014   Lung nodule 02/06/2014   Hyperlipidemia 01/18/2014   S/P right THA, AA 05/09/2013   Endometrial polyp 02/28/2013    Osteoarthritis of right hip 02/15/2013   Chronic back pain 08/17/2012   Hypothyroid 08/17/2012   Osteopenia 08/17/2012   Hypersomnia 11/18/2011   Asthma, mild persistent 08/05/2011   TIA (transient ischemic attack) 08/05/2011   Pulmonary nodule 08/05/2011   SOB (shortness of breath) 01/05/2011    Toniann Fail, PT 12/31/2020, 9:46 AM  [place] [address] [place], [place], [address] Phone: [phone]   Fax:  [phone]  Name: Ziaire Bieser MRN: [id number] Date of Birth: [birth year]

## 2021-01-02 ENCOUNTER — Encounter: Payer: Medicare Other | Attending: Physical Medicine & Rehabilitation | Admitting: Physical Medicine & Rehabilitation

## 2021-01-02 ENCOUNTER — Other Ambulatory Visit: Payer: Self-pay

## 2021-01-02 ENCOUNTER — Encounter: Payer: Self-pay | Admitting: Physical Medicine & Rehabilitation

## 2021-01-02 VITALS — BP 110/70 | HR 57 | Ht 62.0 in | Wt 112.6 lb

## 2021-01-02 DIAGNOSIS — M47812 Spondylosis without myelopathy or radiculopathy, cervical region: Secondary | ICD-10-CM | POA: Diagnosis not present

## 2021-01-02 DIAGNOSIS — M25561 Pain in right knee: Secondary | ICD-10-CM | POA: Diagnosis not present

## 2021-01-02 DIAGNOSIS — M25562 Pain in left knee: Secondary | ICD-10-CM

## 2021-01-02 DIAGNOSIS — G8929 Other chronic pain: Secondary | ICD-10-CM

## 2021-01-02 DIAGNOSIS — M7918 Myalgia, other site: Secondary | ICD-10-CM | POA: Diagnosis not present

## 2021-01-02 NOTE — Patient Instructions (Signed)
Genicular nerve blocks with potential Genicular nerve radiofrequency neurotomy- performed in this clinic  Cervical medial branch blocks plus/minus radiofrequency neurotomy - not performed in this clinic

## 2021-01-02 NOTE — Progress Notes (Signed)
Subjective:    Patient ID: Jenna Contreras, female    DOB: January 09, 1946, 75 y.o.   MRN: [id number]  HPI  75 yo female with hx of scoliosis , s/p extensive T4- sacral fusion performed by Dr Amedeo Plenty  Hx of bilateral TKR and RIght THA- seeing Ortho at [facility] doing   Several recent stressors, pet cat is in animal hospital ICU, also anniversary of her daughter's death which occurred many years ago. Took a hike for the first time in quite a while with increased pain.  She was hiking by her self on uneven terrain.  We discussed that given her multiple orthopedic issues, it is not safe for her to hike so low in a sparsely populated area.  Saw PT, Claiborne Billings at [facility], myofascial release which resulted in 100% pain relief in spine for 7d  Saw Dr Octavio Manns Spine surgery in [place], planning to remove rods and screws from iliac area Saw pain management at [facility], recommendations for dry needling  Pain Inventory Average Pain 10 Pain Right Now 7 My pain is constant, aching, and spasms  In the last 24 hours, has pain interfered with the following? General activity 7 Relation with others 7 Enjoyment of life 7 What TIME of day is your pain at its worst? morning , daytime, evening, and night Sleep (in general) Fair  Pain is worse with: walking, sitting, and some activites Pain improves with: medication and TENS Relief from Meds: 0  Family History  Problem Relation Age of Onset   Heart disease Mother    Diabetes Mother    Colon cancer Father 44   Heart disease Brother    Social History   Socioeconomic History   Marital status: Widowed    Spouse name: Not on file   Number of children: Not on file   Years of education: Not on file   Highest education level: Not on file  Occupational History   Occupation: Social worker and retired Marine scientist  Tobacco Use   Smoking status: Former    Packs/day: 2.00    Years: 19.00    Pack years: 38.00    Types: Cigarettes    Quit  date: 03/10/1979    Years since quitting: 41.8   Smokeless tobacco: Never   Tobacco comments:    Quit in 1981  Vaping Use   Vaping Use: Never used  Substance and Sexual Activity   Alcohol use: No    Alcohol/week: 0.0 standard drinks    Comment: no   Drug use: No   Sexual activity: Not Currently    Birth control/protection: Post-menopausal  Other Topics Concern   Not on file  Social History Narrative   Not on file   Social Determinants of Health   Financial Resource Strain: Not on file  Food Insecurity: Not on file  Transportation Needs: Not on file  Physical Activity: Not on file  Stress: Not on file  Social Connections: Not on file   Past Surgical History:  Procedure Laterality Date   Arthroscopic left knee surgery     BIOPSY  10/28/2017   Procedure: BIOPSY;  Surgeon: Danie Binder, MD;  [facility];  Service: Endoscopy;;  duodenal biopsy , gastric biopsy    DILATION AND CURETTAGE OF UTERUS  x3   ESOPHAGOGASTRODUODENOSCOPY (EGD) WITH PROPOFOL N/A 10/28/2017   Procedure: ESOPHAGOGASTRODUODENOSCOPY (EGD) WITH PROPOFOL;  Surgeon: Danie Binder, MD;  [facility];  Service: Endoscopy;  Laterality: N/A;  8:15am  HIP SURGERY Right 2015   iliac wings     KNEE SURGERY Bilateral 2015, 2016   MANDIBLE SURGERY     X 2   PARTIAL KNEE ARTHROPLASTY Left 02/12/2014   Procedure: LEFT KNEE UNICOMPARTMENTAL MEDIALLY ARTHROPLASTY ;  Surgeon: Mauri Pole, MD;  [facility];  Service: Orthopedics;  Laterality: Left;   PARTIAL KNEE ARTHROPLASTY Right 07/02/2014   Procedure: RIGHT UNI KNEE ARTHOPLASTY MEDIALLY;  Surgeon: Paralee Cancel, MD;  [facility];  Service: Orthopedics;  Laterality: Right;   rod in spine     scoliosis    TONSILLECTOMY     TOTAL HIP ARTHROPLASTY Right 05/09/2013   Procedure: RIGHT TOTAL HIP ARTHROPLASTY ANTERIOR APPROACH;  Surgeon: Mauri Pole, MD;  [facility];  Service: Orthopedics;  Laterality: Right;   TUBAL LIGATION      WRIST SURGERY     Past Surgical History:  Procedure Laterality Date   Arthroscopic left knee surgery     BIOPSY  10/28/2017   Procedure: BIOPSY;  Surgeon: Danie Binder, MD;  [facility];  Service: Endoscopy;;  duodenal biopsy , gastric biopsy    DILATION AND CURETTAGE OF UTERUS  x3   ESOPHAGOGASTRODUODENOSCOPY (EGD) WITH PROPOFOL N/A 10/28/2017   Procedure: ESOPHAGOGASTRODUODENOSCOPY (EGD) WITH PROPOFOL;  Surgeon: Danie Binder, MD;  [facility];  Service: Endoscopy;  Laterality: N/A;  8:15am   HIP SURGERY Right 2015   iliac wings     KNEE SURGERY Bilateral 2015, 2016   MANDIBLE SURGERY     X 2   PARTIAL KNEE ARTHROPLASTY Left 02/12/2014   Procedure: LEFT KNEE UNICOMPARTMENTAL MEDIALLY ARTHROPLASTY ;  Surgeon: Mauri Pole, MD;  [facility];  Service: Orthopedics;  Laterality: Left;   PARTIAL KNEE ARTHROPLASTY Right 07/02/2014   Procedure: RIGHT UNI KNEE ARTHOPLASTY MEDIALLY;  Surgeon: Paralee Cancel, MD;  [facility];  Service: Orthopedics;  Laterality: Right;   rod in spine     scoliosis    TONSILLECTOMY     TOTAL HIP ARTHROPLASTY Right 05/09/2013   Procedure: RIGHT TOTAL HIP ARTHROPLASTY ANTERIOR APPROACH;  Surgeon: Mauri Pole, MD;  [facility];  Service: Orthopedics;  Laterality: Right;   TUBAL LIGATION     WRIST SURGERY     Past Medical History:  Diagnosis Date   Anxiety    Arthritis    Arthritis    Asthma    Bipolar 2 disorder ([place])    Cancer ([facility])    Basal Cell   Constipation    Depression    Fibromyalgia    GERD (gastroesophageal reflux disease)    Heart murmur    As small child    History of gout    History of skin cancer    HNP (herniated nucleus pulposus with myelopathy), thoracic    Hypothyroidism    Left eye injury    In ED 06/17/2014    Low BP    Migraines    OCD (obsessive compulsive disorder)    Osteoporosis    Psoriasis    Scoliosis    BP 110/70   Pulse (!) 57   Ht 5\' 2"  (1.575 m)   Wt 112 lb 9.6 oz  (51.1 kg)   SpO2 97%   BMI 20.59 kg/m   Opioid Risk Score:   Fall Risk Score:  `1  Depression screen PHQ 2/9  Depression screen [facility] 2/9 10/29/2020 09/06/2020 08/22/2020 08/22/2020 06/24/2020 03/18/2020  Decreased Interest 3 0 0 0 0 3  Down, Depressed, Hopeless 3 0 0 0 0 2  PHQ - 2 Score 6 0 0 0 0 5  Altered sleeping 3 - - - - 3  Tired, decreased energy 3 - - - - 3  Change in appetite 3 - - - - 3  Feeling bad or failure about yourself  3 - - - - 1  Trouble concentrating 1 - - - - 3  Moving slowly or fidgety/restless 0 - - - - 0  Suicidal thoughts 0 - - - - 0  PHQ-9 Score 19 - - - - 18  Difficult doing work/chores - - - - - Extremely dIfficult  Some recent data might be hidden      Review of Systems  Musculoskeletal:  Positive for back pain.       Spasms  All other systems reviewed and are negative.     Objective:   Physical Exam Vitals and nursing note reviewed.  Constitutional:      Appearance: She is normal weight.  HENT:     Head: Normocephalic and atraumatic.  Eyes:     Extraocular Movements: Extraocular movements intact.     Conjunctiva/sclera: Conjunctivae normal.     Pupils: Pupils are equal, round, and reactive to light.  Musculoskeletal:     Comments: There is tenderness palpation in the thoracic paraspinal area bilaterally. No evidence of knee effusion bilaterally no pain with hip or knee range of motion bilaterally  Ambulates without assist device no evidence of toe drag or knee instability.  Skin:    General: Skin is warm and dry.  Neurological:     Mental Status: She is alert and oriented to person, place, and time.     Comments: Motor strength is 5/5 bilateral hip flexor knee extensor ankle dorsiflexors.  Psychiatric:        Mood and Affect: Mood normal.        Behavior: Behavior normal.       Assessment & Plan:  1.  Thoracic postlaminectomy syndrome with chronic upper back and periscapular pain.  This appears to be largely myofascial however we also  discussed overlapping pain referral patterns from lower cervical facet joints.  She does have some cervical facet arthropathy on imaging studies. She is responding well to myofascial release and physical therapy.  They are working on cervical spine range of motion as well.  She has a few more visits.  Recommendations also included massage therapy. The patient has improved in terms of her activity level as noted above.  She has not taken any hydroxyzine, continues on lorazepam as prescribed by primary care. Pain management is recommending dry needling. 2.  Chronic bilateral knee pain) left side she has undergone partial knee replacement on the right side.  She has pain with activity.  She is following up with orthopedics they are planning a gel injection.  Had 1 month relief with knee injection.  We discussed if current plan not helpful may be a candidate for genicular nerve blocks plus minus radiofrequency neurotomy. Physical medicine and rehab follow-up in 3 months. Greater than 30 minutes spent discussing several areas of pain, recommendations from spine surgery as well as pain management as well as physical therapy.  3.  Low back pain/iliac pain spine surgery planning removal of screws, pelvic fixation next month, they will provide postoperative pain management

## 2021-01-03 ENCOUNTER — Ambulatory Visit (HOSPITAL_COMMUNITY): Payer: Medicare Other

## 2021-01-03 DIAGNOSIS — R29898 Other symptoms and signs involving the musculoskeletal system: Secondary | ICD-10-CM | POA: Diagnosis not present

## 2021-01-03 DIAGNOSIS — R2681 Unsteadiness on feet: Secondary | ICD-10-CM | POA: Diagnosis not present

## 2021-01-03 DIAGNOSIS — M6281 Muscle weakness (generalized): Secondary | ICD-10-CM | POA: Diagnosis not present

## 2021-01-03 DIAGNOSIS — M7918 Myalgia, other site: Secondary | ICD-10-CM

## 2021-01-03 NOTE — Therapy (Signed)
[facility] [place], [place], [address] Phone: [phone]   Fax:  [phone]  Physical Therapy Treatment and D/C Summary  Patient Details  Name: Jenna Contreras MRN: [id number] Date of Birth: September 18, 1945 Referring Provider (PT): Kirsteins, Luanna Salk, MD  PHYSICAL THERAPY DISCHARGE SUMMARY  Visits from Start of Care: 8  Current functional level related to goals / functional outcomes: Partially met STG/LTG   Remaining deficits: Continued myofascial pain   Education / Equipment: HEP. Recommend trial of acupuncture   Patient agrees to discharge. Patient goals were partially met. Patient is being discharged due to maximized rehab potential.   Encounter Date: 01/03/2021   PT End of Session - 01/03/21 0944     Visit Number 8    Number of Visits 8    Date for PT Re-Evaluation 01/10/21    Authorization Type Medicare Part A and B    PT Start Time 0945    PT Stop Time 1030    PT Time Calculation (min) 45 min    Activity Tolerance Patient limited by pain    Behavior During Therapy WFL for tasks assessed/performed             Past Medical History:  Diagnosis Date   Anxiety    Arthritis    Arthritis    Asthma    Bipolar 2 disorder ([place])    Cancer ([place])    Basal Cell   Constipation    Depression    Fibromyalgia    GERD (gastroesophageal reflux disease)    Heart murmur    As small child    History of gout    History of skin cancer    HNP (herniated nucleus pulposus with myelopathy), thoracic    Hypothyroidism    Left eye injury    In ED 06/17/2014    Low BP    Migraines    OCD (obsessive compulsive disorder)    Osteoporosis    Psoriasis    Scoliosis     Past Surgical History:  Procedure Laterality Date   Arthroscopic left knee surgery     BIOPSY  10/28/2017   Procedure: BIOPSY;  Surgeon: Danie Binder, MD;  [facility];  Service: Endoscopy;;  duodenal biopsy , gastric biopsy     DILATION AND CURETTAGE OF UTERUS  x3   ESOPHAGOGASTRODUODENOSCOPY (EGD) WITH PROPOFOL N/A 10/28/2017   Procedure: ESOPHAGOGASTRODUODENOSCOPY (EGD) WITH PROPOFOL;  Surgeon: Danie Binder, MD;  Location[facility];  Service: Endoscopy;  Laterality: N/A;  8:15am   HIP SURGERY Right 2015   iliac wings     KNEE SURGERY Bilateral 2015, 2016   MANDIBLE SURGERY     X 2   PARTIAL KNEE ARTHROPLASTY Left 02/12/2014   Procedure: LEFT KNEE UNICOMPARTMENTAL MEDIALLY ARTHROPLASTY ;  Surgeon: Mauri Pole, MD;  Location: [facility];  Service: Orthopedics;  Laterality: Left;   PARTIAL KNEE ARTHROPLASTY Right 07/02/2014   Procedure: RIGHT UNI KNEE ARTHOPLASTY MEDIALLY;  Surgeon: Paralee Cancel, MD;  Location: [facility];  Service: Orthopedics;  Laterality: Right;   rod in spine     scoliosis    TONSILLECTOMY     TOTAL HIP ARTHROPLASTY Right 05/09/2013   Procedure: RIGHT TOTAL HIP ARTHROPLASTY ANTERIOR APPROACH;  Surgeon: Mauri Pole, MD;  Location: [facility];  Service: Orthopedics;  Laterality: Right;   TUBAL LIGATION     WRIST SURGERY      There were no vitals filed for this visit.  Subjective Assessment - 01/03/21 0948     Subjective Pt has appointment with surgeon to discuss hardware removal in her iliac/fusion.    Pertinent History RA, OA, hx of scoliosis surgery/ extensive fusion T4-iliac crests    Currently in Pain? Yes    Pain Score 7     Pain Location Thoracic    Pain Orientation Right;Mid    Pain Descriptors / Indicators Aching;Burning    Pain Type Chronic pain                [facility] PT Assessment - 01/03/21 0001       Assessment   Medical Diagnosis Myofascial pain syndrome of cervical/thoracic    Referring Provider (PT) Kirsteins, Luanna Salk, MD                           [facility] Adult PT Treatment/Exercise - 01/03/21 0001       Neck Exercises: Supine   Cervical Isometrics Extension;15 reps    Neck Retraction 15 reps;3 secs    Capital Flexion 15 reps;3 secs       Neck Exercises: Prone   Rows 15 reps    Rows Weights (lbs) 4                       PT Short Term Goals - 01/03/21 0955       PT SHORT TERM GOAL #1   Title Patient will be independent with HEP, updated PRN, to improve strength and functional mobility as well as reduce pain.    Time 2    Period Weeks    Status Achieved    Target Date 01/10/21      PT SHORT TERM GOAL #2   Title Patient will report at least 25% improvement in symptoms for improved quality of life.    Baseline episodes of spasms and rates pain in back/arms 8/10 with overhead movements, pt notes no change    Time 2    Period Weeks    Status Not Met    Target Date 01/10/21      PT SHORT TERM GOAL #3   Title Demonstrate improved balance as evidenced by 10 sec single leg stance right/left without UE support    Baseline 10-15 sec, no UE support    Time 2    Period Weeks    Status Achieved    Target Date 12/06/20               PT Long Term Goals - 01/03/21 0956       PT LONG TERM GOAL #1   Title Demonstrate improved left cervical rotation to 25% restriction to improve comfort and safety when driving    Baseline 14% limitation when against gravity, 50% left rotation restriction when supine    Time 4    Period Weeks    Status Not Met      PT LONG TERM GOAL #2   Title Demonstrate 4/5 BUE strength to improve function for overhead activities    Baseline 4/5 gross strength, 8/10 pain with overhead movements    Time 4    Period Weeks    Status Not Met      PT LONG TERM GOAL #3   Title Demonstrate tandem stance x 30 sec without LOB to manifest improved static standing balance    Baseline 30 sec, no LOB    Time 4    Period Weeks  Status Achieved      PT LONG TERM GOAL #4   Title Pt will report no more than 3/10 pain in her upper back throughout the day, to improve general activity tolerance.     Baseline 3-8/10 pain    Time 4    Period Weeks    Status Not Met                    Plan - 01/03/21 1034     Clinical Impression Statement Pt with good HEP compliance. Unfortunately unable to ameliorate pain and discomfort in overhead movements. Some improvement in cervical rotation. Pt notes correlation between situational stress/anxiety and onset of upper back pain. Pt to D/C to medical care and HEP at this time    Personal Factors and Comorbidities Age;Time since onset of injury/illness/exacerbation;Past/Current Experience    Examination-Activity Limitations Bend;Carry;Lift;Reach Overhead;Locomotion Level;Transfers    Examination-Participation Restrictions Cleaning;Community Activity;Yard Work    [employer] Evolving/Moderate complexity    Rehab Potential Fair    PT Frequency 2x / week    PT Duration 4 weeks    PT Treatment/Interventions ADLs/Self Care Home Management;Electrical Stimulation;Gait training;Functional mobility training;Therapeutic activities;Therapeutic exercise;Balance training;Neuromuscular re-education;Manual techniques;Taping;Splinting;Dry needling    PT Next Visit Plan Review HEP for strengthening, has green t-band and door anchor, prone dumbell rows    Consulted and Agree with Plan of Care Patient             Patient will benefit from skilled therapeutic intervention in order to improve the following deficits and impairments:  Decreased activity tolerance, Decreased balance, Decreased endurance, Decreased range of motion, Decreased strength, Hypomobility, Increased fascial restricitons, Increased muscle spasms, Impaired perceived functional ability, Postural dysfunction, Pain  Visit Diagnosis: Myofascial pain syndrome of thoracic spine  Muscle weakness (generalized)  Other symptoms and signs involving the musculoskeletal system     Problem List Patient Active Problem List   Diagnosis Date Noted   Coronary artery disease involving native heart without angina pectoris 01/31/2020   Grief reaction  01/22/2020   Upper respiratory tract infection 05/30/2018   Dyspepsia    FH: colon cancer 09/18/2015   Osteoporosis 05/20/2015   Moderate single current episode of major depressive disorder ([place]) 02/18/2015   Status post unilateral knee replacement 07/02/2014   Lung nodule 02/06/2014   Hyperlipidemia 01/18/2014   S/P right THA, AA 05/09/2013   Endometrial polyp 02/28/2013   Osteoarthritis of right hip 02/15/2013   Chronic back pain 08/17/2012   Hypothyroid 08/17/2012   Osteopenia 08/17/2012   Hypersomnia 11/18/2011   Asthma, mild persistent 08/05/2011   TIA (transient ischemic attack) 08/05/2011   Pulmonary nodule 08/05/2011   SOB (shortness of breath) 01/05/2011    Toniann Fail, PT 01/03/2021, 10:37 AM  [facility] [address] [place], [place], [address] Phone: [phone]   Fax:  [phone]  Name: Jenna Contreras MRN: [id number] Date of Birth: [birth year]

## 2021-01-10 ENCOUNTER — Telehealth: Payer: Self-pay | Admitting: Family Medicine

## 2021-01-10 ENCOUNTER — Other Ambulatory Visit: Payer: Self-pay | Admitting: Family Medicine

## 2021-01-10 DIAGNOSIS — M62830 Muscle spasm of back: Secondary | ICD-10-CM | POA: Diagnosis not present

## 2021-01-10 DIAGNOSIS — Z981 Arthrodesis status: Secondary | ICD-10-CM | POA: Diagnosis not present

## 2021-01-10 DIAGNOSIS — M41125 Adolescent idiopathic scoliosis, thoracolumbar region: Secondary | ICD-10-CM | POA: Diagnosis not present

## 2021-01-10 DIAGNOSIS — M542 Cervicalgia: Secondary | ICD-10-CM | POA: Diagnosis not present

## 2021-01-10 DIAGNOSIS — G8929 Other chronic pain: Secondary | ICD-10-CM | POA: Diagnosis not present

## 2021-01-10 NOTE — Telephone Encounter (Signed)
Please advise. Thank you

## 2021-01-10 NOTE — Telephone Encounter (Signed)
Pt asked to have her Zanaflex 90days refilled through [facility]. Has appointment 11/28. Please advise  [phone]

## 2021-01-12 ENCOUNTER — Other Ambulatory Visit: Payer: Self-pay | Admitting: Family Medicine

## 2021-01-12 MED ORDER — TIZANIDINE HCL 2 MG PO TABS: ORAL_TABLET | ORAL | 2 refills | Status: DC

## 2021-01-12 NOTE — Telephone Encounter (Signed)
Completed as requested

## 2021-01-20 ENCOUNTER — Encounter: Payer: Self-pay | Admitting: Primary Care

## 2021-01-20 ENCOUNTER — Other Ambulatory Visit: Payer: Self-pay

## 2021-01-20 ENCOUNTER — Ambulatory Visit (INDEPENDENT_AMBULATORY_CARE_PROVIDER_SITE_OTHER): Payer: Medicare Other | Admitting: Primary Care

## 2021-01-20 DIAGNOSIS — F4321 Adjustment disorder with depressed mood: Secondary | ICD-10-CM | POA: Diagnosis not present

## 2021-01-20 DIAGNOSIS — J4520 Mild intermittent asthma, uncomplicated: Secondary | ICD-10-CM

## 2021-01-20 DIAGNOSIS — R911 Solitary pulmonary nodule: Secondary | ICD-10-CM
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
## 2021-01-20 NOTE — Assessment & Plan Note (Addendum)
-   CT Chest imaging in November 2021 showed scattered small solid pulmonary nodules in both lungs which were stable and considered benign. No further follow-up needed unless symptoms change

## 2021-01-20 NOTE — Assessment & Plan Note (Signed)
-   Appears resolved; By the time she was contacted by behavioral health, she no longer needed them. Denies SI

## 2021-01-20 NOTE — Assessment & Plan Note (Signed)
-   Stable interval; No acute respiratory complaints or recent exacerbations. No SABA use. ACT score 23.  - Maintained PRN flovent and albuterol hfa - Follow-up in 1 year or sooner if needed

## 2021-01-20 NOTE — Patient Instructions (Addendum)
Recommendations: - Use Flovent 2 puffs twice daily as needed for cough/chest tightness or wheezing  - Use albuterol 2 puffs every 4-6 hours for breakthrough shortness of breath/wheezing   Follow-up: - 1 year with [name] or Eustaquio Maize NP

## 2021-01-21 ENCOUNTER — Other Ambulatory Visit: Payer: Self-pay | Admitting: Family Medicine

## 2021-01-21 NOTE — Telephone Encounter (Signed)
[facility] called and stated that patient's previous pharmacy, [facility], no longer takes her insurance. They requested that the prescription that was just sent to [facility] be cancelled and sent to [facility].  Pharmacy#  [phone] PT#:  [phone]

## 2021-01-22 NOTE — Telephone Encounter (Signed)
I have no idea how to do this I am okay with doing a refill of lorazepam but the hydrocodone I do not recommend refilling until she follows up Any suggestions?

## 2021-01-24 ENCOUNTER — Ambulatory Visit: Payer: Medicare Other | Admitting: Gastroenterology

## 2021-01-27 MED ORDER — HYDROCODONE-ACETAMINOPHEN 10-325 MG PO TABS: ORAL_TABLET | ORAL | 0 refills | Status: DC

## 2021-01-27 MED ORDER — LORAZEPAM 0.5 MG PO TABS: ORAL_TABLET | ORAL | 0 refills | Status: DC

## 2021-01-28 ENCOUNTER — Other Ambulatory Visit: Payer: Self-pay | Admitting: Family Medicine

## 2021-01-28 ENCOUNTER — Telehealth: Payer: Self-pay | Admitting: Family Medicine

## 2021-01-28 MED ORDER — HYDROCODONE-ACETAMINOPHEN 10-325 MG PO TABS: ORAL_TABLET | ORAL | 0 refills | Status: DC

## 2021-01-28 MED ORDER — LORAZEPAM 0.5 MG PO TABS: ORAL_TABLET | ORAL | 0 refills | Status: DC

## 2021-01-28 NOTE — Telephone Encounter (Signed)
Patient is requesting a standing prescription for her hydrocodone and lorazepam 0.5 mg she had to switch from [facility] to [facility] in [place] because  [facility] no longer took her insurance.so she sent back medication because it was too expensive.Patient states is now using [facility] in [place] and needing the rest of her refill call in she states its cheaper to get 90 day supply than 30 days. Please advise

## 2021-01-28 NOTE — Telephone Encounter (Signed)
First of all we are not doing a 90-day prescription on hydrocodone or lorazepam Maximum is 30 days at a time We will be sending in a limited amount to cover her until her office visit later next week

## 2021-02-03 ENCOUNTER — Encounter: Payer: Self-pay | Admitting: Family Medicine

## 2021-02-03 ENCOUNTER — Other Ambulatory Visit: Payer: Self-pay

## 2021-02-03 ENCOUNTER — Ambulatory Visit (INDEPENDENT_AMBULATORY_CARE_PROVIDER_SITE_OTHER): Payer: Medicare Other | Admitting: Family Medicine

## 2021-02-03 VITALS — BP 138/72 | Temp 97.4°F | Wt 115.8 lb

## 2021-02-03 DIAGNOSIS — Z889 Allergy status to unspecified drugs, medicaments and biological substances status: Secondary | ICD-10-CM | POA: Diagnosis not present

## 2021-02-03 DIAGNOSIS — M549 Dorsalgia, unspecified: Secondary | ICD-10-CM | POA: Diagnosis not present

## 2021-02-03 MED ORDER — AMPICILLIN 500 MG PO CAPS
ORAL_CAPSULE | ORAL | 0 refills | Status: DC
Start: 2021-02-03 — End: 2021-05-05

## 2021-02-03 MED ORDER — HYDROCODONE-ACETAMINOPHEN 10-325 MG PO TABS: ORAL_TABLET | ORAL | 0 refills | Status: DC

## 2021-02-03 MED ORDER — LORAZEPAM 0.5 MG PO TABS: ORAL_TABLET | ORAL | 3 refills | Status: DC

## 2021-02-03 NOTE — Progress Notes (Signed)
Subjective:    Patient ID: Jenna Contreras, female    DOB: 1946/01/01, 75 y.o.   MRN: [id number]  HPI Pt here for follow up. Pt is seeing pain management at [place] also seeing psych at [facility]. Dermatology at [place] had mammogram and gyn visit recently this year. Pt has new spine doctor. Pt is going to have back surgery to take out some rods and screws. Pt needs prescription for prophylactic penicillin today. Pt states her allergist states she is no longer allergic to penicillin.   This patient was seen today for chronic pain  The medication list was reviewed and updated.  Location of Pain for which the patient has been treated with regarding narcotics: She has pain throughout her mid back and upper back she has had previous surgery with several rods placed  Onset of this pain: She has had this pain for years   -Compliance with medication: She relates compliance with her medicine is tried to minimize her pain medicine  - Number patient states they take daily: She states most days she takes 1 maybe 2/day  -when was the last dose patient took?  Earlier this week  The patient was advised the importance of maintaining medication and not using illegal substances with these.  Here for refills and follow up  The patient was educated that we can provide 3 monthly scripts for their medication, it is their responsibility to follow the instructions.  Side effects or complications from medications: She denies side effects  Patient is aware that pain medications are meant to minimize the severity of the pain to allow their pain levels to improve to allow for better function. They are aware of that pain medications cannot totally remove their pain.  Due for UDT ( at least once per year) : She will be due on her next visit  Scale of 1 to 10 ( 1 is least 10 is most) Your pain level without the medicine: 7 Your pain level with medication 3  Scale 1 to 10 ( 1-helps very little, 10  helps very well) How well does your pain medication reduce your pain so you can function better through out the day?  7  Quality of the pain: Constant aching pain discomfort  Persistence of the pain: Persistent always  Modifying factors: Worse with certain activities      Review of Systems     Objective:   Physical Exam She has a very stiff back as evidenced by her previous surgery with rods being placed lungs clear heart regular pulse normal       Assessment & Plan:  The patient was seen in followup for chronic pain. A review over at their current pain status was discussed. Drug registry was checked. Prescriptions were given.  Regular follow-up recommended. Discussion was held regarding the importance of compliance with medication as well as pain medication contract.  Patient was informed that medication may cause drowsiness and should not be combined  with other medications/alcohol or street drugs. If the patient feels medication is causing altered alertness then do not drive or operate dangerous equipment.  Should be noted that the patient appears to be meeting appropriate use of opioids and response.  Evidenced by improved function and decent pain control without significant side effects and no evidence of overt aberrancy issues.  Upon discussion with the patient today they understand that opioid therapy is optional and they feel that the pain has been refractory to reasonable conservative measures and is  significant and affecting quality of life enough to warrant ongoing therapy and wishes to continue opioids.  Refills were provided.  This is a very unusual case for which she is seen multiple specialists summer trying radioablation along with injections along with physical therapy  She does use muscle relaxers occasionally but only when she is at home and in bed She takes pain medicine infrequently no more than twice per day other days less than that  She does take  benzodiazepine in the evening because of anxiety and insomnia has been on this for years but states she does not take it at the time of her pain meds  She also relates recently saw allergist who told her that she is not allergic to penicillin and therefore this was removed from her allergy list.  She was given ampicillin for prophylaxis before dental procedure  30 minutes spent with patient between chart review documenting in time spent with patient  Follow-up within 3 months

## 2021-02-07 DIAGNOSIS — Z981 Arthrodesis status: Secondary | ICD-10-CM | POA: Diagnosis not present

## 2021-02-07 DIAGNOSIS — M542 Cervicalgia: Secondary | ICD-10-CM | POA: Diagnosis not present

## 2021-02-07 DIAGNOSIS — M62830 Muscle spasm of back: Secondary | ICD-10-CM | POA: Diagnosis not present

## 2021-02-07 DIAGNOSIS — G8929 Other chronic pain: Secondary | ICD-10-CM | POA: Diagnosis not present

## 2021-02-20 DIAGNOSIS — M1711 Unilateral primary osteoarthritis, right knee: Secondary | ICD-10-CM | POA: Diagnosis not present

## 2021-02-20 DIAGNOSIS — M7061 Trochanteric bursitis, right hip: Secondary | ICD-10-CM | POA: Diagnosis not present

## 2021-02-20 DIAGNOSIS — M25551 Pain in right hip: Secondary | ICD-10-CM | POA: Diagnosis not present

## 2021-02-22 DIAGNOSIS — M7061 Trochanteric bursitis, right hip: Secondary | ICD-10-CM | POA: Diagnosis not present

## 2021-02-22 DIAGNOSIS — M1711 Unilateral primary osteoarthritis, right knee: Secondary | ICD-10-CM | POA: Diagnosis not present

## 2021-02-26 ENCOUNTER — Telehealth: Payer: Self-pay | Admitting: Primary Care

## 2021-02-26 NOTE — Telephone Encounter (Signed)
Pt calling to get a message to nurse for [facility], pt started yesterday at 4am with "severe rhinitis" and it has it has not subsided.  Pt states she has a post-nasal drip that started the same time. Pt states last year BW wrote for a nebulizer, but pt has never used it. Pt was told not to use it unless she needs it. Pt feels like she needs it. Pt needs advice on what to do, very nervous. Please advise [phone]

## 2021-02-26 NOTE — Telephone Encounter (Signed)
Attempted to call pt but unable to reach. Left message for her to return call. 

## 2021-02-26 NOTE — Telephone Encounter (Signed)
Called and spoke with pt letting her know that if she felt like she needed to use her nebulizer that it was okay for her to use it as she can use it every 6 hours as needed. Pt verbalized understanding. While speaking with pt, she stated that she has had problems with her nose running as well as congestion and wanted any recommendations to help with that. Stated to pt that she could use flonase nasal spray to help with her nose and also stated that she could use mucinex to help with congestion. Stated to pt if she started having any discolored mucus or if she started running a temp to call the office back and she verbalized understanding. Nothing further needed.

## 2021-02-27 ENCOUNTER — Telehealth: Payer: Self-pay

## 2021-02-27 ENCOUNTER — Other Ambulatory Visit: Payer: Self-pay

## 2021-02-27 ENCOUNTER — Ambulatory Visit (INDEPENDENT_AMBULATORY_CARE_PROVIDER_SITE_OTHER): Payer: Medicare Other | Admitting: Primary Care

## 2021-02-27 DIAGNOSIS — J00 Acute nasopharyngitis [common cold]: Secondary | ICD-10-CM | POA: Diagnosis not present

## 2021-02-27 DIAGNOSIS — U071 COVID-19: Secondary | ICD-10-CM

## 2021-02-27 DIAGNOSIS — J4521 Mild intermittent asthma with (acute) exacerbation: Secondary | ICD-10-CM

## 2021-02-27 MED ORDER — PREDNISONE 20 MG PO TABS: 20.0000 mg | ORAL_TABLET | Freq: Every day | ORAL | 0 refills | Status: DC

## 2021-02-27 MED ORDER — MOLNUPIRAVIR EUA 200MG CAPSULE: 4.0000 | ORAL_CAPSULE | Freq: Two times a day (BID) | ORAL | 0 refills | Status: AC

## 2021-02-27 NOTE — Telephone Encounter (Signed)
The patient called back and she is having some shortness of breath, cough and congestion. She has been using the nebulizer and some Flonase.   She reports that she tested positive for Covid 02/26/2021 and is still using her nebulizer and wants to know if you have any other recommendations. She reports that the only thing that has helped in the past was the "Original Sudafed".  Please advise.   Patient is requesting something for Covid as well. Please advise.

## 2021-02-27 NOTE — Telephone Encounter (Signed)
Can any providers ([facility] or MD) do a sick video visit today?

## 2021-02-27 NOTE — Telephone Encounter (Signed)
Phone call for telephone visit. Left voicemail for pt to call office back
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
## 2021-02-27 NOTE — Telephone Encounter (Signed)
If no openings please put her on my schedule tomorrow or she can go to [facility]

## 2021-02-27 NOTE — Telephone Encounter (Signed)
Called and spoke with pt about the info from [place] and she verbalized understanding. Appt has been scheduled for pt for video visit today at 2:30 with CY. Nothing further needed.

## 2021-03-03 ENCOUNTER — Emergency Department (HOSPITAL_COMMUNITY): Payer: Medicare Other

## 2021-03-03 ENCOUNTER — Telehealth: Payer: Self-pay | Admitting: Adult Health

## 2021-03-03 ENCOUNTER — Other Ambulatory Visit: Payer: Self-pay

## 2021-03-03 ENCOUNTER — Emergency Department (HOSPITAL_COMMUNITY)
Admission: EM | Admit: 2021-03-03 | Discharge: 2021-03-03 | Disposition: A | Discharge status: Home / Self Care | Payer: Medicare Other | Attending: Emergency Medicine | Admitting: Emergency Medicine

## 2021-03-03 DIAGNOSIS — Z85820 Personal history of malignant melanoma of skin: Secondary | ICD-10-CM | POA: Insufficient documentation

## 2021-03-03 DIAGNOSIS — U071 COVID-19: Secondary | ICD-10-CM | POA: Diagnosis not present

## 2021-03-03 DIAGNOSIS — I4891 Unspecified atrial fibrillation: Secondary | ICD-10-CM | POA: Diagnosis not present

## 2021-03-03 DIAGNOSIS — J4520 Mild intermittent asthma, uncomplicated: Secondary | ICD-10-CM | POA: Insufficient documentation

## 2021-03-03 DIAGNOSIS — Z96641 Presence of right artificial hip joint: Secondary | ICD-10-CM | POA: Insufficient documentation

## 2021-03-03 DIAGNOSIS — Z7951 Long term (current) use of inhaled steroids: Secondary | ICD-10-CM | POA: Insufficient documentation

## 2021-03-03 DIAGNOSIS — R0689 Other abnormalities of breathing: Secondary | ICD-10-CM | POA: Diagnosis not present

## 2021-03-03 DIAGNOSIS — R0789 Other chest pain: Secondary | ICD-10-CM | POA: Diagnosis not present

## 2021-03-03 DIAGNOSIS — Z79899 Other long term (current) drug therapy: Secondary | ICD-10-CM | POA: Diagnosis not present

## 2021-03-03 DIAGNOSIS — I2510 Atherosclerotic heart disease of native coronary artery without angina pectoris: Secondary | ICD-10-CM | POA: Diagnosis not present

## 2021-03-03 DIAGNOSIS — Z743 Need for continuous supervision: Secondary | ICD-10-CM | POA: Diagnosis not present

## 2021-03-03 DIAGNOSIS — E039 Hypothyroidism, unspecified: Secondary | ICD-10-CM | POA: Insufficient documentation

## 2021-03-03 DIAGNOSIS — Z87891 Personal history of nicotine dependence: Secondary | ICD-10-CM | POA: Insufficient documentation

## 2021-03-03 DIAGNOSIS — R079 Chest pain, unspecified: Secondary | ICD-10-CM | POA: Diagnosis not present

## 2021-03-03 DIAGNOSIS — Z91040 Latex allergy status: Secondary | ICD-10-CM | POA: Diagnosis not present

## 2021-03-03 DIAGNOSIS — R0602 Shortness of breath: Secondary | ICD-10-CM | POA: Diagnosis present

## 2021-03-03 LAB — BASIC METABOLIC PANEL
Anion gap: 9 (ref 5–15)
BUN: 24 mg/dL — ABNORMAL HIGH (ref 8–23)
CO2: 22 mmol/L (ref 22–32)
Calcium: 9 mg/dL (ref 8.9–10.3)
Chloride: 105 mmol/L (ref 98–111)
Creatinine, Ser: 0.61 mg/dL (ref 0.44–1.00)
GFR, Estimated: >60 mL/min (ref >60)
Glucose, Bld: 101 mg/dL — ABNORMAL HIGH (ref 70–99)
Potassium: 4 mmol/L (ref 3.5–5.1)
Sodium: 136 mmol/L (ref 135–145)

## 2021-03-03 LAB — CBC
HCT: 38.7 % (ref 36.0–46.0)
Hemoglobin: 12.9 g/dL (ref 12.0–15.0)
MCH: 30.5 pg (ref 26.0–34.0)
MCHC: 33.3 g/dL (ref 30.0–36.0)
MCV: 91.5 fL (ref 80.0–100.0)
Platelets: 297 10*3/uL (ref 150–400)
RBC: 4.23 MIL/uL (ref 3.87–5.11)
RDW: 13.7 % (ref 11.5–15.5)
WBC: 8.3 10*3/uL (ref 4.0–10.5)
nRBC: 0 % (ref 0.0–0.2)

## 2021-03-03 LAB — TROPONIN I (HIGH SENSITIVITY)
Troponin I (High Sensitivity): 12 ng/L (ref <18)
Troponin I (High Sensitivity): 12 ng/L (ref <18)

## 2021-03-03 NOTE — ED Triage Notes (Signed)
Pt BIB EMS due to having episodes SOB that last about 30-60 minutes. Pt was diagnosed with COVID last wednesday. Pt endorse chest pain and mild nausea. Per pt, she felt like her heart rate was irregular during episodes.

## 2021-03-03 NOTE — Telephone Encounter (Signed)
Thanks

## 2021-03-03 NOTE — ED Provider Notes (Signed)
[facility] EMERGENCY DEPARTMENT Provider Note   CSN: [id number] Arrival date & time: 03/03/21  1817     History Chief Complaint  Patient presents with   Shortness of Breath   Chest Pain    Jenna Contreras is a 75 y.o. female.   Shortness of Breath Associated symptoms: chest pain   Associated symptoms: no abdominal pain and no fever   Chest Pain Associated symptoms: fatigue and shortness of breath   Associated symptoms: no abdominal pain, no back pain, no fever and no numbness   Patient presented shortness of breath and chest pain.  Diagnosed with COVID almost a week ago.  Has been on an antiviral although patient cannot tell me which 1.  Had been doing well but was fatigued.  States today she developed episodes of chest pain.  Anterior chest.  Sharp.  Had some last night and some again today.  States she would feel her heart racing with these episodes.  After the episode she would feel back to baseline of just the fatigue.  Some shortness of breath of the episodes.  Has had a little bit of a cough.  No nausea or vomiting.    Past Medical History:  Diagnosis Date   Anxiety    Arthritis    Arthritis    Asthma    Bipolar 2 disorder ([place])    Cancer (HCC)    Basal Cell   Constipation    Depression    Fibromyalgia    GERD (gastroesophageal reflux disease)    Heart murmur    As small child    History of gout    History of skin cancer    HNP (herniated nucleus pulposus with myelopathy), thoracic    Hypothyroidism    Left eye injury    In ED 06/17/2014    Low BP    Migraines    OCD (obsessive compulsive disorder)    Osteoporosis    Psoriasis    Scoliosis     Patient Active Problem List   Diagnosis Date Noted   Coronary artery disease involving native heart without angina pectoris 01/31/2020   Grief reaction 01/22/2020   Upper respiratory tract infection 05/30/2018   Dyspepsia    FH: colon cancer 09/18/2015   Osteoporosis 05/20/2015   Moderate single  current episode of major depressive disorder ([place]) 02/18/2015   Status post unilateral knee replacement 07/02/2014   Asthma, mild intermittent 02/06/2014   Lung nodule 02/06/2014   Hyperlipidemia 01/18/2014   S/P right THA, AA 05/09/2013   Endometrial polyp 02/28/2013   Osteoarthritis of right hip 02/15/2013   Chronic back pain 08/17/2012   Hypothyroid 08/17/2012   Osteopenia 08/17/2012   Hypersomnia 11/18/2011   TIA (transient ischemic attack) 08/05/2011   Pulmonary nodule 08/05/2011   SOB (shortness of breath) 01/05/2011    Past Surgical History:  Procedure Laterality Date   Arthroscopic left knee surgery     BIOPSY  10/28/2017   Procedure: BIOPSY;  Surgeon: Danie Binder, MD;  Location: [facility] SUITE;  Service: Endoscopy;;  duodenal biopsy , gastric biopsy    DILATION AND CURETTAGE OF UTERUS  x3   ESOPHAGOGASTRODUODENOSCOPY (EGD) WITH PROPOFOL N/A 10/28/2017   Procedure: ESOPHAGOGASTRODUODENOSCOPY (EGD) WITH PROPOFOL;  Surgeon: Danie Binder, MD;  Location: [facility] ENDO SUITE;  Service: Endoscopy;  Laterality: N/A;  8:15am   HIP SURGERY Right 2015   iliac wings     KNEE SURGERY Bilateral 2015, 2016   MANDIBLE SURGERY     X  2   PARTIAL KNEE ARTHROPLASTY Left 02/12/2014   Procedure: LEFT KNEE UNICOMPARTMENTAL MEDIALLY ARTHROPLASTY ;  Surgeon: Mauri Pole, MD;  Location: [facility] ORS;  Service: Orthopedics;  Laterality: Left;   PARTIAL KNEE ARTHROPLASTY Right 07/02/2014   Procedure: RIGHT UNI KNEE ARTHOPLASTY MEDIALLY;  Surgeon: Paralee Cancel, MD;  Location: [facility] ORS;  Service: Orthopedics;  Laterality: Right;   rod in spine     scoliosis    TONSILLECTOMY     TOTAL HIP ARTHROPLASTY Right 05/09/2013   Procedure: RIGHT TOTAL HIP ARTHROPLASTY ANTERIOR APPROACH;  Surgeon: Mauri Pole, MD;  Location: [facility] ORS;  Service: Orthopedics;  Laterality: Right;   TUBAL LIGATION     WRIST SURGERY       OB History   No obstetric history on file.     Family History  Problem Relation Age of Onset    Heart disease Mother    Diabetes Mother    Colon cancer Father 11   Heart disease Brother     Social History   Tobacco Use   Smoking status: Former    Packs/day: 2.00    Years: 19.00    Pack years: 38.00    Types: Cigarettes    Quit date: 03/10/1979    Years since quitting: 42.0   Smokeless tobacco: Never   Tobacco comments:    Quit in 1981  Vaping Use   Vaping Use: Never used  Substance Use Topics   Alcohol use: No    Alcohol/week: 0.0 standard drinks    Comment: no   Drug use: No    Home Medications Prior to Admission medications   Medication Sig Start Date End Date Taking? Authorizing Provider  albuterol (PROAIR HFA) 108 (90 Base) MCG/ACT inhaler USE 2 INHALATIONS EVERY 4 HOURS AS NEEDED Patient taking differently: Inhale 2 puffs into the lungs every 4 (four) hours as needed for wheezing. 07/18/20   Martyn Ehrich, NP  albuterol (PROVENTIL) (2.5 MG/3ML) 0.083% nebulizer solution Take 3 mLs (2.5 mg total) by nebulization every 6 (six) hours as needed for wheezing or shortness of breath. 05/14/20   Martyn Ehrich, NP  ampicillin (PRINCIPEN) 500 MG capsule Take 4 capsule po 30-60 min before procedure Patient not taking: Reported on 02/27/2021 02/03/21   Kathyrn Drown, MD  diclofenac (VOLTAREN) 75 MG EC tablet TAKE 1 TABLET TWICE A DAY 03/28/20   Luking, Elayne Snare, MD  fluticasone (FLONASE) 50 MCG/ACT nasal spray Place 2 sprays into both nostrils daily. 07/18/18   Kathyrn Drown, MD  fluticasone (FLOVENT HFA) 110 MCG/ACT inhaler Inhale 1-2 puffs into the lungs 2 (two) times daily. Patient not taking: Reported on 02/27/2021 07/18/20   Martyn Ehrich, NP  HYDROcodone-acetaminophen (NORCO) 10-325 MG tablet TAKE 1 TABLET BY MOUTH TWICE DAILY AS NEEDED FOR pain 02/03/21   Kathyrn Drown, MD  hydrOXYzine (VISTARIL) 25 MG capsule Take 1-2 capsules (25-50 mg total) by mouth 3 (three) times daily as needed for anxiety. Patient not taking: Reported on 02/27/2021 10/29/20    Charlett Blake, MD  levothyroxine (SYNTHROID) 75 MCG tablet Take one tablet po daily Patient taking differently: Take 75 mcg by mouth daily before breakfast. 10/17/20   Kathyrn Drown, MD  LORazepam (ATIVAN) 0.5 MG tablet Take one tablet po qd prn anxiety caution drowsiness not to take at same time as hydrocodone 02/03/21   Luking, Elayne Snare, MD  molnupiravir EUA (LAGEVRIO) 200 mg CAPS capsule Take 4 capsules (800 mg total) by mouth 2 (  two) times daily for 5 days. 02/27/21 03/04/21  Martyn Ehrich, NP  NONFORMULARY OR COMPOUNDED ITEM Apply topically. 12/10/20   [provider]  ondansetron (ZOFRAN) 4 MG tablet Take 1 tablet (4 mg total) by mouth every 6 (six) hours. Patient not taking: Reported on 02/27/2021 09/02/17   Kathyrn Drown, MD  Polyethyl Glycol-Propyl Glycol (SYSTANE ULTRA OP) Place 1 drop into both eyes at bedtime.    [provider]  predniSONE (DELTASONE) 20 MG tablet Take 1 tablet (20 mg total) by mouth daily with breakfast. 02/27/21   Martyn Ehrich, NP  rizatriptan (MAXALT) 10 MG tablet May repeat in 2 hours if needed Patient taking differently: Take 10 mg by mouth as needed for migraine. 03/18/20   Kathyrn Drown, MD  tiZANidine (ZANAFLEX) 2 MG tablet One po tid prn caution drowsiness 01/12/21   Kathyrn Drown, MD  trolamine salicylate (ASPERCREME) 10 % cream Apply 1 application topically as needed for muscle pain.    [provider]    Allergies    Betadine [povidone-iodine], Prednisone, Silvadene [silver sulfadiazine], Sulfa antibiotics, Latex, Wound dressing adhesive, Adhesive [tape], Betadine [povidone iodine], Lamictal [lamotrigine], Levaquin [levofloxacin in d5w], Tizanidine, Trazodone and nefazodone, Zyrtec [cetirizine], Baclofen, Benadryl [diphenhydramine], Cephalexin, Claritin [loratadine], Demerol [meperidine], Doxycycline, Erythromycin, Keflex [cephalexin], Other, and Tetracyclines & related  Review of Systems   Review of Systems   Constitutional:  Positive for fatigue. Negative for appetite change and fever.  HENT:  Negative for congestion.   Respiratory:  Positive for shortness of breath.   Cardiovascular:  Positive for chest pain.  Gastrointestinal:  Negative for abdominal pain.  Genitourinary:  Negative for dysuria.  Musculoskeletal:  Negative for back pain.  Neurological:  Negative for numbness.   Physical Exam Updated Vital Signs BP 120/78 (BP Location: Left Arm)    Pulse 62    Temp 98.3 F (36.8 C) (Oral)    Resp 20    SpO2 100%   Physical Exam Vitals and nursing note reviewed.  HENT:     Head: Normocephalic.  Cardiovascular:     Rate and Rhythm: Normal rate and regular rhythm.  Pulmonary:     Breath sounds: No wheezing or rhonchi.     Comments: Mildly harsh breath sounds without focal rales or rhonchi. Chest:     Chest wall: No tenderness.  Musculoskeletal:     Right lower leg: No edema.     Left lower leg: No edema.  Skin:    General: Skin is warm.     Capillary Refill: Capillary refill takes less than 2 seconds.  Neurological:     Mental Status: She is alert and oriented to person, place, and time.    ED Results / Procedures / Treatments   Labs (all labs ordered are listed, but only abnormal results are displayed) Labs Reviewed  BASIC METABOLIC PANEL - Abnormal; Notable for the following components:      Result Value   Glucose, Bld 101 (*)    BUN 24 (*)    All other components within normal limits  CBC  TROPONIN I (HIGH SENSITIVITY)  TROPONIN I (HIGH SENSITIVITY)    EKG None  Radiology DG Chest Portable 1 View  Result Date: 03/03/2021 CLINICAL DATA:  COVID chest pain EXAM: PORTABLE CHEST 1 VIEW COMPARISON:  09/22/2019 FINDINGS: Extensive spinal hardware. No focal opacity, pleural effusion, or pneumothorax. Stable cardiomediastinal silhouette. IMPRESSION: No active disease. Electronically Signed   By: Donavan Foil M.D.   On: 03/03/2021 19:20  Procedures Procedures    Medications Ordered in ED Medications - No data to display  ED Course  I have reviewed the triage vital signs and the nursing notes.  Pertinent labs & imaging results that were available during my care of the patient were reviewed by me and considered in my medical decision making (see chart for details).    MDM Rules/Calculators/A&P                         Patient with recent COVID infection.  Began about a week ago.  Did have episodes of sharp chest pain.  Come and go.  Not consistent.  Also potentially felt heart racing.  No episodes of tachycardia here.  EKG reassuring.  Troponin negative x2.  Chest x-ray reassuring.  Doubt pulmonary edema.  Doubt pulmonary embolism.  Doubt cardiac ischemia.  Appears stable for discharge home.  Follow-up as an outpatient as needed    Final Clinical Impression(s) / ED Diagnoses Final diagnoses:  Nonspecific chest pain  COVID-19    Rx / DC Orders ED Discharge Orders     None        Davonna Belling, MD 03/04/21 [id number]

## 2021-03-03 NOTE — Telephone Encounter (Signed)
Patient called in to on call service .  2 episodes of severe chest pain and tachycardia over the weekend. + Covid , not vaccinated. On antiviral .  No chest pain today .  Advised needs to go to ER for further evaluation of chest pain /tachycardia/palpitations . Advised this could be something more serious and needs workup . Advised of local ER options /urgent care , no current chest pain, advised to get someone to drive her.  She is agreeable.  Advised to call our office tomorrow for follow up visit in next couple of weeks  Please contact office for sooner follow up if symptoms do not improve or worsen or seek emergency care

## 2021-03-04 ENCOUNTER — Telehealth: Payer: Self-pay | Admitting: Primary Care

## 2021-03-05 ENCOUNTER — Other Ambulatory Visit: Payer: Self-pay | Admitting: *Deleted

## 2021-03-05 DIAGNOSIS — I2510 Atherosclerotic heart disease of native coronary artery without angina pectoris: Secondary | ICD-10-CM

## 2021-03-05 NOTE — Telephone Encounter (Signed)
Patient is returning a call. °

## 2021-03-05 NOTE — Telephone Encounter (Signed)
ATC patient went straight to voicemail, [facility]

## 2021-03-05 NOTE — Telephone Encounter (Signed)
Called and spoke with patient, she stated that the palpitations started after using the nebulizer.  Advised that the albuterol can increase heart rate.  She has never had this reaction before.  She stated that her temperature went up to 100 yesterday and she had taken hydrocodone with acetaminophen in it, her temperature was one degree higher today.  She says she is on day 8 of Covid and feels worse than on day one. She is eating/drinking ok.  She woke up with a HA today, she did not take anything for it, but it has gone away some.  Started coughing today, it is loose, it does not come all the way up.  She does not feel any congestion in her chest or in her head.  She has had good oxygen levels at home.  She would like a referral to [facility] cardiology.  Advised her to call us back if her sats drop below 88-90%, she is unable to eat/drink, has vomiting/diarrhea, sob or temperature of 101 or greater not controlled by Acetaminophen.  She verbalized understanding.  Referral placed.  Nothing further needed.

## 2021-03-11 ENCOUNTER — Other Ambulatory Visit: Payer: Self-pay | Admitting: Family Medicine

## 2021-03-11 ENCOUNTER — Telehealth: Payer: Self-pay | Admitting: Family Medicine

## 2021-03-11 MED ORDER — HYDROCODONE-ACETAMINOPHEN 10-325 MG PO TABS: ORAL_TABLET | ORAL | 0 refills | Status: DC

## 2021-03-11 NOTE — Telephone Encounter (Signed)
Under current guidelines these 2 medicines are not currently being prescribed to individuals I will stick with the lorazepam at the current dose but will not increase it because it is not considered safe with hydrocodone Will reassess hydrocodone to redo prescription  Is she stating that she is using the hydrocodone 3 times per day? Send message back to me after discussing the above thank you

## 2021-03-11 NOTE — Telephone Encounter (Signed)
Pain medication sent in, limited amount #30, needs to keep follow-up visit we can discuss pain management further at that time

## 2021-03-11 NOTE — Telephone Encounter (Signed)
Pt called and stated Dr Nicki Reaper needs to up her Hydrocodone to twice what it is and take it 2 - 3 times a day. She stated he also needs to up her Lorazepam dosage as he lowered that too. These being due to knee replacement coming up and her cat passed away. [facility]. [phone]

## 2021-03-11 NOTE — Telephone Encounter (Signed)
Patient states she used to take 3 tablets a day but was tapered down to once a day and is in excruciating knee pain and back pain. Patient states she needs surgery on her back and both knees and is currently walking with a walker- also on day 14 of [place] in [place]

## 2021-03-12 ENCOUNTER — Telehealth: Payer: Self-pay | Admitting: Primary Care

## 2021-03-12 MED ORDER — FLUTICASONE PROPIONATE 50 MCG/ACT NA SUSP: 2.0000 | Freq: Every day | NASAL | 3 refills | Status: DC

## 2021-03-12 NOTE — Telephone Encounter (Signed)
Called and spoke with patient who states that she is still feeling bad and this is day 15 of having covid. She is still having fatigue, palpitations, night sweats, post nasal drip, low temp 99.5 and is on day 3 of headache.   Beth please advise on any additional recommendations

## 2021-03-12 NOTE — Telephone Encounter (Signed)
Spoke to patient and relayed below message.  There is not a 30 minute slot available with any provider tomorrow or Friday.  She stated that she would got to UC.  Nothing further needed.

## 2021-03-12 NOTE — Telephone Encounter (Signed)
Patient notified and verbalized understanding. 

## 2021-03-12 NOTE — Telephone Encounter (Signed)
Needs in office visit with APP (or another provider that has 30 min slot) or needs to present to UC for eval if nothing available with [place]

## 2021-03-13 ENCOUNTER — Ambulatory Visit (HOSPITAL_COMMUNITY)
Admission: RE | Admit: 2021-03-13 | Discharge: 2021-03-13 | Disposition: A | Discharge status: Home / Self Care | Payer: Medicare Other | Source: Ambulatory Visit | Attending: Pulmonary Disease | Admitting: Pulmonary Disease

## 2021-03-13 ENCOUNTER — Telehealth: Payer: Self-pay | Admitting: Pulmonary Disease

## 2021-03-13 ENCOUNTER — Other Ambulatory Visit: Payer: Self-pay

## 2021-03-13 ENCOUNTER — Encounter: Payer: Self-pay | Admitting: Pulmonary Disease

## 2021-03-13 ENCOUNTER — Ambulatory Visit (INDEPENDENT_AMBULATORY_CARE_PROVIDER_SITE_OTHER): Payer: Medicare Other | Admitting: Pulmonary Disease

## 2021-03-13 VITALS — BP 120/68 | HR 86 | Temp 99.5°F | Ht 62.0 in | Wt 109.0 lb

## 2021-03-13 DIAGNOSIS — R052 Subacute cough: Secondary | ICD-10-CM

## 2021-03-13 DIAGNOSIS — R059 Cough, unspecified: Secondary | ICD-10-CM | POA: Diagnosis not present

## 2021-03-13 MED ORDER — AMOXICILLIN-POT CLAVULANATE 875-125 MG PO TABS: 1.0000 | ORAL_TABLET | Freq: Two times a day (BID) | ORAL | 0 refills | Status: DC

## 2021-03-13 NOTE — Progress Notes (Signed)
Chief Complaint  Patient presents with   Acute Visit    Covid + on 02/25/2021 Fatigue, night sweats, palpitations, post nasal drip, cough, cannot take a full deep breath, headache X4 days.    Has used nebulizer and took an antiviral.     History: She is followed by [name] for asthma.  Dx with COVID about 16 days ago.  Treated with molnupiravir.  Still having temp up to 37F.  Has fatigue, cough with clear sputum, diarrhea, headache.  She feels she is going in and out of a fib.  Has cardiology appointment.  BP 120/68    Pulse 86    Temp 99.5 F (37.5 C) (Oral)    Ht 5\' 2"  (1.575 m)    Wt 109 lb (49.4 kg)    SpO2 98%    BMI 19.94 kg/m    Appearance - well kempt   ENMT - no sinus tenderness, no oral exudate, no LAN, Mallampati 2 airway, no stridor  Respiratory - equal breath sounds bilaterally, no wheezing or rales  CV - s1s2 regular rate and rhythm, no murmurs  Ext - no clubbing, no edema  Skin - no rashes  Psych - normal mood and affect  Assessment/plan:  Acute bronchitis with recent COVID 19 infection and history of recurrent pneumonia. - will give course of augmentin - will arrange for chest xray - explained that she isn't a candidate for additional COVID therapy at this time, and some of her symptoms might be related to developing long COVID  Asthma. - don't think she needs additional prednisone - continue flovent, prn albuterol  Patient Instructions  Chest xray today  Augmentin 1 pill twice per day for 7 days  Follow up in 1 to 2 weeks with [name] or Derl Barrow   Time spent on day of evaluation: 36 minutes  Chesley Mires, MD [place] Pager - [phone] 03/13/2021, 10:40 AM

## 2021-03-13 NOTE — Patient Instructions (Signed)
Chest xray today  Augmentin 1 pill twice per day for 7 days  Follow up in 1 to 2 weeks with [name] or Derl Barrow

## 2021-03-13 NOTE — Telephone Encounter (Signed)
ATC patient to notify. Call was disconnected.

## 2021-03-13 NOTE — Telephone Encounter (Signed)
She can alternate ibuprofen 200 mg every 6 hours prn with tylenol.

## 2021-03-13 NOTE — Telephone Encounter (Signed)
Will route to Dr. Sood as an FYI 

## 2021-03-14 ENCOUNTER — Other Ambulatory Visit: Payer: Self-pay

## 2021-03-14 ENCOUNTER — Telehealth: Payer: Self-pay | Admitting: Pulmonary Disease

## 2021-03-14 DIAGNOSIS — R079 Chest pain, unspecified: Secondary | ICD-10-CM

## 2021-03-14 NOTE — Telephone Encounter (Signed)
Patient notified during separate encounter.

## 2021-03-14 NOTE — Telephone Encounter (Signed)
Patient called asking if she could have referral to [facility]. States he has had a referral from [place] before to [facility] but states she is concerned that she has myocarditis from having Covid.   [name] please advise

## 2021-03-14 NOTE — Telephone Encounter (Signed)
Order for referral placed. Notified patient. Nothing further needed.

## 2021-03-14 NOTE — Telephone Encounter (Signed)
Okay to put in referral to [facility] cardiology per patient request.  Will also route message to [name] as her primary pulmonary provider so he is aware of this request.

## 2021-03-20 ENCOUNTER — Encounter: Payer: Self-pay | Admitting: Family Medicine

## 2021-03-20 ENCOUNTER — Other Ambulatory Visit: Payer: Self-pay

## 2021-03-20 ENCOUNTER — Telehealth: Payer: Self-pay | Admitting: Family Medicine

## 2021-03-20 ENCOUNTER — Ambulatory Visit (INDEPENDENT_AMBULATORY_CARE_PROVIDER_SITE_OTHER): Payer: Medicare Other | Admitting: Family Medicine

## 2021-03-20 VITALS — BP 124/77 | Temp 96.8°F | Wt 112.0 lb

## 2021-03-20 DIAGNOSIS — M170 Bilateral primary osteoarthritis of knee: Secondary | ICD-10-CM | POA: Diagnosis not present

## 2021-03-20 DIAGNOSIS — U099 Post covid-19 condition, unspecified: Secondary | ICD-10-CM

## 2021-03-20 DIAGNOSIS — M546 Pain in thoracic spine: Secondary | ICD-10-CM

## 2021-03-20 DIAGNOSIS — R0609 Other forms of dyspnea: Secondary | ICD-10-CM | POA: Diagnosis not present

## 2021-03-20 DIAGNOSIS — G8929 Other chronic pain: Secondary | ICD-10-CM

## 2021-03-20 MED ORDER — HYDROCODONE-ACETAMINOPHEN 10-325 MG PO TABS: ORAL_TABLET | ORAL | 0 refills | Status: DC

## 2021-03-20 NOTE — Telephone Encounter (Signed)
Tried to contact the patient - phone kept hanging up unable to leave a message for patient to call back and schedule Medicare Annual Wellness Visit (AWV) in office.   If unable to come into the office for AWV,  please offer to do virtually or by telephone.  Last AWV: 03/18/2020  Please schedule at anytime with [facility].  40 minute appointment  Any questions, please contact me at [phone]

## 2021-03-20 NOTE — Progress Notes (Signed)
° °  Subjective:    Patient ID: Jenna Contreras, female    DOB: 05/06/1945, 76 y.o.   MRN: [id number]  HPI Pt having cough for 2 days. Pt states cough stopped 2 days ago. Pt states she has had a fever since 02/25/21. 03/01/21 COVID with heart palpation, shortness of breath and chest pain. ER 03/04/21. Saw cardiology in [place] that said it was not COVID related. [place] Cardiologist said she has myocarditis. Needs from completed to have home care assistance for when she has surgery(pt states she is needing multiple surgeries in the near future) I reviewed through the notes I do not feel myocarditis mention but certainly it is a possibility She does relate shortness of breath with activity and heart rate goes up She is not having any chest pain or discomfort   She has a history of back surgery with rod placement.  Has chronic back pain low back pain and bilateral knee pain as well as carpal tunnel.  She states that several specialist that she seen through [facility] are planning on doing back surgery and knee surgery. She has underlying depression and anxiety She lives at home She has a hard time taking care of her self She would need assistance after surgery for any of the surgeries but more so for knee surgery back surgery She would not be able to do simple ADLs such as fixing food housework shopping bathing but would be able to feed herself  Review of Systems     Objective:   Physical Exam  Lungs clear heart regular pulse normal      Assessment & Plan:  1. DOE (dyspnea on exertion) Her shortness of breath more likely is related to recent COVID infection I find no evidence of pneumonia going on currently I would not recommend a repeat chest x-ray.  I do think it is reasonable for her to see cardiology.  Her heart rate today is stable It is possible they may recommend an echo to look at left ventricular function  2. Chronic midline thoracic back pain She will be having  surgery in the future according to her.  When I reviewed the notes of the specialist they state that they may or may not be Surgery.  But it is true that if she has surgery she would need help for care afterwards and it would not be suitable for her to go back to the nursing home for further care.  [facility].  Forms were filled out.  3. Primary osteoarthritis of both knees Specialist states that they would need to do surgery.  Patient is uncertain which surgery would be first.  I have informed her since all of her specialist who would do surgery are within [facility] they need to interact together and discuss this and decide which surgery to do first.  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  I do not find evidence of myocarditis today.  Patient is concerned about that.  She is scheduled to see cardiology.  She will need cardiac clearance for her upcoming surgeries as well  50 minutes spent today with the patient filling out forms discussing with her reviewing over what is going on with her

## 2021-03-21 ENCOUNTER — Encounter: Payer: Self-pay | Admitting: Cardiology

## 2021-03-21 ENCOUNTER — Inpatient Hospital Stay: Payer: Medicare Other

## 2021-03-21 ENCOUNTER — Ambulatory Visit: Payer: Medicare Other | Admitting: Cardiology

## 2021-03-21 VITALS — BP 100/49 | HR 78 | Temp 98.4°F | Resp 17 | Ht 62.0 in | Wt 111.0 lb

## 2021-03-21 DIAGNOSIS — R002 Palpitations: Secondary | ICD-10-CM

## 2021-03-21 DIAGNOSIS — Z01810 Encounter for preprocedural cardiovascular examination: Secondary | ICD-10-CM

## 2021-03-21 DIAGNOSIS — I2510 Atherosclerotic heart disease of native coronary artery without angina pectoris: Secondary | ICD-10-CM | POA: Diagnosis not present

## 2021-03-21 DIAGNOSIS — R0609 Other forms of dyspnea: Secondary | ICD-10-CM

## 2021-03-21 NOTE — Progress Notes (Signed)
Primary Physician/Referring:  Kathyrn Drown, MD  Patient ID: Jenna Contreras, female    DOB: 05/16/1945, 76 y.o.   MRN: [id number]  Chief Complaint  Patient presents with   New Patient (Initial Visit)   Coronary Artery Disease   HPI:    Jenna Contreras  is a 76 y.o. Caucasian female with former smoker (38-pack-year history), hyperlipidemia, COPD and asthma, hypothyroidism, depression, anxiety, vitamin D deficiency, chronic pain.  Patient also had COVID-19 infection in 2022.  She was referred to our office for evaluation of coronary atherosclerosis noted on the CT scan. Patient notably also has upcoming knee surgeries for which PCP is requesting cardiac preoperative risk stratification.  Patient denies any chest pain but she does have chronic dyspnea on exertion.  She is more concerned about atrial fibrillation and post-COVID cardiomyopathy and also is concerned about coronary and aortic atherosclerosis.  No PND or orthopnea.  Denies symptoms of claudication.  She has been evaluated by orthopedics, needs metal rods from the back removed and also right knee replacement.  Patient was seen 03/03/2021 in the emergency department with complaints of chest pain.  At this time EKG was nonischemic, troponin negative x2, chest x-ray normal.  Patient has expressed concern for myocarditis following COVID-19 infection.  Past Medical History:  Diagnosis Date   Anxiety    Arthritis    Arthritis    Asthma    Bipolar 2 disorder (HCC)    Cancer (HCC)    Basal Cell   Constipation    Depression    Fibromyalgia    GERD (gastroesophageal reflux disease)    Heart murmur    As small child    History of gout    History of skin cancer    HNP (herniated nucleus pulposus with myelopathy), thoracic    Hypothyroidism    Left eye injury    In ED 06/17/2014    Low BP    Migraines    OCD (obsessive compulsive disorder)    Osteoporosis    Psoriasis    Scoliosis    Past Surgical History:   Procedure Laterality Date   Arthroscopic left knee surgery     BIOPSY  10/28/2017   Procedure: BIOPSY;  Surgeon: Danie Binder, MD;  Location: [facility];  Service: Endoscopy;;  duodenal biopsy , gastric biopsy    DILATION AND CURETTAGE OF UTERUS  x3   ESOPHAGOGASTRODUODENOSCOPY (EGD) WITH PROPOFOL N/A 10/28/2017   Procedure: ESOPHAGOGASTRODUODENOSCOPY (EGD) WITH PROPOFOL;  Surgeon: Danie Binder, MD;  Location: [facility];  Service: Endoscopy;  Laterality: N/A;  8:15am   HIP SURGERY Right 2015   iliac wings     KNEE SURGERY Bilateral 2015, 2016   MANDIBLE SURGERY     X 2   PARTIAL KNEE ARTHROPLASTY Left 02/12/2014   Procedure: LEFT KNEE UNICOMPARTMENTAL MEDIALLY ARTHROPLASTY ;  Surgeon: Mauri Pole, MD;  Location: [facility];  Service: Orthopedics;  Laterality: Left;   PARTIAL KNEE ARTHROPLASTY Right 07/02/2014   Procedure: RIGHT UNI KNEE ARTHOPLASTY MEDIALLY;  Surgeon: Paralee Cancel, MD;  Location: [facility];  Service: Orthopedics;  Laterality: Right;   rod in spine     scoliosis    TONSILLECTOMY     TOTAL HIP ARTHROPLASTY Right 05/09/2013   Procedure: RIGHT TOTAL HIP ARTHROPLASTY ANTERIOR APPROACH;  Surgeon: Mauri Pole, MD;  Location: [facility];  Service: Orthopedics;  Laterality: Right;   TUBAL LIGATION     WRIST SURGERY     Family History  Problem  Relation Age of Onset   Heart attack Mother 60       MULTIPLE   Heart disease Mother 62   Diabetes Mother    Colon cancer Father 63   Heart disease Brother 75    Social History   Tobacco Use   Smoking status: Former    Packs/day: 2.00    Years: 19.00    Pack years: 38.00    Types: Cigarettes    Quit date: 03/10/1979    Years since quitting: 42.0   Smokeless tobacco: Never   Tobacco comments:    Quit in 1981  Substance Use Topics   Alcohol use: No    Alcohol/week: 0.0 standard drinks    Comment: no   Marital Status: Widowed   ROS  Review of Systems  Cardiovascular:  Positive for dyspnea on exertion and  palpitations. Negative for chest pain, leg swelling, paroxysmal nocturnal dyspnea and syncope.  Gastrointestinal:  Negative for melena.   Objective  Blood pressure (!) 100/49, pulse 78, temperature 98.4 F (36.9 C), temperature source Temporal, resp. rate 17, height 5\' 2"  (1.575 m), weight 111 lb (50.3 kg), SpO2 97 %.  Vitals with BMI 03/21/2021 03/20/2021 03/13/2021  Height 5\' 2"  - 5\' 2"   Weight 111 lbs 112 lbs 109 lbs  BMI 20.3 78.58 85.02  Systolic 774 128 786  Diastolic 49 77 68  Pulse 78 - 86     Physical Exam Neck:     Vascular: No carotid bruit or JVD.  Cardiovascular:     Rate and Rhythm: Normal rate and regular rhythm.     Pulses: Intact distal pulses.     Heart sounds: Normal heart sounds. No murmur heard.   No gallop.  Pulmonary:     Effort: Pulmonary effort is normal.     Breath sounds: Normal breath sounds.  Abdominal:     General: Bowel sounds are normal.     Palpations: Abdomen is soft.  Musculoskeletal:        General: No swelling.    Laboratory examination:   Recent Labs    11/24/20 0435 03/03/21 1918  NA 139 136  K 3.5 4.0  CL 104 105  CO2 24 22  GLUCOSE 93 101*  BUN 15 24*  CREATININE 0.71 0.61  CALCIUM 9.5 9.0  GFRNONAA >60 >60   estimated creatinine clearance is 46.6 mL/min (by C-G formula based on SCr of 0.61 mg/dL).  CMP Latest Ref Rng & Units 03/03/2021 11/24/2020 03/18/2020  Glucose 70 - 99 mg/dL 101(H) 93 94  BUN 8 - 23 mg/dL 24(H) 15 13  Creatinine 0.44 - 1.00 mg/dL 0.61 0.71 0.83  Sodium 135 - 145 mmol/L 136 139 138  Potassium 3.5 - 5.1 mmol/L 4.0 3.5 4.2  Chloride 98 - 111 mmol/L 105 104 102  CO2 22 - 32 mmol/L 22 24 23   Calcium 8.9 - 10.3 mg/dL 9.0 9.5 9.9  Total Protein 6.5 - 8.1 g/dL - 6.6 7.0  Total Bilirubin 0.3 - 1.2 mg/dL - 1.0 0.5  Alkaline Phos 38 - 126 U/L - 57 80  AST 15 - 41 U/L - 17 12  ALT 0 - 44 U/L - 16 10   CBC Latest Ref Rng & Units 03/03/2021 11/24/2020 03/18/2020  WBC 4.0 - 10.5 K/uL 8.3 8.1 6.9  Hemoglobin  12.0 - 15.0 g/dL 12.9 12.8 13.3  Hematocrit 36.0 - 46.0 % 38.7 39.0 41.0  Platelets 150 - 400 K/uL 297 294 286    Lipid Panel  Lipid  profile 03/18/2020:  Total cholesterol 237, triglycerides 45, HDL 110, LDL 119.  Non-HDL cholesterol 127.  HEMOGLOBIN A1C Lab Results  Component Value Date   HGBA1C 5.5 10/05/2011   TSH Recent Labs    06/25/20 1136  TSH 2.500   Allergies   Allergies  Allergen Reactions   Betadine [Povidone-Iodine] Rash    rash   Prednisone Other (See Comments)    Suicidal ideation   Silvadene [Silver Sulfadiazine] Hives   Sulfa Antibiotics Hives and Rash   Latex Other (See Comments)    Irritates her skin   Wound Dressing Adhesive Other (See Comments)    Pulls skin off   Adhesive [Tape]     Pulls her skin off   Betadine [Povidone Iodine] Other (See Comments)    Sets her on fire, turns skin purple    Lamictal [Lamotrigine]    Levaquin [Levofloxacin In D5w]     Painful joints    Tizanidine    Trazodone And Nefazodone     Bad dreams   Zyrtec [Cetirizine]    Baclofen Rash   Benadryl [Diphenhydramine] Rash   Cephalexin Itching and Rash   Claritin [Loratadine] Rash   Demerol [Meperidine] Nausea And Vomiting   Doxycycline Itching and Rash   Erythromycin Rash   Keflex [Cephalexin] Rash   Other Rash    Pulls her skin off BLISTER   Tetracyclines & Related Itching and Rash    Medications Prior to Visit:   Outpatient Medications Prior to Visit  Medication Sig Dispense Refill   albuterol (PROAIR HFA) 108 (90 Base) MCG/ACT inhaler USE 2 INHALATIONS EVERY 4 HOURS AS NEEDED (Patient taking differently: Inhale 2 puffs into the lungs every 4 (four) hours as needed for wheezing.) 25.5 g 6   albuterol (PROVENTIL) (2.5 MG/3ML) 0.083% nebulizer solution Take 3 mLs (2.5 mg total) by nebulization every 6 (six) hours as needed for wheezing or shortness of breath. 75 mL 12   ampicillin (PRINCIPEN) 500 MG capsule Take 4 capsule po 30-60 min before procedure 4  capsule 0   diclofenac (VOLTAREN) 75 MG EC tablet TAKE 1 TABLET TWICE A DAY 180 tablet 1   fluticasone (FLONASE) 50 MCG/ACT nasal spray Place 2 sprays into both nostrils daily. 48 g 3   fluticasone (FLOVENT HFA) 110 MCG/ACT inhaler Inhale 1-2 puffs into the lungs 2 (two) times daily. 12 each 3   HYDROcodone-acetaminophen (NORCO) 10-325 MG tablet Take one tablet po TID prn 75 tablet 0   hydrOXYzine (VISTARIL) 25 MG capsule Take 1-2 capsules (25-50 mg total) by mouth 3 (three) times daily as needed for anxiety. 30 capsule 1   levothyroxine (SYNTHROID) 75 MCG tablet Take one tablet po daily (Patient taking differently: Take 75 mcg by mouth daily before breakfast.) 90 tablet 1   LORazepam (ATIVAN) 0.5 MG tablet Take one tablet po qd prn anxiety caution drowsiness not to take at same time as hydrocodone 30 tablet 3   NONFORMULARY OR COMPOUNDED ITEM Apply topically.     ondansetron (ZOFRAN) 4 MG tablet Take 1 tablet (4 mg total) by mouth every 6 (six) hours. 30 tablet 1   Polyethyl Glycol-Propyl Glycol (SYSTANE ULTRA OP) Place 1 drop into both eyes at bedtime.     predniSONE (DELTASONE) 20 MG tablet Take 1 tablet (20 mg total) by mouth daily with breakfast. 5 tablet 0   rizatriptan (MAXALT) 10 MG tablet May repeat in 2 hours if needed (Patient taking differently: Take 10 mg by mouth as needed for migraine.) 48 tablet  5   tiZANidine (ZANAFLEX) 2 MG tablet One po tid prn caution drowsiness 90 tablet 2   trolamine salicylate (ASPERCREME) 10 % cream Apply 1 application topically as needed for muscle pain.     No facility-administered medications prior to visit.   Final Medications at End of Visit    Current Meds  Medication Sig   albuterol (PROAIR HFA) 108 (90 Base) MCG/ACT inhaler USE 2 INHALATIONS EVERY 4 HOURS AS NEEDED (Patient taking differently: Inhale 2 puffs into the lungs every 4 (four) hours as needed for wheezing.)   albuterol (PROVENTIL) (2.5 MG/3ML) 0.083% nebulizer solution Take 3 mLs (2.5  mg total) by nebulization every 6 (six) hours as needed for wheezing or shortness of breath.   ampicillin (PRINCIPEN) 500 MG capsule Take 4 capsule po 30-60 min before procedure   diclofenac (VOLTAREN) 75 MG EC tablet TAKE 1 TABLET TWICE A DAY   fluticasone (FLONASE) 50 MCG/ACT nasal spray Place 2 sprays into both nostrils daily.   fluticasone (FLOVENT HFA) 110 MCG/ACT inhaler Inhale 1-2 puffs into the lungs 2 (two) times daily.   HYDROcodone-acetaminophen (NORCO) 10-325 MG tablet Take one tablet po TID prn   hydrOXYzine (VISTARIL) 25 MG capsule Take 1-2 capsules (25-50 mg total) by mouth 3 (three) times daily as needed for anxiety.   levothyroxine (SYNTHROID) 75 MCG tablet Take one tablet po daily (Patient taking differently: Take 75 mcg by mouth daily before breakfast.)   LORazepam (ATIVAN) 0.5 MG tablet Take one tablet po qd prn anxiety caution drowsiness not to take at same time as hydrocodone   NONFORMULARY OR COMPOUNDED ITEM Apply topically.   ondansetron (ZOFRAN) 4 MG tablet Take 1 tablet (4 mg total) by mouth every 6 (six) hours.   Polyethyl Glycol-Propyl Glycol (SYSTANE ULTRA OP) Place 1 drop into both eyes at bedtime.   predniSONE (DELTASONE) 20 MG tablet Take 1 tablet (20 mg total) by mouth daily with breakfast.   rizatriptan (MAXALT) 10 MG tablet May repeat in 2 hours if needed (Patient taking differently: Take 10 mg by mouth as needed for migraine.)   tiZANidine (ZANAFLEX) 2 MG tablet One po tid prn caution drowsiness   trolamine salicylate (ASPERCREME) 10 % cream Apply 1 application topically as needed for muscle pain.   Radiology:   High-resolution CT scan of the chest 01/18/2020: LAD and RCA atherosclerosis, thoracic aortic atherosclerosis, no aneurysm. No interstitial lung disease, scattered small solid pulmonary nodules.  Cardiac Studies:   None   EKG:   EKG 03/21/2021: Normal sinus rhythm at rate of 64 bpm, normal axis, no evidence of ischemia, normal EKG.  Assessment      ICD-10-CM   1. Preoperative cardiovascular examination  Z01.810 EKG 12-Lead    PCV MYOCARDIAL PERFUSION WITH LEXISCAN    2. Palpitations  R00.2 LONG TERM MONITOR (3-14 DAYS)    3. Atherosclerosis of native coronary artery of native heart without angina pectoris  I25.10 PCV MYOCARDIAL PERFUSION WITH LEXISCAN    4. Dyspnea on exertion  R06.09 PCV ECHOCARDIOGRAM COMPLETE    PCV MYOCARDIAL PERFUSION WITH LEXISCAN       There are no discontinued medications.  No orders of the defined types were placed in this encounter.   Recommendations:   Jenna Contreras is a 76 y.o. remote tobacco use disorder, COPD, degenerative spine disease, degenerative joint disease, referred to me for evaluation of palpitations that started after her recent COVID infection in December 2022 and also for preoperative cardiovascular work-up.  Symptoms of palpitation may be  related to PACs and PVCs with episodes of atrial tachycardia especially in view of underlying COPD.  However atrial fibrillation cannot be excluded.  She is also concerned about cardiomyopathy from recent COVID infection.  EKG is normal and physical exam is normal.  We will obtain an echocardiogram to evaluate her dyspnea and also palpitations we will perform 1 week of extended EKG monitoring.  With regard to preoperative cardiovascular restratification, she does have a underlying coronary and aortic atherosclerosis.  Will obtain Lexiscan nuclear stress test as patient is unable to exercise due to degenerative joint disease, needing right knee replacement, she is also needing rods from her lumbar spine removed soon.  I reviewed her labs, she has very mild hyperlipidemia, non-HDL is 127, in view of atherosclerosis although she is 76 years of age we could consider low-dose of a high intensity statin like Crestor 5 mg of Lipitor 10 mg for cardiovascular protection.  I discussed this on her next office visit after the test and any further  recommendations.  I thank Dr. Sallee Lange for sending this pleasant patient to me for evaluation.   Adrian Prows, MD, [facility] 03/21/2021, 12:42 PM Office: [phone] Fax: [phone] Pager: [phone]

## 2021-03-27 ENCOUNTER — Other Ambulatory Visit: Payer: Self-pay

## 2021-03-27 ENCOUNTER — Encounter: Payer: Self-pay | Admitting: Student

## 2021-03-27 ENCOUNTER — Ambulatory Visit: Payer: Medicare Other | Admitting: Student

## 2021-03-27 ENCOUNTER — Telehealth: Payer: Self-pay | Admitting: Cardiology

## 2021-03-27 ENCOUNTER — Ambulatory Visit: Payer: Medicare Other | Admitting: Internal Medicine

## 2021-03-27 ENCOUNTER — Ambulatory Visit (INDEPENDENT_AMBULATORY_CARE_PROVIDER_SITE_OTHER): Payer: Medicare Other | Admitting: Primary Care

## 2021-03-27 ENCOUNTER — Encounter: Payer: Self-pay | Admitting: Primary Care

## 2021-03-27 VITALS — BP 102/67 | HR 74 | Temp 98.2°F | Ht 62.0 in | Wt 111.0 lb

## 2021-03-27 DIAGNOSIS — I2510 Atherosclerotic heart disease of native coronary artery without angina pectoris: Secondary | ICD-10-CM | POA: Diagnosis not present

## 2021-03-27 DIAGNOSIS — R079 Chest pain, unspecified: Secondary | ICD-10-CM | POA: Diagnosis not present

## 2021-03-27 DIAGNOSIS — R052 Subacute cough: Secondary | ICD-10-CM

## 2021-03-27 DIAGNOSIS — R072 Precordial pain: Secondary | ICD-10-CM

## 2021-03-27 DIAGNOSIS — J4520 Mild intermittent asthma, uncomplicated: Secondary | ICD-10-CM

## 2021-03-27 MED ORDER — CHLORPHENIRAMINE MALEATE 4 MG PO TABS: 4.0000 mg | ORAL_TABLET | ORAL | 0 refills | Status: DC | PRN

## 2021-03-27 NOTE — Telephone Encounter (Signed)
Pt is calling requesting a call back regarding her monitor.

## 2021-03-27 NOTE — Progress Notes (Signed)
Reviewed and agree with assessment/plan. ° ° °Alexxus Sobh, MD °[facility] °03/27/2021, 7:24 PM °Pager:  [phone] ° °

## 2021-03-27 NOTE — Progress Notes (Signed)
Primary Physician/Referring:  Kathyrn Drown, MD  Patient ID: Jenna Contreras, female    DOB: 1945-04-13, 76 y.o.   MRN: [id number]  Chief Complaint  Patient presents with   Chest Pain   Follow-up   HPI:    Jenna Contreras  is a 76 y.o. Caucasian female with former smoker (38-pack-year history), hyperlipidemia, COPD and asthma, hypothyroidism, depression, anxiety, vitamin D deficiency, chronic pain.  Patient also had COVID-19 infection in 2022.  She was originally referred to our office for evaluation of coronary atherosclerosis noted on the CT scan. Patient notably also has upcoming knee surgeries for which PCP is requesting cardiac preoperative risk stratification.  Patient established care in our office 03/21/2021 at which time ordered cardiac monitor given palpitations as well as stress test and echocardiogram given cardiovascular risk factors and upcoming surgery.  Results of the aforementioned testing are pending.  However patient called our office today requesting an urgent visit with concerns of chest pain.  Patient reports last night she had an episode of chest pain while at rest lasting approximately 1 hour.  She had recurrence again of nonexertional chest pain this morning lasting 15 minutes.  She reports that since COVID-19 infection she has noticed when bending over she develops shortness of breath and palpitations.  These episodes are brief, but frequent.  Patient was seen 03/03/2021 in the emergency department with complaints of chest pain.  At this time EKG was nonischemic, troponin negative x2, chest x-ray normal.  Patient has expressed concern for myocarditis following COVID-19 infection.  Past Medical History:  Diagnosis Date   Anxiety    Arthritis    Arthritis    Asthma    Bipolar 2 disorder (HCC)    Cancer (HCC)    Basal Cell   Constipation    Depression    Fibromyalgia    GERD (gastroesophageal reflux disease)    Heart murmur    As small child     History of gout    History of skin cancer    HNP (herniated nucleus pulposus with myelopathy), thoracic    Hypothyroidism    Left eye injury    In ED 06/17/2014    Low BP    Migraines    OCD (obsessive compulsive disorder)    Osteoporosis    Psoriasis    Scoliosis    Past Surgical History:  Procedure Laterality Date   Arthroscopic left knee surgery     BIOPSY  10/28/2017   Procedure: BIOPSY;  Surgeon: Danie Binder, MD;  Location: [facility];  Service: Endoscopy;;  duodenal biopsy , gastric biopsy    DILATION AND CURETTAGE OF UTERUS  x3   ESOPHAGOGASTRODUODENOSCOPY (EGD) WITH PROPOFOL N/A 10/28/2017   Procedure: ESOPHAGOGASTRODUODENOSCOPY (EGD) WITH PROPOFOL;  Surgeon: Danie Binder, MD;  Location: [facility];  Service: Endoscopy;  Laterality: N/A;  8:15am   HIP SURGERY Right 2015   iliac wings     KNEE SURGERY Bilateral 2015, 2016   MANDIBLE SURGERY     X 2   PARTIAL KNEE ARTHROPLASTY Left 02/12/2014   Procedure: LEFT KNEE UNICOMPARTMENTAL MEDIALLY ARTHROPLASTY ;  Surgeon: Mauri Pole, MD;  Location: [facility];  Service: Orthopedics;  Laterality: Left;   PARTIAL KNEE ARTHROPLASTY Right 07/02/2014   Procedure: RIGHT UNI KNEE ARTHOPLASTY MEDIALLY;  Surgeon: Paralee Cancel, MD;  Location: [facility];  Service: Orthopedics;  Laterality: Right;   rod in spine     scoliosis    TONSILLECTOMY  TOTAL HIP ARTHROPLASTY Right 05/09/2013   Procedure: RIGHT TOTAL HIP ARTHROPLASTY ANTERIOR APPROACH;  Surgeon: Mauri Pole, MD;  Location: [facility];  Service: Orthopedics;  Laterality: Right;   TUBAL LIGATION     WRIST SURGERY     Family History  Problem Relation Age of Onset   Heart attack Mother 46       MULTIPLE   Heart disease Mother 58   Diabetes Mother    Colon cancer Father 20   Heart disease Brother 25    Social History   Tobacco Use   Smoking status: Former    Packs/day: 2.00    Years: 19.00    Pack years: 38.00    Types: Cigarettes    Quit date: 03/10/1979    Years  since quitting: 42.0   Smokeless tobacco: Never   Tobacco comments:    Quit in 1981  Substance Use Topics   Alcohol use: No    Alcohol/week: 0.0 standard drinks    Comment: no   Marital Status: Widowed   ROS  Review of Systems  Cardiovascular:  Positive for chest pain (2 episodes), dyspnea on exertion and palpitations. Negative for leg swelling, paroxysmal nocturnal dyspnea and syncope.  Gastrointestinal:  Negative for melena.   Objective  Blood pressure 102/67, pulse 74, temperature 98.2 F (36.8 C), temperature source Temporal, height 5\' 2"  (1.575 m), weight 111 lb (50.3 kg), SpO2 94 %.  Vitals with BMI 03/27/2021 03/27/2021 03/21/2021  Height 5\' 2"  - 5\' 2"   Weight 111 lbs 111 lbs 111 lbs  BMI 20.3 95.1 88.4  Systolic 166 063 016  Diastolic 67 70 49  Pulse 74 80 78    Orthostatic VS for the past 72 hrs (Last 3 readings):  Orthostatic BP Patient Position BP Location Cuff Size Orthostatic Pulse  03/27/21 1302 91/60 Standing Left Arm Normal 73  03/27/21 1301 104/61 Sitting Left Arm Normal 72  03/27/21 1300 110/69 Supine Left Arm Normal 65    Physical Exam Vitals reviewed.  Neck:     Vascular: No carotid bruit or JVD.  Cardiovascular:     Rate and Rhythm: Normal rate and regular rhythm.     Pulses: Intact distal pulses.     Heart sounds: Normal heart sounds. No murmur heard.   No gallop.  Pulmonary:     Effort: Pulmonary effort is normal.     Breath sounds: Normal breath sounds.  Musculoskeletal:     Right lower leg: No edema.     Left lower leg: No edema.    Laboratory examination:   Recent Labs    11/24/20 0435 03/03/21 1918  NA 139 136  K 3.5 4.0  CL 104 105  CO2 24 22  GLUCOSE 93 101*  BUN 15 24*  CREATININE 0.71 0.61  CALCIUM 9.5 9.0  GFRNONAA >60 >60   CrCl cannot be calculated (Patient's most recent lab result is older than the maximum 21 days allowed.).  CMP Latest Ref Rng & Units 03/03/2021 11/24/2020 03/18/2020  Glucose 70 - 99 mg/dL 101(H) 93  94  BUN 8 - 23 mg/dL 24(H) 15 13  Creatinine 0.44 - 1.00 mg/dL 0.61 0.71 0.83  Sodium 135 - 145 mmol/L 136 139 138  Potassium 3.5 - 5.1 mmol/L 4.0 3.5 4.2  Chloride 98 - 111 mmol/L 105 104 102  CO2 22 - 32 mmol/L 22 24 23   Calcium 8.9 - 10.3 mg/dL 9.0 9.5 9.9  Total Protein 6.5 - 8.1 g/dL - 6.6 7.0  Total  Bilirubin 0.3 - 1.2 mg/dL - 1.0 0.5  Alkaline Phos 38 - 126 U/L - 57 80  AST 15 - 41 U/L - 17 12  ALT 0 - 44 U/L - 16 10   CBC Latest Ref Rng & Units 03/03/2021 11/24/2020 03/18/2020  WBC 4.0 - 10.5 K/uL 8.3 8.1 6.9  Hemoglobin 12.0 - 15.0 g/dL 12.9 12.8 13.3  Hematocrit 36.0 - 46.0 % 38.7 39.0 41.0  Platelets 150 - 400 K/uL 297 294 286    Lipid Panel  Lipid profile 03/18/2020:  Total cholesterol 237, triglycerides 45, HDL 110, LDL 119.  Non-HDL cholesterol 127.  HEMOGLOBIN A1C Lab Results  Component Value Date   HGBA1C 5.5 10/05/2011   TSH Recent Labs    06/25/20 1136  TSH 2.500   Allergies   Allergies  Allergen Reactions   Betadine [Povidone-Iodine] Rash    rash   Prednisone Other (See Comments)    Suicidal ideation   Silvadene [Silver Sulfadiazine] Hives   Sulfa Antibiotics Hives and Rash   Latex Other (See Comments)    Irritates her skin   Wound Dressing Adhesive Other (See Comments)    Pulls skin off   Adhesive [Tape]     Pulls her skin off   Betadine [Povidone Iodine] Other (See Comments)    Sets her on fire, turns skin purple    Lamictal [Lamotrigine]    Levaquin [Levofloxacin In D5w]     Painful joints    Tizanidine    Trazodone And Nefazodone     Bad dreams   Zyrtec [Cetirizine]    Baclofen Rash   Benadryl [Diphenhydramine] Rash   Cephalexin Itching and Rash   Claritin [Loratadine] Rash   Demerol [Meperidine] Nausea And Vomiting   Doxycycline Itching and Rash   Erythromycin Rash   Keflex [Cephalexin] Rash   Other Rash    Pulls her skin off BLISTER   Tetracyclines & Related Itching and Rash    Medications Prior to Visit:   Outpatient  Medications Prior to Visit  Medication Sig Dispense Refill   albuterol (PROAIR HFA) 108 (90 Base) MCG/ACT inhaler USE 2 INHALATIONS EVERY 4 HOURS AS NEEDED (Patient taking differently: Inhale 2 puffs into the lungs every 4 (four) hours as needed for wheezing.) 25.5 g 6   albuterol (PROVENTIL) (2.5 MG/3ML) 0.083% nebulizer solution Take 3 mLs (2.5 mg total) by nebulization every 6 (six) hours as needed for wheezing or shortness of breath. 75 mL 12   ampicillin (PRINCIPEN) 500 MG capsule Take 4 capsule po 30-60 min before procedure 4 capsule 0   chlorpheniramine (CHLOR-TRIMETON) 4 MG tablet Take 1 tablet (4 mg total) by mouth every 4 (four) hours as needed for allergies (Cough). 30 tablet 0   diclofenac (VOLTAREN) 75 MG EC tablet TAKE 1 TABLET TWICE A DAY 180 tablet 1   fluticasone (FLONASE) 50 MCG/ACT nasal spray Place 2 sprays into both nostrils daily. 48 g 3   fluticasone (FLOVENT HFA) 110 MCG/ACT inhaler Inhale 1-2 puffs into the lungs 2 (two) times daily. 12 each 3   HYDROcodone-acetaminophen (NORCO) 10-325 MG tablet Take one tablet po TID prn 75 tablet 0   hydrOXYzine (VISTARIL) 25 MG capsule Take 1-2 capsules (25-50 mg total) by mouth 3 (three) times daily as needed for anxiety. 30 capsule 1   levothyroxine (SYNTHROID) 75 MCG tablet Take one tablet po daily (Patient taking differently: Take 75 mcg by mouth daily before breakfast.) 90 tablet 1   NONFORMULARY OR COMPOUNDED ITEM Apply topically.  ondansetron (ZOFRAN) 4 MG tablet Take 1 tablet (4 mg total) by mouth every 6 (six) hours. 30 tablet 1   Polyethyl Glycol-Propyl Glycol (SYSTANE ULTRA OP) Place 1 drop into both eyes at bedtime.     rizatriptan (MAXALT) 10 MG tablet May repeat in 2 hours if needed (Patient taking differently: Take 10 mg by mouth as needed for migraine.) 48 tablet 5   tiZANidine (ZANAFLEX) 2 MG tablet One po tid prn caution drowsiness 90 tablet 2   trolamine salicylate (ASPERCREME) 10 % cream Apply 1 application  topically as needed for muscle pain.     LORazepam (ATIVAN) 0.5 MG tablet Take one tablet po qd prn anxiety caution drowsiness not to take at same time as hydrocodone (Patient not taking: Reported on 03/27/2021) 30 tablet 3   predniSONE (DELTASONE) 20 MG tablet Take 1 tablet (20 mg total) by mouth daily with breakfast. (Patient not taking: Reported on 03/27/2021) 5 tablet 0   No facility-administered medications prior to visit.   Final Medications at End of Visit    Current Meds  Medication Sig   albuterol (PROAIR HFA) 108 (90 Base) MCG/ACT inhaler USE 2 INHALATIONS EVERY 4 HOURS AS NEEDED (Patient taking differently: Inhale 2 puffs into the lungs every 4 (four) hours as needed for wheezing.)   albuterol (PROVENTIL) (2.5 MG/3ML) 0.083% nebulizer solution Take 3 mLs (2.5 mg total) by nebulization every 6 (six) hours as needed for wheezing or shortness of breath.   ampicillin (PRINCIPEN) 500 MG capsule Take 4 capsule po 30-60 min before procedure   chlorpheniramine (CHLOR-TRIMETON) 4 MG tablet Take 1 tablet (4 mg total) by mouth every 4 (four) hours as needed for allergies (Cough).   diclofenac (VOLTAREN) 75 MG EC tablet TAKE 1 TABLET TWICE A DAY   fluticasone (FLONASE) 50 MCG/ACT nasal spray Place 2 sprays into both nostrils daily.   fluticasone (FLOVENT HFA) 110 MCG/ACT inhaler Inhale 1-2 puffs into the lungs 2 (two) times daily.   HYDROcodone-acetaminophen (NORCO) 10-325 MG tablet Take one tablet po TID prn   hydrOXYzine (VISTARIL) 25 MG capsule Take 1-2 capsules (25-50 mg total) by mouth 3 (three) times daily as needed for anxiety.   levothyroxine (SYNTHROID) 75 MCG tablet Take one tablet po daily (Patient taking differently: Take 75 mcg by mouth daily before breakfast.)   NONFORMULARY OR COMPOUNDED ITEM Apply topically.   ondansetron (ZOFRAN) 4 MG tablet Take 1 tablet (4 mg total) by mouth every 6 (six) hours.   Polyethyl Glycol-Propyl Glycol (SYSTANE ULTRA OP) Place 1 drop into both eyes at  bedtime.   rizatriptan (MAXALT) 10 MG tablet May repeat in 2 hours if needed (Patient taking differently: Take 10 mg by mouth as needed for migraine.)   tiZANidine (ZANAFLEX) 2 MG tablet One po tid prn caution drowsiness   trolamine salicylate (ASPERCREME) 10 % cream Apply 1 application topically as needed for muscle pain.   Radiology:   High-resolution CT scan of the chest 01/18/2020: LAD and RCA atherosclerosis, thoracic aortic atherosclerosis, no aneurysm. No interstitial lung disease, scattered small solid pulmonary nodules.  Cardiac Studies:   Echocardiogram, stress test, cardiac monitor pending  EKG:  03/27/2021: Sinus rhythm at a rate of 64 bpm.  Normal axis.  Poor R wave progression, cannot exclude anteroseptal infarct old.  No evidence of ischemia or underlying injury pattern.  Unchanged compared to EKG 02/11/2021.   03/21/2021: Normal sinus rhythm at rate of 64 bpm, normal axis, no evidence of ischemia, normal EKG.  Assessment  ICD-10-CM   1. Precordial pain  R07.2 EKG 12-Lead       There are no discontinued medications.  No orders of the defined types were placed in this encounter.   Recommendations:   Jenna Contreras is a 76 y.o. remote tobacco use disorder, COPD, degenerative spine disease, degenerative joint disease, originally referred to our office for evaluation of palpitations that started after her recent COVID infection in December 2022 and also for preoperative cardiovascular work-up.  Patient established care in our office 03/21/2021 at which time ordered cardiac monitor given palpitations as well as stress test and echocardiogram given cardiovascular risk factors and upcoming surgery.  Results of the aforementioned testing are pending.  However patient called our office today requesting an urgent visit with concerns of chest pain.  Patient's EKG is nonischemic and unchanged compared to previous.  Physical exam remained stable compared to previous office  visit.  Patient is currently chest pain-free and her symptoms were quite atypical.  Patient is currently wearing cardiac monitor and is scheduled for upcoming echocardiogram and stress test.  I have reassured patient and will not make changes at today's office visit, but encouraged her to keep upcoming appointments for further cardiac testing.  In regard to palpitations and shortness of breath with positional changes patient is borderline orthostatic in the office today, however she was asymptomatic.  Encourage patient to stay well-hydrated.   The recommendations pending results of cardiac testing.  She will keep previously scheduled appointment with Dr. Einar Gip.   Jenna Berthold, PA-C 03/27/2021, 1:12 PM Office: [phone]

## 2021-03-27 NOTE — Patient Instructions (Addendum)
CXR showed clear lungs Lungs were clear on exam   Recommendations: - Restart Flovent- take two puffs morning and evening (rinse mouth after use) - Use albuterol nebulizer every 6 hours (aim for 2-3 times a day) - For drainage/cough take chlorphentermine (sent RX) 4 mg every 4 hours as needed (may cause drowsiness so start at bedtime and see how you tolerate before trying during the day)  Follow-up: - 6-8 weeks with [name] or [facility]

## 2021-03-27 NOTE — Assessment & Plan Note (Addendum)
-   Patient experienced chest pain which lasted about an hour last night. No active cp during today's visit, VSS.  Currently wearing Holter monitor, has apt with cardiology today

## 2021-03-27 NOTE — Assessment & Plan Note (Addendum)
-   Increased dyspnea with exertion. Lungs were clear on exam. She is not consistently using Flovent HFA, advised she resume two puffs twice daily and use albuterol nebulizer q6 hours for shortness of breath. FU 6-8 weeks with [name] or sooner if needed.
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
## 2021-03-27 NOTE — Assessment & Plan Note (Addendum)
-   Post covid cough, dx Dec 2022 treated with molnupiravir. Cough improved while taking Augmentin. CXR showed no acute process. She can try taking chlorphentermine 4 mg every 4 hours as needed for PND/cough.

## 2021-04-04 ENCOUNTER — Ambulatory Visit: Payer: Medicare Other | Admitting: Physical Medicine & Rehabilitation

## 2021-04-07 NOTE — Telephone Encounter (Signed)
She needs to take only one Vit D

## 2021-04-07 NOTE — Telephone Encounter (Signed)
She is worried about toxicity and if she needs blood work or medical care ?

## 2021-04-07 NOTE — Telephone Encounter (Signed)
Patient called to say she is taking 2000 units of d3 , she said that she takes  6 pills a day and is wondering if this is what is making her sick.

## 2021-04-07 NOTE — Telephone Encounter (Signed)
Tell her to stop the Vit supplement and recheck in 3 months. There is nothing to be done, even if level is  high, we just have to wait for normal course

## 2021-04-08 ENCOUNTER — Ambulatory Visit (INDEPENDENT_AMBULATORY_CARE_PROVIDER_SITE_OTHER): Payer: Medicare Other | Admitting: Family Medicine

## 2021-04-08 ENCOUNTER — Other Ambulatory Visit: Payer: Self-pay

## 2021-04-08 ENCOUNTER — Encounter: Payer: Self-pay | Admitting: Family Medicine

## 2021-04-08 VITALS — Temp 97.9°F | Resp 70 | Wt 109.0 lb

## 2021-04-08 DIAGNOSIS — R0602 Shortness of breath: Secondary | ICD-10-CM

## 2021-04-08 DIAGNOSIS — U099 Post covid-19 condition, unspecified: Secondary | ICD-10-CM

## 2021-04-08 DIAGNOSIS — E038 Other specified hypothyroidism: Secondary | ICD-10-CM | POA: Diagnosis not present

## 2021-04-08 DIAGNOSIS — R002 Palpitations: Secondary | ICD-10-CM | POA: Diagnosis not present

## 2021-04-08 DIAGNOSIS — R197 Diarrhea, unspecified: Secondary | ICD-10-CM

## 2021-04-08 DIAGNOSIS — E559 Vitamin D deficiency, unspecified: Secondary | ICD-10-CM | POA: Diagnosis not present

## 2021-04-08 DIAGNOSIS — Z1321 Encounter for screening for nutritional disorder: Secondary | ICD-10-CM

## 2021-04-08 DIAGNOSIS — R509 Fever, unspecified: Secondary | ICD-10-CM | POA: Diagnosis not present

## 2021-04-08 DIAGNOSIS — T452X1A Poisoning by vitamins, accidental (unintentional), initial encounter: Secondary | ICD-10-CM

## 2021-04-08 DIAGNOSIS — I2510 Atherosclerotic heart disease of native coronary artery without angina pectoris: Secondary | ICD-10-CM | POA: Diagnosis not present

## 2021-04-08 DIAGNOSIS — E039 Hypothyroidism, unspecified: Secondary | ICD-10-CM

## 2021-04-08 NOTE — Progress Notes (Signed)
° °  Subjective:    Patient ID: Jenna Contreras, female    DOB: May 03, 1945, 76 y.o.   MRN: [id number]  HPI Pt having head/chest congestion, fever for 30 days, Saturday/Sunday fever of 100-101; fatigue, lightheadedness, diarrhea for 38 days. Pt states she has been taking 12000 units of Vit D3 and states that that could explain the diarrhea. Pt has not tested for COVID.  Patient taking 12,000 units of vitamin D She felt like that was going to help her immune system She has been having diarrhea She relates ongoing off-and-on low-grade fevers Also relates head congestion with some drainage Has had COVID for several weeks Has seen cardiology as well as pulmonology Has had some tachycardia and shortness of breath She also relates some fatigue and tiredness   Review of Systems     Objective:   Physical Exam She is not running fever currently no signs of pneumonia on exam General-in no acute distress Eyes-no discharge Lungs-respiratory rate normal, CTA CV-no murmurs,RRR Extremities skin warm dry no edema Neuro grossly normal Behavior normal, alert       Assessment & Plan:  1. Fever, unspecified fever cause I do not find evidence of bacterial illness right now more than likely COVID is causing off-and-on low-grade fevers.  Hold off on any type of extensive work-up in regards to blood work echo etc. - TSH - T4, free - Vitamin D (25 hydroxy) - Lipid Profile - COVID-19, Flu A+B and RSV  2. Other specified hypothyroidism History of hypothyroidism needs lab work to check could be contributing to her fatigue check lab work - TSH - T4, free - Vitamin D (25 hydroxy) - Lipid Profile  3. SOB (shortness of breath) Dyspnea and shortness of breath due to long COVID.  Lungs sound clear O2 sat good hold off on chest x-ray currently - TSH - T4, free - Vitamin D (25 hydroxy) - Lipid Profile  5. Diarrhea, unspecified type Diarrhea could be due to COVID but it could also be due to  excessive vitamin D we will check a level - TSH - T4, free - Vitamin D (25 hydroxy) - Lipid Profile  6. COVID-19 long hauler She is several weeks out she is still having symptoms of fatigue shortness of breath fast heart rate hopefully this will get better over time - COVID-19, Flu A+B and RSV  7. Hypothyroidism, unspecified type Check lab work continue medication watch diet - TSH - T4, free - Vitamin D (25 hydroxy) - Lipid Profile  8. Encounter for vitamin deficiency screening Check vitamin D - Vitamin D (25 hydroxy)  9. Vitamin D deficiency Check vitamin D - Vitamin D (25 hydroxy) Patient to keep regular follow-ups for pain management and other conditions

## 2021-04-09 DIAGNOSIS — R002 Palpitations: Secondary | ICD-10-CM | POA: Diagnosis not present

## 2021-04-09 LAB — COVID-19, FLU A+B AND RSV
Influenza A, NAA: NOT DETECTED
Influenza B, NAA: NOT DETECTED
RSV, NAA: NOT DETECTED
SARS-CoV-2, NAA: NOT DETECTED

## 2021-04-11 DIAGNOSIS — R0602 Shortness of breath: Secondary | ICD-10-CM | POA: Diagnosis not present

## 2021-04-11 DIAGNOSIS — R509 Fever, unspecified: Secondary | ICD-10-CM | POA: Diagnosis not present

## 2021-04-11 DIAGNOSIS — E038 Other specified hypothyroidism: Secondary | ICD-10-CM | POA: Diagnosis not present

## 2021-04-11 DIAGNOSIS — G8929 Other chronic pain: Secondary | ICD-10-CM | POA: Diagnosis not present

## 2021-04-11 DIAGNOSIS — Z981 Arthrodesis status: Secondary | ICD-10-CM | POA: Diagnosis not present

## 2021-04-11 DIAGNOSIS — E039 Hypothyroidism, unspecified: Secondary | ICD-10-CM | POA: Diagnosis not present

## 2021-04-11 DIAGNOSIS — R197 Diarrhea, unspecified: Secondary | ICD-10-CM | POA: Diagnosis not present

## 2021-04-11 DIAGNOSIS — Z1321 Encounter for screening for nutritional disorder: Secondary | ICD-10-CM | POA: Diagnosis not present

## 2021-04-11 DIAGNOSIS — T452X1A Poisoning by vitamins, accidental (unintentional), initial encounter: Secondary | ICD-10-CM | POA: Diagnosis not present

## 2021-04-11 DIAGNOSIS — M62830 Muscle spasm of back: Secondary | ICD-10-CM | POA: Diagnosis not present

## 2021-04-11 DIAGNOSIS — E559 Vitamin D deficiency, unspecified: Secondary | ICD-10-CM | POA: Diagnosis not present

## 2021-04-11 DIAGNOSIS — M542 Cervicalgia: Secondary | ICD-10-CM | POA: Diagnosis not present

## 2021-04-12 LAB — LIPID PANEL
Chol/HDL Ratio: 2.2 ratio (ref 0.0–4.4)
Cholesterol, Total: 215 mg/dL — ABNORMAL HIGH (ref 100–199)
HDL: 97 mg/dL (ref >39)
LDL Chol Calc (NIH): 111 mg/dL — ABNORMAL HIGH (ref 0–99)
Triglycerides: 38 mg/dL (ref 0–149)
VLDL Cholesterol Cal: 7 mg/dL (ref 5–40)

## 2021-04-12 LAB — T4, FREE: Free T4: 1.79 ng/dL — ABNORMAL HIGH (ref 0.82–1.77)

## 2021-04-12 LAB — TSH: TSH: 0.489 u[IU]/mL (ref 0.450–4.500)

## 2021-04-12 LAB — VITAMIN D 25 HYDROXY (VIT D DEFICIENCY, FRACTURES): Vit D, 25-Hydroxy: 106 ng/mL — ABNORMAL HIGH (ref 30.0–100.0)

## 2021-04-14 ENCOUNTER — Other Ambulatory Visit: Payer: Medicare Other

## 2021-04-15 ENCOUNTER — Other Ambulatory Visit: Payer: Self-pay

## 2021-04-15 ENCOUNTER — Ambulatory Visit: Payer: Medicare Other

## 2021-04-15 DIAGNOSIS — I2510 Atherosclerotic heart disease of native coronary artery without angina pectoris: Secondary | ICD-10-CM

## 2021-04-15 DIAGNOSIS — Z01810 Encounter for preprocedural cardiovascular examination: Secondary | ICD-10-CM | POA: Diagnosis not present

## 2021-04-15 DIAGNOSIS — R0609 Other forms of dyspnea: Secondary | ICD-10-CM

## 2021-04-21 NOTE — Progress Notes (Signed)
NOrmal echo and will discuss on OV soon

## 2021-04-24 DIAGNOSIS — Z881 Allergy status to other antibiotic agents status: Secondary | ICD-10-CM | POA: Diagnosis not present

## 2021-04-24 DIAGNOSIS — Z981 Arthrodesis status: Secondary | ICD-10-CM | POA: Diagnosis not present

## 2021-04-24 DIAGNOSIS — Z888 Allergy status to other drugs, medicaments and biological substances status: Secondary | ICD-10-CM | POA: Diagnosis not present

## 2021-04-24 DIAGNOSIS — M542 Cervicalgia: Secondary | ICD-10-CM | POA: Diagnosis not present

## 2021-04-24 DIAGNOSIS — M62830 Muscle spasm of back: Secondary | ICD-10-CM | POA: Diagnosis not present

## 2021-04-24 DIAGNOSIS — Z885 Allergy status to narcotic agent status: Secondary | ICD-10-CM | POA: Diagnosis not present

## 2021-04-24 DIAGNOSIS — M79642 Pain in left hand: Secondary | ICD-10-CM | POA: Diagnosis not present

## 2021-04-24 DIAGNOSIS — M47815 Spondylosis without myelopathy or radiculopathy, thoracolumbar region: Secondary | ICD-10-CM | POA: Diagnosis not present

## 2021-04-24 DIAGNOSIS — M2550 Pain in unspecified joint: Secondary | ICD-10-CM | POA: Diagnosis not present

## 2021-04-24 DIAGNOSIS — M40294 Other kyphosis, thoracic region: Secondary | ICD-10-CM | POA: Diagnosis not present

## 2021-04-24 DIAGNOSIS — G8929 Other chronic pain: Secondary | ICD-10-CM | POA: Diagnosis not present

## 2021-04-24 DIAGNOSIS — M79641 Pain in right hand: Secondary | ICD-10-CM | POA: Diagnosis not present

## 2021-04-24 DIAGNOSIS — Z9889 Other specified postprocedural states: Secondary | ICD-10-CM | POA: Diagnosis not present

## 2021-04-24 DIAGNOSIS — M41125 Adolescent idiopathic scoliosis, thoracolumbar region: Secondary | ICD-10-CM | POA: Diagnosis not present

## 2021-04-24 DIAGNOSIS — M19041 Primary osteoarthritis, right hand: Secondary | ICD-10-CM | POA: Diagnosis not present

## 2021-04-24 DIAGNOSIS — Z882 Allergy status to sulfonamides status: Secondary | ICD-10-CM | POA: Diagnosis not present

## 2021-04-24 DIAGNOSIS — M19042 Primary osteoarthritis, left hand: Secondary | ICD-10-CM | POA: Diagnosis not present

## 2021-04-24 DIAGNOSIS — M4055 Lordosis, unspecified, thoracolumbar region: Secondary | ICD-10-CM | POA: Diagnosis not present

## 2021-04-25 ENCOUNTER — Ambulatory Visit: Payer: Medicare Other | Admitting: Cardiology

## 2021-04-25 ENCOUNTER — Other Ambulatory Visit: Payer: Self-pay

## 2021-04-25 ENCOUNTER — Encounter: Payer: Self-pay | Admitting: Cardiology

## 2021-04-25 VITALS — BP 126/77 | HR 74 | Temp 98.0°F | Ht 62.0 in | Wt 113.0 lb

## 2021-04-25 DIAGNOSIS — E785 Hyperlipidemia, unspecified: Secondary | ICD-10-CM

## 2021-04-25 DIAGNOSIS — I2510 Atherosclerotic heart disease of native coronary artery without angina pectoris: Secondary | ICD-10-CM | POA: Diagnosis not present

## 2021-04-25 DIAGNOSIS — Z01810 Encounter for preprocedural cardiovascular examination: Secondary | ICD-10-CM | POA: Diagnosis not present

## 2021-04-25 MED ORDER — ROSUVASTATIN CALCIUM 10 MG PO TABS: 10.0000 mg | ORAL_TABLET | Freq: Every day | ORAL | 1 refills | Status: DC

## 2021-04-25 NOTE — Progress Notes (Signed)
Primary Physician/Referring:  Kathyrn Drown, MD  Patient ID: Jenna Contreras, female    DOB: [birth year], 76 y.o.   MRN: [id number]  Chief Complaint  Patient presents with   Shortness of Breath   Palpitations   Follow-up   Results   HPI:    Jenna Contreras  is a 76 y.o. Caucasian female with former smoker (38-pack-year history), hyperlipidemia, COPD and asthma, hypothyroidism, depression, anxiety, vitamin D deficiency, chronic pain.  Patient also had COVID-19 infection in 2022.  She was referred to our office for evaluation of coronary atherosclerosis noted on the CT scan. Patient notably also has upcoming knee surgeries for which PCP is requesting cardiac preoperative risk stratification.  I had seen her 6 weeks ago, for palpitations and preoperative cardiac risk stratification.  She has felt reassured with regard to palpitations since last office visit.  Nondisplaced with complaints today.  Chronic dyspnea has remained stable.  Past Medical History:  Diagnosis Date   Anxiety    Arthritis    Arthritis    Asthma    Bipolar 2 disorder (HCC)    Cancer (HCC)    Basal Cell   Constipation    Depression    Fibromyalgia    GERD (gastroesophageal reflux disease)    Heart murmur    As small child    History of gout    History of skin cancer    HNP (herniated nucleus pulposus with myelopathy), thoracic    Hypothyroidism    Left eye injury    In ED 06/17/2014    Low BP    Migraines    OCD (obsessive compulsive disorder)    Osteoporosis    Psoriasis    Scoliosis    Past Surgical History:  Procedure Laterality Date   Arthroscopic left knee surgery     BIOPSY  10/28/2017   Procedure: BIOPSY;  Surgeon: Danie Binder, MD;  Location: [facility];  Service: Endoscopy;;  duodenal biopsy , gastric biopsy    DILATION AND CURETTAGE OF UTERUS  x3   ESOPHAGOGASTRODUODENOSCOPY (EGD) WITH PROPOFOL N/A 10/28/2017   Procedure: ESOPHAGOGASTRODUODENOSCOPY (EGD) WITH PROPOFOL;   Surgeon: Danie Binder, MD;  Location: [facility];  Service: Endoscopy;  Laterality: N/A;  8:15am   HIP SURGERY Right 2015   iliac wings     KNEE SURGERY Bilateral 2015, 2016   MANDIBLE SURGERY     X 2   PARTIAL KNEE ARTHROPLASTY Left 02/12/2014   Procedure: LEFT KNEE UNICOMPARTMENTAL MEDIALLY ARTHROPLASTY ;  Surgeon: Mauri Pole, MD;  Location: [facility];  Service: Orthopedics;  Laterality: Left;   PARTIAL KNEE ARTHROPLASTY Right 07/02/2014   Procedure: RIGHT UNI KNEE ARTHOPLASTY MEDIALLY;  Surgeon: Paralee Cancel, MD;  Location: [facility];  Service: Orthopedics;  Laterality: Right;   rod in spine     scoliosis    TONSILLECTOMY     TOTAL HIP ARTHROPLASTY Right 05/09/2013   Procedure: RIGHT TOTAL HIP ARTHROPLASTY ANTERIOR APPROACH;  Surgeon: Mauri Pole, MD;  Location: [facility];  Service: Orthopedics;  Laterality: Right;   TUBAL LIGATION     WRIST SURGERY     Family History  Problem Relation Age of Onset   Heart attack Mother 64       MULTIPLE   Heart disease Mother 69   Diabetes Mother    Colon cancer Father 22   Heart disease Brother 38    Social History   Tobacco Use   Smoking status: Former    Packs/day:  2.00    Years: 19.00    Pack years: 38.00    Types: Cigarettes    Quit date: 03/10/1979    Years since quitting: 42.1   Smokeless tobacco: Never   Tobacco comments:    Quit in 1981  Substance Use Topics   Alcohol use: No    Alcohol/week: 0.0 standard drinks    Comment: no   Marital Status: Widowed   ROS  Review of Systems  Cardiovascular:  Positive for dyspnea on exertion and palpitations. Negative for chest pain, leg swelling, paroxysmal nocturnal dyspnea and syncope.  Gastrointestinal:  Negative for melena.   Objective  Blood pressure 126/77, pulse 74, temperature 98 F (36.7 C), temperature source Temporal, height 5\' 2"  (1.575 m), weight 113 lb (51.3 kg), SpO2 99 %.  Vitals with BMI 04/25/2021 04/08/2021 03/27/2021  Height 5\' 2"  - 5\' 2"   Weight 113 lbs 109 lbs  111 lbs  BMI 20.66 50.35 46.5  Systolic 681 - 275  Diastolic 77 - 67  Pulse 74 - 74     Physical Exam Neck:     Vascular: No carotid bruit or JVD.  Cardiovascular:     Rate and Rhythm: Normal rate and regular rhythm.     Pulses: Intact distal pulses.     Heart sounds: Normal heart sounds. No murmur heard.   No gallop.  Pulmonary:     Effort: Pulmonary effort is normal.     Breath sounds: Normal breath sounds.  Abdominal:     General: Bowel sounds are normal.     Palpations: Abdomen is soft.  Musculoskeletal:        General: No swelling.    Laboratory examination:   Recent Labs    11/24/20 0435 03/03/21 1918  NA 139 136  K 3.5 4.0  CL 104 105  CO2 24 22  GLUCOSE 93 101*  BUN 15 24*  CREATININE 0.71 0.61  CALCIUM 9.5 9.0  GFRNONAA >60 >60   CrCl cannot be calculated (Patient's most recent lab result is older than the maximum 21 days allowed.).  CMP Latest Ref Rng & Units 03/03/2021 11/24/2020 03/18/2020  Glucose 70 - 99 mg/dL 101(H) 93 94  BUN 8 - 23 mg/dL 24(H) 15 13  Creatinine 0.44 - 1.00 mg/dL 0.61 0.71 0.83  Sodium 135 - 145 mmol/L 136 139 138  Potassium 3.5 - 5.1 mmol/L 4.0 3.5 4.2  Chloride 98 - 111 mmol/L 105 104 102  CO2 22 - 32 mmol/L 22 24 23   Calcium 8.9 - 10.3 mg/dL 9.0 9.5 9.9  Total Protein 6.5 - 8.1 g/dL - 6.6 7.0  Total Bilirubin 0.3 - 1.2 mg/dL - 1.0 0.5  Alkaline Phos 38 - 126 U/L - 57 80  AST 15 - 41 U/L - 17 12  ALT 0 - 44 U/L - 16 10   CBC Latest Ref Rng & Units 03/03/2021 11/24/2020 03/18/2020  WBC 4.0 - 10.5 K/uL 8.3 8.1 6.9  Hemoglobin 12.0 - 15.0 g/dL 12.9 12.8 13.3  Hematocrit 36.0 - 46.0 % 38.7 39.0 41.0  Platelets 150 - 400 K/uL 297 294 286    Lipid Panel  Lipid profile 03/18/2020:  Total cholesterol 237, triglycerides 45, HDL 110, LDL 119.  Non-HDL cholesterol 127.  HEMOGLOBIN A1C Lab Results  Component Value Date   HGBA1C 5.5 10/05/2011   TSH Recent Labs    06/25/20 1136 04/11/21 1338  TSH 2.500 0.489    Allergies   Allergies  Allergen Reactions   Betadine [Povidone-Iodine] Rash  rash   Prednisone Other (See Comments)    Suicidal ideation   Silvadene [Silver Sulfadiazine] Hives   Sulfa Antibiotics Hives and Rash   Latex Other (See Comments)    Irritates her skin   Wound Dressing Adhesive Other (See Comments)    Pulls skin off   Adhesive [Tape]     Pulls her skin off   Betadine [Povidone Iodine] Other (See Comments)    Sets her on fire, turns skin purple    Lamictal [Lamotrigine]    Levaquin [Levofloxacin In D5w]     Painful joints    Tizanidine    Trazodone And Nefazodone     Bad dreams   Zyrtec [Cetirizine]    Baclofen Rash   Benadryl [Diphenhydramine] Rash   Cephalexin Itching and Rash   Claritin [Loratadine] Rash   Demerol [Meperidine] Nausea And Vomiting   Doxycycline Itching and Rash   Erythromycin Rash   Keflex [Cephalexin] Rash   Other Rash    Pulls her skin off BLISTER   Tetracyclines & Related Itching and Rash     Final Medications at End of Visit     Current Outpatient Medications:    albuterol (PROAIR HFA) 108 (90 Base) MCG/ACT inhaler, USE 2 INHALATIONS EVERY 4 HOURS AS NEEDED (Patient taking differently: Inhale 2 puffs into the lungs every 4 (four) hours as needed for wheezing.), Disp: 25.5 g, Rfl: 6   albuterol (PROVENTIL) (2.5 MG/3ML) 0.083% nebulizer solution, Take 3 mLs (2.5 mg total) by nebulization every 6 (six) hours as needed for wheezing or shortness of breath., Disp: 75 mL, Rfl: 12   ampicillin (PRINCIPEN) 500 MG capsule, Take 4 capsule po 30-60 min before procedure, Disp: 4 capsule, Rfl: 0   baclofen (LIORESAL) 10 MG tablet, Take 10 mg by mouth 3 (three) times daily., Disp: , Rfl:    chlorpheniramine (CHLOR-TRIMETON) 4 MG tablet, Take 1 tablet (4 mg total) by mouth every 4 (four) hours as needed for allergies (Cough)., Disp: 30 tablet, Rfl: 0   diclofenac (VOLTAREN) 75 MG EC tablet, TAKE 1 TABLET TWICE A DAY, Disp: 180 tablet, Rfl: 1    fluticasone (FLONASE) 50 MCG/ACT nasal spray, Place 2 sprays into both nostrils daily., Disp: 48 g, Rfl: 3   fluticasone (FLOVENT HFA) 110 MCG/ACT inhaler, Inhale 1-2 puffs into the lungs 2 (two) times daily., Disp: 12 each, Rfl: 3   HYDROcodone-acetaminophen (NORCO) 10-325 MG tablet, Take one tablet po TID prn, Disp: 75 tablet, Rfl: 0   hydrOXYzine (VISTARIL) 25 MG capsule, Take 1-2 capsules (25-50 mg total) by mouth 3 (three) times daily as needed for anxiety., Disp: 30 capsule, Rfl: 1   levothyroxine (SYNTHROID) 75 MCG tablet, Take one tablet po daily (Patient taking differently: Take 75 mcg by mouth daily before breakfast.), Disp: 90 tablet, Rfl: 1   NONFORMULARY OR COMPOUNDED ITEM, Apply topically., Disp: , Rfl:    ondansetron (ZOFRAN) 4 MG tablet, Take 1 tablet (4 mg total) by mouth every 6 (six) hours., Disp: 30 tablet, Rfl: 1   Polyethyl Glycol-Propyl Glycol (SYSTANE ULTRA OP), Place 1 drop into both eyes at bedtime., Disp: , Rfl:    rizatriptan (MAXALT) 10 MG tablet, May repeat in 2 hours if needed (Patient taking differently: Take 10 mg by mouth as needed for migraine.), Disp: 48 tablet, Rfl: 5   rosuvastatin (CRESTOR) 10 MG tablet, Take 1 tablet (10 mg total) by mouth daily., Disp: 90 tablet, Rfl: 1   tiZANidine (ZANAFLEX) 2 MG tablet, One po tid prn caution  drowsiness, Disp: 90 tablet, Rfl: 2   trolamine salicylate (ASPERCREME) 10 % cream, Apply 1 application topically as needed for muscle pain., Disp: , Rfl:    LORazepam (ATIVAN) 0.5 MG tablet, Take one tablet po qd prn anxiety caution drowsiness not to take at same time as hydrocodone (Patient not taking: Reported on 04/25/2021), Disp: 30 tablet, Rfl: 3   Radiology:   High-resolution CT scan of the chest 01/18/2020: LAD and RCA atherosclerosis, thoracic aortic atherosclerosis, no aneurysm. No interstitial lung disease, scattered small solid pulmonary nodules.  Cardiac Studies:   [facility] MYOCARDIAL PERFUSION WITH LEXISCAN  04/15/2021  Narrative Lexiscan Tetrofosmin stress test 04/15/2020: Lexiscan nuclear stress test performed using *-day protocol. Stress EKG is non-diagnostic, as this is pharmacological stress test using Lexiscan. Normal myocardial perfusion without convincing evidence of reversible myocardial ischemia or prior infarct. Normal wall thickness, no regional wall motion abnormalities, and visually hyperdynamic LVEF. Calculated LVEF 76%. Low risk study. No prior studies available for comparison.   [facility] ECHOCARDIOGRAM COMPLETE 04/15/2021  Narrative Echocardiogram 04/15/2021: Normal LV systolic function with visual EF 60-65%. Left ventricle cavity is normal in size. Normal left ventricular wall thickness. Normal global wall motion. Normal diastolic filling pattern, normal LAP. No significant valvular heart disease. No prior study for comparison.     EKG:   EKG 03/21/2021: Normal sinus rhythm at rate of 64 bpm, normal axis, no evidence of ischemia, normal EKG.  Assessment     ICD-10-CM   1. Atherosclerosis of native coronary artery of native heart without angina pectoris  I25.10 rosuvastatin (CRESTOR) 10 MG tablet    2. Mild hyperlipidemia  E78.5 rosuvastatin (CRESTOR) 10 MG tablet    3. Preoperative cardiovascular examination  Z01.810        There are no discontinued medications.  Meds ordered this encounter  Medications   rosuvastatin (CRESTOR) 10 MG tablet    Sig: Take 1 tablet (10 mg total) by mouth daily.    Dispense:  90 tablet    Refill:  1    Recommendations:   Jenna Contreras is a 76 y.o. remote tobacco use disorder, COPD, degenerative spine disease, degenerative joint disease, referred to me for evaluation of palpitations that started after her recent COVID infection in December 2022 and also for preoperative cardiovascular work-up.  I had seen her 6 weeks ago.  Patient states that she is presently doing well and she has felt reassured with regard to palpitations.   I reviewed the results of the stress test and echocardiogram and reassured her.  Primary prevention was discussed extensively, she has mild hyperlipidemia and coronary atherosclerosis that is significant noted by CT scan of the chest, she is willing to be on low-dose statins, will start Crestor 10 mg daily, if she has any side effects could even reduce this to 5 mg daily.  Otherwise stable from cardiac standpoint, she can undergo spine surgery and also left knee replacement with low risk.  She will follow-up with Dr. Nicki Reaper looking and has an appointment in a few months, she will repeat lipids during follow-up there.  I thank Dr. Sallee Lange for sending this pleasant patient to me for evaluation.   Adrian Prows, MD, [facility] 04/26/2021, 9:20 AM Office: [phone] Fax: [phone] Pager: [phone]

## 2021-04-26 ENCOUNTER — Encounter: Payer: Self-pay | Admitting: Cardiology

## 2021-04-28 ENCOUNTER — Ambulatory Visit: Payer: Medicare Other | Admitting: Cardiology

## 2021-04-28 DIAGNOSIS — M1712 Unilateral primary osteoarthritis, left knee: Secondary | ICD-10-CM | POA: Diagnosis not present

## 2021-04-28 DIAGNOSIS — M7061 Trochanteric bursitis, right hip: Secondary | ICD-10-CM | POA: Diagnosis not present

## 2021-04-28 DIAGNOSIS — Z96651 Presence of right artificial knee joint: Secondary | ICD-10-CM | POA: Diagnosis not present

## 2021-04-28 DIAGNOSIS — Z96641 Presence of right artificial hip joint: Secondary | ICD-10-CM | POA: Diagnosis not present

## 2021-04-29 DIAGNOSIS — M7061 Trochanteric bursitis, right hip: Secondary | ICD-10-CM | POA: Diagnosis not present

## 2021-05-02 DIAGNOSIS — Z20822 Contact with and (suspected) exposure to covid-19: Secondary | ICD-10-CM | POA: Diagnosis not present

## 2021-05-05 ENCOUNTER — Telehealth: Payer: Self-pay | Admitting: Family Medicine

## 2021-05-05 ENCOUNTER — Other Ambulatory Visit: Payer: Self-pay

## 2021-05-05 ENCOUNTER — Ambulatory Visit (INDEPENDENT_AMBULATORY_CARE_PROVIDER_SITE_OTHER): Payer: Medicare Other | Admitting: Family Medicine

## 2021-05-05 ENCOUNTER — Encounter: Payer: Self-pay | Admitting: Family Medicine

## 2021-05-05 VITALS — BP 116/74 | HR 62 | Temp 98.3°F | Ht 62.0 in | Wt 112.0 lb

## 2021-05-05 DIAGNOSIS — T452X1D Poisoning by vitamins, accidental (unintentional), subsequent encounter: Secondary | ICD-10-CM | POA: Diagnosis not present

## 2021-05-05 DIAGNOSIS — E559 Vitamin D deficiency, unspecified: Secondary | ICD-10-CM

## 2021-05-05 DIAGNOSIS — E785 Hyperlipidemia, unspecified: Secondary | ICD-10-CM

## 2021-05-05 DIAGNOSIS — R52 Pain, unspecified: Secondary | ICD-10-CM

## 2021-05-05 DIAGNOSIS — M542 Cervicalgia: Secondary | ICD-10-CM | POA: Diagnosis not present

## 2021-05-05 DIAGNOSIS — I2510 Atherosclerotic heart disease of native coronary artery without angina pectoris: Secondary | ICD-10-CM | POA: Diagnosis not present

## 2021-05-05 DIAGNOSIS — M62830 Muscle spasm of back: Secondary | ICD-10-CM | POA: Diagnosis not present

## 2021-05-05 DIAGNOSIS — Z79891 Long term (current) use of opiate analgesic: Secondary | ICD-10-CM | POA: Diagnosis not present

## 2021-05-05 DIAGNOSIS — G8929 Other chronic pain: Secondary | ICD-10-CM | POA: Diagnosis not present

## 2021-05-05 DIAGNOSIS — Z981 Arthrodesis status: Secondary | ICD-10-CM | POA: Diagnosis not present

## 2021-05-05 MED ORDER — ROSUVASTATIN CALCIUM 10 MG PO TABS: 10.0000 mg | ORAL_TABLET | Freq: Every day | ORAL | 1 refills | Status: DC

## 2021-05-05 MED ORDER — HYDROCODONE-ACETAMINOPHEN 10-325 MG PO TABS: ORAL_TABLET | ORAL | 0 refills | Status: DC

## 2021-05-05 MED ORDER — DICLOFENAC SODIUM 75 MG PO TBEC: 75.0000 mg | DELAYED_RELEASE_TABLET | Freq: Two times a day (BID) | ORAL | 1 refills | Status: DC

## 2021-05-05 MED ORDER — BACLOFEN 10 MG PO TABS: ORAL_TABLET | ORAL | 3 refills | Status: DC

## 2021-05-05 NOTE — Telephone Encounter (Signed)
Patient disputing outstanding balance from visit in January, 2023; states secondary insurance is [employer]. Requesting for bill to be resubmitted to them. Copy of insurance card in documents  ID # [id number].  Please advise at [phone].

## 2021-05-05 NOTE — Progress Notes (Signed)
Subjective:    Patient ID: Jenna Contreras, female    DOB: 08/14/1945, 76 y.o.   MRN: [id number] Very nice patient has chronic back pain as well as knee pain and hand and wrist pain HPI This patient was seen today for chronic pain  The medication list was reviewed and updated.  Location of Pain for which the patient has been treated with regarding narcotics: multiple joint pain - back , R wrist , knee, hip  Onset of this pain: chronic    -Compliance with medication: no  - Number patient states they take daily: 2 to 3   -when was the last dose patient took? 11 pm last night   The patient was advised the importance of maintaining medication and not using illegal substances with these.  Here for refills and follow up  The patient was educated that we can provide 3 monthly scripts for their medication, it is their responsibility to follow the instructions.  Side effects or complications from medications: no  Patient is aware that pain medications are meant to minimize the severity of the pain to allow their pain levels to improve to allow for better function. They are aware of that pain medications cannot totally remove their pain.  Due for UDT ( at least once per year) :   Scale of 1 to 10 ( 1 is least 10 is most) Your pain level without the medicine: 9 Your pain level with medication 7  Scale 1 to 10 ( 1-helps very little, 10 helps very well) How well does your pain medication reduce your pain so you can function better through out the day? 4   Quality of the pain: sharp pain   Persistence of the pain: 24 /hr  Modifying factors: laying down   Patient would also like to discuss upcoming R hand surgeries Right knee surgeries Discuss cardiology report     Refill muscle relaxer and diclofenac tabs 90 days supply  We did discuss how muscle relaxers can cause drowsiness she states the medication does not make her drowsy We also discussed how its not wise to have to  muscle relaxers she was stating 1 would help her at night and the other 1 during the day but I have convinced her to whittle this down to just 1 type of muscle relaxer baclofen she was warned that with pain medicine they can can make a person drowsy she states she does not get drowsy with the medicine  She does state that her orthopedist doctor is talking about doing a right knee surgery and having her see a specialist for the right hand, she has erosive arthritis In addition to this cardiology saw her overall gave her a good bill of health for surgeries but nonetheless we did discuss this patient was concerned Review of Systems     Objective:   Physical Exam  General-in no acute distress Eyes-no discharge Lungs-respiratory rate normal, CTA CV-no murmurs,RRR Extremities skin warm dry no edema Neuro grossly normal Behavior normal, alert No edema in the legs arthritis noted in the hands and wrists She does have subjective back pain where she had her surgery previously     Assessment & Plan:  1. Atherosclerosis of native coronary artery of native heart without angina pectoris Patient does have atherosclerosis recommend continuing rosuvastatin on a regular basis. - rosuvastatin (CRESTOR) 10 MG tablet; Take 1 tablet (10 mg total) by mouth daily.  Dispense: 90 tablet; Refill: 1 - VITAMIN D 25 Hydroxy (Vit-D Deficiency,  Fractures)  2. Mild hyperlipidemia Rosuvastatin recommended - rosuvastatin (CRESTOR) 10 MG tablet; Take 1 tablet (10 mg total) by mouth daily.  Dispense: 90 tablet; Refill: 1  3. Poisoning by vitamin D, accidental or unintentional, subsequent encounter Patient states cardiologist told her much of her symptoms was due to excessive vitamin D and recommended that she stop the medicine for 15 days then check a vitamin D level then resume at 5000 units daily - VITAMIN D 25 Hydroxy (Vit-D Deficiency, Fractures)  4. Encounter for pain management Pain medication was sent in for  today. - ToxASSURE Select 13 (MW), Urine - VITAMIN D 25 Hydroxy (Vit-D Deficiency, Fractures)  5. Vitamin D deficiency Previously patient vitamin D deficient until she took too much accidentally now we will recheck the level - VITAMIN D 25 Hydroxy (Vit-D Deficiency, Fractures)

## 2021-05-06 ENCOUNTER — Other Ambulatory Visit: Payer: Self-pay | Admitting: Family Medicine

## 2021-05-06 ENCOUNTER — Encounter: Payer: Medicare Other | Attending: Physical Medicine & Rehabilitation | Admitting: Physical Medicine & Rehabilitation

## 2021-05-06 ENCOUNTER — Telehealth: Payer: Self-pay | Admitting: *Deleted

## 2021-05-06 ENCOUNTER — Ambulatory Visit: Payer: Medicare Other | Admitting: Family Medicine

## 2021-05-06 LAB — VITAMIN D 25 HYDROXY (VIT D DEFICIENCY, FRACTURES): Vit D, 25-Hydroxy: 88.2 ng/mL (ref 30.0–100.0)

## 2021-05-06 LAB — MED LIST OPTION NOT SELECTED

## 2021-05-06 MED ORDER — BACLOFEN 10 MG PO TABS: ORAL_TABLET | ORAL | 1 refills | Status: DC

## 2021-05-06 NOTE — Telephone Encounter (Signed)
Patient says that [facility] called her yesterday regarding her medication.  She says she needs a 90 day supply of the Baclofen sent in to the pharmacy.  Please advise.  Thank you.

## 2021-05-06 NOTE — Telephone Encounter (Signed)
done

## 2021-05-07 DIAGNOSIS — Z20822 Contact with and (suspected) exposure to covid-19: Secondary | ICD-10-CM | POA: Diagnosis not present

## 2021-05-08 ENCOUNTER — Other Ambulatory Visit: Payer: Self-pay

## 2021-05-08 ENCOUNTER — Ambulatory Visit (INDEPENDENT_AMBULATORY_CARE_PROVIDER_SITE_OTHER): Payer: Medicare Other | Admitting: Primary Care

## 2021-05-08 ENCOUNTER — Encounter: Payer: Self-pay | Admitting: Primary Care

## 2021-05-08 DIAGNOSIS — J4520 Mild intermittent asthma, uncomplicated: Secondary | ICD-10-CM | POA: Diagnosis not present

## 2021-05-08 DIAGNOSIS — R0602 Shortness of breath: Secondary | ICD-10-CM

## 2021-05-08 DIAGNOSIS — R911 Solitary pulmonary nodule: Secondary | ICD-10-CM | POA: Diagnosis not present

## 2021-05-08 LAB — TOXASSURE SELECT 13 (MW), URINE

## 2021-05-08 LAB — SPECIMEN STATUS REPORT

## 2021-05-08 NOTE — Assessment & Plan Note (Signed)
CT Chest imaging in November 2021 showed no evidence of ILD. Scattered small solid pulmonary nodules in both lungs which were stable and considered benign.No further follow-up needed unless symptoms change ?

## 2021-05-08 NOTE — Assessment & Plan Note (Signed)
-   Recovered nicely after covid. No residual respiratory symptoms. Denies sob, cough or chest tightness. She is not taking Flovent, ok to stop. Continue prn Albuterol hfa 2 puff every 6 hours for shortness of breath/wheezing. Follow-up in 1 year or sooner if needed.  ?
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
## 2021-05-08 NOTE — Patient Instructions (Signed)
Glad to see you are doing well Jenna Contreras ? ?Stopping Flovent ?Continue Flonase as needed  ?Continue Albuterol as needed  ? ?Follow-up: ?1 year with [name] or [facility] NP/ Call sooner if needed  ?

## 2021-05-08 NOTE — Assessment & Plan Note (Addendum)
-   High vit D level thought to be contributing to dyspnea, palpitations and dizziness. Symptoms have resolved. Last vit D level was within normal limits.  ?

## 2021-05-12 DIAGNOSIS — Z20822 Contact with and (suspected) exposure to covid-19: Secondary | ICD-10-CM | POA: Diagnosis not present

## 2021-05-19 ENCOUNTER — Telehealth: Payer: Self-pay | Admitting: Primary Care

## 2021-05-19 NOTE — Telephone Encounter (Signed)
Fax received from Dr. Len Childs with [place] to perform a Right total knee revision on patient.  Patient needs surgery clearance. Patient was seen on 05/08/2021. Office protocol is a risk assessment can be sent to surgeon if patient has been seen in 60 days or less.  ? ?Sending to [facility] for risk assessment or recommendations if patient needs to be seen in office prior to surgical procedure.   ?

## 2021-05-25 ENCOUNTER — Encounter: Payer: Self-pay | Admitting: Cardiology

## 2021-05-26 NOTE — Telephone Encounter (Signed)
OV notes and clearance form have been faxed back to [facility]. Nothing further needed at this time.  ?

## 2021-05-26 NOTE — Telephone Encounter (Signed)
Please advise if patient is cleared for surgery ?

## 2021-05-26 NOTE — Telephone Encounter (Signed)
Patient is considered low-intermediate risk for prolonged mech ventilation and/or post op pulmonary complications.  ? ? ? ?1) RISK FOR PROLONGED MECHANICAL VENTILAION - > 48h ? ?1A) Arozullah - Prolonged mech ventilation risk ?Arozullah Postperative Pulmonary Risk Score - for mech ventilation dependence [address] [employer], [place], major non-cardiac surgery) Comment Score  ?Type of surgery - abd ao aneurysm (27), thoracic (21), neurosurgery / upper abdominal / vascular (21), neck (11) Right total knee 6  ?Emergency Surgery - (11)  0  ?ALbumin < 3 or poor nutritional state - (9)  0  ?BUN > 30 -  (8)  0  ?Partial or completely dependent functional status - (7)  0  ?COPD -  (6)  0  ?Age - 61 to 99 (4), > 70  (6)  6  ?TOTAL  12  ?Risk Stratifcation scores  - < 10 (0.5%), 11-19 (1.8%), 20-27 (4.2%), 28-40 (10.1%), >40 (26.6%)  1.8%   ? ? ? ? ?1B) GUPTA - Prolonged Mech Vent Risk ?Score source Risk  ?Guptal post op prolonged mech ventilation > 48h or reintubation < 30 days - ACS 2007-2008 dataset - http://lewis-perez.info/ 0.3 % ?Risk of mechanical ventilation for >48 hrs after surgery, or unplanned intubation ?30 days of surgery  ? ? ?2) RISK FOR POST OP PNEUMONIA ?Score source Risk  ?Lyndel Safe - Post Op Pnemounia risk  TonerProviders.[email or web address] 0.4 % ?Risk of postoperative pneumonia  ? ? ?R3) ISK FOR ANY POST-OP PULMONARY COMPLICATION ?Score source Risk  ?CANET/ARISCAT Score - risk for ANY/ALl pulmonary complications - > risk of in-hospital post-op pulmonary complications (composite including respiratory failure, respiratory infection, pleural effusion, atelectasis, pneumothorax, bronchospasm, aspiration pneumonitis) [employer] - based on age, anemia, pulse ox, resp infection prior 30d, incision site, duration of surgery, and emergency v elective surgery Low risk ?1.6% risk of  in-hospital post-op pulmonary complications (composite including respiratory failure, respiratory infection, pleural effusion, atelectasis, pneumothorax, bronchospasm, aspiration pneumonitis)  ? ? ?

## 2021-05-29 ENCOUNTER — Telehealth: Payer: Self-pay | Admitting: Family Medicine

## 2021-05-29 NOTE — Telephone Encounter (Signed)
?  Left message for patient to call back and schedule Medicare Annual Wellness Visit (AWV) in office.  ? ?If unable to come into the office for AWV,  please offer to do virtually or by telephone. ? ?No hx of AWV eligible for AWVI per [facility] as of 03/18/2020 ? ?Please schedule at anytime with [facility].     ? ?45 minute appointment  ? ?Any questions, please call me at [phone]   ?

## 2021-06-03 ENCOUNTER — Other Ambulatory Visit: Payer: Self-pay

## 2021-06-03 ENCOUNTER — Ambulatory Visit (INDEPENDENT_AMBULATORY_CARE_PROVIDER_SITE_OTHER): Payer: Medicare Other | Admitting: Family Medicine

## 2021-06-03 ENCOUNTER — Telehealth: Payer: Self-pay | Admitting: *Deleted

## 2021-06-03 DIAGNOSIS — I2510 Atherosclerotic heart disease of native coronary artery without angina pectoris: Secondary | ICD-10-CM

## 2021-06-03 DIAGNOSIS — M170 Bilateral primary osteoarthritis of knee: Secondary | ICD-10-CM | POA: Diagnosis not present

## 2021-06-03 NOTE — Progress Notes (Signed)
? ?  Subjective:  ? ? Patient ID: Jenna Contreras, female    DOB: 06/21/1945, 76 y.o.   MRN: [id number] ? ?HPI ? ?Patient will be having total knee replacement of right knee on 06/25/21.  ? ? Patient has concern about pain medication. She will be getting pain medication PO for a week.  ?She is concerned about her knee surgery ?She is concerned that the oxycodone that they are going to prescribe will not help her ?She states in the past she had to have IV pain medicines after surgery ?We did discuss how when she comes home we will need to work with oxycodone either 5 or 10 mg at a time ?Her surgeon will prescribe a maximum of 5 to 7 days then we will work with her regarding pain management ? ?Virtual Visit via Telephone Note ? ?I connected with Jenna Contreras on 06/03/21 at 11:20 AM EDT by telephone and verified that I am speaking with the correct person using two identifiers. ? ?Location: ?Patient: home  ?Provider: office ?  ?I discussed the limitations, risks, security and privacy concerns of performing an evaluation and management service by telephone and the availability of in person appointments. I also discussed with the patient that there may be a patient responsible charge related to this service. The patient expressed understanding and agreed to proceed. ? ? ?History of Present Illness: ? ?  ?Observations/Objective: ? ? ?Assessment and Plan: ? ? ?Follow Up Instructions: ? ?  ?I discussed the assessment and treatment plan with the patient. The patient was provided an opportunity to ask questions and all were answered. The patient agreed with the plan and demonstrated an understanding of the instructions. ?  ?The patient was advised to call back or seek an in-person evaluation if the symptoms worsen or if the condition fails to improve as anticipated. ? ?I provided  15 minutes of non-face-to-face time during this encounter. ? ? ? ?Review of Systems ? ?   ?Objective:  ? Physical Exam ?.b20 ?Today's  visit was via telephone ?Physical exam was not possible for this visit ? ? ? ? ?   ?Assessment & Plan:  ?Pain management ?Currently right now using hydrocodone ?After surgery will need oxycodone hopefully 5 mg will help every 4 hours as needed but if necessary we can go to 10 mg ?Patient is to give [place] feedback within 1 to 2 days after surgery how she is doing-the surgeon will prescribe 5 days of pain medicine but we can help after that with her. ? ?From a cardiovascular standpoint she is approved for surgery ?She understands that no surgery is risk-free ?It is important for her to ambulate quickly after surgery to minimize risk of blood clots ?All potential risks she should discuss with her orthopedic surgeon ? ?

## 2021-06-03 NOTE — Telephone Encounter (Signed)
I have reviewed patients account she has no outstanding balances at this time. It looks like DOS 1/23 was sent to Medicare as primary and then it was sent to [facility] ?

## 2021-06-03 NOTE — Telephone Encounter (Signed)
Ms. zofia, peckinpaugh are scheduled for a virtual visit with your provider today.   ? ?Just as we do with appointments in the office, we must obtain your consent to participate.  Your consent will be active for this visit and any virtual visit you may have with one of our providers in the next 365 days.   ? ?If you have a MyChart account, I can also send a copy of this consent to you electronically.  All virtual visits are billed to your insurance company just like a traditional visit in the office.  As this is a virtual visit, video technology does not allow for your provider to perform a traditional examination.  This may limit your provider's ability to fully assess your condition.  If your provider identifies any concerns that need to be evaluated in person or the need to arrange testing such as labs, EKG, etc, we will make arrangements to do so.   ? ?Although advances in technology are sophisticated, we cannot ensure that it will always work on either your end or our end.  If the connection with a video visit is poor, we may have to switch to a telephone visit.  With either a video or telephone visit, we are not always able to ensure that we have a secure connection.   I need to obtain your verbal consent now.   Are you willing to proceed with your visit today?  ? ?Charm Britton-Watkins has provided verbal consent on 06/03/2021 for a virtual visit (video or telephone). ? ? ?Ned Card, LPN ?[id number]  41:42 AM ?  ?

## 2021-06-05 DIAGNOSIS — M180 Bilateral primary osteoarthritis of first carpometacarpal joints: Secondary | ICD-10-CM | POA: Diagnosis not present

## 2021-06-05 DIAGNOSIS — M1811 Unilateral primary osteoarthritis of first carpometacarpal joint, right hand: Secondary | ICD-10-CM | POA: Diagnosis not present

## 2021-06-12 ENCOUNTER — Telehealth: Payer: Self-pay

## 2021-06-12 NOTE — Telephone Encounter (Signed)
Encourage patient to contact the pharmacy for refills or they can request refills through [facility] ? ?(Please schedule appointment if patient has not been seen in over a year) ? ? ? ?WHAT PHARMACY WOULD THEY LIKE THIS SENT TO: [facility]  ?[address] [place] [address]  ? ?MEDICATION NAME & DOSE:levothyroxine (SYNTHROID) 75 MCG tablet , baclofen (LIORESAL) 10 MG tablet  ? ?NOTES/COMMENTS FROM PATIENT:Pt wants to be back on the baclofen this is whans going compound that she rubs on and also the inject when they give in her back she wants to be on same relaxer  ? ? ? ? ? ?[place] office please notify patient: ?It takes 48-72 hours to process rx refill requests ?Ask patient to call pharmacy to ensure rx is ready before heading there.  ? ?

## 2021-06-16 ENCOUNTER — Other Ambulatory Visit: Payer: Self-pay | Admitting: *Deleted

## 2021-06-16 MED ORDER — LEVOTHYROXINE SODIUM 75 MCG PO TABS: ORAL_TABLET | ORAL | 1 refills | Status: DC

## 2021-06-16 NOTE — Telephone Encounter (Signed)
Patient stated that she found the Baclofen this weekend. Patient informed that refill on levothyroxine sent to pharmacy. Verbalized understanding. ?

## 2021-06-16 NOTE — Telephone Encounter (Signed)
Nurses-may have 6 months on levothyroxine ? ?Also baclofen was refilled back in [place] should have some already?  If for some reason she says she does not you may send it back through.  Caution drowsiness with pain medicine use sparingly ?

## 2021-06-16 NOTE — Addendum Note (Signed)
Addended by: Madelin Rear on: 06/16/2021 03:37 PM ? ? Modules accepted: Orders ? ?

## 2021-06-18 DIAGNOSIS — Z01812 Encounter for preprocedural laboratory examination: Secondary | ICD-10-CM | POA: Diagnosis not present

## 2021-06-18 DIAGNOSIS — M1711 Unilateral primary osteoarthritis, right knee: Secondary | ICD-10-CM | POA: Diagnosis not present

## 2021-06-18 DIAGNOSIS — M810 Age-related osteoporosis without current pathological fracture: Secondary | ICD-10-CM | POA: Diagnosis not present

## 2021-06-18 DIAGNOSIS — Z01810 Encounter for preprocedural cardiovascular examination: Secondary | ICD-10-CM | POA: Diagnosis not present

## 2021-06-18 DIAGNOSIS — Z79899 Other long term (current) drug therapy: Secondary | ICD-10-CM | POA: Diagnosis not present

## 2021-06-24 ENCOUNTER — Other Ambulatory Visit: Payer: Self-pay | Admitting: Family Medicine

## 2021-06-24 ENCOUNTER — Telehealth: Payer: Self-pay | Admitting: Family Medicine

## 2021-06-24 MED ORDER — OXYCODONE-ACETAMINOPHEN 5-325 MG PO TABS: ORAL_TABLET | ORAL | 0 refills | Status: DC

## 2021-06-24 NOTE — Telephone Encounter (Signed)
A prescription for 30 tablets of Percocet 5 mg/325 was sent in.  Should be noted that this is the next step up from hydrocodone.  I would recommend 1 every 4-6 hours as needed for severe pain caution drowsiness.  If she needs additional medicine at the end of 30 tablets we can send additional medicine in.  State regulations prevent [place] from sending more than that at 1 time. ?

## 2021-06-24 NOTE — Telephone Encounter (Signed)
Patient is having knee surgery tomorrow on both knees and requesting perocet be called in [place]. She states her surgeon will only call in 7 pills on 4/20 she needing called in before 4/20 so that it can be picked up. She was told to here and you would send in a prescription for her. Please advise ?

## 2021-06-25 DIAGNOSIS — T8484XA Pain due to internal orthopedic prosthetic devices, implants and grafts, initial encounter: Secondary | ICD-10-CM | POA: Diagnosis present

## 2021-06-25 DIAGNOSIS — Z87891 Personal history of nicotine dependence: Secondary | ICD-10-CM | POA: Diagnosis not present

## 2021-06-25 DIAGNOSIS — Z96651 Presence of right artificial knee joint: Secondary | ICD-10-CM | POA: Diagnosis present

## 2021-06-25 DIAGNOSIS — M1711 Unilateral primary osteoarthritis, right knee: Secondary | ICD-10-CM | POA: Diagnosis present

## 2021-06-25 DIAGNOSIS — Z96649 Presence of unspecified artificial hip joint: Secondary | ICD-10-CM | POA: Diagnosis present

## 2021-06-25 DIAGNOSIS — G8918 Other acute postprocedural pain: Secondary | ICD-10-CM | POA: Diagnosis not present

## 2021-06-25 DIAGNOSIS — Z9882 Breast implant status: Secondary | ICD-10-CM | POA: Diagnosis not present

## 2021-06-25 NOTE — Telephone Encounter (Signed)
LMTRC

## 2021-06-26 DIAGNOSIS — Z87891 Personal history of nicotine dependence: Secondary | ICD-10-CM | POA: Diagnosis not present

## 2021-06-26 DIAGNOSIS — Z9882 Breast implant status: Secondary | ICD-10-CM | POA: Diagnosis not present

## 2021-06-26 DIAGNOSIS — T8484XA Pain due to internal orthopedic prosthetic devices, implants and grafts, initial encounter: Secondary | ICD-10-CM | POA: Diagnosis present

## 2021-06-26 DIAGNOSIS — Z96649 Presence of unspecified artificial hip joint: Secondary | ICD-10-CM | POA: Diagnosis present

## 2021-06-26 DIAGNOSIS — Z96651 Presence of right artificial knee joint: Secondary | ICD-10-CM | POA: Diagnosis present

## 2021-06-26 DIAGNOSIS — M1711 Unilateral primary osteoarthritis, right knee: Secondary | ICD-10-CM | POA: Diagnosis present

## 2021-06-27 ENCOUNTER — Other Ambulatory Visit: Payer: Self-pay | Admitting: Family Medicine

## 2021-06-27 NOTE — Telephone Encounter (Signed)
Patient advised of md message and recommendations.  Patient states that the surgeon told her to 2 tablets around the clock if pain is greater than 5.  Patient says her pain is a 10.  The surgeon also sent her in some percocet.  Please advise. Thank you ?

## 2021-06-27 NOTE — Telephone Encounter (Signed)
Detailed message left on voicemail (OK per DPR) ?

## 2021-06-27 NOTE — Telephone Encounter (Signed)
Hopefully between what we prescribed and what the surgeon prescribed will get her through the initial phase of all this-she can certainly give [place] an update next week on whether or not she needs additional medicine sent in thanks ?

## 2021-06-28 DIAGNOSIS — Z981 Arthrodesis status: Secondary | ICD-10-CM | POA: Diagnosis not present

## 2021-06-28 DIAGNOSIS — G8929 Other chronic pain: Secondary | ICD-10-CM | POA: Diagnosis not present

## 2021-06-28 DIAGNOSIS — D689 Coagulation defect, unspecified: Secondary | ICD-10-CM | POA: Diagnosis not present

## 2021-06-28 DIAGNOSIS — F319 Bipolar disorder, unspecified: Secondary | ICD-10-CM | POA: Diagnosis not present

## 2021-06-28 DIAGNOSIS — Z79899 Other long term (current) drug therapy: Secondary | ICD-10-CM | POA: Diagnosis not present

## 2021-06-28 DIAGNOSIS — Z993 Dependence on wheelchair: Secondary | ICD-10-CM | POA: Diagnosis not present

## 2021-06-28 DIAGNOSIS — Z9181 History of falling: Secondary | ICD-10-CM | POA: Diagnosis not present

## 2021-06-28 DIAGNOSIS — Z471 Aftercare following joint replacement surgery: Secondary | ICD-10-CM | POA: Diagnosis not present

## 2021-06-28 DIAGNOSIS — Z96651 Presence of right artificial knee joint: Secondary | ICD-10-CM | POA: Diagnosis not present

## 2021-06-28 DIAGNOSIS — Z96641 Presence of right artificial hip joint: Secondary | ICD-10-CM | POA: Diagnosis not present

## 2021-06-28 DIAGNOSIS — G43909 Migraine, unspecified, not intractable, without status migrainosus: Secondary | ICD-10-CM | POA: Diagnosis not present

## 2021-06-28 DIAGNOSIS — Z9851 Tubal ligation status: Secondary | ICD-10-CM | POA: Diagnosis not present

## 2021-06-28 DIAGNOSIS — Z87891 Personal history of nicotine dependence: Secondary | ICD-10-CM | POA: Diagnosis not present

## 2021-06-28 DIAGNOSIS — E079 Disorder of thyroid, unspecified: Secondary | ICD-10-CM | POA: Diagnosis not present

## 2021-06-28 DIAGNOSIS — Z9882 Breast implant status: Secondary | ICD-10-CM | POA: Diagnosis not present

## 2021-06-30 DIAGNOSIS — Z96651 Presence of right artificial knee joint: Secondary | ICD-10-CM | POA: Diagnosis not present

## 2021-06-30 DIAGNOSIS — D689 Coagulation defect, unspecified: Secondary | ICD-10-CM | POA: Diagnosis not present

## 2021-06-30 DIAGNOSIS — E079 Disorder of thyroid, unspecified: Secondary | ICD-10-CM | POA: Diagnosis not present

## 2021-06-30 DIAGNOSIS — F319 Bipolar disorder, unspecified: Secondary | ICD-10-CM | POA: Diagnosis not present

## 2021-06-30 DIAGNOSIS — G8929 Other chronic pain: Secondary | ICD-10-CM | POA: Diagnosis not present

## 2021-06-30 DIAGNOSIS — Z471 Aftercare following joint replacement surgery: Secondary | ICD-10-CM | POA: Diagnosis not present

## 2021-07-02 DIAGNOSIS — E079 Disorder of thyroid, unspecified: Secondary | ICD-10-CM | POA: Diagnosis not present

## 2021-07-02 DIAGNOSIS — Z96651 Presence of right artificial knee joint: Secondary | ICD-10-CM | POA: Diagnosis not present

## 2021-07-02 DIAGNOSIS — F319 Bipolar disorder, unspecified: Secondary | ICD-10-CM | POA: Diagnosis not present

## 2021-07-02 DIAGNOSIS — G8929 Other chronic pain: Secondary | ICD-10-CM | POA: Diagnosis not present

## 2021-07-02 DIAGNOSIS — Z471 Aftercare following joint replacement surgery: Secondary | ICD-10-CM | POA: Diagnosis not present

## 2021-07-02 DIAGNOSIS — D689 Coagulation defect, unspecified: Secondary | ICD-10-CM | POA: Diagnosis not present

## 2021-07-03 DIAGNOSIS — Z96651 Presence of right artificial knee joint: Secondary | ICD-10-CM | POA: Diagnosis not present

## 2021-07-03 DIAGNOSIS — G8929 Other chronic pain: Secondary | ICD-10-CM | POA: Diagnosis not present

## 2021-07-03 DIAGNOSIS — F319 Bipolar disorder, unspecified: Secondary | ICD-10-CM | POA: Diagnosis not present

## 2021-07-03 DIAGNOSIS — D689 Coagulation defect, unspecified: Secondary | ICD-10-CM | POA: Diagnosis not present

## 2021-07-03 DIAGNOSIS — Z471 Aftercare following joint replacement surgery: Secondary | ICD-10-CM | POA: Diagnosis not present

## 2021-07-03 DIAGNOSIS — E079 Disorder of thyroid, unspecified: Secondary | ICD-10-CM | POA: Diagnosis not present

## 2021-07-04 DIAGNOSIS — Z471 Aftercare following joint replacement surgery: Secondary | ICD-10-CM | POA: Diagnosis not present

## 2021-07-04 DIAGNOSIS — G8929 Other chronic pain: Secondary | ICD-10-CM | POA: Diagnosis not present

## 2021-07-04 DIAGNOSIS — D689 Coagulation defect, unspecified: Secondary | ICD-10-CM | POA: Diagnosis not present

## 2021-07-04 DIAGNOSIS — F319 Bipolar disorder, unspecified: Secondary | ICD-10-CM | POA: Diagnosis not present

## 2021-07-04 DIAGNOSIS — Z96651 Presence of right artificial knee joint: Secondary | ICD-10-CM | POA: Diagnosis not present

## 2021-07-04 DIAGNOSIS — E079 Disorder of thyroid, unspecified: Secondary | ICD-10-CM | POA: Diagnosis not present

## 2021-07-06 DIAGNOSIS — E079 Disorder of thyroid, unspecified: Secondary | ICD-10-CM | POA: Diagnosis not present

## 2021-07-06 DIAGNOSIS — D689 Coagulation defect, unspecified: Secondary | ICD-10-CM | POA: Diagnosis not present

## 2021-07-06 DIAGNOSIS — Z471 Aftercare following joint replacement surgery: Secondary | ICD-10-CM | POA: Diagnosis not present

## 2021-07-06 DIAGNOSIS — G8929 Other chronic pain: Secondary | ICD-10-CM | POA: Diagnosis not present

## 2021-07-06 DIAGNOSIS — F319 Bipolar disorder, unspecified: Secondary | ICD-10-CM | POA: Diagnosis not present

## 2021-07-06 DIAGNOSIS — Z96651 Presence of right artificial knee joint: Secondary | ICD-10-CM | POA: Diagnosis not present

## 2021-07-07 DIAGNOSIS — D689 Coagulation defect, unspecified: Secondary | ICD-10-CM | POA: Diagnosis not present

## 2021-07-07 DIAGNOSIS — G8929 Other chronic pain: Secondary | ICD-10-CM | POA: Diagnosis not present

## 2021-07-07 DIAGNOSIS — E079 Disorder of thyroid, unspecified: Secondary | ICD-10-CM | POA: Diagnosis not present

## 2021-07-07 DIAGNOSIS — Z471 Aftercare following joint replacement surgery: Secondary | ICD-10-CM | POA: Diagnosis not present

## 2021-07-07 DIAGNOSIS — F319 Bipolar disorder, unspecified: Secondary | ICD-10-CM | POA: Diagnosis not present

## 2021-07-07 DIAGNOSIS — Z96651 Presence of right artificial knee joint: Secondary | ICD-10-CM | POA: Diagnosis not present

## 2021-07-08 DIAGNOSIS — Z96651 Presence of right artificial knee joint: Secondary | ICD-10-CM | POA: Diagnosis not present

## 2021-07-08 DIAGNOSIS — Z471 Aftercare following joint replacement surgery: Secondary | ICD-10-CM | POA: Diagnosis not present

## 2021-07-09 DIAGNOSIS — G8929 Other chronic pain: Secondary | ICD-10-CM | POA: Diagnosis not present

## 2021-07-09 DIAGNOSIS — E079 Disorder of thyroid, unspecified: Secondary | ICD-10-CM | POA: Diagnosis not present

## 2021-07-09 DIAGNOSIS — Z96651 Presence of right artificial knee joint: Secondary | ICD-10-CM | POA: Diagnosis not present

## 2021-07-09 DIAGNOSIS — Z471 Aftercare following joint replacement surgery: Secondary | ICD-10-CM | POA: Diagnosis not present

## 2021-07-09 DIAGNOSIS — F319 Bipolar disorder, unspecified: Secondary | ICD-10-CM | POA: Diagnosis not present

## 2021-07-09 DIAGNOSIS — D689 Coagulation defect, unspecified: Secondary | ICD-10-CM | POA: Diagnosis not present

## 2021-07-10 DIAGNOSIS — E079 Disorder of thyroid, unspecified: Secondary | ICD-10-CM | POA: Diagnosis not present

## 2021-07-10 DIAGNOSIS — Z471 Aftercare following joint replacement surgery: Secondary | ICD-10-CM | POA: Diagnosis not present

## 2021-07-10 DIAGNOSIS — F319 Bipolar disorder, unspecified: Secondary | ICD-10-CM | POA: Diagnosis not present

## 2021-07-10 DIAGNOSIS — Z96651 Presence of right artificial knee joint: Secondary | ICD-10-CM | POA: Diagnosis not present

## 2021-07-10 DIAGNOSIS — G8929 Other chronic pain: Secondary | ICD-10-CM | POA: Diagnosis not present

## 2021-07-10 DIAGNOSIS — D689 Coagulation defect, unspecified: Secondary | ICD-10-CM | POA: Diagnosis not present

## 2021-07-11 DIAGNOSIS — R051 Acute cough: Secondary | ICD-10-CM | POA: Diagnosis not present

## 2021-07-11 DIAGNOSIS — Z20822 Contact with and (suspected) exposure to covid-19: Secondary | ICD-10-CM | POA: Diagnosis not present

## 2021-07-11 DIAGNOSIS — R059 Cough, unspecified: Secondary | ICD-10-CM | POA: Diagnosis not present

## 2021-07-13 DIAGNOSIS — E079 Disorder of thyroid, unspecified: Secondary | ICD-10-CM | POA: Diagnosis not present

## 2021-07-13 DIAGNOSIS — Z471 Aftercare following joint replacement surgery: Secondary | ICD-10-CM | POA: Diagnosis not present

## 2021-07-13 DIAGNOSIS — Z96651 Presence of right artificial knee joint: Secondary | ICD-10-CM | POA: Diagnosis not present

## 2021-07-13 DIAGNOSIS — F319 Bipolar disorder, unspecified: Secondary | ICD-10-CM | POA: Diagnosis not present

## 2021-07-13 DIAGNOSIS — D689 Coagulation defect, unspecified: Secondary | ICD-10-CM | POA: Diagnosis not present

## 2021-07-13 DIAGNOSIS — G8929 Other chronic pain: Secondary | ICD-10-CM | POA: Diagnosis not present

## 2021-07-14 DIAGNOSIS — Z471 Aftercare following joint replacement surgery: Secondary | ICD-10-CM | POA: Diagnosis not present

## 2021-07-14 DIAGNOSIS — M6281 Muscle weakness (generalized): Secondary | ICD-10-CM | POA: Diagnosis not present

## 2021-07-14 DIAGNOSIS — M25561 Pain in right knee: Secondary | ICD-10-CM | POA: Diagnosis not present

## 2021-07-16 DIAGNOSIS — Z20822 Contact with and (suspected) exposure to covid-19: Secondary | ICD-10-CM | POA: Diagnosis not present

## 2021-07-22 DIAGNOSIS — M6281 Muscle weakness (generalized): Secondary | ICD-10-CM | POA: Diagnosis not present

## 2021-07-22 DIAGNOSIS — Z471 Aftercare following joint replacement surgery: Secondary | ICD-10-CM | POA: Diagnosis not present

## 2021-07-22 DIAGNOSIS — M25561 Pain in right knee: Secondary | ICD-10-CM | POA: Diagnosis not present

## 2021-07-24 ENCOUNTER — Emergency Department (HOSPITAL_COMMUNITY)
Admission: EM | Admit: 2021-07-24 | Discharge: 2021-07-25 | Disposition: A | Discharge status: Home / Self Care | Payer: Medicare Other | Attending: Emergency Medicine | Admitting: Emergency Medicine

## 2021-07-24 ENCOUNTER — Encounter (HOSPITAL_COMMUNITY): Payer: Self-pay | Admitting: Emergency Medicine

## 2021-07-24 DIAGNOSIS — Z91040 Latex allergy status: Secondary | ICD-10-CM | POA: Insufficient documentation

## 2021-07-24 DIAGNOSIS — R112 Nausea with vomiting, unspecified: Secondary | ICD-10-CM | POA: Diagnosis not present

## 2021-07-24 DIAGNOSIS — R197 Diarrhea, unspecified: Secondary | ICD-10-CM | POA: Insufficient documentation

## 2021-07-24 NOTE — ED Triage Notes (Signed)
Pt concerned for dehydration tonight. Pt states she hasn't been able to eat or drink anything. Tonight pt states she began having diarrhea since 2100.

## 2021-07-24 NOTE — ED Provider Notes (Signed)
[facility] EMERGENCY DEPARTMENT Provider Note   CSN: [id number] Arrival date & time: 07/24/21  2320     History  Chief Complaint  Patient presents with   Dehydration    Jenna Contreras is a 76 y.o. female.  Patient reports that she developed diarrhea around 9 AM today and has had persistent diarrhea through the day.  No rectal bleeding or melena.  Patient has had associated nausea but no vomiting.  This has caused her to have very poor oral intake through the day.      Home Medications Prior to Admission medications   Medication Sig Start Date End Date Taking? Authorizing Provider  ondansetron (ZOFRAN-ODT) 4 MG disintegrating tablet '4mg'$  ODT q4 hours prn nausea/vomit 07/25/21  Yes Djuan Talton, Gwenyth Allegra, MD  albuterol [facility] HFA) 108 (90 Base) MCG/ACT inhaler USE 2 INHALATIONS EVERY 4 HOURS AS NEEDED 07/18/20   Martyn Ehrich, NP  albuterol (PROVENTIL) (2.5 MG/3ML) 0.083% nebulizer solution Take 3 mLs (2.5 mg total) by nebulization every 6 (six) hours as needed for wheezing or shortness of breath. 05/14/20   Martyn Ehrich, NP  baclofen (LIORESAL) 10 MG tablet One bid prn caution drowsiness Patient not taking: Reported on 06/03/2021 05/06/21   Kathyrn Drown, MD  chlorpheniramine (CHLOR-TRIMETON) 4 MG tablet Take 1 tablet (4 mg total) by mouth every 4 (four) hours as needed for allergies (Cough). 03/27/21   Martyn Ehrich, NP  diclofenac (VOLTAREN) 75 MG EC tablet Take 1 tablet (75 mg total) by mouth 2 (two) times daily. 05/05/21   Kathyrn Drown, MD  fluticasone (FLONASE) 50 MCG/ACT nasal spray Place 2 sprays into both nostrils daily. 03/12/21   Martyn Ehrich, NP  HYDROcodone-acetaminophen [facility]) 10-325 MG tablet 1 taken 3 times daily as needed for pain caution drowsiness maximum 75/month 05/05/21   Kathyrn Drown, MD  HYDROcodone-acetaminophen (NORCO) 10-325 MG tablet 1 taken 3 times daily as needed for pain maximum 75/month 05/05/21   Kathyrn Drown, MD   HYDROcodone-acetaminophen (NORCO) 10-325 MG tablet 1 taken 3 times daily as needed for pain maximum 75/month 05/05/21   Luking, Elayne Snare, MD  hydrOXYzine (VISTARIL) 25 MG capsule Take 1-2 capsules (25-50 mg total) by mouth 3 (three) times daily as needed for anxiety. Patient not taking: Reported on 06/03/2021 10/29/20   Charlett Blake, MD  levothyroxine (SYNTHROID) 75 MCG tablet Take one tablet po daily 06/16/21   Kathyrn Drown, MD  lidocaine (XYLOCAINE) 1 % (with preservative) injection by Infiltration route. 04/29/21   [provider]  LORazepam (ATIVAN) 0.5 MG tablet Take one tablet po qd prn anxiety caution drowsiness not to take at same time as hydrocodone Patient not taking: Reported on 06/03/2021 02/03/21   Kathyrn Drown, MD  NONFORMULARY OR COMPOUNDED ITEM Apply topically. 12/10/20   [provider]  ondansetron (ZOFRAN) 4 MG tablet Take 1 tablet (4 mg total) by mouth every 6 (six) hours. 09/02/17   Kathyrn Drown, MD  oxyCODONE-acetaminophen (PERCOCET) 5-325 MG tablet One tablet every 4-6 hours as needed for pain-(not to use with hydrocodone) 06/24/21   Luking, Elayne Snare, MD  Polyethyl Glycol-Propyl Glycol (SYSTANE ULTRA OP) Place 1 drop into both eyes at bedtime.    [provider]  rizatriptan (MAXALT) 10 MG tablet May repeat in 2 hours if needed Patient taking differently: Take 10 mg by mouth as needed for migraine. 03/18/20   Kathyrn Drown, MD  rosuvastatin (CRESTOR) 10 MG tablet Take 1 tablet (10  mg total) by mouth daily. Patient not taking: Reported on 06/03/2021 05/05/21 11/01/21  Kathyrn Drown, MD  triamcinolone acetonide (TRIESENCE) 40 MG/ML SUSP Inject into the articular space. Patient not taking: Reported on 06/03/2021 04/29/21   [provider]  trolamine salicylate (ASPERCREME) 10 % cream Apply 1 application topically as needed for muscle pain.    [provider]      Allergies    Betadine [povidone-iodine], Prednisone, Silvadene  [silver sulfadiazine], Sulfa antibiotics, Latex, Wound dressing adhesive, Adhesive [tape], Betadine [povidone iodine], Lamictal [lamotrigine], Levaquin [levofloxacin in d5w], Tizanidine, Trazodone and nefazodone, Zyrtec [cetirizine], Baclofen, Benadryl [diphenhydramine], Cephalexin, Claritin [loratadine], Demerol [meperidine], Doxycycline, Erythromycin, Keflex [cephalexin], Other, and Tetracyclines & related    Review of Systems   Review of Systems  Physical Exam Updated Vital Signs BP 114/65   Pulse 67   Temp 98.1 F (36.7 C) (Oral)   Resp 18   Ht '5\' 1"'$  (1.549 m)   Wt 49.4 kg   SpO2 99%   BMI 20.60 kg/m  Physical Exam Vitals and nursing note reviewed.  Constitutional:      General: She is not in acute distress.    Appearance: She is well-developed.  HENT:     Head: Normocephalic and atraumatic.     Mouth/Throat:     Mouth: Mucous membranes are moist.  Eyes:     General: Vision grossly intact. Gaze aligned appropriately.     Extraocular Movements: Extraocular movements intact.     Conjunctiva/sclera: Conjunctivae normal.  Cardiovascular:     Rate and Rhythm: Normal rate and regular rhythm.     Pulses: Normal pulses.     Heart sounds: Normal heart sounds, S1 normal and S2 normal. No murmur heard.   No friction rub. No gallop.  Pulmonary:     Effort: Pulmonary effort is normal. No respiratory distress.     Breath sounds: Normal breath sounds.  Abdominal:     General: Bowel sounds are normal.     Palpations: Abdomen is soft.     Tenderness: There is no abdominal tenderness. There is no guarding or rebound.     Hernia: No hernia is present.  Musculoskeletal:        General: No swelling.     Cervical back: Full passive range of motion without pain, normal range of motion and neck supple. No spinous process tenderness or muscular tenderness. Normal range of motion.     Right lower leg: No edema.     Left lower leg: No edema.  Skin:    General: Skin is warm and dry.      Capillary Refill: Capillary refill takes less than 2 seconds.     Findings: No ecchymosis, erythema, rash or wound.  Neurological:     General: No focal deficit present.     Mental Status: She is alert and oriented to person, place, and time.     GCS: GCS eye subscore is 4. GCS verbal subscore is 5. GCS motor subscore is 6.     Cranial Nerves: Cranial nerves 2-12 are intact.     Sensory: Sensation is intact.     Motor: Motor function is intact.     Coordination: Coordination is intact.  Psychiatric:        Attention and Perception: Attention normal.        Mood and Affect: Mood normal.        Speech: Speech normal.        Behavior: Behavior normal.    ED  Results / Procedures / Treatments   Labs (all labs ordered are listed, but only abnormal results are displayed) Labs Reviewed  URINALYSIS, ROUTINE W REFLEX MICROSCOPIC    EKG None  Radiology No results found.  Procedures Procedures    Medications Ordered in ED Medications  ondansetron (ZOFRAN-ODT) disintegrating tablet 8 mg (8 mg Oral Given 07/25/21 0013)  loperamide (IMODIUM) capsule 4 mg (4 mg Oral Given 07/25/21 0031)    ED Course/ Medical Decision Making/ A&P                           Medical Decision Making Problems Addressed: Nausea vomiting and diarrhea: acute illness or injury  Amount and/or Complexity of Data Reviewed Labs: ordered.    Details: Patient declined.  She reports that she cannot get needlesticks because she had knee replacement 1 month ago.  Her doctors told her not to get any venipuncture for 3 months.  Risk Prescription drug management.   Patient presents with nausea, poor oral intake with watery diarrhea.  Symptoms present all day.  Patient has a benign, nontender abdominal exam.  No current concern for acute surgical process.  Patient thinks she is dehydrated but her vitals are within normal limits.  She has urinated twice in the last hour.  Doubt she has significant dehydration.  I did  order labs but she declined.  Patient reports that her surgeon told her that she cannot get venipuncture for 3 months because he does not want her knee replacement getting infected.  I did explain to her that it likely does not apply in this case but she is uncomfortable getting labs and therefore will treat symptomatically.  Urinalysis without signs of infection.  Specific gravity is normal, no signs of severe dehydration.  Given Zofran here, tolerating some oral intake.  Will discharge with symptomatic treatment, given return precautions.        Final Clinical Impression(s) / ED Diagnoses Final diagnoses:  Nausea vomiting and diarrhea    Rx / DC Orders ED Discharge Orders          Ordered    ondansetron (ZOFRAN-ODT) 4 MG disintegrating tablet        07/25/21 0110              Orpah Greek, MD 07/25/21 0111

## 2021-07-25 DIAGNOSIS — M25561 Pain in right knee: Secondary | ICD-10-CM | POA: Diagnosis not present

## 2021-07-25 DIAGNOSIS — R112 Nausea with vomiting, unspecified: Secondary | ICD-10-CM | POA: Diagnosis not present

## 2021-07-25 DIAGNOSIS — Z471 Aftercare following joint replacement surgery: Secondary | ICD-10-CM | POA: Diagnosis not present

## 2021-07-25 DIAGNOSIS — M6281 Muscle weakness (generalized): Secondary | ICD-10-CM | POA: Diagnosis not present

## 2021-07-25 LAB — URINALYSIS, ROUTINE W REFLEX MICROSCOPIC
Bilirubin Urine: NEGATIVE
Glucose, UA: NEGATIVE mg/dL
Hgb urine dipstick: NEGATIVE
Ketones, ur: NEGATIVE mg/dL
Leukocytes,Ua: NEGATIVE
Nitrite: NEGATIVE
Protein, ur: NEGATIVE mg/dL
Specific Gravity, Urine: 1.02 (ref 1.005–1.030)
pH: 6 (ref 5.0–8.0)

## 2021-07-25 MED ORDER — ONDANSETRON 4 MG PO TBDP: ORAL_TABLET | ORAL | 0 refills | Status: DC

## 2021-07-25 MED ORDER — ONDANSETRON 8 MG PO TBDP
8.0000 mg | ORAL_TABLET | Freq: Once | ORAL | Status: AC
  Administered 2021-07-25: 8 mg via ORAL
  Filled 2021-07-25: qty 1

## 2021-07-25 MED ORDER — SODIUM CHLORIDE 0.9 % IV BOLUS: 500.0000 mL | Freq: Once | INTRAVENOUS | Status: DC

## 2021-07-25 MED ORDER — LOPERAMIDE HCL 2 MG PO CAPS
4.0000 mg | ORAL_CAPSULE | Freq: Once | ORAL | Status: AC
  Administered 2021-07-25: 4 mg via ORAL
  Filled 2021-07-25: qty 2

## 2021-07-25 MED ORDER — ONDANSETRON HCL 4 MG/2ML IJ SOLN: 4.0000 mg | Freq: Once | INTRAMUSCULAR | Status: DC

## 2021-07-25 NOTE — ED Notes (Signed)
  Went to start IV and collect labs from patient but she refused stating she was told by her doctor that she shouldn't have any cut or needle prick.  Patient requested to speak to Dr Betsey Holiday about the blood work.  Dr Betsey Holiday notified and went to speak with patient.

## 2021-07-29 DIAGNOSIS — Z471 Aftercare following joint replacement surgery: Secondary | ICD-10-CM | POA: Diagnosis not present

## 2021-07-29 DIAGNOSIS — M25561 Pain in right knee: Secondary | ICD-10-CM | POA: Diagnosis not present

## 2021-07-29 DIAGNOSIS — M6281 Muscle weakness (generalized): Secondary | ICD-10-CM | POA: Diagnosis not present

## 2021-07-30 ENCOUNTER — Telehealth: Payer: Self-pay | Admitting: Family Medicine

## 2021-07-30 NOTE — Telephone Encounter (Signed)
Left message for patient to call back and schedule Medicare Annual Wellness Visit (AWV) in office.   If unable to come into the office for AWV,  please offer to do virtually or by telephone.  Last AWV: 03/18/2020  Please schedule at anytime with [facility]-Nurse Health Advisor.  30 minute appointment for Virtual or phone  45 minute appointment for in office or Initial virtual/phone  Any questions, please contact me at [phone]

## 2021-08-01 DIAGNOSIS — Z471 Aftercare following joint replacement surgery: Secondary | ICD-10-CM | POA: Diagnosis not present

## 2021-08-01 DIAGNOSIS — M25561 Pain in right knee: Secondary | ICD-10-CM | POA: Diagnosis not present

## 2021-08-01 DIAGNOSIS — M6281 Muscle weakness (generalized): Secondary | ICD-10-CM | POA: Diagnosis not present

## 2021-08-06 ENCOUNTER — Ambulatory Visit (INDEPENDENT_AMBULATORY_CARE_PROVIDER_SITE_OTHER): Payer: Medicare Other | Admitting: Family Medicine

## 2021-08-06 VITALS — BP 106/77 | HR 82 | Temp 98.1°F | Ht 61.0 in | Wt 109.0 lb

## 2021-08-06 DIAGNOSIS — M170 Bilateral primary osteoarthritis of knee: Secondary | ICD-10-CM | POA: Diagnosis not present

## 2021-08-06 DIAGNOSIS — I2510 Atherosclerotic heart disease of native coronary artery without angina pectoris: Secondary | ICD-10-CM

## 2021-08-06 DIAGNOSIS — R52 Pain, unspecified: Secondary | ICD-10-CM | POA: Diagnosis not present

## 2021-08-06 MED ORDER — HYDROCODONE-ACETAMINOPHEN 10-325 MG PO TABS: ORAL_TABLET | ORAL | 0 refills | Status: DC

## 2021-08-06 NOTE — Patient Instructions (Signed)
Use OTC Miralax  1/2 capful in 8 oz daily Goal soft BM If need be 1 whole capful in 8 oz of water  Follow up in 3 months  Sooner if problems

## 2021-08-06 NOTE — Progress Notes (Signed)
   Subjective:    Patient ID: Jenna Contreras, female    DOB: 1945/12/21, 76 y.o.   MRN: [id number]  HPI  Patient here for follow up on knee surgery. This patient was seen today for chronic pain  The medication list was reviewed and updated.  Location of Pain for which the patient has been treated with regarding narcotics: Patient has midthoracic pain from previous surgery that failed to take care of her pain and discomfort, also bilateral knee pain but that is doing much better since having knee replacement surgery  Onset of this pain: Present for years   -Compliance with medication: Good compliance  - Number patient states they take daily: She is cut back to no more than 2/day which is good  -when was the last dose patient took?  Earlier today  The patient was advised the importance of maintaining medication and not using illegal substances with these.  Here for refills and follow up  The patient was educated that we can provide 3 monthly scripts for their medication, it is their responsibility to follow the instructions.  Side effects or complications from medications: Denies side effects  Patient is aware that pain medications are meant to minimize the severity of the pain to allow their pain levels to improve to allow for better function. They are aware of that pain medications cannot totally remove their pain.  Due for UDT ( at least once per year) : February 2023  Scale of 1 to 10 ( 1 is least 10 is most) Your pain level without the medicine: 8 Your pain level with medication 3  Scale 1 to 10 ( 1-helps very little, 10 helps very well) How well does your pain medication reduce your pain so you can function better through out the day? 8  Quality of the pain: Throbbing aching pain  Persistence of the pain: It is present all the time  Modifying factors: Worse with activity      Review of Systems     Objective:   Physical Exam General-in no acute  distress Eyes-no discharge Lungs-respiratory rate normal, CTA CV-no murmurs,RRR Extremities skin warm dry no edema Neuro grossly normal Behavior normal, alert        Assessment & Plan:  The patient was seen in followup for chronic pain. A review over at their current pain status was discussed. Drug registry was checked. Prescriptions were given.  Regular follow-up recommended. Discussion was held regarding the importance of compliance with medication as well as pain medication contract.  Patient was informed that medication may cause drowsiness and should not be combined  with other medications/alcohol or street drugs. If the patient feels medication is causing altered alertness then do not drive or operate dangerous equipment.  Should be noted that the patient appears to be meeting appropriate use of opioids and response.  Evidenced by improved function and decent pain control without significant side effects and no evidence of overt aberrancy issues.  Upon discussion with the patient today they understand that opioid therapy is optional and they feel that the pain has been refractory to reasonable conservative measures and is significant and affecting quality of life enough to warrant ongoing therapy and wishes to continue opioids.  Refills were provided.  Medications were sent in New prescriptions written to reflect 2/day She is to follow-up in 3 months

## 2021-08-07 DIAGNOSIS — M25551 Pain in right hip: Secondary | ICD-10-CM | POA: Diagnosis not present

## 2021-08-07 DIAGNOSIS — M7061 Trochanteric bursitis, right hip: Secondary | ICD-10-CM | POA: Diagnosis not present

## 2021-08-11 DIAGNOSIS — M7061 Trochanteric bursitis, right hip: Secondary | ICD-10-CM | POA: Diagnosis not present

## 2021-09-05 DIAGNOSIS — M180 Bilateral primary osteoarthritis of first carpometacarpal joints: Secondary | ICD-10-CM | POA: Diagnosis not present

## 2021-09-05 DIAGNOSIS — M19049 Primary osteoarthritis, unspecified hand: Secondary | ICD-10-CM | POA: Diagnosis not present

## 2021-09-10 ENCOUNTER — Other Ambulatory Visit: Payer: Self-pay | Admitting: Family Medicine

## 2021-09-11 ENCOUNTER — Other Ambulatory Visit: Payer: Self-pay | Admitting: *Deleted

## 2021-09-11 NOTE — Telephone Encounter (Signed)
Faxe received requesting refill of Maxalt '10mg'$  [facility]. Prescription was sent electronically to pharmacy

## 2021-09-12 ENCOUNTER — Telehealth: Payer: Self-pay | Admitting: Family Medicine

## 2021-09-12 MED ORDER — BACLOFEN 10 MG PO TABS: ORAL_TABLET | ORAL | 0 refills | Status: DC

## 2021-09-12 NOTE — Telephone Encounter (Signed)
1.  Her last prescription of baclofen was for twice daily Given the strong medications that she is on otherwise including pain medicine I would recommend baclofen to be used as needed ideally twice a day 1 twice daily, #30 that should be enough until her new medicine gets in front mail-order

## 2021-09-12 NOTE — Telephone Encounter (Signed)
Patient informed of md message and recommendations. Verbalized understanding. Sent in Baclofen 10, 1 twice a day # 30 to [facility] [employer].

## 2021-09-12 NOTE — Telephone Encounter (Signed)
Nurses-back in February I prescribed this medication twice daily as needed I am sympathetic to her situation but given her age, and the other medicines she is on including pain medicine I do not recommend muscle relaxers and definitely do not recommend them 3 times per day I recommend sticking with twice per day please see previous message thank you

## 2021-09-12 NOTE — Telephone Encounter (Signed)
Patient last seen on 08/06/21 for primary osteoarthritis of both knees. Please advise. Thank you

## 2021-09-12 NOTE — Telephone Encounter (Signed)
Patient is requesting a refill be called into [facility] in [place] for her Baclofen 10 mg she takes 3 a day until her [facility] comes in [facility] . She states completely out.please advise

## 2021-09-12 NOTE — Telephone Encounter (Signed)
Patient informed of md recommendations.  Patient states that the doctor she sees in [place] told her to take the medication 3 x per day every day. She says he wanted her to take it because the muscle tense up so bad. Please advise. Thank you

## 2021-10-03 ENCOUNTER — Other Ambulatory Visit: Payer: Self-pay | Admitting: *Deleted

## 2021-10-03 ENCOUNTER — Telehealth: Payer: Self-pay | Admitting: Family Medicine

## 2021-10-03 MED ORDER — DICLOFENAC SODIUM 75 MG PO TBEC: 75.0000 mg | DELAYED_RELEASE_TABLET | Freq: Two times a day (BID) | ORAL | 0 refills | Status: DC

## 2021-10-03 MED ORDER — RIZATRIPTAN BENZOATE 10 MG PO TABS: ORAL_TABLET | ORAL | 1 refills | Status: DC

## 2021-10-03 NOTE — Telephone Encounter (Signed)
Prescriptions faxed.  

## 2021-10-03 NOTE — Telephone Encounter (Signed)
May have refill on each we will see her at her follow-up in August

## 2021-10-03 NOTE — Telephone Encounter (Signed)
Patient is requesting refill on diclifenac '75mg'$  90 day supply and rizatriptan 10 mg 90 day supply [facility]

## 2021-10-03 NOTE — Telephone Encounter (Signed)
Please advise. Thank you

## 2021-10-29 ENCOUNTER — Other Ambulatory Visit: Payer: Self-pay | Admitting: *Deleted

## 2021-10-29 NOTE — Patient Outreach (Signed)
  Care Coordination   10/29/2021  Name: Jenna Contreras MRN: [id number] DOB: 10-29-1945   Care Coordination Outreach Attempts:  An unsuccessful telephone outreach was attempted today to offer the patient information about available care coordination services as a benefit of their health plan. HIPAA compliant message left on voicemail, providing contact information for CSW, encouraging patient to return CSW's call at her earliest convenience.  Follow Up Plan:  Additional outreach attempts will be made to offer the patient care coordination information and services.   Encounter Outcome:  No Answer.   Care Coordination Interventions Activated:  No.    Care Coordination Interventions:  No, not indicated.    Nat Christen, BSW, MSW, LCSW  Licensed Education officer, environmental [facility]  Mailing [place] [address], [place], [place] [address] Physical Address-[address], [place], [place] [address] Toll Free Main # [phone] Fax # [phone] Cell # [phone] [name]

## 2021-10-31 DIAGNOSIS — Z981 Arthrodesis status: Secondary | ICD-10-CM | POA: Diagnosis not present

## 2021-10-31 DIAGNOSIS — M62830 Muscle spasm of back: Secondary | ICD-10-CM | POA: Diagnosis not present

## 2021-11-03 DIAGNOSIS — Z96651 Presence of right artificial knee joint: Secondary | ICD-10-CM | POA: Diagnosis not present

## 2021-11-03 DIAGNOSIS — M25561 Pain in right knee: Secondary | ICD-10-CM | POA: Diagnosis not present

## 2021-11-06 ENCOUNTER — Ambulatory Visit: Payer: Medicare Other | Admitting: Family Medicine

## 2021-11-18 ENCOUNTER — Other Ambulatory Visit: Payer: Self-pay | Admitting: *Deleted

## 2021-11-18 NOTE — Patient Outreach (Signed)
  Care Coordination   11/18/2021  Name: Merit Gadsby MRN: [id number] DOB: 01-31-1946   Care Coordination Outreach Attempts:  A second unsuccessful outreach was attempted today to offer the patient with information about available care coordination services as a benefit of their health plan.   HIPAA compliant message left on voicemail, providing contact information for CSW, encouraging patient to return CSW's call at her earliest convenience.  Follow Up Plan:  Additional outreach attempts will be made to offer the patient care coordination information and services.   Encounter Outcome:  No Answer.   Care Coordination Interventions Activated:  No.    Care Coordination Interventions:  No, not indicated.    Nat Christen, BSW, MSW, LCSW  Licensed Education officer, environmental Health System  Mailing [place] [address], [place], [place] [address] Physical Address[address], [place], [place] [address] Toll Free Main # [phone] Fax # [phone] Cell # [phone] [email or web address]

## 2021-11-25 ENCOUNTER — Ambulatory Visit: Payer: Self-pay | Admitting: *Deleted

## 2021-11-25 ENCOUNTER — Encounter: Payer: Self-pay | Admitting: *Deleted
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
## 2021-11-25 NOTE — Patient Instructions (Addendum)
Visit Information  Thank you for taking time to visit with me today. Please don't hesitate to contact me if I can be of assistance to you.   Please call the care guide team at [phone] if you need to cancel or reschedule your appointment.   If you are experiencing a Mental Health or Behavioral Health Crisis or need someone to talk to, please call the Suicide and Crisis Lifeline: 988 call the [place] National Suicide Prevention Lifeline: 1-800-273-8255 or TTY: 1-800-799-4 TTY (1-800-799-4889) to talk to a trained counselor call 1-800-273-TALK (toll free, 24 hour hotline) go to [facility] [address], [place] ([phone]) call the [place] Crisis Line: 800-939-9988 call 911  Patient verbalizes understanding of instructions and care plan provided today and agrees to view in MyChart. Active MyChart status and patient understanding of how to access instructions and care plan via MyChart confirmed with patient.     No further follow up required.  Dominiq Fontaine, BSW, MSW, LCSW  Licensed Clinical Social Worker  [facility] [place] [facility]  Mailing Address-[address], [place], [place] [address] Physical Address-[address], [place], [place] [address] Toll Free Main # [phone] Fax # [phone] Cell # [phone] [email or web address]            

## 2021-12-10 ENCOUNTER — Ambulatory Visit (INDEPENDENT_AMBULATORY_CARE_PROVIDER_SITE_OTHER): Payer: Medicare Other | Admitting: Family Medicine

## 2021-12-10 ENCOUNTER — Telehealth: Payer: Self-pay | Admitting: Family Medicine

## 2021-12-10 ENCOUNTER — Encounter: Payer: Self-pay | Admitting: Family Medicine

## 2021-12-10 VITALS — BP 113/68 | HR 74 | Wt 114.0 lb

## 2021-12-10 DIAGNOSIS — E038 Other specified hypothyroidism: Secondary | ICD-10-CM | POA: Diagnosis not present

## 2021-12-10 DIAGNOSIS — E559 Vitamin D deficiency, unspecified: Secondary | ICD-10-CM | POA: Diagnosis not present

## 2021-12-10 DIAGNOSIS — M546 Pain in thoracic spine: Secondary | ICD-10-CM | POA: Diagnosis not present

## 2021-12-10 DIAGNOSIS — Z79891 Long term (current) use of opiate analgesic: Secondary | ICD-10-CM | POA: Diagnosis not present

## 2021-12-10 DIAGNOSIS — I2510 Atherosclerotic heart disease of native coronary artery without angina pectoris: Secondary | ICD-10-CM

## 2021-12-10 DIAGNOSIS — R52 Pain, unspecified: Secondary | ICD-10-CM

## 2021-12-10 DIAGNOSIS — G8929 Other chronic pain: Secondary | ICD-10-CM | POA: Diagnosis not present

## 2021-12-10 MED ORDER — OXYCODONE-ACETAMINOPHEN 5-325 MG PO TABS: ORAL_TABLET | ORAL | 0 refills | Status: DC

## 2021-12-10 NOTE — Progress Notes (Signed)
er  Subjective:    Patient ID: Jenna Contreras, female    DOB: 11/15/1945, 76 y.o.   MRN: [id number]  HPI This patient was seen today for chronic pain  The medication list was reviewed and updated.  Location of Pain for which the patient has been treated with regarding narcotics:   Onset of this pain:    -Compliance with medication: Hydrocodone 10-325 mg  - Number patient states they take daily: 2  -when was the last dose patient took?  2 :00 this morning  The patient was advised the importance of maintaining medication and not using illegal substances with these.  Here for refills and follow up  The patient was educated that we can provide 3 monthly scripts for their medication, it is their responsibility to follow the instructions.  Side effects or complications from medications: constipation   Patient is aware that pain medications are meant to minimize the severity of the pain to allow their pain levels to improve to allow for better function. They are aware of that pain medications cannot totally remove their pain.  Due for UDT ( at least once per year) : completed 05/05/21  Scale of 1 to 10 ( 1 is least 10 is most) Your pain level without the medicine: 10 Your pain level with medication: 8  Scale 1 to 10 ( 1-helps very little, 10 helps very well) How well does your pain medication reduce your pain so you can function better through out the day? 1  Quality of the pain:   Persistence of the pain:   Modifying factors:   Pt requesting stronger pain meds due to excruciating right knee pain.       Review of Systems     Objective:   Physical Exam Subjective discomfort upper back she has a rod through her back she also has right knee arthritis despite having surgery has ongoing pain Extremities no edema skin warm dry       Assessment & Plan:   1. Other specified hypothyroidism Thyroid decent control in the past she is concerned that the levothyroxine  is not helping her it is necessary to do a panel to see if her numbers are where they need to be she is very convinced it is from medications. - Magnesium - VITAMIN D 25 Hydroxy (Vit-D Deficiency, Fractures) - TSH - T4, Free - T3 - Cortisol  2. Chronic bilateral thoracic back pain Ongoing thoracic back pain despite previous surgery she does not feel the pain medicine no longer helps it is quite possible she has become used to it we could switch over to oxycodone but I would recommend lower dose every 4-6 hours no more than 4/day   - Magnesium - VITAMIN D 25 Hydroxy (Vit-D Deficiency, Fractures) - TSH - T4, Free - T3 - Cortisol  3. Vitamin D deficiency Check vitamin D - Magnesium - VITAMIN D 25 Hydroxy (Vit-D Deficiency, Fractures) - TSH - T4, Free - T3 - Cortisol  4. Encounter for pain management The patient was seen in followup for chronic pain. A review over at their current pain status was discussed. Drug registry was checked. Prescriptions were given.  Regular follow-up recommended. Discussion was held regarding the importance of compliance with medication as well as pain medication contract.  Patient was informed that medication may cause drowsiness and should not be combined  with other medications/alcohol or street drugs. If the patient feels medication is causing altered alertness then do not drive or operate dangerous  equipment.  Should be noted that the patient appears to be meeting appropriate use of opioids and response.  Evidenced by improved function and decent pain control without significant side effects and no evidence of overt aberrancy issues.  Upon discussion with the patient today they understand that opioid therapy is optional and they feel that the pain has been refractory to reasonable conservative measures and is significant and affecting quality of life enough to warrant ongoing therapy and wishes to continue opioids.  Refills were provided.  We will be  sending in her pain medicines accordingly with a follow-up in a few weeks - ToxASSURE Select 13 (MW), Urine

## 2021-12-10 NOTE — Telephone Encounter (Signed)
Patient called back to let you know her pharmacy has oxycodone  '5mg'$  but patient states she is alreadt taking 10 mg they have percocet 7.5 mg but ou of the other mg . She state hydrocodone not helping with pain. [facility] in [place].

## 2021-12-11 ENCOUNTER — Other Ambulatory Visit: Payer: Self-pay | Admitting: Family Medicine

## 2021-12-11 DIAGNOSIS — Z96651 Presence of right artificial knee joint: Secondary | ICD-10-CM | POA: Diagnosis not present

## 2021-12-11 MED ORDER — OXYCODONE HCL 5 MG PO TABS
ORAL_TABLET | ORAL | 0 refills | Status: DC
Start: 2021-12-11 — End: 2021-12-24

## 2021-12-11 NOTE — Telephone Encounter (Signed)
[facility] has plain oxycodone this was sent to them 5 mg tablet 1 every 6 hours as needed for pain caution drowsiness maximum 4/day I sent in enough to last her until her next follow-up visit Keep follow-up visit later in October

## 2021-12-11 NOTE — Progress Notes (Signed)
Step in

## 2021-12-11 NOTE — Telephone Encounter (Signed)
Pt contacted and verbalized understanding.  

## 2021-12-12 ENCOUNTER — Other Ambulatory Visit: Payer: Self-pay | Admitting: Family Medicine

## 2021-12-12 ENCOUNTER — Encounter: Payer: Self-pay | Admitting: Family Medicine

## 2021-12-12 LAB — TOXASSURE SELECT 13 (MW), URINE

## 2021-12-12 NOTE — Progress Notes (Signed)
Please mail to the patient 

## 2021-12-22 DIAGNOSIS — E559 Vitamin D deficiency, unspecified: Secondary | ICD-10-CM | POA: Diagnosis not present

## 2021-12-22 DIAGNOSIS — M546 Pain in thoracic spine: Secondary | ICD-10-CM | POA: Diagnosis not present

## 2021-12-22 DIAGNOSIS — E038 Other specified hypothyroidism: Secondary | ICD-10-CM | POA: Diagnosis not present

## 2021-12-22 DIAGNOSIS — G8929 Other chronic pain: Secondary | ICD-10-CM | POA: Diagnosis not present

## 2021-12-23 DIAGNOSIS — Z96651 Presence of right artificial knee joint: Secondary | ICD-10-CM | POA: Diagnosis not present

## 2021-12-23 DIAGNOSIS — R200 Anesthesia of skin: Secondary | ICD-10-CM | POA: Diagnosis not present

## 2021-12-23 DIAGNOSIS — M25561 Pain in right knee: Secondary | ICD-10-CM | POA: Diagnosis not present

## 2021-12-23 LAB — TSH: TSH: 1.22 u[IU]/mL (ref 0.450–4.500)

## 2021-12-23 LAB — CORTISOL: Cortisol: 12.9 ug/dL (ref 6.2–19.4)

## 2021-12-23 LAB — MAGNESIUM: Magnesium: 2.5 mg/dL — ABNORMAL HIGH (ref 1.6–2.3)

## 2021-12-23 LAB — VITAMIN D 25 HYDROXY (VIT D DEFICIENCY, FRACTURES): Vit D, 25-Hydroxy: 65.2 ng/mL (ref 30.0–100.0)

## 2021-12-23 LAB — T3: T3, Total: 56 ng/dL — ABNORMAL LOW (ref 71–180)

## 2021-12-23 LAB — T4, FREE: Free T4: 1.79 ng/dL — ABNORMAL HIGH (ref 0.82–1.77)

## 2021-12-24 ENCOUNTER — Ambulatory Visit (INDEPENDENT_AMBULATORY_CARE_PROVIDER_SITE_OTHER): Payer: Medicare Other | Admitting: Family Medicine

## 2021-12-24 VITALS — BP 126/75 | HR 69 | Temp 97.0°F | Ht 61.0 in | Wt 114.0 lb

## 2021-12-24 DIAGNOSIS — M62830 Muscle spasm of back: Secondary | ICD-10-CM | POA: Diagnosis not present

## 2021-12-24 DIAGNOSIS — M546 Pain in thoracic spine: Secondary | ICD-10-CM

## 2021-12-24 DIAGNOSIS — G5793 Unspecified mononeuropathy of bilateral lower limbs: Secondary | ICD-10-CM | POA: Diagnosis not present

## 2021-12-24 DIAGNOSIS — R52 Pain, unspecified: Secondary | ICD-10-CM

## 2021-12-24 DIAGNOSIS — I2510 Atherosclerotic heart disease of native coronary artery without angina pectoris: Secondary | ICD-10-CM

## 2021-12-24 DIAGNOSIS — G8929 Other chronic pain: Secondary | ICD-10-CM

## 2021-12-24 DIAGNOSIS — E038 Other specified hypothyroidism: Secondary | ICD-10-CM

## 2021-12-24 MED ORDER — OXYCODONE HCL 5 MG PO TABS: ORAL_TABLET | ORAL | 0 refills | Status: DC

## 2021-12-24 MED ORDER — PREGABALIN 25 MG PO CAPS: 25.0000 mg | ORAL_CAPSULE | Freq: Two times a day (BID) | ORAL | 0 refills | Status: DC

## 2021-12-24 MED ORDER — RIZATRIPTAN BENZOATE 10 MG PO TABS: ORAL_TABLET | ORAL | 1 refills | Status: DC

## 2021-12-24 NOTE — Progress Notes (Signed)
Subjective:    Patient ID: Jenna Contreras, female    DOB: 08-Jun-1945, 76 y.o.   MRN: [id number]  HPI  Medication follow up  This patient was seen today for chronic pain  The medication list was reviewed and updated.  Location of Pain for which the patient has been treated with regarding narcotics: Neck mid back low back  Onset of this pain: Been present for years worse since she has had surgery and rods placed   -Compliance with medication: She states compliance takes no more than 4/day on days that she drives she does not take the medicine when she drives  - Number patient states they take daily: 4  -when was the last dose patient took?  Earlier today  The patient was advised the importance of maintaining medication and not using illegal substances with these.  Here for refills and follow up  The patient was educated that we can provide 3 monthly scripts for their medication, it is their responsibility to follow the instructions.  Side effects or complications from medications: Denies side effects  Patient is aware that pain medications are meant to minimize the severity of the pain to allow their pain levels to improve to allow for better function. They are aware of that pain medications cannot totally remove their pain.  Due for UDT ( at least once per year) : October 2023  Scale of 1 to 10 ( 1 is least 10 is most) Your pain level without the medicine: 8 Your pain level with medication 3  Scale 1 to 10 ( 1-helps very little, 10 helps very well) How well does your pain medication reduce your pain so you can function better through out the day? 7-8  Quality of the pain: Throbbing aching  Persistence of the pain: Present all the time  Modifying factors: Worse with activity  Patient is on a pain management contract Patient knows never to take somebody else's medicines She knows never to share her medicines Keep them in a safe place She denies any setbacks or  drowsiness with the medicine      Review of Systems     Objective:   Physical Exam  General-in no acute distress Eyes-no discharge Lungs-respiratory rate normal, CTA CV-no murmurs,RRR Extremities skin warm dry no edema Neuro grossly normal Behavior normal, alert   She brings all her pill bottles today we reviewed over all of these.    Assessment & Plan:   1. Chronic bilateral thoracic back pain Chronic mid back low back pain she has rods in her back Surgeon states they would not recommend any additional surgery She does try to stay relatively active does try to do some stretching  2. Encounter for pain management The patient was seen in followup for chronic pain. A review over at their current pain status was discussed. Drug registry was checked. Prescriptions were given.  Regular follow-up recommended. Discussion was held regarding the importance of compliance with medication as well as pain medication contract.  Patient was informed that medication may cause drowsiness and should not be combined  with other medications/alcohol or street drugs. If the patient feels medication is causing altered alertness then do not drive or operate dangerous equipment.  Should be noted that the patient appears to be meeting appropriate use of opioids and response.  Evidenced by improved function and decent pain control without significant side effects and no evidence of overt aberrancy issues.  Upon discussion with the patient today they understand that opioid  therapy is optional and they feel that the pain has been refractory to reasonable conservative measures and is significant and affecting quality of life enough to warrant ongoing therapy and wishes to continue opioids.  Refills were provided.  2 prescriptions were sent in.  On her next visit if the pain is doing better she states she may want to switch back to the hydrocodone She is supposed to have her knee looked at by the  specialist   3. Other specified hypothyroidism We have referred her to endocrinology.  She was convinced that the medicine made her feel worse.  Her numbers were off.  This is outside of our area of expertise in regards to trying to handle this further  4. Back spasm She has ongoing intermittent back spasms for which she states baclofen helps.  I informed her that baclofen can cause excessive drowsiness when taken with oxycodone and can be dangerous.  Patient is adamant that she has taken both without having that problem but she does state that she tries to avoid taking the oxycodone if she has to take the muscle relaxer.  I have encouraged her to use a muscle relaxer very sparingly and never take oxycodone with it.  5. Neuropathic pain of both legs Her orthopedic specialist/back specialist placed her on Lyrica 75 mg twice daily she states this caused excessive drowsiness.  I told her that the dose was too strong.  Recommended 25 mg twice daily.  Start off with just 1 each evening if it causes too much drowsiness do not take daytime medicine

## 2022-01-15 ENCOUNTER — Other Ambulatory Visit: Payer: Self-pay | Admitting: *Deleted

## 2022-01-15 DIAGNOSIS — E038 Other specified hypothyroidism: Secondary | ICD-10-CM

## 2022-01-15 MED ORDER — LEVOTHYROXINE SODIUM 75 MCG PO TABS: ORAL_TABLET | ORAL | 0 refills | Status: DC

## 2022-01-27 DIAGNOSIS — M1712 Unilateral primary osteoarthritis, left knee: Secondary | ICD-10-CM | POA: Diagnosis not present

## 2022-02-11 ENCOUNTER — Other Ambulatory Visit: Payer: Self-pay | Admitting: *Deleted

## 2022-02-11 ENCOUNTER — Other Ambulatory Visit: Payer: Self-pay | Admitting: Family Medicine

## 2022-02-11 MED ORDER — BACLOFEN 10 MG PO TABS: ORAL_TABLET | ORAL | 0 refills | Status: DC

## 2022-02-11 NOTE — Telephone Encounter (Signed)
Patient left message on machine the she is almost out of her Baclofen and it will not be here from mail order in time and she needs a short term script sent to [facility] to hold her over till it arrives in the mail

## 2022-02-11 NOTE — Telephone Encounter (Signed)
Baclofen 10 mg 1 twice daily 2 weeks supply If needing something different please let us know

## 2022-02-18 ENCOUNTER — Encounter: Payer: Self-pay | Admitting: Family Medicine

## 2022-02-18 ENCOUNTER — Ambulatory Visit (INDEPENDENT_AMBULATORY_CARE_PROVIDER_SITE_OTHER): Payer: Medicare Other | Admitting: Family Medicine

## 2022-02-18 VITALS — BP 110/60 | HR 70 | Temp 97.7°F | Wt 121.4 lb

## 2022-02-18 DIAGNOSIS — M170 Bilateral primary osteoarthritis of knee: Secondary | ICD-10-CM

## 2022-02-18 DIAGNOSIS — G8929 Other chronic pain: Secondary | ICD-10-CM

## 2022-02-18 DIAGNOSIS — I2510 Atherosclerotic heart disease of native coronary artery without angina pectoris: Secondary | ICD-10-CM

## 2022-02-18 DIAGNOSIS — M546 Pain in thoracic spine: Secondary | ICD-10-CM

## 2022-02-18 DIAGNOSIS — R52 Pain, unspecified: Secondary | ICD-10-CM

## 2022-02-18 DIAGNOSIS — G5793 Unspecified mononeuropathy of bilateral lower limbs: Secondary | ICD-10-CM

## 2022-02-18 MED ORDER — OXYCODONE-ACETAMINOPHEN 10-325 MG PO TABS: ORAL_TABLET | ORAL | 0 refills | Status: DC

## 2022-02-18 MED ORDER — DICLOFENAC SODIUM 75 MG PO TBEC: 75.0000 mg | DELAYED_RELEASE_TABLET | Freq: Two times a day (BID) | ORAL | 3 refills | Status: DC

## 2022-02-18 MED ORDER — LEVOTHYROXINE SODIUM 75 MCG PO TABS: ORAL_TABLET | ORAL | 1 refills | Status: DC

## 2022-02-18 NOTE — Progress Notes (Signed)
    Subjective:    Patient ID: Jenna Contreras, female    DOB: 1945-05-27, 76 y.o.   MRN: [id number]  HPI This patient was seen today for chronic pain  The medication list was reviewed and updated.  Location of Pain for which the patient has been treated with regarding narcotics: back pain  Onset of this pain: years   -Compliance with medication: yes  - Number patient states they take daily: 3 to 4   -when was the last dose patient took? Last night 10pm  The patient was advised the importance of maintaining medication and not using illegal substances with these.  Here for refills and follow up  The patient was educated that we can provide 3 monthly scripts for their medication, it is their responsibility to follow the instructions.  Side effects or complications from medications: no  Patient is aware that pain medications are meant to minimize the severity of the pain to allow their pain levels to improve to allow for better function. They are aware of that pain medications cannot totally remove their pain.  Due for UDT ( at least once per year) : yes,12/10/2021  Scale of 1 to 10 ( 1 is least 10 is most) Your pain level without the medicine: 10 Your pain level with medication 10  The patient relates that the 5 mg oxycodone did not help her pain She even took some of her leftover hydrocodone it did not help In the past she states she has used 10 mg oxycodone and it did help She is requesting a higher dose  Scale 1 to 10 ( 1-helps very little, 10 helps very well) How well does your pain medication reduce your pain so you can function better through out the day? 10  Quality of the pain: throbbing pain    Persistence of the pain: constant  Modifying factors: worse        Review of Systems     Objective:   Physical Exam  General-in no acute distress Eyes-no discharge Lungs-respiratory rate normal, CTA CV-no murmurs,RRR Extremities skin warm dry no  edema Neuro grossly normal Behavior normal, alert Subjective discomfort in both legs and knees and back      Assessment & Plan:  Patient relates a lot of muscle spasms back pain pain radiating down the legs and bilateral knee pain she states the oxycodone 5 mg did not help her pain She had some leftover hydrocodone she took it did not help her pain In the past she did take 10 mg of oxycodone and it did help She is requesting additional prescription to be sent in of the 10 mg She uses muscle relaxers intermittently I have instructed her never to use a muscle relaxer with pain medicine She has been on muscle relaxers ever since her back surgery which did not adequately help her back to her situation.  She does not drive with muscle relaxers or with pain medicine She will follow-up within 4 weeks If the 10 mg 4 times daily is not doing enough to adequately help her back pain or her knee pain she will need to see pain management

## 2022-02-18 NOTE — Patient Instructions (Signed)
We have increased the dose of your pain medicine due to the amount of pain you are having  10 mg oxycodone/325 mg acetaminophen-take 1 every 6 hours as needed for pain maximum 4/day.  I would recommend not taking these with muscle relaxers.  If this medication causes excessive drowsiness please let us know.  I agree with you that it is best not to drive while on this medicine.  We will see you back in 4 weeks.  If you are having any problems before then let us know.  Mina

## 2022-02-20 NOTE — Progress Notes (Addendum)
Note has been reassigned from [place].

## 2022-03-18 ENCOUNTER — Ambulatory Visit (INDEPENDENT_AMBULATORY_CARE_PROVIDER_SITE_OTHER): Payer: Medicare Other | Admitting: Family Medicine

## 2022-03-18 ENCOUNTER — Encounter: Payer: Self-pay | Admitting: Family Medicine

## 2022-03-18 VITALS — BP 104/65 | Wt 119.0 lb

## 2022-03-18 DIAGNOSIS — R52 Pain, unspecified: Secondary | ICD-10-CM

## 2022-03-18 DIAGNOSIS — M546 Pain in thoracic spine: Secondary | ICD-10-CM

## 2022-03-18 DIAGNOSIS — F325 Major depressive disorder, single episode, in full remission: Secondary | ICD-10-CM | POA: Insufficient documentation

## 2022-03-18 DIAGNOSIS — G8929 Other chronic pain: Secondary | ICD-10-CM

## 2022-03-18 MED ORDER — DICLOFENAC SODIUM 75 MG PO TBEC: 75.0000 mg | DELAYED_RELEASE_TABLET | Freq: Two times a day (BID) | ORAL | 3 refills | Status: DC

## 2022-03-18 MED ORDER — OXYCODONE-ACETAMINOPHEN 10-325 MG PO TABS: ORAL_TABLET | ORAL | 0 refills | Status: DC

## 2022-03-18 MED ORDER — OXYCODONE-ACETAMINOPHEN 10-325 MG PO TABS
ORAL_TABLET | ORAL | 0 refills | Status: DC
Start: 2022-03-18 — End: 2023-02-19

## 2022-03-18 MED ORDER — LEVOTHYROXINE SODIUM 75 MCG PO TABS
ORAL_TABLET | ORAL | 1 refills | Status: DC
Start: 2022-03-18 — End: 2023-02-19

## 2022-03-18 MED ORDER — BACLOFEN 10 MG PO TABS: ORAL_TABLET | ORAL | 0 refills | Status: DC

## 2022-03-18 MED ORDER — DICLOFENAC SODIUM 75 MG PO TBEC: DELAYED_RELEASE_TABLET | ORAL | 0 refills | Status: DC

## 2022-03-18 NOTE — Patient Instructions (Signed)
Please read carefully-this is the current policy.  All patients who are on chronic pain management will need to follow this policy.  As part of your visit today we have covered your chronic pain. You have been given prescription(s) for pain medicines.The DEA and [place] require that any patient on pain medications must be seen every 3 months. You will need to schedule an office visit today before further pain medications are issued. If you fail to come for your office visit we will not be sending in another month on the prescription pain meds.  You will always need to bring your pain medicine bottle to your office visit. We will be doing a random pill count.  Since we are managing your pain, do not get pain scripts from other doctors. We check the electronic database prescription registry regularly. If you are receiving pain medicines from another source we will STOP prescribing pain medicines.   We will not refill medications or early nor will we give an extended month supply at the end of these prescriptions.It is your responsibility to keep up with medications and attend your office visit. Lost or stolen pain medications will not be replaced.  It is your responsibility to schedule an office visit in 3 months to be seen before you are out of your medication. Do not call our office to request early refills or additional refills.  Office appointments fill up quickly so therefore we will schedule this office visit now for 3 months.  We believe that most patients take their meds as prescribed but drug misuse and diversion is a serious problem in the [place]. Our office does standard measures to insure proper care to all. All patients are subject to random urine drug screens/ saliva tests and random pill counts. Also all patients drug prescription records are reviewed on a regular basis in accordance with [place] medical board policies.  Remember, do not use alcohol or illegal drugs with your pain  medications. If you are feeling drowsy or affected by your medicine you are not to operate any machinery , do any dangerous activities or drive while this is occurring.   We are required by law to adhere to strict regulations. Failure on our part to follow these regulations could jeopardize our prescription license which in turn would cause [place] not to be able to care for you.Thank you for your understanding and following these policies.  Sharon

## 2022-03-18 NOTE — Progress Notes (Signed)
I spoke with IT you should be able to close the note now.

## 2022-03-18 NOTE — Progress Notes (Signed)
Subjective:    Patient ID: Jenna Contreras, female    DOB: 1945-06-09, 77 y.o.   MRN: [id number]  HPI This patient was seen today for chronic pain  The medication list was reviewed and updated.  Location of Pain for which the patient has been treated with regarding narcotics: Patient has severe mid back pain related to previous surgery failed improvement with surgery.  Patient has been on hydrocodone in the past and 5 mg oxycodone did not help enough recently we moved up to 10 mg oxycodone she is tolerating this well She does state that she uses muscle actions occasionally.  She gives a long history of using muscle relaxers because of spasms in her back that she states multiple doctors told her she needed to use these on a regular basis.  I have told her that I do not feel comfortable with her using them on a regular basis and occasionally uses okay but when she does she has to hold off on the oxycodone also if she is feeling drowsy not to drive  Onset of this pain:    -Compliance with medication: oxycodone (Percocet) 10-325 mg  - Number patient states they take daily: q 4 hrs  and then goes to bed (written for q 6 hrs)  -when was the last dose patient took? 8 this morning  The patient was advised the importance of maintaining medication and not using illegal substances with these.  Here for refills and follow up  The patient was educated that we can provide 3 monthly scripts for their medication, it is their responsibility to follow the instructions.  Side effects or complications from medications: none  Patient is aware that pain medications are meant to minimize the severity of the pain to allow their pain levels to improve to allow for better function. They are aware of that pain medications cannot totally remove their pain.  Due for UDT ( at least once per year) : completed in October 2023  Scale of 1 to 10 ( 1 is least 10 is most) Your pain level without the medicine:  10 Your pain level with medication: 8  Scale 1 to 10 ( 1-helps very little, 10 helps very well) How well does your pain medication reduce your pain so you can function better through out the day? 1  Quality of the pain: Throbbing aching pain  Persistence of the pain: Present all the time  Modifying factors: Worse with activity  Pt states that her anxiety is cause muscle contractions per specialist.   MRI Right Knee-believes she has bone cancer she is following with specialists        Review of Systems     Objective:   Physical Exam General-in no acute distress Eyes-no discharge Lungs-respiratory rate normal, CTA CV-no murmurs,RRR Extremities skin warm dry no edema Neuro grossly normal Behavior normal, alert Subjective discomfort in her back where she is having surgery       Assessment & Plan:  1. Chronic bilateral thoracic back pain Limited on range of motion To do massage warm compresses Muscle relaxer sparingly see discussion above  2. Encounter for pain management The patient was seen in followup for chronic pain. A review over at their current pain status was discussed. Drug registry was checked. Prescriptions were given.  Regular follow-up recommended. Discussion was held regarding the importance of compliance with medication as well as pain medication contract.  Patient was informed that medication may cause drowsiness and should not be combined  with other medications/alcohol or street drugs. If the patient feels medication is causing altered alertness then do not drive or operate dangerous equipment.  Should be noted that the patient appears to be meeting appropriate use of opioids and response.  Evidenced by improved function and decent pain control without significant side effects and no evidence of overt aberrancy issues.  Upon discussion with the patient today they understand that opioid therapy is optional and they feel that the pain has been refractory  to reasonable conservative measures and is significant and affecting quality of life enough to warrant ongoing therapy and wishes to continue opioids.  Refills were provided. 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

## 2022-03-19 ENCOUNTER — Telehealth: Payer: Self-pay | Admitting: Family Medicine

## 2022-03-19 NOTE — Telephone Encounter (Signed)
Pt in yesterday for pain management and inquired about referral placed back in November to Endo. Per Courtney-It looks they reached out to her on 12/26 and 1/9 and Patient has not called back to scheduled Appt - Per Referral Notes If she would like to return their call she may do so at [phone] :)   Pt contacted and verbalized understanding.

## 2022-04-27 ENCOUNTER — Telehealth: Payer: Self-pay | Admitting: Family Medicine

## 2022-04-27 DIAGNOSIS — D649 Anemia, unspecified: Secondary | ICD-10-CM

## 2022-04-27 DIAGNOSIS — Z79899 Other long term (current) drug therapy: Secondary | ICD-10-CM

## 2022-04-27 NOTE — Addendum Note (Signed)
Addended by: Vicente Males on: 04/27/2022 01:52 PM   Modules accepted: Orders

## 2022-04-27 NOTE — Telephone Encounter (Signed)
Lab orders placed and pt is aware. Hold message until results so they can be faxed.

## 2022-04-27 NOTE — Telephone Encounter (Signed)
May order CBC, metabolic 7 via [facility] today Diagnos Normochromic anemia, high risk medication

## 2022-04-27 NOTE — Telephone Encounter (Signed)
Pt called in and states she is needing a CBC and BMP ordered to [facility]. Pt is having major reconstruction surgery by Dr.Mary Paulita Cradle at [facility]. Pt has pre op appt on Thursday. Please advise. Thank you.  Fax [phone]

## 2022-04-28 LAB — CBC WITH DIFFERENTIAL/PLATELET
Basophils Absolute: 0.1 10*3/uL (ref 0.0–0.2)
Basos: 1 %
EOS (ABSOLUTE): 0.3 10*3/uL (ref 0.0–0.4)
Eos: 3 %
Hematocrit: 38.2 % (ref 34.0–46.6)
Hemoglobin: 12.8 g/dL (ref 11.1–15.9)
Immature Grans (Abs): 0 10*3/uL (ref 0.0–0.1)
Immature Granulocytes: 0 %
Lymphocytes Absolute: 3.4 10*3/uL — ABNORMAL HIGH (ref 0.7–3.1)
Lymphs: 41 %
MCH: 29.8 pg (ref 26.6–33.0)
MCHC: 33.5 g/dL (ref 31.5–35.7)
MCV: 89 fL (ref 79–97)
Monocytes Absolute: 0.5 10*3/uL (ref 0.1–0.9)
Monocytes: 6 %
Neutrophils Absolute: 4 10*3/uL (ref 1.4–7.0)
Neutrophils: 49 %
Platelets: 287 10*3/uL (ref 150–450)
RBC: 4.29 x10E6/uL (ref 3.77–5.28)
RDW: 13 % (ref 11.7–15.4)
WBC: 8.3 10*3/uL (ref 3.4–10.8)

## 2022-04-28 LAB — BASIC METABOLIC PANEL
BUN/Creatinine Ratio: 45 — ABNORMAL HIGH (ref 12–28)
BUN: 28 mg/dL — ABNORMAL HIGH (ref 8–27)
CO2: 22 mmol/L (ref 20–29)
Calcium: 9.4 mg/dL (ref 8.7–10.3)
Chloride: 103 mmol/L (ref 96–106)
Creatinine, Ser: 0.62 mg/dL (ref 0.57–1.00)
Glucose: 80 mg/dL (ref 70–99)
Potassium: 4.3 mmol/L (ref 3.5–5.2)
Sodium: 141 mmol/L (ref 134–144)
eGFR: 91 mL/min/{1.73_m2} (ref >59)

## 2022-04-28 NOTE — Telephone Encounter (Signed)
Lab results faxed as requested. Pt has been called and made aware that lab results have been faxed.

## 2022-05-08 ENCOUNTER — Telehealth: Payer: Self-pay | Admitting: Family Medicine

## 2022-05-08 NOTE — Telephone Encounter (Signed)
Prescription Request  05/08/2022   [name]: 03/18/2022  What is the name of the medication or equipment? baclofen (LIORESAL) 10 MG tablet   Have you contacted your pharmacy to request a refill? Yes   Which pharmacy would you like this sent to?  [facility], [place] [address] [place] [address] Phone: [phone] Fax: [facility], [place] [address] Phone: [phone] Fax: [phone]    Patient notified that their request is being sent to the clinical staff for review and that they should receive a response within 2 business days.   Please advise at Mobile [phone] (mobile)

## 2022-05-10 NOTE — Telephone Encounter (Signed)
Nurses In the past this patient has requested this medicine and her pharmacy for some reason wanted an incredible number of baclofen  In my opinion this medicine should be used sporadically at best and I have told the patient such I believe our last refill was for 28 tablets she may have 2 additional refill sent in Thank you

## 2022-05-11 ENCOUNTER — Encounter: Payer: Self-pay | Admitting: Primary Care

## 2022-05-11 ENCOUNTER — Ambulatory Visit (INDEPENDENT_AMBULATORY_CARE_PROVIDER_SITE_OTHER): Payer: Medicare Other | Admitting: Primary Care

## 2022-05-11 VITALS — BP 128/72 | HR 70 | Temp 98.0°F | Ht 60.0 in | Wt 123.4 lb

## 2022-05-11 DIAGNOSIS — J301 Allergic rhinitis due to pollen: Secondary | ICD-10-CM | POA: Diagnosis not present

## 2022-05-11 DIAGNOSIS — J309 Allergic rhinitis, unspecified: Secondary | ICD-10-CM | POA: Insufficient documentation

## 2022-05-11 DIAGNOSIS — J4520 Mild intermittent asthma, uncomplicated: Secondary | ICD-10-CM | POA: Diagnosis not present

## 2022-05-11 DIAGNOSIS — R911 Solitary pulmonary nodule: Secondary | ICD-10-CM

## 2022-05-11 DIAGNOSIS — Z0189 Encounter for other specified special examinations: Secondary | ICD-10-CM | POA: Diagnosis not present

## 2022-05-11 MED ORDER — BACLOFEN 10 MG PO TABS: ORAL_TABLET | ORAL | 2 refills | Status: DC
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
## 2022-05-11 NOTE — Assessment & Plan Note (Addendum)
-   Patient has post nasal drip symptoms in the Spring. Using Flonase nasal spray as needed. Advised she start OTC Claritin or Zyretc

## 2022-05-11 NOTE — Assessment & Plan Note (Signed)
-   HRCT in November 2012 showed no evidence of interstitial lung disease. Scattered small solid pulmonary nodules in both lungs are all stable and considered benign, largest 33m RUL. No further imaging warranted at this time.

## 2022-05-11 NOTE — Telephone Encounter (Signed)
Left message to inform per drs notes.

## 2022-05-11 NOTE — Assessment & Plan Note (Addendum)
-   Planning for breast reconstruction on March 11th, 2024 with [name].  This will be done in outpatient setting under general anesthesia.  Patient is considered low risk for prolonged mechanical ventilation and/or postop pulmonary complications.  She is optimized for surgery from a pulmonary standpoint.  Ultimate clearance will be decided upon by surgeon and anesthesiologist.  Her respiratory exam today was benign.  Asthma is well-controlled.  No recent respiratory infections.  Encourage early ambulation postop, compression stockings and incentive spirometer hourly while awake.

## 2022-05-11 NOTE — Patient Instructions (Addendum)
Good to see you Jenna Contreras, you looked well today You will be considered low risk for prolonged mechanical ventilation and/ or postop pulmonary complications  Recommendations  - Early ambulation postop - Use incentive spirometer hourly while awake for about 5 to 7 days after surgery to encourage deep breathing - Wear compression stockings to prevent blood clot in your legs - Use Albuterol 2 puffs every 4-6 hours for shortness of breath/wheezing or cough. If needing to use daily notify office  - Take daily antihistamine such as Claritin or Zyrtec and Flonase as needed for allergy symptoms/post nasal drip - If you develop chest tightness, wheezing or worsening asthma symptoms call or notify office   Follow-up 1 year with [name] or sooner if needed

## 2022-05-11 NOTE — Assessment & Plan Note (Signed)
-   Stable; No acute respiratory symptoms or recent exacerbations. Denies cough or shortness of breath symptoms. ACT score 24.  No longer on Flovent. Using Albuterol once a month or less.

## 2022-05-28 ENCOUNTER — Encounter (HOSPITAL_COMMUNITY): Payer: Self-pay

## 2022-05-28 ENCOUNTER — Telehealth: Payer: Self-pay

## 2022-05-28 ENCOUNTER — Emergency Department (HOSPITAL_COMMUNITY)
Admission: EM | Admit: 2022-05-28 | Discharge: 2022-05-28 | Disposition: A | Discharge status: Home / Self Care | Payer: Medicare Other | Attending: Emergency Medicine | Admitting: Emergency Medicine

## 2022-05-28 ENCOUNTER — Other Ambulatory Visit: Payer: Self-pay

## 2022-05-28 DIAGNOSIS — R21 Rash and other nonspecific skin eruption: Secondary | ICD-10-CM | POA: Diagnosis not present

## 2022-05-28 DIAGNOSIS — Z79890 Hormone replacement therapy (postmenopausal): Secondary | ICD-10-CM | POA: Insufficient documentation

## 2022-05-28 DIAGNOSIS — Z91040 Latex allergy status: Secondary | ICD-10-CM | POA: Insufficient documentation

## 2022-05-28 DIAGNOSIS — E039 Hypothyroidism, unspecified: Secondary | ICD-10-CM | POA: Diagnosis not present

## 2022-05-28 MED ORDER — HYDROXYZINE HCL 25 MG PO TABS
25.0000 mg | ORAL_TABLET | Freq: Once | ORAL | Status: AC
  Administered 2022-05-28: 25 mg via ORAL
  Filled 2022-05-28: qty 1

## 2022-05-28 MED ORDER — HYDROXYZINE HCL 25 MG PO TABS: 25.0000 mg | ORAL_TABLET | ORAL | 0 refills | Status: DC | PRN

## 2022-05-28 MED ORDER — DEXAMETHASONE 4 MG PO TABS
4.0000 mg | ORAL_TABLET | Freq: Once | ORAL | Status: AC
  Administered 2022-05-28: 4 mg via ORAL
  Filled 2022-05-28: qty 1

## 2022-05-28 MED ORDER — DEXAMETHASONE 4 MG PO TABS: 4.0000 mg | ORAL_TABLET | Freq: Every day | ORAL | 0 refills | Status: DC

## 2022-05-28 MED ORDER — FAMOTIDINE 20 MG PO TABS
20.0000 mg | ORAL_TABLET | Freq: Once | ORAL | Status: AC
  Administered 2022-05-28: 20 mg via ORAL
  Filled 2022-05-28: qty 1

## 2022-05-28 MED ORDER — DEXAMETHASONE 4 MG PO TABS: 4.0000 mg | ORAL_TABLET | Freq: Every day | ORAL | 0 refills | Status: AC

## 2022-05-28 NOTE — Telephone Encounter (Signed)
agree

## 2022-05-28 NOTE — ED Provider Notes (Signed)
[facility] Provider Note   CSN: [id number] Arrival date & time: 05/28/22  1452     History  Chief Complaint  Patient presents with   Rash    Jenna Contreras is a 77 y.o. female.   Rash   77 year old female presents emergency department complaints of rash.  Patient noticed "hives" whenever she feels increased anxiety/stress.  Patient states that her fianc "is dying" and has had increased life stress over the past several weeks.  Has noted similar incidence of rash breaking out on face, neck, upper extremities but symptoms seem to resolve with Benadryl and time at home.  Patient describes the rash as itchy in nature.  States that this rash has been present for the past few days.  Denies fever, chills, difficulty breathing/swallowing, feelings of throat closing on her, chest pain, shortness of breath, abdominal pain, nausea, vomiting.  Has tried at home Benadryl with some relief of symptoms.  Presents emergency department for further evaluation.  Denies any new medication, fragrances, rash, detergents, soaps, lotions.    Past medical history significant for anxiety, hypothyroidism, OCD, gout, fibromyalgia, bipolar 2 disorder  Home Medications Prior to Admission medications   Medication Sig Start Date End Date Taking? Authorizing Provider  dexamethasone (DECADRON) 4 MG tablet Take 1 tablet (4 mg total) by mouth daily. 05/29/22  Yes Wilnette Kales, PA  hydrOXYzine (ATARAX) 25 MG tablet Take 1 tablet (25 mg total) by mouth every 4 (four) hours as needed for anxiety or itching. 05/28/22  Yes Wilnette Kales, PA  albuterol [facility] HFA) 108 (90 Base) MCG/ACT inhaler USE 2 INHALATIONS EVERY 4 HOURS AS NEEDED 07/18/20   Martyn Ehrich, NP  albuterol (PROVENTIL) (2.5 MG/3ML) 0.083% nebulizer solution Take 3 mLs (2.5 mg total) by nebulization every 6 (six) hours as needed for wheezing or shortness of breath. 05/14/20   Martyn Ehrich, NP   baclofen (LIORESAL) 10 MG tablet One bid prn caution drowsiness 05/11/22   Kathyrn Drown, MD  chlorpheniramine (CHLOR-TRIMETON) 4 MG tablet Take 1 tablet (4 mg total) by mouth every 4 (four) hours as needed for allergies (Cough). 03/27/21   Martyn Ehrich, NP  Cholecalciferol (VITAMIN D) 125 MCG (5000 UT) CAPS Take by mouth.    [provider]  cyanocobalamin (VITAMIN B12) 1000 MCG tablet Take 1,000 mcg by mouth daily.    [provider]  diclofenac (VOLTAREN) 75 MG EC tablet Take 1 tablet (75 mg total) by mouth 2 (two) times daily. 03/18/22   Kathyrn Drown, MD  diclofenac (VOLTAREN) 75 MG EC tablet Take one tablet po BID 03/18/22   Luking, Elayne Snare, MD  fluticasone (FLONASE) 50 MCG/ACT nasal spray Place 2 sprays into both nostrils daily. 03/12/21   Martyn Ehrich, NP  hydrOXYzine (VISTARIL) 25 MG capsule Take 1-2 capsules (25-50 mg total) by mouth 3 (three) times daily as needed for anxiety. 10/29/20   Kirsteins, Luanna Salk, MD  levothyroxine (SYNTHROID) 75 MCG tablet Take one tablet po daily 03/18/22   Kathyrn Drown, MD  lidocaine (XYLOCAINE) 1 % (with preservative) injection by Infiltration route. 04/29/21   [provider]  magnesium oxide (MAG-OX) 400 (240 Mg) MG tablet Take 400 mg by mouth daily.    [provider]  NONFORMULARY OR COMPOUNDED ITEM Apply topically. 12/10/20   [provider]  ondansetron (ZOFRAN) 4 MG tablet Take 1 tablet (4 mg total) by mouth every 6 (six) hours. 09/02/17  Kathyrn Drown, MD  OVER THE COUNTER MEDICATION [place] and dulcolax    [provider]  oxyCODONE-acetaminophen (PERCOCET) 10-325 MG tablet 1 q6 prn pain 03/18/22   Kathyrn Drown, MD  oxyCODONE-acetaminophen (PERCOCET) 10-325 MG tablet One qid prn pain Patient not taking: Reported on 05/11/2022 03/18/22   Kathyrn Drown, MD  oxyCODONE-acetaminophen (PERCOCET) 10-325 MG tablet One qid prn pain Patient not taking: Reported on 05/11/2022 03/18/22   Kathyrn Drown, MD  Polyethyl Glycol-Propyl Glycol (SYSTANE ULTRA OP) Place 1 drop into both eyes at bedtime.    [provider]  rizatriptan (MAXALT) 10 MG tablet TAKE AS DIRECTED, MAY REPEAT IN 2 HOURS IF NEEDED 12/24/21   Kathyrn Drown, MD  rosuvastatin (CRESTOR) 10 MG tablet Take 1 tablet (10 mg total) by mouth daily. Patient not taking: Reported on 06/03/2021 05/05/21 11/01/21  Kathyrn Drown, MD  triamcinolone acetonide (TRIESENCE) 40 MG/ML SUSP Inject into the articular space. 04/29/21   [provider]  trolamine salicylate (ASPERCREME) 10 % cream Apply 1 application topically as needed for muscle pain.    [provider]      Allergies    Betadine [povidone-iodine], Prednisone, Silvadene [silver sulfadiazine], Sulfa antibiotics, Latex, Wound dressing adhesive, Adhesive [tape], Betadine [povidone iodine], Lamictal [lamotrigine], Levaquin [levofloxacin in d5w], Tizanidine, Trazodone and nefazodone, Zyrtec [cetirizine], Baclofen, Benadryl [diphenhydramine], Cephalexin, Claritin [loratadine], Demerol [meperidine], Doxycycline, Erythromycin, Keflex [cephalexin], Other, and Tetracyclines & related    Review of Systems   Review of Systems  Skin:  Positive for rash.  All other systems reviewed and are negative.   Physical Exam Updated Vital Signs BP 108/60 (BP Location: Right Arm)   Pulse 74   Temp 97.9 F (36.6 C) (Temporal)   Resp 16   Ht 5\' 1"  (1.549 m)   Wt 53.1 kg   SpO2 98%   BMI 22.11 kg/m  Physical Exam Vitals and nursing note reviewed.  Constitutional:      General: She is not in acute distress.    Appearance: She is well-developed.  HENT:     Head: Normocephalic and atraumatic.     Mouth/Throat:     Mouth: Mucous membranes are moist.     Pharynx: Oropharynx is clear. No oropharyngeal exudate or posterior oropharyngeal erythema.     Comments: No appreciable stridor.  Uvula midline and rises symmetrically with phonation. Eyes:      Conjunctiva/sclera: Conjunctivae normal.  Cardiovascular:     Rate and Rhythm: Normal rate and regular rhythm.     Heart sounds: No murmur heard. Pulmonary:     Effort: Pulmonary effort is normal. No respiratory distress.     Breath sounds: Normal breath sounds.  Abdominal:     Palpations: Abdomen is soft.     Tenderness: There is no abdominal tenderness.  Musculoskeletal:        General: No swelling.     Cervical back: Neck supple.  Skin:    General: Skin is warm and dry.     Capillary Refill: Capillary refill takes less than 2 seconds.     Comments: Raised wheal-like rash noted on face left anterior neck, left anterior chest, right and left hand.  Excoriations appreciated from patient scratching site areas.  No obvious extending erythema, palpable warmth/induration/fluctuance.  Neurological:     Mental Status: She is alert.  Psychiatric:        Mood and Affect: Mood normal.     ED Results / Procedures / Treatments   Labs (  all labs ordered are listed, but only abnormal results are displayed) Labs Reviewed - No data to display  EKG None  Radiology No results found.  Procedures Procedures    Medications Ordered in ED Medications  hydrOXYzine (ATARAX) tablet 25 mg (has no administration in time range)  famotidine (PEPCID) tablet 20 mg (has no administration in time range)  dexamethasone (DECADRON) tablet 4 mg (has no administration in time range)    ED Course/ Medical Decision Making/ A&P                             Medical Decision Making Risk Prescription drug management.   This patient presents to the ED for concern of rash, this involves an extensive number of treatment options, and is a complaint that carries with it a high risk of complications and morbidity.  The differential diagnosis includes SJS/TEN, angioedema, contact dermatitis, cellulitis, erysipelas, necrotizing fasciitis   Co morbidities that complicate the patient evaluation  See  HPI   Additional history obtained:  Additional history obtained from EMR External records from outside source obtained and reviewed including hospital records   Lab Tests:  N/a   Imaging Studies ordered:  N/a   Cardiac Monitoring: / EKG:  The patient was maintained on a cardiac monitor.  I personally viewed and interpreted the cardiac monitored which showed an underlying rhythm of: Sinus rhythm   Consultations Obtained:  N/a   Problem List / ED Course / Critical interventions / Medication management  Rash I ordered medication including hydroxyzine, dexamethasone   Reevaluation of the patient after these medicines showed that the patient improved I have reviewed the patients home medicines and have made adjustments as needed   Social Determinants of Health:  Former cigarette use.  Denies illicit drug use.   Test / Admission - Considered:  Rash Vitals signs within normal range and stable throughout visit. Patient presents with evidence of rash.  Rash most consistent with urticaria.  No evidence of angioedema.  Rash is been present for the past 3 days are rather stable in nature.  No evidence of airway compromise.  Will treat with dexamethasone, antihistamine outpatient.  Recommend follow-up with primary care for reassessment of symptoms.  Patient overall well-appearing, afebrile in no acute distress.  Treatment plan discussed at length with patient and she acknowledged understanding was agreeable to said plan. Worrisome signs and symptoms were discussed with the patient, and the patient acknowledged understanding to return to the ED if noticed. Patient was stable upon discharge.          Final Clinical Impression(s) / ED Diagnoses Final diagnoses:  Rash    Rx / DC Orders ED Discharge Orders          Ordered    dexamethasone (DECADRON) 4 MG tablet  Daily        05/28/22 1605    hydrOXYzine (ATARAX) 25 MG tablet  Every 4 hours PRN        05/28/22 1605               Wilnette Kales, [place] 05/28/22 1636    Hayden Rasmussen, MD 05/29/22 [phone]

## 2022-05-28 NOTE — Discharge Instructions (Addendum)
Note the workup today was overall reassuring.  As discussed, we will send in steroids to take for the next few days.  Recommend taking Atarax as needed for feelings of anxiety.  This medication is in the same family as Benadryl and Zyrtec so please do not take these medicines together.  Wait appropriate time in between administration of medicines as bottles direct.  Recommend reevaluation by primary care for reassessment of your symptoms.  Please do not hesitate to return to emergency department if the worrisome signs and symptoms we discussed become apparent.

## 2022-05-28 NOTE — Telephone Encounter (Signed)
Patient called and stated she had hives and swelling. Patient denied SOB. Patient was informed that Dr Nicki Reaper did not have any appointments and after consulting with RN patient was informed to go to ED. Patient verbalized understanding.

## 2022-05-28 NOTE — ED Triage Notes (Signed)
Pt reports her fiance is dying and recently whenever the doctor gives them info about her fiance she breaks out in hives and her PCP told her to come to the ER.

## 2022-06-08 DIAGNOSIS — Z96653 Presence of artificial knee joint, bilateral: Secondary | ICD-10-CM | POA: Diagnosis not present

## 2022-06-08 DIAGNOSIS — Z96651 Presence of right artificial knee joint: Secondary | ICD-10-CM | POA: Diagnosis not present

## 2022-06-08 DIAGNOSIS — M7051 Other bursitis of knee, right knee: Secondary | ICD-10-CM | POA: Diagnosis not present

## 2022-06-17 ENCOUNTER — Telehealth (INDEPENDENT_AMBULATORY_CARE_PROVIDER_SITE_OTHER): Payer: Medicare Other | Admitting: Family Medicine

## 2022-06-17 DIAGNOSIS — M546 Pain in thoracic spine: Secondary | ICD-10-CM

## 2022-06-17 DIAGNOSIS — E785 Hyperlipidemia, unspecified: Secondary | ICD-10-CM

## 2022-06-17 DIAGNOSIS — E038 Other specified hypothyroidism: Secondary | ICD-10-CM

## 2022-06-17 DIAGNOSIS — G8929 Other chronic pain: Secondary | ICD-10-CM

## 2022-06-17 NOTE — Progress Notes (Signed)
   Subjective:    Patient ID: Jenna Contreras, female    DOB: 03/08/1946, 77 y.o.   MRN: [id number]  HPI Virtual Visit via Video Note  I connected with Jenna Contreras on 06/17/22 at 10:20 AM EDT by a video enabled telemedicine application and verified that I am speaking with the correct person using two identifiers.  Location: Patient: Home Provider: Office   I discussed the limitations of evaluation and management by telemedicine and the availability of in person appointments. The patient expressed understanding and agreed to proceed.  History of Present Illness:    Observations/Objective:   Assessment and Plan:   Follow Up Instructions:    I discussed the assessment and treatment plan with the patient. The patient was provided an opportunity to ask questions and all were answered. The patient agreed with the plan and demonstrated an understanding of the instructions.   The patient was advised to call back or seek an in-person evaluation if the symptoms worsen or if the condition fails to improve as anticipated.  I provided 15 minutes of non-face-to-face time during this encounter.   Jenna Punt, MD  Patient is to discuss 3 month follow up. Patient states her asthma and allergies are doing good.  She relates she is hanging in there Recently she has not been using pain medicine She does have chronic back pain Primarily utilizing muscle relaxers and muscle rubs    Review of Systems     Objective:   Physical Exam  Patient had virtual visit-video Appears to be in no distress Atraumatic Neuro able to relate and oriented No apparent resp distress Color normal       Assessment & Plan:   1. Mild hyperlipidemia Patient not taking statins because she relates pain and discomfort not feeling good  2. Other specified hypothyroidism Takes her thyroid medicine regular basis does well energy doing okay  3. Chronic bilateral thoracic back  pain Chronic back pain using muscle relaxers and other measures to help but she states she might have to restart pain medicine referral right now she is not I told her for her leftover oxycodone if she restarts to only do half dose at a time  Follow-up by summertime

## 2022-06-17 NOTE — Addendum Note (Signed)
Addended by: Lilyan Punt A on: 06/17/2022 07:02 PM   Modules accepted: Orders

## 2022-07-03 ENCOUNTER — Telehealth: Payer: Self-pay | Admitting: Family Medicine

## 2022-07-03 MED ORDER — HYDROXYZINE HCL 25 MG PO TABS: 25.0000 mg | ORAL_TABLET | ORAL | 0 refills | Status: DC | PRN

## 2022-07-03 NOTE — Telephone Encounter (Signed)
May have refill on the hydroxyzine

## 2022-07-03 NOTE — Telephone Encounter (Signed)
Script sent to pharmacy.

## 2022-07-03 NOTE — Telephone Encounter (Signed)
Patient has broken out in hives again . She states its due to her anxiety issues.. She was sent to ER on 3/21 and they gave her hydroxyzine 25 mg for it. She is requesting a refill on it.please advise

## 2022-07-17 ENCOUNTER — Other Ambulatory Visit: Payer: Self-pay | Admitting: Family Medicine

## 2022-07-17 ENCOUNTER — Telehealth: Payer: Self-pay

## 2022-07-17 MED ORDER — LORAZEPAM 0.5 MG PO TABS: 0.5000 mg | ORAL_TABLET | Freq: Two times a day (BID) | ORAL | 0 refills | Status: DC | PRN

## 2022-07-17 NOTE — Telephone Encounter (Signed)
Pt fiance passed away and the funeral is tomorrow and she will be speaking she is having a very hard time dealing with this she is panic attack, hard to function she is having diarrhea. She said she is not taking her pain med or muscle relaxer she is using the tens unit. She is wanting to see if she can get put back on Lorazepam   Jenna Contreras [phone]  Pt uses [facility]

## 2022-07-17 NOTE — Telephone Encounter (Signed)
Cook, Jayce G, DO     Rx sent.   

## 2022-07-17 NOTE — Telephone Encounter (Signed)
Patient notified and stated she has picked up the prescription.

## 2022-07-30 ENCOUNTER — Telehealth: Payer: Self-pay | Admitting: Family Medicine

## 2022-07-30 NOTE — Telephone Encounter (Signed)
It would be nice to have a copy of this letter, if possible for the patient to bring this by If not at least we need to know which lab was refused so we can have a letter that is most specific thank you

## 2022-07-30 NOTE — Telephone Encounter (Signed)
Patient states received a bill from [employer] insurance denying payment for recent labs you order on patient if you send a letter stating she needing those certain labs done for  both insurance will cover labs from February.

## 2022-08-27 ENCOUNTER — Other Ambulatory Visit: Payer: Self-pay | Admitting: Family Medicine

## 2022-10-13 DIAGNOSIS — H00021 Hordeolum internum right upper eyelid: Secondary | ICD-10-CM | POA: Diagnosis not present

## 2022-10-14 DIAGNOSIS — H00021 Hordeolum internum right upper eyelid: Secondary | ICD-10-CM | POA: Diagnosis not present

## 2022-10-16 ENCOUNTER — Telehealth: Payer: Self-pay | Admitting: Family Medicine

## 2022-10-16 NOTE — Telephone Encounter (Signed)
Refill on hydrOXYzine (ATARAX) 25 MG tablet  for hives breakout . She is requesting 30 tablets with refill. [facility] in [place], please advise

## 2022-10-19 MED ORDER — HYDROXYZINE PAMOATE 25 MG PO CAPS: 25.0000 mg | ORAL_CAPSULE | Freq: Four times a day (QID) | ORAL | 1 refills | Status: DC | PRN

## 2022-10-19 NOTE — Telephone Encounter (Signed)
May have prescription for hydroxyzine 25 mg 1 taken every 6 hours as needed itching or hives caution drowsiness #30 with 2 refill

## 2022-10-19 NOTE — Telephone Encounter (Signed)
See MyChart message

## 2022-10-20 ENCOUNTER — Other Ambulatory Visit: Payer: Self-pay | Admitting: Family Medicine

## 2022-10-27 ENCOUNTER — Telehealth: Payer: Self-pay | Admitting: Family Medicine

## 2022-10-27 DIAGNOSIS — Z79899 Other long term (current) drug therapy: Secondary | ICD-10-CM

## 2022-10-27 NOTE — Telephone Encounter (Signed)
[facility] in [place] called about patient havingBasci metabolic panel done as soon as possible and she will needs results faxed to her for surgical clearance in two week. [phone] surgical nurse  [phone]

## 2022-10-27 NOTE — Telephone Encounter (Signed)
May order metabolic 7 due to high risk medication If they need stat I would recommend through the hospital ordered stat If 24-hour turnaround is fine I would recommend having it drawn through [facility] with 24-hour turnaround  If patient having any significant surgery that needs surgical clearance also recommend office visit

## 2022-10-29 ENCOUNTER — Encounter: Payer: Self-pay | Admitting: *Deleted

## 2022-10-29 NOTE — Telephone Encounter (Signed)
Blood work ordered in EPIC. Patient notified via my chart 

## 2022-11-05 DIAGNOSIS — M7918 Myalgia, other site: Secondary | ICD-10-CM | POA: Diagnosis not present

## 2022-11-05 DIAGNOSIS — Z981 Arthrodesis status: Secondary | ICD-10-CM | POA: Diagnosis not present

## 2022-11-05 DIAGNOSIS — M542 Cervicalgia: Secondary | ICD-10-CM | POA: Diagnosis not present

## 2022-11-05 DIAGNOSIS — G8929 Other chronic pain: Secondary | ICD-10-CM | POA: Diagnosis not present

## 2022-11-06 ENCOUNTER — Ambulatory Visit (INDEPENDENT_AMBULATORY_CARE_PROVIDER_SITE_OTHER): Payer: Medicare Other

## 2022-11-06 VITALS — Ht 61.0 in | Wt 113.0 lb

## 2022-11-06 DIAGNOSIS — Z0000 Encounter for general adult medical examination without abnormal findings: Secondary | ICD-10-CM

## 2022-11-06 DIAGNOSIS — Z1231 Encounter for screening mammogram for malignant neoplasm of breast: Secondary | ICD-10-CM

## 2022-11-06 DIAGNOSIS — Z1159 Encounter for screening for other viral diseases: Secondary | ICD-10-CM

## 2022-11-06 DIAGNOSIS — Z780 Asymptomatic menopausal state: Secondary | ICD-10-CM

## 2022-11-06 NOTE — Patient Instructions (Signed)
Jenna Contreras , Thank you for taking time to come for your Medicare Wellness Visit. I appreciate your ongoing commitment to your health goals. Please review the following plan we discussed and let me know if I can assist you in the future.   Referrals/Orders/Follow-Ups/Clinician Recommendations:  You have an order for:  []   2D Mammogram  [x]   3D Mammogram  []   Bone Density   [x]   Lung Cancer Screening  Please call for appointment:   [facility] [address] [place], [place] [address] [phone]  Make sure to wear two-piece clothing.  No lotions powders or deodorants the day of the appointment Make sure to bring picture ID and insurance card.  Bring list of medications you are currently taking including any supplements.   Schedule your [place] screening mammogram through MyChart!   Log into your MyChart account.  Go to 'Visit' (or 'Appointments' if on mobile App) --> Schedule an Appointment  Under 'Select a Reason for Visit' choose the Mammogram Screening option.  Complete the pre-visit questions and select the time and place that best fits your schedule.    This is a list of the screening recommended for you and due dates:  Health Maintenance  Topic Date Due   Hepatitis C Screening  Never done   Zoster (Shingles) Vaccine (1 of 2) 02/26/1965   COVID-19 Vaccine (2 - Moderna risk series) 11/12/2019   Mammogram  03/29/2021   DEXA scan (bone density measurement)  06/26/2022   Flu Shot  10/08/2022   Medicare Annual Wellness Visit  11/06/2023   DTaP/Tdap/Td vaccine (3 - Td or Tdap) 09/22/2029   Pneumonia Vaccine  Completed   HPV Vaccine  Aged Out   Colon Cancer Screening  Discontinued    Advanced directives: (Declined) Advance directive discussed with you today. Even though you declined this today, please call our office should you change your mind, and we can give you the proper paperwork for you to fill out.  Next  Medicare Annual Wellness Visit scheduled for next year: Yes Preventive Care 79 Years and Older, Female Preventive care refers to lifestyle choices and visits with your health care provider that can promote health and wellness. Preventive care visits are also called wellness exams. What can I expect for my preventive care visit? Counseling Your health care provider may ask you questions about your: Medical history, including: Past medical problems. Family medical history. Pregnancy and menstrual history. History of falls. Current health, including: Memory and ability to understand (cognition). Emotional well-being. Home life and relationship well-being. Sexual activity and sexual health. Lifestyle, including: Alcohol, nicotine or tobacco, and drug use. Access to firearms. Diet, exercise, and sleep habits. Work and work Astronomer. Sunscreen use. Safety issues such as seatbelt and bike helmet use. Physical exam Your health care provider will check your: Height and weight. These may be used to calculate your BMI (body mass index). BMI is a measurement that tells if you are at a healthy weight. Waist circumference. This measures the distance around your waistline. This measurement also tells if you are at a healthy weight and may help predict your risk of certain diseases, such as type 2 diabetes and high blood pressure. Heart rate and blood pressure. Body temperature. Skin for abnormal spots. What immunizations do I need?  Vaccines are usually given at various ages, according to a schedule. Your health care provider will recommend vaccines for you based on your age, medical history, and lifestyle or other  factors, such as travel or where you work. What tests do I need? Screening Your health care provider may recommend screening tests for certain conditions. This may include: Lipid and cholesterol levels. Hepatitis C test. Hepatitis B test. HIV (human immunodeficiency virus)  test. STI (sexually transmitted infection) testing, if you are at risk. Lung cancer screening. Colorectal cancer screening. Diabetes screening. This is done by checking your blood sugar (glucose) after you have not eaten for a while (fasting). Mammogram. Talk with your health care provider about how often you should have regular mammograms. BRCA-related cancer screening. This may be done if you have a family history of breast, ovarian, tubal, or peritoneal cancers. Bone density scan. This is done to screen for osteoporosis. Talk with your health care provider about your test results, treatment options, and if necessary, the need for more tests. Follow these instructions at home: Eating and drinking  Eat a diet that includes fresh fruits and vegetables, whole grains, lean protein, and low-fat dairy products. Limit your intake of foods with high amounts of sugar, saturated fats, and salt. Take vitamin and mineral supplements as recommended by your health care provider. Do not drink alcohol if your health care provider tells you not to drink. If you drink alcohol: Limit how much you have to 0-1 drink a day. Know how much alcohol is in your drink. In the [place] one drink equals one 12 oz bottle of beer (355 mL), one 5 oz glass of wine (148 mL), or one 1 oz glass of hard liquor (44 mL). Lifestyle Brush your teeth every morning and night with fluoride toothpaste. Floss one time each day. Exercise for at least 30 minutes 5 or more days each week. Do not use any products that contain nicotine or tobacco. These products include cigarettes, chewing tobacco, and vaping devices, such as e-cigarettes. If you need help quitting, ask your health care provider. Do not use drugs. If you are sexually active, practice safe sex. Use a condom or other form of protection in order to prevent STIs. Take aspirin only as told by your health care provider. Make sure that you understand how much to take and what form to  take. Work with your health care provider to find out whether it is safe and beneficial for you to take aspirin daily. Ask your health care provider if you need to take a cholesterol-lowering medicine (statin). Find healthy ways to manage stress, such as: Meditation, yoga, or listening to music. Journaling. Talking to a trusted person. Spending time with friends and family. Minimize exposure to UV radiation to reduce your risk of skin cancer. Safety Always wear your seat belt while driving or riding in a vehicle. Do not drive: If you have been drinking alcohol. Do not ride with someone who has been drinking. When you are tired or distracted. While texting. If you have been using any mind-altering substances or drugs. Wear a helmet and other protective equipment during sports activities. If you have firearms in your house, make sure you follow all gun safety procedures. What's next? Visit your health care provider once a year for an annual wellness visit. Ask your health care provider how often you should have your eyes and teeth checked. Stay up to date on all vaccines. This information is not intended to replace advice given to you by your health care provider. Make sure you discuss any questions you have with your health care provider. Document Revised: 08/21/2020 Document Reviewed: 08/21/2020 Elsevier Patient Education  2024 Elsevier  Inc. Understanding Your Risk for Falls Millions of people have serious injuries from falls each year. It is important to understand your risk of falling. Talk with your health care provider about your risk and what you can do to lower it. If you do have a serious fall, make sure to tell your provider. Falling once raises your risk of falling again. How can falls affect me? Serious injuries from falls are common. These include: Broken bones, such as hip fractures. Head injuries, such as traumatic brain injuries (TBI) or concussions. A fear of falling  can cause you to avoid activities and stay at home. This can make your muscles weaker and raise your risk for a fall. What can increase my risk? There are a number of risk factors that increase your risk for falling. The more risk factors you have, the higher your risk of falling. Serious injuries from a fall happen most often to people who are older than 79 years old. Teenagers and young adults ages 34-29 are also at higher risk. Common risk factors include: Weakness in the lower body. Being generally weak or confused due to long-term (chronic) illness. Dizziness or balance problems. Poor vision. Medicines that cause dizziness or drowsiness. These may include: Medicines for your blood pressure, heart, anxiety, insomnia, or swelling (edema). Pain medicines. Muscle relaxants. Other risk factors include: Drinking alcohol. Having had a fall in the past. Having foot pain or wearing improper footwear. Working at a dangerous job. Having any of the following in your home: Tripping hazards, such as floor clutter or loose rugs. Poor lighting. Pets. Having dementia or memory loss. What actions can I take to lower my risk of falling?     Physical activity Stay physically fit. Do strength and balance exercises. Consider taking a regular class to build strength and balance. Yoga and tai chi are good options. Vision Have your eyes checked every year and your prescription for glasses or contacts updated as needed. Shoes and walking aids Wear non-skid shoes. Wear shoes that have rubber soles and low heels. Do not wear high heels. Do not walk around the house in socks or slippers. Use a cane or walker as told by your provider. Home safety Attach secure railings on both sides of your stairs. Install grab bars for your bathtub, shower, and toilet. Use a non-skid mat in your bathtub or shower. Attach bath mats securely with double-sided, non-slip rug tape. Use good lighting in all rooms. Keep a  flashlight near your bed. Make sure there is a clear path from your bed to the bathroom. Use night-lights. Do not use throw rugs. Make sure all carpeting is taped or tacked down securely. Remove all clutter from walkways and stairways, including extension cords. Repair uneven or broken steps and floors. Avoid walking on icy or slippery surfaces. Walk on the grass instead of on icy or slick sidewalks. Use ice melter to get rid of ice on walkways in the winter. Use a cordless phone. Questions to ask your health care provider Can you help me check my risk for a fall? Do any of my medicines make me more likely to fall? Should I take a vitamin D supplement? What exercises can I do to improve my strength and balance? Should I make an appointment to have my vision checked? Do I need a bone density test to check for weak bones (osteoporosis)? Would it help to use a cane or a walker? Where to find more information Centers for Disease Control and Prevention,  [employer]: TonerPromos.no Community-Based Fall Prevention Programs: TonerPromos.no [employer] on Aging: BaseRingTones.pl Contact a health care provider if: You fall at home. You are afraid of falling at home. You feel weak, drowsy, or dizzy. This information is not intended to replace advice given to you by your health care provider. Make sure you discuss any questions you have with your health care provider. Document Revised: 10/27/2021 Document Reviewed: 10/27/2021 Elsevier Patient Education  2024 Elsevier Inc. Managing Pain Without Opioids Opioids are strong medicines used to treat moderate to severe pain. For some people, especially those who have long-term (chronic) pain, opioids may not be the best choice for pain management due to: Side effects like nausea, constipation, and sleepiness. The risk of addiction (opioid use disorder). The longer you take opioids, the greater your risk of addiction. Pain that lasts for more than 3 months is called  chronic pain. Managing chronic pain usually requires more than one approach and is often provided by a team of health care providers working together (multidisciplinary approach). Pain management may be done at a pain management center or pain clinic. How to manage pain without the use of opioids Use non-opioid medicines Non-opioid medicines for pain may include: Over-the-counter or prescription non-steroidal anti-inflammatory drugs (NSAIDs). These may be the first medicines used for pain. They work well for muscle and bone pain, and they reduce swelling. Acetaminophen. This over-the-counter medicine may work well for milder pain but not swelling. Antidepressants. These may be used to treat chronic pain. A certain type of antidepressant (tricyclics) is often used. These medicines are given in lower doses for pain than when used for depression. Anticonvulsants. These are usually used to treat seizures but may also reduce nerve (neuropathic) pain. Muscle relaxants. These relieve pain caused by sudden muscle tightening (spasms). You may also use a pain medicine that is applied to the skin as a patch, cream, or gel (topical analgesic), such as a numbing medicine. These may cause fewer side effects than medicines taken by mouth. Do certain therapies as directed Some therapies can help with pain management. They include: Physical therapy. You will do exercises to gain strength and flexibility. A physical therapist may teach you exercises to move and stretch parts of your body that are weak, stiff, or painful. You can learn these exercises at physical therapy visits and practice them at home. Physical therapy may also involve: Massage. Heat wraps or applying heat or cold to affected areas. Electrical signals that interrupt pain signals (transcutaneous electrical nerve stimulation, TENS). Weak lasers that reduce pain and swelling (low-level laser therapy). Signals from your body that help you learn to  regulate pain (biofeedback). Occupational therapy. This helps you to learn ways to function at home and work with less pain. Recreational therapy. This involves trying new activities or hobbies, such as a physical activity or drawing. Mental health therapy, including: Cognitive behavioral therapy (CBT). This helps you learn coping skills for dealing with pain. Acceptance and commitment therapy (ACT) to change the way you think and react to pain. Relaxation therapies, including muscle relaxation exercises and mindfulness-based stress reduction. Pain management counseling. This may be individual, family, or group counseling.  Receive medical treatments Medical treatments for pain management include: Nerve block injections. These may include a pain blocker and anti-inflammatory medicines. You may have injections: Near the spine to relieve chronic back or neck pain. Into joints to relieve back or joint pain. Into nerve areas that supply a painful area to relieve body pain. Into muscles (trigger point  injections) to relieve some painful muscle conditions. A medical device placed near your spine to help block pain signals and relieve nerve pain or chronic back pain (spinal cord stimulation device). Acupuncture. Follow these instructions at home Medicines Take over-the-counter and prescription medicines only as told by your health care provider. If you are taking pain medicine, ask your health care providers about possible side effects to watch out for. Do not drive or use heavy machinery while taking prescription opioid pain medicine. Lifestyle  Do not use drugs or alcohol to reduce pain. If you drink alcohol, limit how much you have to: 0-1 drink a day for women who are not pregnant. 0-2 drinks a day for men. Know how much alcohol is in a drink. In the [place] one drink equals one 12 oz bottle of beer (355 mL), one 5 oz glass of wine (148 mL), or one 1 oz glass of hard liquor (44 mL). Do not  use any products that contain nicotine or tobacco. These products include cigarettes, chewing tobacco, and vaping devices, such as e-cigarettes. If you need help quitting, ask your health care provider. Eat a healthy diet and maintain a healthy weight. Poor diet and excess weight may make pain worse. Eat foods that are high in fiber. These include fresh fruits and vegetables, whole grains, and beans. Limit foods that are high in fat and processed sugars, such as fried and sweet foods. Exercise regularly. Exercise lowers stress and may help relieve pain. Ask your health care provider what activities and exercises are safe for you. If your health care provider approves, join an exercise class that combines movement and stress reduction. Examples include yoga and tai chi. Get enough sleep. Lack of sleep may make pain worse. Lower stress as much as possible. Practice stress reduction techniques as told by your therapist. General instructions Work with all your pain management providers to find the treatments that work best for you. You are an important member of your pain management team. There are many things you can do to reduce pain on your own. Consider joining an online or in-person support group for people who have chronic pain. Keep all follow-up visits. This is important. Where to find more information You can find more information about managing pain without opioids from: American Academy of Pain Medicine: painmed.org Institute for Chronic Pain: instituteforchronicpain.org American Chronic Pain Association: theacpa.org Contact a health care provider if: You have side effects from pain medicine. Your pain gets worse or does not get better with treatments or home therapy. You are struggling with anxiety or depression. Summary Many types of pain can be managed without opioids. Chronic pain may respond better to pain management without opioids. Pain is best managed when you and a team of  health care providers work together. Pain management without opioids may include non-opioid medicines, medical treatments, physical therapy, mental health therapy, and lifestyle changes. Tell your health care providers if your pain gets worse or is not being managed well enough. This information is not intended to replace advice given to you by your health care provider. Make sure you discuss any questions you have with your health care provider. Document Revised: 06/05/2020 Document Reviewed: 06/05/2020 Elsevier Patient Education  2024 [employer].

## 2022-11-06 NOTE — Progress Notes (Signed)
Because this visit was a virtual/telehealth visit,  certain criteria was not obtained, such a blood pressure, CBG if patient is a diabetic, and timed get up and go. Any medications not marked as "taking" was not mentioned during the medication reconciliation part of the visit. Any vitals not documented were not able to be obtained due to this being a telehealth visit. Vitals that have been documented are verbally provided by the patient.  Patient was unable to self-report a recent blood pressure reading due to a lack of equipment at home via telehealth.  Subjective:   Jenna Contreras is a 77 y.o. female who presents for Medicare Annual (Subsequent) preventive examination.  Visit Complete: Virtual  I connected with  Kiesha Britton-Watkins on 11/06/22 by a audio enabled telemedicine application and verified that I am speaking with the correct person using two identifiers.  Patient Location: Home  Provider Location: Home Office  I discussed the limitations of evaluation and management by telemedicine. The patient expressed understanding and agreed to proceed.  Patient Medicare AWV questionnaire was completed by the patient on na; I have confirmed that all information answered by patient is correct and no changes since this date.  Review of Systems     Cardiac Risk Factors include: advanced age (>63men, >78 women);hypertension;sedentary lifestyle     Objective:    Today's Vitals   11/06/22 1100 11/06/22 1101  Weight: 113 lb (51.3 kg)   Height: 5\' 1"  (1.549 m)   PainSc:  7    Body mass index is 21.35 kg/m.     11/06/2022   11:00 AM 05/28/2022    3:12 PM 11/25/2021    1:45 PM 07/24/2021   11:38 PM 03/03/2021    6:29 PM 09/23/2019    2:13 PM 09/22/2019    4:32 PM  Advanced Directives  Does Patient Have a Medical Advance Directive? No No Yes No No Yes Yes  Type of Surveyor, minerals;Living will   Healthcare Power of [place];Living will Healthcare  Power of [place];Living will  Does patient want to make changes to medical advance directive?   No - Patient declined      Copy of Healthcare Power of Attorney in Chart?   No - copy requested      Would patient like information on creating a medical advance directive? No - Patient declined   No - Patient declined No - Patient declined      Current Medications (verified) Outpatient Encounter Medications as of 11/06/2022  Medication Sig   albuterol (PROAIR HFA) 108 (90 Base) MCG/ACT inhaler USE 2 INHALATIONS EVERY 4 HOURS AS NEEDED   albuterol (PROVENTIL) (2.5 MG/3ML) 0.083% nebulizer solution Take 3 mLs (2.5 mg total) by nebulization every 6 (six) hours as needed for wheezing or shortness of breath.   baclofen (LIORESAL) 10 MG tablet One bid prn caution drowsiness   chlorpheniramine (CHLOR-TRIMETON) 4 MG tablet Take 1 tablet (4 mg total) by mouth every 4 (four) hours as needed for allergies (Cough).   Cholecalciferol (VITAMIN D) 125 MCG (5000 UT) CAPS Take by mouth.   cyanocobalamin (VITAMIN B12) 1000 MCG tablet Take 1,000 mcg by mouth daily.   diclofenac (VOLTAREN) 75 MG EC tablet Take 1 tablet (75 mg total) by mouth 2 (two) times daily.   diclofenac (VOLTAREN) 75 MG EC tablet Take one tablet po BID   fluticasone (FLONASE) 50 MCG/ACT nasal spray Place 2 sprays into both nostrils daily.   hydrOXYzine (ATARAX) 25 MG tablet Take 1  tablet (25 mg total) by mouth every 4 (four) hours as needed for anxiety or itching.   hydrOXYzine (VISTARIL) 25 MG capsule Take 1 capsule (25 mg total) by mouth every 6 (six) hours as needed. Take 1 capsule (25 mg total) by mouth every 6 (six) hour as needed for hives.   levothyroxine (SYNTHROID) 75 MCG tablet Take one tablet po daily   lidocaine (XYLOCAINE) 1 % (with preservative) injection by Infiltration route.   LORazepam (ATIVAN) 0.5 MG tablet Take 1 tablet (0.5 mg total) by mouth 2 (two) times daily as needed for anxiety.   magnesium oxide (MAG-OX) 400 (240 Mg)  MG tablet Take 400 mg by mouth daily.   NONFORMULARY OR COMPOUNDED ITEM Apply topically.   ondansetron (ZOFRAN) 4 MG tablet Take 1 tablet (4 mg total) by mouth every 6 (six) hours.   OVER THE COUNTER MEDICATION [place] and dulcolax   oxyCODONE-acetaminophen (PERCOCET) 10-325 MG tablet One qid prn pain   Polyethyl Glycol-Propyl Glycol (SYSTANE ULTRA OP) Place 1 drop into both eyes at bedtime.   rizatriptan (MAXALT) 10 MG tablet TAKE AS DIRECTED, MAY REPEAT IN 2 HOURS IF NEEDED   triamcinolone acetonide (TRIESENCE) 40 MG/ML SUSP Inject into the articular space.   trolamine salicylate (ASPERCREME) 10 % cream Apply 1 application topically as needed for muscle pain.   No facility-administered encounter medications on file as of 11/06/2022.    Allergies (verified) Betadine [povidone-iodine], Prednisone, Silvadene [silver sulfadiazine], Sulfa antibiotics, Latex, Wound dressing adhesive, Adhesive [tape], Betadine [povidone iodine], Lamictal [lamotrigine], Levaquin [levofloxacin in d5w], Tizanidine, Trazodone and nefazodone, Zyrtec [cetirizine], Baclofen, Benadryl [diphenhydramine], Cephalexin, Claritin [loratadine], Demerol [meperidine], Doxycycline, Erythromycin, Keflex [cephalexin], Other, and Tetracyclines & related   History: Past Medical History:  Diagnosis Date   Anxiety    Arthritis    Arthritis    Asthma    Bipolar 2 disorder (HCC)    Cancer (HCC)    Basal Cell   Constipation    Depression    Fibromyalgia    GERD (gastroesophageal reflux disease)    Heart murmur    As small child    History of gout    History of skin cancer    HNP (herniated nucleus pulposus with myelopathy), thoracic    Hypothyroidism    Left eye injury    In ED 06/17/2014    Low BP    Migraines    OCD (obsessive compulsive disorder)    Osteoporosis    Psoriasis    Scoliosis    Past Surgical History:  Procedure Laterality Date   Arthroscopic left knee surgery     BIOPSY  10/28/2017   Procedure: BIOPSY;   Surgeon: West Bali, MD;  [facility]: [facility];  Service: Endoscopy;;  duodenal biopsy , gastric biopsy    DILATION AND CURETTAGE OF UTERUS  x3   ESOPHAGOGASTRODUODENOSCOPY (EGD) WITH PROPOFOL N/A 10/28/2017   Procedure: ESOPHAGOGASTRODUODENOSCOPY (EGD) WITH PROPOFOL;  Surgeon: West Bali, MD;  Location: [facility];  Service: Endoscopy;  Laterality: N/A;  8:15am   HIP SURGERY Right 2015   iliac wings     KNEE SURGERY Bilateral 2015, 2016   MANDIBLE SURGERY     X 2   PARTIAL KNEE ARTHROPLASTY Left 02/12/2014   Procedure: LEFT KNEE UNICOMPARTMENTAL MEDIALLY ARTHROPLASTY ;  Surgeon: Shelda Pal, MD;  Location: [facility];  Service: Orthopedics;  Laterality: Left;   PARTIAL KNEE ARTHROPLASTY Right 07/02/2014   Procedure: RIGHT UNI KNEE ARTHOPLASTY MEDIALLY;  Surgeon: Durene Romans, MD;  Location: [facility];  Service: Orthopedics;  Laterality: Right;   rod in spine     scoliosis    TONSILLECTOMY     TOTAL HIP ARTHROPLASTY Right 05/09/2013   Procedure: RIGHT TOTAL HIP ARTHROPLASTY ANTERIOR APPROACH;  Surgeon: Shelda Pal, MD;  Location: [facility];  Service: Orthopedics;  Laterality: Right;   TUBAL LIGATION     WRIST SURGERY     Family History  Problem Relation Age of Onset   Heart attack Mother 23       MULTIPLE   Heart disease Mother 71   Diabetes Mother    Colon cancer Father 67   Heart disease Brother 60   Social History   Socioeconomic History   Marital status: Widowed    Spouse name: Not on file   Number of children: 1   Years of education: 41   Highest education level: 12th grade  Occupational History   Occupation: Veterinary surgeon and retired Engineer, civil (consulting)  Tobacco Use   Smoking status: Former    Current packs/day: 0.00    Average packs/day: 2.0 packs/day for 19.0 years (38.0 ttl pk-yrs)    Types: Cigarettes    Start date: 03/09/1960    Quit date: 03/10/1979    Years since quitting: 43.6    Passive exposure: Past   Smokeless tobacco: Never   Tobacco comments:    Quit in 1981   Vaping Use   Vaping status: Never Used  Substance and Sexual Activity   Alcohol use: No    Alcohol/week: 0.0 standard drinks of alcohol    Comment: no   Drug use: No   Sexual activity: Not Currently    Birth control/protection: Post-menopausal  Other Topics Concern   Not on file  Social History Narrative   Not on file   Social Determinants of Health   Financial Resource Strain: Low Risk  (11/06/2022)   Overall Financial Resource Strain (CARDIA)    Difficulty of Paying Living Expenses: Not hard at all  Food Insecurity: No Food Insecurity (11/06/2022)   Hunger Vital Sign    Worried About Running Out of Food in the Last Year: Never true    Ran Out of Food in the Last Year: Never true  Transportation Needs: No Transportation Needs (11/06/2022)   PRAPARE - Administrator, Civil Service (Medical): No    Lack of Transportation (Non-Medical): No  Physical Activity: Inactive (11/06/2022)   Exercise Vital Sign    Days of Exercise per Week: 0 days    Minutes of Exercise per Session: 0 min  Stress: No Stress Concern Present (11/06/2022)   [employer] of Occupational Health - Occupational Stress Questionnaire    Feeling of Stress : Not at all  Social Connections: Moderately Isolated (11/06/2022)   Social Connection and Isolation Panel [NHANES]    Frequency of Communication with Friends and Family: More than three times a week    Frequency of Social Gatherings with Friends and Family: More than three times a week    Attends Religious Services: Never    Database administrator or Organizations: No    Attends Engineer, structural: Never    Marital Status: Living with partner    Tobacco Counseling Counseling given: Yes Tobacco comments: Quit in 1981   Clinical Intake:  Pre-visit preparation completed: Yes  Pain : 0-10 Pain Score: 7  Pain Type: Chronic pain Pain Location: Back (knee, hamstring, rt hand) Pain Descriptors / Indicators: Burning, Constant,  Sharp, Shooting, Spasm Pain Onset:  More than a month ago Pain Frequency: Constant     Nutritional Status: BMI of 19-24  Normal Nutritional Risks: None Diabetes: No  How often do you need to have someone help you when you read instructions, pamphlets, or other written materials from your doctor or pharmacy?: 1 - Never  Interpreter Needed?: No  Information entered by :: Abby Famous Eisenhardt, CMA   Activities of Daily Living    11/06/2022   11:16 AM 11/25/2021    1:44 PM  In your present state of health, do you have any difficulty performing the following activities:  Hearing? 0 0  Vision? 0 0  Difficulty concentrating or making decisions? 0 0  Walking or climbing stairs? 0 1  Comment  Knee Pain  Dressing or bathing? 0 1  Comment  Knee Pain  Doing errands, shopping? 0 1  Comment  Knee Pain  Preparing Food and eating ? N N  Using the Toilet? N N  In the past six months, have you accidently leaked urine? N N  Do you have problems with loss of bowel control? N N  Managing your Medications? N N  Managing your Finances? N N  Housekeeping or managing your Housekeeping? N Y  Comment  Knee Pain    Patient Care Team: Babs Sciara, MD as PCP - General (Family Medicine) Jonelle Sidle, MD as PCP - Cardiology (Cardiology) West Bali, MD (Inactive) as Consulting Physician (Gastroenterology)  Indicate any recent Medical Services you may have received from other than [facility] providers in the past year (date may be approximate).     Assessment:   This is a routine wellness examination for [place].  Hearing/Vision screen Hearing Screening - Comments:: Patient has hearing difficulties but states the hearing aids are uncomfortable and she is unable to wear them.  Vision Screening - Comments:: Patient states they wear reading glasses only.  She is utd with yearly eye exams with Dr. Dione Booze  Dietary issues and exercise activities discussed:     Goals Addressed             This  Visit's Progress    Patient Stated       Have knee and hand surgery.        Depression Screen    11/06/2022   11:05 AM 06/17/2022   10:42 AM 03/18/2022    9:49 AM 02/18/2022   11:04 AM 11/25/2021    1:40 PM 05/05/2021    1:01 PM 10/29/2020   11:49 AM  PHQ 2/9 Scores  PHQ - 2 Score 0 0 0 0 0 0 6  PHQ- 9 Score  3 7 3  11 19     Fall Risk    11/06/2022   11:16 AM 06/17/2022   10:42 AM 03/18/2022    9:49 AM 02/18/2022   11:04 AM 11/25/2021    1:43 PM  Fall Risk   Falls in the past year? 0 1 1 1 1   Number falls in past yr: 0 0 1 1 0  Injury with Fall? 0 0 0 0 1  Risk for fall due to : No Fall Risks  Impaired mobility;Impaired balance/gait  Impaired balance/gait;Impaired mobility;Other (Comment)  Risk for fall due to: Comment     Someone recently fell on patient's knee.  Follow up Falls prevention discussed  Falls evaluation completed;Education provided;Falls prevention discussed  Falls evaluation completed;Education provided;Falls prevention discussed;Follow up appointment    MEDICARE RISK AT HOME: Medicare Risk at Home Any stairs in or around  the home?: No If so, are there any without handrails?: No Home free of loose throw rugs in walkways, pet beds, electrical cords, etc?: Yes Adequate lighting in your home to reduce risk of falls?: Yes Life alert?: No Use of a cane, walker or w/c?: Yes Grab bars in the bathroom?: Yes Shower chair or bench in shower?: No Elevated toilet seat or a handicapped toilet?: No  TIMED UP AND GO:  Was the test performed?  No    Cognitive Function:        11/06/2022   11:05 AM  6CIT Screen  What Year? 0 points  What month? 0 points  What time? 0 points  Count back from 20 0 points  Months in reverse 0 points  Repeat phrase 0 points  Total Score 0 points    Immunizations Immunization History  Administered Date(s) Administered   Fluad Quad(high Dose 65+) 01/19/2020   Influenza Split 03/13/2011, 11/18/2011, 02/24/2013    Influenza,inj,Quad PF,6+ Mos 01/18/2014, 02/18/2015, 11/22/2015, 11/25/2016, 12/15/2017   Influenza-Unspecified 12/08/2011, 01/19/2020   Moderna Sars-Covid-2 Vaccination 10/15/2019   Pneumococcal Conjugate-13 01/18/2014   Pneumococcal Polysaccharide-23 03/13/2011   Rabies, IM 01/06/2017, 01/09/2017, 01/13/2017, 01/20/2017   Tdap 01/06/2017, 09/23/2019   Zoster, Live 01/28/2012    TDAP status: Up to date  Flu Vaccine status: Due, Education has been provided regarding the importance of this vaccine. Advised may receive this vaccine at local pharmacy or Health Dept. Aware to provide a copy of the vaccination record if obtained from local pharmacy or Health Dept. Verbalized acceptance and understanding.  Pneumococcal vaccine status: Up to date  Covid-19 vaccine status: Information provided on how to obtain vaccines.   Qualifies for Shingles Vaccine? Yes   Zostavax completed No   Shingrix Completed?: No.    Education has been provided regarding the importance of this vaccine. Patient has been advised to call insurance company to determine out of pocket expense if they have not yet received this vaccine. Advised may also receive vaccine at local pharmacy or Health Dept. Verbalized acceptance and understanding.  Screening Tests Health Maintenance  Topic Date Due   Hepatitis C Screening  Never done   Zoster Vaccines- Shingrix (1 of 2) 02/26/1965   COVID-19 Vaccine (2 - Moderna risk series) 11/12/2019   Medicare Annual Wellness (AWV)  03/18/2021   DEXA SCAN  06/26/2022   INFLUENZA VACCINE  10/08/2022   DTaP/Tdap/Td (3 - Td or Tdap) 09/22/2029   Pneumonia Vaccine 56+ Years old  Completed   HPV VACCINES  Aged Out   Colonoscopy  Discontinued    Health Maintenance  Health Maintenance Due  Topic Date Due   Hepatitis C Screening  Never done   Zoster Vaccines- Shingrix (1 of 2) 02/26/1963   COVID-19 Vaccine (2 - Moderna risk series) 11/12/2019   Medicare Annual Wellness (AWV)  03/18/2021    DEXA SCAN  06/26/2022   INFLUENZA VACCINE  10/08/2022    Colorectal cancer screening: No longer required.   Mammogram status: Ordered 11/06/2022. Pt provided with contact info and advised to call to schedule appt.   Bone Density status: Ordered 11/06/2022. Pt provided with contact info and advised to call to schedule appt.  Lung Cancer Screening: (Low Dose CT Chest recommended if Age 20-80 years, 20 pack-year currently smoking OR have quit w/in 15years.) does not qualify.   Lung Cancer Screening Referral: na  Additional Screening:  Hepatitis C Screening: does qualify; Ordered 11/06/2022  Vision Screening: Recommended annual ophthalmology exams for early detection of glaucoma  and other disorders of the eye. Is the patient up to date with their annual eye exam?  Yes  Who is the provider or what is the name of the office in which the patient attends annual eye exams? Dr. Dione Booze If pt is not established with a provider, would they like to be referred to a provider to establish care? No .   Dental Screening: Recommended annual dental exams for proper oral hygiene  Diabetic Foot Exam: n/a  Community Resource Referral / Chronic Care Management: CRR required this visit?  No   CCM required this visit?  No     Plan:     I have personally reviewed and noted the following in the patient's chart:   Medical and social history Use of alcohol, tobacco or illicit drugs  Current medications and supplements including opioid prescriptions. Patient is currently taking opioid prescriptions. Information provided to patient regarding non-opioid alternatives. Patient advised to discuss non-opioid treatment plan with their provider. Functional ability and status Nutritional status Physical activity Advanced directives List of other physicians Hospitalizations, surgeries, and ER visits in previous 12 months Vitals Screenings to include cognitive, depression, and falls Referrals and  appointments  In addition, I have reviewed and discussed with patient certain preventive protocols, quality metrics, and best practice recommendations. A written personalized care plan for preventive services as well as general preventive health recommendations were provided to patient.     Jordan Hawks Guila Owensby, CMA   11/06/2022   After Visit Summary: (Mail) Due to this being a telephonic visit, the after visit summary with patients personalized plan was offered to patient via mail   Nurse Notes: Patient did not schedule her new pt appt with endocrinology. She is asking for a new referral so that she can get an appt.

## 2022-12-08 ENCOUNTER — Telehealth: Payer: Self-pay | Admitting: Family Medicine

## 2022-12-08 ENCOUNTER — Other Ambulatory Visit: Payer: Self-pay | Admitting: Family Medicine

## 2022-12-08 NOTE — Telephone Encounter (Signed)
[facility] called about refill on patient's oxycodone 10/325 last filled 04/18/22 her last two prescriptions she didn't get filled didn't need them at the time.and they expired. Please advise

## 2022-12-09 NOTE — Telephone Encounter (Signed)
Anytime it has been this long since the patient has been seen unfortunately I cannot send in strong pain medication and she needs to do an office visit thank you it would be wise for the office visit to be with me Once she gets the appointment scheduled I would be willing to send in just a few that she could use sparingly but because of state law I cannot send in full scripts

## 2022-12-11 ENCOUNTER — Encounter: Payer: Self-pay | Admitting: *Deleted

## 2022-12-11 NOTE — Telephone Encounter (Signed)
Provider's recommendations sent via my chart

## 2023-01-21 DIAGNOSIS — H04123 Dry eye syndrome of bilateral lacrimal glands: Secondary | ICD-10-CM | POA: Diagnosis not present

## 2023-01-21 DIAGNOSIS — H18513 Endothelial corneal dystrophy, bilateral: Secondary | ICD-10-CM | POA: Diagnosis not present

## 2023-01-21 DIAGNOSIS — H43393 Other vitreous opacities, bilateral: Secondary | ICD-10-CM | POA: Diagnosis not present

## 2023-02-10 ENCOUNTER — Telehealth: Payer: Self-pay | Admitting: *Deleted

## 2023-02-10 NOTE — Telephone Encounter (Signed)
Patient needs doing office visit for pain management This office visit needs to be with Dr. Lilyan Punt not with Dr. Adriana Simas thank you

## 2023-02-10 NOTE — Telephone Encounter (Unsigned)
Copied from CRM [id number]. Topic: Clinical - Medication Refill >> Feb 10, 2023  3:54 PM Theodis Sato wrote: Most Recent Primary Care Visit:  Provider: Recardo Evangelist A  Department: [facility]  Visit Type: MEDICARE AWV, SEQUENTIAL  Date: 11/06/2022  Medication: oxyCODONE-acetaminophen (PERCOCET) 10-325 MG tablet/ Muscle relaxser bslofen 10 mg 2 x a day  Has the patient contacted their pharmacy? Yes PT states she has been waiting month for Dr.Scott to re-fill the scripts- PT is very upset (Agent: If no, request that the patient contact the pharmacy for the refill. If patient does not wish to contact the pharmacy document the reason why and proceed with request.) (Agent: If yes, when and what did the pharmacy advise?)  Is this the correct pharmacy for this prescription? Yes If no, delete pharmacy and type the correct one.  This is the patient's preferred pharmacy:    [facility] - [place], [place] - [address] [place] [address] Phone: [phone] Fax: [phone]   Has the prescription been filled recently? No  Is the patient out of the medication? Yes  Has the patient been seen for an appointment in the last year OR does the patient have an upcoming appointment? Yes  Can we respond through MyChart? NO  Agent: Please be advised that Rx refills may take up to 3 business days. We ask that you follow-up with your pharmacy.

## 2023-02-11 NOTE — Telephone Encounter (Signed)
Appointment switched to Dr Lorin Picket

## 2023-02-15 ENCOUNTER — Ambulatory Visit: Payer: Medicare Other | Admitting: Family Medicine

## 2023-02-19 ENCOUNTER — Ambulatory Visit (INDEPENDENT_AMBULATORY_CARE_PROVIDER_SITE_OTHER): Payer: Medicare Other | Admitting: Family Medicine

## 2023-02-19 VITALS — BP 101/65 | HR 80 | Temp 98.8°F | Ht 61.0 in | Wt 113.0 lb

## 2023-02-19 DIAGNOSIS — E038 Other specified hypothyroidism: Secondary | ICD-10-CM

## 2023-02-19 DIAGNOSIS — R2689 Other abnormalities of gait and mobility: Secondary | ICD-10-CM | POA: Diagnosis not present

## 2023-02-19 DIAGNOSIS — Z79899 Other long term (current) drug therapy: Secondary | ICD-10-CM | POA: Diagnosis not present

## 2023-02-19 DIAGNOSIS — E559 Vitamin D deficiency, unspecified: Secondary | ICD-10-CM

## 2023-02-19 DIAGNOSIS — E039 Hypothyroidism, unspecified: Secondary | ICD-10-CM

## 2023-02-19 DIAGNOSIS — M546 Pain in thoracic spine: Secondary | ICD-10-CM

## 2023-02-19 DIAGNOSIS — Z1159 Encounter for screening for other viral diseases: Secondary | ICD-10-CM

## 2023-02-19 DIAGNOSIS — R52 Pain, unspecified: Secondary | ICD-10-CM

## 2023-02-19 DIAGNOSIS — G8929 Other chronic pain: Secondary | ICD-10-CM | POA: Diagnosis not present

## 2023-02-19 DIAGNOSIS — E785 Hyperlipidemia, unspecified: Secondary | ICD-10-CM | POA: Diagnosis not present

## 2023-02-19 MED ORDER — OXYCODONE-ACETAMINOPHEN 5-325 MG PO TABS: 1.0000 | ORAL_TABLET | ORAL | 0 refills | Status: AC | PRN

## 2023-02-19 MED ORDER — LEVOTHYROXINE SODIUM 75 MCG PO TABS: ORAL_TABLET | ORAL | 1 refills | Status: DC

## 2023-02-19 NOTE — Progress Notes (Signed)
Subjective:    Patient ID: Jenna Contreras, female    DOB: [birth year], 77 y.o.   MRN: [id number]  Discussed the use of AI scribe software for clinical note transcription with the patient, who gave verbal consent to proceed.  History of Present Illness   The patient, with a history of back surgery involving rod placement, presents with persistent back pain. She reports that the curvature in her spine above the rods is causing the scapula to protrude, leading to discomfort. The patient was informed by a physical therapist that the curve is as severe as the two that were previously straightened. She was advised by doctors at [facility] that the initial back surgery was botched, with rods placed too high and iliac wings incorrectly inserted into the pelvic bones. The patient is considering corrective surgery but is hesitant due to concerns about potential complications, including damage to the lymph system.  The patient also reports pain from the iliac nodules due to screws inserted through them. She manages her pain with Percocet, which she takes sparingly to avoid potential liver damage. The patient has a supply of hydrocodone but finds it insufficient for her level of pain. She expresses a desire to switch to a stronger medication, such as oxycodone.  In addition to her back issues, the patient has multiple pending surgeries, including procedures on both hands and breast surgery due to a burst implant. She expresses concern about the potential impact of long anesthesia durations on cognitive function, particularly the risk of dementia in older patients.  The patient also reports problems with her right hip, which began a week after a doctor suggested she might need a hip replacement. She describes the pain as severe enough to prevent her from climbing steps. The patient also mentions muscle contractions in her spine that can lead to paralysis, and muscle spasms in her hamstrings that cause  significant discomfort.  The patient is considering physical therapy for her knee and back pain, but is hesitant due to previous experiences with physical therapy. She expresses frustration with her physical limitations, feeling mentally young but physically much older. Despite her numerous health issues, the patient remains active, volunteering at her church and visiting hospital patients.         Review of Systems     Objective:    Physical Exam   CHEST: Clear breath sounds on auscultation. MUSCULOSKELETAL: Prominent spinal bone with surrounding muscular knots. Rhomboid muscle atrophy noted, with limitations on exercise and strengthening due to spinal stabilization by rods. SKIN: Scleral thinning with yellowing appearance, not indicative of jaundice.           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 Patient reports curvature in spine above the rods causing pain and affecting balance. Patient is considering corrective surgery but is concerned about potential complications. -Continue current pain management with Percocet as needed. -Consider referral for physical therapy. -Discuss potential benefits and risks of corrective surgery with specialist.  Breast Implant Rupture Patient reports a ruptured breast implant since January. Plans to have the ruptured implant removed and replaced. -Encourage patient to schedule surgery for implant replacement.  Chronic Pain Management Patient is currently on Percocet and hydrocodone for pain management. Patient reports not taking the medication as frequently as prescribed due to concerns about kidney function and ulcers. -Transition patient to oxycodone 5mg , up to 2 tablets per day as needed for pain. -Check in  with patient in 4 weeks to assess pain management and potential need for dose adjustment.  General Health Maintenance -Order standard blood work  including cholesterol, thyroid function, kidney function, and liver function. -Continue current medications including Crestor for cholesterol management, tamsulosin for BPH, and thyroid medication.      1. Chronic bilateral thoracic back pain (Primary) Patient would benefit from physical therapy A surgeon at [facility] recommend the possibility of surgery this would be a big undertaking for the patient she is uncertain if she will follow through on that I am concerned about the possibility of her becoming more debilitated over time because of her back but she is doing remarkably well given her previous history and surgery as for the pain she has been on pain medicine before and she is used pain medicine sparingly over the past several months she needs a refill we will start off with 5 mg and have her follow-up within 30 days do a urine drug screen at that time and a pain management contract - Ambulatory referral to Physical Therapy - Hepatitis B surface antigen  2. Encounter for pain management Patient previously under pain management contract we did discuss reinitiating pain meds at her request We will avoid concomitant use of benzodiazepines and muscle relaxers with this  3. Other specified hypothyroidism Patient due for lab work regarding this  4. Poor balance Physical therapy would be of help  5. High risk medication use See labs - Basic Metabolic Panel - CBC with Differential - Hepatic Function Panel  6. Vitamin D deficiency See labs - Vitamin D, 25-hydroxy  7. Hypothyroidism, unspecified type Take meds see labs - TSH + free T4  8. Mild hyperlipidemia Healthy diet see labs - Lipid Panel  9. Need for hepatitis B screening test See labs  10. Need for hepatitis C screening test See labs - Hepatitis C Antibody

## 2023-02-28 ENCOUNTER — Emergency Department (HOSPITAL_COMMUNITY)
Admission: EM | Admit: 2023-02-28 | Discharge: 2023-02-28 | Disposition: A | Discharge status: Home / Self Care | Payer: Medicare Other | Attending: Emergency Medicine | Admitting: Emergency Medicine

## 2023-02-28 ENCOUNTER — Other Ambulatory Visit: Payer: Self-pay

## 2023-02-28 ENCOUNTER — Emergency Department (HOSPITAL_COMMUNITY): Payer: Medicare Other

## 2023-02-28 ENCOUNTER — Encounter (HOSPITAL_COMMUNITY): Payer: Self-pay | Admitting: Emergency Medicine

## 2023-02-28 DIAGNOSIS — Z91040 Latex allergy status: Secondary | ICD-10-CM | POA: Insufficient documentation

## 2023-02-28 DIAGNOSIS — J45909 Unspecified asthma, uncomplicated: Secondary | ICD-10-CM | POA: Insufficient documentation

## 2023-02-28 DIAGNOSIS — Z7951 Long term (current) use of inhaled steroids: Secondary | ICD-10-CM | POA: Diagnosis not present

## 2023-02-28 DIAGNOSIS — E039 Hypothyroidism, unspecified: Secondary | ICD-10-CM | POA: Insufficient documentation

## 2023-02-28 DIAGNOSIS — R1084 Generalized abdominal pain: Secondary | ICD-10-CM | POA: Diagnosis not present

## 2023-02-28 DIAGNOSIS — R109 Unspecified abdominal pain: Secondary | ICD-10-CM | POA: Diagnosis not present

## 2023-02-28 DIAGNOSIS — R197 Diarrhea, unspecified: Secondary | ICD-10-CM | POA: Insufficient documentation

## 2023-02-28 DIAGNOSIS — E860 Dehydration: Secondary | ICD-10-CM | POA: Diagnosis not present

## 2023-02-28 DIAGNOSIS — Z79899 Other long term (current) drug therapy: Secondary | ICD-10-CM | POA: Diagnosis not present

## 2023-02-28 DIAGNOSIS — R112 Nausea with vomiting, unspecified: Secondary | ICD-10-CM | POA: Diagnosis not present

## 2023-02-28 DIAGNOSIS — G4489 Other headache syndrome: Secondary | ICD-10-CM | POA: Diagnosis not present

## 2023-02-28 DIAGNOSIS — R1011 Right upper quadrant pain: Secondary | ICD-10-CM | POA: Diagnosis not present

## 2023-02-28 DIAGNOSIS — R1111 Vomiting without nausea: Secondary | ICD-10-CM | POA: Diagnosis not present

## 2023-02-28 LAB — COMPREHENSIVE METABOLIC PANEL
ALT: 15 U/L (ref 0–44)
AST: 19 U/L (ref 15–41)
Albumin: 3.6 g/dL (ref 3.5–5.0)
Alkaline Phosphatase: 56 U/L (ref 38–126)
Anion gap: 9 (ref 5–15)
BUN: 20 mg/dL (ref 8–23)
CO2: 23 mmol/L (ref 22–32)
Calcium: 8.5 mg/dL — ABNORMAL LOW (ref 8.9–10.3)
Chloride: 104 mmol/L (ref 98–111)
Creatinine, Ser: 0.6 mg/dL (ref 0.44–1.00)
GFR, Estimated: >60 mL/min (ref >60)
Glucose, Bld: 91 mg/dL (ref 70–99)
Potassium: 3.5 mmol/L (ref 3.5–5.1)
Sodium: 136 mmol/L (ref 135–145)
Total Bilirubin: 0.8 mg/dL (ref <1.2)
Total Protein: 6.2 g/dL — ABNORMAL LOW (ref 6.5–8.1)

## 2023-02-28 LAB — CBC WITH DIFFERENTIAL/PLATELET
Abs Immature Granulocytes: 0.02 10*3/uL (ref 0.00–0.07)
Basophils Absolute: 0 10*3/uL (ref 0.0–0.1)
Basophils Relative: 0 %
Eosinophils Absolute: 0.1 10*3/uL (ref 0.0–0.5)
Eosinophils Relative: 1 %
HCT: 39.2 % (ref 36.0–46.0)
Hemoglobin: 12.6 g/dL (ref 12.0–15.0)
Immature Granulocytes: 0 %
Lymphocytes Relative: 9 %
Lymphs Abs: 0.5 10*3/uL — ABNORMAL LOW (ref 0.7–4.0)
MCH: 29.1 pg (ref 26.0–34.0)
MCHC: 32.1 g/dL (ref 30.0–36.0)
MCV: 90.5 fL (ref 80.0–100.0)
Monocytes Absolute: 0.2 10*3/uL (ref 0.1–1.0)
Monocytes Relative: 3 %
Neutro Abs: 4.5 10*3/uL (ref 1.7–7.7)
Neutrophils Relative %: 87 %
Platelets: 208 10*3/uL (ref 150–400)
RBC: 4.33 MIL/uL (ref 3.87–5.11)
RDW: 13.9 % (ref 11.5–15.5)
WBC: 5.2 10*3/uL (ref 4.0–10.5)
nRBC: 0 % (ref 0.0–0.2)

## 2023-02-28 LAB — URINALYSIS, ROUTINE W REFLEX MICROSCOPIC
Bilirubin Urine: NEGATIVE
Glucose, UA: NEGATIVE mg/dL
Hgb urine dipstick: NEGATIVE
Ketones, ur: 20 mg/dL — AB
Leukocytes,Ua: NEGATIVE
Nitrite: NEGATIVE
Protein, ur: NEGATIVE mg/dL
Specific Gravity, Urine: 1.023 (ref 1.005–1.030)
pH: 5 (ref 5.0–8.0)

## 2023-02-28 LAB — LIPASE, BLOOD: Lipase: 23 U/L (ref 11–51)

## 2023-02-28 MED ORDER — IOHEXOL 300 MG/ML  SOLN
80.0000 mL | Freq: Once | INTRAMUSCULAR | Status: AC | PRN
  Administered 2023-02-28: 80 mL via INTRAVENOUS

## 2023-02-28 MED ORDER — IOHEXOL 300 MG/ML  SOLN
30.0000 mL | Freq: Once | INTRAMUSCULAR | Status: AC | PRN
  Administered 2023-02-28: 30 mL via ORAL

## 2023-02-28 MED ORDER — SODIUM CHLORIDE 0.9 % IV BOLUS
500.0000 mL | Freq: Once | INTRAVENOUS | Status: AC
  Administered 2023-02-28: 500 mL via INTRAVENOUS

## 2023-02-28 MED ORDER — ONDANSETRON HCL 4 MG PO TABS: 4.0000 mg | ORAL_TABLET | Freq: Four times a day (QID) | ORAL | 0 refills | Status: AC | PRN

## 2023-02-28 MED ORDER — ONDANSETRON HCL 4 MG PO TABS: 4.0000 mg | ORAL_TABLET | Freq: Four times a day (QID) | ORAL | 0 refills | Status: DC | PRN

## 2023-02-28 MED ORDER — OXYCODONE-ACETAMINOPHEN 5-325 MG PO TABS
1.0000 | ORAL_TABLET | Freq: Once | ORAL | Status: AC
  Administered 2023-02-28: 1 via ORAL
  Filled 2023-02-28: qty 1

## 2023-02-28 MED ORDER — MORPHINE SULFATE (PF) 2 MG/ML IV SOLN
2.0000 mg | Freq: Once | INTRAVENOUS | Status: AC
  Administered 2023-02-28: 2 mg via INTRAVENOUS
  Filled 2023-02-28: qty 1

## 2023-02-28 MED ORDER — ONDANSETRON HCL 4 MG/2ML IJ SOLN
4.0000 mg | Freq: Once | INTRAMUSCULAR | Status: AC
  Administered 2023-02-28: 4 mg via INTRAVENOUS
  Filled 2023-02-28: qty 2

## 2023-02-28 NOTE — ED Triage Notes (Signed)
 Pt c/o abd pain with n/v/d since last night.

## 2023-02-28 NOTE — ED Notes (Signed)
Encouraged patient to urinate. Pt unable at this time

## 2023-02-28 NOTE — ED Provider Notes (Signed)
[place] EMERGENCY DEPARTMENT AT [facility] Provider Note   CSN: [id number] Arrival date & time: 02/28/23  1540     History  Chief Complaint  Patient presents with   Abdominal Pain    Jenna Contreras is a 77 y.o. female.   Abdominal Pain Associated symptoms: diarrhea, nausea and vomiting   Associated symptoms: no chest pain, no chills, no dysuria, no fever, no shortness of breath and no sore throat        Jenna Contreras is a 77 y.o. female with past medical history of hypothyroidism, GERD, fibromyalgia, asthma and anxiety who presents to the Emergency Department complaining of sudden onset nausea vomiting diarrhea and abdominal pain.  Symptoms began last evening.  Reports multiple episodes of vomiting with diffuse abdominal pain.  Several episodes of loose brown stools this morning.  Has drank 8 ounces of Gatorade today without further vomiting.  Continues to have diffuse abdominal pain.  Denies any black or bloody stools, fever, chills, or new medications.  Home Medications Prior to Admission medications   Medication Sig Start Date End Date Taking? Authorizing Provider  albuterol [facility] HFA) 108 (90 Base) MCG/ACT inhaler USE 2 INHALATIONS EVERY 4 HOURS AS NEEDED 07/18/20   Glenford Bayley, NP  albuterol (PROVENTIL) (2.5 MG/3ML) 0.083% nebulizer solution Take 3 mLs (2.5 mg total) by nebulization every 6 (six) hours as needed for wheezing or shortness of breath. 05/14/20   Glenford Bayley, NP  baclofen (LIORESAL) 10 MG tablet One bid prn caution drowsiness 05/11/22   Babs Sciara, MD  chlorpheniramine (CHLOR-TRIMETON) 4 MG tablet Take 1 tablet (4 mg total) by mouth every 4 (four) hours as needed for allergies (Cough). 03/27/21   Glenford Bayley, NP  Cholecalciferol (VITAMIN D) 125 MCG (5000 UT) CAPS Take by mouth.    [provider]  cyanocobalamin (VITAMIN B12) 1000 MCG tablet Take 1,000 mcg by mouth daily.    [provider]   fluticasone (FLONASE) 50 MCG/ACT nasal spray Place 2 sprays into both nostrils daily. Patient not taking: Reported on 02/19/2023 03/12/21   Glenford Bayley, NP  hydrOXYzine (ATARAX) 25 MG tablet Take 1 tablet (25 mg total) by mouth every 4 (four) hours as needed for anxiety or itching. 07/03/22   Babs Sciara, MD  hydrOXYzine (VISTARIL) 25 MG capsule Take 1 capsule (25 mg total) by mouth every 6 (six) hours as needed. Take 1 capsule (25 mg total) by mouth every 6 (six) hour as needed for hives. 10/19/22   Babs Sciara, MD  levothyroxine (SYNTHROID) 75 MCG tablet Take one tablet po daily 02/19/23   Luking, Jonna Coup, MD  lidocaine (XYLOCAINE) 1 % (with preservative) injection by Infiltration route. Patient not taking: Reported on 02/19/2023 04/29/21   [provider]  magnesium oxide (MAG-OX) 400 (240 Mg) MG tablet Take 400 mg by mouth daily.    [provider]  NONFORMULARY OR COMPOUNDED ITEM Apply topically. 12/10/20   [provider]  ondansetron (ZOFRAN) 4 MG tablet Take 1 tablet (4 mg total) by mouth every 6 (six) hours. 09/02/17   Babs Sciara, MD  OVER THE COUNTER MEDICATION [place] and dulcolax    [provider]  Polyethyl Glycol-Propyl Glycol (SYSTANE ULTRA OP) Place 1 drop into both eyes at bedtime.    [provider]  rizatriptan (MAXALT) 10 MG tablet TAKE AS DIRECTED, MAY REPEAT IN 2 HOURS IF NEEDED 08/27/22   Babs Sciara, MD  triamcinolone acetonide (TRIESENCE) 40  MG/ML SUSP Inject into the articular space. Patient not taking: Reported on 02/19/2023 04/29/21   [provider]  trolamine salicylate (ASPERCREME) 10 % cream Apply 1 application topically as needed for muscle pain.    [provider]      Allergies    Betadine [povidone-iodine], Prednisone, Silvadene [silver sulfadiazine], Sulfa antibiotics, Latex, Wound dressing adhesive, Adhesive [tape], Betadine [povidone iodine], Lamictal [lamotrigine], Levaquin  [levofloxacin in d5w], Tizanidine, Trazodone and nefazodone, Zyrtec [cetirizine], Baclofen, Benadryl [diphenhydramine], Cephalexin, Claritin [loratadine], Demerol [meperidine], Doxycycline, Erythromycin, Keflex [cephalexin], Other, and Tetracyclines & related    Review of Systems   Review of Systems  Constitutional:  Negative for appetite change, chills and fever.  HENT:  Negative for sore throat and trouble swallowing.   Respiratory:  Negative for shortness of breath.   Cardiovascular:  Negative for chest pain.  Gastrointestinal:  Positive for abdominal pain, diarrhea, nausea and vomiting.  Genitourinary:  Negative for difficulty urinating and dysuria.  Musculoskeletal:  Negative for arthralgias and myalgias.  Neurological:  Negative for dizziness, weakness, numbness and headaches.    Physical Exam Updated Vital Signs BP (!) 106/54   Pulse 75   Temp 98.4 F (36.9 C) (Oral)   Resp 17   Ht 5\' 1"  (1.549 m)   Wt 51 kg   SpO2 99%   BMI 21.24 kg/m  Physical Exam Vitals and nursing note reviewed.  Constitutional:      General: She is not in acute distress.    Appearance: Normal appearance. She is well-developed. She is not ill-appearing or toxic-appearing.  HENT:     Mouth/Throat:     Mouth: Mucous membranes are dry.  Eyes:     Extraocular Movements: Extraocular movements intact.     Conjunctiva/sclera: Conjunctivae normal.     Pupils: Pupils are equal, round, and reactive to light.  Cardiovascular:     Rate and Rhythm: Normal rate and regular rhythm.     Pulses: Normal pulses.  Pulmonary:     Effort: Pulmonary effort is normal.  Chest:     Chest wall: No tenderness.  Abdominal:     General: There is no distension.     Palpations: Abdomen is soft.     Tenderness: There is abdominal tenderness. There is no guarding.     Comments: Diffuse tenderness to palpation.  No abdominal distention, abdomen is soft.  Musculoskeletal:        General: Normal range of motion.     Right  lower leg: No edema.     Left lower leg: No edema.  Skin:    General: Skin is warm.     Capillary Refill: Capillary refill takes less than 2 seconds.  Neurological:     General: No focal deficit present.     Mental Status: She is alert.     ED Results / Procedures / Treatments   Labs (all labs ordered are listed, but only abnormal results are displayed) Labs Reviewed  CBC WITH DIFFERENTIAL/PLATELET - Abnormal; Notable for the following components:      Result Value   Lymphs Abs 0.5 (*)    All other components within normal limits  COMPREHENSIVE METABOLIC PANEL - Abnormal; Notable for the following components:   Calcium 8.5 (*)    Total Protein 6.2 (*)    All other components within normal limits  LIPASE, BLOOD  URINALYSIS, ROUTINE W REFLEX MICROSCOPIC    EKG None  Radiology No results found.  Procedures Procedures    Medications Ordered in ED  Medications - No data to display  ED Course/ Medical Decision Making/ A&P                                 Medical Decision Making Patient here for evaluation of generalized abdominal pain, nausea vomiting and diarrhea.  Symptoms began last evening.  No fever or dysuria symptoms.  Possible sick contacts several days ago with someone who had vomiting.  No recent abdominal surgeries.  Differential would include but not limited to gastroenteritis, acute abdominal process also considered.  No right upper quadrant to suggest gallbladder issue.  No left lower quadrant tenderness suggestive of acute diverticulitis  Amount and/or Complexity of Data Reviewed Labs: ordered.    Details: Labs no evidence of leukocytosis, chemistries unremarkable, lipase also unremarkable Radiology: ordered.    Details: CT abdomen and pelvis, results pending Discussion of management or test interpretation with external provider(s): Pt here with N/V/D with diffuse abd pain.  I suspect viral gastroenteritis. CT abd/pelvis is still pending.  I have discussed  with Burgess Amor, PA-C who agrees to review CT results.  If pt's feeling better and CT is negative, she can be d/c home.    Risk Prescription drug management.           Final Clinical Impression(s) / ED Diagnoses Final diagnoses:  Nausea vomiting and diarrhea    Rx / DC Orders ED Discharge Orders     None         Rosey Bath 02/28/23 1910    Eber Hong, MD 03/01/23 [phone]

## 2023-02-28 NOTE — ED Provider Notes (Signed)
Patient signed out to me at shift change from [facility], PA-C pending CT imaging.  Patient presenting with diffuse abdominal pain along with nausea vomiting and loose stools.  She has been afebrile and has had no further diarrhea while in the department.  She was able to tolerate p.o. intake.  Her CT imaging is negative for acute intra-abdominal findings.  Prescribe Zofran for as needed use.  She has Imodium at home, she was advised she can take this if her diarrhea returns.  PRN follow-up anticipated.    Results for orders placed or performed during the hospital encounter of 02/28/23  CBC with Differential   Collection Time: 02/28/23  4:31 PM  Result Value Ref Range   WBC 5.2 4.0 - 10.5 K/uL   RBC 4.33 3.87 - 5.11 MIL/uL   Hemoglobin 12.6 12.0 - 15.0 g/dL   HCT 30.8 65.7 - 84.6 %   MCV 90.5 80.0 - 100.0 fL   MCH 29.1 26.0 - 34.0 pg   MCHC 32.1 30.0 - 36.0 g/dL   RDW 96.2 95.2 - 84.1 %   Platelets 208 150 - 400 K/uL   nRBC 0.0 0.0 - 0.2 %   Neutrophils Relative % 87 %   Neutro Abs 4.5 1.7 - 7.7 K/uL   Lymphocytes Relative 9 %   Lymphs Abs 0.5 (L) 0.7 - 4.0 K/uL   Monocytes Relative 3 %   Monocytes Absolute 0.2 0.1 - 1.0 K/uL   Eosinophils Relative 1 %   Eosinophils Absolute 0.1 0.0 - 0.5 K/uL   Basophils Relative 0 %   Basophils Absolute 0.0 0.0 - 0.1 K/uL   Immature Granulocytes 0 %   Abs Immature Granulocytes 0.02 0.00 - 0.07 K/uL  Comprehensive metabolic panel   Collection Time: 02/28/23  4:31 PM  Result Value Ref Range   Sodium 136 135 - 145 mmol/L   Potassium 3.5 3.5 - 5.1 mmol/L   Chloride 104 98 - 111 mmol/L   CO2 23 22 - 32 mmol/L   Glucose, Bld 91 70 - 99 mg/dL   BUN 20 8 - 23 mg/dL   Creatinine, Ser 3.24 0.44 - 1.00 mg/dL   Calcium 8.5 (L) 8.9 - 10.3 mg/dL   Total Protein 6.2 (L) 6.5 - 8.1 g/dL   Albumin 3.6 3.5 - 5.0 g/dL   AST 19 15 - 41 U/L   ALT 15 0 - 44 U/L   Alkaline Phosphatase 56 38 - 126 U/L   Total Bilirubin 0.8 <1.2 mg/dL   GFR, Estimated >40  >10 mL/min   Anion gap 9 5 - 15  Lipase, blood   Collection Time: 02/28/23  4:31 PM  Result Value Ref Range   Lipase 23 11 - 51 U/L  Urinalysis, Routine w reflex microscopic -Urine, Clean Catch   Collection Time: 02/28/23  8:06 PM  Result Value Ref Range   Color, Urine YELLOW YELLOW   APPearance CLEAR CLEAR   Specific Gravity, Urine 1.023 1.005 - 1.030   pH 5.0 5.0 - 8.0   Glucose, UA NEGATIVE NEGATIVE mg/dL   Hgb urine dipstick NEGATIVE NEGATIVE   Bilirubin Urine NEGATIVE NEGATIVE   Ketones, ur 20 (A) NEGATIVE mg/dL   Protein, ur NEGATIVE NEGATIVE mg/dL   Nitrite NEGATIVE NEGATIVE   Leukocytes,Ua NEGATIVE NEGATIVE   CT ABDOMEN PELVIS W CONTRAST Result Date: 02/28/2023 CLINICAL DATA:  Abdominal pain with nausea and vomiting. EXAM: CT ABDOMEN AND PELVIS WITH CONTRAST TECHNIQUE: Multidetector CT imaging of the abdomen and pelvis was performed  using the standard protocol following bolus administration of intravenous contrast. RADIATION DOSE REDUCTION: This exam was performed according to the departmental dose-optimization program which includes automated exposure control, adjustment of the mA and/or kV according to patient size and/or use of iterative reconstruction technique. CONTRAST:  80mL OMNIPAQUE IOHEXOL 300 MG/ML SOLN, 30mL OMNIPAQUE IOHEXOL 300 MG/ML SOLN COMPARISON:  CT abdomen and pelvis dated 11/17/2017. FINDINGS: Lower chest: No acute abnormality. Hepatobiliary: No focal liver abnormality is seen. No gallstones, gallbladder wall thickening, or biliary dilatation. Pancreas: Unremarkable. No pancreatic ductal dilatation or surrounding inflammatory changes. Spleen: Normal in size without focal abnormality. Adrenals/Urinary Tract: Adrenal glands are unremarkable. Kidneys are normal, without renal calculi, suspicious focal lesion, or hydronephrosis. Bladder is unremarkable. Stomach/Bowel: Stomach is within normal limits. Enteric contrast reaches the transverse colon. Appendix appears  normal. No evidence of bowel wall thickening, distention, or inflammatory changes. Vascular/Lymphatic: Aortic atherosclerosis. No enlarged abdominal or pelvic lymph nodes. Reproductive: Uterus and bilateral adnexa are unremarkable. Other: No abdominal wall hernia or abnormality. No abdominopelvic ascites. Musculoskeletal: The patient is status post a right hip arthroplasty as well as fixation hardware from the thoracic spine to the sacrum and iliac bones. These result in streak artifact obscuring portions of the abdomen and pelvis. IMPRESSION: No acute findings in the abdomen or pelvis. Aortic Atherosclerosis (ICD10-I70.0). Electronically Signed   By: Romona Curls M.D.   On: 02/28/2023 19:30         Victoriano Lain 02/28/23 2233    Eber Hong, MD 03/01/23 [phone]

## 2023-02-28 NOTE — ED Notes (Signed)
Pt called after being discharged to see if it was okay to take her wegovy injection. Dr. Hyacinth Meeker made aware. Per Dr. Hyacinth Meeker it is okay for patient to continue taking the medication as long as it is not a new medication. Pt called and notified of Dr. Garen Lah response.

## 2023-02-28 NOTE — Discharge Instructions (Addendum)
Continue with your oral rehydration at home.  Take the Zofran if needed for return of your nausea or vomiting.  It would be okay to take Imodium if your diarrhea returns.

## 2023-03-18 DIAGNOSIS — M6281 Muscle weakness (generalized): Secondary | ICD-10-CM | POA: Diagnosis not present

## 2023-03-18 DIAGNOSIS — M25561 Pain in right knee: Secondary | ICD-10-CM | POA: Diagnosis not present

## 2023-03-18 DIAGNOSIS — R269 Unspecified abnormalities of gait and mobility: Secondary | ICD-10-CM | POA: Diagnosis not present

## 2023-03-18 DIAGNOSIS — M546 Pain in thoracic spine: Secondary | ICD-10-CM | POA: Diagnosis not present

## 2023-03-19 DIAGNOSIS — Z79899 Other long term (current) drug therapy: Secondary | ICD-10-CM | POA: Diagnosis not present

## 2023-03-19 DIAGNOSIS — E039 Hypothyroidism, unspecified: Secondary | ICD-10-CM | POA: Diagnosis not present

## 2023-03-19 DIAGNOSIS — E785 Hyperlipidemia, unspecified: Secondary | ICD-10-CM | POA: Diagnosis not present

## 2023-03-19 DIAGNOSIS — E559 Vitamin D deficiency, unspecified: Secondary | ICD-10-CM | POA: Diagnosis not present

## 2023-03-20 LAB — LIPID PANEL
Chol/HDL Ratio: 2.2 {ratio} (ref 0.0–4.4)
Cholesterol, Total: 186 mg/dL (ref 100–199)
HDL: 84 mg/dL (ref >39)
LDL Chol Calc (NIH): 95 mg/dL (ref 0–99)
Triglycerides: 32 mg/dL (ref 0–149)
VLDL Cholesterol Cal: 7 mg/dL (ref 5–40)

## 2023-03-20 LAB — BASIC METABOLIC PANEL
BUN/Creatinine Ratio: 33 — ABNORMAL HIGH (ref 12–28)
BUN: 21 mg/dL (ref 8–27)
CO2: 23 mmol/L (ref 20–29)
Calcium: 9.2 mg/dL (ref 8.7–10.3)
Chloride: 104 mmol/L (ref 96–106)
Creatinine, Ser: 0.64 mg/dL (ref 0.57–1.00)
Glucose: 82 mg/dL (ref 70–99)
Potassium: 4.2 mmol/L (ref 3.5–5.2)
Sodium: 142 mmol/L (ref 134–144)
eGFR: 90 mL/min/{1.73_m2} (ref >59)

## 2023-03-20 LAB — TSH+FREE T4
Free T4: 1.46 ng/dL (ref 0.82–1.77)
TSH: 3.56 u[IU]/mL (ref 0.450–4.500)

## 2023-03-20 LAB — CBC WITH DIFFERENTIAL/PLATELET
Basophils Absolute: 0.1 10*3/uL (ref 0.0–0.2)
Basos: 1 %
EOS (ABSOLUTE): 0.4 10*3/uL (ref 0.0–0.4)
Eos: 7 %
Hematocrit: 39.8 % (ref 34.0–46.6)
Hemoglobin: 12.9 g/dL (ref 11.1–15.9)
Immature Grans (Abs): 0 10*3/uL (ref 0.0–0.1)
Immature Granulocytes: 0 %
Lymphocytes Absolute: 2.6 10*3/uL (ref 0.7–3.1)
Lymphs: 43 %
MCH: 29.2 pg (ref 26.6–33.0)
MCHC: 32.4 g/dL (ref 31.5–35.7)
MCV: 90 fL (ref 79–97)
Monocytes Absolute: 0.4 10*3/uL (ref 0.1–0.9)
Monocytes: 7 %
Neutrophils Absolute: 2.6 10*3/uL (ref 1.4–7.0)
Neutrophils: 42 %
Platelets: 310 10*3/uL (ref 150–450)
RBC: 4.42 x10E6/uL (ref 3.77–5.28)
RDW: 12.9 % (ref 11.7–15.4)
WBC: 6.1 10*3/uL (ref 3.4–10.8)

## 2023-03-20 LAB — HEPATIC FUNCTION PANEL
ALT: 14 [IU]/L (ref 0–32)
AST: 17 [IU]/L (ref 0–40)
Albumin: 4.2 g/dL (ref 3.8–4.8)
Alkaline Phosphatase: 83 [IU]/L (ref 44–121)
Bilirubin Total: 0.5 mg/dL (ref 0.0–1.2)
Bilirubin, Direct: 0.17 mg/dL (ref 0.00–0.40)
Total Protein: 6.4 g/dL (ref 6.0–8.5)

## 2023-03-20 LAB — VITAMIN D 25 HYDROXY (VIT D DEFICIENCY, FRACTURES): Vit D, 25-Hydroxy: 101 ng/mL — ABNORMAL HIGH (ref 30.0–100.0)

## 2023-03-22 ENCOUNTER — Telehealth: Payer: Self-pay | Admitting: *Deleted

## 2023-03-22 ENCOUNTER — Ambulatory Visit (INDEPENDENT_AMBULATORY_CARE_PROVIDER_SITE_OTHER): Payer: Medicare Other | Admitting: Family Medicine

## 2023-03-22 VITALS — BP 112/72 | HR 75 | Temp 98.4°F | Ht 61.0 in | Wt 113.0 lb

## 2023-03-22 DIAGNOSIS — R269 Unspecified abnormalities of gait and mobility: Secondary | ICD-10-CM | POA: Diagnosis not present

## 2023-03-22 DIAGNOSIS — M546 Pain in thoracic spine: Secondary | ICD-10-CM | POA: Diagnosis not present

## 2023-03-22 DIAGNOSIS — M25561 Pain in right knee: Secondary | ICD-10-CM | POA: Diagnosis not present

## 2023-03-22 DIAGNOSIS — Z79891 Long term (current) use of opiate analgesic: Secondary | ICD-10-CM | POA: Diagnosis not present

## 2023-03-22 DIAGNOSIS — M6281 Muscle weakness (generalized): Secondary | ICD-10-CM | POA: Diagnosis not present

## 2023-03-22 MED ORDER — OXYCODONE-ACETAMINOPHEN 10-325 MG PO TABS: ORAL_TABLET | ORAL | 0 refills | Status: DC

## 2023-03-22 NOTE — Telephone Encounter (Signed)
 Copied from CRM [id number]. Topic: General - Other >> Mar 22, 2023  4:48 PM Deleta HERO wrote: Reason for CRM: Maddie from [facility] is wanting to determine if this patient has any restrictions that her therapist should be aware of prior to completing her physical therapy. She is requesting an update from Dr. Alphonsa, the patient's PCP is possible.  Callback #: [phone] ext: [phone]

## 2023-03-22 NOTE — Progress Notes (Signed)
 Subjective:    Patient ID: Jenna Contreras, female    DOB: [birth year], 78 y.o.   MRN: [id number]  Discussed the use of AI scribe software for clinical note transcription with the patient, who gave verbal consent to proceed.  History of Present Illness   The patient, with a history of chronic pain, reports a worsening of symptoms despite the current pain management regimen. They were initially taking one 5mg  tablet of pain medication daily, but found this insufficient to manage their pain. As a result, they resorted to using an outdated prescription for 10mg  tablets, which provided more effective pain relief. The patient reports needing the 10mg  dosage for significant pain, and occasionally requiring more than one tablet during particularly severe episodes. They deny any drowsiness or other adverse effects from the 10mg  dosage, but note that it tends to keep them awake.  The patient also reports muscle spasms that began a year after a significant personal loss. They have a history of back surgery and currently live alone, which has raised concerns about their ability to get up if they fall. They are currently attending physical therapy twice a week to address these issues. The patient also mentions significant pain in their right knee and a potential need for hip replacement.  The patient has adopted a holistic approach to their health, including an intermittent fasting diet and various supplements. They report no significant changes in weight and are keen to maintain their current weight. They also mention adopting two cats, which has increased their activity level at home. Despite their chronic pain and other health issues, the patient reports feeling generally well.     This patient was seen today for chronic pain  The medication list was reviewed and updated.   Location of Pain for which the patient has been treated with regarding narcotics: Mid back and low back pain  Onset of this  pain: Present for years   -Compliance with medication: Had been off the medicine but recently her pain got worse and she has had a return of pain medicine  - Number patient states they take daily: No more than 3/day  -Reason for ongoing use of opioids has previous surgery failed surgery for back pain has rods  What other measures have been tried outside of opioids Tylenol , NSAIDs  In the ongoing specialists regarding this condition previously seen back specialist and orthopedist  -when was the last dose patient took?  Within the past 24 hours  The patient was advised the importance of maintaining medication and not using illegal substances with these.  Here for refills and follow up  The patient was educated that we can provide 3 monthly scripts for their medication, it is their responsibility to follow the instructions.  Side effects or complications from medications: Denies any  Patient is aware that pain medications are meant to minimize the severity of the pain to allow their pain levels to improve to allow for better function. They are aware of that pain medications cannot totally remove their pain.  Due for UDT ( at least once per year) (pain management contract is also completed at the time of the UDT): Today  Scale of 1 to 10 ( 1 is least 10 is most) Your pain level without the medicine: 9 Your pain level with medication 5-6  Scale 1 to 10 ( 1-helps very little, 10 helps very well) How well does your pain medication reduce your pain so you can function better through out the day?  8  Quality of the pain: Throbbing aching  Persistence of the pain: Present all the time  Modifying factors: Worse with activity          Review of Systems     Objective:    Physical Exam   CHEST: Respiratory effort normal, lung fields clear to auscultation. CARDIOVASCULAR: Heart sounds normal upon auscultation.    General-in no acute distress Eyes-no discharge Lungs-respiratory  rate normal, CTA CV-no murmurs,RRR Extremities skin warm dry no edema Neuro grossly normal Behavior normal, alert        Assessment & Plan:  Assessment and Plan    Chronic Pain Increased pain reported with 5mg  pain medication, improved with 10mg . No adverse effects reported with 10mg  dose. -Continue 10mg  pain medication up to three times daily. -Plan to monitor usage over the next four weeks to determine average daily requirement. -Perform urine drug screen today. -Sign pain management contract.  Vitamin D  Supplementation Recent lab results show Vitamin D  levels above the normal range. -Reduce Vitamin D3 supplementation from 5000IU to 2000-3000IU daily.  Bone Density Plan for follow-up bone density test in the spring. -Continue current diet and supplementation regimen.  General Health Maintenance -Physical therapy ongoing for muscle spasms and mobility. -Plan to follow up in two months. -Encourage patient to learn to use MyChart for communication and appointment management.     The patient was seen in followup for chronic pain. A review over at their current pain status was discussed. Drug registry was checked. Prescriptions were given.  Regular follow-up recommended. Discussion was held regarding the importance of compliance with medication as well as pain medication contract.  Patient was informed that medication may cause drowsiness and should not be combined  with other medications/alcohol  or street drugs. If the patient feels medication is causing altered alertness then do not drive or operate dangerous equipment.  Should be noted that the patient appears to be meeting appropriate use of opioids and response.  Evidenced by improved function and decent pain control without significant side effects and no evidence of overt aberrancy issues.  Upon discussion with the patient today they understand that opioid therapy is optional and they feel that the pain has been refractory  to reasonable conservative measures and is significant and affecting quality of life enough to warrant ongoing therapy and wishes to continue opioids.  Refills were provided.  [place]  medical Board guidelines regarding the pain medicine has been reviewed.  CDC guidelines most updated 2022 has been reviewed by the prescriber.  PDMP is checked on a regular basis yearly urine drug screen and pain management contract  Today will give the patient 60 tablets it seems that she takes the tablets twice a day and sometimes 3 times a day She states 5 mg did not do enough for her pain she states she tolerates 10 mg she was on this before denies having drowsiness with it  We did review the tenets of pain management and had her sign a contract Urine drug screen today When she runs low on this particular prescription she will let us  know and we will send an additional prescription she will follow-up in 2 months and if doing well at that time we will space this out to every 3 months

## 2023-03-23 NOTE — Telephone Encounter (Signed)
 Left message to return call to notify of the provider's message below

## 2023-03-23 NOTE — Telephone Encounter (Signed)
 Stretching should be fine Gentle strength training only as patient tolerates her states is within her limits She has had significant back surgery several years ago that placed rods in the back which greatly limits her range of motion and has caused her to be fairly weak I think as long as the the physical therapist works within the confines of the above and works with the patient regarding what the patient feels is reasonable limitations I do not think there will be a problem

## 2023-03-24 DIAGNOSIS — M6281 Muscle weakness (generalized): Secondary | ICD-10-CM | POA: Diagnosis not present

## 2023-03-24 DIAGNOSIS — M25561 Pain in right knee: Secondary | ICD-10-CM | POA: Diagnosis not present

## 2023-03-24 DIAGNOSIS — R269 Unspecified abnormalities of gait and mobility: Secondary | ICD-10-CM | POA: Diagnosis not present

## 2023-03-24 DIAGNOSIS — M546 Pain in thoracic spine: Secondary | ICD-10-CM | POA: Diagnosis not present

## 2023-03-25 LAB — TOXASSURE SELECT 13 (MW), URINE

## 2023-03-25 NOTE — Telephone Encounter (Signed)
Returned Maddie's call and informed as follows  Stretching should be fine Gentle strength training only as patient tolerates her states is within her limits She has had significant back surgery several years ago that placed rods in the back which greatly limits her range of motion and has caused her to be fairly weak I think as long as the the physical therapist works within the confines of the above and works with the patient regarding what the patient feels is reasonable limitations I do not think there will be a problem

## 2023-03-30 DIAGNOSIS — M546 Pain in thoracic spine: Secondary | ICD-10-CM | POA: Diagnosis not present

## 2023-03-30 DIAGNOSIS — M6281 Muscle weakness (generalized): Secondary | ICD-10-CM | POA: Diagnosis not present

## 2023-03-30 DIAGNOSIS — M25561 Pain in right knee: Secondary | ICD-10-CM | POA: Diagnosis not present

## 2023-03-30 DIAGNOSIS — R269 Unspecified abnormalities of gait and mobility: Secondary | ICD-10-CM | POA: Diagnosis not present

## 2023-04-01 DIAGNOSIS — R269 Unspecified abnormalities of gait and mobility: Secondary | ICD-10-CM | POA: Diagnosis not present

## 2023-04-01 DIAGNOSIS — M546 Pain in thoracic spine: Secondary | ICD-10-CM | POA: Diagnosis not present

## 2023-04-01 DIAGNOSIS — M6281 Muscle weakness (generalized): Secondary | ICD-10-CM | POA: Diagnosis not present

## 2023-04-01 DIAGNOSIS — M25561 Pain in right knee: Secondary | ICD-10-CM | POA: Diagnosis not present

## 2023-04-05 ENCOUNTER — Other Ambulatory Visit: Payer: Self-pay

## 2023-04-05 ENCOUNTER — Ambulatory Visit: Payer: Self-pay | Admitting: Family Medicine

## 2023-04-05 MED ORDER — AMOXICILLIN-POT CLAVULANATE 875-125 MG PO TABS
1.0000 | ORAL_TABLET | Freq: Two times a day (BID) | ORAL | 0 refills | Status: DC
Start: 2023-04-05 — End: 2023-05-19

## 2023-04-05 NOTE — Telephone Encounter (Signed)
Nurses There are multiple options regarding this situation 1.  She has an appointment with me tomorrow She can keep this appointment We can send in Augmentin 875 1 twice daily for 7 days this is the standard of care for cat related injury  If she does not want to do the antibiotics currently I recommend washing with soapy water patting dry then we will see her tomorrow morning

## 2023-04-05 NOTE — Telephone Encounter (Signed)
Called and spoke with pt, she will be keeping appt for tomorrow, RX for Augmentin sent in as pt would like [place] ahead and get started on them

## 2023-04-05 NOTE — Telephone Encounter (Signed)
Copied from CRM [id number]. Topic: Clinical - Red Word Triage >> Apr 05, 2023  9:35 AM Jenna Contreras wrote: Red Word that prompted transfer to Nurse Triage: Patient states she had a freak accident on Saturday night. States she has blood vessels that sticks up about an inch on the back of her hand & she has a cat that ripped the vessel open about a quarter of an inch. She stated she got it to clot but she's concerned because it's a cat claw & they carry 11 different diseases. 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 Went to ER last night & they told her they would only give her one dose of antibiotic. Patient is wanting to see Dr. Gerda Diss for antibiotics.  Chief Complaint: cut/laceration Symptoms: laceration of skin and blood vessel Frequency: Saturday Pertinent Negatives: Patient denies active bleeding Disposition: [] ED /[] Urgent Care (no appt availability in office) / [x] Appointment(In office/virtual)/ []  [facility]/ [] Home Care/ [] Refused Recommended Disposition /[] [place] [facility]/ []  Follow-up with PCP Additional Notes:  cat scratched hand ripped blood vessel.  Pt is requesting ABX to prevent infection.  Reason for Disposition  [1] Looks infected (spreading redness, pus) AND [2] no fever    Pt concerned of possible infection due to laceration was from cat  Answer Assessment - Initial Assessment Questions 1. APPEARANCE of INJURY: "What does the injury look like?"      Cellular granulation per patient 2. SIZE: "How large is the cut?"      Quarter of inch 3. BLEEDING: "Is it bleeding now?" If Yes, ask: "Is it difficult to stop?"      Not bleeding none- pt stated it was a blood bath 4. LOCATION: "Where is the injury located?"      Back left hand 5. ONSET: "How long ago did the injury occur?"      Saturday 6. MECHANISM: "Tell me how it happened."      Cat scratched hand and ripped blood vessel  7. TETANUS: "When was the last tetanus booster?"      N/a 8. PREGNANCY: "Is there any chance you are pregnant?" "When was your last menstrual period?"     N/a  Protocols used: Cuts and Lacerations-A-AH

## 2023-04-06 ENCOUNTER — Ambulatory Visit (INDEPENDENT_AMBULATORY_CARE_PROVIDER_SITE_OTHER): Payer: Medicare Other | Admitting: Family Medicine

## 2023-04-06 VITALS — BP 130/78 | HR 66 | Temp 98.1°F | Ht 61.0 in | Wt 120.4 lb

## 2023-04-06 DIAGNOSIS — S61432A Puncture wound without foreign body of left hand, initial encounter: Secondary | ICD-10-CM | POA: Diagnosis not present

## 2023-04-06 DIAGNOSIS — M546 Pain in thoracic spine: Secondary | ICD-10-CM | POA: Diagnosis not present

## 2023-04-06 DIAGNOSIS — M25561 Pain in right knee: Secondary | ICD-10-CM | POA: Diagnosis not present

## 2023-04-06 DIAGNOSIS — R269 Unspecified abnormalities of gait and mobility: Secondary | ICD-10-CM | POA: Diagnosis not present

## 2023-04-06 DIAGNOSIS — M6281 Muscle weakness (generalized): Secondary | ICD-10-CM | POA: Diagnosis not present

## 2023-04-06 DIAGNOSIS — T148XXA Other injury of unspecified body region, initial encounter: Secondary | ICD-10-CM

## 2023-04-06 NOTE — Progress Notes (Signed)
   Subjective:    Patient ID: Jenna Contreras, female    DOB: 10-08-1945, 78 y.o.   MRN: [id number]  HPI Patient states she sustained a puncture wound on the back of her hand from her cat she thinks it was from the claw she relates pain and discomfort with a lot of bleeding the bleeding is stopped but she had a lot of soreness and some redness around it.  On further discussion patient states that it was not a bite cat is up-to-date on all immunizations This injury occurred earlier yesterday  Review of Systems     Objective:   Physical Exam On the back of the left hand there is a hematoma along with a puncture wound that is healing a little small amount of redness no obvious secondary findings       Assessment & Plan:   Puncture wound Bruising with some minimal redness Warning signs what to watch for discussed Patient is concerned about the possibility of additional diseases that could occur from the Very unlikely Reassurance given Augmentin twice daily for 5 days If ongoing troubles or problems let us know

## 2023-04-08 DIAGNOSIS — R269 Unspecified abnormalities of gait and mobility: Secondary | ICD-10-CM | POA: Diagnosis not present

## 2023-04-08 DIAGNOSIS — M546 Pain in thoracic spine: Secondary | ICD-10-CM | POA: Diagnosis not present

## 2023-04-08 DIAGNOSIS — M6281 Muscle weakness (generalized): Secondary | ICD-10-CM | POA: Diagnosis not present

## 2023-04-08 DIAGNOSIS — M25561 Pain in right knee: Secondary | ICD-10-CM | POA: Diagnosis not present

## 2023-04-13 DIAGNOSIS — M25561 Pain in right knee: Secondary | ICD-10-CM | POA: Diagnosis not present

## 2023-04-13 DIAGNOSIS — M6281 Muscle weakness (generalized): Secondary | ICD-10-CM | POA: Diagnosis not present

## 2023-04-13 DIAGNOSIS — R269 Unspecified abnormalities of gait and mobility: Secondary | ICD-10-CM | POA: Diagnosis not present

## 2023-04-13 DIAGNOSIS — M546 Pain in thoracic spine: Secondary | ICD-10-CM | POA: Diagnosis not present

## 2023-04-15 DIAGNOSIS — R269 Unspecified abnormalities of gait and mobility: Secondary | ICD-10-CM | POA: Diagnosis not present

## 2023-04-15 DIAGNOSIS — M546 Pain in thoracic spine: Secondary | ICD-10-CM | POA: Diagnosis not present

## 2023-04-15 DIAGNOSIS — M25561 Pain in right knee: Secondary | ICD-10-CM | POA: Diagnosis not present

## 2023-04-15 DIAGNOSIS — M6281 Muscle weakness (generalized): Secondary | ICD-10-CM | POA: Diagnosis not present

## 2023-04-20 DIAGNOSIS — M6281 Muscle weakness (generalized): Secondary | ICD-10-CM | POA: Diagnosis not present

## 2023-04-20 DIAGNOSIS — R269 Unspecified abnormalities of gait and mobility: Secondary | ICD-10-CM | POA: Diagnosis not present

## 2023-04-20 DIAGNOSIS — M25561 Pain in right knee: Secondary | ICD-10-CM | POA: Diagnosis not present

## 2023-04-20 DIAGNOSIS — M546 Pain in thoracic spine: Secondary | ICD-10-CM | POA: Diagnosis not present

## 2023-04-29 DIAGNOSIS — M546 Pain in thoracic spine: Secondary | ICD-10-CM | POA: Diagnosis not present

## 2023-04-29 DIAGNOSIS — R269 Unspecified abnormalities of gait and mobility: Secondary | ICD-10-CM | POA: Diagnosis not present

## 2023-04-29 DIAGNOSIS — M6281 Muscle weakness (generalized): Secondary | ICD-10-CM | POA: Diagnosis not present

## 2023-04-29 DIAGNOSIS — M25561 Pain in right knee: Secondary | ICD-10-CM | POA: Diagnosis not present

## 2023-05-04 DIAGNOSIS — M6281 Muscle weakness (generalized): Secondary | ICD-10-CM | POA: Diagnosis not present

## 2023-05-04 DIAGNOSIS — M546 Pain in thoracic spine: Secondary | ICD-10-CM | POA: Diagnosis not present

## 2023-05-04 DIAGNOSIS — R269 Unspecified abnormalities of gait and mobility: Secondary | ICD-10-CM | POA: Diagnosis not present

## 2023-05-04 DIAGNOSIS — M25561 Pain in right knee: Secondary | ICD-10-CM | POA: Diagnosis not present

## 2023-05-06 DIAGNOSIS — M25561 Pain in right knee: Secondary | ICD-10-CM | POA: Diagnosis not present

## 2023-05-06 DIAGNOSIS — M546 Pain in thoracic spine: Secondary | ICD-10-CM | POA: Diagnosis not present

## 2023-05-06 DIAGNOSIS — R269 Unspecified abnormalities of gait and mobility: Secondary | ICD-10-CM | POA: Diagnosis not present

## 2023-05-06 DIAGNOSIS — M6281 Muscle weakness (generalized): Secondary | ICD-10-CM | POA: Diagnosis not present

## 2023-05-11 DIAGNOSIS — R269 Unspecified abnormalities of gait and mobility: Secondary | ICD-10-CM | POA: Diagnosis not present

## 2023-05-11 DIAGNOSIS — M6281 Muscle weakness (generalized): Secondary | ICD-10-CM | POA: Diagnosis not present

## 2023-05-11 DIAGNOSIS — M546 Pain in thoracic spine: Secondary | ICD-10-CM | POA: Diagnosis not present

## 2023-05-11 DIAGNOSIS — M25561 Pain in right knee: Secondary | ICD-10-CM | POA: Diagnosis not present

## 2023-05-13 ENCOUNTER — Other Ambulatory Visit: Payer: Self-pay | Admitting: Family Medicine

## 2023-05-13 DIAGNOSIS — M6281 Muscle weakness (generalized): Secondary | ICD-10-CM | POA: Diagnosis not present

## 2023-05-13 DIAGNOSIS — M25561 Pain in right knee: Secondary | ICD-10-CM | POA: Diagnosis not present

## 2023-05-13 DIAGNOSIS — M546 Pain in thoracic spine: Secondary | ICD-10-CM | POA: Diagnosis not present

## 2023-05-13 DIAGNOSIS — R269 Unspecified abnormalities of gait and mobility: Secondary | ICD-10-CM | POA: Diagnosis not present

## 2023-05-19 ENCOUNTER — Ambulatory Visit
Admission: EM | Admit: 2023-05-19 | Discharge: 2023-05-19 | Disposition: A | Discharge status: Home / Self Care | Attending: Family Medicine | Admitting: Family Medicine

## 2023-05-19 ENCOUNTER — Telehealth: Payer: Self-pay | Admitting: Emergency Medicine

## 2023-05-19 ENCOUNTER — Ambulatory Visit: Payer: Self-pay | Admitting: Family Medicine

## 2023-05-19 ENCOUNTER — Ambulatory Visit (HOSPITAL_COMMUNITY)
Admission: RE | Admit: 2023-05-19 | Discharge: 2023-05-19 | Disposition: A | Discharge status: Home / Self Care | Source: Ambulatory Visit | Attending: Family Medicine | Admitting: Family Medicine

## 2023-05-19 ENCOUNTER — Encounter: Payer: Self-pay | Admitting: Emergency Medicine

## 2023-05-19 DIAGNOSIS — M79661 Pain in right lower leg: Secondary | ICD-10-CM | POA: Diagnosis present

## 2023-05-19 NOTE — ED Triage Notes (Addendum)
 Pain on lateral side of right upper lower leg since yesterday.  States hurts to walk.

## 2023-05-19 NOTE — Discharge Instructions (Addendum)
 Go directly to the [facility] outpatient imaging center for your DVT ultrasound of your lower leg.  If this is negative, ice, compression, elevation and follow-up with primary care.  Otherwise we will discuss a treatment plan if positive.

## 2023-05-19 NOTE — Telephone Encounter (Signed)
 Noted- patient evaluated at urgent care

## 2023-05-19 NOTE — Telephone Encounter (Signed)
 Chief Complaint: Leg pain  Symptoms: 9/10 right leg pain, leg swelling,  Frequency: Constant  Pertinent Negatives: Patient denies fever, redness, SOB, chest pain  Disposition: [] ED /[x] Urgent Care (no appt availability in office) / [] Appointment(In office/virtual)/ []  [facility]/ [] Home Care/ [] Refused Recommended Disposition /[] [facility]/ []  Follow-up with PCP Additional Notes: Patient states she developed right leg pain and swelling. Patient states the pain is near her knee on the side of the calf. Patient states she takes oxycodone for pain and it has not improved her discomfort. Patient is concerned it may be a blood clot. Care advice was given and patient stated she is agreeable to seek care at [facility] [place] due to no appointment availability on PCP office today.  Copied from CRM [id number]. Topic: Clinical - Red Word Triage >> May 19, 2023 11:05 AM Ivette P wrote: [facility] that prompted transfer to Nurse Triage: Pt believe she has a blood clot in her leg. Reason for Disposition  [1] Thigh, calf, or ankle swelling AND [2] only 1 side  Answer Assessment - Initial Assessment Questions 1. ONSET: "When did the pain start?"      Last night  2. LOCATION: "Where is the pain located?"      Right leg near the knee  3. PAIN: "How bad is the pain?"    (Scale 1-10; or mild, moderate, severe)   -  MILD (1-3): doesn't interfere with normal activities    -  MODERATE (4-7): interferes with normal activities (e.g., work or school) or awakens from sleep, limping    -  SEVERE (8-10): excruciating pain, unable to do any normal activities, unable to walk     9/10 4. WORK OR EXERCISE: "Has there been any recent work or exercise that involved this part of the body?"      I'm in PT for 3 months  5. CAUSE: "What do you think is causing the leg pain?"     I think I have a clot  6. OTHER SYMPTOMS: "Do you have any other symptoms?" (e.g., chest pain, back pain,  breathing difficulty, swelling, rash, fever, numbness, weakness)     Swelling in the leg  Protocols used: Leg Pain-A-AH

## 2023-05-19 NOTE — Telephone Encounter (Signed)
 Contacted patient about negative U/S results.  Patient advised to follow up with PCP if symptoms do not resolve.  Patient verbalized understanding.

## 2023-05-20 NOTE — Telephone Encounter (Signed)
 Patient has follow-up office visit coming in several days we recommend in person evaluation-FYI

## 2023-05-21 DIAGNOSIS — M546 Pain in thoracic spine: Secondary | ICD-10-CM | POA: Diagnosis not present

## 2023-05-21 DIAGNOSIS — M6281 Muscle weakness (generalized): Secondary | ICD-10-CM | POA: Diagnosis not present

## 2023-05-21 DIAGNOSIS — R269 Unspecified abnormalities of gait and mobility: Secondary | ICD-10-CM | POA: Diagnosis not present

## 2023-05-21 DIAGNOSIS — M25561 Pain in right knee: Secondary | ICD-10-CM | POA: Diagnosis not present

## 2023-05-23 NOTE — ED Provider Notes (Signed)
 [facility]    CSN: [id number] Arrival date & time: 05/19/23  1406      History   Chief Complaint No chief complaint on file.   HPI Jenna Contreras is a 78 y.o. female.   Presenting today with 1 day history of right calf pain worse with weight bearing. Denies significant swelling, redness, numbness, tingling, injury to the area. Hx of arthritis, no known hx of DVT/PE. So far trying otc remedies with minimal relief.Denies recent surgeries, smoking history, chest tightness, SOB, palpitations.     Past Medical History:  Diagnosis Date   Anxiety    Arthritis    Arthritis    Asthma    Bipolar 2 disorder (HCC)    Cancer (HCC)    Basal Cell   Constipation    Depression    Fibromyalgia    GERD (gastroesophageal reflux disease)    Heart murmur    As small child    History of gout    History of skin cancer    HNP (herniated nucleus pulposus with myelopathy), thoracic    Hypothyroidism    Left eye injury    In ED 06/17/2014    Low BP    Migraines    OCD (obsessive compulsive disorder)    Osteoporosis    Psoriasis    Scoliosis     Patient Active Problem List   Diagnosis Date Noted   Allergic rhinitis 05/11/2022   Major depressive disorder with single episode, in full remission (HCC) 03/18/2022   Neuropathic pain of both legs 12/24/2021   Coronary artery disease involving native heart without angina pectoris 01/31/2020   Dyspepsia    FH: colon cancer 09/18/2015   Osteoporosis 05/20/2015   Status post unilateral knee replacement 07/02/2014   Asthma, mild intermittent 02/06/2014   Hyperlipidemia 01/18/2014   S/P right THA, AA 05/09/2013   Encounter for pain management 05/07/2013   Osteoarthritis of right hip 02/15/2013   Chronic back pain 08/17/2012   Other specified hypothyroidism 08/17/2012   TIA (transient ischemic attack) 08/05/2011   Pulmonary nodule 08/05/2011    Past Surgical History:  Procedure Laterality Date   Arthroscopic left knee  surgery     BIOPSY  10/28/2017   Procedure: BIOPSY;  Surgeon: West Bali, MD;  Location: [facility];  Service: Endoscopy;;  duodenal biopsy , gastric biopsy    DILATION AND CURETTAGE OF UTERUS  x3   ESOPHAGOGASTRODUODENOSCOPY (EGD) WITH PROPOFOL N/A 10/28/2017   Procedure: ESOPHAGOGASTRODUODENOSCOPY (EGD) WITH PROPOFOL;  Surgeon: West Bali, MD;  Location: [facility];  Service: Endoscopy;  Laterality: N/A;  8:15am   HIP SURGERY Right 2015   iliac wings     KNEE SURGERY Bilateral 2015, 2016   MANDIBLE SURGERY     X 2   PARTIAL KNEE ARTHROPLASTY Left 02/12/2014   Procedure: LEFT KNEE UNICOMPARTMENTAL MEDIALLY ARTHROPLASTY ;  Surgeon: Shelda Pal, MD;  Location: [facility];  Service: Orthopedics;  Laterality: Left;   PARTIAL KNEE ARTHROPLASTY Right 07/02/2014   Procedure: RIGHT UNI KNEE ARTHOPLASTY MEDIALLY;  Surgeon: Durene Romans, MD;  Location: [facility];  Service: Orthopedics;  Laterality: Right;   rod in spine     scoliosis    TONSILLECTOMY     TOTAL HIP ARTHROPLASTY Right 05/09/2013   Procedure: RIGHT TOTAL HIP ARTHROPLASTY ANTERIOR APPROACH;  Surgeon: Shelda Pal, MD;  Location: [facility];  Service: Orthopedics;  Laterality: Right;   TUBAL LIGATION     WRIST SURGERY  OB History   No obstetric history on file.      Home Medications    Prior to Admission medications   Medication Sig Start Date End Date Taking? Authorizing Provider  albuterol [facility] HFA) 108 (90 Base) MCG/ACT inhaler USE 2 INHALATIONS EVERY 4 HOURS AS NEEDED 07/18/20   Glenford Bayley, NP  albuterol (PROVENTIL) (2.5 MG/3ML) 0.083% nebulizer solution Take 3 mLs (2.5 mg total) by nebulization every 6 (six) hours as needed for wheezing or shortness of breath. 05/14/20   Glenford Bayley, NP  chlorpheniramine (CHLOR-TRIMETON) 4 MG tablet Take 1 tablet (4 mg total) by mouth every 4 (four) hours as needed for allergies (Cough). 03/27/21   Glenford Bayley, NP  Cholecalciferol (VITAMIN D) 125 MCG (5000 UT)  CAPS Take by mouth.    [provider]  cyanocobalamin (VITAMIN B12) 1000 MCG tablet Take 1,000 mcg by mouth daily.    [provider]  hydrOXYzine (ATARAX) 25 MG tablet Take 1 tablet (25 mg total) by mouth every 4 (four) hours as needed for anxiety or itching. 07/03/22   Babs Sciara, MD  hydrOXYzine (VISTARIL) 25 MG capsule Take 1 capsule (25 mg total) by mouth every 6 (six) hours as needed. Take 1 capsule (25 mg total) by mouth every 6 (six) hour as needed for hives. 10/19/22   Babs Sciara, MD  levothyroxine (SYNTHROID) 75 MCG tablet Take one tablet po daily 02/19/23   Babs Sciara, MD  magnesium oxide (MAG-OX) 400 (240 Mg) MG tablet Take 400 mg by mouth daily.    [provider]  NONFORMULARY OR COMPOUNDED ITEM Apply topically. 12/10/20   [provider]  ondansetron (ZOFRAN) 4 MG tablet Take 1 tablet (4 mg total) by mouth every 6 (six) hours as needed for nausea or vomiting. 02/28/23   Burgess Amor, PA-C  OVER THE COUNTER MEDICATION [place] and dulcolax    [provider]  oxyCODONE-acetaminophen (PERCOCET) 10-325 MG tablet TAKE ONE TABLET BY MOUTH EVERY EIGHT HOURS AS NEEDED FOR PAIN 05/15/23   Babs Sciara, MD  Polyethyl Glycol-Propyl Glycol (SYSTANE ULTRA OP) Place 1 drop into both eyes at bedtime.    [provider]  rizatriptan (MAXALT) 10 MG tablet TAKE AS DIRECTED, MAY REPEAT IN 2 HOURS IF NEEDED 08/27/22   Babs Sciara, MD  triamcinolone acetonide (TRIESENCE) 40 MG/ML SUSP Inject into the articular space. Patient not taking: Reported on 02/19/2023 04/29/21   [provider]  trolamine salicylate (ASPERCREME) 10 % cream Apply 1 application topically as needed for muscle pain.    [provider]    Family History Family History  Problem Relation Age of Onset   Heart attack Mother 63       MULTIPLE   Heart disease Mother 5   Diabetes Mother    Colon cancer Father 53   Heart disease Brother 5     Social History Social History   Tobacco Use   Smoking status: Former    Current packs/day: 0.00    Average packs/day: 2.0 packs/day for 19.0 years (38.0 ttl pk-yrs)    Types: Cigarettes    Start date: 03/09/1960    Quit date: 03/10/1979    Years since quitting: 44.2    Passive exposure: Past   Smokeless tobacco: Never   Tobacco comments:    Quit in 1981  Vaping Use   Vaping status: Never Used  Substance Use Topics   Alcohol use: No    Alcohol/week: 0.0 standard  drinks of alcohol    Comment: no   Drug use: No     Allergies   Betadine [povidone-iodine], Prednisone, Silvadene [silver sulfadiazine], Sulfa antibiotics, Latex, Wound dressing adhesive, Adhesive [tape], Betadine [povidone iodine], Lamictal [lamotrigine], Levaquin [levofloxacin in d5w], Tizanidine, Trazodone and nefazodone, Zyrtec [cetirizine], Baclofen, Benadryl [diphenhydramine], Cephalexin, Claritin [loratadine], Demerol [meperidine], Doxycycline, Erythromycin, Keflex [cephalexin], Other, and Tetracyclines & related   Review of Systems Review of Systems PER HPI  Physical Exam Triage Vital Signs ED Triage Vitals  Encounter Vitals Group     BP 05/19/23 1415 118/63     Systolic BP Percentile --      Diastolic BP Percentile --      Pulse Rate 05/19/23 1415 71     Resp 05/19/23 1415 18     Temp 05/19/23 1415 97.8 F (36.6 C)     Temp Source 05/19/23 1415 Oral     SpO2 05/19/23 1415 97 %     Weight --      Height --      Head Circumference --      Peak Flow --      Pain Score 05/19/23 1416 9     Pain Loc --      Pain Education --      Exclude from Growth Chart --    No data found.  Updated Vital Signs BP 118/63 (BP Location: Right Arm)   Pulse 71   Temp 97.8 F (36.6 C) (Oral)   Resp 18   SpO2 97%   Visual Acuity Right Eye Distance:   Left Eye Distance:   Bilateral Distance:    Right Eye Near:   Left Eye Near:    Bilateral Near:     Physical Exam Vitals and nursing note reviewed.   Constitutional:      Appearance: Normal appearance. She is not ill-appearing.  HENT:     Head: Atraumatic.  Eyes:     Extraocular Movements: Extraocular movements intact.     Conjunctiva/sclera: Conjunctivae normal.  Cardiovascular:     Rate and Rhythm: Normal rate.  Pulmonary:     Effort: Pulmonary effort is normal.     Breath sounds: Normal breath sounds.  Musculoskeletal:        General: Tenderness present. No swelling or signs of injury. Normal range of motion.     Cervical back: Normal range of motion and neck supple.     Comments: Negative homans sign or squeeze test right calf. TTP just below and medial to right knee with no palpable masses or appreciable edema  Skin:    General: Skin is warm and dry.     Findings: No bruising or erythema.  Neurological:     Mental Status: She is alert and oriented to person, place, and time.     Comments: RLE neurovascularly intact  Psychiatric:        Mood and Affect: Mood normal.        Thought Content: Thought content normal.        Judgment: Judgment normal.      UC Treatments / Results  Labs (all labs ordered are listed, but only abnormal results are displayed) Labs Reviewed - No data to display  EKG   Radiology No results found.  Procedures Procedures (including critical care time)  Medications Ordered in UC Medications - No data to display  Initial Impression / Assessment and Plan / UC Course  I have reviewed the triage vital signs and the nursing notes.  Pertinent  labs & imaging results that were available during my care of the patient were reviewed by me and considered in my medical decision making (see chart for details).     Possibly muscular in nature, but will obtain DVT rule out ultrasound for reassurance. RICE protocol and OTC pain relievers reviewed. Follow up with PCP for recheck  Final Clinical Impressions(s) / UC Diagnoses   Final diagnoses:  Right calf pain     Discharge Instructions       Go directly to the [place] outpatient imaging center for your DVT ultrasound of your lower leg.  If this is negative, ice, compression, elevation and follow-up with primary care.  Otherwise we will discuss a treatment plan if positive.    ED Prescriptions   None    PDMP not reviewed this encounter.   Particia Nearing, [place] 05/23/23 2304

## 2023-05-24 DIAGNOSIS — M546 Pain in thoracic spine: Secondary | ICD-10-CM | POA: Diagnosis not present

## 2023-05-24 DIAGNOSIS — M6281 Muscle weakness (generalized): Secondary | ICD-10-CM | POA: Diagnosis not present

## 2023-05-24 DIAGNOSIS — M25561 Pain in right knee: Secondary | ICD-10-CM | POA: Diagnosis not present

## 2023-05-24 DIAGNOSIS — R269 Unspecified abnormalities of gait and mobility: Secondary | ICD-10-CM | POA: Diagnosis not present

## 2023-05-26 ENCOUNTER — Encounter: Payer: Self-pay | Admitting: Family Medicine

## 2023-05-26 ENCOUNTER — Ambulatory Visit: Payer: Medicare Other | Admitting: Family Medicine

## 2023-05-26 VITALS — BP 108/68 | HR 58 | Temp 97.3°F | Ht 61.0 in | Wt 118.0 lb

## 2023-05-26 DIAGNOSIS — G8929 Other chronic pain: Secondary | ICD-10-CM

## 2023-05-26 DIAGNOSIS — E039 Hypothyroidism, unspecified: Secondary | ICD-10-CM | POA: Diagnosis not present

## 2023-05-26 DIAGNOSIS — M8580 Other specified disorders of bone density and structure, unspecified site: Secondary | ICD-10-CM

## 2023-05-26 DIAGNOSIS — M546 Pain in thoracic spine: Secondary | ICD-10-CM

## 2023-05-26 DIAGNOSIS — Z79891 Long term (current) use of opiate analgesic: Secondary | ICD-10-CM | POA: Diagnosis not present

## 2023-05-26 MED ORDER — OXYCODONE-ACETAMINOPHEN 10-325 MG PO TABS: ORAL_TABLET | ORAL | 0 refills | Status: DC

## 2023-05-26 MED ORDER — LEVOTHYROXINE SODIUM 75 MCG PO TABS: ORAL_TABLET | ORAL | 3 refills | Status: DC

## 2023-05-26 MED ORDER — RIZATRIPTAN BENZOATE 10 MG PO TABS: ORAL_TABLET | ORAL | 3 refills | Status: AC

## 2023-05-26 NOTE — Progress Notes (Signed)
 Subjective:    Patient ID: Jenna Contreras, female    DOB: 10-Oct-1945, 78 y.o.   MRN: [id number]  Discussed the use of AI scribe software for clinical note transcription with the patient, who gave verbal consent to proceed. This patient was seen today for chronic pain  The medication list was reviewed and updated.   Location of Pain for which the patient has been treated with regarding narcotics: Lumbar and thoracic  Onset of this pain: Present for years   -Compliance with medication: Good compliance  - Number patient states they take daily: Some days takes 0 some days 1 some days 2  -Reason for ongoing use of opioids aching throbbing  What other measures have been tried outside of opioids Tylenol, NSAIDs, surgery  In the ongoing specialists regarding this condition back specialist previously  -when was the last dose patient took?  Yesterday  The patient was advised the importance of maintaining medication and not using illegal substances with these.  Here for refills and follow up  The patient was educated that we can provide 3 monthly scripts for their medication, it is their responsibility to follow the instructions.  Side effects or complications from medications: Denies side effects  Patient is aware that pain medications are meant to minimize the severity of the pain to allow their pain levels to improve to allow for better function. They are aware of that pain medications cannot totally remove their pain.  Due for UDT ( at least once per year) (pain management contract is also completed at the time of the UDT): January 2025  Scale of 1 to 10 ( 1 is least 10 is most) Your pain level without the medicine: 9 Your pain level with medication 6  Scale 1 to 10 ( 1-helps very little, 10 helps very well) How well does your pain medication reduce your pain so you can function better through out the day?  8  Quality of the pain: Throbbing aching  Persistence of the  pain: Present all the time  Modifying factors: Worse with activity      History of Present Illness   Jenna Contreras is a 78 year old female who presents for pain management and physical therapy follow-up.  She experiences significant pain starting around 7 PM, often requiring oxycodone for relief. She takes oxycodone 10 mg/325 mg as needed, sometimes once or twice a day, but tries to avoid it due to concerns about liver health. She has been on hydrocodone for 25-30 years and is cautious about liver function, which was last checked in January and was normal. She sometimes goes two to three days without taking any pain medication.  She attends physical therapy sessions twice a week, primarily for knee issues, but also receives treatment for her back. She has undergone multiple back surgeries, leading to muscle atrophy. The physical therapist performs deep tissue massage and stretches, which have been beneficial. She works on hamstring stretches and isometric exercises, which have shown better results for her knee pain.  She recently experienced excruciating pain in her calf after standing for five hours, leading to an ER visit. An ultrasound ruled out a blood clot, and the pain has since resolved. She has a history of lymphedema affecting her thighs and knees, but not her calves or feet.  She takes 5000 IU of vitamin D daily but plans to adjust her intake to 3000 IU while she works through the 5000 unit vit d she will be  skipping doses on  certain days due to high levels.  She experiences migraines approximately once every two to three weeks and uses Maxalt for relief. While Maxalt alleviates the migraine pain, it leaves her feeling unwell for the rest of the day.  She has a history of osteopenia and is due for a bone density test. She also mentions a past incident where she fractured her ischial tuberosity while bodybuilding, contributing to her current hamstring pain.  She is considering  a mammogram but is hesitant about potential treatment options if abnormalities are found.         Review of Systems     Objective:    Physical Exam        General-in no acute distress Eyes-no discharge Lungs-respiratory rate normal, CTA CV-no murmurs,RRR Extremities skin warm dry no edema Neuro grossly normal Behavior normal, alert Limited range of motion of back due to previous surgery and rods       Assessment & Plan:  Assessment and Plan    Chronic Pain Chronic back and knee pain managed with oxycodone. Concern for liver health due to long-term hydrocodone use. Normal liver function tests in January. - Prescribed 40 tablets oxycodone 5 mg/325 mg PRN, refill based on usage. - Advised taking medication before severe pain. - Monitor liver function periodically.  Hamstring Pain Hamstring pain related to past injury, improved with isometric exercises. - Continue physical therapy with isometric exercises and stretches. - Advise avoiding prolonged standing and taking breaks.  Lymphedema Lymphedema in upper legs, managed with physical activity and diet. - Continue physical therapy and stretching. - Encourage low salt diet.  Osteopenia Osteopenia with pending bone density scan. - Schedule bone density scan.  Vitamin D Excess Vitamin D level over 100, risk of detoxicosis. - Reduce vitamin D intake to 3000 IU daily. - Reassess vitamin D levels.  Migraine Migraines managed with Maxalt, insurance may limit quantity. - Prescribe Maxalt via [facility].  General Health Maintenance Hesitant about mammogram due to potential treatment options. - Encourage mammogram, discuss benefits of early detection.  Goals of Care Prefers hospice care, focused on maintaining independence. - Discuss and document end-of-life care preferences.      1. Hypothyroidism, unspecified type (Primary) Continue medication Check labs periodically - levothyroxine (SYNTHROID) 75 MCG  tablet; Take one tablet po daily  Dispense: 90 tablet; Refill: 3  2. Encounter for long-term opiate analgesic use The patient was seen in followup for chronic pain. A review over at their current pain status was discussed. Drug registry was checked. Prescriptions were given.  Regular follow-up recommended. Discussion was held regarding the importance of compliance with medication as well as pain medication contract.  Patient was informed that medication may cause drowsiness and should not be combined  with other medications/alcohol or street drugs. If the patient feels medication is causing altered alertness then do not drive or operate dangerous equipment.  Should be noted that the patient appears to be meeting appropriate use of opioids and response.  Evidenced by improved function and decent pain control without significant side effects and no evidence of overt aberrancy issues.  Upon discussion with the patient today they understand that opioid therapy is optional and they feel that the pain has been refractory to reasonable conservative measures and is significant and affecting quality of life enough to warrant ongoing therapy and wishes to continue opioids.  Refills were provided.  [facility] medical Board guidelines regarding the pain medicine has been reviewed.  CDC guidelines most updated 2022 has been  reviewed by the prescriber.  PDMP is checked on a regular basis yearly urine drug screen and pain management contract  Treatment plan for this patient includes #1-gentle stretching exercises as shown daily basis 2.  Mild strength exercises 3 times per week #3 continue pain medications #4 notify us if any digression  After full discussion with patient we will try 40 tablets/month some days she takes 1 tablet some days 2 tablets some days 0 tablets  3. Chronic bilateral thoracic back pain Gentle stretching exercises range of motion recommended  4. Osteopenia, unspecified  location No treatment indicated currently other than vitamin D and calcium

## 2023-05-27 DIAGNOSIS — R269 Unspecified abnormalities of gait and mobility: Secondary | ICD-10-CM | POA: Diagnosis not present

## 2023-05-27 DIAGNOSIS — M6281 Muscle weakness (generalized): Secondary | ICD-10-CM | POA: Diagnosis not present

## 2023-05-27 DIAGNOSIS — M25561 Pain in right knee: Secondary | ICD-10-CM | POA: Diagnosis not present

## 2023-05-27 DIAGNOSIS — M546 Pain in thoracic spine: Secondary | ICD-10-CM | POA: Diagnosis not present

## 2023-05-31 DIAGNOSIS — M546 Pain in thoracic spine: Secondary | ICD-10-CM | POA: Diagnosis not present

## 2023-05-31 DIAGNOSIS — M6281 Muscle weakness (generalized): Secondary | ICD-10-CM | POA: Diagnosis not present

## 2023-05-31 DIAGNOSIS — M25561 Pain in right knee: Secondary | ICD-10-CM | POA: Diagnosis not present

## 2023-05-31 DIAGNOSIS — R269 Unspecified abnormalities of gait and mobility: Secondary | ICD-10-CM | POA: Diagnosis not present

## 2023-06-07 ENCOUNTER — Ambulatory Visit (INDEPENDENT_AMBULATORY_CARE_PROVIDER_SITE_OTHER)

## 2023-06-07 ENCOUNTER — Ambulatory Visit
Admission: EM | Admit: 2023-06-07 | Discharge: 2023-06-07 | Disposition: A | Discharge status: Home / Self Care | Attending: Family Medicine | Admitting: Family Medicine

## 2023-06-07 DIAGNOSIS — J069 Acute upper respiratory infection, unspecified: Secondary | ICD-10-CM | POA: Diagnosis not present

## 2023-06-07 DIAGNOSIS — J3089 Other allergic rhinitis: Secondary | ICD-10-CM

## 2023-06-07 DIAGNOSIS — R051 Acute cough: Secondary | ICD-10-CM | POA: Diagnosis not present

## 2023-06-07 DIAGNOSIS — J4541 Moderate persistent asthma with (acute) exacerbation: Secondary | ICD-10-CM | POA: Diagnosis not present

## 2023-06-07 DIAGNOSIS — M25561 Pain in right knee: Secondary | ICD-10-CM | POA: Diagnosis not present

## 2023-06-07 DIAGNOSIS — M6281 Muscle weakness (generalized): Secondary | ICD-10-CM | POA: Diagnosis not present

## 2023-06-07 DIAGNOSIS — R062 Wheezing: Secondary | ICD-10-CM | POA: Diagnosis not present

## 2023-06-07 DIAGNOSIS — R058 Other specified cough: Secondary | ICD-10-CM | POA: Diagnosis not present

## 2023-06-07 DIAGNOSIS — R0789 Other chest pain: Secondary | ICD-10-CM | POA: Diagnosis not present

## 2023-06-07 DIAGNOSIS — Z981 Arthrodesis status: Secondary | ICD-10-CM | POA: Diagnosis not present

## 2023-06-07 DIAGNOSIS — R269 Unspecified abnormalities of gait and mobility: Secondary | ICD-10-CM | POA: Diagnosis not present

## 2023-06-07 DIAGNOSIS — M546 Pain in thoracic spine: Secondary | ICD-10-CM | POA: Diagnosis not present

## 2023-06-07 MED ORDER — FLUTICASONE PROPIONATE HFA 110 MCG/ACT IN AERO: 2.0000 | INHALATION_SPRAY | Freq: Two times a day (BID) | RESPIRATORY_TRACT | 1 refills | Status: DC

## 2023-06-07 MED ORDER — ALBUTEROL SULFATE HFA 108 (90 BASE) MCG/ACT IN AERS: 2.0000 | INHALATION_SPRAY | RESPIRATORY_TRACT | 0 refills | Status: DC | PRN

## 2023-06-07 MED ORDER — PROMETHAZINE-DM 6.25-15 MG/5ML PO SYRP: 5.0000 mL | ORAL_SOLUTION | Freq: Four times a day (QID) | ORAL | 0 refills | Status: DC | PRN

## 2023-06-07 MED ORDER — PREDNISONE 20 MG PO TABS
20.0000 mg | ORAL_TABLET | Freq: Every day | ORAL | 0 refills | Status: DC
Start: 2023-06-07 — End: 2023-07-24

## 2023-06-07 NOTE — ED Triage Notes (Signed)
 Cough, scratchy throat, sneezing, congestion x 8 days.  Taking Claritin.

## 2023-06-07 NOTE — ED Provider Notes (Signed)
 [facility]    CSN: [id number] Arrival date & time: 06/07/23  1454      History   Chief Complaint Chief Complaint  Patient presents with   Cough   Nasal Congestion    HPI Jenna Contreras is a 78 y.o. female.   Patient presenting today with 8-day history of cough, scratchy throat, sneezing, congestion, and significant hacking cough, wheezing, chest tightness.  Denies chest pain, shortness of breath, fever, chills, abdominal pain, vomiting, diarrhea.  History of seasonal allergies on antihistamines intermittently, asthma on albuterol as needed for which she has used once or twice since onset of symptoms.  She states she has also been prescribed Flovent in the past that she is supposed to take when she gets sick but she forgot to restart it and notes that her Flovent is expired now.  She does have a history of frequent pneumonia and wants to make sure she does not have pneumonia.   Past Medical History:  Diagnosis Date   Anxiety    Arthritis    Arthritis    Asthma    Bipolar 2 disorder (HCC)    Cancer (HCC)    Basal Cell   Constipation    Depression    Fibromyalgia    GERD (gastroesophageal reflux disease)    Heart murmur    As small child    History of gout    History of skin cancer    HNP (herniated nucleus pulposus with myelopathy), thoracic    Hypothyroidism    Left eye injury    In ED 06/17/2014    Low BP    Migraines    OCD (obsessive compulsive disorder)    Osteoporosis    Psoriasis    Scoliosis     Patient Active Problem List   Diagnosis Date Noted   Allergic rhinitis 05/11/2022   Major depressive disorder with single episode, in full remission (HCC) 03/18/2022   Neuropathic pain of both legs 12/24/2021   Coronary artery disease involving native heart without angina pectoris 01/31/2020   Dyspepsia    FH: colon cancer 09/18/2015   Osteoporosis 05/20/2015   Status post unilateral knee replacement 07/02/2014   Asthma, mild intermittent  02/06/2014   Hyperlipidemia 01/18/2014   S/P right THA, AA 05/09/2013   Encounter for pain management 05/07/2013   Osteoarthritis of right hip 02/15/2013   Chronic back pain 08/17/2012   Other specified hypothyroidism 08/17/2012   TIA (transient ischemic attack) 08/05/2011   Pulmonary nodule 08/05/2011    Past Surgical History:  Procedure Laterality Date   Arthroscopic left knee surgery     BIOPSY  10/28/2017   Procedure: BIOPSY;  Surgeon: West Bali, MD;  [facility]: [facility];  Service: Endoscopy;;  duodenal biopsy , gastric biopsy    DILATION AND CURETTAGE OF UTERUS  x3   ESOPHAGOGASTRODUODENOSCOPY (EGD) WITH PROPOFOL N/A 10/28/2017   Procedure: ESOPHAGOGASTRODUODENOSCOPY (EGD) WITH PROPOFOL;  Surgeon: West Bali, MD;  Location: [facility];  Service: Endoscopy;  Laterality: N/A;  8:15am   HIP SURGERY Right 2015   iliac wings     KNEE SURGERY Bilateral 2015, 2016   MANDIBLE SURGERY     X 2   PARTIAL KNEE ARTHROPLASTY Left 02/12/2014   Procedure: LEFT KNEE UNICOMPARTMENTAL MEDIALLY ARTHROPLASTY ;  Surgeon: Shelda Pal, MD;  Location: [facility];  Service: Orthopedics;  Laterality: Left;   PARTIAL KNEE ARTHROPLASTY Right 07/02/2014   Procedure: RIGHT UNI KNEE ARTHOPLASTY MEDIALLY;  Surgeon: Durene Romans, MD;  Location: [facility];  Service: Orthopedics;  Laterality: Right;   rod in spine     scoliosis    TONSILLECTOMY     TOTAL HIP ARTHROPLASTY Right 05/09/2013   Procedure: RIGHT TOTAL HIP ARTHROPLASTY ANTERIOR APPROACH;  Surgeon: Shelda Pal, MD;  Location: [facility];  Service: Orthopedics;  Laterality: Right;   TUBAL LIGATION     WRIST SURGERY      OB History   No obstetric history on file.      Home Medications    Prior to Admission medications   Medication Sig Start Date End Date Taking? Authorizing Provider  albuterol [facility] HFA) 108 (90 Base) MCG/ACT inhaler USE 2 INHALATIONS EVERY 4 HOURS AS NEEDED 07/18/20  Yes Glenford Bayley, NP  albuterol (VENTOLIN  HFA) 108 (90 Base) MCG/ACT inhaler Inhale 2 puffs into the lungs every 4 (four) hours as needed. 06/07/23  Yes Particia Nearing, PA-C  chlorpheniramine (CHLOR-TRIMETON) 4 MG tablet Take 1 tablet (4 mg total) by mouth every 4 (four) hours as needed for allergies (Cough). 03/27/21  Yes Glenford Bayley, NP  Cholecalciferol (VITAMIN D) 125 MCG (5000 UT) CAPS Take by mouth.   Yes [provider]  cyanocobalamin (VITAMIN B12) 1000 MCG tablet Take 1,000 mcg by mouth daily.   Yes [provider]  fluticasone (FLOVENT HFA) 110 MCG/ACT inhaler Inhale 2 puffs into the lungs 2 (two) times daily. 06/07/23  Yes Particia Nearing, PA-C  hydrOXYzine (VISTARIL) 25 MG capsule Take 1 capsule (25 mg total) by mouth every 6 (six) hours as needed. Take 1 capsule (25 mg total) by mouth every 6 (six) hour as needed for hives. 10/19/22  Yes Babs Sciara, MD  levothyroxine (SYNTHROID) 75 MCG tablet Take one tablet po daily 05/26/23  Yes Luking, Scott A, MD  magnesium oxide (MAG-OX) 400 (240 Mg) MG tablet Take 400 mg by mouth daily.   Yes [provider]  predniSONE (DELTASONE) 20 MG tablet Take 1 tablet (20 mg total) by mouth daily with breakfast. 06/07/23  Yes Particia Nearing, PA-C  promethazine-dextromethorphan (PROMETHAZINE-DM) 6.25-15 MG/5ML syrup Take 5 mLs by mouth 4 (four) times daily as needed. 06/07/23  Yes Particia Nearing, PA-C  albuterol (PROVENTIL) (2.5 MG/3ML) 0.083% nebulizer solution Take 3 mLs (2.5 mg total) by nebulization every 6 (six) hours as needed for wheezing or shortness of breath. 05/14/20   Glenford Bayley, NP  hydrOXYzine (ATARAX) 25 MG tablet Take 1 tablet (25 mg total) by mouth every 4 (four) hours as needed for anxiety or itching. 07/03/22   Babs Sciara, MD  NONFORMULARY OR COMPOUNDED ITEM Apply topically. 12/10/20   [provider]  ondansetron (ZOFRAN) 4 MG tablet Take 1 tablet (4 mg total) by mouth every 6 (six) hours as needed for  nausea or vomiting. 02/28/23   Idol, Raynelle Fanning, PA-C  OVER THE COUNTER MEDICATION [place] and dulcolax    [provider]  oxyCODONE-acetaminophen (PERCOCET) 10-325 MG tablet 1 bid prn pain 05/26/23   Babs Sciara, MD  oxyCODONE-acetaminophen (PERCOCET) 10-325 MG tablet 1 twice daily as needed pain 05/26/23   Babs Sciara, MD  oxyCODONE-acetaminophen (PERCOCET) 10-325 MG tablet 1 twice daily as needed pain 05/26/23   Babs Sciara, MD  Polyethyl Glycol-Propyl Glycol (SYSTANE ULTRA OP) Place 1 drop into both eyes at bedtime.    [provider]  rizatriptan (MAXALT) 10 MG tablet TAKE AS DIRECTED, MAY REPEAT IN 2 HOURS IF NEEDED 05/26/23   Luking,  Scott A, MD  triamcinolone acetonide (TRIESENCE) 40 MG/ML SUSP Inject into the articular space. 04/29/21   [provider]  trolamine salicylate (ASPERCREME) 10 % cream Apply 1 application topically as needed for muscle pain.    [provider]    Family History Family History  Problem Relation Age of Onset   Heart attack Mother 55       MULTIPLE   Heart disease Mother 44   Diabetes Mother    Colon cancer Father 41   Heart disease Brother 29    Social History Social History   Tobacco Use   Smoking status: Former    Current packs/day: 0.00    Average packs/day: 2.0 packs/day for 19.0 years (38.0 ttl pk-yrs)    Types: Cigarettes    Start date: 03/09/1960    Quit date: 03/10/1979    Years since quitting: 44.2    Passive exposure: Past   Smokeless tobacco: Never   Tobacco comments:    Quit in 1981  Vaping Use   Vaping status: Never Used  Substance Use Topics   Alcohol use: No    Alcohol/week: 0.0 standard drinks of alcohol    Comment: no   Drug use: No     Allergies   Betadine [povidone-iodine], Prednisone, Silvadene [silver sulfadiazine], Sulfa antibiotics, Latex, Wound dressing adhesive, Adhesive [tape], Betadine [povidone iodine], Lamictal [lamotrigine], Levaquin [levofloxacin in d5w], Tizanidine,  Trazodone and nefazodone, Zyrtec [cetirizine], Baclofen, Benadryl [diphenhydramine], Cephalexin, Claritin [loratadine], Demerol [meperidine], Doxycycline, Erythromycin, Keflex [cephalexin], Other, and Tetracyclines & related   Review of Systems Review of Systems Per HPI  Physical Exam Triage Vital Signs ED Triage Vitals  Encounter Vitals Group     BP 06/07/23 1611 113/75     Systolic BP Percentile --      Diastolic BP Percentile --      Pulse Rate 06/07/23 1611 76     Resp 06/07/23 1611 16     Temp 06/07/23 1611 98.5 F (36.9 C)     Temp Source 06/07/23 1611 Oral     SpO2 06/07/23 1611 95 %     Weight --      Height --      Head Circumference --      Peak Flow --      Pain Score 06/07/23 1612 0     Pain Loc --      Pain Education --      Exclude from Growth Chart --    No data found.  Updated Vital Signs BP 113/75 (BP Location: Right Arm)   Pulse 76   Temp 98.5 F (36.9 C) (Oral)   Resp 16   SpO2 95%   Visual Acuity Right Eye Distance:   Left Eye Distance:   Bilateral Distance:    Right Eye Near:   Left Eye Near:    Bilateral Near:     Physical Exam Vitals and nursing note reviewed.  Constitutional:      Appearance: Normal appearance.  HENT:     Head: Atraumatic.     Right Ear: Tympanic membrane and external ear normal.     Left Ear: Tympanic membrane and external ear normal.     Nose: Rhinorrhea present.     Mouth/Throat:     Mouth: Mucous membranes are moist.     Pharynx: Posterior oropharyngeal erythema present.  Eyes:     Extraocular Movements: Extraocular movements intact.     Conjunctiva/sclera: Conjunctivae normal.  Cardiovascular:     Rate and Rhythm:  Normal rate and regular rhythm.     Heart sounds: Normal heart sounds.  Pulmonary:     Effort: Pulmonary effort is normal.     Breath sounds: Wheezing present. No rales.  Musculoskeletal:        General: Normal range of motion.     Cervical back: Normal range of motion and neck supple.   Skin:    General: Skin is warm and dry.  Neurological:     Mental Status: She is alert and oriented to person, place, and time.  Psychiatric:        Mood and Affect: Mood normal.        Thought Content: Thought content normal.      UC Treatments / Results  Labs (all labs ordered are listed, but only abnormal results are displayed) Labs Reviewed - No data to display  EKG   Radiology DG Chest 2 View Result Date: 06/07/2023 CLINICAL DATA:  Productive cough, wheezing, chest tightness for 8 days EXAM: CHEST - 2 VIEW COMPARISON:  03/13/2021 FINDINGS: Frontal and lateral views of the chest demonstrate an unremarkable cardiac silhouette. No acute airspace disease, effusion, or pneumothorax. Stable postsurgical changes from thoracolumbar posterior spinal fusion. No acute bony abnormalities. IMPRESSION: 1. No acute intrathoracic process. Electronically Signed   By: Sharlet Salina M.D.   On: 06/07/2023 17:23    Procedures Procedures (including critical care time)  Medications Ordered in UC Medications - No data to display  Initial Impression / Assessment and Plan / UC Course  I have reviewed the triage vital signs and the nursing notes.  Pertinent labs & imaging results that were available during my care of the patient were reviewed by me and considered in my medical decision making (see chart for details).     Suspect viral versus seasonal allergy symptoms causing an asthma exacerbation.  Chest x-ray today negative for pneumonia, vitals and exam overall reassuring with no red flag findings.  Will refill Flovent, albuterol, continue consistent allergy regimen and will also treat with Phenergan DM, very low-dose prednisone as she states higher doses caused suicidal ideation.  She states she tolerates low doses well but will monitor closely for side effects and discontinue if having any.  Return for worsening symptoms.  Final Clinical Impressions(s) / UC Diagnoses   Final diagnoses:   Acute cough  Seasonal allergic rhinitis due to other allergic trigger  Viral URI with cough  Moderate persistent asthma with acute exacerbation     Discharge Instructions      Your x-ray came back negative for pneumonia today.  I suspect either viral or allergic symptoms to be causing an asthma exacerbation.  I have refilled your Flovent and albuterol as well as given you a small course of prednisone and Phenergan DM.  Given your previous intolerances to higher doses of prednisone, do be on the look out for any side effects and stop immediately if having any and call right away.  Follow-up for any worsening symptoms.    ED Prescriptions     Medication Sig Dispense Auth. Provider   albuterol (VENTOLIN HFA) 108 (90 Base) MCG/ACT inhaler Inhale 2 puffs into the lungs every 4 (four) hours as needed. 18 g Particia Nearing, PA-C   fluticasone [facility]) 110 MCG/ACT inhaler Inhale 2 puffs into the lungs 2 (two) times daily. 1 each Particia Nearing, PA-C   promethazine-dextromethorphan (PROMETHAZINE-DM) 6.25-15 MG/5ML syrup Take 5 mLs by mouth 4 (four) times daily as needed. 100 mL Particia Nearing,  PA-C   predniSONE (DELTASONE) 20 MG tablet Take 1 tablet (20 mg total) by mouth daily with breakfast. 5 tablet Particia Nearing, [place]      PDMP not reviewed this encounter.   Particia Nearing, [place] 06/07/23 1818

## 2023-06-07 NOTE — Discharge Instructions (Signed)
 Your x-ray came back negative for pneumonia today.  I suspect either viral or allergic symptoms to be causing an asthma exacerbation.  I have refilled your Flovent and albuterol as well as given you a small course of prednisone and Phenergan DM.  Given your previous intolerances to higher doses of prednisone, do be on the look out for any side effects and stop immediately if having any and call right away.  Follow-up for any worsening symptoms.

## 2023-06-08 DIAGNOSIS — G8929 Other chronic pain: Secondary | ICD-10-CM | POA: Diagnosis not present

## 2023-06-08 DIAGNOSIS — Z96651 Presence of right artificial knee joint: Secondary | ICD-10-CM | POA: Diagnosis not present

## 2023-06-08 DIAGNOSIS — M25561 Pain in right knee: Secondary | ICD-10-CM | POA: Diagnosis not present

## 2023-06-08 DIAGNOSIS — Z96641 Presence of right artificial hip joint: Secondary | ICD-10-CM | POA: Diagnosis not present

## 2023-06-08 DIAGNOSIS — M25551 Pain in right hip: Secondary | ICD-10-CM | POA: Diagnosis not present

## 2023-06-10 DIAGNOSIS — M25561 Pain in right knee: Secondary | ICD-10-CM | POA: Diagnosis not present

## 2023-06-10 DIAGNOSIS — R269 Unspecified abnormalities of gait and mobility: Secondary | ICD-10-CM | POA: Diagnosis not present

## 2023-06-10 DIAGNOSIS — M6281 Muscle weakness (generalized): Secondary | ICD-10-CM | POA: Diagnosis not present

## 2023-06-10 DIAGNOSIS — M546 Pain in thoracic spine: Secondary | ICD-10-CM | POA: Diagnosis not present

## 2023-06-15 DIAGNOSIS — R269 Unspecified abnormalities of gait and mobility: Secondary | ICD-10-CM | POA: Diagnosis not present

## 2023-06-15 DIAGNOSIS — M546 Pain in thoracic spine: Secondary | ICD-10-CM | POA: Diagnosis not present

## 2023-06-15 DIAGNOSIS — M25561 Pain in right knee: Secondary | ICD-10-CM | POA: Diagnosis not present

## 2023-06-15 DIAGNOSIS — M6281 Muscle weakness (generalized): Secondary | ICD-10-CM | POA: Diagnosis not present

## 2023-06-18 ENCOUNTER — Other Ambulatory Visit: Payer: Self-pay | Admitting: Family Medicine

## 2023-06-18 DIAGNOSIS — E039 Hypothyroidism, unspecified: Secondary | ICD-10-CM

## 2023-06-21 DIAGNOSIS — R269 Unspecified abnormalities of gait and mobility: Secondary | ICD-10-CM | POA: Diagnosis not present

## 2023-06-21 DIAGNOSIS — M546 Pain in thoracic spine: Secondary | ICD-10-CM | POA: Diagnosis not present

## 2023-06-21 DIAGNOSIS — M25561 Pain in right knee: Secondary | ICD-10-CM | POA: Diagnosis not present

## 2023-06-21 DIAGNOSIS — M6281 Muscle weakness (generalized): Secondary | ICD-10-CM | POA: Diagnosis not present

## 2023-06-24 DIAGNOSIS — M6281 Muscle weakness (generalized): Secondary | ICD-10-CM | POA: Diagnosis not present

## 2023-06-24 DIAGNOSIS — M546 Pain in thoracic spine: Secondary | ICD-10-CM | POA: Diagnosis not present

## 2023-06-24 DIAGNOSIS — R269 Unspecified abnormalities of gait and mobility: Secondary | ICD-10-CM | POA: Diagnosis not present

## 2023-06-24 DIAGNOSIS — M25561 Pain in right knee: Secondary | ICD-10-CM | POA: Diagnosis not present

## 2023-06-28 DIAGNOSIS — M25561 Pain in right knee: Secondary | ICD-10-CM | POA: Diagnosis not present

## 2023-06-28 DIAGNOSIS — R269 Unspecified abnormalities of gait and mobility: Secondary | ICD-10-CM | POA: Diagnosis not present

## 2023-06-28 DIAGNOSIS — M546 Pain in thoracic spine: Secondary | ICD-10-CM | POA: Diagnosis not present

## 2023-06-28 DIAGNOSIS — M6281 Muscle weakness (generalized): Secondary | ICD-10-CM | POA: Diagnosis not present

## 2023-07-15 ENCOUNTER — Ambulatory Visit: Payer: Self-pay

## 2023-07-15 NOTE — Telephone Encounter (Signed)
 Copied from CRM [id number]. Topic: Clinical - Red Word Triage >> Jul 15, 2023  4:19 PM Crispin Dolphin wrote: Red Word that prompted transfer to Nurse Triage: Trouble breathing shortness of breathe, asthma attack. Out of inhaler - using 2023 one, not working   Chief Complaint: Asthma exacerbation  Symptoms: Shortness of breath  Frequency: Improving  Disposition: [] ED /[] Urgent Care (no appt availability in office) / [x] Appointment(In office/virtual)/ []  [place] [facility]/ [] Home Care/ [x] Refused Recommended Disposition /[] [place] [facility]/ []  Follow-up with PCP Additional Notes: Patient calling to report she had an asthma attack today and is out of her inhaler. She states she used an old one she had but that it was not as effective. She states her symptoms are improved now but that she does need refills of her Albuterol  Inhaler and nebulizer solution and wanted ot see if Dr. Fairy Homer could refill those for her. Patient also states she is out of her Oxycodone  stating that she was given 40 instead of 60 after discussing it with Dr. Fairy Homer but states she actually needed the 60 tablets. Please contact the patient with a response to her request.    Reason for Disposition  [1] MODERATE longstanding difficulty breathing (e.g., speaks in phrases, SOB even at rest, pulse 100-120) AND [2] SAME as normal  Answer Assessment - Initial Assessment Questions 1. RESPIRATORY STATUS: "Describe your breathing?" (e.g., wheezing, shortness of breath, unable to speak, severe coughing)      Shortness of breath  2. ONSET: "When did this breathing problem begin?"      Today  3. PATTERN "Does the difficult breathing come and go, or has it been constant since it started?"      Improving  4. SEVERITY: "How bad is your breathing?" (e.g., mild, moderate, severe)    - MILD: No SOB at rest, mild SOB with walking, speaks normally in sentences, can lie down, no retractions, pulse < 100.    - MODERATE: SOB at rest, SOB with  minimal exertion and prefers to sit, cannot lie down flat, speaks in phrases, mild retractions, audible wheezing, pulse 100-120.    - SEVERE: Very SOB at rest, speaks in single words, struggling to breathe, sitting hunched forward, retractions, pulse > 120      Mild to moderate now 5. RECURRENT SYMPTOM: "Have you had difficulty breathing before?" If Yes, ask: "When was the last time?" and "What happened that time?"      Yes, history of asthma  6. CARDIAC HISTORY: "Do you have any history of heart disease?" (e.g., heart attack, angina, bypass surgery, angioplasty)      Yes 7. LUNG HISTORY: "Do you have any history of lung disease?"  (e.g., pulmonary embolus, asthma, emphysema)     Asthma 8. CAUSE: "What do you think is causing the breathing problem?"      Had an asthma attack  9. OTHER SYMPTOMS: "Do you have any other symptoms? (e.g., dizziness, runny nose, cough, chest pain, fever)     No 10. O2 SATURATION MONITOR:  "Do you use an oxygen saturation monitor (pulse oximeter) at home?" If Yes, ask: "What is your reading (oxygen level) today?" "What is your usual oxygen saturation reading?" (e.g., 95%)       Unable to check  Protocols used: Breathing Difficulty-A-AH

## 2023-07-16 ENCOUNTER — Other Ambulatory Visit: Payer: Self-pay | Admitting: Nurse Practitioner

## 2023-07-16 MED ORDER — ALBUTEROL SULFATE HFA 108 (90 BASE) MCG/ACT IN AERS: 2.0000 | INHALATION_SPRAY | RESPIRATORY_TRACT | 0 refills | Status: AC | PRN

## 2023-07-16 MED ORDER — ALBUTEROL SULFATE (2.5 MG/3ML) 0.083% IN NEBU: 2.5000 mg | INHALATION_SOLUTION | Freq: Four times a day (QID) | RESPIRATORY_TRACT | 0 refills | Status: DC | PRN

## 2023-07-16 MED ORDER — OXYCODONE-ACETAMINOPHEN 10-325 MG PO TABS: ORAL_TABLET | ORAL | 0 refills | Status: DC

## 2023-07-16 NOTE — Telephone Encounter (Signed)
 Pt made aware per provider notes

## 2023-07-23 ENCOUNTER — Ambulatory Visit: Payer: Self-pay

## 2023-07-23 NOTE — Telephone Encounter (Addendum)
  Chief Complaint: upper back pain Symptoms: severe L upper back muscular contractions, radiates to neck/jaw Frequency: chronic, worsened the past 3 days Pertinent Negatives: Patient denies fevers, injuries/falls Disposition: [] ED /[x] Urgent Care (no appt availability in office) / [] Appointment(In office/virtual)/ []  [place] [facility]/ [] Home Care/ [x] Refused Recommended Disposition /[] [place] [facility]/ [x]  Follow-up with PCP Additional Notes: Pt c/o upper L back pain that radiates to neck/jaw. Pt endorses having chronic muscular back pain, but 3 days ago, pt woke up in position that exacerbated her pain and has been causing muscle contractions. Pt reports taking pain medications without relief. Pt requesting muscle relaxant or diclofenac  (VOLTAREN ) 75 MG EC tablet . Triager will forward encounter for Dr. Fairy Homer 's office to review and advise. Patient verbalized understanding and is expecting call back from office for next steps. Triager also advised that if pt does not hear back from office, to follow disposition for further evaluation/treatment.     Copied from CRM [id number]. Topic: Clinical - Red Word Triage >> Jul 23, 2023  4:30 PM Zipporah Him wrote: Red Word that prompted transfer to Nurse Triage: Severe pain and contraction in her upper back on the left side, pain level over 10. Reason for Disposition  [1] SEVERE back pain (e.g., excruciating, unable to do any normal activities) AND [2] not improved 2 hours after pain medicine  Answer Assessment - Initial Assessment Questions 1. ONSET: "When did the pain begin?"      X3 days 2. LOCATION: "Where does it hurt?" (upper, mid or lower back)     Upper L back/shoulder "It felt like it was going to detach from the bone" Reports muscle contraction hx - unknown dx, but has seen multiple specialists for these sx 3. SEVERITY: "How bad is the pain?"  (e.g., Scale 1-10; mild, moderate, or severe)   - MILD (1-3): Doesn't interfere with normal  activities.    - MODERATE (4-7): Interferes with normal activities or awakens from sleep.    - SEVERE (8-10): Excruciating pain, unable to do any normal activities.      Severe "unbearable" 4. PATTERN: "Is the pain constant?" (e.g., yes, no; constant, intermittent)      Constant  5. RADIATION: "Does the pain shoot into your legs or somewhere else?"     Neck to jaw 6. CAUSE:  "What do you think is causing the back pain?"      PMH 7. BACK OVERUSE:  "Any recent lifting of heavy objects, strenuous work or exercise?"     denies 8. MEDICINES: "What have you taken so far for the pain?" (e.g., nothing, acetaminophen , NSAIDS)     Muscle relaxants, oxycodone  9. NEUROLOGIC SYMPTOMS: "Do you have any weakness, numbness, or problems with bowel/bladder control?"     denies 10. OTHER SYMPTOMS: "Do you have any other symptoms?" (e.g., fever, abdomen pain, burning with urination, blood in urine)       Limited ROM/movement  Protocols used: Back Pain-A-AH

## 2023-07-24 ENCOUNTER — Other Ambulatory Visit: Payer: Self-pay | Admitting: Nurse Practitioner

## 2023-07-24 MED ORDER — CHLORZOXAZONE 500 MG PO TABS: 500.0000 mg | ORAL_TABLET | Freq: Three times a day (TID) | ORAL | 0 refills | Status: DC | PRN

## 2023-07-24 NOTE — Telephone Encounter (Signed)
 She has taken several Rx of Parafon  [place] in the past. A prescription was sent to [facility] in [place].

## 2023-08-12 ENCOUNTER — Ambulatory Visit: Payer: Self-pay

## 2023-08-12 NOTE — Telephone Encounter (Signed)
 Call dropped prior to finishing triage, called patient but no answer, LVM to call back. Routing for further attempts.   Copied from CRM [id number]. Topic: Clinical - Red Word Triage >> Aug 12, 2023 11:57 AM Virgia Griffins wrote: Bad pain on ring finger, stung by a bee. Pt would like to know what she should do next as she had a pretty bad reaction to a wasp sting in the past.   Answer Assessment - Initial Assessment Questions 1. TYPE: "What type of sting was it?" (bee, yellow jacket, etc.)      Bee 2. ONSET: "When did it occur?"      Today 3. LOCATION: "Where is the sting located?"  "How many stings?"     Right hand 3rd finger finger  4. SWELLING SIZE: "How big is the swelling?" (e.g., inches or cm)     Mild swelling now 5. REDNESS: "Is the area red or pink?" If Yes, ask: "What size is area of redness?" (e.g., inches or cm). "When did the redness start?"     Yes 6. PAIN: "Is there any pain?" If Yes, ask: "How bad is it?"  (Scale 1-10; or mild, moderate, severe)     10/10 9. PRIOR REACTIONS: "Have you had any severe allergic reactions to stings in the past?" if yes, ask: "What happened?"     Yes, swelling and fever  Protocols used: Bee or Yellow Jacket Sting-A-AH

## 2023-08-12 NOTE — Telephone Encounter (Signed)
 1st attempt to call back, left message to return call for nurse triage.

## 2023-08-12 NOTE — Telephone Encounter (Signed)
 3rd attempt, left voicemail for patient to return call to nurse triage. Routing to clinic.

## 2023-08-12 NOTE — Telephone Encounter (Signed)
 2nd attempt, left voicemail for patient to return call for nurse triage.

## 2023-08-12 NOTE — Telephone Encounter (Signed)
 This encounter was created in error - please disregard.

## 2023-08-12 NOTE — Telephone Encounter (Signed)
 Copied from CRM [id number]. Topic: Clinical - Red Word Triage >> Aug 12, 2023 12:08 PM Sophia H wrote: Red Word that prompted transfer to Nurse Triage: Bee sting on ring finger, had a bad reaction to a wasp sting in the past.  Was speaking to nurse triage but phone disconnected.

## 2023-08-25 DIAGNOSIS — Z91040 Latex allergy status: Secondary | ICD-10-CM | POA: Diagnosis not present

## 2023-08-25 DIAGNOSIS — Z880 Allergy status to penicillin: Secondary | ICD-10-CM | POA: Diagnosis not present

## 2023-08-25 DIAGNOSIS — T63441A Toxic effect of venom of bees, accidental (unintentional), initial encounter: Secondary | ICD-10-CM | POA: Diagnosis not present

## 2023-08-25 DIAGNOSIS — Z7983 Long term (current) use of bisphosphonates: Secondary | ICD-10-CM | POA: Diagnosis not present

## 2023-08-25 DIAGNOSIS — F32A Depression, unspecified: Secondary | ICD-10-CM | POA: Diagnosis not present

## 2023-08-25 DIAGNOSIS — Z79899 Other long term (current) drug therapy: Secondary | ICD-10-CM | POA: Diagnosis not present

## 2023-08-25 DIAGNOSIS — Z882 Allergy status to sulfonamides status: Secondary | ICD-10-CM | POA: Diagnosis not present

## 2023-08-25 DIAGNOSIS — T63444A Toxic effect of venom of bees, undetermined, initial encounter: Secondary | ICD-10-CM | POA: Diagnosis not present

## 2023-08-25 DIAGNOSIS — Z885 Allergy status to narcotic agent status: Secondary | ICD-10-CM | POA: Diagnosis not present

## 2023-08-25 DIAGNOSIS — F419 Anxiety disorder, unspecified: Secondary | ICD-10-CM | POA: Diagnosis not present

## 2023-08-25 DIAGNOSIS — K219 Gastro-esophageal reflux disease without esophagitis: Secondary | ICD-10-CM | POA: Diagnosis not present

## 2023-08-25 DIAGNOSIS — Z881 Allergy status to other antibiotic agents status: Secondary | ICD-10-CM | POA: Diagnosis not present

## 2023-08-26 ENCOUNTER — Ambulatory Visit
Admission: EM | Admit: 2023-08-26 | Discharge: 2023-08-26 | Disposition: A | Discharge status: Home / Self Care | Attending: Family Medicine | Admitting: Family Medicine

## 2023-08-26 ENCOUNTER — Ambulatory Visit: Admitting: Nurse Practitioner

## 2023-08-26 ENCOUNTER — Ambulatory Visit: Payer: Self-pay

## 2023-08-26 DIAGNOSIS — R220 Localized swelling, mass and lump, head: Secondary | ICD-10-CM | POA: Diagnosis not present

## 2023-08-26 DIAGNOSIS — T63441A Toxic effect of venom of bees, accidental (unintentional), initial encounter: Secondary | ICD-10-CM | POA: Diagnosis not present

## 2023-08-26 MED ORDER — PREDNISONE 20 MG PO TABS: 40.0000 mg | ORAL_TABLET | Freq: Every day | ORAL | 0 refills | Status: DC

## 2023-08-26 MED ORDER — CETIRIZINE HCL 5 MG PO CHEW
5.0000 mg | CHEWABLE_TABLET | Freq: Every day | ORAL | 0 refills | Status: AC
Start: 2023-08-26 — End: ?

## 2023-08-26 MED ORDER — FAMOTIDINE 20 MG PO TABS: 20.0000 mg | ORAL_TABLET | Freq: Two times a day (BID) | ORAL | 0 refills | Status: DC

## 2023-08-26 NOTE — Discharge Instructions (Signed)
 I have prescribed Zyrtec and Pepcid  to be used twice daily to help with the site allergic response from the bee sting.  As discussed, I do not feel that the course of prednisone  is completely necessary at this time but I have prescribed it in case the reaction continues to progress.  If you do decide to take this use caution as you have had side effects in the past and stop it if having side effects.  If you begin having chest tightness, wheezing, shortness of breath, throat itching or swelling, full body hives, nausea, vomiting, weakness, dizziness or any other severe symptoms go to the emergency department.

## 2023-08-26 NOTE — ED Triage Notes (Signed)
 Pt reports being stung by bee last night, was seen at the [facility] ED last night was given 25 mg's of Benadryl , tylenol , swelling present, states bee stung on the tip of the nose and the inside of the nose, pt now has facial pain.

## 2023-08-26 NOTE — Telephone Encounter (Signed)
 Noted

## 2023-08-26 NOTE — ED Provider Notes (Signed)
 [facility]    CSN: [id number] Arrival date & time: 08/26/23  1233      History   Chief Complaint No chief complaint on file.   HPI Jenna Contreras is a 78 y.o. female.   Patient presenting today with facial pain and concerns of swelling to the nose after a bee sting to the tip of her nose last evening.  She went to the emergency department after the incident and was found to not be in an anaphylactic reaction and to be in no acute distress so was given 25 mg of Benadryl , Tylenol  and discharged home with recommendations to continue antihistamines.  She did take 25 mg of Benadryl  this morning.  She denies progression of symptoms to include throat itching or swelling, chest tightness, wheezing, shortness of breath, nausea, vomiting, dizziness, weakness.  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  Has never had an anaphylactic reaction per patient.   Past Medical History:  Diagnosis Date   Anxiety    Arthritis    Arthritis    Asthma    Bipolar 2 disorder (HCC)    Cancer (HCC)    Basal Cell   Constipation    Depression    Fibromyalgia    GERD (gastroesophageal reflux disease)    Heart murmur    As small child    History of gout    History of skin cancer    HNP (herniated nucleus pulposus with myelopathy), thoracic    Hypothyroidism    Left eye injury    In ED 06/17/2014    Low BP    Migraines    OCD (obsessive compulsive disorder)    Osteoporosis    Psoriasis    Scoliosis     Patient Active Problem List   Diagnosis Date Noted   Allergic rhinitis 05/11/2022   Major depressive disorder with single episode, in full remission (HCC) 03/18/2022   Neuropathic pain of both legs 12/24/2021   Coronary artery disease involving native heart without angina pectoris 01/31/2020   Dyspepsia    FH:  colon cancer 09/18/2015   Osteoporosis 05/20/2015   Status post unilateral knee replacement 07/02/2014   Asthma, mild intermittent 02/06/2014   Hyperlipidemia 01/18/2014   S/P right THA, AA 05/09/2013   Encounter for pain management 05/07/2013   Osteoarthritis of right hip 02/15/2013   Chronic back pain 08/17/2012   Other specified hypothyroidism 08/17/2012   TIA (transient ischemic attack) 08/05/2011   Pulmonary nodule 08/05/2011    Past Surgical History:  Procedure Laterality Date   Arthroscopic left knee surgery     BIOPSY  10/28/2017   Procedure: BIOPSY;  Surgeon: Alyce Jubilee, MD;  Location: [facility];  Service: Endoscopy;;  duodenal biopsy , gastric biopsy    DILATION AND CURETTAGE OF UTERUS  x3   ESOPHAGOGASTRODUODENOSCOPY (EGD) WITH PROPOFOL  N/A 10/28/2017   Procedure: ESOPHAGOGASTRODUODENOSCOPY (EGD) WITH PROPOFOL ;  Surgeon: Alyce Jubilee, MD;  Location: [facility];  Service: Endoscopy;  Laterality: N/A;  8:15am   HIP SURGERY Right 2015   iliac wings     KNEE SURGERY Bilateral 2015, 2016   MANDIBLE SURGERY     X 2   PARTIAL KNEE ARTHROPLASTY Left 02/12/2014   Procedure: LEFT KNEE UNICOMPARTMENTAL MEDIALLY ARTHROPLASTY ;  Surgeon: Bevin Bucks, MD;  Location: [facility];  Service: Orthopedics;  Laterality: Left;   PARTIAL KNEE ARTHROPLASTY Right 07/02/2014   Procedure: RIGHT UNI KNEE ARTHOPLASTY MEDIALLY;  Surgeon: Claiborne Crew, MD;  Location: [facility];  Service: Orthopedics;  Laterality: Right;   rod in spine     scoliosis    TONSILLECTOMY     TOTAL HIP ARTHROPLASTY Right 05/09/2013   Procedure: RIGHT TOTAL HIP ARTHROPLASTY ANTERIOR APPROACH;  Surgeon: Bevin Bucks, MD;  Location: [facility];  Service: Orthopedics;  Laterality: Right;   TUBAL LIGATION     WRIST SURGERY      OB History   No obstetric history on file.      Home Medications    Prior to Admission medications   Medication Sig Start Date End Date Taking? Authorizing Provider  cetirizine (ZYRTEC) 5  MG chewable tablet Chew 1 tablet (5 mg total) by mouth daily. 08/26/23  Yes Corbin Dess, PA-C  chlorzoxazone  (PARAFON ) 500 MG tablet Take 1 tablet (500 mg total) by mouth 3 (three) times daily as needed for muscle spasms. 07/24/23   Derenda Flax, NP  famotidine  (PEPCID ) 20 MG tablet Take 1 tablet (20 mg total) by mouth 2 (two) times daily. 08/26/23  Yes Corbin Dess, PA-C  predniSONE  (DELTASONE ) 20 MG tablet Take 2 tablets (40 mg total) by mouth daily with breakfast. 08/26/23  Yes Corbin Dess, PA-C  albuterol  (PROAIR  HFA) 108 (90 Base) MCG/ACT inhaler USE 2 INHALATIONS EVERY 4 HOURS AS NEEDED 07/18/20   Antonio Baumgarten, NP  albuterol  (PROVENTIL ) (2.5 MG/3ML) 0.083% nebulizer solution Take 3 mLs (2.5 mg total) by nebulization every 6 (six) hours as needed for wheezing or shortness of breath. 07/16/23   Derenda Flax, NP  albuterol  (VENTOLIN  HFA) 108 (90 Base) MCG/ACT inhaler Inhale 2 puffs into the lungs every 4 (four) hours as needed. 07/16/23   Hoskins, Carolyn C, NP  chlorpheniramine  (CHLOR-TRIMETON ) 4 MG tablet Take 1 tablet (4 mg total) by mouth every 4 (four) hours as needed for allergies (Cough). 03/27/21   Antonio Baumgarten, NP  Cholecalciferol (VITAMIN D ) 125 MCG (5000 UT) CAPS Take by mouth.    [provider]  cyanocobalamin (VITAMIN B12) 1000 MCG tablet Take 1,000 mcg by mouth daily.    [provider]  fluticasone  (FLOVENT  HFA) 110 MCG/ACT inhaler Inhale 2 puffs into the lungs 2 (two) times daily. 06/07/23   Corbin Dess, PA-C  hydrOXYzine  (ATARAX ) 25 MG tablet Take 1 tablet (25 mg total) by mouth every 4 (four) hours as needed for anxiety or itching. 07/03/22   Bennet Brasil, MD  hydrOXYzine  (VISTARIL ) 25 MG capsule Take 1 capsule (25 mg total) by mouth every 6 (six) hours as needed. Take 1 capsule (25 mg total) by mouth every 6 (six) hour as needed for hives. 10/19/22   Bennet Brasil, MD  levothyroxine  (SYNTHROID ) 75 MCG  tablet TAKE ONE TABLET BY MOUTH EVERY DAY 06/18/23   Bennet Brasil, MD  magnesium  oxide (MAG-OX) 400 (240 Mg) MG tablet Take 400 mg by mouth daily.    [provider]  NONFORMULARY OR COMPOUNDED ITEM Apply topically. 12/10/20   [provider]  ondansetron  (ZOFRAN ) 4 MG tablet Take 1 tablet (4 mg total) by mouth every 6 (six) hours as needed for nausea or vomiting. 02/28/23   Katherine Pancake, PA-C  OVER THE COUNTER MEDICATION [place] and dulcolax    [provider]  oxyCODONE -acetaminophen  (PERCOCET) 10-325 MG tablet 1 bid prn pain  05/26/23   Bennet Brasil, MD  oxyCODONE -acetaminophen  (PERCOCET) 10-325 MG tablet 1 twice daily as needed pain 05/26/23   Bennet Brasil, MD  oxyCODONE -acetaminophen  (PERCOCET) 10-325 MG tablet 1 twice daily as needed pain 07/16/23   Hoskins, Carolyn C, NP  Polyethyl Glycol-Propyl Glycol (SYSTANE ULTRA OP) Place 1 drop into both eyes at bedtime.    [provider]  promethazine -dextromethorphan (PROMETHAZINE -DM) 6.25-15 MG/5ML syrup Take 5 mLs by mouth 4 (four) times daily as needed. 06/07/23   Corbin Dess, PA-C  rizatriptan  (MAXALT ) 10 MG tablet TAKE AS DIRECTED, MAY REPEAT IN 2 HOURS IF NEEDED 05/26/23   Bennet Brasil, MD  triamcinolone  acetonide (TRIESENCE ) 40 MG/ML SUSP Inject into the articular space. 04/29/21   [provider]  trolamine salicylate (ASPERCREME) 10 % cream Apply 1 application topically as needed for muscle pain.    [provider]    Family History Family History  Problem Relation Age of Onset   Heart attack Mother 64       MULTIPLE   Heart disease Mother 62   Diabetes Mother    Colon cancer Father 19   Heart disease Brother 25    Social History Social History   Tobacco Use   Smoking status: Former    Current packs/day: 0.00    Average packs/day: 2.0 packs/day for 19.0 years (38.0 ttl pk-yrs)    Types: Cigarettes    Start date: 03/09/1960    Quit date: 03/10/1979    Years since  quitting: 44.4    Passive exposure: Past   Smokeless tobacco: Never   Tobacco comments:    Quit in 1981  Vaping Use   Vaping status: Never Used  Substance Use Topics   Alcohol  use: No    Alcohol /week: 0.0 standard drinks of alcohol     Comment: no   Drug use: No     Allergies   Bee venom, Betadine  [povidone-iodine ], Prednisone , Silvadene [silver sulfadiazine], Sulfa  antibiotics, Latex, Wound dressing adhesive, Adhesive [tape], Betadine  [povidone iodine ], Lamictal  [lamotrigine ], Levaquin [levofloxacin in d5w], Tizanidine , Trazodone  and nefazodone, Zyrtec [cetirizine], Baclofen , Benadryl  [diphenhydramine ], Cephalexin, Claritin  [loratadine ], Demerol [meperidine], Doxycycline, Erythromycin, Keflex [cephalexin], Other, and Tetracyclines & related   Review of Systems Review of Systems PER HPI  Physical Exam Triage Vital Signs ED Triage Vitals  Encounter Vitals Group     BP 08/26/23 1236 117/72     Girls Systolic BP Percentile --      Girls Diastolic BP Percentile --      Boys Systolic BP Percentile --      Boys Diastolic BP Percentile --      Pulse Rate 08/26/23 1236 75     Resp 08/26/23 1236 18     Temp 08/26/23 1236 98.6 F (37 C)     Temp Source 08/26/23 1236 Oral     SpO2 08/26/23 1236 96 %     Weight --      Height --      Head Circumference --      Peak Flow --      Pain Score 08/26/23 1241 9     Pain Loc --      Pain Education --      Exclude from Growth Chart --    No data found.  Updated Vital Signs BP 117/72 (BP Location: Right Arm)   Pulse 75   Temp 98.6 F (37 C) (Oral)   Resp 18   SpO2 96%   Visual Acuity Right Eye Distance:  Left Eye Distance:   Bilateral Distance:    Right Eye Near:   Left Eye Near:    Bilateral Near:     Physical Exam Vitals and nursing note reviewed.  Constitutional:      Appearance: Normal appearance. She is not ill-appearing.  HENT:     Head: Atraumatic.     Nose: Nose normal.     Comments: Bilateral nares benign  appearing, patent bilaterally with no obvious obstructions.  No appreciable swelling    Mouth/Throat:     Mouth: Mucous membranes are moist.     Pharynx: Oropharynx is clear. No posterior oropharyngeal erythema.     Comments: No appreciable lip swelling or swelling to the oropharyngeal region  Eyes:     Extraocular Movements: Extraocular movements intact.     Conjunctiva/sclera: Conjunctivae normal.     Pupils: Pupils are equal, round, and reactive to light.    Cardiovascular:     Rate and Rhythm: Normal rate and regular rhythm.     Heart sounds: Normal heart sounds.  Pulmonary:     Effort: Pulmonary effort is normal. No respiratory distress.     Breath sounds: Normal breath sounds. No stridor. No wheezing or rales.   Musculoskeletal:        General: No swelling or deformity. Normal range of motion.     Cervical back: Normal range of motion and neck supple.   Skin:    General: Skin is warm and dry.     Coloration: Skin is not pale.     Findings: No bruising, erythema or rash.   Neurological:     Mental Status: She is alert and oriented to person, place, and time.     Motor: No weakness.     Gait: Gait normal.   Psychiatric:        Mood and Affect: Mood normal.        Thought Content: Thought content normal.        Judgment: Judgment normal.    UC Treatments / Results  Labs (all labs ordered are listed, but only abnormal results are displayed) Labs Reviewed - No data to display  EKG   Radiology No results found.  Procedures Procedures (including critical care time)  Medications Ordered in UC Medications - No data to display  Initial Impression / Assessment and Plan / UC Course  I have reviewed the triage vital signs and the nursing notes.  Pertinent labs & imaging results that were available during my care of the patient were reviewed by me and considered in my medical decision making (see chart for details).     Very well-appearing today, in no acute  distress, speaking full sentences and breathing comfortably, vital signs within normal limits.  No concerning features on exam today, no appreciable edema, lungs clear to auscultation bilaterally, no rashes.  Discussed with patient that some localized swelling and itching is to be expected with any sting and as long as things do not progress further to symptoms such as chest tightness, wheezing, shortness of breath, throat itching or swelling, full body rash, nausea, vomiting, weakness, dizziness, chest pain to continue to monitor and use supportive measures.  Discussed that Benadryl  is very short acting so commended switching to a long-acting antihistamine such as Zyrtec and pairing with Pepcid  for better effectiveness.  She is requesting steroids today, discussed that these are not entirely necessary at this time given the mild nature of her symptoms and no true generalized allergic reaction but will prescribe  in case symptoms are progressing.  She does have listed intolerances to antihistamines and prednisone  listed in her chart but has tolerated both since these allergies were added without difficulty and is comfortable taking them and continuing to monitor.  Again reiterated going to the emergency department if any of the severe symptoms discussed occur.  Final Clinical Impressions(s) / UC Diagnoses   Final diagnoses:  Facial swelling  Bee sting, accidental or unintentional, initial encounter     Discharge Instructions      I have prescribed Zyrtec and Pepcid  to be used twice daily to help with the site allergic response from the bee sting.  As discussed, I do not feel that the course of prednisone  is completely necessary at this time but I have prescribed it in case the reaction continues to progress.  If you do decide to take this use caution as you have had side effects in the past and stop it if having side effects.  If you begin having chest tightness, wheezing, shortness of breath, throat  itching or swelling, full body hives, nausea, vomiting, weakness, dizziness or any other severe symptoms go to the emergency department.    ED Prescriptions     Medication Sig Dispense Auth. Provider   cetirizine (ZYRTEC) 5 MG chewable tablet Chew 1 tablet (5 mg total) by mouth daily. 20 tablet Corbin Dess, PA-C   famotidine  (PEPCID ) 20 MG tablet Take 1 tablet (20 mg total) by mouth 2 (two) times daily. 20 tablet Corbin Dess, PA-C   predniSONE  (DELTASONE ) 20 MG tablet Take 2 tablets (40 mg total) by mouth daily with breakfast. 10 tablet Corbin Dess, [place]      PDMP not reviewed this encounter.   Corbin Dess, [place] 08/26/23 1343

## 2023-08-26 NOTE — Telephone Encounter (Signed)
 FYI Only or Action Required?: Action required by provider: request for appointment.  Patient was last seen in primary care on 05/26/2023 by Bennet Brasil, MD. Called Nurse Triage reporting Insect Bite. Symptoms began yesterday. Interventions attempted: OTC medications: Benadryl   and Ice/heat application. Symptoms are: rapidly worsening.  Triage Disposition: to Urgent Care  Patient/caregiver understands and will follow disposition?: Yes Pt has a delayed response to bee stings. Pt stated it can take 24 72 hours before reaction takes place. Pt very unhappy with care at ED last night. Pt was initually given appt, but due to hx and worsening swelling sent to UC. Advised pt to take 25 mg Benadryl  and ice pak and go to UC ASAP. Pt stated she can drive.        Copied from CRM [id number]. Topic: Clinical - Red Word Triage >> Aug 26, 2023 10:37 AM Hamp Levine R wrote: [facility] that prompted transfer to Nurse Triage: Patient states she was stung 3 times last night, finger, tip of nose, and up her nose. Went to the ER last night at [facility] and they gave her some benadryl  and tylenol . She told them she has delayed reactions but they were not helpful. Woke up today with extreme pain in her nose where she was stung and under her eye, her right side of her face is swollen with her eye almost all the shut. Feels like there is something up high into her nose. Reason for Disposition  [1] Red or very tender (to touch) area AND [2] getting larger over 48 hours after the sting    Happening < 24 hours - pt stated she has a delayed response to stings. No EpiPen   Answer Assessment - Initial Assessment Questions 1. TYPE: What type of sting was it? (bee, yellow jacket, etc.)      bee 2. ONSET: When did it occur?      Last night  2030 3. LOCATION: Where is the sting located?  How many stings?     Tip of nose, up nose 4. SWELLING SIZE: How big is the swelling? (e.g., inches or cm)     Right side of face and  eye, bridge of nose, upper lip, eyebrow swelling and is spreading of between 5. REDNESS: Is the area red or pink? If Yes, ask: What size is area of redness? (e.g., inches or cm). When did the redness start?     Mild  6. PAIN: Is there any pain? If Yes, ask: How bad is it?  (Scale 1-10; or mild, moderate, severe)     yes 7. ITCHING: Is there any itching? If Yes, ask: How bad is it?      no 8. RESPIRATORY DISTRESS: Describe your breathing.     no 9. PRIOR REACTIONS: Have you had any severe allergic reactions to stings in the past? if yes, ask: What happened?     Yes- usually has delayed reactions 10. OTHER SYMPTOMS: Do you have any other symptoms? (e.g., abdomen pain, face or tongue swelling, new rash elsewhere, vomiting)       Feels something is hurting way up of nose 11. PREGNANCY: Is there any chance you are pregnant? When was your last menstrual period?       N/a  Protocols used: Bee or Yellow Jacket Sting-A-AH

## 2023-09-03 ENCOUNTER — Ambulatory Visit (INDEPENDENT_AMBULATORY_CARE_PROVIDER_SITE_OTHER): Admitting: Family Medicine

## 2023-09-03 ENCOUNTER — Encounter: Payer: Self-pay | Admitting: Family Medicine

## 2023-09-03 ENCOUNTER — Telehealth: Payer: Self-pay | Admitting: Family Medicine

## 2023-09-03 VITALS — BP 94/59 | HR 82 | Temp 98.2°F | Ht 61.0 in | Wt 122.0 lb

## 2023-09-03 DIAGNOSIS — Z79891 Long term (current) use of opiate analgesic: Secondary | ICD-10-CM | POA: Diagnosis not present

## 2023-09-03 DIAGNOSIS — E039 Hypothyroidism, unspecified: Secondary | ICD-10-CM | POA: Diagnosis not present

## 2023-09-03 DIAGNOSIS — M8580 Other specified disorders of bone density and structure, unspecified site: Secondary | ICD-10-CM | POA: Diagnosis not present

## 2023-09-03 MED ORDER — EPINEPHRINE 0.3 MG/0.3ML IJ SOAJ
0.3000 mg | INTRAMUSCULAR | 1 refills | Status: AC | PRN
Start: 2023-09-03 — End: ?

## 2023-09-03 MED ORDER — CHLORZOXAZONE 500 MG PO TABS: ORAL_TABLET | ORAL | 0 refills | Status: DC

## 2023-09-03 NOTE — Progress Notes (Signed)
 Subjective:    Patient ID: Jenna Contreras, female    DOB: 11-04-1945, 78 y.o.   MRN: [id number]  HPI 3 month follow up - pain management Recent yellow jacket stings  This patient was seen today for chronic pain  The medication list was reviewed and updated.   Location of Pain for which the patient has been treated with regarding narcotics: Lumbar mid back  Onset of this pain: Present for years   -Compliance with medication: Actually patient has been doing much better recently he has not had to take pain medicine but she would like to leave her pain medicine on the record just in case she needs it on a given day  - Number patient states they take daily: 1 or 2  -Reason for ongoing use of opioids does not get adequate relief with Tylenol  should not take NSAIDs  What other measures have been tried outside of opioids previous surgeries  In the ongoing specialists regarding this condition back specialist  -when was the last dose patient took?  None in the past 2 weeks  The patient was advised the importance of maintaining medication and not using illegal substances with these.  Here for refills and follow up  The patient was educated that we can provide 3 monthly scripts for their medication, it is their responsibility to follow the instructions.  Side effects or complications from medications: Denies side effects  Patient is aware that pain medications are meant to minimize the severity of the pain to allow their pain levels to improve to allow for better function. They are aware of that pain medications cannot totally remove their pain.  Due for UDT ( at least once per year) (pain management contract is also completed at the time of the UDT): January 2026  Scale of 1 to 10 ( 1 is least 10 is most) Your pain level without the medicine: 7 Your pain level with medication 2  Scale 1 to 10 ( 1-helps very little, 10 helps very well) How well does your pain medication  reduce your pain so you can function better through out the day?  9  Quality of the pain: Throbbing aching  Persistence of the pain: Not always present  Modifying factors: Worse with activity  Patient also relates that she has a ruptured implant that she thinks she may need to have surgery but she states currently right now with her back pain she cannot tolerate having surgery is too painful to lay for any significant length of time.     Review of Systems     Objective:   Physical Exam General-in no acute distress Eyes-no discharge Lungs-respiratory rate normal, CTA CV-no murmurs,RRR Extremities skin warm dry no edema Neuro grossly normal Behavior normal, alert Subjective back discomfort evidence of previous surgery noted       Assessment & Plan:  Back pain-currently right now she is doing very well with behavioral approaches to help with her pain therefore she does not need her pain medicine currently but if she does take her pain medicine she was cautioned about drowsiness and not to drive with the pain medicine.  She is not to exceed more than 2 tablets Per day She also states she is interested in getting away from Tylenol  if possible I told her on her next prescription we could do plain oxycodone  Currently right now I am not sending in any refills because she is really not having to use the pain medicine she will keep us   updated  She did have yellowjacket stings which were pretty severe you had a lot of swelling in her hand as well as face we talked about the possibility that future stings could precipitate anaphylaxis we discussed what anaphylaxis is we discussed the treatment with EpiPen  and going to the ER she would like to have EpiPen  on hand just in case therefore this was sent in  Lab work was ordered as well for thyroid  continue current medication  Follow-up in 4 months  She does have osteopenia/osteoporosis I am recommending for her to get a bone density test we  will try for that later this year  It should also be noted that she is possibly getting get plastic surgery in the future for a ruptured breast but currently because of her back she cannot lay on a flat table for anesthesia without severe pain therefore I have recommended currently that she should not do any surgery unless it is life-threatening.

## 2023-09-03 NOTE — Telephone Encounter (Signed)
 Front staff Patient requested a letter regarding not having surgery. I dictated that letter please print and have me sign it and give it to the patient-if she would prefer for it to be mailed that is fine otherwise have her pick it up thank you

## 2023-09-20 DIAGNOSIS — H43393 Other vitreous opacities, bilateral: Secondary | ICD-10-CM | POA: Diagnosis not present

## 2023-11-12 ENCOUNTER — Ambulatory Visit: Payer: Medicare Other

## 2023-11-12 VITALS — Ht 61.0 in | Wt 122.0 lb

## 2023-11-12 DIAGNOSIS — Z0000 Encounter for general adult medical examination without abnormal findings: Secondary | ICD-10-CM

## 2023-11-12 NOTE — Progress Notes (Addendum)
 Subjective:   Jenna Contreras is a 78 y.o. who presents for a Medicare Wellness preventive visit.  As a reminder, Annual Wellness Visits don't include a physical exam, and some assessments may be limited, especially if this visit is performed virtually. We may recommend an in-person follow-up visit with your provider if needed.  Visit Complete: Virtual I connected with  Jenna Contreras on 11/12/23 by a audio enabled telemedicine application and verified that I am speaking with the correct person using two identifiers.  Patient Location: Home  Provider Location: Home Office  I discussed the limitations of evaluation and management by telemedicine. The patient expressed understanding and agreed to proceed.  Vital Signs: Because this visit was a virtual/telehealth visit, some criteria may be missing or patient reported. Any vitals not documented were not able to be obtained and vitals that have been documented are patient reported.  VideoError- Librarian, academic were attempted between this provider and patient, however failed, due to patient having technical difficulties OR patient did not have access to video capability.  We continued and completed visit with audio only.   Persons Participating in Visit: Patient.  AWV Questionnaire: No: Patient Medicare AWV questionnaire was not completed prior to this visit.  Cardiac Risk Factors include: advanced age (>33men, >41 women);hypertension     Objective:    Today's Vitals   11/12/23 1032  Weight: 122 lb (55.3 kg)  Height: 5' 1 (1.549 m)   Body mass index is 23.05 kg/m.     11/12/2023   10:51 AM 02/28/2023    3:51 PM 11/06/2022   11:00 AM 05/28/2022    3:12 PM 11/25/2021    1:45 PM 07/24/2021   11:38 PM 03/03/2021    6:29 PM  Advanced Directives  Does Patient Have a Medical Advance Directive? No No No No Yes No No  Type of Agricultural consultant;Living will     Does patient want to make changes to medical advance directive?     No - Patient declined    Copy of Healthcare Power of Attorney in Chart?     No - copy requested    Would patient like information on creating a medical advance directive? Yes (MAU/Ambulatory/Procedural Areas - Information given) No - Patient declined No - Patient declined   No - Patient declined No - Patient declined    Current Medications (verified) Outpatient Encounter Medications as of 11/12/2023  Medication Sig   albuterol  (PROAIR  HFA) 108 (90 Base) MCG/ACT inhaler USE 2 INHALATIONS EVERY 4 HOURS AS NEEDED   albuterol  (PROVENTIL ) (2.5 MG/3ML) 0.083% nebulizer solution Take 3 mLs (2.5 mg total) by nebulization every 6 (six) hours as needed for wheezing or shortness of breath.   albuterol  (VENTOLIN  HFA) 108 (90 Base) MCG/ACT inhaler Inhale 2 puffs into the lungs every 4 (four) hours as needed.   cetirizine  (ZYRTEC ) 5 MG chewable tablet Chew 1 tablet (5 mg total) by mouth daily.   chlorpheniramine  (CHLOR-TRIMETON ) 4 MG tablet Take 1 tablet (4 mg total) by mouth every 4 (four) hours as needed for allergies (Cough).   chlorzoxazone  (PARAFON ) 500 MG tablet One q 8 hour prn not with oxycodone  caution drowsiness   Cholecalciferol (VITAMIN D ) 125 MCG (5000 UT) CAPS Take by mouth.   cyanocobalamin (VITAMIN B12) 1000 MCG tablet Take 1,000 mcg by mouth daily.   EPINEPHrine  0.3 mg/0.3 mL IJ SOAJ injection Inject 0.3 mg into the muscle as needed for anaphylaxis.  famotidine  (PEPCID ) 20 MG tablet Take 1 tablet (20 mg total) by mouth 2 (two) times daily.   fluticasone  (FLOVENT  HFA) 110 MCG/ACT inhaler Inhale 2 puffs into the lungs 2 (two) times daily.   hydrOXYzine  (ATARAX ) 25 MG tablet Take 1 tablet (25 mg total) by mouth every 4 (four) hours as needed for anxiety or itching.   hydrOXYzine  (VISTARIL ) 25 MG capsule Take 1 capsule (25 mg total) by mouth every 6 (six) hours as needed. Take 1 capsule (25 mg total) by mouth every 6 (six) hour  as needed for hives.   levothyroxine  (SYNTHROID ) 75 MCG tablet TAKE ONE TABLET BY MOUTH EVERY DAY   magnesium  oxide (MAG-OX) 400 (240 Mg) MG tablet Take 400 mg by mouth daily.   NONFORMULARY OR COMPOUNDED ITEM Apply topically.   ondansetron  (ZOFRAN ) 4 MG tablet Take 1 tablet (4 mg total) by mouth every 6 (six) hours as needed for nausea or vomiting.   OVER THE COUNTER MEDICATION [place] and dulcolax   oxyCODONE -acetaminophen  (PERCOCET) 10-325 MG tablet 1 bid prn pain   oxyCODONE -acetaminophen  (PERCOCET) 10-325 MG tablet 1 twice daily as needed pain   oxyCODONE -acetaminophen  (PERCOCET) 10-325 MG tablet 1 twice daily as needed pain   Polyethyl Glycol-Propyl Glycol (SYSTANE ULTRA OP) Place 1 drop into both eyes at bedtime.   rizatriptan  (MAXALT ) 10 MG tablet TAKE AS DIRECTED, MAY REPEAT IN 2 HOURS IF NEEDED   triamcinolone  acetonide (TRIESENCE ) 40 MG/ML SUSP Inject into the articular space.   trolamine salicylate (ASPERCREME) 10 % cream Apply 1 application topically as needed for muscle pain.   predniSONE  (DELTASONE ) 20 MG tablet Take 2 tablets (40 mg total) by mouth daily with breakfast. (Patient not taking: Reported on 11/12/2023)   promethazine -dextromethorphan (PROMETHAZINE -DM) 6.25-15 MG/5ML syrup Take 5 mLs by mouth 4 (four) times daily as needed. (Patient not taking: Reported on 11/12/2023)   No facility-administered encounter medications on file as of 11/12/2023.    Allergies (verified) Bee venom, Betadine  [povidone-iodine ], Prednisone , Silvadene [silver sulfadiazine], Sulfa  antibiotics, Latex, Wound dressing adhesive, Adhesive [tape], Betadine  [povidone iodine ], Lamictal  [lamotrigine ], Levaquin [levofloxacin in d5w], Tizanidine , Trazodone  and nefazodone, Zyrtec  [cetirizine ], Baclofen , Benadryl  [diphenhydramine ], Cephalexin, Claritin  [loratadine ], Demerol [meperidine], Doxycycline, Erythromycin, Keflex [cephalexin], Other, and Tetracyclines & related   History: Past Medical History:  Diagnosis  Date   Anxiety    Arthritis    Arthritis    Asthma    Bipolar 2 disorder (HCC)    Cancer (HCC)    Basal Cell   Constipation    Depression    Fibromyalgia    GERD (gastroesophageal reflux disease)    Heart murmur    As small child    History of gout    History of skin cancer    HNP (herniated nucleus pulposus with myelopathy), thoracic    Hypothyroidism    Left eye injury    In ED 06/17/2014    Low BP    Migraines    OCD (obsessive compulsive disorder)    Osteoporosis    Psoriasis    Scoliosis    Past Surgical History:  Procedure Laterality Date   Arthroscopic left knee surgery     BIOPSY  10/28/2017   Procedure: BIOPSY;  Surgeon: Harvey Margo CROME, MD;  Location: [facility];  Service: Endoscopy;;  duodenal biopsy , gastric biopsy    DILATION AND CURETTAGE OF UTERUS  x3   ESOPHAGOGASTRODUODENOSCOPY (EGD) WITH PROPOFOL  N/A 10/28/2017   Procedure: ESOPHAGOGASTRODUODENOSCOPY (EGD) WITH PROPOFOL ;  Surgeon: Harvey Margo CROME, MD;  Location:  [facility];  Service: Endoscopy;  Laterality: N/A;  8:15am   HIP SURGERY Right 2015   iliac wings     KNEE SURGERY Bilateral 2015, 2016   MANDIBLE SURGERY     X 2   PARTIAL KNEE ARTHROPLASTY Left 02/12/2014   Procedure: LEFT KNEE UNICOMPARTMENTAL MEDIALLY ARTHROPLASTY ;  Surgeon: Donnice JONETTA Car, MD;  Location: [facility];  Service: Orthopedics;  Laterality: Left;   PARTIAL KNEE ARTHROPLASTY Right 07/02/2014   Procedure: RIGHT UNI KNEE ARTHOPLASTY MEDIALLY;  Surgeon: Donnice Car, MD;  Location: [facility];  Service: Orthopedics;  Laterality: Right;   rod in spine     scoliosis    TONSILLECTOMY     TOTAL HIP ARTHROPLASTY Right 05/09/2013   Procedure: RIGHT TOTAL HIP ARTHROPLASTY ANTERIOR APPROACH;  Surgeon: Donnice JONETTA Car, MD;  Location: [facility];  Service: Orthopedics;  Laterality: Right;   TUBAL LIGATION     WRIST SURGERY     Family History  Problem Relation Age of Onset   Heart attack Mother 64       MULTIPLE   Heart disease Mother 59    Diabetes Mother    Colon cancer Father 71   Heart disease Brother 74   Social History   Socioeconomic History   Marital status: Widowed    Spouse name: Not on file   Number of children: 1   Years of education: 39   Highest education level: 12th grade  Occupational History   Occupation: Veterinary surgeon and retired Engineer, civil (consulting)  Tobacco Use   Smoking status: Former    Current packs/day: 0.00    Average packs/day: 2.0 packs/day for 19.0 years (38.0 ttl pk-yrs)    Types: Cigarettes    Start date: 03/09/1960    Quit date: 03/10/1979    Years since quitting: 44.7    Passive exposure: Past   Smokeless tobacco: Never   Tobacco comments:    Quit in 1981  Vaping Use   Vaping status: Never Used  Substance and Sexual Activity   Alcohol  use: No    Alcohol /week: 0.0 standard drinks of alcohol     Comment: no   Drug use: No   Sexual activity: Not Currently    Birth control/protection: Post-menopausal  Other Topics Concern   Not on file  Social History Narrative   Not on file   Social Drivers of Health   Financial Resource Strain: Low Risk  (11/12/2023)   Overall Financial Resource Strain (CARDIA)    Difficulty of Paying Living Expenses: Not hard at all  Food Insecurity: No Food Insecurity (11/12/2023)   Hunger Vital Sign    Worried About Running Out of Food in the Last Year: Never true    Ran Out of Food in the Last Year: Never true  Transportation Needs: No Transportation Needs (11/12/2023)   PRAPARE - Administrator, Civil Service (Medical): No    Lack of Transportation (Non-Medical): No  Physical Activity: Inactive (11/12/2023)   Exercise Vital Sign    Days of Exercise per Week: 0 days    Minutes of Exercise per Session: 0 min  Stress: No Stress Concern Present (11/12/2023)   [employer] of Occupational Health - Occupational Stress Questionnaire    Feeling of Stress: Not at all  Social Connections: Socially Isolated (11/12/2023)   Social Connection and Isolation Panel     Frequency of Communication with Friends and Family: More than three times a week    Frequency of Social Gatherings with Friends and Family: More  than three times a week    Attends Religious Services: Never    Active Member of Clubs or Organizations: No    Attends Banker Meetings: Never    Marital Status: Widowed    Tobacco Counseling Counseling given: Not Answered Tobacco comments: Quit in 1981    Clinical Intake:  Pre-visit preparation completed: Yes  Pain : No/denies pain; has chronic back pain   Diabetes: No  Lab Results  Component Value Date   HGBA1C 5.5 10/05/2011     How often do you need to have someone help you when you read instructions, pamphlets, or other written materials from your doctor or pharmacy?: 1 - Never  Interpreter Needed?: No  Information entered by :: Charmaine Bloodgood LPN   Activities of Daily Living     11/12/2023   10:50 AM  In your present state of health, do you have any difficulty performing the following activities:  Hearing? 0  Vision? 0  Difficulty concentrating or making decisions? 0  Walking or climbing stairs? 1  Dressing or bathing? 0  Doing errands, shopping? 0  Preparing Food and eating ? N  Using the Toilet? N  In the past six months, have you accidently leaked urine? N  Do you have problems with loss of bowel control? N  Managing your Medications? N  Managing your Finances? N  Housekeeping or managing your Housekeeping? N    Patient Care Team: Alphonsa Glendia LABOR, MD as PCP - General (Family Medicine) Debera Jayson MATSU, MD as PCP - Cardiology (Cardiology) Wallene Norleen SQUIBB, MD as Referring Physician (Orthopedic Surgery) Catherene Toribio Pac, MD as Referring Physician (Anesthesiology) Pichardo-Geisinger, Ricka KIDD, MD as Referring Physician  I have updated your Care Teams any recent Medical Services you may have received from other providers in the past year.     Assessment:   This is a routine wellness  examination for Jenna Contreras.  Hearing/Vision screen Hearing Screening - Comments:: Denies hearing difficulties   Vision Screening - Comments:: up to date with routine eye exams with [facility]    Goals Addressed             This Visit's Progress    Maintain health and independence   On track    Prevent falls   On track      Depression Screen     11/12/2023   11:13 AM 09/03/2023   11:27 AM 05/26/2023   10:52 AM 03/22/2023    2:10 PM 02/19/2023   10:10 AM 11/06/2022   11:05 AM 06/17/2022   10:42 AM  PHQ 2/9 Scores  PHQ - 2 Score 0 0 0 0 0 0 0  PHQ- 9 Score  3 6 6 6  3     Fall Risk     11/12/2023   10:50 AM 05/26/2023   10:52 AM 03/22/2023    2:10 PM 02/19/2023   10:10 AM 11/06/2022   11:16 AM  Fall Risk   Falls in the past year? 1 0 0 0 0  Number falls in past yr: 1    0  Injury with Fall? 0    0  Risk for fall due to : History of fall(s);Impaired balance/gait;Impaired mobility    No Fall Risks  Follow up Education provided;Falls prevention discussed;Falls evaluation completed    Falls prevention discussed    MEDICARE RISK AT HOME:  Medicare Risk at Home Any stairs in or around the home?: No If so, are there any without handrails?: No  Home free of loose throw rugs in walkways, pet beds, electrical cords, etc?: Yes Adequate lighting in your home to reduce risk of falls?: Yes Life alert?: No Use of a cane, walker or w/c?: Yes Grab bars in the bathroom?: Yes Shower chair or bench in shower?: Yes Elevated toilet seat or a handicapped toilet?: Yes  TIMED UP AND GO:  Was the test performed?  No  Cognitive Function: 6CIT completed        11/12/2023   10:51 AM 11/06/2022   11:05 AM  6CIT Screen  What Year? 0 points 0 points  What month? 0 points 0 points  What time? 0 points 0 points  Count back from 20 0 points 0 points  Months in reverse 0 points 0 points  Repeat phrase 0 points 0 points  Total Score 0 points 0 points    Immunizations Immunization History   Administered Date(s) Administered   Fluad Quad(high Dose 65+) 01/19/2020   Influenza Split 03/13/2011, 11/18/2011, 02/24/2013   Influenza,inj,Quad PF,6+ Mos 01/18/2014, 02/18/2015, 11/22/2015, 11/25/2016, 12/15/2017   Influenza-Unspecified 12/08/2011, 01/19/2020   Moderna Sars-Covid-2 Vaccination 10/15/2019   Pneumococcal Conjugate-13 01/18/2014   Pneumococcal Polysaccharide-23 03/13/2011   Rabies, IM 01/06/2017, 01/09/2017, 01/13/2017, 01/20/2017   Tdap 01/06/2017, 09/23/2019   Zoster, Live 01/28/2012    Screening Tests Health Maintenance  Topic Date Due   Hepatitis C Screening  Never done   Zoster Vaccines- Shingrix (1 of 2) 02/26/1965   COVID-19 Vaccine (2 - Moderna risk series) 11/12/2019   MAMMOGRAM  03/29/2021   DEXA SCAN  06/26/2022   Influenza Vaccine  06/06/2024 (Originally 10/08/2023)   Medicare Annual Wellness (AWV)  11/11/2024   DTaP/Tdap/Td (3 - Td or Tdap) 09/22/2029   Pneumococcal Vaccine: 50+ Years  Completed   HPV VACCINES  Aged Out   Meningococcal B Vaccine  Aged Out   Colonoscopy  Discontinued    Health Maintenance  Health Maintenance Due  Topic Date Due   Hepatitis C Screening  Never done   Zoster Vaccines- Shingrix (1 of 2) 02/26/1963   COVID-19 Vaccine (2 - Moderna risk series) 11/12/2019   MAMMOGRAM  03/29/2021   DEXA SCAN  06/26/2022   Health Maintenance Items Addressed: Patient declines vaccines at this time; would like to discuss dexa with pcp before consenting   Additional Screening:  Vision Screening: Recommended annual ophthalmology exams for early detection of glaucoma and other disorders of the eye. Would you like a referral to an eye doctor? No    Dental Screening: Recommended annual dental exams for proper oral hygiene  Community Resource Referral / Chronic Care Management: CRR required this visit?  No   CCM required this visit?  No   Plan:    I have personally reviewed and noted the following in the patient's chart:    Medical and social history Use of alcohol , tobacco or illicit drugs  Current medications and supplements including opioid prescriptions. Patient is currently taking opioid prescriptions. Information provided to patient regarding non-opioid alternatives. Patient advised to discuss non-opioid treatment plan with their provider. Functional ability and status Nutritional status Physical activity Advanced directives List of other physicians Hospitalizations, surgeries, and ER visits in previous 12 months Vitals Screenings to include cognitive, depression, and falls Referrals and appointments  In addition, I have reviewed and discussed with patient certain preventive protocols, quality metrics, and best practice recommendations. A written personalized care plan for preventive services as well as general preventive health recommendations were provided to patient.   Lavelle Pfeiffer  Clydell, LPN   [id number]   After Visit Summary: (MyChart) Due to this being a telephonic visit, the after visit summary with patients personalized plan was offered to patient via MyChart   Notes: PCP Follow Up Recommendations: Patient is unable to see neurosurgeon until September 2026.  Would like to discuss this at next office visit

## 2023-11-12 NOTE — Patient Instructions (Addendum)
 Jenna Contreras,  Thank you for taking the time for your Medicare Wellness Visit. I appreciate your continued commitment to your health goals. Please review the care plan we discussed, and feel free to reach out if I can assist you further.  Medicare recommends these wellness visits once per year to help you and your care team stay ahead of potential health issues. These visits are designed to focus on prevention, allowing your provider to concentrate on managing your acute and chronic conditions during your regular appointments.  Please note that Annual Wellness Visits do not include a physical exam. Some assessments may be limited, especially if the visit was conducted virtually. If needed, we may recommend a separate in-person follow-up with your provider.  Ongoing Care Seeing your primary care provider every 3 to 6 months helps us  monitor your health and provide consistent, personalized care.   Referrals If a referral was made during today's visit and you haven't received any updates within two weeks, please contact the referred provider directly to check on the status.  Recommended Screenings:  Health Maintenance  Topic Date Due   Hepatitis C Screening  Never done   Zoster (Shingles) Vaccine (1 of 2) 02/26/1963   COVID-19 Vaccine (2 - Moderna risk series) 11/12/2019   Mammogram  03/29/2021   DEXA scan (bone density measurement)  06/26/2022   Flu Shot  06/06/2024*   Medicare Annual Wellness Visit  11/11/2024   DTaP/Tdap/Td vaccine (3 - Td or Tdap) 09/22/2029   Pneumococcal Vaccine for age over 3  Completed   HPV Vaccine  Aged Out   Meningitis B Vaccine  Aged Out   Colon Cancer Screening  Discontinued  *Topic was postponed. The date shown is not the original due date.       11/12/2023   10:51 AM  Advanced Directives  Does Patient Have a Medical Advance Directive? No  Would patient like information on creating a medical advance directive? Yes (MAU/Ambulatory/Procedural  Areas - Information given)   Advance Care Planning is important because it: Ensures you receive medical care that aligns with your values, goals, and preferences. Provides guidance to your family and loved ones, reducing the emotional burden of decision-making during critical moments.  Information on Advanced Care Planning can be found at [place]  Secretary of [facility] Advance Health Care Directives Advance Health Care Directives ([email or web address]    Vision: Annual vision screenings are recommended for early detection of glaucoma, cataracts, and diabetic retinopathy. These exams can also reveal signs of chronic conditions such as diabetes and high blood pressure.  Dental: Annual dental screenings help detect early signs of oral cancer, gum disease, and other conditions linked to overall health, including heart disease and diabetes.  Please see the attached documents for additional preventive care recommendations.

## 2023-11-16 DIAGNOSIS — Z981 Arthrodesis status: Secondary | ICD-10-CM | POA: Diagnosis not present

## 2023-11-16 DIAGNOSIS — R200 Anesthesia of skin: Secondary | ICD-10-CM | POA: Diagnosis not present

## 2023-11-16 DIAGNOSIS — M40294 Other kyphosis, thoracic region: Secondary | ICD-10-CM | POA: Diagnosis not present

## 2023-11-16 DIAGNOSIS — M4125 Other idiopathic scoliosis, thoracolumbar region: Secondary | ICD-10-CM | POA: Diagnosis not present

## 2023-11-16 DIAGNOSIS — Z9889 Other specified postprocedural states: Secondary | ICD-10-CM | POA: Diagnosis not present

## 2023-11-22 DIAGNOSIS — M5416 Radiculopathy, lumbar region: Secondary | ICD-10-CM | POA: Diagnosis not present

## 2023-12-10 DIAGNOSIS — Z9889 Other specified postprocedural states: Secondary | ICD-10-CM | POA: Diagnosis not present

## 2023-12-10 DIAGNOSIS — Z981 Arthrodesis status: Secondary | ICD-10-CM | POA: Diagnosis not present

## 2023-12-10 DIAGNOSIS — J9311 Primary spontaneous pneumothorax: Secondary | ICD-10-CM | POA: Diagnosis not present

## 2023-12-10 DIAGNOSIS — M4125 Other idiopathic scoliosis, thoracolumbar region: Secondary | ICD-10-CM | POA: Diagnosis not present

## 2023-12-10 DIAGNOSIS — M5410 Radiculopathy, site unspecified: Secondary | ICD-10-CM | POA: Diagnosis not present

## 2023-12-10 DIAGNOSIS — R200 Anesthesia of skin: Secondary | ICD-10-CM | POA: Diagnosis not present

## 2023-12-16 DIAGNOSIS — M5459 Other low back pain: Secondary | ICD-10-CM | POA: Diagnosis not present

## 2023-12-16 DIAGNOSIS — Z9889 Other specified postprocedural states: Secondary | ICD-10-CM | POA: Diagnosis not present

## 2023-12-16 DIAGNOSIS — I890 Lymphedema, not elsewhere classified: Secondary | ICD-10-CM | POA: Diagnosis not present

## 2023-12-16 DIAGNOSIS — T8484XA Pain due to internal orthopedic prosthetic devices, implants and grafts, initial encounter: Secondary | ICD-10-CM | POA: Diagnosis not present

## 2023-12-16 DIAGNOSIS — R200 Anesthesia of skin: Secondary | ICD-10-CM | POA: Diagnosis not present

## 2023-12-16 NOTE — Progress Notes (Signed)
 In general, would you say your health is:  very good  In general, would you say your quality of life is: fair  In general, how would you rate your physical health?  fair  In general, how would you rate your mental health, including your mood and your ability to think?  fair  In general, how would you rate your satisfaction with your social activities and relationships? fair  In general, please rate how well you carry out your usual social activities and roles. 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 A little  In the past 7 days. . .  How often have you been bothered by emotional problems such as feeling anxious, depressed or irritable?  Sometimes  How would you rate your fatigue on average?  Severe  How would you rate your pain on average?  8

## 2023-12-27 DIAGNOSIS — I708 Atherosclerosis of other arteries: Secondary | ICD-10-CM | POA: Diagnosis not present

## 2023-12-27 DIAGNOSIS — M47816 Spondylosis without myelopathy or radiculopathy, lumbar region: Secondary | ICD-10-CM | POA: Diagnosis not present

## 2023-12-27 DIAGNOSIS — M419 Scoliosis, unspecified: Secondary | ICD-10-CM | POA: Diagnosis not present

## 2023-12-27 DIAGNOSIS — M8580 Other specified disorders of bone density and structure, unspecified site: Secondary | ICD-10-CM | POA: Diagnosis not present

## 2024-01-05 ENCOUNTER — Ambulatory Visit (INDEPENDENT_AMBULATORY_CARE_PROVIDER_SITE_OTHER): Admitting: Family Medicine

## 2024-01-05 ENCOUNTER — Encounter: Payer: Self-pay | Admitting: Family Medicine

## 2024-01-05 VITALS — BP 98/64 | HR 75 | Temp 98.4°F | Ht 61.0 in | Wt 126.0 lb

## 2024-01-05 DIAGNOSIS — E039 Hypothyroidism, unspecified: Secondary | ICD-10-CM

## 2024-01-05 DIAGNOSIS — Z780 Asymptomatic menopausal state: Secondary | ICD-10-CM

## 2024-01-05 DIAGNOSIS — Z79891 Long term (current) use of opiate analgesic: Secondary | ICD-10-CM | POA: Diagnosis not present

## 2024-01-05 DIAGNOSIS — Z79899 Other long term (current) drug therapy: Secondary | ICD-10-CM

## 2024-01-05 DIAGNOSIS — M8580 Other specified disorders of bone density and structure, unspecified site: Secondary | ICD-10-CM

## 2024-01-05 DIAGNOSIS — E785 Hyperlipidemia, unspecified: Secondary | ICD-10-CM

## 2024-01-05 DIAGNOSIS — G5793 Unspecified mononeuropathy of bilateral lower limbs: Secondary | ICD-10-CM | POA: Diagnosis not present

## 2024-01-05 DIAGNOSIS — I700 Atherosclerosis of aorta: Secondary | ICD-10-CM

## 2024-01-05 MED ORDER — OXYCODONE HCL 10 MG PO TABS: ORAL_TABLET | ORAL | 0 refills | Status: DC

## 2024-01-05 NOTE — Progress Notes (Signed)
 Subjective:    Patient ID: Jenna Contreras, female    DOB: Nov 23, 1945, 78 y.o.   MRN: [id number]  HPI  Pain management and adjusting medication to w/o acetaminophen  Letter for attorney with signature- regarding surgery  This patient was seen today for chronic pain  The medication list was reviewed and updated.   Location of Pain for which the patient has been treated with regarding narcotics: Back pain  Onset of this pain: Present for years   -Compliance with medication: Patient takes anywhere between 1 or 2 tablets/day her pain is severe  - Number patient states they take daily: See above  -Reason for ongoing use of opioids does not get adequate relief with surgery Tylenol  and NSAIDs  What other measures have been tried outside of opioids surgery Tylenol  NSAIDs gabapentin   In the ongoing specialists regarding this condition back specialist currently seen  -when was the last dose patient took?  Past 24 hours  The patient was advised the importance of maintaining medication and not using illegal substances with these.  Here for refills and follow up  The patient was educated that we can provide 3 monthly scripts for their medication, it is their responsibility to follow the instructions.  Side effects or complications from medications: Denies side effects  Patient is aware that pain medications are meant to minimize the severity of the pain to allow their pain levels to improve to allow for better function. They are aware of that pain medications cannot totally remove their pain.  Due for UDT ( at least once per year) (pain management contract is also completed at the time of the UDT): January 2025 9-10  Scale of 1 to 10 ( 1 is least 10 is most) Your pain level without the medicine: 9-10 Your pain level with medication 9-10  Scale 1 to 10 ( 1-helps very little, 10 helps very well) How well does your pain medication reduce your pain so you can function better  through out the day?  1-2  Quality of the pain: Throbbing aching  Persistence of the pain: Present all time  Modifying factors: Worse with activity  Discussed the use of AI scribe software for clinical note transcription with the patient, who gave verbal consent to proceed.  History of Present Illness   Jenna Contreras is a 78 year old female with scoliosis and previous back surgeries who presents with numbness and weakness in her right leg.  She experiences numbness and weakness in her right leg while walking, which resolves afterward. A CT scan revealed scoliosis, diffuse spondylosis, and evidence of previous surgeries with intact rods and screws. Bulging discs, osteopenia, and atherosclerosis were also noted. She has a history of back surgeries, including the placement of rods and screws, and is concerned about the potential removal of lower rods due to worsening lymphedema. Lymphedema is present in her abdomen and thighs, with fluid accumulation down to her knees. She is apprehensive about the removal of screws from iliac nodules, fearing increased lymphatic fluid buildup.  Her pain is severe, rated 9-10 out of 10, and is constant, worsening with activity. She uses a TENS unit for pain management and has been prescribed oxycodone , but often refrains from taking it due to concerns about liver damage and dementia.  She has a history of high cholesterol, with a recent LDL level of 95. She practices intermittent fasting and is considering dietary changes to manage her cholesterol levels. She is hesitant about taking cholesterol-lowering medications due to concerns about side effects.  Her daughter has recently been diagnosed with breast cancer and underwent a radical mastectomy. She has a history of lung nodules, which have been stable and considered benign.         Review of Systems     Objective:   Physical Exam  General-in no acute distress Eyes-no discharge Lungs-respiratory  rate normal, CTA CV-no murmurs,RRR Extremities skin warm dry no edema Neuro grossly normal Behavior normal, alert Patient wearing TENS unit Evidence of previous back surgery noted Restricted range of motion due to previous surgery     Assessment & Plan:  1. Osteopenia, unspecified location (Primary) Check bone density healthy diet - DG Bone Density  2. Hypothyroidism, unspecified type Continue medication check TSH - TSH  3. Aortic atherosclerosis This was seen on a recent CT scan recommend keeping LDL below 70 if possible lipid profile ordered  4. Neuropathic pain of both legs Seeing a specialist currently and they are proposing possible surgery this could be tricky given her chronic pain and her previous surgery  5. Mild hyperlipidemia Check lab work await results possibility of statin - Lipid Profile  6. Encounter for long-term opiate analgesic use The patient was seen in followup for chronic pain. A review over at their current pain status was discussed. Drug registry was checked. Prescriptions were given.  Regular follow-up recommended. Discussion was held regarding the importance of compliance with medication as well as pain medication contract.  Patient was informed that medication may cause drowsiness and should not be combined  with other medications/alcohol  or street drugs. If the patient feels medication is causing altered alertness then do not drive or operate dangerous equipment.  Should be noted that the patient appears to be meeting appropriate use of opioids and response.  Evidenced by improved function and decent pain control without significant side effects and no evidence of overt aberrancy issues.  Upon discussion with the patient today they understand that opioid therapy is optional and they feel that the pain has been refractory to reasonable conservative measures and is significant and affecting quality of life enough to warrant ongoing therapy and wishes to  continue opioids.  Refills were provided.  [place]  medical Board guidelines regarding the pain medicine has been reviewed.  CDC guidelines most updated 2022 has been reviewed by the prescriber.  PDMP is checked on a regular basis yearly urine drug screen and pain management contract  Treatment plan for this patient includes #1-gentle stretching exercises as shown daily basis 2.  Mild strength exercises 3 times per week #3 continue pain medications #4 notify us  if any digression  We switched over to plain oxycodone  because she states she use heard that acetaminophen  causes dementia I tried to reassure her on that I recommend the pain medicine be taken 2 or 3 times daily as needed if she needs higher doses than that we will need to refer her to pain management 7. High risk medication use Lab ordered - Basic Metabolic Panel (BMET)  8. Post-menopausal Bone density ordered - DG Bone Density  Follow-up 3 months

## 2024-01-06 ENCOUNTER — Ambulatory Visit: Payer: Self-pay | Admitting: Family Medicine

## 2024-01-06 DIAGNOSIS — R102 Pelvic and perineal pain unspecified side: Secondary | ICD-10-CM | POA: Diagnosis not present

## 2024-01-06 DIAGNOSIS — Z9889 Other specified postprocedural states: Secondary | ICD-10-CM | POA: Diagnosis not present

## 2024-01-06 DIAGNOSIS — R200 Anesthesia of skin: Secondary | ICD-10-CM | POA: Diagnosis not present

## 2024-01-06 DIAGNOSIS — G8929 Other chronic pain: Secondary | ICD-10-CM | POA: Diagnosis not present

## 2024-01-06 DIAGNOSIS — Z981 Arthrodesis status: Secondary | ICD-10-CM | POA: Diagnosis not present

## 2024-01-06 DIAGNOSIS — R1023 Pelvic and perineal pain bilateral: Secondary | ICD-10-CM | POA: Diagnosis not present

## 2024-01-06 LAB — LIPID PANEL
Chol/HDL Ratio: 2 ratio (ref 0.0–4.4)
Cholesterol, Total: 206 mg/dL — ABNORMAL HIGH (ref 100–199)
HDL: 101 mg/dL (ref >39)
LDL Chol Calc (NIH): 96 mg/dL (ref 0–99)
Triglycerides: 48 mg/dL (ref 0–149)
VLDL Cholesterol Cal: 9 mg/dL (ref 5–40)

## 2024-01-06 LAB — BASIC METABOLIC PANEL WITH GFR
BUN/Creatinine Ratio: 27 (ref 12–28)
BUN: 18 mg/dL (ref 8–27)
CO2: 23 mmol/L (ref 20–29)
Calcium: 9.8 mg/dL (ref 8.7–10.3)
Chloride: 102 mmol/L (ref 96–106)
Creatinine, Ser: 0.67 mg/dL (ref 0.57–1.00)
Glucose: 83 mg/dL (ref 70–99)
Potassium: 4.5 mmol/L (ref 3.5–5.2)
Sodium: 139 mmol/L (ref 134–144)
eGFR: 89 mL/min/1.73 (ref >59)

## 2024-01-06 LAB — TSH: TSH: 1.69 u[IU]/mL (ref 0.450–4.500)

## 2024-01-07 ENCOUNTER — Ambulatory Visit: Admitting: Family Medicine

## 2024-01-13 DIAGNOSIS — M8580 Other specified disorders of bone density and structure, unspecified site: Secondary | ICD-10-CM | POA: Diagnosis not present

## 2024-01-13 DIAGNOSIS — Z9889 Other specified postprocedural states: Secondary | ICD-10-CM | POA: Diagnosis not present

## 2024-01-13 DIAGNOSIS — M4327 Fusion of spine, lumbosacral region: Secondary | ICD-10-CM | POA: Diagnosis not present

## 2024-01-14 ENCOUNTER — Other Ambulatory Visit (HOSPITAL_COMMUNITY)

## 2024-01-20 ENCOUNTER — Ambulatory Visit (HOSPITAL_COMMUNITY)
Admission: RE | Admit: 2024-01-20 | Discharge: 2024-01-20 | Disposition: A | Discharge status: Home / Self Care | Source: Ambulatory Visit | Attending: Family Medicine | Admitting: Family Medicine

## 2024-01-20 DIAGNOSIS — M810 Age-related osteoporosis without current pathological fracture: Secondary | ICD-10-CM | POA: Diagnosis not present

## 2024-01-20 DIAGNOSIS — M8580 Other specified disorders of bone density and structure, unspecified site: Secondary | ICD-10-CM | POA: Insufficient documentation

## 2024-01-20 DIAGNOSIS — Z780 Asymptomatic menopausal state: Secondary | ICD-10-CM | POA: Insufficient documentation

## 2024-01-25 DIAGNOSIS — H16143 Punctate keratitis, bilateral: Secondary | ICD-10-CM | POA: Diagnosis not present

## 2024-01-25 DIAGNOSIS — H18513 Endothelial corneal dystrophy, bilateral: Secondary | ICD-10-CM | POA: Diagnosis not present

## 2024-01-25 DIAGNOSIS — Z961 Presence of intraocular lens: Secondary | ICD-10-CM | POA: Diagnosis not present

## 2024-01-25 DIAGNOSIS — H04123 Dry eye syndrome of bilateral lacrimal glands: Secondary | ICD-10-CM | POA: Diagnosis not present

## 2024-01-25 DIAGNOSIS — H43393 Other vitreous opacities, bilateral: Secondary | ICD-10-CM | POA: Diagnosis not present

## 2024-01-25 NOTE — Progress Notes (Signed)
 Called patient and left message stating to give us  a call back regarding her results.

## 2024-02-15 ENCOUNTER — Telehealth: Payer: Self-pay | Admitting: *Deleted

## 2024-02-15 NOTE — Telephone Encounter (Signed)
 Copied from CRM [id number]. Topic: Appointments - Appointment Scheduling >> Feb 14, 2024  2:46 PM Franky GRADE wrote: Patient/patient representative is calling to schedule an appointment. Refer to attachments for appointment information.  Patient received a letter for schedule a follow up with Dr.Luking after bone density, advised patient she has an appointment scheduled for 04/13/2024; however, patient would like to know if Dr.Luking thinks it's okay to wait that long or would he like to see her sooner. Patient has been added to the wait list.

## 2024-02-15 NOTE — Telephone Encounter (Signed)
 Autumn I imagine my days are quite full If there is the ability you to utilize a same-day slot in January that is fine Her other choice would be to be seen by one of the other providers (which she probably does not want to do) Otherwise February appointment

## 2024-04-11 NOTE — Progress Notes (Unsigned)
 " Cardiology Office Note   Date:  04/12/2024  ID:  Jenna Contreras, DOB Jun 22, 1945, MRN [id number] PCP: Alphonsa Glendia LABOR, MD  [facility] Providers Cardiologist:  Gordy Bergamo, MD    History of Present Illness Jenna Contreras is a 79 y.o. female with past medical history of hyperlipidemia, COPD and asthma, former tobacco use, hypothyroidism, depression, anxiety, vitamin D  deficiency, chronic pain, coronary artery disease noted on CT scan.  Presents today for an overdue follow-up appointment.  Patient previously wore cardiac monitor in 04/2021 that showed predominantly normal sinus rhythm, occasional PACs and PVCs, brief atrial tachycardia episodes.  Nuclear stress test in 04/2021 was a low risk study.  Echocardiogram in 04/2021 showed EF 60-65%, normal global wall motion, normal diastolic filling pattern, no significant valvular disease.  Patient has not been seen by cardiology since 04/2021  Today, patient presents for evaluation of chest pain.  Tells me that she had last seen Dr. Bergamo in 04/2021.  She was not sure that she needed to make a follow-up appointment.  She has been doing well overall.  However, with this weekend storm she started to have episodes of chest pain.  Tells me that when it is noted she went outside to shovel the snow.  When exerting herself, she did not have chest pain or shortness of breath.  The next day, she was sitting at rest when she noticed a squeezing sensation in the middle of her chest.  She also felt like she was gasping for breath.  She decided to call the office to try to make an appointment as the symptoms were concerning to her.  She has not had recurrence of chest pain or shortness of breath since.  She denies dizziness, syncope, near syncope, lower extremity swelling.  She has occasional palpitations.  She is hesitant to start cholesterol medications due to concerns that they can cause dementia. She is also worried about having side effects. She  continues to follow with her PCP regularly    Studies Reviewed EKG Interpretation Date/Time:  Wednesday April 12 2024 15:51:08 EST Ventricular Rate:  63 PR Interval:  156 QRS Duration:  80 QT Interval:  412 QTC Calculation: 421 R Axis:   22  Text Interpretation: Normal sinus rhythm with sinus arrhythmia Normal ECG When compared with ECG of 03-Mar-2021 18:38, PREVIOUS ECG IS PRESENT Confirmed by Vicci Sauer [phone]) on 04/12/2024 4:05:26 PM    Cardiac Studies & Procedures   ______________________________________________________________________________________________   STRESS TESTS  PCV MYOCARDIAL PERFUSION WITH LEXISCAN  04/15/2021  Interpretation Summary Lexiscan  Tetrofosmin  stress test 04/15/2020: Lexiscan  nuclear stress test performed using *-day protocol. Stress EKG is non-diagnostic, as this is pharmacological stress test using Lexiscan . Normal myocardial perfusion without convincing evidence of reversible myocardial ischemia or prior infarct. Normal wall thickness, no regional wall motion abnormalities, and visually hyperdynamic LVEF. Calculated LVEF 76%. Low risk study. No prior studies available for comparison.   ECHOCARDIOGRAM  PCV ECHOCARDIOGRAM COMPLETE 04/15/2021  Narrative Echocardiogram 04/15/2021: Normal LV systolic function with visual EF 60-65%. Left ventricle cavity is normal in size. Normal left ventricular wall thickness. Normal global wall motion. Normal diastolic filling pattern, normal LAP. No significant valvular heart disease. No prior study for comparison.    MONITORS  LONG TERM MONITOR (3-14 DAYS) 04/09/2021  Narrative Zio Patch Extended out patient EKG monitoring 12 days starting 03/21/2021: Predominant rhythm is normal sinus rhythm.  Minimum heart rate 47, maximum heart rate 169 bpm with average heart rate of 72 bpm. The brief atrial tachycardia  episodes, longest 12 beats.  Occasional PACs and PVCs were also noted. There is no atrial  fibrillation or heart block.  Patient triggered events correlated with occasional PACs and PVCs.       ______________________________________________________________________________________________      Risk Assessment/Calculations           Physical Exam VS:  BP 98/64   Pulse 63   Ht 5' 1 (1.549 m)   Wt 128 lb (58.1 kg)   SpO2 96%   BMI 24.19 kg/m        Wt Readings from Last 3 Encounters:  04/12/24 128 lb (58.1 kg)  01/05/24 126 lb (57.2 kg)  11/12/23 122 lb (55.3 kg)    GEN: Well nourished, well developed in no acute distress. Sitting comfortably on the exam table  NECK: No JVD  CARDIAC:  RRR, no murmurs, rubs, gallops. Radial pulses 2+ bilaterally  RESPIRATORY:  Clear to auscultation without rales, wheezing or rhonchi. Normal WOB on room air   ABDOMEN: Soft, non-tender, non-distended EXTREMITIES:  No edema; No deformity   ASSESSMENT AND PLAN  Coronary calcifications on CT scan Chest pain  Hyperlipidemia - Previous stress test in 2023 was low risk - Patient presents today for evaluation of chest pain. She had shoveled snow this weekend, and tolerated the activity well. The next morning she was sitting at rest when she felt a squeezing feeling in her chest and shortness of breath. She has not had recurrent chest pain or SOB since - EKG today showed NSR with sinus arrhythmia. No ischemic changes  - Patient has a family history of CAD. Previously smoked (though has not smoked in many years). Has history of HLD  - Ordered coronary CTA  - Ordered BMP prior to scan  - Lipid panel in 12/2023 showed LDL 96, HDL 101, triglycerides 48, total cholesterol 206. Patient is hesitant to start cholesterol medications as she is concerned about side effects. Also concerned that these medications can contribute to dementia. She does have several medication intolerances. For now, OK to continue to manage with diet. If she has abnormal findings on coronary CTA, will reassess. - Echo in  04/2021 showed EF 60-65%, no wall motion abnormalities, normal diastolic filling pattern, no heart disease     Palpitations - Previous monitor in 2023 showed occasional PACs and PVCs, brief atrial tachycardia - Resting HR too low for AV nodal medications   Dispo: Follow up with Dr. Ladona in 6 months   Signed, Rollo FABIENE Louder, PA-C   "

## 2024-04-12 ENCOUNTER — Ambulatory Visit: Admitting: Cardiology

## 2024-04-12 ENCOUNTER — Encounter: Payer: Self-pay | Admitting: Cardiology

## 2024-04-12 VITALS — BP 98/64 | HR 63 | Ht 61.0 in | Wt 128.0 lb

## 2024-04-12 DIAGNOSIS — I2510 Atherosclerotic heart disease of native coronary artery without angina pectoris: Secondary | ICD-10-CM

## 2024-04-12 DIAGNOSIS — R072 Precordial pain: Secondary | ICD-10-CM

## 2024-04-12 DIAGNOSIS — Z79899 Other long term (current) drug therapy: Secondary | ICD-10-CM | POA: Diagnosis not present

## 2024-04-12 MED ORDER — METOPROLOL TARTRATE 25 MG PO TABS: ORAL_TABLET | ORAL | 0 refills | Status: AC

## 2024-04-12 NOTE — Patient Instructions (Signed)
 Medication Instructions:  Metoprolol  25mg  2 hours before test.  *If you need a refill on your cardiac medications before your next appointment, please call your pharmacy*  Lab Work: Today BMET If you have labs (blood work) drawn today and your tests are completely normal, you will receive your results only by: MyChart Message (if you have MyChart) OR A paper copy in the mail If you have any lab test that is abnormal or we need to change your treatment, we will call you to review the results.  Testing/Procedures: Coronary CTA   Your cardiac CT will be scheduled at one of the below locations:   [facility] [address] [place], [place] [address] [phone]  OR  [facility] [address] [place], [place] [address] [phone]  OR   [facility] [address] [place], [place] [address] [phone]  If scheduled at [facility], please arrive at the [facility] (Entrance C2) of [facility] 30 minutes prior to test start time. You can use the FREE valet parking offered at entrance C (encouraged to control the heart rate for the test)  Proceed to the [facility] Radiology Department (first floor) to check-in and test prep.  All radiology patients and guests should use entrance C2 at [facility], accessed from [facility], even though the hospital's physical address listed is [address]    If scheduled at [facility] or [facility], please arrive 15 mins early for check-in and test prep.  There is spacious parking and easy access to the radiology department from the [facility] entrance. Please enter here and check-in with the desk attendant.   Please follow these instructions carefully (unless otherwise directed):  An IV will be required for this test and  Nitroglycerin  will be given.  Hold all erectile dysfunction medications at least 3 days (72 hrs) prior to test. (Ie viagra, cialis, sildenafil, tadalafil, etc)   On the Night Before the Test: Be sure to Drink plenty of water . Do not consume any caffeinated/decaffeinated beverages or chocolate 12 hours prior to your test. Do not take any antihistamines 12 hours prior to your test. If the patient has contrast allergy: Patient will need a prescription for Prednisone  and very clear instructions (as follows): Prednisone  50 mg - take 13 hours prior to test Take another Prednisone  50 mg 7 hours prior to test Take another Prednisone  50 mg 1 hour prior to test Take Benadryl  50 mg 1 hour prior to test Patient must complete all four doses of above prophylactic medications. Patient will need a ride after test due to Benadryl .  On the Day of the Test: Drink plenty of water  until 1 hour prior to the test. Do not eat any food 1 hour prior to test. You may take your regular medications prior to the test.  Take metoprolol  (Lopressor ) two hours prior to test. If you take Furosemide/Hydrochlorothiazide/Spironolactone, please HOLD on the morning of the test. FEMALES- please wear underwire-free bra if available, avoid dresses & tight clothing       After the Test: Drink plenty of water . After receiving IV contrast, you may experience a mild flushed feeling. This is normal. On occasion, you may experience a mild rash up to 24 hours after the test. This is not dangerous. If this occurs, you can take Benadryl  25 mg and increase your fluid intake. If  you experience trouble breathing, this can be serious. If it is severe call 911 IMMEDIATELY. If it is mild, please call our office. If you take any of these medications: Glipizide/Metformin, Avandament, Glucavance, please do not take 48 hours after completing test unless otherwise instructed.  We will call to schedule your test 2-4 weeks out understanding that  some insurance companies will need an authorization prior to the service being performed.   For more information and frequently asked questions, please visit our website : [email or web address]  For non-scheduling related questions, please contact the cardiac imaging nurse navigator should you have any questions/concerns: Cardiac Imaging Nurse Navigators Direct Office Dial: [phone]   For scheduling needs, including cancellations and rescheduling, please call Brittany, [phone].   Follow-Up: At [facility], you and your health needs are our priority.  As part of our continuing mission to provide you with exceptional heart care, our providers are all part of one team.  This team includes your primary Cardiologist (physician) and Advanced Practice Providers or APPs (Physician Assistants and Nurse Practitioners) who all work together to provide you with the care you need, when you need it.  Your next appointment:   6 month(s)  Provider:   Gordy Bergamo, MD     Other Instructions None

## 2024-04-13 ENCOUNTER — Ambulatory Visit: Payer: Self-pay | Admitting: Cardiology

## 2024-04-13 ENCOUNTER — Other Ambulatory Visit: Payer: Self-pay

## 2024-04-13 ENCOUNTER — Ambulatory Visit: Admitting: Family Medicine

## 2024-04-13 VITALS — BP 106/59 | HR 87 | Ht 61.0 in | Wt 126.0 lb

## 2024-04-13 DIAGNOSIS — Z79899 Other long term (current) drug therapy: Secondary | ICD-10-CM

## 2024-04-13 DIAGNOSIS — G8929 Other chronic pain: Secondary | ICD-10-CM

## 2024-04-13 DIAGNOSIS — E039 Hypothyroidism, unspecified: Secondary | ICD-10-CM

## 2024-04-13 DIAGNOSIS — R252 Cramp and spasm: Secondary | ICD-10-CM

## 2024-04-13 DIAGNOSIS — Z79891 Long term (current) use of opiate analgesic: Secondary | ICD-10-CM

## 2024-04-13 DIAGNOSIS — M810 Age-related osteoporosis without current pathological fracture: Secondary | ICD-10-CM

## 2024-04-13 LAB — BASIC METABOLIC PANEL WITH GFR
BUN/Creatinine Ratio: 33 — ABNORMAL HIGH (ref 12–28)
BUN: 26 mg/dL (ref 8–27)
CO2: 22 mmol/L (ref 20–29)
Calcium: 9.9 mg/dL (ref 8.7–10.3)
Chloride: 101 mmol/L (ref 96–106)
Creatinine, Ser: 0.79 mg/dL (ref 0.57–1.00)
Glucose: 80 mg/dL (ref 70–99)
Potassium: 4.4 mmol/L (ref 3.5–5.2)
Sodium: 140 mmol/L (ref 134–144)
eGFR: 76 mL/min/{1.73_m2}

## 2024-04-13 LAB — MED LIST OPTION NOT SELECTED

## 2024-04-13 LAB — TOXASSURE SELECT 13 (MW), URINE

## 2024-04-13 LAB — SPECIMEN STATUS REPORT

## 2024-04-13 MED ORDER — ALENDRONATE SODIUM 70 MG PO TABS: 70.0000 mg | ORAL_TABLET | ORAL | 3 refills | Status: AC

## 2024-04-13 MED ORDER — LEVOTHYROXINE SODIUM 75 MCG PO TABS: 75.0000 ug | ORAL_TABLET | Freq: Every day | ORAL | 3 refills | Status: AC

## 2024-04-13 MED ORDER — OXYCODONE HCL 10 MG PO TABS: ORAL_TABLET | ORAL | 0 refills | Status: AC

## 2024-04-13 MED ORDER — CHLORZOXAZONE 500 MG PO TABS: ORAL_TABLET | ORAL | 0 refills | Status: AC

## 2024-04-13 MED ORDER — HYDROXYZINE HCL 25 MG PO TABS: 25.0000 mg | ORAL_TABLET | ORAL | 0 refills | Status: AC | PRN

## 2024-04-13 NOTE — Progress Notes (Signed)
 "  Subjective:    Patient ID: Jenna Contreras, female    DOB: 1945-04-08, 79 y.o.   MRN: [id number]  HPI  3 month follow up  Refills and discuss bone density results Patient has osteoporosis by bone density She is not on any medications currently Patient does have chronic back pain she has had previous surgery Tylenol  NSAIDs not effective She uses oxycodone  2 or 3 times per day She has been on this for years Additional surgery not indicated Patient occasionally uses muscle relaxer but never with oxycodone  She states she does not drive when she takes her pain medicine PDMP was checked Urine drug screen today She states without the pain medicine her pain can sometimes become a 8 or 9 with the medication it gets down to a 5 or 6 he does allow her to stay more functional  She recently saw cardiology because of chest pain because of all this she was evaluated by cardiology and they recommended a CT scan she is uncertain if she wants to do the test we discussed pluses and minuses and I have encouraged her to do the test because of her exertional chest pain with shoveling ice  Does have hypothyroidism and is on medication Not due for labs today but we will do labs in the spring time Note from cardiology and lab work from cardiology reviewed as well as EKG Review of Systems     Objective:   Physical Exam  General-in no acute distress Eyes-no discharge Lungs-respiratory rate normal, CTA CV-no murmurs,RRR Extremities skin warm dry no edema Neuro grossly normal Behavior normal, alert       Assessment & Plan:  1. Chronic bilateral thoracic back pain (Primary) The patient was seen in followup for chronic pain. A review over at their current pain status was discussed. Drug registry was checked. Prescriptions were given.  Regular follow-up recommended. Discussion was held regarding the importance of compliance with medication as well as pain medication contract.  Patient was  informed that medication may cause drowsiness and should not be combined  with other medications/alcohol  or street drugs. If the patient feels medication is causing altered alertness then do not drive or operate dangerous equipment.  Should be noted that the patient appears to be meeting appropriate use of opioids and response.  Evidenced by improved function and decent pain control without significant side effects and no evidence of overt aberrancy issues.  Upon discussion with the patient today they understand that opioid therapy is optional and they feel that the pain has been refractory to reasonable conservative measures and is significant and affecting quality of life enough to warrant ongoing therapy and wishes to continue opioids.  Refills were provided.  [place]  medical Board guidelines regarding the pain medicine has been reviewed.  CDC guidelines most updated 2022 has been reviewed by the prescriber.  PDMP is checked on a regular basis yearly urine drug screen and pain management contract  Treatment plan for this patient includes #1-gentle stretching exercises as shown daily basis 2.  Mild strength exercises 3 times per week #3 continue pain medications #4 notify us  if any digression I feel the patient does benefit from her medicine she would like to continue it we will go ahead and send in prescriptions follow-up in 3 months - ToxASSURE Select 13 (MW), Urine  2. Hypothyroidism, unspecified type Continue medication lab work in the spring  3. Muscle cramp Gentle stretching recent electrolytes look good  4. High risk medication use Lab work  in the spring  5. Encounter for long-term opiate analgesic use See above PDMP was checked Pain medicine sent in   6. Age-related osteoporosis without current pathological fracture Recommend Fosamax  this will be sent in as well side effects was discussed if she has side effects stop the medicine  Recent electrolytes look good  "

## 2024-04-28 ENCOUNTER — Ambulatory Visit (HOSPITAL_COMMUNITY)

## 2024-07-25 ENCOUNTER — Ambulatory Visit: Admitting: Family Medicine

## 2024-08-31 ENCOUNTER — Ambulatory Visit: Admitting: Cardiology

## 2024-10-17 ENCOUNTER — Ambulatory Visit: Admitting: Family Medicine

## 2024-11-17 ENCOUNTER — Ambulatory Visit
